# Patient Record
Sex: Male | Born: 1965 | Race: White | Hispanic: No | Marital: Single | Smoking: Former smoker
Health system: Southern US, Community
[De-identification: ages and names within clinical notes are randomized; demographics above are authoritative.]

## PROBLEM LIST (undated history)

## (undated) DIAGNOSIS — R51 Headache: Secondary | ICD-10-CM

## (undated) DIAGNOSIS — M5136 Other intervertebral disc degeneration, lumbar region: Secondary | ICD-10-CM

## (undated) DIAGNOSIS — F909 Attention-deficit hyperactivity disorder, unspecified type: Secondary | ICD-10-CM

## (undated) DIAGNOSIS — K7030 Alcoholic cirrhosis of liver without ascites: Secondary | ICD-10-CM

## (undated) DIAGNOSIS — T7840XA Allergy, unspecified, initial encounter: Secondary | ICD-10-CM

## (undated) DIAGNOSIS — K8020 Calculus of gallbladder without cholecystitis without obstruction: Secondary | ICD-10-CM

## (undated) DIAGNOSIS — E1142 Type 2 diabetes mellitus with diabetic polyneuropathy: Secondary | ICD-10-CM

## (undated) DIAGNOSIS — I10 Essential (primary) hypertension: Secondary | ICD-10-CM

## (undated) DIAGNOSIS — IMO0002 Reserved for concepts with insufficient information to code with codable children: Secondary | ICD-10-CM

## (undated) DIAGNOSIS — M51369 Other intervertebral disc degeneration, lumbar region without mention of lumbar back pain or lower extremity pain: Secondary | ICD-10-CM

## (undated) DIAGNOSIS — R519 Headache, unspecified: Secondary | ICD-10-CM

## (undated) DIAGNOSIS — C801 Malignant (primary) neoplasm, unspecified: Secondary | ICD-10-CM

## (undated) DIAGNOSIS — F329 Major depressive disorder, single episode, unspecified: Secondary | ICD-10-CM

## (undated) DIAGNOSIS — G8929 Other chronic pain: Secondary | ICD-10-CM

## (undated) DIAGNOSIS — F1010 Alcohol abuse, uncomplicated: Secondary | ICD-10-CM

## (undated) DIAGNOSIS — E1165 Type 2 diabetes mellitus with hyperglycemia: Secondary | ICD-10-CM

## (undated) DIAGNOSIS — H3500 Unspecified background retinopathy: Secondary | ICD-10-CM

## (undated) DIAGNOSIS — M4722 Other spondylosis with radiculopathy, cervical region: Secondary | ICD-10-CM

## (undated) DIAGNOSIS — M4810 Ankylosing hyperostosis [Forestier], site unspecified: Secondary | ICD-10-CM

## (undated) DIAGNOSIS — H409 Unspecified glaucoma: Secondary | ICD-10-CM

## (undated) DIAGNOSIS — F411 Generalized anxiety disorder: Secondary | ICD-10-CM

## (undated) HISTORY — PX: KNEE ARTHROSCOPY: SUR90

## (undated) HISTORY — DX: Major depressive disorder, single episode, unspecified: F32.9

## (undated) HISTORY — DX: Unspecified background retinopathy: H35.00

## (undated) HISTORY — DX: Allergy, unspecified, initial encounter: T78.40XA

## (undated) HISTORY — DX: Alcohol abuse, uncomplicated: F10.10

## (undated) HISTORY — DX: Essential (primary) hypertension: I10

## (undated) HISTORY — DX: Generalized anxiety disorder: F41.1

## (undated) HISTORY — DX: Unspecified glaucoma: H40.9

## (undated) HISTORY — DX: Other spondylosis with radiculopathy, cervical region: M47.22

## (undated) HISTORY — DX: Other intervertebral disc degeneration, lumbar region: M51.36

## (undated) HISTORY — DX: Calculus of gallbladder without cholecystitis without obstruction: K80.20

## (undated) HISTORY — DX: Type 2 diabetes mellitus with hyperglycemia: E11.65

## (undated) HISTORY — DX: Type 2 diabetes mellitus with diabetic polyneuropathy: E11.42

## (undated) HISTORY — DX: Other intervertebral disc degeneration, lumbar region without mention of lumbar back pain or lower extremity pain: M51.369

## (undated) HISTORY — DX: Attention-deficit hyperactivity disorder, unspecified type: F90.9

## (undated) HISTORY — DX: Alcoholic cirrhosis of liver without ascites: K70.30

---
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
## 2004-08-27 DIAGNOSIS — Z87820 Personal history of traumatic brain injury: Secondary | ICD-10-CM

## 2004-08-27 HISTORY — DX: Personal history of traumatic brain injury: Z87.820

## 2011-06-30 ENCOUNTER — Emergency Department (HOSPITAL_COMMUNITY): Payer: Self-pay

## 2011-06-30 ENCOUNTER — Emergency Department (HOSPITAL_COMMUNITY)
Admission: EM | Admit: 2011-06-30 | Discharge: 2011-06-30 | Disposition: A | Discharge status: Home / Self Care | Payer: Self-pay | Attending: Emergency Medicine | Admitting: Emergency Medicine

## 2011-06-30 DIAGNOSIS — M79609 Pain in unspecified limb: Secondary | ICD-10-CM | POA: Insufficient documentation

## 2011-06-30 DIAGNOSIS — M25673 Stiffness of unspecified ankle, not elsewhere classified: Secondary | ICD-10-CM | POA: Insufficient documentation

## 2011-06-30 DIAGNOSIS — X500XXA Overexertion from strenuous movement or load, initial encounter: Secondary | ICD-10-CM | POA: Insufficient documentation

## 2011-06-30 DIAGNOSIS — M25676 Stiffness of unspecified foot, not elsewhere classified: Secondary | ICD-10-CM | POA: Insufficient documentation

## 2011-06-30 DIAGNOSIS — S93409A Sprain of unspecified ligament of unspecified ankle, initial encounter: Secondary | ICD-10-CM | POA: Insufficient documentation

## 2011-06-30 DIAGNOSIS — M25579 Pain in unspecified ankle and joints of unspecified foot: Secondary | ICD-10-CM | POA: Insufficient documentation

## 2011-07-16 ENCOUNTER — Emergency Department (INDEPENDENT_AMBULATORY_CARE_PROVIDER_SITE_OTHER): Payer: Self-pay

## 2011-07-16 ENCOUNTER — Emergency Department (INDEPENDENT_AMBULATORY_CARE_PROVIDER_SITE_OTHER)
Admission: EM | Admit: 2011-07-16 | Discharge: 2011-07-16 | Disposition: A | Discharge status: Home / Self Care | Payer: Self-pay | Source: Home / Self Care | Attending: Emergency Medicine | Admitting: Emergency Medicine

## 2011-07-16 DIAGNOSIS — G5630 Lesion of radial nerve, unspecified upper limb: Secondary | ICD-10-CM

## 2011-07-16 DIAGNOSIS — IMO0002 Reserved for concepts with insufficient information to code with codable children: Secondary | ICD-10-CM

## 2011-07-16 DIAGNOSIS — M7580 Other shoulder lesions, unspecified shoulder: Secondary | ICD-10-CM

## 2011-07-16 DIAGNOSIS — M67919 Unspecified disorder of synovium and tendon, unspecified shoulder: Secondary | ICD-10-CM

## 2011-07-16 DIAGNOSIS — M719 Bursopathy, unspecified: Secondary | ICD-10-CM

## 2011-07-16 HISTORY — DX: Other intervertebral disc degeneration, lumbar region: M51.36

## 2011-07-16 HISTORY — DX: Other chronic pain: G89.29

## 2011-07-16 HISTORY — DX: Reserved for concepts with insufficient information to code with codable children: IMO0002

## 2011-07-16 MED ORDER — PREDNISONE 5 MG PO KIT: 1.0000 | PACK | Freq: Every day | ORAL | Status: DC

## 2011-07-16 MED ORDER — HYDROCODONE-ACETAMINOPHEN 5-325 MG PO TABS: ORAL_TABLET | ORAL | Status: AC

## 2011-07-16 MED ORDER — CYCLOBENZAPRINE HCL 5 MG PO TABS: 5.0000 mg | ORAL_TABLET | Freq: Three times a day (TID) | ORAL | Status: AC | PRN

## 2011-07-16 NOTE — ED Notes (Signed)
Patient given discharge instruction by Dr Lorenz Coaster, expressed understanding and had all questions answered.  Pt given RX x 1 and e-prescribed x 2

## 2011-07-16 NOTE — ED Notes (Signed)
Patient has multiple complaints.  He has chronic low back pain and a left hand injury;  Patient states he was arrested 2 1/2 weeks ago and was put into "hinged cuffs"  He has pain and numbness to left hand.  Patient also wants to know why he is having so much pain.  He thinks he has cancer

## 2011-07-16 NOTE — ED Provider Notes (Signed)
History     CSN: [id number] Arrival date & time: 07/16/2011  4:15 PM   First MD Initiated Contact with Patient 07/16/11 1600      Chief Complaint  Patient presents with  . Back Pain  . Hand Injury    numbness    (Consider location/radiation/quality/duration/timing/severity/associated sxs/prior treatment) HPI Comments: Blue presents tonight with multiple musculoskeletal complaints that have been going on for varying times. The first is pain in his left wrist. This occurred after a traffic arrest 2 weeks ago. He was placed in handcuffs for about 30 minutes. Ever since then he's had pain over the radial styloid with numbness and tingling radiating up into the index finger and pain. It's tender to touch. There's been no swelling. It hurts him to move it. And he has diminished strength in that hand.  His second complaint is back pain. He states this involves his entire spine from the base of the skull on down to his pelvis. He's injured his back multiple times. The pain radiates down both legs with numbness and tingling and weakness. About 12-14 weeks ago this flared up again. He usually takes Flexeril and prednisone. He doesn't have a physician to followup with.  His third complaint has been left shoulder pain. This is been going on for 4 weeks. He denies any injury and it hurts with abduction. The pain sometimes radiates down his arm. He also notes crepitus with movement of the shoulder.  Patient is a 45 y.o. male presenting with back pain and hand injury.  Back Pain  Associated symptoms include numbness and weakness.  Hand Injury     Past Medical History  Diagnosis Date  . Chronic pain     back  . Disc degeneration   . Disc degeneration, lumbar     History reviewed. No pertinent past surgical history.  History reviewed. No pertinent family history.  History  Substance Use Topics  . Smoking status: Former Games developer  . Smokeless tobacco: Not on file  . Alcohol Use: Yes       Review of Systems  Musculoskeletal: Positive for back pain and arthralgias. Negative for myalgias, joint swelling and gait problem.  Skin: Negative for rash and wound.  Neurological: Positive for weakness and numbness.    Allergies  Review of patient's allergies indicates not on file.  Home Medications   Current Outpatient Rx  Name Route Sig Dispense Refill  . HYDROCODONE-ACETAMINOPHEN 2.5-500 MG PO TABS Oral Take 1 tablet by mouth every 6 (six) hours as needed.      . IBUPROFEN 800 MG PO TABS Oral Take 800 mg by mouth every 8 (eight) hours as needed.      . CYCLOBENZAPRINE HCL 5 MG PO TABS Oral Take 1 tablet (5 mg total) by mouth 3 (three) times daily as needed for muscle spasms. 30 tablet 0  . HYDROCODONE-ACETAMINOPHEN 5-325 MG PO TABS  1 to 2 tabs every 4 to 6 hours as needed for pain. 20 tablet 0  . PREDNISONE 5 MG PO KIT Oral Take 1 kit (5 mg total) by mouth daily after breakfast. Take as directed. 1 kit 0    BP 157/98  Pulse 91  Temp(Src) 97.8 F (36.6 C) (Oral)  Resp 22  SpO2 98%  Physical Exam  Nursing note and vitals reviewed. Constitutional: He is oriented to person, place, and time. He appears well-developed and well-nourished. No distress.  Musculoskeletal: Normal range of motion. He exhibits tenderness. He exhibits no edema.  Exam of his neck reveals tenderness to palpation over both trapezius ridges. The neck has a full range of motion with pain in all directions.  Exam of the left shoulder reveals no tenderness to palpation. The shoulder has a full range of motion with some crepitus. Impingement signs are positive. Muscle strength is somewhat diminished.  Exam of the wrist reveals pain to palpation over the ulnar styloid and the wrist. The wrist is tender to palpation. Has a diminished range of motion with pain. Muscle strength and sensation are diminished. Pulses are full.  Exam of his back reveals tenderness to palpation up and down the entire  spine and both the table area and the paravertebral muscles. The back has a limited range of motion with pain. Straight leg raising is positive bilaterally. Muscle strengths and DTRs are normal and lower extremities. Sensation is normal in the lower extremities.  Neurological: He is alert and oriented to person, place, and time. He has normal strength and normal reflexes. He displays no atrophy. No sensory deficit. He exhibits normal muscle tone.  Skin: Skin is warm and dry. No rash noted. He is not diaphoretic.    ED Course  Procedures (including critical care time)  Labs Reviewed - No data to display Dg Wrist Complete Left  07/16/2011  *RADIOLOGY REPORT*  Clinical Data: Wrist pain  LEFT WRIST - COMPLETE 3+ VIEW  Comparison: None.  Findings: Normal alignment without fracture.  Intact distal radius, ulna and carpal bones.  No radiographic swelling or foreign body. Preserved joint spaces.  IMPRESSION: No acute finding.  Original Report Authenticated By: Judie Petit. Ruel Favors, M.D.   Dg Shoulder Left  07/16/2011  *RADIOLOGY REPORT*  Clinical Data: Left shoulder pain  LEFT SHOULDER - 2+ VIEW  Comparison: None.  Findings: No evidence for fracture.  No findings to suggest shoulder separation or dislocation. No worrisome lytic or sclerotic osseous lesion.  IMPRESSION: No acute bony findings.  Original Report Authenticated By: Powell A. MANSELL, M.D.     1. Radial nerve palsy   2. Rotator cuff tendonitis   3. Degenerative disc disease       MDM  He has 3 separate problems. The first is a radial nerve injury to his left wrist from having handcuffs on. This should clear up with time.  Second he has degenerative disc disease involving his entire spine. This will continue on. He was given some prednisone and Flexeril for this.   Third heappears to have rotator cuff tendinitis involving his left shoulder  . He'll need a followup with an orthopedist for this.       Roque Lias, MD 07/16/11  [phone]

## 2011-07-23 ENCOUNTER — Encounter (HOSPITAL_COMMUNITY): Payer: Self-pay | Admitting: *Deleted

## 2011-08-10 ENCOUNTER — Emergency Department (INDEPENDENT_AMBULATORY_CARE_PROVIDER_SITE_OTHER)
Admission: EM | Admit: 2011-08-10 | Discharge: 2011-08-10 | Disposition: A | Discharge status: Home / Self Care | Payer: Self-pay | Source: Home / Self Care | Attending: Family Medicine | Admitting: Family Medicine

## 2011-08-10 ENCOUNTER — Encounter (HOSPITAL_COMMUNITY): Payer: Self-pay

## 2011-08-10 DIAGNOSIS — M545 Low back pain: Secondary | ICD-10-CM

## 2011-08-10 MED ORDER — MELOXICAM 15 MG PO TABS: 15.0000 mg | ORAL_TABLET | Freq: Every day | ORAL | Status: AC

## 2011-08-10 MED ORDER — HYDROCODONE-ACETAMINOPHEN 5-500 MG PO TABS: 1.0000 | ORAL_TABLET | Freq: Four times a day (QID) | ORAL | Status: AC | PRN

## 2011-08-10 MED ORDER — PREDNISONE 50 MG PO TABS: 50.0000 mg | ORAL_TABLET | Freq: Every day | ORAL | Status: DC

## 2011-08-10 MED ORDER — CYCLOBENZAPRINE HCL 10 MG PO TABS: 10.0000 mg | ORAL_TABLET | Freq: Three times a day (TID) | ORAL | Status: AC | PRN

## 2011-08-10 NOTE — ED Provider Notes (Signed)
Gerald Olson is a 45 year old male who presents to the clinic tonight with an exacerbation of his chronic low back pain. He was seen in the urgent care center in mid November for a similar episode there he was provided prednisone hydrocodone and ibuprofen which helped well. However one week ago he had return of bilateral lumbar pain with radiating down his right leg.  He denies any weakness or bowel or bladder dysfunction but does note the pain is quite debilitating.  He is currently in between doctors in attempting to establish care at either this [facility] or the internal medicine Center with the community care program.  He has been taking ibuprofen which is not helping much.     PMH reviewed.  ROS as above otherwise neg Medications reviewed. No current facility-administered medications for this encounter.   Current Outpatient Prescriptions  Medication Sig Dispense Refill  . cyclobenzaprine (FLEXERIL) 10 MG tablet Take 1 tablet (10 mg total) by mouth 3 (three) times daily as needed for muscle spasms.  60 tablet  1  . HYDROcodone-acetaminophen (VICODIN) 5-500 MG per tablet Take 1 tablet by mouth every 6 (six) hours as needed for pain.  20 tablet  0  . meloxicam (MOBIC) 15 MG tablet Take 1 tablet (15 mg total) by mouth daily.  15 tablet  0  . predniSONE (DELTASONE) 50 MG tablet Take 1 tablet (50 mg total) by mouth daily.  5 tablet  0   Exam:  BP 131/94  Pulse 99  Temp(Src) 99.2 F (37.3 C) (Oral)  Resp 16  SpO2 97% Gen: Well NAD, in pain appearing Lungs: CTABL Nl WOB Heart: RRR no MRG Exts: Non edematous BL  LE, warm and well perfused.  MSK: Nontender over spinal midline. Right sided SI joint tenderness to palpation. Patient is able to walk and get up and down from exam table by himself but with some pain.   Neuro: Alert and oriented x3 strength intact in all extremities and reflexes equal in all extremities.  Sensation preserved throughout.  Assessment and  plan: 45 year old male with acute low back pain in the setting of chronic low back pain. I believe that Gerald Olson has pain tonight.  We look them up in the [facility] drug database and is only controlled substance fell in the last year or was from the urgent care Center one month ago.   Plan to provide prednisone burst, Flexeril, meloxicam and 20 tablets of Vicodin. Discuss red flag signs or symptoms with patient who expresses understanding.  Elevated blood pressure: Likely due to NSAIDs as previously was well-controlled. It is mild currently.  We'll followup with primary care provider when he gets established.  Clementeen Graham 08/10/11 1814

## 2011-08-10 NOTE — ED Notes (Addendum)
Discharge note should read that meloxicam and flexeril e-prescribed - not prednisone and ibuprofen.  Hydrocodone rx was given to pt.

## 2011-08-10 NOTE — ED Notes (Signed)
C/o chronic neck and low back pain.  States currently having a flare of neck and back pain.  States back pain radiates to legs and neck pain to arms.

## 2017-12-03 ENCOUNTER — Emergency Department (HOSPITAL_COMMUNITY)
Admission: EM | Admit: 2017-12-03 | Discharge: 2017-12-03 | Disposition: A | Discharge status: Home / Self Care | Payer: Self-pay | Attending: Emergency Medicine | Admitting: Emergency Medicine

## 2017-12-03 ENCOUNTER — Encounter (HOSPITAL_COMMUNITY): Payer: Self-pay

## 2017-12-03 ENCOUNTER — Emergency Department (HOSPITAL_COMMUNITY): Payer: Self-pay

## 2017-12-03 DIAGNOSIS — W19XXXA Unspecified fall, initial encounter: Secondary | ICD-10-CM | POA: Insufficient documentation

## 2017-12-03 DIAGNOSIS — Y998 Other external cause status: Secondary | ICD-10-CM | POA: Insufficient documentation

## 2017-12-03 DIAGNOSIS — Z87891 Personal history of nicotine dependence: Secondary | ICD-10-CM | POA: Insufficient documentation

## 2017-12-03 DIAGNOSIS — Y92019 Unspecified place in single-family (private) house as the place of occurrence of the external cause: Secondary | ICD-10-CM | POA: Insufficient documentation

## 2017-12-03 DIAGNOSIS — M545 Low back pain: Secondary | ICD-10-CM | POA: Insufficient documentation

## 2017-12-03 DIAGNOSIS — G8929 Other chronic pain: Secondary | ICD-10-CM | POA: Insufficient documentation

## 2017-12-03 DIAGNOSIS — Y939 Activity, unspecified: Secondary | ICD-10-CM | POA: Insufficient documentation

## 2017-12-03 MED ORDER — METHOCARBAMOL 500 MG PO TABS
500.0000 mg | ORAL_TABLET | Freq: Once | ORAL | Status: AC
  Administered 2017-12-03: 500 mg via ORAL
  Filled 2017-12-03: qty 1

## 2017-12-03 MED ORDER — KETOROLAC TROMETHAMINE 30 MG/ML IJ SOLN
30.0000 mg | Freq: Once | INTRAMUSCULAR | Status: AC
  Administered 2017-12-03: 30 mg via INTRAMUSCULAR
  Filled 2017-12-03: qty 1

## 2017-12-03 MED ORDER — METHOCARBAMOL 500 MG PO TABS: 500.0000 mg | ORAL_TABLET | Freq: Two times a day (BID) | ORAL | 0 refills | Status: DC

## 2017-12-03 MED ORDER — TRAMADOL HCL 50 MG PO TABS: 50.0000 mg | ORAL_TABLET | Freq: Four times a day (QID) | ORAL | 0 refills | Status: DC | PRN

## 2017-12-03 NOTE — ED Notes (Signed)
Patient transported to X-ray 

## 2017-12-03 NOTE — Discharge Instructions (Signed)
Please read attached information. If you experience any new or worsening signs or symptoms please return to the emergency room for evaluation. Please follow-up with your primary care provider or specialist as discussed. Please use medication prescribed only as directed and discontinue taking if you have any concerning signs or symptoms.   °

## 2017-12-03 NOTE — ED Provider Notes (Signed)
[facility] EMERGENCY DEPARTMENT Provider Note   CSN: [id number] Arrival date & time: 12/03/17  0900     History   Chief Complaint No chief complaint on file.   HPI Gerald Olson is a 52 y.o. male.  HPI   52 year old male presents today with complaints of lower back pain.  Patient reports chronic lower back pain that has been worsening over the last 5 months and acutely worsening over the last 2.  Patient reports he is being seen as an outpatient for disability and has outpatient follow-up with neurosurgery in the works.  Patient notes that he has weakness and loss of sensation in the bilateral lower legs this is chronic in nature.  He reports he uses a cane at home.  He notes several days ago he stumbled and fell back hitting his lower back causing worsening of his chronic pain.  Patient notes using devils claw and naproxen at home as needed for discomfort.  Patient denies any fever, bowel bladder changes, or any acute neurological changes from his baseline.  His last x-ray was approximately 2 years ago.   Past Medical History:  Diagnosis Date  . Chronic pain    back  . Disc degeneration   . Disc degeneration, lumbar     There are no active problems to display for this patient.   History reviewed. No pertinent surgical history.      Home Medications    Prior to Admission medications   Medication Sig Start Date End Date Taking? Authorizing Provider  methocarbamol (ROBAXIN) 500 MG tablet Take 1 tablet (500 mg total) by mouth 2 (two) times daily. 12/03/17   Salim Forero, Dellis Filbert, PA-C  predniSONE (DELTASONE) 50 MG tablet Take 1 tablet (50 mg total) by mouth daily. 08/10/11   Gregor Hams, MD  traMADol (ULTRAM) 50 MG tablet Take 1 tablet (50 mg total) by mouth every 6 (six) hours as needed. 12/03/17   Okey Regal, PA-C    Family History No family history on file.  Social History Social History   Tobacco Use  . Smoking status: Former Smoker  Substance Use  Topics  . Alcohol use: Yes  . Drug use: No     Allergies   Patient has no known allergies.   Review of Systems Review of Systems  All other systems reviewed and are negative.  Physical Exam Updated Vital Signs BP (!) 164/96 (BP Location: Left Arm)   Pulse 87   Temp 98 F (36.7 C) (Oral)   Resp 18   SpO2 98%   Physical Exam  Constitutional: He is oriented to person, place, and time. He appears well-developed and well-nourished.  HENT:  Head: Normocephalic and atraumatic.  Eyes: Pupils are equal, round, and reactive to light. Conjunctivae are normal. Right eye exhibits no discharge. Left eye exhibits no discharge. No scleral icterus.  Neck: Normal range of motion. No JVD present. No tracheal deviation present.  Pulmonary/Chest: Effort normal. No stridor.  Musculoskeletal:  Tenderness palpation left lateral lumbar soft tissue straight leg negative bilateral, decreased sensation diffusely to bilateral lower extremities, strength 5 out of 5 to lower extremities, no patellar reflexes bilateral(normal per patient)  Neurological: He is alert and oriented to person, place, and time. Coordination normal.  Psychiatric: He has a normal mood and affect. His behavior is normal. Judgment and thought content normal.  Nursing note and vitals reviewed.    ED Treatments / Results  Labs (all labs ordered are listed, but only abnormal results  are displayed) Labs Reviewed - No data to display  EKG None  Radiology Dg Lumbar Spine Complete  Result Date: 12/03/2017 CLINICAL DATA:  Worsening low back pain after a recent fall. Initial encounter. EXAM: LUMBAR SPINE - COMPLETE 4+ VIEW COMPARISON:  None. FINDINGS: There are 5 non rib-bearing lumbar type vertebrae. There is grade 1 anterolisthesis of L5 on S1 which appears degenerative and facet mediated without definite pars defects identified. Vertebral body heights are preserved. No fracture is identified. Lumbar disc space heights are preserved  aside from moderate narrowing at L5-S1. There is lower thoracic spondylosis with relatively bulky anterior vertebral spurring from T9-10 to T11-12. The regional soft tissues are unremarkable. IMPRESSION: 1. No acute osseous abnormality identified. 2. L5-S1 disc and facet degeneration with grade 1 anterolisthesis. Electronically Signed   By: Logan Bores M.D.   On: 12/03/2017 14:13   Dg Sacrum/coccyx  Result Date: 12/03/2017 CLINICAL DATA:  Low back pain EXAM: SACRUM AND COCCYX - 2+ VIEW COMPARISON:  None. FINDINGS: Moderate degenerative changes at L5-S1. No acute fracture or malalignment. Pubic symphysis is intact. IMPRESSION: Negative.  Degenerative changes at L5-S1. Electronically Signed   By: Donavan Foil M.D.   On: 12/03/2017 14:16    Procedures Procedures (including critical care time)  Medications Ordered in ED Medications  ketorolac (TORADOL) 30 MG/ML injection 30 mg (30 mg Intramuscular Given 12/03/17 1257)  methocarbamol (ROBAXIN) tablet 500 mg (500 mg Oral Given 12/03/17 1257)     Initial Impression / Assessment and Plan / ED Course  I have reviewed the triage vital signs and the nursing notes.  Pertinent labs & imaging results that were available during my care of the patient were reviewed by me and considered in my medical decision making (see chart for details).       Final Clinical Impressions(s) / ED Diagnoses   Final diagnoses:  Chronic low back pain, unspecified back pain laterality, with sciatica presence unspecified    Labs:   Imaging: DG lumbar spine complete  Consults:  Therapeutics: Robaxin, Toradol  Discharge Meds: Robaxin, Ultram  Assessment/Plan: 52 year old male presents today with complaints of chronic lower back pain.  Neurologically speaking patient is at his baseline with no acute changes.  Patient does have difficulty ambulating secondary to pain.  Given patient's chronic nature with no significant changes I do find him appropriate for outpatient  follow-up.  Patient is already under the care of outside physician and they are working on appropriate follow-up.  Patient will be given Robaxin, Ultram, and strict return precautions, he verbalizes understanding and agreement to today's plan had no further questions or concerns.    ED Discharge Orders        Ordered    methocarbamol (ROBAXIN) 500 MG tablet  2 times daily     12/03/17 1454    traMADol (ULTRAM) 50 MG tablet  Every 6 hours PRN     12/03/17 1454       Francee Gentile 12/03/17 1535    Daleen Bo, MD 12/03/17 2159

## 2017-12-03 NOTE — ED Triage Notes (Signed)
Patient complains of lower back pain that has worsened since fall on Sunday am. No loc. Complains of increased pain to sacrum and coccyx, pain severe with ambuation

## 2018-04-07 ENCOUNTER — Ambulatory Visit: Payer: Self-pay | Attending: Family Medicine | Admitting: Licensed Clinical Social Worker

## 2018-04-07 DIAGNOSIS — Z599 Problem related to housing and economic circumstances, unspecified: Secondary | ICD-10-CM

## 2018-04-07 DIAGNOSIS — Z598 Other problems related to housing and economic circumstances: Secondary | ICD-10-CM

## 2018-04-07 DIAGNOSIS — F439 Reaction to severe stress, unspecified: Secondary | ICD-10-CM
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
## 2018-04-14 ENCOUNTER — Ambulatory Visit: Payer: Self-pay | Attending: Family Medicine | Admitting: Licensed Clinical Social Worker

## 2018-04-14 ENCOUNTER — Ambulatory Visit: Payer: Self-pay | Attending: Family Medicine

## 2018-04-14 ENCOUNTER — Encounter: Payer: Self-pay | Admitting: Family Medicine

## 2018-04-14 ENCOUNTER — Ambulatory Visit: Payer: Self-pay | Attending: Family Medicine | Admitting: Family Medicine

## 2018-04-14 VITALS — BP 160/98 | HR 86 | Temp 99.0°F | Resp 18 | Ht 74.0 in | Wt 243.0 lb

## 2018-04-14 DIAGNOSIS — F329 Major depressive disorder, single episode, unspecified: Secondary | ICD-10-CM

## 2018-04-14 DIAGNOSIS — R1011 Right upper quadrant pain: Secondary | ICD-10-CM

## 2018-04-14 DIAGNOSIS — R251 Tremor, unspecified: Secondary | ICD-10-CM | POA: Insufficient documentation

## 2018-04-14 DIAGNOSIS — T25322A Burn of third degree of left foot, initial encounter: Secondary | ICD-10-CM

## 2018-04-14 DIAGNOSIS — M5442 Lumbago with sciatica, left side: Secondary | ICD-10-CM

## 2018-04-14 DIAGNOSIS — G629 Polyneuropathy, unspecified: Secondary | ICD-10-CM

## 2018-04-14 DIAGNOSIS — F322 Major depressive disorder, single episode, severe without psychotic features: Secondary | ICD-10-CM

## 2018-04-14 DIAGNOSIS — F419 Anxiety disorder, unspecified: Secondary | ICD-10-CM

## 2018-04-14 DIAGNOSIS — T25222A Burn of second degree of left foot, initial encounter: Secondary | ICD-10-CM

## 2018-04-14 DIAGNOSIS — G8929 Other chronic pain: Secondary | ICD-10-CM

## 2018-04-14 DIAGNOSIS — X12XXXA Contact with other hot fluids, initial encounter: Secondary | ICD-10-CM | POA: Insufficient documentation

## 2018-04-14 DIAGNOSIS — F17290 Nicotine dependence, other tobacco product, uncomplicated: Secondary | ICD-10-CM | POA: Insufficient documentation

## 2018-04-14 DIAGNOSIS — M5441 Lumbago with sciatica, right side: Secondary | ICD-10-CM

## 2018-04-14 DIAGNOSIS — F32A Depression, unspecified: Secondary | ICD-10-CM

## 2018-04-14 MED ORDER — CELECOXIB 100 MG PO CAPS: 100.0000 mg | ORAL_CAPSULE | Freq: Two times a day (BID) | ORAL | 3 refills | Status: DC

## 2018-04-14 MED ORDER — CEPHALEXIN 500 MG PO CAPS: 500.0000 mg | ORAL_CAPSULE | Freq: Three times a day (TID) | ORAL | 0 refills | Status: AC

## 2018-04-14 MED ORDER — TRAMADOL HCL 50 MG PO TABS: 50.0000 mg | ORAL_TABLET | Freq: Four times a day (QID) | ORAL | 0 refills | Status: AC | PRN

## 2018-04-14 MED FILL — CELECOXIB 100 MG CAP: 100 | 30 days supply | Qty: 60 | Fill #0

## 2018-04-14 MED FILL — CEPHALEXIN 500 MG CAPSULE: 500 | 10 days supply | Qty: 30 | Fill #0

## 2018-04-14 MED FILL — traMADol HCL 50 MG TABS: 50 | 6 days supply | Qty: 20 | Fill #0

## 2018-04-14 NOTE — Progress Notes (Signed)
   Subjective:            Objective:   Physical Exam  Assessment & Plan:

## 2018-04-14 NOTE — Patient Instructions (Signed)
Burn Care, Adult A burn is an injury to the skin or the tissues under the skin. There are three types of burns:  First degree. These burns may cause the skin to be red and a bit swollen.  Second degree. These burns are very painful and cause the skin to be very red. The skin may also leak fluid, look shiny, and start to have blisters.  Third degree. These burns cause permanent damage. They turn the skin white or black and make it look charred, dry, and leathery.  Taking care of your burn properly can help to prevent pain and infection. It can also help the burn to heal more quickly. How is this treated? Right after a burn:  Rinse or soak the burn under cool water. Do this for several minutes. Do not put ice on your burn. That can cause more damage.  Lightly cover the burn with a clean (sterile) cloth (dressing). Burn care  Raise (elevate) the injured area above the level of your heart while sitting or lying down.  Follow instructions from your doctor about: ? How to clean and take care of the burn. ? When to change and remove the cloth.  Check your burn every day for signs of infection. Check for: ? More redness, swelling, or pain. ? Warmth. ? Pus or a bad smell. Medicine   Take over-the-counter and prescription medicines only as told by your doctor.  If you were prescribed antibiotic medicine, take or apply it as told by your doctor. Do not stop using the antibiotic even if your condition improves. General instructions  To prevent infection: ? Do not put butter, oil, or other home treatments on the burn. ? Do not scratch or pick at the burn. ? Do not break any blisters. ? Do not peel skin.  Do not rub your burn, even when you are cleaning it.  Protect your burn from the sun. Contact a doctor if:  Your condition does not get better.  Your condition gets worse.  You have a fever.  Your burn looks different or starts to have black or red spots on it.  Your burn  feels warm to the touch.  Your pain is not controlled with medicine. Get help right away if:  You have redness, swelling, or pain at the site of the burn.  You have fluid, blood, or pus coming from your burn.  You have red streaks near the burn.  You have very bad pain. This information is not intended to replace advice given to you by your health care provider. Make sure you discuss any questions you have with your health care provider. Document Released: 05/22/2008 Document Revised: 09/29/2016 Document Reviewed: 01/31/2016 Elsevier Interactive Patient Education  2018 [employer]  
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
## 2018-04-15 LAB — COMPREHENSIVE METABOLIC PANEL WITH GFR
ALT: 42 IU/L (ref 0–44)
AST: 44 IU/L — ABNORMAL HIGH (ref 0–40)
Albumin/Globulin Ratio: 1.3 (ref 1.2–2.2)
Albumin: 4 g/dL (ref 3.5–5.5)
Alkaline Phosphatase: 123 IU/L — ABNORMAL HIGH (ref 39–117)
BUN/Creatinine Ratio: 15 (ref 9–20)
BUN: 15 mg/dL (ref 6–24)
Bilirubin Total: 0.4 mg/dL (ref 0.0–1.2)
CO2: 24 mmol/L (ref 20–29)
Calcium: 9.2 mg/dL (ref 8.7–10.2)
Chloride: 97 mmol/L (ref 96–106)
Creatinine, Ser: 0.97 mg/dL (ref 0.76–1.27)
GFR calc Af Amer: 104 mL/min/1.73
GFR calc non Af Amer: 90 mL/min/1.73
Globulin, Total: 3 g/dL (ref 1.5–4.5)
Glucose: 358 mg/dL — ABNORMAL HIGH (ref 65–99)
Potassium: 4.8 mmol/L (ref 3.5–5.2)
Sodium: 137 mmol/L (ref 134–144)
Total Protein: 7 g/dL (ref 6.0–8.5)

## 2018-04-15 LAB — CBC WITH DIFFERENTIAL/PLATELET
Basophils Absolute: 0 x10E3/uL (ref 0.0–0.2)
Basos: 1 %
EOS (ABSOLUTE): 0.2 x10E3/uL (ref 0.0–0.4)
Eos: 3 %
Hematocrit: 44.3 % (ref 37.5–51.0)
Hemoglobin: 14.1 g/dL (ref 13.0–17.7)
Immature Grans (Abs): 0 x10E3/uL (ref 0.0–0.1)
Immature Granulocytes: 0 %
Lymphocytes Absolute: 1.9 x10E3/uL (ref 0.7–3.1)
Lymphs: 28 %
MCH: 28.1 pg (ref 26.6–33.0)
MCHC: 31.8 g/dL (ref 31.5–35.7)
MCV: 88 fL (ref 79–97)
Monocytes Absolute: 0.8 x10E3/uL (ref 0.1–0.9)
Monocytes: 11 %
Neutrophils Absolute: 3.9 x10E3/uL (ref 1.4–7.0)
Neutrophils: 57 %
Platelets: 260 x10E3/uL (ref 150–450)
RBC: 5.01 x10E6/uL (ref 4.14–5.80)
RDW: 13.3 % (ref 12.3–15.4)
WBC: 6.8 x10E3/uL (ref 3.4–10.8)

## 2018-04-15 NOTE — BH Specialist Note (Signed)
Integrated Behavioral Health Follow Up Visit  MRN: [id number] Name: Gerald Olson  Number of [place] Clinician visits: 2/6 Session Start time: 10:30 AM  Session End time: 11:00 AM Total time: 30 minutes  Type of Service: [place] Interpretor:No. Interpretor Name and Language: N/A  SUBJECTIVE: Gerald Olson is a 52 y.o. male accompanied by self Patient was referred by Dr. Chapman Fitch for depression and anxiety. Patient reports the following symptoms/concerns: feelings of sadness and worry, difficulty sleeping, low energy, feeling bad about self, decreased concentration, inability to relax, irritability, and feelings of being "impartial to death" Duration of problem: Over a year; Severity of problem: severe  OBJECTIVE: Mood: Anxious and Affect: Appropriate Risk of harm to self or others: No plan to harm self or others Pt denies current SI/HI/AVH. Reports, "I'm not suicidal. I'm just impartial to death" Protective factors were discussed and safety plan was provided, in addition, to crisis intervention resources  LIFE CONTEXT: Family and Social: Pt resides alone. He has friends who do not reside locally; however, are accessible to him when needed School/Work: Pt is currently unemployed due to ongoing back pain. He has been denied medicaid; however, is interested in appealing decision Self-Care: Pt denied substance use. He enjoys research and has hopes of furthering his education in the future Life Changes: Pt has ongoing medical conditions and reports ongoing chronic pain. He is grieving the loss of his support animal and is in the process of losing his home due to financial strain.  GOALS ADDRESSED: Patient will: 1.  Reduce symptoms of: anxiety, depression and stress  2.  Increase knowledge and/or ability of: coping skills and self-management skills  3.  Demonstrate ability to: Increase healthy adjustment to current life circumstances and  Increase adequate support systems for patient/family  INTERVENTIONS: Interventions utilized:  Mindfulness or Relaxation Training Standardized Assessments completed: GAD-7 and PHQ 2&9 with C-SSRS  ASSESSMENT: Patient currently experiencing anxiety and depression symptoms triggered by ongoing medical conditions, chronic pain, and financial strain. He is grieving the loss of his support animal and is in the process of losing his home. Pt reports feelings of sadness and worry, difficulty sleeping, low energy, feeling bad about self, decreased concentration, inability to relax, and irritability. Pt denies current SI/HI/AVH. Reports, "I'm not suicidal. I'm just impartial to death" Protective factors were discussed and safety plan was provided, in addition, to crisis intervention resources  Patient may benefit from psychotherapy and medication management. LCSWA introduced mindfulness interventions to assist in management of chronic pain and promote relaxation. He is not currently interested in medication management or psychotherapy at this time.  PLAN: 1. Follow up with behavioral health clinician on : Pt was encouraged tocontact LCSWA if symptoms worsen or fail to improveto schedule behavioral appointments at [facility]. 2. Behavioral recommendations: LCSWA recommends that pt apply healthy coping skills discussed and utilize provided resources. Pt is encouraged to schedule follow up appointment with LCSWA 3. Referral(s): [place] (In Clinic) 4. "From scale of 1-10, how likely are you to follow plan?":   Rebekah Chesterfield, LCSW 04/15/18 5:21 PM

## 2018-04-17 ENCOUNTER — Telehealth: Payer: Self-pay | Admitting: *Deleted

## 2018-04-17 NOTE — Telephone Encounter (Signed)
Pt name and DOB verified. He was informed of lab results and result message.   He states he feels like entire torso area is on fire. Not as worse as T6 back pain that is chronic. He also c/o pain above his kidney area. Denies N/V, chest pain, SOB. He is sweating because there is no AC in his home.  He plans to have [place] tomorrow.  He was instructed to go to the ED for acute abdominal pain or if he feels worse: N/V, chest pain to have further evaluation. NAD, SOB. Patient is able to speak in full sentences.  He states his foot is getting better, continuing to take ATB.   Notes recorded by Antony Blackbird, MD on 04/15/2018 at 7:18 PM EDT Notify patient that his CMP showed that his blood sugar was elevated at 358-find out what his current home blood sugar reading is at this time when you talk with him. Also he has a mild increase in two liver enzymes Alk phos at 123 (normal 39-117) and AST at 40 (normal 0-40). His CBC (complete blood count) was normal. He should get his right upper abdomen US done. Go to ED if any acute abdominal pain or any concerns.

## 2018-04-18 ENCOUNTER — Ambulatory Visit (HOSPITAL_COMMUNITY)
Admission: RE | Admit: 2018-04-18 | Discharge: 2018-04-18 | Disposition: A | Discharge status: Home / Self Care | Payer: Self-pay | Source: Ambulatory Visit | Attending: Family Medicine | Admitting: Family Medicine

## 2018-04-18 DIAGNOSIS — R1011 Right upper quadrant pain: Secondary | ICD-10-CM | POA: Insufficient documentation

## 2018-04-18 DIAGNOSIS — K8020 Calculus of gallbladder without cholecystitis without obstruction: Secondary | ICD-10-CM | POA: Insufficient documentation

## 2018-04-23 ENCOUNTER — Telehealth: Payer: Self-pay

## 2018-04-23 NOTE — Telephone Encounter (Signed)
Patient was called, answered, verified DOB and was informed of most recent results. Patient verbalized understanding but was wondering if pcp thinks he should be seen sooner due to the pain her is having in abdominal area. Patient stated he has been trying to hold off for appointment that is 04/25/18 instead of going to urgent care.

## 2018-04-24 NOTE — Telephone Encounter (Signed)
If patient is having a new problem then he should call in for a sooner appointment

## 2018-04-25 ENCOUNTER — Ambulatory Visit: Payer: Self-pay | Attending: Internal Medicine | Admitting: Internal Medicine

## 2018-04-25 ENCOUNTER — Ambulatory Visit: Payer: Self-pay | Admitting: Family Medicine

## 2018-04-25 ENCOUNTER — Encounter: Payer: Self-pay | Admitting: Internal Medicine

## 2018-04-25 VITALS — BP 133/89 | HR 102 | Temp 98.9°F | Resp 16 | Wt 235.4 lb

## 2018-04-25 DIAGNOSIS — R1011 Right upper quadrant pain: Secondary | ICD-10-CM

## 2018-04-25 DIAGNOSIS — F1010 Alcohol abuse, uncomplicated: Secondary | ICD-10-CM

## 2018-04-25 DIAGNOSIS — Z7984 Long term (current) use of oral hypoglycemic drugs: Secondary | ICD-10-CM | POA: Insufficient documentation

## 2018-04-25 DIAGNOSIS — K8020 Calculus of gallbladder without cholecystitis without obstruction: Secondary | ICD-10-CM | POA: Insufficient documentation

## 2018-04-25 DIAGNOSIS — Z791 Long term (current) use of non-steroidal anti-inflammatories (NSAID): Secondary | ICD-10-CM | POA: Insufficient documentation

## 2018-04-25 DIAGNOSIS — E1165 Type 2 diabetes mellitus with hyperglycemia: Secondary | ICD-10-CM | POA: Insufficient documentation

## 2018-04-25 DIAGNOSIS — Z79899 Other long term (current) drug therapy: Secondary | ICD-10-CM | POA: Insufficient documentation

## 2018-04-25 DIAGNOSIS — Z87891 Personal history of nicotine dependence: Secondary | ICD-10-CM | POA: Insufficient documentation

## 2018-04-25 DIAGNOSIS — M545 Low back pain: Secondary | ICD-10-CM | POA: Insufficient documentation

## 2018-04-25 LAB — POCT GLYCOSYLATED HEMOGLOBIN (HGB A1C): HbA1c, POC (controlled diabetic range): 9.2 % — AB (ref 0.0–7.0)

## 2018-04-25 MED ORDER — TETANUS-DIPHTH-ACELL PERTUSSIS 5-2.5-18.5 LF-MCG/0.5 IM SUSP: 0.5000 mL | Freq: Once | INTRAMUSCULAR | 0 refills | Status: AC

## 2018-04-25 MED ORDER — TRUE METRIX METER W/DEVICE KIT: PACK | 0 refills | Status: DC

## 2018-04-25 MED ORDER — GLUCOSE BLOOD VI STRP: ORAL_STRIP | 12 refills | Status: DC

## 2018-04-25 MED ORDER — GLIMEPIRIDE 2 MG PO TABS: 2.0000 mg | ORAL_TABLET | Freq: Every day | ORAL | 3 refills | Status: DC

## 2018-04-25 MED ORDER — TRUEPLUS LANCETS 28G MISC: 1 refills | Status: DC

## 2018-04-25 NOTE — Telephone Encounter (Signed)
Patient had an appointment today 04/25/18, pcp is out of the office. Patient was called to reschedule appointment, no answer, lvm to reschedule.
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
## 2018-04-25 NOTE — Progress Notes (Signed)
Patient ID: Gerald Olson, male    DOB: 08-Nov-1965  MRN: [id number]  CC: Follow-up (1 week)   Subjective: Gerald Olson is a 52 y.o. male who presents for UC visit. Pt was scheduled to see hs PCP Dr. Chapman Fitch but she is out sick today. His concerns today include:   On last visit with his PCP, patient complained of onset of right upper quadrant abdominal pain and feeling a bulge in his right upper abdomen.  LFTs revealed mild elevation in AST and alk phos.  Right upper quadrant ultrasound revealed gallstones without dilation of ducts or signs of acute cholecystitis and some changes suggestive of steatosis.  -Today he reports that he was not having pain in the right upper quadrant until the day after he had the ultrasound.  Pain is associated with some nausea.  He has not had any vomiting.  Felt sweaty but no fever.  He drinks 3-4 12 ounce beers and 1 to 2 glasses of wine a day.  States that he drinks because of the pain in his lower back.  Dx with DM 10 yrs ago.  Was on Metformin but had a lot of S.E. he found a natural product which he started taking with good results.  He reportedly lose weight and changed his eating habits.  He does not have a device to check his blood sugar.  Blood sugar on chemistry was over 300.  No polyuria or dipsia.  Some numbness in feet which he attributes to DDD in his lower back.    -endorses blurred vision.  Last eye exam was 2 yrs ago.     Current Outpatient Medications on File Prior to Visit  Medication Sig Dispense Refill  . celecoxib (CELEBREX) 100 MG capsule Take 1 capsule (100 mg total) by mouth 2 (two) times daily. 60 capsule 3  . ibuprofen (ADVIL,MOTRIN) 800 MG tablet Take 800 mg by mouth every 6 (six) hours as needed.    . methocarbamol (ROBAXIN) 500 MG tablet Take 1 tablet (500 mg total) by mouth 2 (two) times daily. 20 tablet 0  . naproxen (NAPROSYN) 500 MG tablet Take 500 mg by mouth 2 (two) times daily with a meal.     No current facility-administered  medications on file prior to visit.     No Known Allergies  Social History   Socioeconomic History  . Marital status: Single    Spouse name: Not on file  . Number of children: Not on file  . Years of education: Not on file  . Highest education level: Not on file  Occupational History  . Not on file  Social Needs  . Financial resource strain: Not on file  . Food insecurity:    Worry: Not on file    Inability: Not on file  . Transportation needs:    Medical: Not on file    Non-medical: Not on file  Tobacco Use  . Smoking status: Former Research scientist (life sciences)  . Smokeless tobacco: Never Used  Substance and Sexual Activity  . Alcohol use: Yes    Comment: cut back  . Drug use: No  . Sexual activity: Yes  Lifestyle  . Physical activity:    Days per week: Not on file    Minutes per session: Not on file  . Stress: Not on file  Relationships  . Social connections:    Talks on phone: Not on file    Gets together: Not on file    Attends religious service: Not on  file    Active member of club or organization: Not on file    Attends meetings of clubs or organizations: Not on file    Relationship status: Not on file  . Intimate partner violence:    Fear of current or ex partner: Not on file    Emotionally abused: Not on file    Physically abused: Not on file    Forced sexual activity: Not on file  Other Topics Concern  . Not on file  Social History Narrative  . Not on file    No family history on file.  No past surgical history on file.  ROS: Review of Systems Negative except as stated above. PHYSICAL EXAM: BP 133/89   Pulse (!) 102   Temp 98.9 F (37.2 C) (Oral)   Resp 16   Wt 235 lb 6.4 oz (106.8 kg)   SpO2 96%   BMI 30.22 kg/m   Physical Exam General appearance - alert, well appearing, middle-aged Caucasian male and in no distress Mental status - normal mood, behavior, speech, dress, motor activity, and thought processes Abdomen - soft, nontender, nondistended, no  masses or organomegaly  Results for orders placed or performed in visit on 04/25/18  POCT glycosylated hemoglobin (Hb A1C)  Result Value Ref Range   Hemoglobin A1C     HbA1c POC (<> result, manual entry)     HbA1c, POC (prediabetic range)     HbA1c, POC (controlled diabetic range) 9.2 (A) 0.0 - 7.0 %     Chemistry      Component Value Date/Time   NA 137 04/14/2018 1108   K 4.8 04/14/2018 1108   CL 97 04/14/2018 1108   CO2 24 04/14/2018 1108   BUN 15 04/14/2018 1108   CREATININE 0.97 04/14/2018 1108      Component Value Date/Time   CALCIUM 9.2 04/14/2018 1108   ALKPHOS 123 (H) 04/14/2018 1108   AST 44 (H) 04/14/2018 1108   ALT 42 04/14/2018 1108   BILITOT 0.4 04/14/2018 1108      ASSESSMENT AND PLAN: 1. Right upper quadrant abdominal pain Exam today is benign.  He does have gallstones noted on ultrasound and he may be experiencing biliary colic.  Advised him to apply for the orange card/cone discount in the event that his PCP decides to refer him to a surgeon.  If any worsening of symptoms over the weekend patient will be seen in the emergency room.  I will also check a lipase level given his history of daily EtOH use.  Strongly encourage him back as he is not drinking more than the daily recommended amount for a male - Lipase - Hepatic Function Panel  2. Uncontrolled type 2 diabetes mellitus with hyperglycemia ([place]) Dietary counseling discussed.  Patient agreeable to starting Amaryl.  Prescriptions given for diabetic testing supplies.  Encourage to check blood sugars daily. - POCT glycosylated hemoglobin (Hb A1C) - Blood Glucose Monitoring Suppl (TRUE METRIX METER) w/Device KIT; Use as directed  Dispense: 1 kit; Refill: 0 - glucose blood (TRUE METRIX BLOOD GLUCOSE TEST) test strip; Use as instructed  Dispense: 100 each; Refill: 12 - TRUEPLUS LANCETS 28G MISC; Use as directed  Dispense: 100 each; Refill: 1 - glimepiride (AMARYL) 2 MG tablet; Take 1 tablet (2 mg total) by mouth  daily before breakfast.  Dispense: 30 tablet; Refill: 3  3. Alcohol use disorder, mild, abuse See #1 above    Patient was given the opportunity to ask questions.  Patient verbalized understanding of the  plan and was able to repeat key elements of the plan.   Orders Placed This Encounter  Procedures  . Lipase  . Hepatic Function Panel  . POCT glycosylated hemoglobin (Hb A1C)     Requested Prescriptions   Signed Prescriptions Disp Refills  . Blood Glucose Monitoring Suppl (TRUE METRIX METER) w/Device KIT 1 kit 0    Sig: Use as directed  . glucose blood (TRUE METRIX BLOOD GLUCOSE TEST) test strip 100 each 12    Sig: Use as instructed  . TRUEPLUS LANCETS 28G MISC 100 each 1    Sig: Use as directed  . glimepiride (AMARYL) 2 MG tablet 30 tablet 3    Sig: Take 1 tablet (2 mg total) by mouth daily before breakfast.  . Tdap (BOOSTRIX) 5-2.5-18.5 LF-MCG/0.5 injection 0.5 mL 0    Sig: Inject 0.5 mLs into the muscle once for 1 dose.    Return in about 1 week (around 05/02/2018) for [place].  Karle Plumber, MD, FACP

## 2018-04-25 NOTE — Patient Instructions (Addendum)
Please apply for the orange card so that if Dr. Chapman Fitch needs to refer you to a general surgeon it would be covered.  If any worsening of your pain over the weekend you should be seen in the emergency room.  I recommend cutting back on the amount of alcohol that you drink.  For me and it should be no more than 2 standard drinks a day..  Start Amaryl 2 mg daily to help better control your diabetes.  I have also included some information about healthy eating habits.  Follow a Healthy Eating Plan - You can do it! Limit sugary drinks.  Avoid sodas, sweet tea, sport or energy drinks, or fruit drinks.  Drink water, lo-fat milk, or diet drinks. Limit snack foods.   Cut back on candy, cake, cookies, chips, ice cream.  These are a special treat, only in small amounts. Eat plenty of vegetables.  Especially dark green, red, and orange vegetables. Aim for at least 3 servings a day. More is better! Include fruit in your daily diet.  Whole fruit is much healthier than fruit juice! Limit "white" bread, "white" pasta, "white" rice.   Choose "100% whole grain" products, brown or wild rice. Avoid fatty meats. Try "Meatless Monday" and choose eggs or beans one day a week.  When eating meat, choose lean meats like chicken, [place], and fish.  Grill, broil, or bake meats instead of frying, and eat poultry without the skin. Eat less salt.  Avoid frozen pizzas, frozen dinners and salty foods.  Use seasonings other than salt in cooking.  This can help blood pressure and keep you from swelling Beer, wine and liquor have calories.  If you can safely drink alcohol, limit to 1 drink per day for women, 2 drinks for men   Td Vaccine (Tetanus and Diphtheria): What You Need to Know 1. Why get vaccinated? Tetanus  and diphtheria are very serious diseases. They are rare in the [place] today, but people who do become infected often have severe complications. Td vaccine is used to protect adolescents and adults from both of these  diseases. Both tetanus and diphtheria are infections caused by bacteria. Diphtheria spreads from person to person through coughing or sneezing. Tetanus-causing bacteria enter the body through cuts, scratches, or wounds. TETANUS (lockjaw) causes painful muscle tightening and stiffness, usually all over the body.  It can lead to tightening of muscles in the head and neck so you can't open your mouth, swallow, or sometimes even breathe. Tetanus kills about 1 out of every 10 people who are infected even after receiving the best medical care.  DIPHTHERIA can cause a thick coating to form in the back of the throat.  It can lead to breathing problems, paralysis, heart failure, and death.  Before vaccines, as many as 200,000 cases of diphtheria and hundreds of cases of tetanus were reported in the [place] each year. Since vaccination began, reports of cases for both diseases have dropped by about 99%. 2. Td vaccine Td vaccine can protect adolescents and adults from tetanus and diphtheria. Td is usually given as a booster dose every 10 years but it can also be given earlier after a severe and dirty wound or burn. Another vaccine, called Tdap, which protects against pertussis in addition to tetanus and diphtheria, is sometimes recommended instead of Td vaccine. Your doctor or the person giving you the vaccine can give you more information. Td may safely be given at the same time as other vaccines. 3. Some  people should not get this vaccine  A person who has ever had a life-threatening allergic reaction after a previous dose of any tetanus or diphtheria containing vaccine, OR has a severe allergy to any part of this vaccine, should not get Td vaccine. Tell the person giving the vaccine about any severe allergies.  Talk to your doctor if you: ? had severe pain or swelling after any vaccine containing diphtheria or tetanus, ? ever had a condition called Guillain Barre Syndrome (GBS), ? aren't feeling  well on the day the shot is scheduled. 4. What are the risks from Td vaccine? With any medicine, including vaccines, there is a chance of side effects. These are usually mild and go away on their own. Serious reactions are also possible but are rare. Most people who get Td vaccine do not have any problems with it. Mild problems following Td vaccine: (Did not interfere with activities)  Pain where the shot was given (about 8 people in 10)  Redness or swelling where the shot was given (about 1 person in 4)  Mild fever (rare)  Headache (about 1 person in 4)  Tiredness (about 1 person in 4)  Moderate problems following Td vaccine: (Interfered with activities, but did not require medical attention)  Fever over 102F (rare)  Severe problems following Td vaccine: (Unable to perform usual activities; required medical attention)  Swelling, severe pain, bleeding and/or redness in the arm where the shot was given (rare).  Problems that could happen after any vaccine:  People sometimes faint after a medical procedure, including vaccination. Sitting or lying down for about 15 minutes can help prevent fainting, and injuries caused by a fall. Tell your doctor if you feel dizzy, or have vision changes or ringing in the ears.  Some people get severe pain in the shoulder and have difficulty moving the arm where a shot was given. This happens very rarely.  Any medication can cause a severe allergic reaction. Such reactions from a vaccine are very rare, estimated at fewer than 1 in a million doses, and would happen within a few minutes to a few hours after the vaccination. As with any medicine, there is a very remote chance of a vaccine causing a serious injury or death. The safety of vaccines is always being monitored. For more information, visit: http://www.aguilar.org/ 5. What if there is a serious reaction? What should I look for? Look for anything that concerns you, such as signs of a  severe allergic reaction, very high fever, or unusual behavior. Signs of a severe allergic reaction can include hives, swelling of the face and throat, difficulty breathing, a fast heartbeat, dizziness, and weakness. These would usually start a few minutes to a few hours after the vaccination. What should I do?  If you think it is a severe allergic reaction or other emergency that can't wait, call 9-1-1 or get the person to the nearest hospital. Otherwise, call your doctor.  Afterward, the reaction should be reported to the Vaccine Adverse Event Reporting System (VAERS). Your doctor might file this report, or you can do it yourself through the VAERS web site at www.vaers.SamedayNews.es, or by calling (667) 439-5652. ? VAERS does not give medical advice. 6. The National Vaccine Injury Compensation Program The [employer] Vaccine Injury Compensation Program (VICP) is a federal program that was created to compensate people who may have been injured by certain vaccines. Persons who believe they may have been injured by a vaccine can learn about the program and about filing  a claim by calling (667)865-5734 or visiting the [place] website at GoldCloset.com.ee. There is a time limit to file a claim for compensation. 7. How can I learn more?  Ask your doctor. He or she can give you the vaccine package insert or suggest other sources of information.  Call your local or state health department.  Contact the Centers for Disease Control and Prevention (CDC): ? Call (646) 497-7876 (1-800-CDC-INFO) ? Visit CDC's website at http://hunter.com/ CDC Td Vaccine VIS (12/06/15) This information is not intended to replace advice given to you by your health care provider. Make sure you discuss any questions you have with your health care provider. Document Released: 06/10/2006 Document Revised: 05/03/2016 Document Reviewed: 05/03/2016 Elsevier Interactive Patient Education  2017 [employer].

## 2018-04-26 LAB — HEPATIC FUNCTION PANEL
ALT: 48 IU/L — AB (ref 0–44)
AST: 55 IU/L — ABNORMAL HIGH (ref 0–40)
Albumin: 3.9 g/dL (ref 3.5–5.5)
Alkaline Phosphatase: 117 IU/L (ref 39–117)
BILIRUBIN, DIRECT: 0.17 mg/dL (ref 0.00–0.40)
Bilirubin Total: 0.4 mg/dL (ref 0.0–1.2)
Total Protein: 6.7 g/dL (ref 6.0–8.5)

## 2018-04-26 LAB — LIPASE: LIPASE: 29 U/L (ref 13–78)

## 2018-04-30 ENCOUNTER — Other Ambulatory Visit: Payer: Self-pay

## 2018-04-30 ENCOUNTER — Ambulatory Visit: Payer: Self-pay | Attending: Family Medicine | Admitting: Family Medicine

## 2018-04-30 ENCOUNTER — Encounter: Payer: Self-pay | Admitting: Family Medicine

## 2018-04-30 VITALS — BP 111/68 | HR 111 | Temp 98.7°F | Resp 18 | Ht 73.0 in | Wt 235.0 lb

## 2018-04-30 DIAGNOSIS — R1013 Epigastric pain: Secondary | ICD-10-CM

## 2018-04-30 DIAGNOSIS — R110 Nausea: Secondary | ICD-10-CM

## 2018-04-30 DIAGNOSIS — M545 Low back pain: Secondary | ICD-10-CM

## 2018-04-30 DIAGNOSIS — R7989 Other specified abnormal findings of blood chemistry: Secondary | ICD-10-CM

## 2018-04-30 DIAGNOSIS — F1010 Alcohol abuse, uncomplicated: Secondary | ICD-10-CM

## 2018-04-30 DIAGNOSIS — K8020 Calculus of gallbladder without cholecystitis without obstruction: Secondary | ICD-10-CM

## 2018-04-30 DIAGNOSIS — R945 Abnormal results of liver function studies: Secondary | ICD-10-CM

## 2018-04-30 DIAGNOSIS — R1011 Right upper quadrant pain: Secondary | ICD-10-CM

## 2018-04-30 DIAGNOSIS — Z87891 Personal history of nicotine dependence: Secondary | ICD-10-CM | POA: Insufficient documentation

## 2018-04-30 MED ORDER — RANITIDINE HCL 150 MG PO TABS: 150.0000 mg | ORAL_TABLET | Freq: Two times a day (BID) | ORAL | 3 refills | Status: DC

## 2018-04-30 MED ORDER — ONDANSETRON HCL 4 MG PO TABS: 4.0000 mg | ORAL_TABLET | Freq: Three times a day (TID) | ORAL | 0 refills | Status: DC | PRN

## 2018-04-30 MED ORDER — ONDANSETRON 4 MG PO TBDP: 4.0000 mg | ORAL_TABLET | Freq: Once | ORAL | Status: DC

## 2018-04-30 NOTE — Progress Notes (Signed)
Patient refused a flu shot. Patient stated he did not need a zofran, patient stated that he gets nausea at times but is not nausea now.

## 2018-04-30 NOTE — Progress Notes (Signed)
Subjective:    Patient ID: Gerald Olson, male    DOB: [birth year], 52 y.o.   MRN: [id number]  HPI       52 year old male who presents secondary to complaint of continued right upper quadrant and epigastric pain.  Patient states that he has had right upper quadrant pain for the past 3 weeks and he feels as if it is getting worse.  Patient also feels as if he is having some radiation of pain from his right upper quadrant to his back.  Patient also now with epigastric pain/pain in the upper midportion of his abdomen that is a burning sensation.  Patient also states that he has been nauseous. Upon questioning, patient states that he continues to drink " a couple of beers a day".  Patient denies any diarrhea, no change in stool color.  Patient has had no rectal bleeding.  Patient states that he continues to have lower back pain and back tightness.  Patient states that he feels as if he has fullness in his right upper quadrant but not as severe as when he was seen a few weeks ago.  Patient did have his ultrasound done which showed presence of gallstones.  Patient denies any fever or chills.  Patient states that he has had poor sleep and fatigue secondary to his abdominal pain.  Patient states that his current abdominal pain is a 6 or 7 on a 0-to-10 scale.  Patient denies any urinary frequency or dysuria.  Patient does feel that his urine has been darker/more concentrated with a mild increase in odor. Past Medical History:  Diagnosis Date  . Chronic pain    back  . Disc degeneration   . Disc degeneration, lumbar    Social History   Tobacco Use  . Smoking status: Former Research scientist (life sciences)  . Smokeless tobacco: Never Used  Substance Use Topics  . Alcohol use: Yes    Comment: cut back  . Drug use: No  History reviewed. No pertinent family history. patient reports that his father was adopted and he has no information on paternal family history; maternal GM has psoriasis and elevated blood pressure  No Known  Allergies  Review of Systems  Constitutional: Positive for fatigue. Negative for chills and fever.  HENT: Negative for sore throat and trouble swallowing.   Gastrointestinal: Positive for abdominal pain and nausea. Negative for blood in stool, constipation and diarrhea.  Genitourinary: Positive for dysuria and frequency.  Musculoskeletal: Positive for back pain and gait problem.  Neurological: Negative for dizziness and headaches.  Psychiatric/Behavioral: Positive for sleep disturbance. Negative for suicidal ideas.       Objective:   Physical Exam  BP 111/68 (BP Location: Left Arm, Patient Position: Sitting, Cuff Size: Normal)   Pulse (!) 111   Temp 98.7 F (37.1 C) (Oral)   Resp 18   Ht 6\' 1"  (1.854 m)   Wt 235 lb (106.6 kg)   SpO2 95%   BMI 31.00 kg/m   General-well-nourished, well-developed older male in no acute distress but patient is lying on his back on the exam table with his knees bent.  Patient had to be assisted to sit in an upright position as he stated that back pain was causing him to have difficulty sitting up. Lungs-clear to auscultation bilaterally. Cardiovascular-regular rate and rhythm Abdomen- normal bowel sounds, abdomen is slightly distended, patient with some right upper quadrant tenderness to palpation as well as suggestion of hepatomegaly.  Patient does not have any rebound or  guarding.  Patient with epigastric tenderness to palpation without rebound or guarding. Back- no CVA tenderness but patient with complaint of tenderness to palpation over the lower thoracic spine and lumbosacral spine and patient with thoracolumbar paraspinous spasm. Extremities-no edema      Assessment & Plan:  1. Right upper quadrant pain Patient with complaint of right upper quadrant pain.  Patient has been referred to surgery as his ultrasound did show evidence of gallstones but a new, stat referral was placed.  Patient stated that he was nauseous and was offered Zofran which he  agreed to take in the office but then per medical assistant, patient stated that he do not want the Zofran because he was no longer nauseous.  Prescription was sent into his pharmacy for Zofran to take as needed for nausea.  Patient will also have CMP.  I discussed with the patient that his liver enzymes are elevated on his most recent blood work and that this may be related to his daily alcohol use and encouraged patient to decrease and stop alcohol use. - Comprehensive metabolic panel - ondansetron (ZOFRAN) 4 MG tablet; Take 1 tablet (4 mg total) by mouth every 8 (eight) hours as needed for nausea or vomiting.  Dispense: 20 tablet; Refill: 0 - Ambulatory referral to General Surgery  2. Epigastric pain Patient with complaint of epigastric pain.  Patient will be placed on acid reflux medication, ranitidine 150 mg twice per day, but will also check lipase at today's visit - Lipase  3. Nausea Patient with complaint of nausea, patient was offered Zofran here in the office which he then declined when [employer] brought the medication to him in the exam room.  Patient does have known gallstones on ultrasound but also has elevated LFTs with chronic use of alcohol.  Zofran prescription was sent to patient's pharmacy to take as needed and patient will have CMP and lipase at today's visit.  Patient has been referred to surgery in follow-up of his gallstones - Comprehensive metabolic panel - Lipase - ondansetron (ZOFRAN) 4 MG tablet; Take 1 tablet (4 mg total) by mouth every 8 (eight) hours as needed for nausea or vomiting.  Dispense: 20 tablet; Refill: 0 - Ambulatory referral to General Surgery  4. Gallstones Patient with gallstones on abdominal ultrasound done 04/18/2018 but did not have acute cholecystitis.  Prescription provided for Zofran to take as needed and patient is being referred to surgery for further evaluation and treatment. - ondansetron (ZOFRAN) 4 MG tablet; Take 1 tablet (4 mg total) by mouth every  8 (eight) hours as needed for nausea or vomiting.  Dispense: 20 tablet; Refill: 0 - Ambulatory referral to General Surgery  5. Elevated liver function tests Discussed with patient that on his blood work done on 04/25/2018 he had elevation in liver function including elevated AST and ALT.  Discussed with patient that this could be related to his chronic alcohol use and patient was asked to decrease and stop use of alcohol as well as avoidance of Tylenol.  Patient will have repeat LFTs today as part of a CMP - Comprehensive metabolic panel  6. Bilateral low back pain, unspecified chronicity, with sciatica presence unspecified Patient with complaint of continued lower back pain but also at today's visit has complaint of recent increase in odor and darker color to the urine.  Urinalysis was ordered but CMA reported that patient stated he was unable to give a sample.  7. Alcohol use disorder, mild, abuse Patient does report chronic daily  use of alcohol and patient with recent labs with elevated LFTs and patient has been asked to reduce alcohol intake with goal of complete cessation.  *Patient was offered influenza immunization which she declined at today's visit An After Visit Summary was printed and given to the patient.  Return in about 3 months (around 07/30/2018), or if symptoms worsen or fail to improve, for DM f/u in 3 months.

## 2018-05-01 ENCOUNTER — Other Ambulatory Visit: Payer: Self-pay | Admitting: Family Medicine

## 2018-05-01 DIAGNOSIS — R748 Abnormal levels of other serum enzymes: Secondary | ICD-10-CM

## 2018-05-01 DIAGNOSIS — E875 Hyperkalemia: Secondary | ICD-10-CM

## 2018-05-01 LAB — COMPREHENSIVE METABOLIC PANEL WITH GFR
ALT: 49 IU/L — ABNORMAL HIGH (ref 0–44)
AST: 67 IU/L — ABNORMAL HIGH (ref 0–40)
Albumin/Globulin Ratio: 1.5 (ref 1.2–2.2)
Albumin: 4.1 g/dL (ref 3.5–5.5)
Alkaline Phosphatase: 98 IU/L (ref 39–117)
BUN/Creatinine Ratio: 10 (ref 9–20)
BUN: 8 mg/dL (ref 6–24)
Bilirubin Total: 0.4 mg/dL (ref 0.0–1.2)
CO2: 20 mmol/L (ref 20–29)
Calcium: 9.2 mg/dL (ref 8.7–10.2)
Chloride: 101 mmol/L (ref 96–106)
Creatinine, Ser: 0.84 mg/dL (ref 0.76–1.27)
GFR calc Af Amer: 117 mL/min/1.73
GFR calc non Af Amer: 101 mL/min/1.73
Globulin, Total: 2.8 g/dL (ref 1.5–4.5)
Glucose: 242 mg/dL — ABNORMAL HIGH (ref 65–99)
Potassium: 5.5 mmol/L — ABNORMAL HIGH (ref 3.5–5.2)
Sodium: 139 mmol/L (ref 134–144)
Total Protein: 6.9 g/dL (ref 6.0–8.5)

## 2018-05-01 LAB — LIPASE: Lipase: 31 U/L (ref 13–78)

## 2018-05-01 NOTE — Progress Notes (Unsigned)
Patient with recent CMP/CMET showing increased potassium at 5.5 and liver enzymes remain elevated. CMA will contact patient with the results and he is to return next week for repeat CMP/CMET. Order placed.

## 2018-05-07 ENCOUNTER — Telehealth: Payer: Self-pay

## 2018-05-07 NOTE — Telephone Encounter (Signed)
Patient was called verified dob and was informed of most recent lab results. Patient verbalized understanding and had no further questions.

## 2018-05-07 NOTE — Telephone Encounter (Signed)
-----   Message from Antony Blackbird, MD sent at 05/01/2018  8:22 AM EDT ----- Please let patient know that his glucose was 242 on recent lab and potassium was elevated at 5.5- please avoid potassium rich foods (bananas/oranges/orange juice and he can find a list of others on line) and come in next week for lab visit to recheck. Livers enzymes are elevated- he needs to avoid alcohol and tylenol which can further damage his liver. His lipase (pancreatic enzyme) was normal.

## 2018-05-09 ENCOUNTER — Ambulatory Visit: Payer: Self-pay | Attending: Family Medicine

## 2018-05-21 ENCOUNTER — Telehealth: Payer: Self-pay

## 2018-05-21 NOTE — Telephone Encounter (Signed)
As per Abelino Derrick, [employer] [place], they are working with the patient

## 2018-05-29 ENCOUNTER — Ambulatory Visit (INDEPENDENT_AMBULATORY_CARE_PROVIDER_SITE_OTHER): Payer: Self-pay | Admitting: Surgery

## 2018-05-29 ENCOUNTER — Encounter: Payer: Self-pay | Admitting: Surgery

## 2018-05-29 VITALS — BP 158/103 | HR 94 | Temp 97.7°F | Ht 73.0 in | Wt 236.2 lb

## 2018-05-29 DIAGNOSIS — K8020 Calculus of gallbladder without cholecystitis without obstruction: Secondary | ICD-10-CM

## 2018-05-29 NOTE — Patient Instructions (Signed)
You have requested to have your gallbladder removed. This will be done  at [facility] with Dr. Tama High.  You will most likely be out of work 1-2 weeks for this surgery. You will return after your post-op appointment with a lifting restriction for approximately 4 more weeks.  You will be able to eat anything you would like to following surgery. But, start by eating a bland diet and advance this as tolerated. The Gallbladder diet is below, please go as closely by this diet as possible prior to surgery to avoid any further attacks.  Please see the (blue)pre-care form that you have been given today. If you have any questions, please call our office.  Laparoscopic Cholecystectomy Laparoscopic cholecystectomy is surgery to remove the gallbladder. The gallbladder is located in the upper right part of the abdomen, behind the liver. It is a storage sac for bile, which is produced in the liver. Bile aids in the digestion and absorption of fats. Cholecystectomy is often done for inflammation of the gallbladder (cholecystitis). This condition is usually caused by a buildup of gallstones (cholelithiasis) in the gallbladder. Gallstones can block the flow of bile, and that can result in inflammation and pain. In severe cases, emergency surgery may be required. If emergency surgery is not required, you will have time to prepare for the procedure. Laparoscopic surgery is an alternative to open surgery. Laparoscopic surgery has a shorter recovery time. Your common bile duct may also need to be examined during the procedure. If stones are found in the common bile duct, they may be removed. LET [facility] CARE PROVIDER KNOW ABOUT:  Any allergies you have.  All medicines you are taking, including vitamins, herbs, eye drops, creams, and over-the-counter medicines.  Previous problems you or members of your family have had with the use of anesthetics.  Any blood disorders you have.  Previous surgeries you  have had.    Any medical conditions you have. RISKS AND COMPLICATIONS Generally, this is a safe procedure. However, problems may occur, including:  Infection.  Bleeding.  Allergic reactions to medicines.  Damage to other structures or organs.  A stone remaining in the common bile duct.  A bile leak from the cyst duct that is clipped when your gallbladder is removed.  The need to convert to open surgery, which requires a larger incision in the abdomen. This may be necessary if your surgeon thinks that it is not safe to continue with a laparoscopic procedure. BEFORE THE PROCEDURE  Ask your health care provider about:  Changing or stopping your regular medicines. This is especially important if you are taking diabetes medicines or blood thinners.  Taking medicines such as aspirin and ibuprofen. These medicines can thin your blood. Do not take these medicines before your procedure if your health care provider instructs you not to.  Follow instructions from your health care provider about eating or drinking restrictions.  Let your health care provider know if you develop a cold or an infection before surgery.  Plan to have someone take you home after the procedure.  Ask your health care provider how your surgical site will be marked or identified.  You may be given antibiotic medicine to help prevent infection. PROCEDURE  To reduce your risk of infection:  Your health care team will wash or sanitize their hands.  Your skin will be washed with soap.  An IV tube may be inserted into one of your veins.  You will be given a medicine to make  you fall asleep (general anesthetic).  A breathing tube will be placed in your mouth.  The surgeon will make several small cuts (incisions) in your abdomen.  A thin, lighted tube (laparoscope) that has a tiny camera on the end will be inserted through one of the small incisions. The camera on the laparoscope will send a picture to a TV  screen (monitor) in the operating room. This will give the surgeon a good view inside your abdomen.  A gas will be pumped into your abdomen. This will expand your abdomen to give the surgeon more room to perform the surgery.  Other tools that are needed for the procedure will be inserted through the other incisions. The gallbladder will be removed through one of the incisions.  After your gallbladder has been removed, the incisions will be closed with stitches (sutures), staples, or skin glue.  Your incisions may be covered with a bandage (dressing). The procedure may vary among health care providers and hospitals. AFTER THE PROCEDURE  Your blood pressure, heart rate, breathing rate, and blood oxygen level will be monitored often until the medicines you were given have worn off.  You will be given medicines as needed to control your pain.   This information is not intended to replace advice given to you by your health care provider. Make sure you discuss any questions you have with your health care provider.   Document Released: 08/13/2005 Document Revised: 05/04/2015 Document Reviewed: 03/25/2013 Elsevier Interactive Patient Education 2016 [place] Diet for Gallbladder Conditions A low-fat diet can be helpful if you have pancreatitis or a gallbladder condition. With these conditions, your pancreas and gallbladder have trouble digesting fats. A healthy eating plan with less fat will help rest your pancreas and gallbladder and reduce your symptoms. WHAT DO I NEED TO KNOW ABOUT THIS DIET?  Eat a low-fat diet.  Reduce your fat intake to less than 20-30% of your total daily calories. This is less than 50-60 g of fat per day.  Remember that you need some fat in your diet. Ask your dietician what your daily goal should be.  Choose nonfat and low-fat healthy foods. Look for the words "nonfat," "low fat," or "fat free."  As a guide, look on the label and choose foods with  less than 3 g of fat per serving. Eat only one serving.  Avoid alcohol.  Do not smoke. If you need help quitting, talk with your health care provider.  Eat small frequent meals instead of three large heavy meals. WHAT FOODS CAN I EAT? Grains Include healthy grains and starches such as potatoes, wheat bread, fiber-rich cereal, and brown rice. Choose whole grain options whenever possible. In adults, whole grains should account for 45-65% of your daily calories.  Fruits and Vegetables Eat plenty of fruits and vegetables. Fresh fruits and vegetables add fiber to your diet. Meats and Other Protein Sources Eat lean meat such as chicken and pork. Trim any fat off of meat before cooking it. Eggs, fish, and beans are other sources of protein. In adults, these foods should account for 10-35% of your daily calories. Dairy Choose low-fat milk and dairy options. Dairy includes fat and protein, as well as calcium.  Fats and Oils Limit high-fat foods such as fried foods, sweets, baked goods, sugary drinks.  Other Creamy sauces and condiments, such as mayonnaise, can add extra fat. Think about whether or not you need to use them, or use smaller amounts or low fat options.  WHAT FOODS ARE NOT RECOMMENDED?  High fat foods, such as:  [employer].  Ice cream.  [place] toast.  Sweet rolls.  Pizza.  Cheese bread.  Foods covered with batter, butter, creamy sauces, or cheese.  Fried foods.  Sugary drinks and desserts.  Foods that cause gas or bloating   This information is not intended to replace advice given to you by your health care provider. Make sure you discuss any questions you have with your health care provider.   Document Released: 08/18/2013 Document Reviewed: 08/18/2013 Elsevier Interactive Patient Education [employer].

## 2018-05-29 NOTE — H&P (View-Only) (Signed)
Surgical Clinic History and Physical  Referring provider:  Ladell Pier, MD [place], [place] [address]  HISTORY OF PRESENT ILLNESS (HPI):  52 y.o. male presents for evaluation of RUQ abdominal pain x 2 months, describes pain was initially intermittent, but has become more frequent and constant over the past week. He describes his RUQ abdominal pain is not as bothersome as his severe and chronic debilitating back pain s/p many injuries, at least several major ones of which he associates with his [place] experience. He has not noticed whether his pain is any worse with particular foods or with eating, but he says his mom and several other family members have underwent cholecystectomy with relief from their abdominal pain, and he wants to be able to set aside his RUQ abdominal pain to focus on his back pain. Patient otherwise denies any fever/chills, N/V, CP, or SOB with +flatus and +BM's WNL. Of note, patient also describes Right flank "fullness", not noticed on Left side, no known trauma.  PAST MEDICAL HISTORY (PMH):  Past Medical History:  Diagnosis Date  . Chronic pain    back  . Disc degeneration   . Disc degeneration, lumbar      PAST SURGICAL HISTORY ([place]):  History reviewed. No pertinent surgical history.   MEDICATIONS:  Prior to Admission medications   Medication Sig Start Date End Date Taking? Authorizing Provider  Blood Glucose Monitoring Suppl (TRUE METRIX METER) w/Device KIT Use as directed Patient not taking: Reported on 04/30/2018 04/25/18   Ladell Pier, MD  celecoxib (CELEBREX) 100 MG capsule Take 1 capsule (100 mg total) by mouth 2 (two) times daily. Patient not taking: Reported on 05/29/2018 04/14/18   Fulp, Ander Gaster, MD  glimepiride (AMARYL) 2 MG tablet Take 1 tablet (2 mg total) by mouth daily before breakfast. Patient not taking: Reported on 04/30/2018 04/25/18   Ladell Pier, MD  glucose blood (TRUE METRIX BLOOD GLUCOSE TEST) test strip Use  as instructed Patient not taking: Reported on 04/30/2018 04/25/18   Ladell Pier, MD  ibuprofen (ADVIL,MOTRIN) 800 MG tablet Take 800 mg by mouth every 6 (six) hours as needed.    [provider]  methocarbamol (ROBAXIN) 500 MG tablet Take 1 tablet (500 mg total) by mouth 2 (two) times daily. Patient not taking: Reported on 04/30/2018 12/03/17   Hedges, Dellis Filbert, PA-C  naproxen (NAPROSYN) 500 MG tablet Take 500 mg by mouth 2 (two) times daily with a meal.    [provider]  ondansetron (ZOFRAN) 4 MG tablet Take 1 tablet (4 mg total) by mouth every 8 (eight) hours as needed for nausea or vomiting. Patient not taking: Reported on 05/29/2018 04/30/18   Fulp, Ander Gaster, MD  ranitidine (ZANTAC) 150 MG tablet Take 1 tablet (150 mg total) by mouth 2 (two) times daily. Patient not taking: Reported on 05/29/2018 04/30/18   Antony Blackbird, MD  TRUEPLUS LANCETS 28G MISC Use as directed Patient not taking: Reported on 04/30/2018 04/25/18   Ladell Pier, MD     ALLERGIES:  No Known Allergies   SOCIAL HISTORY:  Social History   Socioeconomic History  . Marital status: Single    Spouse name: Not on file  . Number of children: Not on file  . Years of education: Not on file  . Highest education level: Not on file  Occupational History  . Not on file  Social Needs  . Financial resource strain: Not on file  . Food insecurity:    Worry:  Not on file    Inability: Not on file  . Transportation needs:    Medical: Not on file    Non-medical: Not on file  Tobacco Use  . Smoking status: Former Research scientist (life sciences)  . Smokeless tobacco: Never Used  Substance and Sexual Activity  . Alcohol use: Yes    Comment: cut back  . Drug use: No  . Sexual activity: Yes  Lifestyle  . Physical activity:    Days per week: Not on file    Minutes per session: Not on file  . Stress: Not on file  Relationships  . Social connections:    Talks on phone: Not on file    Gets together: Not on file    Attends religious  service: Not on file    Active member of club or organization: Not on file    Attends meetings of clubs or organizations: Not on file    Relationship status: Not on file  . Intimate partner violence:    Fear of current or ex partner: Not on file    Emotionally abused: Not on file    Physically abused: Not on file    Forced sexual activity: Not on file  Other Topics Concern  . Not on file  Social History Narrative  . Not on file    The patient currently resides (home / rehab facility / nursing home): Home The patient normally is (ambulatory / bedbound): Ambulation with cane limited due to back pain  FAMILY HISTORY:  Family History  Problem Relation Age of Onset  . Aneurysm Father     Otherwise negative/non-contributory.  REVIEW OF SYSTEMS:  Constitutional: denies any other weight loss, fever, chills, or sweats  Eyes: denies any other vision changes, history of eye injury  ENT: denies sore throat, hearing problems  Respiratory: denies shortness of breath, wheezing  Cardiovascular: denies chest pain, palpitations  Gastrointestinal: abdominal pain, N/V, and bowel function as per HPI Musculoskeletal: denies any other joint pains or cramps except chronic back pain as per HPI Skin: Denies any other rashes or skin discolorations except as per HPI Neurological: denies any other headache, dizziness, weakness  Psychiatric: Denies any other depression, anxiety   All other review of systems were otherwise negative   VITAL SIGNS:  BP (!) 158/103   Pulse 94   Temp 97.7 F (36.5 C) (Temporal)   Ht '6\' 1"'$  (1.854 m)   Wt 236 lb 3.2 oz (107.1 kg)   BMI 31.16 kg/m   PHYSICAL EXAM:  Constitutional:  -- Normal body habitus  -- Awake, alert, and oriented x3  Eyes:  -- Pupils equally round and reactive to light  -- No scleral icterus  Ear, nose, throat:  -- No jugular venous distension -- No nasal drainage, bleeding Pulmonary:  -- No crackles  -- Equal breath sounds bilaterally --  Breathing non-labored at rest Cardiovascular:  -- S1, S2 present  -- No pericardial rubs  Gastrointestinal:  -- Abdomen soft and non-distended with RUQ abdominal tenderness to palpation, no guarding/rebound tenderness, Right flank abdominal wall laxity without overt hernia -- No abdominal masses appreciated, pulsatile or otherwise  Musculoskeletal and Integumentary:  -- Wounds or skin discoloration: None appreciated except as described above (GI) -- Extremities: B/L UE and LE FROM, hands and feet warm, no edema  Neurologic:  -- Motor function: Intact and symmetric -- Sensation: Intact and symmetric  Labs:  CBC Latest Ref Rng & Units 04/14/2018  WBC 3.4 - 10.8 x10E3/uL 6.8  Hemoglobin 13.0 -  17.7 g/dL 14.1  Hematocrit 37.5 - 51.0 % 44.3  Platelets 150 - 450 x10E3/uL 260   CMP Latest Ref Rng & Units 04/30/2018 04/25/2018 04/14/2018  Glucose 65 - 99 mg/dL 242(H) - 358(H)  BUN 6 - 24 mg/dL 8 - 15  Creatinine 0.76 - 1.27 mg/dL 0.84 - 0.97  Sodium 134 - 144 mmol/L 139 - 137  Potassium 3.5 - 5.2 mmol/L 5.5(H) - 4.8  Chloride 96 - 106 mmol/L 101 - 97  CO2 20 - 29 mmol/L 20 - 24  Calcium 8.7 - 10.2 mg/dL 9.2 - 9.2  Total Protein 6.0 - 8.5 g/dL 6.9 6.7 7.0  Total Bilirubin 0.0 - 1.2 mg/dL 0.4 0.4 0.4  Alkaline Phos 39 - 117 IU/L 98 117 123(H)  AST 0 - 40 IU/L 67(H) 55(H) 44(H)  ALT 0 - 44 IU/L 49(H) 48(H) 42    Imaging studies:  Limited RUQ Abdominal Ultrasound (05/30/2018) Layering echogenic focus within the gallbladder lumen most consistent with a small stone, largest of which measures 6 mm. Gallbladder wall measure within normal limits at 2.7 mm. No free pericholecystic fluid. No sonographic Murphy sign elicited on  exam. Common bile duct: diameter: 2.7 mm.  Assessment/Plan: (ICD-10's: K80.20 vs K80.10) 52 y.o. male with symptomatic cholelithiasis, possible chronic cholecystitis, complicated by pertinent comorbidities including chronic lower back pain attributable at least in part to  multiple military service-associated injuries and degenerative disc disease.               - avoid/minimize foods with higher fat content (meats, cheeses/dairy, and fried)             - prefer low-fat vegetables, whole grains (wheat bread, ceareals, etc), and fruits until cholecystectomy              - all risks, benefits, and alternatives to cholecystectomy were discussed with the patient, all of his questions were answered to patient's expressed satisfaction, patient expresses he wishes to proceed, and informed consent was obtained.             - will plan for laparoscopic cholecystectomy next week on 10/7 per patient request pending anesthesia and OR availability             - anticipate return to clinic 2 weeks after above planned surgery             - instructed to call if any questions or concerns   All of the above recommendations were discussed with the patient, and all of patient's questions were answered to his expressed satisfaction.  Thank you for the opportunity to participate in this patient's care.  -- Marilynne Drivers [name], MD, [place]: [facility] - Partnering for exceptional care. Office: [phone]

## 2018-05-29 NOTE — Progress Notes (Signed)
Surgical Clinic History and Physical  Referring provider:  Ladell Pier, MD [place], [place] [address]  HISTORY OF PRESENT ILLNESS (HPI):  52 y.o. male presents for evaluation of RUQ abdominal pain x 2 months, describes pain was initially intermittent, but has become more frequent and constant over the past week. He describes his RUQ abdominal pain is not as bothersome as his severe and chronic debilitating back pain s/p many injuries, at least several major ones of which he associates with his [place] experience. He has not noticed whether his pain is any worse with particular foods or with eating, but he says his mom and several other family members have underwent cholecystectomy with relief from their abdominal pain, and he wants to be able to set aside his RUQ abdominal pain to focus on his back pain. Patient otherwise denies any fever/chills, N/V, CP, or SOB with +flatus and +BM's WNL. Of note, patient also describes Right flank "fullness", not noticed on Left side, no known trauma.  PAST MEDICAL HISTORY (PMH):  Past Medical History:  Diagnosis Date  . Chronic pain    back  . Disc degeneration   . Disc degeneration, lumbar      PAST SURGICAL HISTORY ([place]):  History reviewed. No pertinent surgical history.   MEDICATIONS:  Prior to Admission medications   Medication Sig Start Date End Date Taking? Authorizing Provider  Blood Glucose Monitoring Suppl (TRUE METRIX METER) w/Device KIT Use as directed Patient not taking: Reported on 04/30/2018 04/25/18   Ladell Pier, MD  celecoxib (CELEBREX) 100 MG capsule Take 1 capsule (100 mg total) by mouth 2 (two) times daily. Patient not taking: Reported on 05/29/2018 04/14/18   Fulp, Ander Gaster, MD  glimepiride (AMARYL) 2 MG tablet Take 1 tablet (2 mg total) by mouth daily before breakfast. Patient not taking: Reported on 04/30/2018 04/25/18   Ladell Pier, MD  glucose blood (TRUE METRIX BLOOD GLUCOSE TEST) test strip Use  as instructed Patient not taking: Reported on 04/30/2018 04/25/18   Ladell Pier, MD  ibuprofen (ADVIL,MOTRIN) 800 MG tablet Take 800 mg by mouth every 6 (six) hours as needed.    [provider]  methocarbamol (ROBAXIN) 500 MG tablet Take 1 tablet (500 mg total) by mouth 2 (two) times daily. Patient not taking: Reported on 04/30/2018 12/03/17   Hedges, Dellis Filbert, PA-C  naproxen (NAPROSYN) 500 MG tablet Take 500 mg by mouth 2 (two) times daily with a meal.    [provider]  ondansetron (ZOFRAN) 4 MG tablet Take 1 tablet (4 mg total) by mouth every 8 (eight) hours as needed for nausea or vomiting. Patient not taking: Reported on 05/29/2018 04/30/18   Fulp, Ander Gaster, MD  ranitidine (ZANTAC) 150 MG tablet Take 1 tablet (150 mg total) by mouth 2 (two) times daily. Patient not taking: Reported on 05/29/2018 04/30/18   Antony Blackbird, MD  TRUEPLUS LANCETS 28G MISC Use as directed Patient not taking: Reported on 04/30/2018 04/25/18   Ladell Pier, MD     ALLERGIES:  No Known Allergies   SOCIAL HISTORY:  Social History   Socioeconomic History  . Marital status: Single    Spouse name: Not on file  . Number of children: Not on file  . Years of education: Not on file  . Highest education level: Not on file  Occupational History  . Not on file  Social Needs  . Financial resource strain: Not on file  . Food insecurity:    Worry:  Not on file    Inability: Not on file  . Transportation needs:    Medical: Not on file    Non-medical: Not on file  Tobacco Use  . Smoking status: Former Research scientist (life sciences)  . Smokeless tobacco: Never Used  Substance and Sexual Activity  . Alcohol use: Yes    Comment: cut back  . Drug use: No  . Sexual activity: Yes  Lifestyle  . Physical activity:    Days per week: Not on file    Minutes per session: Not on file  . Stress: Not on file  Relationships  . Social connections:    Talks on phone: Not on file    Gets together: Not on file    Attends religious  service: Not on file    Active member of club or organization: Not on file    Attends meetings of clubs or organizations: Not on file    Relationship status: Not on file  . Intimate partner violence:    Fear of current or ex partner: Not on file    Emotionally abused: Not on file    Physically abused: Not on file    Forced sexual activity: Not on file  Other Topics Concern  . Not on file  Social History Narrative  . Not on file    The patient currently resides (home / rehab facility / nursing home): Home The patient normally is (ambulatory / bedbound): Ambulation with cane limited due to back pain  FAMILY HISTORY:  Family History  Problem Relation Age of Onset  . Aneurysm Father     Otherwise negative/non-contributory.  REVIEW OF SYSTEMS:  Constitutional: denies any other weight loss, fever, chills, or sweats  Eyes: denies any other vision changes, history of eye injury  ENT: denies sore throat, hearing problems  Respiratory: denies shortness of breath, wheezing  Cardiovascular: denies chest pain, palpitations  Gastrointestinal: abdominal pain, N/V, and bowel function as per HPI Musculoskeletal: denies any other joint pains or cramps except chronic back pain as per HPI Skin: Denies any other rashes or skin discolorations except as per HPI Neurological: denies any other headache, dizziness, weakness  Psychiatric: Denies any other depression, anxiety   All other review of systems were otherwise negative   VITAL SIGNS:  BP (!) 158/103   Pulse 94   Temp 97.7 F (36.5 C) (Temporal)   Ht '6\' 1"'$  (1.854 m)   Wt 236 lb 3.2 oz (107.1 kg)   BMI 31.16 kg/m   PHYSICAL EXAM:  Constitutional:  -- Normal body habitus  -- Awake, alert, and oriented x3  Eyes:  -- Pupils equally round and reactive to light  -- No scleral icterus  Ear, nose, throat:  -- No jugular venous distension -- No nasal drainage, bleeding Pulmonary:  -- No crackles  -- Equal breath sounds bilaterally --  Breathing non-labored at rest Cardiovascular:  -- S1, S2 present  -- No pericardial rubs  Gastrointestinal:  -- Abdomen soft and non-distended with RUQ abdominal tenderness to palpation, no guarding/rebound tenderness, Right flank abdominal wall laxity without overt hernia -- No abdominal masses appreciated, pulsatile or otherwise  Musculoskeletal and Integumentary:  -- Wounds or skin discoloration: None appreciated except as described above (GI) -- Extremities: B/L UE and LE FROM, hands and feet warm, no edema  Neurologic:  -- Motor function: Intact and symmetric -- Sensation: Intact and symmetric  Labs:  CBC Latest Ref Rng & Units 04/14/2018  WBC 3.4 - 10.8 x10E3/uL 6.8  Hemoglobin 13.0 -  17.7 g/dL 14.1  Hematocrit 37.5 - 51.0 % 44.3  Platelets 150 - 450 x10E3/uL 260   CMP Latest Ref Rng & Units 04/30/2018 04/25/2018 04/14/2018  Glucose 65 - 99 mg/dL 242(H) - 358(H)  BUN 6 - 24 mg/dL 8 - 15  Creatinine 0.76 - 1.27 mg/dL 0.84 - 0.97  Sodium 134 - 144 mmol/L 139 - 137  Potassium 3.5 - 5.2 mmol/L 5.5(H) - 4.8  Chloride 96 - 106 mmol/L 101 - 97  CO2 20 - 29 mmol/L 20 - 24  Calcium 8.7 - 10.2 mg/dL 9.2 - 9.2  Total Protein 6.0 - 8.5 g/dL 6.9 6.7 7.0  Total Bilirubin 0.0 - 1.2 mg/dL 0.4 0.4 0.4  Alkaline Phos 39 - 117 IU/L 98 117 123(H)  AST 0 - 40 IU/L 67(H) 55(H) 44(H)  ALT 0 - 44 IU/L 49(H) 48(H) 42    Imaging studies:  Limited RUQ Abdominal Ultrasound (05/30/2018) Layering echogenic focus within the gallbladder lumen most consistent with a small stone, largest of which measures 6 mm. Gallbladder wall measure within normal limits at 2.7 mm. No free pericholecystic fluid. No sonographic Murphy sign elicited on  exam. Common bile duct: diameter: 2.7 mm.  Assessment/Plan: (ICD-10's: K80.20 vs K80.10) 52 y.o. male with symptomatic cholelithiasis, possible chronic cholecystitis, complicated by pertinent comorbidities including chronic lower back pain attributable at least in part to  multiple military service-associated injuries and degenerative disc disease.               - avoid/minimize foods with higher fat content (meats, cheeses/dairy, and fried)             - prefer low-fat vegetables, whole grains (wheat bread, ceareals, etc), and fruits until cholecystectomy              - all risks, benefits, and alternatives to cholecystectomy were discussed with the patient, all of his questions were answered to patient's expressed satisfaction, patient expresses he wishes to proceed, and informed consent was obtained.             - will plan for laparoscopic cholecystectomy next week on 10/7 per patient request pending anesthesia and OR availability             - anticipate return to clinic 2 weeks after above planned surgery             - instructed to call if any questions or concerns   All of the above recommendations were discussed with the patient, and all of patient's questions were answered to his expressed satisfaction.  Thank you for the opportunity to participate in this patient's care.  -- Marilynne Drivers [name], MD, [place]: [facility] - Partnering for exceptional care. Office: [phone]

## 2018-05-30 ENCOUNTER — Encounter: Payer: Self-pay | Admitting: Surgery

## 2018-06-01 MED ORDER — CEFAZOLIN SODIUM-DEXTROSE 2-4 GM/100ML-% IV SOLN
2.0000 g | INTRAVENOUS | Status: AC
  Administered 2018-06-02: 2 g via INTRAVENOUS

## 2018-06-02 ENCOUNTER — Ambulatory Visit: Payer: Medicaid Other | Admitting: Anesthesiology

## 2018-06-02 ENCOUNTER — Encounter
Admission: RE | Disposition: A | Discharge status: Home / Self Care | Payer: Self-pay | Source: Ambulatory Visit | Attending: Surgery

## 2018-06-02 ENCOUNTER — Other Ambulatory Visit: Payer: Self-pay

## 2018-06-02 ENCOUNTER — Telehealth: Payer: Self-pay | Admitting: Surgery

## 2018-06-02 ENCOUNTER — Ambulatory Visit
Admission: RE | Admit: 2018-06-02 | Discharge: 2018-06-02 | Disposition: A | Discharge status: Home / Self Care | Payer: Medicaid Other | Source: Ambulatory Visit | Attending: Surgery | Admitting: Surgery

## 2018-06-02 DIAGNOSIS — Z87891 Personal history of nicotine dependence: Secondary | ICD-10-CM | POA: Diagnosis not present

## 2018-06-02 DIAGNOSIS — K8010 Calculus of gallbladder with chronic cholecystitis without obstruction: Secondary | ICD-10-CM | POA: Insufficient documentation

## 2018-06-02 DIAGNOSIS — Z791 Long term (current) use of non-steroidal anti-inflammatories (NSAID): Secondary | ICD-10-CM | POA: Insufficient documentation

## 2018-06-02 DIAGNOSIS — E119 Type 2 diabetes mellitus without complications: Secondary | ICD-10-CM | POA: Insufficient documentation

## 2018-06-02 DIAGNOSIS — K7030 Alcoholic cirrhosis of liver without ascites: Secondary | ICD-10-CM | POA: Insufficient documentation

## 2018-06-02 DIAGNOSIS — K8020 Calculus of gallbladder without cholecystitis without obstruction: Secondary | ICD-10-CM

## 2018-06-02 DIAGNOSIS — R1011 Right upper quadrant pain: Secondary | ICD-10-CM | POA: Diagnosis present

## 2018-06-02 HISTORY — DX: Headache: R51

## 2018-06-02 HISTORY — DX: Headache, unspecified: R51.9

## 2018-06-02 HISTORY — PX: CHOLECYSTECTOMY: SHX55

## 2018-06-02 LAB — CBC WITH DIFFERENTIAL/PLATELET
BASOS ABS: 0.1 10*3/uL (ref 0–0.1)
Basophils Relative: 1 %
Eosinophils Absolute: 0.1 10*3/uL (ref 0–0.7)
Eosinophils Relative: 1 %
HCT: 44.9 % (ref 40.0–52.0)
Hemoglobin: 15.3 g/dL (ref 13.0–18.0)
LYMPHS ABS: 1.3 10*3/uL (ref 1.0–3.6)
LYMPHS PCT: 18 %
MCH: 28.8 pg (ref 26.0–34.0)
MCHC: 34 g/dL (ref 32.0–36.0)
MCV: 84.5 fL (ref 80.0–100.0)
Monocytes Absolute: 0.7 10*3/uL (ref 0.2–1.0)
Monocytes Relative: 10 %
NEUTROS ABS: 5.1 10*3/uL (ref 1.4–6.5)
Neutrophils Relative %: 70 %
PLATELETS: 252 10*3/uL (ref 150–440)
RBC: 5.32 MIL/uL (ref 4.40–5.90)
RDW: 14.1 % (ref 11.5–14.5)
WBC: 7.3 10*3/uL (ref 3.8–10.6)

## 2018-06-02 LAB — COMPREHENSIVE METABOLIC PANEL
ALK PHOS: 82 U/L (ref 38–126)
ALT: 43 U/L (ref 0–44)
AST: 54 U/L — AB (ref 15–41)
Albumin: 4 g/dL (ref 3.5–5.0)
Anion gap: 13 (ref 5–15)
BILIRUBIN TOTAL: 1 mg/dL (ref 0.3–1.2)
BUN: 17 mg/dL (ref 6–20)
CHLORIDE: 96 mmol/L — AB (ref 98–111)
CO2: 24 mmol/L (ref 22–32)
CREATININE: 0.89 mg/dL (ref 0.61–1.24)
Calcium: 9.1 mg/dL (ref 8.9–10.3)
Glucose, Bld: 285 mg/dL — ABNORMAL HIGH (ref 70–99)
Potassium: 3.9 mmol/L (ref 3.5–5.1)
Sodium: 133 mmol/L — ABNORMAL LOW (ref 135–145)
Total Protein: 7.4 g/dL (ref 6.5–8.1)

## 2018-06-02 LAB — BASIC METABOLIC PANEL
ANION GAP: 6 (ref 5–15)
BUN: 16 mg/dL (ref 6–20)
CALCIUM: 8.2 mg/dL — AB (ref 8.9–10.3)
CO2: 24 mmol/L (ref 22–32)
Chloride: 103 mmol/L (ref 98–111)
Creatinine, Ser: 0.93 mg/dL (ref 0.61–1.24)
GLUCOSE: 309 mg/dL — AB (ref 70–99)
POTASSIUM: 4.5 mmol/L (ref 3.5–5.1)
SODIUM: 133 mmol/L — AB (ref 135–145)

## 2018-06-02 LAB — HEMOGLOBIN A1C
HEMOGLOBIN A1C: 9.2 % — AB (ref 4.8–5.6)
Mean Plasma Glucose: 217.34 mg/dL

## 2018-06-02 LAB — GLUCOSE, CAPILLARY
GLUCOSE-CAPILLARY: 295 mg/dL — AB (ref 70–99)
GLUCOSE-CAPILLARY: 309 mg/dL — AB (ref 70–99)
Glucose-Capillary: 248 mg/dL — ABNORMAL HIGH (ref 70–99)
Glucose-Capillary: 287 mg/dL — ABNORMAL HIGH (ref 70–99)

## 2018-06-02 SURGERY — LAPAROSCOPIC CHOLECYSTECTOMY
Anesthesia: General | Site: Abdomen

## 2018-06-02 MED ORDER — FENTANYL CITRATE (PF) 100 MCG/2ML IJ SOLN
INTRAMUSCULAR | Status: AC
  Administered 2018-06-02: 50 ug via INTRAVENOUS
  Filled 2018-06-02: qty 2

## 2018-06-02 MED ORDER — PHENYLEPHRINE HCL 10 MG/ML IJ SOLN
INTRAMUSCULAR | Status: DC | PRN
  Administered 2018-06-02: 200 ug via INTRAVENOUS
  Administered 2018-06-02 (×2): 100 ug via INTRAVENOUS
  Administered 2018-06-02: 200 ug via INTRAVENOUS
  Administered 2018-06-02: 100 ug via INTRAVENOUS

## 2018-06-02 MED ORDER — GLIMEPIRIDE 2 MG PO TABS: 2.0000 mg | ORAL_TABLET | Freq: Every day | ORAL | Status: DC

## 2018-06-02 MED ORDER — CHLORHEXIDINE GLUCONATE CLOTH 2 % EX PADS: 6.0000 | MEDICATED_PAD | Freq: Once | CUTANEOUS | Status: DC

## 2018-06-02 MED ORDER — BUPIVACAINE HCL (PF) 0.5 % IJ SOLN
INTRAMUSCULAR | Status: AC
  Filled 2018-06-02: qty 30

## 2018-06-02 MED ORDER — FENTANYL CITRATE (PF) 100 MCG/2ML IJ SOLN
INTRAMUSCULAR | Status: DC | PRN
  Administered 2018-06-02 (×3): 50 ug via INTRAVENOUS

## 2018-06-02 MED ORDER — GABAPENTIN 300 MG PO CAPS
300.0000 mg | ORAL_CAPSULE | ORAL | Status: AC
  Administered 2018-06-02: 300 mg via ORAL

## 2018-06-02 MED ORDER — PROMETHAZINE HCL 25 MG/ML IJ SOLN: 6.2500 mg | INTRAMUSCULAR | Status: DC | PRN

## 2018-06-02 MED ORDER — ONDANSETRON HCL 4 MG/2ML IJ SOLN
INTRAMUSCULAR | Status: DC | PRN
  Administered 2018-06-02: 4 mg via INTRAVENOUS

## 2018-06-02 MED ORDER — MIDAZOLAM HCL 2 MG/2ML IJ SOLN
INTRAMUSCULAR | Status: AC
  Filled 2018-06-02: qty 2

## 2018-06-02 MED ORDER — FENTANYL CITRATE (PF) 100 MCG/2ML IJ SOLN
25.0000 ug | INTRAMUSCULAR | Status: DC | PRN
  Administered 2018-06-02 (×3): 50 ug via INTRAVENOUS

## 2018-06-02 MED ORDER — INSULIN ASPART 100 UNIT/ML ~~LOC~~ SOLN
5.0000 [IU] | Freq: Once | SUBCUTANEOUS | Status: AC
  Administered 2018-06-02: 5 [IU] via SUBCUTANEOUS

## 2018-06-02 MED ORDER — METFORMIN HCL 500 MG PO TABS: 500.0000 mg | ORAL_TABLET | Freq: Two times a day (BID) | ORAL | Status: DC

## 2018-06-02 MED ORDER — GABAPENTIN 300 MG PO CAPS
ORAL_CAPSULE | ORAL | Status: AC
  Administered 2018-06-02: 300 mg via ORAL
  Filled 2018-06-02: qty 1

## 2018-06-02 MED ORDER — OXYCODONE HCL 5 MG PO TABS: 5.0000 mg | ORAL_TABLET | ORAL | 0 refills | Status: DC | PRN

## 2018-06-02 MED ORDER — MEPERIDINE HCL 50 MG/ML IJ SOLN: 6.2500 mg | INTRAMUSCULAR | Status: DC | PRN

## 2018-06-02 MED ORDER — PROPOFOL 10 MG/ML IV BOLUS
INTRAVENOUS | Status: DC | PRN
  Administered 2018-06-02: 200 mg via INTRAVENOUS

## 2018-06-02 MED ORDER — LACTATED RINGERS IV SOLN
INTRAVENOUS | Status: DC | PRN
  Administered 2018-06-02: 14:00:00 via INTRAVENOUS

## 2018-06-02 MED ORDER — OXYCODONE HCL 5 MG PO TABS: 5.0000 mg | ORAL_TABLET | Freq: Once | ORAL | Status: DC

## 2018-06-02 MED ORDER — LIDOCAINE HCL (CARDIAC) PF 100 MG/5ML IV SOSY
PREFILLED_SYRINGE | INTRAVENOUS | Status: DC | PRN
  Administered 2018-06-02: 100 mg via INTRAVENOUS

## 2018-06-02 MED ORDER — SODIUM CHLORIDE 0.9 % IV SOLN
INTRAVENOUS | Status: DC
  Administered 2018-06-02: 12:00:00 via INTRAVENOUS

## 2018-06-02 MED ORDER — HYDROMORPHONE HCL 1 MG/ML IJ SOLN
INTRAMUSCULAR | Status: AC
  Filled 2018-06-02: qty 1

## 2018-06-02 MED ORDER — CEFAZOLIN SODIUM-DEXTROSE 2-4 GM/100ML-% IV SOLN
INTRAVENOUS | Status: AC
  Filled 2018-06-02: qty 100

## 2018-06-02 MED ORDER — FENTANYL CITRATE (PF) 250 MCG/5ML IJ SOLN
INTRAMUSCULAR | Status: AC
  Filled 2018-06-02: qty 5

## 2018-06-02 MED ORDER — INSULIN ASPART 100 UNIT/ML ~~LOC~~ SOLN
SUBCUTANEOUS | Status: AC
  Filled 2018-06-02: qty 1

## 2018-06-02 MED ORDER — FENTANYL CITRATE (PF) 100 MCG/2ML IJ SOLN: INTRAMUSCULAR | Status: DC | PRN

## 2018-06-02 MED ORDER — PROPOFOL 10 MG/ML IV BOLUS
INTRAVENOUS | Status: AC
  Filled 2018-06-02: qty 20

## 2018-06-02 MED ORDER — ACETAMINOPHEN 500 MG PO TABS
1000.0000 mg | ORAL_TABLET | ORAL | Status: AC
  Administered 2018-06-02: 1000 mg via ORAL

## 2018-06-02 MED ORDER — ACETAMINOPHEN 500 MG PO TABS
ORAL_TABLET | ORAL | Status: AC
  Administered 2018-06-02: 1000 mg via ORAL
  Filled 2018-06-02: qty 2

## 2018-06-02 MED ORDER — MIDAZOLAM HCL 2 MG/2ML IJ SOLN
INTRAMUSCULAR | Status: DC | PRN
  Administered 2018-06-02: 2 mg via INTRAVENOUS

## 2018-06-02 MED ORDER — SUGAMMADEX SODIUM 200 MG/2ML IV SOLN
INTRAVENOUS | Status: DC | PRN
  Administered 2018-06-02: 250 mg via INTRAVENOUS

## 2018-06-02 MED ORDER — LIDOCAINE HCL 1 % IJ SOLN
INTRAMUSCULAR | Status: DC | PRN
  Administered 2018-06-02: 30 mL

## 2018-06-02 MED ORDER — LIDOCAINE HCL (PF) 1 % IJ SOLN
INTRAMUSCULAR | Status: AC
  Filled 2018-06-02: qty 30

## 2018-06-02 MED ORDER — KETOROLAC TROMETHAMINE 30 MG/ML IJ SOLN
30.0000 mg | Freq: Once | INTRAMUSCULAR | Status: AC
  Administered 2018-06-02: 30 mg via INTRAVENOUS

## 2018-06-02 MED ORDER — KETOROLAC TROMETHAMINE 30 MG/ML IJ SOLN
INTRAMUSCULAR | Status: AC
  Filled 2018-06-02: qty 1

## 2018-06-02 MED ORDER — ONDANSETRON HCL 4 MG/2ML IJ SOLN
INTRAMUSCULAR | Status: AC
  Filled 2018-06-02: qty 2

## 2018-06-02 MED ORDER — GLIPIZIDE ER 2.5 MG PO TB24: 2.5000 mg | ORAL_TABLET | Freq: Every day | ORAL | Status: DC

## 2018-06-02 MED ORDER — OXYCODONE HCL 5 MG PO TABS
ORAL_TABLET | ORAL | Status: AC
  Administered 2018-06-02: 5 mg
  Filled 2018-06-02: qty 1

## 2018-06-02 MED ORDER — INSULIN ASPART 100 UNIT/ML ~~LOC~~ SOLN
SUBCUTANEOUS | Status: AC
  Administered 2018-06-02: 5 [IU] via SUBCUTANEOUS
  Filled 2018-06-02: qty 1

## 2018-06-02 SURGICAL SUPPLY — 38 items
ADH SKN CLS APL DERMABOND .7 (GAUZE/BANDAGES/DRESSINGS) ×1
APPLIER CLIP ROT 10 11.4 M/L (STAPLE) ×3
APR CLP MED LRG 11.4X10 (STAPLE) ×1
BAG SPEC RTRVL LRG 6X4 10 (ENDOMECHANICALS) ×1
BLADE CLIPPER SURG (BLADE) ×2 IMPLANT
CHLORAPREP W/TINT 26ML (MISCELLANEOUS) ×3 IMPLANT
CLIP APPLIE ROT 10 11.4 M/L (STAPLE) ×1 IMPLANT
DECANTER SPIKE VIAL GLASS SM (MISCELLANEOUS) ×6 IMPLANT
DERMABOND ADVANCED (GAUZE/BANDAGES/DRESSINGS) ×2
DERMABOND ADVANCED .7 DNX12 (GAUZE/BANDAGES/DRESSINGS) ×1 IMPLANT
DRESSING SURGICEL FIBRLLR 1X2 (HEMOSTASIS) IMPLANT
DRSG SURGICEL FIBRILLAR 1X2 (HEMOSTASIS)
ELECT REM PT RETURN 9FT ADLT (ELECTROSURGICAL) ×3
ELECTRODE REM PT RTRN 9FT ADLT (ELECTROSURGICAL) ×1 IMPLANT
GLOVE BIO SURGEON STRL SZ7 (GLOVE) ×5 IMPLANT
GLOVE BIOGEL PI IND STRL 7.5 (GLOVE) ×1 IMPLANT
GLOVE BIOGEL PI INDICATOR 7.5 (GLOVE) ×6
GOWN STRL REUS W/ TWL LRG LVL3 (GOWN DISPOSABLE) ×3 IMPLANT
GOWN STRL REUS W/TWL LRG LVL3 (GOWN DISPOSABLE) ×9
GRASPER SUT TROCAR 14GX15 (MISCELLANEOUS) ×3 IMPLANT
IRRIGATION STRYKERFLOW (MISCELLANEOUS) IMPLANT
IRRIGATOR STRYKERFLOW (MISCELLANEOUS)
IV NS 1000ML (IV SOLUTION) ×3
IV NS 1000ML BAXH (IV SOLUTION) ×1 IMPLANT
KIT TURNOVER KIT A (KITS) ×3 IMPLANT
NEEDLE HYPO 22GX1.5 SAFETY (NEEDLE) ×3 IMPLANT
NEEDLE INSUFFLATION 14GA 120MM (NEEDLE) ×3 IMPLANT
NS IRRIG 1000ML POUR BTL (IV SOLUTION) ×3 IMPLANT
PACK LAP CHOLECYSTECTOMY (MISCELLANEOUS) ×3 IMPLANT
POUCH SPECIMEN RETRIEVAL 10MM (ENDOMECHANICALS) ×3 IMPLANT
SCISSORS METZENBAUM CVD 33 (INSTRUMENTS) IMPLANT
SLEEVE ENDOPATH XCEL 5M (ENDOMECHANICALS) ×6 IMPLANT
SUT MNCRL AB 4-0 PS2 18 (SUTURE) ×3 IMPLANT
SUT VICRYL 0 UR6 27IN ABS (SUTURE) ×3 IMPLANT
SUT VICRYL AB 3-0 FS1 BRD 27IN (SUTURE) ×3 IMPLANT
TROCAR XCEL NON-BLD 11X100MML (ENDOMECHANICALS) ×3 IMPLANT
TROCAR XCEL NON-BLD 5MMX100MML (ENDOMECHANICALS) ×3 IMPLANT
TUBING INSUFFLATION (TUBING) ×3 IMPLANT

## 2018-06-02 NOTE — Progress Notes (Signed)
Pt laughing and telling jokes with Dr Tressia Miners, [facility] states he isnt in any pain, although he states his chest hurts, Pt going home with a friend, They are laughing during discharge instructions, Pt given instructions and prescription from Dr [name], and prescription for diabetes med from Dr Theodis Aguas states someone will contact him in am about glucose meter.

## 2018-06-02 NOTE — Telephone Encounter (Signed)
Patient's call was returned but no answer and message left.   The patient is on the surgery schedule with Dr. [name] for a lap chole today.   Caryl-Lyn had spoken to the patient on Friday regarding surgery and instructions.  Per Mikayla at Same Day Surgery, the patient did report to the hospital for surgery and is there now.

## 2018-06-02 NOTE — Discharge Instructions (Addendum)
In addition to included general post-operative instructions for Laparoscopic Cholecystectomy,  Diet: Resume home heart healthy diet.   Activity: No heavy lifting >20 pounds (children, pets, laundry, garbage) or strenuous activity until follow-up, but light activity and walking are encouraged. Do not drive or drink alcohol if taking narcotic pain medications.  Wound care: 2 days after surgery (Wednesday, 10/9), you may shower/get incision wet with soapy water and pat dry (do not rub incisions), but no baths or submerging incision underwater until follow-up.   Medications: Resume all home medications. For mild to moderate pain: ibuprofen/naproxen. Combining Tylenol with alcohol can substantially increase your risk of causing liver disease. Narcotic pain medications, if prescribed, can be used for severe pain, though may cause nausea, constipation, and drowsiness. Percocet contains Tylenol. If you do not need the narcotic pain medication, you do not need to fill the prescription.  Call office [phone]) at any time if any questions, worsening pain, fevers/chills, bleeding, drainage from incision site, or other concerns.  AMBULATORY SURGERY  DISCHARGE INSTRUCTIONS   1) The drugs that you were given will stay in your system until tomorrow so for the next 24 hours you should not:  A) Drive an automobile B) Make any legal decisions C) Drink any alcoholic beverage   2) You may resume regular meals tomorrow.  Today it is better to start with liquids and gradually work up to solid foods.  You may eat anything you prefer, but it is better to start with liquids, then soup and crackers, and gradually work up to solid foods.   3) Please notify your doctor immediately if you have any unusual bleeding, trouble breathing, redness and pain at the surgery site, drainage, fever, or pain not relieved by medication.    4) Additional Instructions:        Please contact your physician with any  problems or Same Day Surgery at [phone], Monday through Friday 6 am to 4 pm, or [place] at [facility] number at [phone].

## 2018-06-02 NOTE — Interval H&P Note (Signed)
History and Physical Interval Note:  06/02/2018 1:44 PM  Gerald Olson  has presented today for surgery, with the diagnosis of cholelithiasis  The various methods of treatment have been discussed with the patient and family. After consideration of risks, benefits and other options for treatment, the patient has consented to  Procedure(s): LAPAROSCOPIC CHOLECYSTECTOMY (N/A) as a surgical intervention .  The patient's history has been reviewed, patient examined, no change in status, stable for surgery.  I have reviewed the patient's chart and labs.  Questions were answered to the patient's satisfaction.     Vickie Epley

## 2018-06-02 NOTE — OR Nursing (Signed)
Notified Dr. Randa Lynn about the patient's blood sugar of 248. The patient medicates naturally. She said to leave it for now.

## 2018-06-02 NOTE — Anesthesia Post-op Follow-up Note (Signed)
Anesthesia QCDR form completed.        

## 2018-06-02 NOTE — Progress Notes (Signed)
Pt to have consult for Diabetes prior to Discharge,. Per DR Kayleen Memos and DR [name], labs ordered and Hospitalist to see pt.

## 2018-06-02 NOTE — Transfer of Care (Signed)
Immediate Anesthesia Transfer of Care Note  Patient: Gerald Olson  Procedure(s) Performed: LAPAROSCOPIC CHOLECYSTECTOMY (N/A Abdomen)  Patient Location: PACU  Anesthesia Type:General  Level of Consciousness: awake and alert   Airway & Oxygen Therapy: Patient Spontanous Breathing and Patient connected to face mask oxygen  Post-op Assessment: Report given to RN and Post -op Vital signs reviewed and stable  Post vital signs: Reviewed and stable  Last Vitals:  Vitals Value Taken Time  BP 152/95 06/02/2018  3:36 PM  Temp 36.4 C 06/02/2018  3:36 PM  Pulse 100 06/02/2018  3:37 PM  Resp 16 06/02/2018  3:37 PM  SpO2 97 % 06/02/2018  3:37 PM  Vitals shown include unvalidated device data.  Last Pain:  Vitals:   06/02/18 1536  TempSrc: Temporal         Complications: No apparent anesthesia complications

## 2018-06-02 NOTE — Progress Notes (Signed)
Pt arrived in pacu with 10/10 pain, Oxy IR given , pt states he and DR [name] discussed he does not have a true allergy to this medicine,,,Also pts blood sugar is 309 at this time, 5 of novolog given per Dr Leone Haven). Will recheck blood sugar in 30 minutes

## 2018-06-02 NOTE — Anesthesia Procedure Notes (Signed)
Procedure Name: Intubation Date/Time: 06/02/2018 2:11 PM Performed by: Justus Memory, CRNA Pre-anesthesia Checklist: Patient identified, Patient being monitored, Timeout performed, Emergency Drugs available and Suction available Patient Re-evaluated:Patient Re-evaluated prior to induction Oxygen Delivery Method: Circle system utilized Preoxygenation: Pre-oxygenation with 100% oxygen Induction Type: IV induction Ventilation: Mask ventilation without difficulty Laryngoscope Size: Mac and 3 Grade View: Grade II Tube type: Oral Tube size: 7.0 mm Number of attempts: 1 Airway Equipment and Method: Stylet Placement Confirmation: ETT inserted through vocal cords under direct vision,  positive ETCO2 and breath sounds checked- equal and bilateral Secured at: 21 cm Tube secured with: Tape Dental Injury: Teeth and Oropharynx as per pre-operative assessment

## 2018-06-02 NOTE — Telephone Encounter (Signed)
Patient has called and left a message on VM that he was returning a call to discuss his surgery date. No notes in chart at this time of the call. Please call patient at [phone]

## 2018-06-02 NOTE — Op Note (Signed)
SURGICAL OPERATIVE REPORT   DATE OF PROCEDURE: 06/02/2018  ATTENDING Surgeon(s): Vickie Epley, MD  ASSISTANT(S): Floyce Stakes, RNFA   ANESTHESIA: GETA  PRE-OPERATIVE DIAGNOSIS: Symptomatic Cholelithiasis (K80.20)  POST-OPERATIVE DIAGNOSIS: Symptomatic Cholelithiasis (K80.20)  PROCEDURE(S): (cpt's: 16109) 1.) Laparoscopic Cholecystectomy  INTRAOPERATIVE FINDINGS: Minimal pericholecystic inflammation with cystic duct and cystic artery clips well-secured, hemostasis at completion of procedure, visibly obvious hepatic nodularity, no ventral abdominal wall hernia appreciated on laparoscopy  INTRAOPERATIVE FLUIDS: 1000 mL crystalloid   ESTIMATED BLOOD LOSS: Minimal (<30 mL)   URINE OUTPUT: No foley  SPECIMENS: Gallbladder  IMPLANTS: None  DRAINS: None   COMPLICATIONS: None apparent   CONDITION AT COMPLETION: Hemodynamically stable and extubated  DISPOSITION: PACU   INDICATION(S) FOR PROCEDURE:  Patient is a 52 y.o. male who recently presented with post-prandial RUQ > epigastric abdominal pain after eating fatty foods in particular. Ultrasound suggested cholelithiasis without sonographic evidence to suggest cholecystitis. All risks, benefits, and alternatives to above elective procedures were discussed with the patient, who elected to proceed, and informed consent was accordingly obtained at that time.   DETAILS OF PROCEDURE:  Patient was brought to the operating suite and appropriately identified. General anesthesia was administered along with peri-operative prophylactic IV antibiotics, and endotracheal intubation was performed by anesthesiologist, along with NG/OG tube for gastric decompression. In supine position, operative site was prepped and draped in usual sterile fashion, and following a brief time out, initial 5 mm incision was made in a natural skin crease just above the umbilicus. Fascia was then elevated, and a Verress needle was inserted and its proper  position confirmed using aspiration and saline meniscus test.  Upon insufflation of the abdominal cavity with carbon dioxide to a well-tolerated pressure of 12-15 mmHg, 5 mm peri-umbilical port followed by laparoscope were inserted and used to inspect the abdominal cavity and its contents with no injuries from insertion of the first trochar noted. Three additional trocars were inserted, one at the epigastric position (10 mm) and two along the Right costal margin (5 mm). The table was then placed in reverse Trendelenburg position with the Right side up. Filmy adhesions between the gallbladder and omentum/duodenum/transverse colon were lysed using combined blunt dissection and selective electrocautery. The apex/dome of the gallbladder was grasped with an atraumatic grasper passed through the lateral port and retracted apically over the liver. The infundibulum was also grasped and retracted, exposing Calot's triangle. The peritoneum overlying the gallbladder infundibulum was incised and dissected free of surrounding peritoneal attachments, revealing the cystic duct and cystic artery, which were clipped twice on the patient side and once on the gallbladder specimen side close to the gallbladder. The gallbladder was then dissected from its peritoneal attachments to the liver using electrocautery. A small cholecystotomy was made in the thin gallbladder wall, from which thin clear yellow-green bile drained and was subsequently aspirated. The gallbladder was placed into a laparoscopic specimen bag and removed from the abdominal cavity via the epigastric port site. Hemostasis and secure placement of clips were confirmed, and intra-peritoneal cavity was inspected with no additional findings. PMI laparoscopic fascial closure device was then used to re-approximate fascia at the 10 mm epigastric port site.  All ports were then removed under direct visualization, and abdominal cavity was desuflated. All port sites were  irrigated/cleaned, additional local anesthetic was injected at each incision, 3-0 Vicryl was used to re-approximate dermis at 10 mm port site(s), and subcuticular 4-0 Monocryl suture was used to re-approximate skin. Skin was then cleaned, dried, and sterile skin  glue was applied. Patient was then safely able to be awakened, extubated, and transferred to PACU for post-operative monitoring and care.   I was present for all aspects of the above procedure, and no operative complications were apparent.

## 2018-06-02 NOTE — Anesthesia Preprocedure Evaluation (Signed)
Anesthesia Evaluation  Patient identified by MRN, date of birth, ID band Patient awake    Reviewed: Allergy & Precautions, NPO status , Patient's Chart, lab work & pertinent test results  History of Anesthesia Complications Negative for: history of anesthetic complications  Airway Mallampati: II  TM Distance: >3 FB Neck ROM: Full    Dental no notable dental hx.    Pulmonary neg sleep apnea, neg COPD, former smoker,    breath sounds clear to auscultation- rhonchi (-) wheezing      Cardiovascular Exercise Tolerance: Good (-) hypertension(-) CAD, (-) Past MI, (-) Cardiac Stents and (-) CABG  Rhythm:Regular Rate:Normal - Systolic murmurs and - Diastolic murmurs    Neuro/Psych  Headaches, PSYCHIATRIC DISORDERS    GI/Hepatic negative GI ROS, Neg liver ROS,   Endo/Other  diabetes (prediabetic, not on meds, using herbal supplements)  Renal/GU negative Renal ROS     Musculoskeletal  (+) Arthritis ,   Abdominal (+) + obese,   Peds  Hematology negative hematology ROS (+)   Anesthesia Other Findings Past Medical History: No date: Chronic pain     Comment:  back No date: Disc degeneration No date: Disc degeneration, lumbar No date: Headache   Reproductive/Obstetrics                             Anesthesia Physical Anesthesia Plan  ASA: II  Anesthesia Plan: General   Post-op Pain Management:    Induction: Intravenous  PONV Risk Score and Plan: 1 and Ondansetron and Midazolam  Airway Management Planned: Oral ETT  Additional Equipment:   Intra-op Plan:   Post-operative Plan: Extubation in OR  Informed Consent: I have reviewed the patients History and Physical, chart, labs and discussed the procedure including the risks, benefits and alternatives for the proposed anesthesia with the patient or authorized representative who has indicated his/her understanding and acceptance.   Dental  advisory given  Plan Discussed with: CRNA and Anesthesiologist  Anesthesia Plan Comments:         Anesthesia Quick Evaluation

## 2018-06-02 NOTE — Consult Note (Signed)
[facility] [place] NAME: Gerald Olson    MR#:  [id number]  DATE OF BIRTH:  [birth year]  DATE OF ADMISSION:  06/02/2018  PRIMARY CARE PHYSICIAN: Ladell Pier, MD   REQUESTING/REFERRING PHYSICIAN:  Dr. Tama High  CHIEF COMPLAINT:  No chief complaint on file.   HISTORY OF PRESENT ILLNESS:  Gerald Olson  is a 52 y.o. male with a known history of chronic back pain, degenerative disc disease, diabetes mellitus not on any medications, presents to hospital secondary to persistent right upper quadrant abdominal pain and gallstones.  Cholecystectomy done today and postoperatively sugars were noted in the high 200s and so medical consult was requested. Patient is followed by the [facility] in [place].  He was diagnosed with diabetes type 2 about 10 years ago.  He had significant side effects with metformin and so stopped taking it.  His average sugars at home have been between 203 100 range.  Recent visit with his PCP he was prescribed Amaryl 2 mg daily.  Patient states he has not filled his medication and wanted to try natural products and has been using over-the-counter natural products.  Does complain of some blurred vision but last eye exam was almost 2 years ago.  Patient states his glucometer is broken now.  Does not check his sugars at home and not taking any medications for his diabetes.  Continues to drink beers and some wine occasionally.  Has been taking Celebrex and ibuprofen for his back pain. He is in the postop area, status post cholecystectomy today.  Sugars are in the 200 range, received NovoLog in spite of which sugars have been still in the high 200s.  PAST MEDICAL HISTORY:   Past Medical History:  Diagnosis Date  . Chronic pain    back  . Disc degeneration   . Disc degeneration, lumbar   . Headache     PAST SURGICAL HISTOIRY:   Past Surgical History:  Procedure Laterality Date  . KNEE ARTHROSCOPY      30 Years ago    SOCIAL HISTORY:   Social History   Tobacco Use  . Smoking status: Former Research scientist (life sciences)  . Smokeless tobacco: Never Used  Substance Use Topics  . Alcohol use: Yes    Comment: cut back    FAMILY HISTORY:   Family History  Problem Relation Age of Onset  . Aneurysm Father     DRUG ALLERGIES:   Allergies  Allergen Reactions  . Hydrocodone Itching    Itching, face tingling, dizziness.  [name] [Oxycodone Hcl] Itching    REVIEW OF SYSTEMS:   Review of Systems  Constitutional: Positive for malaise/fatigue. Negative for chills, fever and weight loss.  HENT: Negative for ear discharge, ear pain, hearing loss and nosebleeds.   Eyes: Negative for blurred vision, double vision and photophobia.  Respiratory: Negative for cough, hemoptysis, shortness of breath and wheezing.   Cardiovascular: Negative for chest pain, palpitations, orthopnea and leg swelling.  Gastrointestinal: Positive for abdominal pain and nausea. Negative for constipation, diarrhea, heartburn, melena and vomiting.  Genitourinary: Negative for dysuria, frequency and urgency.  Musculoskeletal: Negative for back pain, myalgias and neck pain.  Skin: Negative for rash.  Neurological: Negative for dizziness, tingling, sensory change, speech change, focal weakness and headaches.  Endo/Heme/Allergies: Does not bruise/bleed easily.  Psychiatric/Behavioral: Negative for depression.    MEDICATIONS AT HOME:   Prior to Admission medications   Medication Sig Start Date End Date Taking? Authorizing  Provider  Ashwagandha 500 MG CAPS Take 1 capsule by mouth 3 (three) times daily.   Yes [provider]  BLACK CURRANT SEED OIL PO Take by mouth.   Yes [provider]  Cinnamon 500 MG capsule Take 1,000 mg by mouth 2 (two) times daily.   Yes [provider]  diclofenac sodium (VOLTAREN) 1 % GEL Apply 1 application topically 3 (three) times daily as needed (pain).   Yes [provider]  Flaxseed, Linseed, (FLAXSEED OIL) 1000 MG CAPS Take 3 capsules by mouth 2 (two) times daily.   Yes [provider]  ibuprofen (ADVIL,MOTRIN) 200 MG tablet Take 600 mg by mouth every 8 (eight) hours as needed (pain).   Yes [provider]  Multiple Vitamins-Minerals (MULTIVITAMIN WITH MINERALS) tablet Take 1 tablet by mouth daily.   Yes [provider]  naproxen sodium (ALEVE) 220 MG tablet Take 660 mg by mouth daily as needed (pain).   Yes [provider]  Nutritional Supplements (DHEA PO) Take 100 mg by mouth daily.   Yes [provider]  OVER THE COUNTER MEDICATION Take 1 drop by mouth 2 (two) times daily. CBD Oil   Yes [provider]  celecoxib (CELEBREX) 100 MG capsule Take 1 capsule (100 mg total) by mouth 2 (two) times daily. Patient not taking: Reported on 05/29/2018 04/14/18   Antony Blackbird, MD  glimepiride (AMARYL) 2 MG tablet Take 1 tablet (2 mg total) by mouth daily before breakfast. Patient not taking: Reported on 05/30/2018 04/25/18   Ladell Pier, MD  glucose blood (TRUE METRIX BLOOD GLUCOSE TEST) test strip Use as instructed 04/25/18   Ladell Pier, MD  methocarbamol (ROBAXIN) 500 MG tablet Take 1 tablet (500 mg total) by mouth 2 (two) times daily. Patient not taking: Reported on 05/30/2018 12/03/17   Hedges, Dellis Filbert, PA-C  ondansetron (ZOFRAN) 4 MG tablet Take 1 tablet (4 mg total) by mouth every 8 (eight) hours as needed for nausea or vomiting. Patient not taking: Reported on 05/30/2018 04/30/18   Fulp, Cammie, MD  oxyCODONE (ROXICODONE) 5 MG immediate release tablet Take 1 tablet (5 mg total) by mouth every 4 (four) hours as needed for severe pain. 06/02/18 06/02/19  Vickie Epley, MD  TRUEPLUS LANCETS 28G MISC Use as directed Patient not taking: Reported on 04/30/2018 04/25/18   Ladell Pier, MD      VITAL SIGNS:  Blood pressure (!) 156/90, pulse 95, temperature (!) 97 F (36.1 C), temperature  source Temporal, resp. rate 18, weight 106.1 kg, SpO2 98 %.  PHYSICAL EXAMINATION:   Physical Exam  GENERAL:  52 y.o.-year-old patient lying in the bed with no acute distress.  EYES: Pupils equal, round, reactive to light and accommodation. No scleral icterus. Extraocular muscles intact.  HEENT: Head atraumatic, normocephalic. Oropharynx and nasopharynx clear.  NECK:  Supple, no jugular venous distention. No thyroid enlargement, no tenderness.  LUNGS: Normal breath sounds bilaterally, no wheezing, rales,rhonchi or crepitation. No use of accessory muscles of respiration. Decreased bibasilar breath sounds CARDIOVASCULAR: S1, S2 normal. No murmurs, rubs, or gallops.  ABDOMEN: Soft, tender in midabdomen, nondistended. hypoactive Bowel sounds present. No organomegaly or mass.  EXTREMITIES: No pedal edema, cyanosis, or clubbing.  NEUROLOGIC: Cranial nerves II through XII are intact. Muscle strength 5/5 in all extremities. Sensation intact. Gait not checked.  PSYCHIATRIC: The patient is alert and oriented x 3.  SKIN: No obvious rash, lesion, or ulcer.   LABORATORY PANEL:   CBC Recent Labs  Lab 06/02/18 1148  WBC 7.3  HGB 15.3  HCT 44.9  PLT 252   ------------------------------------------------------------------------------------------------------------------  Chemistries  Recent Labs  Lab 06/02/18 1148  NA 133*  K 3.9  CL 96*  CO2 24  GLUCOSE 285*  BUN 17  CREATININE 0.89  CALCIUM 9.1  AST 54*  ALT 43  ALKPHOS 82  BILITOT 1.0   ------------------------------------------------------------------------------------------------------------------  Cardiac Enzymes No results for input(s): TROPONINI in the last 168 hours. ------------------------------------------------------------------------------------------------------------------  RADIOLOGY:  No results found.  EKG:  No orders found for this or any previous visit.  IMPRESSION AND PLAN:   Gerald Olson  is a 52 y.o.  male with a known history of chronic back pain, degenerative disc disease, diabetes mellitus not on any medications, presents to hospital secondary to persistent right upper quadrant abdominal pain and gallstones.  Cholecystectomy done today and postoperatively sugars were noted in the high 200s and so medical consult was requested.  1.  Uncontrolled diabetes mellitus with hyperglycemia-patient noncompliant with his medications as outpatient. -Received a prescription for glucometer and Amaryl 2 mg daily by his PCP about 5 weeks ago, however patient did not fill it or has started using it. -His last A1c from then was 9.2. -Complains of side effects with metformin.  Taking herbal stuff. -Strongly advised to restart the Amaryl and see how his sugars are, discussed with case manager to see if a free glucometer can be dispensed to him. -Consider adding Januvia or a second agent for his Amaryl if sugars are not well controlled within 3 weeks. -Will need a podiatry and ophthalmology visit as outpatient.  2.  Acute cholecystitis, has gallstones with significant biliary colic. -Status post laparoscopic cholecystectomy. -Management first general surgery.  Patient will be discharged today  3.  Alcohol abuse-strongly counseled.  AST elevated.  Liver with fatty liver changes  4. Hypertension- elevated from pain, will benefit from ACEI or ARB- will advice f/u with PCP follow up.  Can be discharged-an outpatient follow-up needed.    All the records are reviewed and case discussed with Consulting provider. Management plans discussed with the patient, family and they are in agreement.  CODE STATUS: Full Code  TOTAL TIME TAKING CARE OF THIS PATIENT: 50 minutes.    Gerald Olson M.D on 06/02/2018 at 5:55 PM  Between 7am to 6pm - Pager - [phone]  After 6pm go to [email or web address] - password [email or web address] [facility]  [facility]  Office  [phone]  CC: Primary care Physician: Ladell Pier, MD

## 2018-06-03 NOTE — Care Management (Signed)
Late note entry: RNCM received notification from provider late yesterday that patient does not have a glucometer and limited funds status post gallbladder removal. I have emailed patient at [email or web address] with information about Glucometer at [facility] or one from the [facility] department if he cannot afford that.  I have shared a link to [facility] also.  Patient advised to call RNCM for further assistance.

## 2018-06-04 ENCOUNTER — Ambulatory Visit (INDEPENDENT_AMBULATORY_CARE_PROVIDER_SITE_OTHER): Payer: No Typology Code available for payment source

## 2018-06-04 ENCOUNTER — Encounter: Payer: Self-pay | Admitting: Podiatry

## 2018-06-04 ENCOUNTER — Ambulatory Visit: Payer: No Typology Code available for payment source | Admitting: Podiatry

## 2018-06-04 VITALS — BP 117/85 | HR 99

## 2018-06-04 DIAGNOSIS — M5416 Radiculopathy, lumbar region: Secondary | ICD-10-CM

## 2018-06-04 DIAGNOSIS — M79672 Pain in left foot: Secondary | ICD-10-CM

## 2018-06-04 LAB — SURGICAL PATHOLOGY

## 2018-06-04 NOTE — Anesthesia Postprocedure Evaluation (Signed)
Anesthesia Post Note  Patient: Gerald Olson  Procedure(s) Performed: LAPAROSCOPIC CHOLECYSTECTOMY (N/A Abdomen)  Patient location during evaluation: PACU Anesthesia Type: General Level of consciousness: awake and alert and oriented Pain management: pain level controlled Vital Signs Assessment: post-procedure vital signs reviewed and stable Respiratory status: spontaneous breathing Cardiovascular status: blood pressure returned to baseline Anesthetic complications: no     Last Vitals:  Vitals:   06/02/18 1717 06/02/18 1809  BP: (!) 156/90 (!) 157/70  Pulse:  94  Resp: 18 18  Temp:  (!) 36.1 C  SpO2: 98% 98%    Last Pain:  Vitals:   06/03/18 0826  TempSrc:   PainSc: 9                  Jeff Mccallum

## 2018-06-06 DIAGNOSIS — K8020 Calculus of gallbladder without cholecystitis without obstruction: Secondary | ICD-10-CM
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
## 2018-06-17 ENCOUNTER — Encounter: Payer: Self-pay | Admitting: Surgery

## 2018-06-17 ENCOUNTER — Ambulatory Visit (INDEPENDENT_AMBULATORY_CARE_PROVIDER_SITE_OTHER): Payer: Self-pay | Admitting: Surgery

## 2018-06-17 VITALS — BP 176/109 | HR 106 | Temp 97.9°F | Resp 12 | Ht 75.0 in | Wt 238.0 lb

## 2018-06-17 DIAGNOSIS — Z4889 Encounter for other specified surgical aftercare: Secondary | ICD-10-CM

## 2018-06-17 DIAGNOSIS — K7030 Alcoholic cirrhosis of liver without ascites: Secondary | ICD-10-CM

## 2018-06-17 DIAGNOSIS — K8020 Calculus of gallbladder without cholecystitis without obstruction: Secondary | ICD-10-CM

## 2018-06-17 NOTE — Progress Notes (Addendum)
Surgical Clinic Progress/Follow-up Note   HPI:  52 y.o. Male presents to clinic for post-op follow-up 15 Days s/p laparoscopic cholecystectomy Gerald Olson, 06/02/2018). Patient reports complete resolution of pre-operative RUQ abdominal pain that made him feel like he needed to lay down due to his pain after eating fattier foods, and he has been tolerating regular diet with +flatus and more normal BM's than pre-op (chronic constipation due to narcotic pain medications for chronic lower back pain), denies N/V, fever/chills, CP, or SOB. He continues to complain of chronic back pain with lower extremity paresthesias and continues to drink alcohol, reportedly for pain relief, though says he's cut back due to having been told after cholecystectomy that he has visible liver nodularity.  Review of Systems:  Constitutional: denies fever/chills  Respiratory: denies shortness of breath, wheezing  Cardiovascular: denies chest pain, palpitations  Gastrointestinal: abdominal pain, N/V, and bowel function as per interval history Skin: Denies any other rashes or skin discolorations except post-surgical wounds as per interval history  Vital Signs:  BP (!) 176/109   Pulse (!) 106   Temp 97.9 F (36.6 C) (Skin)   Resp 12   Ht 6\' 3"  (1.905 m)   Wt 238 lb (108 kg)   BMI 29.75 kg/m    Physical Exam:  Constitutional:  -- Normal body habitus  -- Awake, alert, and oriented x3  Pulmonary:  -- No crackles -- Equal breath sounds bilaterally -- Breathing non-labored at rest Cardiovascular:  -- S1, S2 present  -- No pericardial rubs  Gastrointestinal:  -- Soft and non-distended with minimal peri-epigastric peri-incisional tenderness to palpation, no guarding/rebound tenderness -- Post-surgical incisions all well-approximated without any peri-incisional erythema or drainage -- No abdominal masses appreciated, pulsatile or otherwise  Musculoskeletal / Integumentary:  -- Wounds or skin discoloration: None  appreciated except post-surgical incisions as described above (GI) -- Extremities: B/L UE and LE FROM, hands and feet warm, no edema   Imaging: No new pertinent imaging available for review  Assessment:  52 y.o. yo Male with a problem list including...  Patient Active Problem List   Diagnosis Date Noted  . Calculus of gallbladder without cholecystitis without obstruction   . Uncontrolled type 2 diabetes mellitus with hyperglycemia ([place]) 04/25/2018  . Alcohol use disorder, mild, abuse 04/25/2018    presents to clinic for post-op follow-up evaluation, doing well 15 Days s/p laparoscopic cholecystectomy Gerald Olson, 06/02/2018) for symptomatic cholelithiasis.  Plan:              - advance diet as tolerated              - okay to submerge incisions under water (baths, swimming) prn             - gradually resume all activities without restrictions over next 2 weeks             - apply sunblock particularly to incisions with sun exposure to reduce pigmentation of scars             - return to clinic as needed, instructed to call office if any questions or concerns  - importance of avoiding hepatotoxins, including alcohol, again discussed  All of the above recommendations were discussed with the patient, and all of patient's questions were answered to his expressed satisfaction.  -- Marilynne Drivers Gerald Hoes, MD, [place]: [facility] - Partnering for exceptional care. Office: [phone]

## 2018-06-17 NOTE — Patient Instructions (Signed)
Please do not drink any more alcohol to prevent your cirrhosis to progress.  Please give [place] a call in case you have any questions or concerns.

## 2018-06-24 ENCOUNTER — Ambulatory Visit: Payer: Medicaid Other | Attending: Family Medicine | Admitting: Physician Assistant

## 2018-06-24 VITALS — BP 146/97 | HR 105 | Temp 99.9°F | Resp 18 | Ht 74.0 in | Wt 235.0 lb

## 2018-06-24 DIAGNOSIS — G8929 Other chronic pain: Secondary | ICD-10-CM | POA: Diagnosis not present

## 2018-06-24 DIAGNOSIS — Z79899 Other long term (current) drug therapy: Secondary | ICD-10-CM | POA: Insufficient documentation

## 2018-06-24 DIAGNOSIS — E119 Type 2 diabetes mellitus without complications: Secondary | ICD-10-CM | POA: Insufficient documentation

## 2018-06-24 DIAGNOSIS — Z7984 Long term (current) use of oral hypoglycemic drugs: Secondary | ICD-10-CM | POA: Insufficient documentation

## 2018-06-24 DIAGNOSIS — Z791 Long term (current) use of non-steroidal anti-inflammatories (NSAID): Secondary | ICD-10-CM | POA: Diagnosis not present

## 2018-06-24 DIAGNOSIS — M545 Low back pain, unspecified: Secondary | ICD-10-CM

## 2018-06-24 DIAGNOSIS — F329 Major depressive disorder, single episode, unspecified: Secondary | ICD-10-CM | POA: Diagnosis not present

## 2018-06-24 DIAGNOSIS — Z09 Encounter for follow-up examination after completed treatment for conditions other than malignant neoplasm: Secondary | ICD-10-CM

## 2018-06-24 DIAGNOSIS — I10 Essential (primary) hypertension: Secondary | ICD-10-CM | POA: Diagnosis not present

## 2018-06-24 DIAGNOSIS — M4726 Other spondylosis with radiculopathy, lumbar region: Secondary | ICD-10-CM

## 2018-06-24 MED ORDER — CELECOXIB 400 MG PO CAPS: 400.0000 mg | ORAL_CAPSULE | Freq: Every day | ORAL | 3 refills | Status: DC

## 2018-06-24 NOTE — Progress Notes (Signed)
Chief Complaint: Post op follow up  Subjective: This is a 52 year old male with a history of hypertension, diabetes mellitus type 2, chronic back pain, alcohol abuse and more recently right upper quadrant pain.  He recently was referred to general surgery for gallstones and underwent laparoscopic cholecystectomy on 06/02/2018 by Dr. [name].  His post operative course seem to be complicated by elevated blood pressure and elevated blood sugars.  He did not have to be admitted.  The hospitalist did see him during his visit.  He was encouraged to take Amaryl 2 mg daily for which she had not been doing and potentially would need an additional agent at some point.  He has seen surgery for follow-up on 06/17/2018 with good progress.  He has continue to advance his diet.  No constipation.  No diarrhea.  Still a little sore.  His biggest complaint today is back pain.  He states his initial injury occurred 20 years ago and has had chronic pain since then. Has seen a chiropractor in past. Xray of lumbar spine as recent as April show DJD at L5/S1 level. His symptoms have been a little worsened by his postoperative course.  He has had decreased mobility.  Has had decreased sleeping.  He describes a sharp sensation at times a burning sensation at times and occasionally a stabbing sensation.  He has been self-medicating with beer.  Celebrex helps a little bit.  Of note his PHQ 9 score was elevated.  He does not wish to meet with social worker start any antidepressants at this time.   ROS:  GEN: denies fever or chills, denies change in weight Skin: denies lesions or rashes HEENT: denies headache, earache, epistaxis, sore throat, or neck pain LUNGS: denies SHOB, dyspnea, PND, orthopnea CV: denies CP or palpitations ABD: denies abd pain, N or V EXT: denies muscle spasms or swelling; no pain in lower ext, no weakness NEURO: denies numbness or tingling, denies sz, stroke or TIA   Objective:  Vitals:   06/24/18  1421  BP: (!) 146/97  Pulse: (!) 105  Resp: 18  Temp: 99.9 F (37.7 C)  TempSrc: Oral  SpO2: 98%  Weight: 235 lb (106.6 kg)  Height: 6\' 2"  (1.88 m)    Physical Exam:  General: in no acute distress. HEENT: no pallor, no icterus, moist oral mucosa, no JVD, no lymphadenopathy Heart: Normal  s1 &s2  Regular rate and rhythm, without murmurs, rubs, gallops. Lungs: Clear to auscultation bilaterally. Abdomen: Soft, nontender, nondistended, positive bowel sounds. Extremities:difficult to assess due to back pain Neuro: Alert, awake, oriented x3, nonfocal.   Medications: Prior to Admission medications   Medication Sig Start Date End Date Taking? Authorizing Provider  Ashwagandha 500 MG CAPS Take 1 capsule by mouth 3 (three) times daily.   Yes [provider]  BLACK CURRANT SEED OIL PO Take by mouth.   Yes [provider]  celecoxib (CELEBREX) 100 MG capsule Take 1 capsule (100 mg total) by mouth 2 (two) times daily. 04/14/18  Yes Fulp, Cammie, MD  Cinnamon 500 MG capsule Take 1,000 mg by mouth 2 (two) times daily.   Yes [provider]  diclofenac sodium (VOLTAREN) 1 % GEL Apply 1 application topically 3 (three) times daily as needed (pain).   Yes [provider]  glimepiride (AMARYL) 2 MG tablet Take 1 tablet (2 mg total) by mouth daily before breakfast. 04/25/18  Yes Ladell Pier, MD  glucose blood (TRUE METRIX BLOOD GLUCOSE TEST) test strip Use as  instructed 04/25/18  Yes Ladell Pier, MD  ibuprofen (ADVIL,MOTRIN) 200 MG tablet Take 600 mg by mouth every 8 (eight) hours as needed (pain).   Yes [provider]  methocarbamol (ROBAXIN) 500 MG tablet Take 1 tablet (500 mg total) by mouth 2 (two) times daily. 12/03/17  Yes Hedges, Dellis Filbert, PA-C  Multiple Vitamins-Minerals (MULTIVITAMIN WITH MINERALS) tablet Take 1 tablet by mouth daily.   Yes [provider]  naproxen sodium (ALEVE) 220 MG tablet Take 660 mg by mouth daily as needed  (pain).   Yes [provider]  Nutritional Supplements (DHEA PO) Take 100 mg by mouth daily.   Yes [provider]  ondansetron (ZOFRAN) 4 MG tablet Take 1 tablet (4 mg total) by mouth every 8 (eight) hours as needed for nausea or vomiting. 04/30/18  Yes Fulp, Cammie, MD  OVER THE COUNTER MEDICATION Take 1 drop by mouth 2 (two) times daily. CBD Oil   Yes [provider]  TRUEPLUS LANCETS 28G MISC Use as directed 04/25/18  Yes Ladell Pier, MD    Assessment: 1. Acute on chronic lumbar back pain 2. Gallstones s/p lap chol 10/19 3. ETOH excess 4. HTN 5. Depression 6. DM 2  Plan: Celebrex Ortho surgery referral Declines talk with SW or starting meds for depression Believes his BP is being driven by pain and does not wish to take meds for now Low salt diet Inc activity as tolerated Cont Amaryl  Follow up:3 mo  The patient was given clear instructions to go to ER or return to medical center if symptoms don't improve, worsen or new problems develop. The patient verbalized understanding. The patient was told to call to get lab results if they haven't heard anything in the next week.   This note has been created with Surveyor, quantity. Any transcriptional errors are unintentional.   Zettie Pho, PA-C 06/24/2018, 2:28 PM

## 2018-07-03 ENCOUNTER — Ambulatory Visit (INDEPENDENT_AMBULATORY_CARE_PROVIDER_SITE_OTHER): Payer: Self-pay | Admitting: Surgery

## 2018-07-03 ENCOUNTER — Encounter (INDEPENDENT_AMBULATORY_CARE_PROVIDER_SITE_OTHER): Payer: Self-pay | Admitting: Surgery

## 2018-07-03 ENCOUNTER — Ambulatory Visit (INDEPENDENT_AMBULATORY_CARE_PROVIDER_SITE_OTHER): Payer: Self-pay

## 2018-07-03 VITALS — BP 140/85 | HR 98 | Temp 98.8°F | Ht 74.0 in | Wt 235.0 lb

## 2018-07-03 DIAGNOSIS — M5416 Radiculopathy, lumbar region: Secondary | ICD-10-CM

## 2018-07-03 DIAGNOSIS — M4317 Spondylolisthesis, lumbosacral region: Secondary | ICD-10-CM

## 2018-07-03 NOTE — Progress Notes (Signed)
Office Visit Note   Patient: Gerald Olson           Date of Birth: 1965/09/08           MRN: [id number] Visit Date: 07/03/2018              Requested by: Brayton Caves, PA-C [address] [place], [place] [address] PCP: Antony Blackbird, MD   Assessment & Plan: Visit Diagnoses:  1. Lumbar radiculopathy, chronic   2. Spondylolisthesis of lumbosacral region     Plan: At this point with patient's progressively worsening pain, lower extremity radiculopathy which includes feeling of bilateral leg weakness and failed conservative treatment I recommend getting a lumbar MRI to rule out HNP/stenosis.  Follow-up in the office with Dr. [name] after completion to discuss results and further treatment options.  I did review lumbar x-rays with patient.  Follow-Up Instructions: Return in about 3 weeks (around 07/24/2018) for WITH DR YATES TO REVIEW LUMBAR MRI.   Orders:  Orders Placed This Encounter  Procedures  . XR Lumbar Spine Complete  . MR Lumbar Spine w/o contrast   No orders of the defined types were placed in this encounter.     Procedures: No procedures performed   Clinical Data: No additional findings.   Subjective: Chief Complaint  Patient presents with  . Lower Back - Pain  . Spine - Pain    HPI 52 year old white male who is new patient to the office comes in with complaints of worsening low back pain and left lower extremity radiculopathy with feeling of bilateral leg weakness.  Patient has had problems with lumbar degenerative disc disease for several years and has had treatment by multiple providers.  At one point states that it was mentioned that he would need to see a neurosurgeon but there was some issues with his insurance and he was not able to do so.  Describes having pain numbness and tingling radiating down to the left foot and also feeling of bilateral leg weakness.  States that he has had multiple falls due to the weakness.  He has had conservative management  with recent prednisone taper that did not give any improvement.  Is also tried Celebrex, home exercises, chiropractic treatments without any relief.  He states that my "L5-S1 disc is gone".  Patient was seen at the emergency room December 03, 2017 for issues with back pain and was given Robaxin, Toradol IM injection.  He has not had a recent MRI. Review of Systems No current cardiac pulmonary GI GU issues  Objective: Vital Signs: BP 140/85 (BP Location: Left Arm, Patient Position: Sitting, Cuff Size: Normal)   Pulse 98   Temp 98.8 F (37.1 C) (Oral)   Ht 6\' 2"  (1.88 m)   Wt 235 lb (106.6 kg)   BMI 30.17 kg/m   Physical Exam  Constitutional: He is oriented to person, place, and time. No distress.  HENT:  Head: Normocephalic.  Eyes: Pupils are equal, round, and reactive to light. EOM are normal.  Pulmonary/Chest: No respiratory distress.  Musculoskeletal:  Gait is antalgic.  Pain with lumbar flexion extension.  Positive sciatic notch tenderness.  Bilateral lumbar paraspinal tenderness.  Negative logroll.  Pain with bilateral straight leg raise.  Question left-sided anterior tib, gastroc, quad weakness.  Bilateral calves nontender.  Neurological: He is alert and oriented to person, place, and time.    Ortho Exam  Specialty Comments:  No specialty comments available.  Imaging: No results found.   [place]  History: Patient Active Problem List   Diagnosis Date Noted  . Alcoholic cirrhosis of liver ([place]) 06/02/2018  . Uncontrolled type 2 diabetes mellitus with hyperglycemia ([place]) 04/25/2018  . Alcohol use disorder, mild, abuse 04/25/2018   Past Medical History:  Diagnosis Date  . Calculus of gallbladder without cholecystitis without obstruction   . Chronic pain    back  . Disc degeneration   . Disc degeneration, lumbar   . Headache     Family History  Problem Relation Age of Onset  . Aneurysm Father     Past Surgical History:  Procedure Laterality Date  . CHOLECYSTECTOMY N/A  06/02/2018   Procedure: LAPAROSCOPIC CHOLECYSTECTOMY;  Surgeon: Vickie Epley, MD;  Location: [facility] ORS;  Service: General;  Laterality: N/A;  . KNEE ARTHROSCOPY     30 Years ago   Social History   Occupational History  . Not on file  Tobacco Use  . Smoking status: Former Research scientist (life sciences)  . Smokeless tobacco: Never Used  Substance and Sexual Activity  . Alcohol use: Yes    Comment: cut back  . Drug use: No  . Sexual activity: Yes

## 2018-07-15 ENCOUNTER — Telehealth (INDEPENDENT_AMBULATORY_CARE_PROVIDER_SITE_OTHER): Payer: Self-pay | Admitting: Orthopaedic Surgery

## 2018-07-15 NOTE — Telephone Encounter (Signed)
Called patient left message on voicemail to return call to schedule his MRI review with Dr. [name]. MRI scheduled for 07/20/18

## 2018-07-16 ENCOUNTER — Telehealth: Payer: Self-pay

## 2018-07-16 NOTE — Telephone Encounter (Signed)
As per Gerald Olson, [facility], they are still working with the patient.

## 2018-07-20 ENCOUNTER — Ambulatory Visit
Admission: RE | Admit: 2018-07-20 | Discharge: 2018-07-20 | Disposition: A | Discharge status: Home / Self Care | Payer: Self-pay | Source: Ambulatory Visit | Attending: Surgery | Admitting: Surgery

## 2018-07-20 DIAGNOSIS — M4317 Spondylolisthesis, lumbosacral region: Secondary | ICD-10-CM

## 2018-07-22 ENCOUNTER — Ambulatory Visit (INDEPENDENT_AMBULATORY_CARE_PROVIDER_SITE_OTHER): Payer: Self-pay | Admitting: Orthopaedic Surgery

## 2018-07-22 ENCOUNTER — Encounter (INDEPENDENT_AMBULATORY_CARE_PROVIDER_SITE_OTHER): Payer: Self-pay | Admitting: Orthopaedic Surgery

## 2018-07-22 ENCOUNTER — Telehealth (INDEPENDENT_AMBULATORY_CARE_PROVIDER_SITE_OTHER): Payer: Self-pay | Admitting: Orthopaedic Surgery

## 2018-07-22 VITALS — BP 165/106 | HR 100 | Ht 74.0 in | Wt 235.0 lb

## 2018-07-22 DIAGNOSIS — M48061 Spinal stenosis, lumbar region without neurogenic claudication: Secondary | ICD-10-CM

## 2018-07-22 NOTE — Telephone Encounter (Signed)
Called patient left message on voicemail to return call to schedule an appointment for MRI review with Dr [name]     MRI was scheduled for 07/20/18

## 2018-07-22 NOTE — Progress Notes (Signed)
Office Visit Note   Patient: Gerald Olson           Date of Birth: Feb 13, 1966           MRN: [id number] Visit Date: 07/22/2018              Requested by: Antony Blackbird, MD [place], [place] [address] PCP: Antony Blackbird, MD   Assessment & Plan: Visit Diagnoses:  1. Lumbar foraminal stenosis     Plan: We will set patient up for L5-S1 injection with Dr. Ernestina Patches.  Has advanced facet arthropathy with anterolisthesis and some bilateral L5 nerve root flattening.  He has some mild degenerative changes at other levels without compression.  In the distant past he had prednisone packs in the past that gave him some relief.  Hopefully he will respond well to the epidural.  He can follow-up with me in 2 months.  Follow-Up Instructions: Return in about 2 months (around 09/21/2018).   Orders:  No orders of the defined types were placed in this encounter.  No orders of the defined types were placed in this encounter.     Procedures: No procedures performed   Clinical Data: No additional findings.   Subjective: Chief Complaint  Patient presents with  . Lower Back - Pain, Follow-up    MRI Lumbar Review    HPI 52 year old male with low back pain radiates into his right hip and right thigh.  Patient been treated with prednisone pack that did not give him improvement he is used Celebrex exercise program, chiropractic treatment.  MRI scan is been obtained and is available for review.  Patient has been ambulating with a cane.  Review of Systems negative for cardiac GI GU issues.  Positive for type 2 diabetes alcohol disorder with mild abuse history of alcoholic cirrhosis of liver.   Objective: Vital Signs: BP (!) 165/106   Pulse 100   Ht 6\' 2"  (1.88 m)   Wt 235 lb (106.6 kg)   BMI 30.17 kg/m   Physical Exam  Constitutional: He is oriented to person, place, and time. He appears well-developed and well-nourished.  HENT:  Head: Normocephalic and atraumatic.  Eyes:  Pupils are equal, round, and reactive to light. EOM are normal.  Neck: No tracheal deviation present. No thyromegaly present.  Cardiovascular: Normal rate.  Pulmonary/Chest: Effort normal. He has no wheezes.  Abdominal: Soft. Bowel sounds are normal.  Neurological: He is alert and oriented to person, place, and time.  Skin: Skin is warm and dry. Capillary refill takes less than 2 seconds.  Psychiatric: He has a normal mood and affect. His behavior is normal. Judgment and thought content normal.    Ortho Exam patient has sciatic notch tenderness.  Ambulates with a limp slow to get on and off the exam table.  Negative logroll to the hips and complains of pain with lumbar flexion extension.  Specialty Comments:  No specialty comments available.  Imaging: Study Result   CLINICAL DATA:  Lumbosacral spondylolisthesis.  EXAM: MRI LUMBAR SPINE WITHOUT CONTRAST  TECHNIQUE: Multiplanar, multisequence MR imaging of the lumbar spine was performed. No intravenous contrast was administered.  COMPARISON:  Radiography 07/03/2018  FINDINGS: Segmentation:  5 lumbar type vertebral bodies  Alignment:  Grade 1 anterolisthesis at L5-S1  Vertebrae:  No fracture, evidence of discitis, or bone lesion.  Conus medullaris and cauda equina: Conus extends to the T12-L1 level. Conus and cauda equina appear normal.  Paraspinal and other soft tissues: Negative  Disc levels:  T12- L1: Spondylosis and disc narrowing without impingement  L1-L2: Spondylosis and disc narrowing with small left paracentral protrusion.  L2-L3: Unremarkable.  L3-L4: Disc narrowing and bulging.  No impingement  L4-L5: Degenerative facet spurring. The disc is bulging with annular fissure at the left foramen.  L5-S1:Severe facet degeneration with bulky hypertrophy on the right. There is anterolisthesis. Greatest level of degenerative disc narrowing with biforaminal height loss and bulge causing  right foraminal impingement with L5 flattening. There is bilateral subarticular recess stenosis and S1 impingement.  IMPRESSION: 1. L5-S1 advanced facet arthropathy with anterolisthesis and right L5 and bilateral S1 impingement. 2. Noncompressive degenerative changes at the other levels as described above.   Electronically Signed   By: Monte Fantasia M.D.   On: 07/21/2018 09:06       PMFS History: Patient Active Problem List   Diagnosis Date Noted  . Alcoholic cirrhosis of liver ([place]) 06/02/2018  . Uncontrolled type 2 diabetes mellitus with hyperglycemia ([place]) 04/25/2018  . Alcohol use disorder, mild, abuse 04/25/2018   Past Medical History:  Diagnosis Date  . Calculus of gallbladder without cholecystitis without obstruction   . Chronic pain    back  . Disc degeneration   . Disc degeneration, lumbar   . Headache     Family History  Problem Relation Age of Onset  . Aneurysm Father     Past Surgical History:  Procedure Laterality Date  . CHOLECYSTECTOMY N/A 06/02/2018   Procedure: LAPAROSCOPIC CHOLECYSTECTOMY;  Surgeon: Vickie Epley, MD;  Location: [facility] ORS;  Service: General;  Laterality: N/A;  . KNEE ARTHROSCOPY     30 Years ago   Social History   Occupational History  . Not on file  Tobacco Use  . Smoking status: Former Research scientist (life sciences)  . Smokeless tobacco: Never Used  Substance and Sexual Activity  . Alcohol use: Yes    Comment: cut back  . Drug use: No  . Sexual activity: Yes

## 2018-07-31 ENCOUNTER — Ambulatory Visit: Payer: Self-pay | Admitting: Family Medicine

## 2018-07-31 ENCOUNTER — Ambulatory Visit: Payer: Self-pay | Admitting: Internal Medicine

## 2018-08-06 ENCOUNTER — Encounter: Payer: Self-pay | Admitting: Family Medicine

## 2018-08-06 ENCOUNTER — Ambulatory Visit: Payer: Medicaid Other | Attending: Family Medicine | Admitting: Family Medicine

## 2018-08-06 VITALS — BP 142/97 | HR 120 | Temp 98.2°F | Resp 18 | Ht 73.5 in | Wt 234.0 lb

## 2018-08-06 DIAGNOSIS — E1165 Type 2 diabetes mellitus with hyperglycemia: Secondary | ICD-10-CM | POA: Diagnosis not present

## 2018-08-06 DIAGNOSIS — Z79899 Other long term (current) drug therapy: Secondary | ICD-10-CM | POA: Insufficient documentation

## 2018-08-06 DIAGNOSIS — Z885 Allergy status to narcotic agent status: Secondary | ICD-10-CM | POA: Insufficient documentation

## 2018-08-06 DIAGNOSIS — M5442 Lumbago with sciatica, left side: Secondary | ICD-10-CM | POA: Diagnosis not present

## 2018-08-06 DIAGNOSIS — M5136 Other intervertebral disc degeneration, lumbar region: Secondary | ICD-10-CM | POA: Insufficient documentation

## 2018-08-06 DIAGNOSIS — M542 Cervicalgia: Secondary | ICD-10-CM | POA: Diagnosis not present

## 2018-08-06 DIAGNOSIS — G894 Chronic pain syndrome: Secondary | ICD-10-CM

## 2018-08-06 DIAGNOSIS — E1142 Type 2 diabetes mellitus with diabetic polyneuropathy: Secondary | ICD-10-CM | POA: Diagnosis not present

## 2018-08-06 DIAGNOSIS — H538 Other visual disturbances: Secondary | ICD-10-CM | POA: Insufficient documentation

## 2018-08-06 DIAGNOSIS — M5441 Lumbago with sciatica, right side: Secondary | ICD-10-CM

## 2018-08-06 DIAGNOSIS — H547 Unspecified visual loss: Secondary | ICD-10-CM

## 2018-08-06 DIAGNOSIS — Z7984 Long term (current) use of oral hypoglycemic drugs: Secondary | ICD-10-CM | POA: Diagnosis not present

## 2018-08-06 DIAGNOSIS — Z9049 Acquired absence of other specified parts of digestive tract: Secondary | ICD-10-CM | POA: Insufficient documentation

## 2018-08-06 DIAGNOSIS — Z87891 Personal history of nicotine dependence: Secondary | ICD-10-CM | POA: Insufficient documentation

## 2018-08-06 DIAGNOSIS — G8929 Other chronic pain: Secondary | ICD-10-CM | POA: Diagnosis not present

## 2018-08-06 DIAGNOSIS — K7030 Alcoholic cirrhosis of liver without ascites: Secondary | ICD-10-CM | POA: Diagnosis not present

## 2018-08-06 LAB — GLUCOSE, POCT (MANUAL RESULT ENTRY): POC Glucose: 362 mg/dL — AB (ref 70–99)

## 2018-08-06 MED ORDER — CELECOXIB 200 MG PO CAPS: 200.0000 mg | ORAL_CAPSULE | Freq: Two times a day (BID) | ORAL | 6 refills | Status: DC

## 2018-08-06 MED FILL — ?CELECOXIB 200MG CAP: 200 | 30 days supply | Qty: 60 | Fill #0

## 2018-08-06 MED FILL — TRUEplus LANCETS 28G MISC: 30 days supply | Qty: 100 | Fill #0

## 2018-08-06 MED FILL — GLIMEPIRIDE 2 MG TABS: 2 | 30 days supply | Qty: 30 | Fill #0

## 2018-08-06 MED FILL — !TRUE METRIX BLOOD GLUCOSE: 1 days supply | Qty: 1 | Fill #0

## 2018-08-06 MED FILL — TRUE METRIX TEST STRIP: 30 days supply | Qty: 100 | Fill #0
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
## 2018-08-07 LAB — COMPREHENSIVE METABOLIC PANEL WITH GFR
ALT: 37 IU/L (ref 0–44)
AST: 33 IU/L (ref 0–40)
Albumin/Globulin Ratio: 1.7 (ref 1.2–2.2)
Albumin: 4.4 g/dL (ref 3.5–5.5)
Alkaline Phosphatase: 119 IU/L — ABNORMAL HIGH (ref 39–117)
BUN/Creatinine Ratio: 15 (ref 9–20)
BUN: 13 mg/dL (ref 6–24)
Bilirubin Total: 0.4 mg/dL (ref 0.0–1.2)
CO2: 21 mmol/L (ref 20–29)
Calcium: 9.4 mg/dL (ref 8.7–10.2)
Chloride: 97 mmol/L (ref 96–106)
Creatinine, Ser: 0.85 mg/dL (ref 0.76–1.27)
GFR calc Af Amer: 117 mL/min/1.73
GFR calc non Af Amer: 101 mL/min/1.73
Globulin, Total: 2.6 g/dL (ref 1.5–4.5)
Glucose: 372 mg/dL — ABNORMAL HIGH (ref 65–99)
Potassium: 4.9 mmol/L (ref 3.5–5.2)
Sodium: 136 mmol/L (ref 134–144)
Total Protein: 7 g/dL (ref 6.0–8.5)

## 2018-08-07 LAB — LIPID PANEL
Chol/HDL Ratio: 3.9 ratio (ref 0.0–5.0)
Cholesterol, Total: 135 mg/dL (ref 100–199)
HDL: 35 mg/dL — ABNORMAL LOW
LDL Calculated: 74 mg/dL (ref 0–99)
Triglycerides: 129 mg/dL (ref 0–149)
VLDL Cholesterol Cal: 26 mg/dL (ref 5–40)

## 2018-08-07 LAB — MICROALBUMIN / CREATININE URINE RATIO
Creatinine, Urine: 237.8 mg/dL
Microalb/Creat Ratio: 11.6 mg/g{creat} (ref 0.0–30.0)
Microalbumin, Urine: 27.5 ug/mL

## 2018-08-08 ENCOUNTER — Encounter (INDEPENDENT_AMBULATORY_CARE_PROVIDER_SITE_OTHER): Payer: Self-pay | Admitting: Physical Medicine and Rehabilitation

## 2018-08-08 ENCOUNTER — Ambulatory Visit (INDEPENDENT_AMBULATORY_CARE_PROVIDER_SITE_OTHER): Payer: Self-pay

## 2018-08-08 ENCOUNTER — Ambulatory Visit (INDEPENDENT_AMBULATORY_CARE_PROVIDER_SITE_OTHER): Payer: Self-pay | Admitting: Physical Medicine and Rehabilitation

## 2018-08-08 ENCOUNTER — Ambulatory Visit: Payer: Self-pay

## 2018-08-08 VITALS — BP 145/96 | HR 86 | Temp 98.6°F

## 2018-08-08 DIAGNOSIS — M5416 Radiculopathy, lumbar region: Secondary | ICD-10-CM

## 2018-08-08 DIAGNOSIS — M48061 Spinal stenosis, lumbar region without neurogenic claudication: Secondary | ICD-10-CM

## 2018-08-08 MED ORDER — BETAMETHASONE SOD PHOS & ACET 6 (3-3) MG/ML IJ SUSP
6.0000 mg | Freq: Once | INTRAMUSCULAR | Status: AC
  Administered 2018-08-08: 6 mg

## 2018-08-08 MED ORDER — BETAMETHASONE SOD PHOS & ACET 6 (3-3) MG/ML IJ SUSP: 12.0000 mg | Freq: Once | INTRAMUSCULAR | Status: DC

## 2018-08-08 NOTE — Patient Instructions (Signed)

## 2018-08-08 NOTE — Procedures (Signed)
Lumbosacral Transforaminal Epidural Steroid Injection - Sub-Pedicular Approach with Fluoroscopic Guidance  Patient: Gerald Olson      Date of Birth: 1966-01-04 MRN: [id number] PCP: Antony Blackbird, MD      Visit Date: 08/08/2018   Universal Protocol:    Date/Time: 08/08/2018  Consent Given By: the patient  Position: PRONE  Additional Comments: Vital signs were monitored before and after the procedure. Patient was prepped and draped in the usual sterile fashion. The correct patient, procedure, and site was verified.   Injection Procedure Details:  Procedure Site One Meds Administered:  Meds ordered this encounter  Medications  . DISCONTD: betamethasone acetate-betamethasone sodium phosphate (CELESTONE) injection 12 mg  . betamethasone acetate-betamethasone sodium phosphate (CELESTONE) injection 6 mg    Laterality: Right  Location/Site:  L5-S1  Needle size: 22 G  Needle type: Spinal  Needle Placement: Transforaminal  Findings:    -Comments: Excellent flow of contrast along the nerve and into the epidural space.  Procedure Details: After squaring off the end-plates to get a true AP view, the C-arm was positioned so that an oblique view of the foramen as noted above was visualized. The target area is just inferior to the "nose of the scotty dog" or sub pedicular. The soft tissues overlying this structure were infiltrated with 2-3 ml. of 1% Lidocaine without Epinephrine.  The spinal needle was inserted toward the target using a "trajectory" view along the fluoroscope beam.  Under AP and lateral visualization, the needle was advanced so it did not puncture dura and was located close the 6 O'Clock position of the pedical in AP tracterory. Biplanar projections were used to confirm position. Aspiration was confirmed to be negative for CSF and/or blood. A 1-2 ml. volume of Isovue-250 was injected and flow of contrast was noted at each level. Radiographs were obtained for  documentation purposes.   After attaining the desired flow of contrast documented above, a 0.5 to 1.0 ml test dose of 0.25% Marcaine was injected into each respective transforaminal space.  The patient was observed for 90 seconds post injection.  After no sensory deficits were reported, and normal lower extremity motor function was noted,   the above injectate was administered so that equal amounts of the injectate were placed at each foramen (level) into the transforaminal epidural space.   Additional Comments:  The patient tolerated the procedure well Dressing: Band-Aid    Post-procedure details: Patient was observed during the procedure. Post-procedure instructions were reviewed.  Patient left the clinic in stable condition.

## 2018-08-08 NOTE — Progress Notes (Signed)
 .  Numeric Pain Rating Scale and Functional Assessment Average Pain 9   In the last MONTH (on 0-10 scale) has pain interfered with the following?  1. General activity like being  able to carry out your everyday physical activities such as walking, climbing stairs, carrying groceries, or moving a chair?  Rating(9)   +Driver, -BT, -Dye Allergies.  

## 2018-08-08 NOTE — Progress Notes (Signed)
Gerald Olson - 52 y.o. male MRN [id number]  Date of birth: [birth year]  Office Visit Note: Visit Date: 08/08/2018 PCP: Antony Blackbird, MD Referred by: Antony Blackbird, MD  Subjective: Chief Complaint  Patient presents with  . Lower Back - Pain  . Right Leg - Pain   HPI: Gerald Olson is a 52 y.o. male who comes in today At the request of Dr. Rodell Perna for right L5 transforaminal epidural steroid injection.  MRI findings of listhesis and facet arthropathy at L5-S1 with degenerative disc height loss with right foraminal narrowing but also some lateral recess narrowing.  Symptoms are more right L5 down the right posterior lateral leg and anterior lateral calf.  Interestingly when he saw Benjiman Core, PA-C in the office he was having more left-sided complaints per his notes.  I have also reviewed the patient's most recent note from his family practitioner with history of uncontrolled diabetes.  Hemoglobin A1c is over 9.  He reports to me that he tries to maintain this with supplements and natural treatments.  We discussed the fact that epidural steroid injection could cause his blood sugar to increase temporarily.  We likely would use half dose which is still adequate for nerve root treatment.  He reports that he has had back pain for 25 years and that he had some worsening with obtaining his master's degree where he had to do a lot of sitting.  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  He has history of alcohol use and all this is documented in his primary care note.  I think a diagnostic and hopefully therapeutic L5 transforaminal injection will help try to figure out how much relief he gets from this area.  He will return to see Dr. Inda Merlin depending on relief.  ROS Otherwise per HPI.  Assessment & Plan: Visit Diagnoses:  1. Lumbar radiculopathy   2. Foraminal stenosis of lumbar region     Plan: No additional findings.   Meds & Orders:  Meds ordered this encounter  Medications  . DISCONTD:  betamethasone acetate-betamethasone sodium phosphate (CELESTONE) injection 12 mg  . betamethasone acetate-betamethasone sodium phosphate (CELESTONE) injection 6 mg    Orders Placed This Encounter  Procedures  . XR C-ARM NO REPORT  . Epidural Steroid injection    Follow-up: Return for Rodell Perna, MD.   Procedures: No procedures performed  Lumbosacral Transforaminal Epidural Steroid Injection - Sub-Pedicular Approach with Fluoroscopic Guidance  Patient: Gerald Olson      Date of Birth: [birth year] MRN: [id number] PCP: Antony Blackbird, MD      Visit Date: 08/08/2018   Universal Protocol:    Date/Time: 08/08/2018  Consent Given By: the patient  Position: PRONE  Additional Comments: Vital signs were monitored before and after the procedure. Patient was prepped and draped in the usual sterile fashion. The correct patient, procedure, and site was verified.   Injection Procedure Details:  Procedure Site One Meds Administered:  Meds ordered this encounter  Medications  . DISCONTD: betamethasone acetate-betamethasone sodium phosphate (CELESTONE) injection 12 mg  . betamethasone acetate-betamethasone sodium phosphate (CELESTONE) injection 6 mg    Laterality: Right  Location/Site:  L5-S1  Needle size: 22 G  Needle type: Spinal  Needle Placement: Transforaminal  Findings:    -Comments: Excellent flow of contrast along the nerve and into the epidural space.  Procedure Details: After squaring off the end-plates to get a true AP view, the C-arm was positioned so that an oblique view of the  foramen as noted above was visualized. The target area is just inferior to the "nose of the scotty dog" or sub pedicular. The soft tissues overlying this structure were infiltrated with 2-3 ml. of 1% Lidocaine without Epinephrine.  The spinal needle was inserted toward the target using a "trajectory" view along the fluoroscope beam.  Under AP and lateral visualization, the needle was advanced  so it did not puncture dura and was located close the 6 O'Clock position of the pedical in AP tracterory. Biplanar projections were used to confirm position. Aspiration was confirmed to be negative for CSF and/or blood. A 1-2 ml. volume of Isovue-250 was injected and flow of contrast was noted at each level. Radiographs were obtained for documentation purposes.   After attaining the desired flow of contrast documented above, a 0.5 to 1.0 ml test dose of 0.25% Marcaine was injected into each respective transforaminal space.  The patient was observed for 90 seconds post injection.  After no sensory deficits were reported, and normal lower extremity motor function was noted,   the above injectate was administered so that equal amounts of the injectate were placed at each foramen (level) into the transforaminal epidural space.   Additional Comments:  The patient tolerated the procedure well Dressing: Band-Aid    Post-procedure details: Patient was observed during the procedure. Post-procedure instructions were reviewed.  Patient left the clinic in stable condition.    Clinical History: MRI LUMBAR SPINE WITHOUT CONTRAST  TECHNIQUE: Multiplanar, multisequence MR imaging of the lumbar spine was performed. No intravenous contrast was administered.  COMPARISON:  Radiography 07/03/2018  FINDINGS: Segmentation:  5 lumbar type vertebral bodies  Alignment:  Grade 1 anterolisthesis at L5-S1  Vertebrae:  No fracture, evidence of discitis, or bone lesion.  Conus medullaris and cauda equina: Conus extends to the T12-L1 level. Conus and cauda equina appear normal.  Paraspinal and other soft tissues: Negative  Disc levels:  T12- L1: Spondylosis and disc narrowing without impingement  L1-L2: Spondylosis and disc narrowing with small left paracentral protrusion.  L2-L3: Unremarkable.  L3-L4: Disc narrowing and bulging.  No impingement  L4-L5: Degenerative facet spurring. The  disc is bulging with annular fissure at the left foramen.  L5-S1:Severe facet degeneration with bulky hypertrophy on the right. There is anterolisthesis. Greatest level of degenerative disc narrowing with biforaminal height loss and bulge causing right foraminal impingement with L5 flattening. There is bilateral subarticular recess stenosis and S1 impingement.  IMPRESSION: 1. L5-S1 advanced facet arthropathy with anterolisthesis and right L5 and bilateral S1 impingement. 2. Noncompressive degenerative changes at the other levels as described above.   Electronically Signed   By: Monte Fantasia M.D.   On: 07/21/2018 09:06   He reports that he has quit smoking. He has never used smokeless tobacco.  Recent Labs    04/25/18 1415 06/02/18 1738  HGBA1C 9.2* 9.2*    Objective:  VS:  HT:    WT:   BMI:     BP:(!) 145/96  HR:86bpm  TEMP:98.6 F (37 C)(Oral)  RESP:  Physical Exam Musculoskeletal:     Comments: Patient ambulates with can.  Patient has no pain over the greater trochanters and no pain with hip rotation.  There is good distal strength symmetrically.  There is no clonus bilaterally.  Neurological:     General: No focal deficit present.     Mental Status: He is alert and oriented to person, place, and time.     Motor: No abnormal muscle tone.  Coordination: Coordination normal.     Ortho Exam Imaging: Xr C-arm No Report  Result Date: 08/08/2018 Please see Notes tab for imaging impression.   Past Medical/Family/Surgical/Social History: Medications & Allergies reviewed per EMR, new medications updated. Patient Active Problem List   Diagnosis Date Noted  . Alcoholic cirrhosis of liver ([place]) 06/02/2018  . Uncontrolled type 2 diabetes mellitus with hyperglycemia ([place]) 04/25/2018  . Alcohol use disorder, mild, abuse 04/25/2018   Past Medical History:  Diagnosis Date  . Calculus of gallbladder without cholecystitis without obstruction   . Chronic pain     back  . Disc degeneration   . Disc degeneration, lumbar   . Headache    Family History  Problem Relation Age of Onset  . Aneurysm Father    Past Surgical History:  Procedure Laterality Date  . CHOLECYSTECTOMY N/A 06/02/2018   Procedure: LAPAROSCOPIC CHOLECYSTECTOMY;  Surgeon: Vickie Epley, MD;  Location: [facility] ORS;  Service: General;  Laterality: N/A;  . KNEE ARTHROSCOPY     30 Years ago   Social History   Occupational History  . Not on file  Tobacco Use  . Smoking status: Former Research scientist (life sciences)  . Smokeless tobacco: Never Used  Substance and Sexual Activity  . Alcohol use: Yes    Comment: cut back  . Drug use: No  . Sexual activity: Yes

## 2018-08-25 ENCOUNTER — Telehealth: Payer: Self-pay | Admitting: *Deleted

## 2018-08-25 NOTE — Telephone Encounter (Signed)
Medical Assistant left message on patient's home and cell voicemail. Voicemail states to give a call back to Kourtney Montesinos with [facility] at [phone].  

## 2018-08-25 NOTE — Telephone Encounter (Signed)
-----   Message from Antony Blackbird, MD sent at 08/22/2018  5:51 PM EST ----- Please let patient know that his blood sugar was very elevated at his last visit at 372 and that he should consider the use of insulin to help improve his blood sugar control. His lipid panel showed that his LDL/bad cholesterol was 74- near the goal of 70 or less but his HDL/good cholesterol level was low at 35 with goal of 40 or higher. Increase food rich in omega 3 fatty acids or consider an otc omega 3 fatty acid supplement

## 2018-09-03 ENCOUNTER — Telehealth: Payer: Self-pay

## 2018-09-03 NOTE — Telephone Encounter (Signed)
As per Abelino Derrick, [facility], the patient's referral remains active with them

## 2018-09-11 MED FILL — GLIMEPIRIDE 2 MG TABS: 2 | 30 days supply | Qty: 30 | Fill #1

## 2018-09-11 MED FILL — ?CELECOXIB 200MG CAP: 200 | 30 days supply | Qty: 60 | Fill #1

## 2018-09-23 ENCOUNTER — Ambulatory Visit (INDEPENDENT_AMBULATORY_CARE_PROVIDER_SITE_OTHER): Payer: Self-pay | Admitting: Orthopaedic Surgery

## 2018-09-23 ENCOUNTER — Encounter (INDEPENDENT_AMBULATORY_CARE_PROVIDER_SITE_OTHER): Payer: Self-pay | Admitting: Orthopaedic Surgery

## 2018-09-23 VITALS — BP 168/103 | HR 104 | Ht 73.5 in | Wt 228.0 lb

## 2018-09-23 DIAGNOSIS — E119 Type 2 diabetes mellitus without complications: Secondary | ICD-10-CM

## 2018-09-23 DIAGNOSIS — M47816 Spondylosis without myelopathy or radiculopathy, lumbar region: Secondary | ICD-10-CM

## 2018-09-23 NOTE — Progress Notes (Signed)
Office Visit Note   Patient: Gerald Olson           Date of Birth: [birth year]           MRN: [id number] Visit Date: 09/23/2018              Requested by: Antony Blackbird, MD [place], [place] [address] PCP: Antony Blackbird, MD   Assessment & Plan: Visit Diagnoses:  1. Facet arthritis, degenerative, lumbar spine     Plan: Patient diabetes remains poorly controlled.  He may have to get on insulin he is waiting on [employer].  He can call us once is been obtained and we can set up a physical therapy referral.  We discussed exercise activities he should avoid.  We can recheck him in 2 months.  Follow-Up Instructions: Return in about 2 months (around 11/22/2018).   Orders:  No orders of the defined types were placed in this encounter.  No orders of the defined types were placed in this encounter.     Procedures: No procedures performed   Clinical Data: No additional findings.   Subjective: Chief Complaint  Patient presents with  . Lower Back - Follow-up    HPI 53 year old male returns with ongoing problems with back pain.  Has pain at the lumbosacral junction he describes at times when he is his weight on his left foot is making internal rotation he feels sudden electrical pain and actually is fallen from this .  1 time he fell backwards and back broke some shelves.  He states the epidural gave him relief for only about 6 hours.  He has had problems with hyperglycemia elevated A1c last value 9.2 on 06/02/2018.  He is on oral medication not on insulin.  He has stiffness in his back difficulty getting upright at times it shoots into his leg no associated bowel or bladder symptoms.  He has had problems with right eye problems with retinal problems.  Recent gallbladder removal.  Currently is on Celebrex 200 mg twice daily.  Patient is ambulatory with a cane.  He does stretching in the morning to help his back.  He denies claudication symptoms.  Positive history of  alcohol use and cirrhosis.  Review of Systems 14 point update reviewed and unchanged from 07/22/2018 office visit with me other than as mentioned in HPI.   Objective: Vital Signs: BP (!) 168/103   Pulse (!) 104   Ht 6' 1.5" (1.867 m)   Wt 228 lb (103.4 kg)   BMI 29.67 kg/m   Physical Exam Constitutional:      Appearance: He is well-developed.  HENT:     Head: Normocephalic and atraumatic.  Eyes:     Pupils: Pupils are equal, round, and reactive to light.  Neck:     Thyroid: No thyromegaly.     Trachea: No tracheal deviation.  Cardiovascular:     Rate and Rhythm: Normal rate.  Pulmonary:     Effort: Pulmonary effort is normal.     Breath sounds: No wheezing.  Abdominal:     General: Bowel sounds are normal.     Palpations: Abdomen is soft.  Skin:    General: Skin is warm and dry.     Capillary Refill: Capillary refill takes less than 2 seconds.  Neurological:     Mental Status: He is alert and oriented to person, place, and time.  Psychiatric:        Behavior: Behavior normal.  Thought Content: Thought content normal.        Judgment: Judgment normal.     Ortho Exam patient has good upper extremity biceps triceps development.  No atrophy.  No rash over exposed skin.  He is from sitting standing normal heel toe gait with his cane.  Good hip range of motion.  Specialty Comments:  No specialty comments available.  Imaging: No results found.   PMFS History: Patient Active Problem List   Diagnosis Date Noted  . Alcoholic cirrhosis of liver ([place]) 06/02/2018  . Uncontrolled type 2 diabetes mellitus with hyperglycemia ([place]) 04/25/2018  . Alcohol use disorder, mild, abuse 04/25/2018   Past Medical History:  Diagnosis Date  . Calculus of gallbladder without cholecystitis without obstruction   . Chronic pain    back  . Disc degeneration   . Disc degeneration, lumbar   . Headache     Family History  Problem Relation Age of Onset  . Aneurysm Father       Past Surgical History:  Procedure Laterality Date  . CHOLECYSTECTOMY N/A 06/02/2018   Procedure: LAPAROSCOPIC CHOLECYSTECTOMY;  Surgeon: Vickie Epley, MD;  Location: [facility] ORS;  Service: General;  Laterality: N/A;  . KNEE ARTHROSCOPY     30 Years ago   Social History   Occupational History  . Not on file  Tobacco Use  . Smoking status: Former Research scientist (life sciences)  . Smokeless tobacco: Never Used  Substance and Sexual Activity  . Alcohol use: Yes    Comment: cut back  . Drug use: No  . Sexual activity: Yes

## 2018-10-06 ENCOUNTER — Ambulatory Visit: Payer: Self-pay | Attending: Family Medicine | Admitting: Family Medicine

## 2018-10-06 ENCOUNTER — Encounter: Payer: Self-pay | Admitting: Family Medicine

## 2018-10-06 VITALS — BP 139/89 | HR 92 | Temp 98.9°F | Resp 18 | Ht 74.0 in | Wt 242.0 lb

## 2018-10-06 DIAGNOSIS — Z79899 Other long term (current) drug therapy: Secondary | ICD-10-CM

## 2018-10-06 DIAGNOSIS — R748 Abnormal levels of other serum enzymes: Secondary | ICD-10-CM

## 2018-10-06 DIAGNOSIS — G629 Polyneuropathy, unspecified: Secondary | ICD-10-CM

## 2018-10-06 DIAGNOSIS — H547 Unspecified visual loss: Secondary | ICD-10-CM

## 2018-10-06 DIAGNOSIS — F1010 Alcohol abuse, uncomplicated: Secondary | ICD-10-CM

## 2018-10-06 DIAGNOSIS — G8929 Other chronic pain: Secondary | ICD-10-CM

## 2018-10-06 DIAGNOSIS — M5442 Lumbago with sciatica, left side: Secondary | ICD-10-CM

## 2018-10-06 DIAGNOSIS — R3121 Asymptomatic microscopic hematuria: Secondary | ICD-10-CM

## 2018-10-06 DIAGNOSIS — E1165 Type 2 diabetes mellitus with hyperglycemia: Secondary | ICD-10-CM

## 2018-10-06 DIAGNOSIS — M5441 Lumbago with sciatica, right side: Secondary | ICD-10-CM

## 2018-10-06 LAB — POCT GLYCOSYLATED HEMOGLOBIN (HGB A1C): Hemoglobin A1C: 8.2 % — AB (ref 4.0–5.6)

## 2018-10-06 LAB — POCT URINALYSIS DIP (CLINITEK)
Bilirubin, UA: NEGATIVE
Glucose, UA: NEGATIVE mg/dL
Ketones, POC UA: NEGATIVE mg/dL
Leukocytes, UA: NEGATIVE
Nitrite, UA: NEGATIVE
Spec Grav, UA: 1.015
Urobilinogen, UA: 0.2 U/dL
pH, UA: 7

## 2018-10-06 LAB — GLUCOSE, POCT (MANUAL RESULT ENTRY): POC Glucose: 280 mg/dL — AB (ref 70–99)

## 2018-10-06 MED ORDER — METFORMIN HCL 500 MG PO TABS: ORAL_TABLET | ORAL | 3 refills | Status: DC

## 2018-10-06 MED ORDER — GLIMEPIRIDE 2 MG PO TABS: 2.0000 mg | ORAL_TABLET | Freq: Every day | ORAL | 1 refills | Status: DC

## 2018-10-06 NOTE — Progress Notes (Addendum)
Subjective:    Patient ID: Gerald Olson, male    DOB: 1966/08/09, 53 y.o.   MRN: [id number]  HPI       53 yo male seen in follow-up of chronic medical issues including type 2 diabetes.  Patient's hemoglobin A1c on 06/02/2018 was 9.2 and patient's hemoglobin A1c at today's visit is 8.2.  Patient with a random blood sugar today's visit of 280.  Patient with urinalysis today showing small amount of blood.      Patient reports continued issues with back pain with radiation.  Pain is always at least a 6 to an 8.  Patient has had an epidural injection but this only minimally decreased his pain but raised his blood sugar.  Patient is using a supplement to help with his blood sugars and sometimes takes his Amaryl as well.  Patient has had some issues with urinary frequency as well as nighttime frequency of urination.  Patient denies any dysuria.  Patient denies increased thirst.  Patient does report worsening issues with his vision.  Patient states that there is an area in his right eye field of vision that appears wavy and he feels that this area is increasing in size.  Patient is now willing to be referred to be seen by an eye specialist.      He is currently using Celebrex on an as-needed basis to help with back pain.  Patient states that he tries to use a Celebrex sparingly due to concerns about cardiac risk factors with the use of the medication.  Patient also continues to drink beer on a daily basis.  Patient reports that he drinks about 1 beer before dinner to help with his appetite and then has 2-3 beers at bedtime to help with sleep.  Patient reports difficulty sleeping secondary to painful neuropathy in his feet as well as back pain.  Patient also still waiting to hear about disability.  Past Medical History:  Diagnosis Date  . Calculus of gallbladder without cholecystitis without obstruction   . Chronic pain    back  . Disc degeneration   . Disc degeneration, lumbar   . Headache    Past  Surgical History:  Procedure Laterality Date  . CHOLECYSTECTOMY N/A 06/02/2018   Procedure: LAPAROSCOPIC CHOLECYSTECTOMY;  Surgeon: Vickie Epley, MD;  Location: [facility] ORS;  Service: General;  Laterality: N/A;  . KNEE ARTHROSCOPY     30 Years ago   Social History   Tobacco Use  . Smoking status: Former Research scientist (life sciences)  . Smokeless tobacco: Never Used  Substance Use Topics  . Alcohol use: Yes    Comment: cut back  . Drug use: No   Allergies  Allergen Reactions  . Hydrocodone Itching    Itching, face tingling, dizziness.  [name] [Oxycodone Hcl] Itching    Current Outpatient Medications on File Prior to Visit  Medication Sig Dispense Refill  . Ashwagandha 500 MG CAPS Take 1 capsule by mouth 3 (three) times daily.    Marland Kitchen BLACK CURRANT SEED OIL PO Take by mouth.    . celecoxib (CELEBREX) 200 MG capsule Take 1 capsule (200 mg total) by mouth 2 (two) times daily. As needed for pain; take after eating 60 capsule 6  . Cinnamon 500 MG capsule Take 1,000 mg by mouth 2 (two) times daily.    . diclofenac sodium (VOLTAREN) 1 % GEL Apply 1 application topically 3 (three) times daily as needed (pain).    Marland Kitchen glimepiride (AMARYL) 2 MG tablet Take  1 tablet (2 mg total) by mouth daily before breakfast. 30 tablet 3  . glucose blood (TRUE METRIX BLOOD GLUCOSE TEST) test strip Use as instructed 100 each 12  . ibuprofen (ADVIL,MOTRIN) 200 MG tablet Take 600 mg by mouth every 8 (eight) hours as needed (pain).    . Multiple Vitamins-Minerals (MULTIVITAMIN WITH MINERALS) tablet Take 1 tablet by mouth daily.    . naproxen sodium (ALEVE) 220 MG tablet Take 660 mg by mouth daily as needed (pain).    . Nutritional Supplements (DHEA PO) Take 100 mg by mouth daily.    . ondansetron (ZOFRAN) 4 MG tablet Take 1 tablet (4 mg total) by mouth every 8 (eight) hours as needed for nausea or vomiting. 20 tablet 0  . OVER THE COUNTER MEDICATION Take 1 drop by mouth 2 (two) times daily. CBD Oil    . TRUEPLUS LANCETS 28G MISC  Use as directed 100 each 1  . methocarbamol (ROBAXIN) 500 MG tablet Take 1 tablet (500 mg total) by mouth 2 (two) times daily. 20 tablet 0   No current facility-administered medications on file prior to visit.       Review of Systems  Constitutional: Positive for fatigue. Negative for chills and fever.  HENT: Negative for sore throat and trouble swallowing.   Respiratory: Negative for cough and shortness of breath.   Cardiovascular: Negative for chest pain and palpitations.  Gastrointestinal: Negative for abdominal pain, constipation, diarrhea and nausea.  Endocrine: Positive for polyuria. Negative for polydipsia and polyphagia.  Genitourinary: Positive for frequency (3 or more times per night). Negative for dysuria.  Musculoskeletal: Positive for arthralgias, back pain and gait problem.  Neurological: Positive for numbness. Negative for dizziness and headaches.       Objective:   Physical Exam BP 139/89 (BP Location: Right Arm, Patient Position: Sitting, Cuff Size: Normal)   Pulse 92   Temp 98.9 F (37.2 C) (Oral)   Resp 18   Ht 6\' 2"  (1.88 m)   Wt 242 lb (109.8 kg)   SpO2 97%   BMI 31.07 kg/m  Nurse's notes and vital signs reviewed General- well-nourished, well-developed overweight for height male who was initially lying down on his left side on the exam table as he reports that this helps to somewhat relieve his low back pain with radiation down the right leg Neck-supple, no lymphadenopathy Lungs-clear to auscultation bilaterally Cardiovascular-regular rate and rhythm Lungs-clear to auscultation bilaterally Cardiovascular-regular rate and rhythm Abdomen-soft, nontender Back- patient with lumbosacral tenderness to palpation, lumbar paraspinous spasm and right SI joint tenderness with positive seated right leg raise Extremities-no edema Psych- patient with an odd affect and very talkative and difficult to end conversations with the patient/leave the exam room       Assessment & Plan:  1. Uncontrolled type 2 diabetes mellitus with hyperglycemia ([place]) Patient has had slight improvement in control of diabetes his A1c has decreased from 9.2 in October and is now at 8.2 patient however has had steroid injection under at this time.  Which may have temporarily increased his blood sugars leading to slightly higher A1c.  Patient has been asked to start Glucophage initially 1 pill after his evening meal then increase to 2 pills in the next 1 to 2 weeks.  Patient states that he did use Glucophage in the past and stopped as he had some mild abdominal discomfort/GI issues but these have actually resolved by the time he stopped the use of Glucophage.  Patient will also continue use of  Amaryl.  Patient will continue monitoring his blood sugars.  Patient had urinalysis at today's visit as well as CMP.  Patient reports visual field disturbances and patient will be referred to ophthalmology for further evaluation/diabetic eye exam to see if retinopathy may be playing a role in his visual disturbance.  Diabetic foot care also discussed the patient appears to be taking good care of his feet and nails. - HgB A1c - Glucose (CBG) - POCT URINALYSIS DIP (CLINITEK) - Ambulatory referral to Ophthalmology - metFORMIN (GLUCOPHAGE) 500 MG tablet; Two pills after the evening meal  Dispense: 180 tablet; Refill: 3 - glimepiride (AMARYL) 2 MG tablet; Take 1 tablet (2 mg total) by mouth daily before breakfast.  Dispense: 90 tablet; Refill: 1 - Comprehensive metabolic panel  2. Chronic bilateral low back pain with bilateral sciatica During visit, patient reports that he takes Celebrex as needed to help with back pain.  Patient states that he does not take Celebrex daily secondary to increased cardiovascular risk.  Patient states that he has lots of Celebrex at home and does not need a refill.  After the visit however patient apparently told CMA that he needed refill of tramadol which he did not  discussed during his visit.  Prescription will be sent to pharmacy for 30-day supply of tramadol to take as needed for more severe pain not controlled with Celebrex.  3. Visual impairment Patient with complaint of visual impairment, mostly in the right eye and patient will be referred to ophthalmology for further evaluation and treatment as well as diabetic eye exam - Ambulatory referral to Ophthalmology  4. Elevated liver enzymes Patient with elevated liver enzymes which will be rechecked as part of CMP.  Patient does have history of alcohol use disorder and admits that he still drinks at least 2-3 beers daily which is a decrease from past alcohol consumption.  Discussed attempts at further decreasing his use of alcohol with eventual goal of quitting - Comprehensive metabolic panel  5. Peripheral polyneuropathy Patient reports issues with peripheral neuropathic pain/lumbar radiculopathy for which he is currently only taking Celebrex and using diclofenac gel as needed.  Patient reports that he does not like to take a lot of medications  6. Alcohol use disorder, mild, abuse Patient reports continued use of alcohol/beer and reports drinking about 3 beers nightly  7. Encounter for long-term (current) use of medications Patient will have CMP in follow-up of long-term use of medications for treatment of diabetes and chronic pain. - Comprehensive metabolic panel  8. Asymptomatic microscopic hematuria Patient with hematuria on urinalysis as well as complaint of urinary frequency/nocturia.  Patient is being referred to urology for further evaluation and trea will - Ambulatory referral to Urology  An After Visit Summary was printed and given to the patient.  Return in about 4 months (around 02/04/2019) for DM.

## 2018-10-07 LAB — COMPREHENSIVE METABOLIC PANEL WITH GFR
ALT: 40 IU/L (ref 0–44)
AST: 41 IU/L — ABNORMAL HIGH (ref 0–40)
Albumin/Globulin Ratio: 1.5 (ref 1.2–2.2)
Albumin: 4 g/dL (ref 3.8–4.9)
Alkaline Phosphatase: 105 IU/L (ref 39–117)
BUN/Creatinine Ratio: 17 (ref 9–20)
BUN: 14 mg/dL (ref 6–24)
Bilirubin Total: 0.4 mg/dL (ref 0.0–1.2)
CO2: 25 mmol/L (ref 20–29)
Calcium: 8.5 mg/dL — ABNORMAL LOW (ref 8.7–10.2)
Chloride: 101 mmol/L (ref 96–106)
Creatinine, Ser: 0.84 mg/dL (ref 0.76–1.27)
GFR calc Af Amer: 116 mL/min/1.73
GFR calc non Af Amer: 101 mL/min/1.73
Globulin, Total: 2.6 g/dL (ref 1.5–4.5)
Glucose: 272 mg/dL — ABNORMAL HIGH (ref 65–99)
Potassium: 5 mmol/L (ref 3.5–5.2)
Sodium: 136 mmol/L (ref 134–144)
Total Protein: 6.6 g/dL (ref 6.0–8.5)

## 2018-10-07 MED ORDER — TRAMADOL HCL 50 MG PO TABS: 50.0000 mg | ORAL_TABLET | Freq: Two times a day (BID) | ORAL | 0 refills | Status: DC | PRN

## 2018-10-07 MED FILL — GLIMEPIRIDE 2 MG TABS: 2 | 30 days supply | Qty: 30 | Fill #0

## 2018-10-07 MED FILL — traMADol HCL 50 MG TABS: 50 | 30 days supply | Qty: 60 | Fill #0

## 2018-10-07 MED FILL — ?METFORMIN HCL 500MG TABL: 500 | 30 days supply | Qty: 60 | Fill #0

## 2018-10-10 ENCOUNTER — Telehealth: Payer: Self-pay | Admitting: *Deleted

## 2018-10-10 NOTE — Telephone Encounter (Signed)
-----   Message from Antony Blackbird, MD sent at 10/09/2018 12:52 PM EST ----- Please notify patient that his glucose was elevated at 272 on recent blood work and AST was 41 (normal 0-40)

## 2018-10-10 NOTE — Telephone Encounter (Signed)
Medical Assistant left message on patient's home and cell voicemail. Voicemail states to give a call back to Naren Benally with [facility] at [phone].  

## 2018-10-31 ENCOUNTER — Ambulatory Visit (INDEPENDENT_AMBULATORY_CARE_PROVIDER_SITE_OTHER): Payer: Self-pay | Admitting: Urology

## 2018-10-31 ENCOUNTER — Encounter: Payer: Self-pay | Admitting: Urology

## 2018-10-31 VITALS — BP 153/89 | HR 98 | Ht 74.0 in | Wt 239.8 lb

## 2018-10-31 DIAGNOSIS — Z125 Encounter for screening for malignant neoplasm of prostate: Secondary | ICD-10-CM

## 2018-10-31 DIAGNOSIS — R351 Nocturia: Secondary | ICD-10-CM

## 2018-10-31 DIAGNOSIS — R8290 Unspecified abnormal findings in urine: Secondary | ICD-10-CM

## 2018-10-31 LAB — URINALYSIS, COMPLETE
BILIRUBIN UA: NEGATIVE
Glucose, UA: NEGATIVE
Leukocytes, UA: NEGATIVE
Nitrite, UA: NEGATIVE
SPEC GRAV UA: 1.025 (ref 1.005–1.030)
UUROB: 0.2 mg/dL (ref 0.2–1.0)
pH, UA: 6 (ref 5.0–7.5)

## 2018-10-31 LAB — BLADDER SCAN AMB NON-IMAGING: Scan Result: 3

## 2018-10-31 NOTE — Progress Notes (Signed)
10/31/2018 1:48 PM   Gerald Olson 10/24/1965 [id number]  Referring provider: Antony Blackbird, MD [place], [place] [address]  No chief complaint on file.   HPI:  Gerald Olson is a 53 year old he has noc x 3. He had blood on a dip. UA today clear. No gross hematuria. He had gallbladder surgery and changes in his urine. Different color and smell of the urine. He typically voids with a good flow. He takes apple cider vinegar. He snores. No Gu hx or surgery.   PVR 3 cc. He is claiming disability because of DDD.   Modifying factors: There are no other modifying factors  Associated signs and symptoms: There are no other associated signs and symptoms Aggravating and relieving factors: There are no other aggravating or relieving factors Severity: Moderate Duration: Persistent   PMH: Past Medical History:  Diagnosis Date  . Calculus of gallbladder without cholecystitis without obstruction   . Chronic pain    back  . Disc degeneration   . Disc degeneration, lumbar   . Headache     Surgical History: Past Surgical History:  Procedure Laterality Date  . CHOLECYSTECTOMY N/A 06/02/2018   Procedure: LAPAROSCOPIC CHOLECYSTECTOMY;  Surgeon: Vickie Epley, MD;  Location: [facility] ORS;  Service: General;  Laterality: N/A;  . KNEE ARTHROSCOPY     30 Years ago    Home Medications:  Allergies as of 10/31/2018      Reactions   Hydrocodone Itching   Itching, face tingling, dizziness.   Oxycontin [oxycodone Hcl] Itching      Medication List       Accurate as of October 31, 2018  1:48 PM. Always use your most recent med list.        Ashwagandha 500 MG Caps Take 1 capsule by mouth 3 (three) times daily.   BLACK CURRANT SEED OIL PO Take by mouth.   celecoxib 200 MG capsule Commonly known as:  CeleBREX Take 1 capsule (200 mg total) by mouth 2 (two) times daily. As needed for pain; take after eating   Cinnamon 500 MG capsule Take 1,000 mg by mouth 2 (two) times daily.     DHEA PO Take 100 mg by mouth daily.   diclofenac sodium 1 % Gel Commonly known as:  VOLTAREN Apply 1 application topically 3 (three) times daily as needed (pain).   glimepiride 2 MG tablet Commonly known as:  AMARYL Take 1 tablet (2 mg total) by mouth daily before breakfast.   glucose blood test strip Commonly known as:  True Metrix Blood Glucose Test Use as instructed   ibuprofen 200 MG tablet Commonly known as:  ADVIL,MOTRIN Take 600 mg by mouth every 8 (eight) hours as needed (pain).   metFORMIN 500 MG tablet Commonly known as:  GLUCOPHAGE Two pills after the evening meal   methocarbamol 500 MG tablet Commonly known as:  ROBAXIN Take 1 tablet (500 mg total) by mouth 2 (two) times daily.   multivitamin with minerals tablet Take 1 tablet by mouth daily.   naproxen sodium 220 MG tablet Commonly known as:  ALEVE Take 660 mg by mouth daily as needed (pain).   ondansetron 4 MG tablet Commonly known as:  Zofran Take 1 tablet (4 mg total) by mouth every 8 (eight) hours as needed for nausea or vomiting.   OVER THE COUNTER MEDICATION Take 1 drop by mouth 2 (two) times daily. CBD Oil   traMADol 50 MG tablet Commonly known as:  ULTRAM Take 1 tablet (50  mg total) by mouth every 12 (twelve) hours as needed for severe pain.   TRUEplus Lancets 28G Misc Use as directed       Allergies:  Allergies  Allergen Reactions  . Hydrocodone Itching    Itching, face tingling, dizziness.  [name] [Oxycodone Hcl] Itching    Family History: Family History  Problem Relation Age of Onset  . Aneurysm Father     Social History:  reports that he has quit smoking. He has never used smokeless tobacco. He reports current alcohol use. He reports that he does not use drugs.  ROS:                                        Physical Exam: There were no vitals taken for this visit.  Constitutional:  Alert and oriented, No acute distress. HEENT: [place] AT, moist  mucus membranes.  Trachea midline, no masses. Cardiovascular: No clubbing, cyanosis, or edema. Respiratory: Normal respiratory effort, no increased work of breathing. GI: Abdomen is soft, nontender, nondistended, no abdominal masses GU: No CVA tenderness, scrotum small, testicle descended and palpably normal, penis circumcised, DRE: prostate smooth, no hard area or nodule , 30 g  Lymph: No cervical or inguinal lymphadenopathy. Skin: No rashes, bruises or suspicious lesions. Neurologic: Grossly intact, no focal deficits, moving all 4 extremities. Psychiatric: Normal mood and affect.  Laboratory Data: Lab Results  Component Value Date   WBC 7.3 06/02/2018   HGB 15.3 06/02/2018   HCT 44.9 06/02/2018   MCV 84.5 06/02/2018   PLT 252 06/02/2018    Lab Results  Component Value Date   CREATININE 0.84 10/06/2018    No results found for: PSA  No results found for: TESTOSTERONE  Lab Results  Component Value Date   HGBA1C 8.2 (A) 10/06/2018    Urinalysis    Component Value Date/Time   BILIRUBINUR negative 10/06/2018 1455   KETONESUR negative 10/06/2018 1455   UROBILINOGEN 0.2 10/06/2018 1455   NITRITE Negative 10/06/2018 1455   LEUKOCYTESUR Negative 10/06/2018 1455    Lab Results  Component Value Date   LABMICR 27.5 08/06/2018     No results found for this or any previous visit. No results found for this or any previous visit. No results found for this or any previous visit. No results found for this or any previous visit. No results found for this or any previous visit. No results found for this or any previous visit. No results found for this or any previous visit. No results found for this or any previous visit.  Assessment & Plan:    Nocturia, abnormal UA - normal exam. Check [place] and cystoscopy. UA is clear.   No follow-ups on file.  Festus Aloe, MD  [facility] [address] [place], [place] [address] [phone]

## 2018-10-31 NOTE — Patient Instructions (Signed)

## 2018-11-01 LAB — PSA: PROSTATE SPECIFIC AG, SERUM: 0.4 ng/mL (ref 0.0–4.0)

## 2018-11-10 ENCOUNTER — Other Ambulatory Visit: Payer: Self-pay

## 2018-11-10 ENCOUNTER — Ambulatory Visit: Payer: Self-pay | Attending: Family Medicine

## 2018-11-10 MED FILL — GLIMEPIRIDE 2 MG TABS: 2 | 30 days supply | Qty: 30 | Fill #1

## 2018-11-10 MED FILL — CELECOXIB 200 MG CAP: 200 | 30 days supply | Qty: 60 | Fill #2

## 2018-11-10 MED FILL — ?METFORMIN HCL 500MG TABL: 500 | 30 days supply | Qty: 60 | Fill #1

## 2018-11-10 MED FILL — TRUE METRIX TEST STRIP: 30 days supply | Qty: 100 | Fill #1

## 2018-11-13 ENCOUNTER — Other Ambulatory Visit: Payer: Self-pay

## 2018-11-13 ENCOUNTER — Ambulatory Visit: Payer: Self-pay

## 2018-11-17 ENCOUNTER — Telehealth (INDEPENDENT_AMBULATORY_CARE_PROVIDER_SITE_OTHER): Payer: Self-pay

## 2018-11-17 NOTE — Telephone Encounter (Signed)
Called patient. No answer LMOM to return our call. If they return call, please ask them screening questions below. Thank you.   Do you have now or have you had in the past 7 days a fever and/or chills?   Do you have now or have you had in the past 7 days a cough?   Do you have now or have you had in the last 7 days nausea, vomiting or abdominal pain?   Have you been exposed to anyone who has tested positive for COVID-19?   Have you or anyone who lives with you traveled within the last month? 

## 2018-11-18 ENCOUNTER — Ambulatory Visit (INDEPENDENT_AMBULATORY_CARE_PROVIDER_SITE_OTHER): Payer: Self-pay | Admitting: Orthopaedic Surgery

## 2018-11-18 ENCOUNTER — Encounter (INDEPENDENT_AMBULATORY_CARE_PROVIDER_SITE_OTHER): Payer: Self-pay | Admitting: Orthopaedic Surgery

## 2018-11-18 ENCOUNTER — Other Ambulatory Visit: Payer: Self-pay

## 2018-11-18 VITALS — Ht 73.5 in | Wt 236.0 lb

## 2018-11-18 DIAGNOSIS — K7030 Alcoholic cirrhosis of liver without ascites: Secondary | ICD-10-CM

## 2018-11-18 DIAGNOSIS — Z87891 Personal history of nicotine dependence: Secondary | ICD-10-CM

## 2018-11-18 DIAGNOSIS — F1021 Alcohol dependence, in remission: Secondary | ICD-10-CM

## 2018-11-18 DIAGNOSIS — M48061 Spinal stenosis, lumbar region without neurogenic claudication: Secondary | ICD-10-CM

## 2018-11-18 DIAGNOSIS — M545 Low back pain: Secondary | ICD-10-CM

## 2018-11-18 DIAGNOSIS — E1165 Type 2 diabetes mellitus with hyperglycemia: Secondary | ICD-10-CM

## 2018-11-18 DIAGNOSIS — M4316 Spondylolisthesis, lumbar region: Secondary | ICD-10-CM

## 2018-11-18 NOTE — Progress Notes (Signed)
Office Visit Note   Patient: Gerald Olson           Date of Birth: 22-Feb-1966           MRN: [id number] Visit Date: 11/18/2018              Requested by: Gerald Blackbird, MD [place], [place] [address] PCP: Gerald Blackbird, MD   Assessment & Plan: Visit Diagnoses:  1. Spinal stenosis of lumbar region, unspecified whether neurogenic claudication present     Plan: Patient has been approved for Medicaid.  He needs to get his A1c down to 7.4 before he could be considered a surgical candidate for his advanced facet arthropathy and anterolisthesis at L5-S1.  Patient can return in 2 months and will work hard on his diabetic control.  Follow-Up Instructions: Return in about 2 months (around 01/18/2019).   Orders:  No orders of the defined types were placed in this encounter.  No orders of the defined types were placed in this encounter.     Procedures: No procedures performed   Clinical Data: No additional findings.   Subjective: Chief Complaint  Patient presents with  . 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  He states he is also had problems with his neck and radiating pain to his left hand which he states his chiropractor told him was a C7 nerve root problem.  He is used Celebrex and tramadol.  Epidural injection only gave him relief for 2 days a shot of sugars up for a few days.  He is trying to get his diabetes in better shape not currently on insulin and knows his target A1c was 7.4 to consider surgery at the L5-S1 level.  Patient is spondylolisthesis with bilateral foraminal compression at L5-S1.  No fever chills no bowel bladder symptoms.  Review of Systems type 2 diabetes with hyperglycemia A1c 8.2 improved from 9.  History of alcohol abuse alcoholic cirrhosis.  Chronic low back pain otherwise negative is a pertains HPI.    Objective: Vital Signs: Ht 6' 1.5" (1.867 m)   Wt 236 lb (107 kg)   BMI 30.71 kg/m   Physical Exam Constitutional:      Appearance: He is well-developed.  HENT:     Head: Normocephalic and atraumatic.  Eyes:     Pupils: Pupils are equal, round, and reactive to light.  Neck:     Thyroid: No thyromegaly.     Trachea: No tracheal deviation.  Cardiovascular:     Rate and Rhythm: Normal rate.  Pulmonary:     Effort: Pulmonary effort is normal.     Breath sounds: No wheezing.  Abdominal:     General: Bowel sounds are normal.     Palpations: Abdomen is soft.  Skin:    General: Skin is warm and dry.     Capillary Refill: Capillary refill takes less than 2 seconds.  Neurological:     Mental Status: He is alert and oriented to person, place, and time.  Psychiatric:        Behavior: Behavior normal.        Thought Content: Thought content normal.        Judgment: Judgment normal.     Ortho Exam patient has good cervical rotation complains of some noted crepitus in his neck with range of motion.  No atrophy upper extremities.  Good range of motion of his elbows and shoulders.  Knee and ankle jerk are intact negative logroll to the hips.  Distal pulses are palpable no plantar foot lesions.  He is amatory with a cane but can ambulate in the exam room without it.  Specialty Comments:  No specialty comments available.  Imaging: No results found.   PMFS History: Patient Active Problem List   Diagnosis Date Noted  . Alcoholic cirrhosis of liver ([place]) 06/02/2018  . Uncontrolled type 2 diabetes mellitus with hyperglycemia ([place]) 04/25/2018  . Alcohol use disorder, mild, abuse 04/25/2018   Past Medical History:  Diagnosis Date  . Calculus of gallbladder without cholecystitis without obstruction   . Chronic pain    back  . Disc degeneration   . Disc degeneration, lumbar   . Headache     Family History  Problem Relation Age of Onset  . Aneurysm Father     Past Surgical  History:  Procedure Laterality Date  . CHOLECYSTECTOMY N/A 06/02/2018   Procedure: LAPAROSCOPIC CHOLECYSTECTOMY;  Surgeon: Gerald Epley, MD;  Location: [facility] ORS;  Service: General;  Laterality: N/A;  . KNEE ARTHROSCOPY     30 Years ago   Social History   Occupational History  . Not on file  Tobacco Use  . Smoking status: Former Research scientist (life sciences)  . Smokeless tobacco: Never Used  Substance and Sexual Activity  . Alcohol use: Yes    Comment: cut back  . Drug use: No  . Sexual activity: Yes

## 2018-11-24 ENCOUNTER — Ambulatory Visit: Payer: Self-pay

## 2018-11-26 ENCOUNTER — Other Ambulatory Visit: Payer: Self-pay | Admitting: Urology

## 2018-11-28 ENCOUNTER — Ambulatory Visit: Payer: Self-pay | Attending: Family Medicine | Admitting: Family Medicine

## 2018-11-28 ENCOUNTER — Ambulatory Visit: Payer: Self-pay

## 2018-11-28 ENCOUNTER — Encounter: Payer: Self-pay | Admitting: Family Medicine

## 2018-11-28 DIAGNOSIS — M48062 Spinal stenosis, lumbar region with neurogenic claudication: Secondary | ICD-10-CM

## 2018-11-28 DIAGNOSIS — I10 Essential (primary) hypertension: Secondary | ICD-10-CM

## 2018-11-28 DIAGNOSIS — R413 Other amnesia: Secondary | ICD-10-CM

## 2018-11-28 DIAGNOSIS — G8929 Other chronic pain: Secondary | ICD-10-CM

## 2018-11-28 DIAGNOSIS — E1165 Type 2 diabetes mellitus with hyperglycemia: Secondary | ICD-10-CM

## 2018-11-28 DIAGNOSIS — M5441 Lumbago with sciatica, right side: Secondary | ICD-10-CM

## 2018-11-28 DIAGNOSIS — M5442 Lumbago with sciatica, left side: Secondary | ICD-10-CM

## 2018-11-28 DIAGNOSIS — M5412 Radiculopathy, cervical region: Secondary | ICD-10-CM

## 2018-11-28 MED ORDER — DICLOFENAC POTASSIUM 50 MG PO TABS: 50.0000 mg | ORAL_TABLET | Freq: Two times a day (BID) | ORAL | 0 refills | Status: DC

## 2018-11-28 MED ORDER — GLIMEPIRIDE 2 MG PO TABS: 2.0000 mg | ORAL_TABLET | Freq: Every day | ORAL | 1 refills | Status: DC

## 2018-11-28 NOTE — Progress Notes (Signed)
Virtual Visit via Telephone Note  I connected with Gerald Olson on 11/28/18 at  3:50 PM EDT by telephone and verified that I am speaking with the correct person using two identifiers.  Due to current restrictions and limitations on in-office visits due to the COVID-19 pandemic, today's visit was conducted by phone from my office and patient reports that he is in his car in the office parking lot   I discussed the limitations, risks, security and privacy concerns of performing an evaluation and management service by telephone and the availability of in person appointments. I also discussed with the patient that there may be a patient responsible charge related to this service. The patient expressed understanding and agreed to proceed.   History of Present Illness:      53 yo male with multiple medical issues who was recently seen by Orthopedics in follow-up of his chronic low back pain with radiation as well as more recent onset of the sensation of weakness in his left leg. Patient reports falling x 1 within the past 2 weeks after his leg gave away when he first stood up while getting out of bed. Patient was told by Orthopedics that he needs surgery on his back however he needs to have better control of his diabetes, a lower Hgb A1c before surgery can be done. He continues to have issues with low back pain with radiation which prevents him from prolonged sitting or prolonged standing.      Patient reports that his blood sugars seem to be up and down. He reports that he can eat the same things and have the same level of activity and yet one day his blood sugars are good and the next day they are elevated. He denies any current increased thirst or urinary frequency. He continues to feel as if he is unable to focus and has issues with his memory and recall and he would like to see a specialist and have cognitive testing. He reports that he was in graduate school for psychology but his health issues have  prevented him from completing his studies and he tried to self administering an exam regarding his cognitive status but was unable to focus enough to complete the test. Patient also continues to feel as if he is having weakness and a tremor in his let hand but he no longer thinks that this is related to Parkinson's but was told by the Ortho doctor that he may have a problem with the discs in his neck.       He reports that he no longer drinks alcohol but has about 1 beer before dinner otherwise he does not have much of an appetite and he has about 2 beers before bedtime to help him relax so that he can sleep otherwise his pain interferes with his sleep.  He reports that his BP earlier today was 185/100 but has been lower at times. He thinks that his BP is elevated due to pain as his back pain is about a 8-9 on a 0-10 scale. Patient was taking Celebrex for pain but he thinks that his body has gotten use to this medication so he started taking diclofenac that a friend gave him in addition to the Celebrex and he would like a RX for the diclofenac as it seems to have lessened his pain but he is not sure of the dose but he takes he 2 or 3 times per day.   Past Medical History:  Diagnosis Date  .  Calculus of gallbladder without cholecystitis without obstruction   . Chronic pain    back  . Disc degeneration   . Disc degeneration, lumbar   . Headache    Past Surgical History:  Procedure Laterality Date  . CHOLECYSTECTOMY N/A 06/02/2018   Procedure: LAPAROSCOPIC CHOLECYSTECTOMY;  Surgeon: Vickie Epley, MD;  Location: [facility] ORS;  Service: General;  Laterality: N/A;  . KNEE ARTHROSCOPY     30 Years ago   Family History  Problem Relation Age of Onset  . Aneurysm Father    Social History   Tobacco Use  . Smoking status: Former Research scientist (life sciences)  . Smokeless tobacco: Never Used  Substance Use Topics  . Alcohol use: Yes    Comment: cut back  . Drug use: No   Allergies  Allergen Reactions  . Hydrocodone  Itching    Itching, face tingling, dizziness.  [name] [Oxycodone Hcl] Itching   Review of Systems  Constitutional: Positive for malaise/fatigue. Negative for chills and fever.  HENT: Negative for congestion and sore throat.   Eyes: Negative for blurred vision and double vision.  Respiratory: Negative for cough and shortness of breath.   Cardiovascular: Negative for chest pain and palpitations.  Gastrointestinal: Positive for abdominal pain (occasional right upper and epigastric-better since his gallbladder was removed). Negative for diarrhea, nausea and vomiting.  Genitourinary: Negative for dysuria and frequency.  Musculoskeletal: Positive for back pain, falls, joint pain, myalgias and neck pain.  Skin: Negative for itching and rash.  Neurological: Positive for tremors and weakness. Negative for dizziness and headaches.  Psychiatric/Behavioral: Positive for memory loss. Negative for suicidal ideas. The patient is nervous/anxious and has insomnia.      Observations/Objective: No vital signs or physical exam was done at today's visit as visit was done by telephone. Patient did relay that he checked his BP earlier today and it was elevated at 185/100 which he believed was related to pain. Since patient reported that he was in the parking lot he was asked to come into the building so that triage RN could check his BP (triage nurse was made aware by CMA and triage nurse even checked the parking lot for patient but did not see him and patient did not come to the door of the clinic where triage nurse was initially stationed  Assessment and Plan: 1. Uncontrolled type 2 diabetes mellitus with hyperglycemia ([place]) Patient is being referred to endocrinology to help gain better control of his diabetes so that he can proceed with planned surgery for lumbar spinal stenosis per Dr. [name]. Patient continues to drink several beers daily which he is not likely taking into account at extra carbohydrates and  his long term use of alcohol his likely also impaired his ability to properly process glucose and insulin. Amaryl refill provided - glimepiride (AMARYL) 2 MG tablet; Take 1 tablet (2 mg total) by mouth daily before breakfast.  Dispense: 90 tablet; Refill: 1 - Ambulatory referral to Endocrinology  2. Chronic bilateral low back pain with bilateral sciatica Patient requests RX for diclofenac as he reports that his current Celebrex is not effective  And he was cautioned not to take diclofenac along with other NSAID's or COX II's - diclofenac (CATAFLAM) 50 MG tablet; Take 1 tablet (50 mg total) by mouth 2 (two) times daily. As needed for pain. Take after eating  Dispense: 180 tablet; Refill: 0  3. Uncontrolled hypertension Patient was asked to come into the office  Today to have his BP checked which as  he reported that his BP was elevated earlier today when he checked his BP at home. Triage RN was unable to locate patient and hopefully patient will call or return for a nurse visit if his home blood pressure remains elevated. He believes that BP was elevated secondary to pain  4. Cervical radiculopathy Patient with left hand weakness which may be related to cervical radiculopathy and this can be further evaluated by Ortho after the surgery for his lumbar stenosis  5. Memory impairment Referral will be placed to Neurology in follow-up of patient's memory impairment which is likley multifocal - Ambulatory referral to Neurology  6. Spinal stenosis of lumbar region with neurogenic claudication Patient has been seen by Dr. [name] and will be scheduled for surgery once his blood sugars are better controlled   Follow Up Instructions:Return in about 6 weeks (around 01/09/2019) for DM/HTN; NV BP check sooner.    I discussed the assessment and treatment plan with the patient. The patient was provided an opportunity to ask questions and all were answered. The patient agreed with the plan and demonstrated an  understanding of the instructions.   The patient was advised to call back or seek an in-person evaluation if the symptoms worsen or if the condition fails to improve as anticipated.  I provided 23  minutes of non-face-to-face time during this encounter.   Antony Blackbird, MD

## 2018-11-28 NOTE — Progress Notes (Signed)
Patient verified DOB Patient has taken medication today. Patient has not eaten. Patient complains of his numbers jumping from day to day and not sure why.

## 2018-11-29 MED FILL — DICLOFENAC SOD EC 50 MG TAB: 50 | 30 days supply | Qty: 60 | Fill #0

## 2018-12-02 ENCOUNTER — Encounter: Payer: Self-pay | Admitting: Family Medicine

## 2019-01-09 ENCOUNTER — Telehealth: Payer: Self-pay | Admitting: Family Medicine

## 2019-01-09 ENCOUNTER — Ambulatory Visit: Payer: Self-pay

## 2019-01-09 ENCOUNTER — Other Ambulatory Visit: Payer: Self-pay | Admitting: Family Medicine

## 2019-01-09 DIAGNOSIS — E1165 Type 2 diabetes mellitus with hyperglycemia: Secondary | ICD-10-CM

## 2019-01-09 DIAGNOSIS — F419 Anxiety disorder, unspecified: Secondary | ICD-10-CM

## 2019-01-09 DIAGNOSIS — H547 Unspecified visual loss: Secondary | ICD-10-CM

## 2019-01-09 NOTE — Telephone Encounter (Signed)
Patient called stating that the metformin is not working for them and that they would like to be prescribed the glimepiride again because it was helping more than the metformin.Patient states that their sugar was running high. Please follow up

## 2019-01-09 NOTE — Progress Notes (Signed)
Patient ID: Gerald Olson, male   DOB: 11-03-1965, 53 y.o.   MRN: [id number]   Patient left phone message requesting referrals for Ophthalmology and for a psychologist but reasons for referrals not left. Patient does have diabetes for which he needs a diabetic eye exam and has some anxiety known from past office encounters. Referrals will be placed.

## 2019-01-09 NOTE — Telephone Encounter (Signed)
Notify patient that referrals have been placed for ophthalmology and psychology. If has not heard anything about appointments within the next 2 weeks then he should call this office

## 2019-01-09 NOTE — Telephone Encounter (Signed)
Patients call taken.  Patient identified by name and date of birth.  Patient states that he has been taking his metformin and his blood sugars have been in the  200-300's.  States that his diet is not perfect but he is eating healthy given [place] present [place] of [place]. Would like to go back to glimepride  Has specialty appointments next week   Patient was advised that the provider would be informed and any information would be told to him as it came available.  Patient acknowledged understanding of advice.

## 2019-01-09 NOTE — Telephone Encounter (Signed)
Please notify patient that since he has an endocrinology appointment on Monday, he can discuss medications with endocrinologist at that appointment which is only a few days away

## 2019-01-09 NOTE — Telephone Encounter (Signed)
Patient called for a physchologst and ophthalmologist referral. Please follow up .

## 2019-01-12 ENCOUNTER — Other Ambulatory Visit: Payer: Self-pay

## 2019-01-12 ENCOUNTER — Encounter: Payer: Self-pay | Admitting: "Endocrinology

## 2019-01-12 ENCOUNTER — Ambulatory Visit (INDEPENDENT_AMBULATORY_CARE_PROVIDER_SITE_OTHER): Payer: Self-pay | Admitting: "Endocrinology

## 2019-01-12 VITALS — BP 153/99 | HR 87 | Ht 73.5 in | Wt 249.0 lb

## 2019-01-12 DIAGNOSIS — E1165 Type 2 diabetes mellitus with hyperglycemia: Secondary | ICD-10-CM

## 2019-01-12 MED ORDER — METFORMIN HCL 500 MG PO TABS: 500.0000 mg | ORAL_TABLET | Freq: Two times a day (BID) | ORAL | 3 refills | Status: DC

## 2019-01-12 NOTE — Telephone Encounter (Signed)
Spoke with patient and informed him with what provider stated and patient verbalized understanding.

## 2019-01-12 NOTE — Patient Instructions (Signed)

## 2019-01-12 NOTE — Progress Notes (Signed)
Endocrinology Consult Note       01/12/2019, 2:40 PM   Subjective:    Patient ID: Gerald Olson, male    DOB: July 03, 1966.  Gerald Olson is being seen in consultation for management of currently uncontrolled symptomatic diabetes requested by  Antony Blackbird, MD.   Past Medical History:  Diagnosis Date  . Calculus of gallbladder without cholecystitis without obstruction   . Chronic pain    back  . Disc degeneration   . Disc degeneration, lumbar   . Headache     Past Surgical History:  Procedure Laterality Date  . CHOLECYSTECTOMY N/A 06/02/2018   Procedure: LAPAROSCOPIC CHOLECYSTECTOMY;  Surgeon: Vickie Epley, MD;  Location: [facility] ORS;  Service: General;  Laterality: N/A;  . KNEE ARTHROSCOPY     30 Years ago    Social History   Socioeconomic History  . Marital status: Single    Spouse name: Not on file  . Number of children: Not on file  . Years of education: Not on file  . Highest education level: Not on file  Occupational History  . Not on file  Social Needs  . Financial resource strain: Not on file  . Food insecurity:    Worry: Not on file    Inability: Not on file  . Transportation needs:    Medical: Not on file    Non-medical: Not on file  Tobacco Use  . Smoking status: Former Research scientist (life sciences)  . Smokeless tobacco: Never Used  Substance and Sexual Activity  . Alcohol use: Yes    Comment: cut back  . Drug use: No  . Sexual activity: Yes  Lifestyle  . Physical activity:    Days per week: Not on file    Minutes per session: Not on file  . Stress: Not on file  Relationships  . Social connections:    Talks on phone: Not on file    Gets together: Not on file    Attends religious service: Not on file    Active member of club or organization: Not on file    Attends meetings of clubs or organizations: Not on file    Relationship status: Not on file  Other Topics Concern  . Not on file   Social History Narrative  . Not on file    Family History  Problem Relation Age of Onset  . Aneurysm Father     Outpatient Encounter Medications as of 01/12/2019  Medication Sig  . Ashwagandha 500 MG CAPS Take 1 capsule by mouth 3 (three) times daily.  Marland Kitchen BLACK CURRANT SEED OIL PO Take by mouth.  . Cinnamon 500 MG capsule Take 1,000 mg by mouth 2 (two) times daily.  . diclofenac (CATAFLAM) 50 MG tablet Take 1 tablet (50 mg total) by mouth 2 (two) times daily. As needed for pain. Take after eating  . diclofenac sodium (VOLTAREN) 1 % GEL Apply 1 application topically 3 (three) times daily as needed (pain).  Marland Kitchen glucose blood (TRUE METRIX BLOOD GLUCOSE TEST) test strip Use as instructed  . metFORMIN (GLUCOPHAGE) 500 MG tablet Take 1 tablet (500 mg total) by mouth 2 (two)  times daily with a meal.  . methocarbamol (ROBAXIN) 500 MG tablet Take 1 tablet (500 mg total) by mouth 2 (two) times daily.  . Multiple Vitamins-Minerals (MULTIVITAMIN WITH MINERALS) tablet Take 1 tablet by mouth daily.  . naproxen sodium (ALEVE) 220 MG tablet Take 660 mg by mouth daily as needed (pain).  . Nutritional Supplements (DHEA PO) Take 100 mg by mouth daily.  . ondansetron (ZOFRAN) 4 MG tablet Take 1 tablet (4 mg total) by mouth every 8 (eight) hours as needed for nausea or vomiting.  Marland Kitchen OVER THE COUNTER MEDICATION Take 1 drop by mouth 2 (two) times daily. CBD Oil  . traMADol (ULTRAM) 50 MG tablet Take 1 tablet (50 mg total) by mouth every 12 (twelve) hours as needed for severe pain.  . TRUEPLUS LANCETS 28G MISC Use as directed  . [DISCONTINUED] glimepiride (AMARYL) 2 MG tablet Take 1 tablet (2 mg total) by mouth daily before breakfast. (Patient not taking: Reported on 01/12/2019)  . [DISCONTINUED] metFORMIN (GLUCOPHAGE) 500 MG tablet Two pills after the evening meal   No facility-administered encounter medications on file as of 01/12/2019.     ALLERGIES: Allergies  Allergen Reactions  . Hydrocodone Itching     Itching, face tingling, dizziness.  Gerald Olson [Oxycodone Hcl] Itching    VACCINATION STATUS: Immunization History  Administered Date(s) Administered  . Tdap 04/25/2018    Diabetes  He presents for his initial diabetic visit. He has type 2 diabetes mellitus. Onset time: He was diagnosed at approximate age of 23 years. His disease course has been improving. There are no hypoglycemic associated symptoms. Pertinent negatives for hypoglycemia include no confusion, headaches, pallor or seizures. Associated symptoms include polydipsia and polyuria. Pertinent negatives for diabetes include no chest pain, no fatigue, no polyphagia and no weakness. There are no hypoglycemic complications. There are no diabetic complications. Risk factors for coronary artery disease include diabetes mellitus, hypertension, male sex, obesity, tobacco exposure and sedentary lifestyle. Current diabetic treatment includes oral agent (monotherapy). His weight is increasing steadily. He is following a generally unhealthy diet. When asked about meal planning, he reported none. He has not had a previous visit with a dietitian. He rarely participates in exercise. His overall blood glucose range is >200 mg/dl. (He brought in a meter showing random blood glucose profile, average greater than 250 mg per DL.  His recent A1c was 8.2% on October 06, 2018.) An ACE inhibitor/angiotensin II receptor blocker is not being taken.  Hypertension  This is a chronic problem. The current episode started more than 1 year ago. Pertinent negatives include no chest pain, headaches, neck pain, palpitations or shortness of breath. Risk factors for coronary artery disease include diabetes mellitus, male gender, obesity, sedentary lifestyle and smoking/tobacco exposure. Past treatments include nothing.     Review of Systems  Constitutional: Negative for chills, fatigue, fever and unexpected weight change.  HENT: Negative for dental problem, mouth sores  and trouble swallowing.   Eyes: Negative for visual disturbance.  Respiratory: Negative for cough, choking, chest tightness, shortness of breath and wheezing.   Cardiovascular: Negative for chest pain, palpitations and leg swelling.  Gastrointestinal: Negative for abdominal distention, abdominal pain, constipation, diarrhea, nausea and vomiting.  Endocrine: Positive for polydipsia and polyuria. Negative for polyphagia.  Genitourinary: Negative for dysuria, flank pain, hematuria and urgency.  Musculoskeletal: Positive for back pain and gait problem. Negative for myalgias and neck pain.  Skin: Negative for pallor, rash and wound.  Neurological: Negative for seizures, syncope, weakness, numbness and  headaches.  Psychiatric/Behavioral: Negative for confusion and dysphoric mood.    Objective:    BP (!) 153/99   Pulse 87   Ht 6' 1.5" (1.867 m)   Wt 249 lb (112.9 kg)   BMI 32.41 kg/m   Wt Readings from Last 3 Encounters:  01/12/19 249 lb (112.9 kg)  11/18/18 236 lb (107 kg)  10/31/18 239 lb 12.8 oz (108.8 kg)     Physical Exam Constitutional:      General: He is not in acute distress.    Appearance: He is well-developed.  HENT:     Head: Normocephalic and atraumatic.  Neck:     Musculoskeletal: Normal range of motion and neck supple.     Thyroid: No thyromegaly.     Trachea: No tracheal deviation.  Cardiovascular:     Rate and Rhythm: Normal rate.     Pulses:          Dorsalis pedis pulses are 1+ on the right side and 1+ on the left side.       Posterior tibial pulses are 1+ on the right side and 1+ on the left side.     Heart sounds: Normal heart sounds, S1 normal and S2 normal. No murmur. No gallop.   Pulmonary:     Effort: Pulmonary effort is normal. No respiratory distress.     Breath sounds: No wheezing.  Abdominal:     General: There is no distension.     Tenderness: There is no abdominal tenderness. There is no guarding.  Musculoskeletal:     Right shoulder: He  exhibits no swelling and no deformity.     Comments: He walks with a cane given his history of back pain.  Skin:    General: Skin is warm and dry.     Findings: No rash.     Nails: There is no clubbing.   Neurological:     Mental Status: He is alert and oriented to person, place, and time.     Cranial Nerves: No cranial nerve deficit.     Sensory: No sensory deficit.     Gait: Gait normal.     Deep Tendon Reflexes: Reflexes are normal and symmetric.  Psychiatric:        Mood and Affect: Mood normal.        Speech: Speech normal.        Behavior: Behavior is cooperative.        Thought Content: Thought content normal.        Judgment: Judgment normal.       CMP ( most recent) CMP     Component Value Date/Time   NA 136 10/06/2018 1645   K 5.0 10/06/2018 1645   CL 101 10/06/2018 1645   CO2 25 10/06/2018 1645   GLUCOSE 272 (H) 10/06/2018 1645   GLUCOSE 309 (H) 06/02/2018 1738   BUN 14 10/06/2018 1645   CREATININE 0.84 10/06/2018 1645   CALCIUM 8.5 (L) 10/06/2018 1645   PROT 6.6 10/06/2018 1645   ALBUMIN 4.0 10/06/2018 1645   AST 41 (H) 10/06/2018 1645   ALT 40 10/06/2018 1645   ALKPHOS 105 10/06/2018 1645   BILITOT 0.4 10/06/2018 1645   GFRNONAA 101 10/06/2018 1645   GFRAA 116 10/06/2018 1645    Diabetic Labs (most recent): Lab Results  Component Value Date   HGBA1C 8.2 (A) 10/06/2018   HGBA1C 9.2 (H) 06/02/2018   HGBA1C 9.2 (A) 04/25/2018    Lipid Panel     Component Value  Date/Time   CHOL 135 08/06/2018 1210   TRIG 129 08/06/2018 1210   HDL 35 (L) 08/06/2018 1210   CHOLHDL 3.9 08/06/2018 1210   LDLCALC 74 08/06/2018 1210      Assessment & Plan:   1. Uncontrolled type 2 diabetes mellitus with hyperglycemia ([place])  - Gerald Olson has currently uncontrolled symptomatic type 2 DM since 53 years of age, comes  with  A1c of 8.2% improving from 9.2 %.  He does not have recent A1c. - I had a long discussion with him about the progressive nature of diabetes  and the pathology behind its complications. -his diabetes is complicated by obesity/sedentary life and he remains at a high risk for more acute and chronic complications which include CAD, CVA, CKD, retinopathy, and neuropathy. These are all discussed in detail with him.  - I have counseled him on diet management and weight loss, by adopting a carbohydrate restricted/protein rich diet. - he admits that there is a room for improvement in his food and drink choices. - Suggestion is made for him to avoid simple carbohydrates  from his diet including Cakes, Sweet Desserts, Ice Cream, Soda (diet and regular), Sweet Tea, Candies, Chips, Cookies, Store Bought Juices, Alcohol in Excess of  1-2 drinks a day, Artificial Sweeteners,  Coffee Creamer, and "Sugar-free" Products. This will help patient to have more stable blood glucose profile and potentially avoid unintended weight gain.  - I encouraged him to switch to  unprocessed or minimally processed complex starch and increased protein intake (animal or plant source), fruits, and vegetables.  - he is advised to stick to a routine mealtimes to eat 3 meals  a day and avoid unnecessary snacks ( to snack only to correct hypoglycemia).   - he will be scheduled with Jearld Fenton, RDN, CDE for individualized diabetes education.  - I have approached him with the following individualized plan to manage diabetes and patient agrees:   -Given his A1c of 8.2% in February 2020 he may not need insulin treatment for now.  However, he is asked to start monitoring blood glucose 4 times a day-before meals and at bedtime and return in 10 days with his meter and logs.  Will also get new set of labs including CMP, A1c, thyroid function test.  - he is encouraged to call clinic for blood glucose levels less than 70 or above 300 mg /dl. -In the meantime, he is advised to continue metformin 500 mg ER twice a day therapeutically suitable for patient . -He has not taken his  glimepiride for several weeks, will be kept off of this medication for now.  - Patient specific target  A1c;  LDL, HDL, Triglycerides, and  Waist Circumference were discussed in detail.  2) Blood Pressure /Hypertension:  his blood pressure is uncontrolled to target.  He is not on antihypertensive medications.  He will be considered for low-dose lisinopril or ARB on his next visit if blood pressure remains above 140/80 mmHg.   3) Lipids/Hyperlipidemia:   Review of his recent lipid panel showed  controlled  LDL at 74 .  He is not on statin medications.  He will be considered for repeat fasting lipid panel on subsequent visits.  4)  Weight/Diet:  Body mass index is 32.41 kg/m.  -   clearly complicating his diabetes care.  I discussed with him the fact that loss of 5 - 10% of his  current body weight will have the most impact on his diabetes management.  CDE Consult will be initiated . Exercise, and detailed carbohydrates information provided  -  detailed on discharge instructions.  5) Chronic Care/Health Maintenance:  -he  Is not on ACEI/ARB and Statin medications and  is encouraged to initiate and continue to follow up with Ophthalmology, Dentist,  Podiatrist at least yearly or according to recommendations, and advised to  stay away from smoking. I have recommended yearly flu vaccine and pneumonia vaccine at least every 5 years; moderate intensity exercise for up to 150 minutes weekly; and  sleep for at least 7 hours a day.  - he is  advised to maintain close follow up with Antony Blackbird, MD for primary care needs, as well as his other providers for optimal and coordinated care.  - Time spent with the patient: 45 minutes, of which >50% was spent in obtaining information about his symptoms, reviewing his previous labs/studies, evaluations, and treatments, counseling him about his currently uncontrolled type 2 diabetes, elevated blood pressure, and developing plans for long term treatment based on the  latest standards of care/guidelines.  Please refer to " Patient Self Inventory" in the Media  tab for reviewed elements of pertinent patient history.  Gerald Olson participated in the discussions, expressed understanding, and voiced agreement with the above plans.  All questions were answered to his satisfaction. he is encouraged to contact clinic should he have any questions or concerns prior to his return visit.  Follow up plan: - Return in about 10 days (around 01/22/2019) for Labs Today- Non-Fasting Ok, Follow up with Pre-visit Labs, Meter, and Logs.  Glade Lloyd, MD [facility] [address] [place], [place] [address] Phone: [phone]  Fax: [phone]    01/12/2019, 2:40 PM  This note was partially dictated with voice recognition software. Similar sounding words can be transcribed inadequately or may not  be corrected upon review.

## 2019-01-13 LAB — COMPLETE METABOLIC PANEL WITH GFR
AG Ratio: 1.5 (calc) (ref 1.0–2.5)
ALT: 36 U/L (ref 9–46)
AST: 35 U/L (ref 10–35)
Albumin: 4.1 g/dL (ref 3.6–5.1)
Alkaline phosphatase (APISO): 83 U/L (ref 35–144)
BUN: 14 mg/dL (ref 7–25)
CO2: 25 mmol/L (ref 20–32)
Calcium: 9.2 mg/dL (ref 8.6–10.3)
Chloride: 100 mmol/L (ref 98–110)
Creat: 0.8 mg/dL (ref 0.70–1.33)
GFR, Est African American: 119 mL/min/{1.73_m2} (ref >=60)
GFR, Est Non African American: 103 mL/min/{1.73_m2} (ref >=60)
Globulin: 2.8 g/dL (calc) (ref 1.9–3.7)
Glucose, Bld: 308 mg/dL — ABNORMAL HIGH (ref 65–139)
Potassium: 4.4 mmol/L (ref 3.5–5.3)
Sodium: 135 mmol/L (ref 135–146)
Total Bilirubin: 0.4 mg/dL (ref 0.2–1.2)
Total Protein: 6.9 g/dL (ref 6.1–8.1)

## 2019-01-13 LAB — HEMOGLOBIN A1C
Hgb A1c MFr Bld: 8.3 % of total Hgb — ABNORMAL HIGH (ref <5.7)
Mean Plasma Glucose: 192 (calc)
eAG (mmol/L): 10.6 (calc)

## 2019-01-13 LAB — VITAMIN D 25 HYDROXY (VIT D DEFICIENCY, FRACTURES): Vit D, 25-Hydroxy: 33 ng/mL (ref 30–100)

## 2019-01-14 ENCOUNTER — Telehealth: Payer: Self-pay

## 2019-01-14 ENCOUNTER — Other Ambulatory Visit: Payer: Self-pay | Admitting: Urology

## 2019-01-14 NOTE — Telephone Encounter (Signed)
As per Maria Ramirez Perez, [employer] [facility], the patient's referral is now closed.  

## 2019-01-15 MED FILL — TRUE METRIX TEST STRIP: 30 days supply | Qty: 100 | Fill #2

## 2019-01-15 MED FILL — ?METFORMIN HCL 500MG TABLET: 500 | 30 days supply | Qty: 60 | Fill #2

## 2019-01-20 ENCOUNTER — Encounter: Payer: Self-pay | Admitting: Orthopaedic Surgery

## 2019-01-20 ENCOUNTER — Ambulatory Visit (INDEPENDENT_AMBULATORY_CARE_PROVIDER_SITE_OTHER): Payer: Self-pay | Admitting: Orthopaedic Surgery

## 2019-01-20 ENCOUNTER — Other Ambulatory Visit: Payer: Self-pay

## 2019-01-20 VITALS — Ht 74.0 in | Wt 239.0 lb

## 2019-01-20 DIAGNOSIS — M48061 Spinal stenosis, lumbar region without neurogenic claudication: Secondary | ICD-10-CM

## 2019-01-20 DIAGNOSIS — E1165 Type 2 diabetes mellitus with hyperglycemia: Secondary | ICD-10-CM

## 2019-01-20 NOTE — Progress Notes (Signed)
Office Visit Note   Patient: Gerald Olson           Date of Birth: 05/08/1966           MRN: [id number] Visit Date: 01/20/2019              Requested by: Antony Blackbird, MD [place], [place] [id number] PCP: Antony Blackbird, MD   Assessment & Plan: Visit Diagnoses:  1. Lumbar foraminal stenosis   2. Uncontrolled type 2 diabetes mellitus with hyperglycemia ([place])     Plan: Patient needs to improve his diabetic control he understands he has to get his A1c below 7.5 before elective lumbar surgery is an option.  Recheck 6 months.  We discussed avoiding alcohol, seeing his PCP possibly starting insulin or other oral medication what ever it took to get his A1c to acceptable level.  Diabetic diet discussed in detail.  Follow-Up Instructions: Return in about 6 months (around 07/23/2019).   Orders:  No orders of the defined types were placed in this encounter.  No orders of the defined types were placed in this encounter.     Procedures: No procedures performed   Clinical Data: No additional findings.   Subjective: Chief Complaint  Patient presents with  . Lower Back - Pain, Follow-up    HPI 53 year old male returns with ongoing problems with back pain leg pain with L5-S1 Facet arthropathy anterolisthesis and bilateral impingement L5 and S1.  He has diabetes still drinking A1c is 8.3.  He has been on diclofenac, Celebrex, aspirin.  He is also had some associated neck pain.  States he is worried about taking anti-inflammatories with his liver.  Recent lab tests renal and liver tests were entirely normal.  Ultrasound last year showed he had a fatty liver and last year he had his gallbladder removed for gallstones which did well.  Patient still has back pain sharp pain in his leg ambulates with a cane.  He is applying for disability and states he was approved for Medicaid.  Previous A1c was 8.2 in 7 months ago it was 9.2.  [place]  Review of  Systems 14 point system update unchanged from 11/18/2018 office visit other than as mentioned in HPI.   Objective: Vital Signs: Ht 6\' 2"  (1.88 m)   Wt 239 lb (108.4 kg)   BMI 30.69 kg/m   Physical Exam Constitutional:      Appearance: He is well-developed.  HENT:     Head: Normocephalic and atraumatic.  Eyes:     Pupils: Pupils are equal, round, and reactive to light.  Neck:     Thyroid: No thyromegaly.     Trachea: No tracheal deviation.  Cardiovascular:     Rate and Rhythm: Normal rate.  Pulmonary:     Effort: Pulmonary effort is normal.     Breath sounds: No wheezing.  Abdominal:     General: Bowel sounds are normal.     Palpations: Abdomen is soft.  Skin:    General: Skin is warm and dry.     Capillary Refill: Capillary refill takes less than 2 seconds.  Neurological:     Mental Status: He is alert and oriented to person, place, and time.  Psychiatric:        Behavior: Behavior normal.        Thought Content: Thought content normal.        Judgment: Judgment normal.     Ortho Exam patient complains  of pain with cervical range of motion more on the left than right.  No atrophy upper extremities.  Biceps triceps testing shows counter pulling of opposing muscle forces.  He ambulates with a cane but can ambulate without it.  Knee and ankle jerk are intact negative logroll to his hips.  Specialty Comments:  No specialty comments available.  Imaging: No results found.   PMFS History: Patient Active Problem List   Diagnosis Date Noted  . Alcoholic cirrhosis of liver ([place]) 06/02/2018  . Uncontrolled type 2 diabetes mellitus with hyperglycemia ([place]) 04/25/2018  . Alcohol use disorder, mild, abuse 04/25/2018   Past Medical History:  Diagnosis Date  . Calculus of gallbladder without cholecystitis without obstruction   . Chronic pain    back  . Disc degeneration   . Disc degeneration, lumbar   . Headache     Family History  Problem Relation Age of Onset  .  Aneurysm Father     Past Surgical History:  Procedure Laterality Date  . CHOLECYSTECTOMY N/A 06/02/2018   Procedure: LAPAROSCOPIC CHOLECYSTECTOMY;  Surgeon: Vickie Epley, MD;  Location: [facility] ORS;  Service: General;  Laterality: N/A;  . KNEE ARTHROSCOPY     30 Years ago   Social History   Occupational History  . Not on file  Tobacco Use  . Smoking status: Former Research scientist (life sciences)  . Smokeless tobacco: Never Used  Substance and Sexual Activity  . Alcohol use: Yes    Comment: cut back  . Drug use: No  . Sexual activity: Yes

## 2019-01-23 ENCOUNTER — Ambulatory Visit (HOSPITAL_COMMUNITY)
Admission: RE | Admit: 2019-01-23 | Discharge: 2019-01-23 | Disposition: A | Discharge status: Home / Self Care | Payer: Medicaid Other | Source: Ambulatory Visit | Attending: Urology | Admitting: Urology

## 2019-01-23 ENCOUNTER — Other Ambulatory Visit: Payer: Self-pay

## 2019-01-23 ENCOUNTER — Ambulatory Visit: Payer: Self-pay | Admitting: "Endocrinology

## 2019-01-23 DIAGNOSIS — R351 Nocturia: Secondary | ICD-10-CM | POA: Diagnosis present

## 2019-01-23 DIAGNOSIS — R8290 Unspecified abnormal findings in urine: Secondary | ICD-10-CM | POA: Insufficient documentation

## 2019-01-24 ENCOUNTER — Telehealth: Payer: Self-pay | Admitting: Cardiology

## 2019-01-24 ENCOUNTER — Other Ambulatory Visit: Payer: Self-pay | Admitting: Cardiology

## 2019-01-24 DIAGNOSIS — Z20822 Contact with and (suspected) exposure to covid-19: Secondary | ICD-10-CM

## 2019-01-24 NOTE — Telephone Encounter (Signed)
Spoke to patient regarding possible exposure, covid test ordered.

## 2019-01-24 NOTE — Addendum Note (Signed)
Addended by: Sunday Shams on: 01/24/2019 09:17 AM   Modules accepted: Orders

## 2019-01-25 ENCOUNTER — Other Ambulatory Visit: Payer: Self-pay

## 2019-01-25 DIAGNOSIS — Z20822 Contact with and (suspected) exposure to covid-19: Secondary | ICD-10-CM

## 2019-01-26 LAB — NOVEL CORONAVIRUS, NAA: SARS-CoV-2, NAA: NOT DETECTED

## 2019-01-27 ENCOUNTER — Ambulatory Visit: Payer: Self-pay | Admitting: Psychology

## 2019-01-28 ENCOUNTER — Ambulatory Visit (INDEPENDENT_AMBULATORY_CARE_PROVIDER_SITE_OTHER): Payer: Self-pay | Admitting: "Endocrinology

## 2019-01-28 ENCOUNTER — Other Ambulatory Visit: Payer: Self-pay

## 2019-01-28 ENCOUNTER — Encounter: Payer: Self-pay | Admitting: "Endocrinology

## 2019-01-28 DIAGNOSIS — I10 Essential (primary) hypertension: Secondary | ICD-10-CM

## 2019-01-28 DIAGNOSIS — E1159 Type 2 diabetes mellitus with other circulatory complications: Secondary | ICD-10-CM | POA: Insufficient documentation

## 2019-01-28 DIAGNOSIS — E6609 Other obesity due to excess calories: Secondary | ICD-10-CM

## 2019-01-28 DIAGNOSIS — E1165 Type 2 diabetes mellitus with hyperglycemia: Secondary | ICD-10-CM

## 2019-01-28 DIAGNOSIS — Z6832 Body mass index (BMI) 32.0-32.9, adult: Secondary | ICD-10-CM

## 2019-01-28 MED ORDER — METFORMIN HCL ER 500 MG PO TB24: 500.0000 mg | ORAL_TABLET | Freq: Every day | ORAL | 3 refills | Status: DC

## 2019-01-28 MED ORDER — GLIPIZIDE ER 5 MG PO TB24: 5.0000 mg | ORAL_TABLET | Freq: Every day | ORAL | 3 refills | Status: DC

## 2019-01-28 NOTE — Progress Notes (Signed)
01/28/2019, 5:56 PM                                                    Endocrinology Telehealth Visit Follow up Note -During COVID -19 Pandemic  This visit type was conducted due to national recommendations for restrictions regarding the COVID-19 Pandemic  in an effort to limit this patient's exposure and mitigate transmission of the corona virus.  Due to his co-morbid illnesses, Gerald Olson is at  moderate to high risk for complications without adequate follow up.  This format is felt to be most appropriate for him at this time.  I connected with this patient on 01/28/2019   by telephone and verified that I am speaking with the correct person using two identifiers. Gerald Olson, 02-24-1966. he has verbally consented to this visit. All issues noted in this document were discussed and addressed. The format was not optimal for physical exam.     Subjective:    Patient ID: Gerald Olson, male    DOB: Apr 02, 1966.  Gerald Olson is being engaged in telehealth for follow-up for management of currently uncontrolled symptomatic diabetes requested by  Antony Blackbird, MD.   Past Medical History:  Diagnosis Date  . Calculus of gallbladder without cholecystitis without obstruction   . Chronic pain    back  . Disc degeneration   . Disc degeneration, lumbar   . Headache     Past Surgical History:  Procedure Laterality Date  . CHOLECYSTECTOMY N/A 06/02/2018   Procedure: LAPAROSCOPIC CHOLECYSTECTOMY;  Surgeon: Vickie Epley, MD;  Location: [facility] ORS;  Service: General;  Laterality: N/A;  . KNEE ARTHROSCOPY     30 Years ago    Social History   Socioeconomic History  . Marital status: Single    Spouse name: Not on file  . Number of children: Not on file  . Years of education: Not on file  . Highest education level: Not on file  Occupational History  . Not on file  Social Needs  . Financial resource strain:  Not on file  . Food insecurity:    Worry: Not on file    Inability: Not on file  . Transportation needs:    Medical: Not on file    Non-medical: Not on file  Tobacco Use  . Smoking status: Former Research scientist (life sciences)  . Smokeless tobacco: Never Used  Substance and Sexual Activity  . Alcohol use: Yes    Comment: cut back  . Drug use: No  . Sexual activity: Yes  Lifestyle  . Physical activity:    Days per week: Not on file    Minutes per session: Not on file  . Stress: Not on file  Relationships  . Social connections:    Talks on phone: Not on file    Gets together: Not on file    Attends religious service: Not on file    Active member of club or organization:  Not on file    Attends meetings of clubs or organizations: Not on file    Relationship status: Not on file  Other Topics Concern  . Not on file  Social History Narrative  . Not on file    Family History  Problem Relation Age of Onset  . Aneurysm Father     Outpatient Encounter Medications as of 01/28/2019  Medication Sig  . Ashwagandha 500 MG CAPS Take 1 capsule by mouth 3 (three) times daily.  Marland Kitchen BLACK CURRANT SEED OIL PO Take by mouth.  . Cinnamon 500 MG capsule Take 1,000 mg by mouth 2 (two) times daily.  . diclofenac (CATAFLAM) 50 MG tablet Take 1 tablet (50 mg total) by mouth 2 (two) times daily. As needed for pain. Take after eating  . diclofenac sodium (VOLTAREN) 1 % GEL Apply 1 application topically 3 (three) times daily as needed (pain).  Marland Kitchen glipiZIDE (GLUCOTROL XL) 5 MG 24 hr tablet Take 1 tablet (5 mg total) by mouth daily with breakfast.  . glucose blood (TRUE METRIX BLOOD GLUCOSE TEST) test strip Use as instructed  . metFORMIN (GLUCOPHAGE XR) 500 MG 24 hr tablet Take 1 tablet (500 mg total) by mouth daily with breakfast.  . methocarbamol (ROBAXIN) 500 MG tablet Take 1 tablet (500 mg total) by mouth 2 (two) times daily.  . Multiple Vitamins-Minerals (MULTIVITAMIN WITH MINERALS) tablet Take 1 tablet by mouth daily.  .  naproxen sodium (ALEVE) 220 MG tablet Take 660 mg by mouth daily as needed (pain).  . Nutritional Supplements (DHEA PO) Take 100 mg by mouth daily.  . ondansetron (ZOFRAN) 4 MG tablet Take 1 tablet (4 mg total) by mouth every 8 (eight) hours as needed for nausea or vomiting.  Marland Kitchen OVER THE COUNTER MEDICATION Take 1 drop by mouth 2 (two) times daily. CBD Oil  . traMADol (ULTRAM) 50 MG tablet Take 1 tablet (50 mg total) by mouth every 12 (twelve) hours as needed for severe pain. (Patient not taking: Reported on 01/20/2019)  . TRUEPLUS LANCETS 28G MISC Use as directed  . [DISCONTINUED] metFORMIN (GLUCOPHAGE) 500 MG tablet Take 1 tablet (500 mg total) by mouth 2 (two) times daily with a meal.   No facility-administered encounter medications on file as of 01/28/2019.     ALLERGIES: Allergies  Allergen Reactions  . Hydrocodone Itching    Itching, face tingling, dizziness.  [name] [Oxycodone Hcl] Itching    VACCINATION STATUS: Immunization History  Administered Date(s) Administered  . Tdap 04/25/2018    Diabetes  He presents for his follow-up diabetic visit. He has type 2 diabetes mellitus. Onset time: He was diagnosed at approximate age of 24 years. His disease course has been worsening. There are no hypoglycemic associated symptoms. Pertinent negatives for hypoglycemia include no confusion, headaches, pallor or seizures. Associated symptoms include polydipsia and polyuria. Pertinent negatives for diabetes include no chest pain, no fatigue, no polyphagia and no weakness. There are no hypoglycemic complications. There are no diabetic complications. Risk factors for coronary artery disease include diabetes mellitus, hypertension, male sex, obesity, tobacco exposure and sedentary lifestyle. Current diabetic treatment includes oral agent (monotherapy). His weight is increasing steadily. He is following a generally unhealthy diet. When asked about meal planning, he reported none. He has not had a  previous visit with a dietitian. He rarely participates in exercise. His breakfast blood glucose range is generally 180-200 mg/dl. His lunch blood glucose range is generally 180-200 mg/dl. His dinner blood glucose range is generally 180-200 mg/dl.  His overall blood glucose range is 180-200 mg/dl. (His recent labs show A1c of 8.3%.) An ACE inhibitor/angiotensin II receptor blocker is not being taken.  Hypertension  This is a chronic problem. The current episode started more than 1 year ago. Pertinent negatives include no chest pain, headaches, neck pain, palpitations or shortness of breath. Risk factors for coronary artery disease include diabetes mellitus, male gender, obesity, sedentary lifestyle and smoking/tobacco exposure. Past treatments include nothing.     Review of Systems  Constitutional: Negative for chills, fatigue, fever and unexpected weight change.  HENT: Negative for dental problem, mouth sores and trouble swallowing.   Eyes: Negative for visual disturbance.  Respiratory: Negative for cough, choking, chest tightness, shortness of breath and wheezing.   Cardiovascular: Negative for chest pain, palpitations and leg swelling.  Gastrointestinal: Negative for abdominal distention, abdominal pain, constipation, diarrhea, nausea and vomiting.  Endocrine: Positive for polydipsia and polyuria. Negative for polyphagia.  Genitourinary: Negative for dysuria, flank pain, hematuria and urgency.  Musculoskeletal: Positive for back pain and gait problem. Negative for myalgias and neck pain.  Skin: Negative for pallor, rash and wound.  Neurological: Negative for seizures, syncope, weakness, numbness and headaches.  Psychiatric/Behavioral: Negative for confusion and dysphoric mood.    Objective:    There were no vitals taken for this visit.  Wt Readings from Last 3 Encounters:  01/20/19 239 lb (108.4 kg)  01/12/19 249 lb (112.9 kg)  11/18/18 236 lb (107 kg)     CMP ( most recent) CMP      Component Value Date/Time   NA 135 01/12/2019 1452   NA 136 10/06/2018 1645   K 4.4 01/12/2019 1452   CL 100 01/12/2019 1452   CO2 25 01/12/2019 1452   GLUCOSE 308 (H) 01/12/2019 1452   BUN 14 01/12/2019 1452   BUN 14 10/06/2018 1645   CREATININE 0.80 01/12/2019 1452   CALCIUM 9.2 01/12/2019 1452   PROT 6.9 01/12/2019 1452   PROT 6.6 10/06/2018 1645   ALBUMIN 4.0 10/06/2018 1645   AST 35 01/12/2019 1452   ALT 36 01/12/2019 1452   ALKPHOS 105 10/06/2018 1645   BILITOT 0.4 01/12/2019 1452   BILITOT 0.4 10/06/2018 1645   GFRNONAA 103 01/12/2019 1452   GFRAA 119 01/12/2019 1452    Diabetic Labs (most recent): Lab Results  Component Value Date   HGBA1C 8.3 (H) 01/12/2019   HGBA1C 8.2 (A) 10/06/2018   HGBA1C 9.2 (H) 06/02/2018    Lipid Panel     Component Value Date/Time   CHOL 135 08/06/2018 1210   TRIG 129 08/06/2018 1210   HDL 35 (L) 08/06/2018 1210   CHOLHDL 3.9 08/06/2018 1210   LDLCALC 74 08/06/2018 1210      Assessment & Plan:   1. Uncontrolled type 2 diabetes mellitus with hyperglycemia (HCC)  - Gerald Olson has currently uncontrolled symptomatic type 2 DM since 53 years of age. -He reports significantly above target glycemic profile both fasting and postprandial, A1c previsit was 8.3%.  - I had a long discussion with him about the progressive nature of diabetes and the pathology behind its complications. -his diabetes is complicated by obesity/sedentary life and he remains at a high risk for more acute and chronic complications which include CAD, CVA, CKD, retinopathy, and neuropathy. These are all discussed in detail with him.  - I have counseled him on diet management and weight loss, by adopting a carbohydrate restricted/protein rich diet.  - Patient admits there is a room for improvement  in his diet and drink choices. -  Suggestion is made for him to avoid simple carbohydrates  from his diet including Cakes, Sweet Desserts / Pastries, Ice Cream, Soda  (diet and regular), Sweet Tea, Candies, Chips, Cookies, Store Bought Juices, Alcohol in Excess of  1-2 drinks a day, Artificial Sweeteners, and "Sugar-free" Products. This will help patient to have stable blood glucose profile and potentially avoid unintended weight gain.   - I encouraged him to switch to  unprocessed or minimally processed complex starch and increased protein intake (animal or plant source), fruits, and vegetables.  - he is advised to stick to a routine mealtimes to eat 3 meals  a day and avoid unnecessary snacks ( to snack only to correct hypoglycemia).   - he will be scheduled with Jearld Fenton, RDN, CDE for individualized diabetes education.  - I have approached him with the following individualized plan to manage diabetes and patient agrees:   -His previsit labs show A1c of 8.3%, will not require insulin treatment to control glycemic profile to target.    -He is advised to continue metformin 1000 mg ER once a day after breakfast, and discussed and added Glucotrol 5 mg XL p.o. daily at breakfast as well.    -Advised to continue monitoring blood glucose 2 times a day daily before breakfast and at bedtime.  - he is encouraged to call clinic for blood glucose levels less than 70 or above 300 mg /dl.  - Patient specific target  A1c;  LDL, HDL, Triglycerides, and  Waist Circumference were discussed in detail.  2) Blood Pressure /Hypertension: He is not on antihypertensive medications.  He will be considered for low-dose lisinopril or ARB on his next visit if blood pressure remains above 140/80 mmHg.   3) Lipids/Hyperlipidemia:   Review of his recent lipid panel showed  controlled  LDL at 74 .  He is not on statin medications.  He will be considered for repeat fasting lipid panel on subsequent visits.  4)  Weight/Diet:   -   clearly complicating his diabetes care.  I discussed with him the fact that loss of 5 - 10% of his  current body weight will have the most impact on  his diabetes management.  CDE Consult will be initiated . Exercise, and detailed carbohydrates information provided  -  detailed on discharge instructions.  5) Chronic Care/Health Maintenance:  -he  Is not on ACEI/ARB and Statin medications and  is encouraged to initiate and continue to follow up with Ophthalmology, Dentist,  Podiatrist at least yearly or according to recommendations, and advised to  stay away from smoking. I have recommended yearly flu vaccine and pneumonia vaccine at least every 5 years; moderate intensity exercise for up to 150 minutes weekly; and  sleep for at least 7 hours a day.  - he is  advised to maintain close follow up with Antony Blackbird, MD for primary care needs, as well as his other providers for optimal and coordinated care.  - Patient Care Time Today:  25 min, of which >50% was spent in reviewing his  current and  previous labs/studies, previous treatments, and medications doses and developing a plan for long-term care based on the latest recommendations for standards of care.  Gerald Olson participated in the discussions, expressed understanding, and voiced agreement with the above plans.  All questions were answered to his satisfaction. he is encouraged to contact clinic should he have any questions or concerns prior to his return  visit.   Follow up plan: - Return in about 4 months (around 05/30/2019) for Follow up with Pre-visit Labs.  Glade Lloyd, MD [facility] [address] [place], [place] [address] Phone: [phone]  Fax: [phone]    01/28/2019, 5:56 PM  This note was partially dictated with voice recognition software. Similar sounding words can be transcribed inadequately or may not  be corrected upon review.

## 2019-01-30 ENCOUNTER — Other Ambulatory Visit: Payer: Self-pay

## 2019-01-30 ENCOUNTER — Encounter: Payer: Self-pay | Admitting: Urology

## 2019-01-30 ENCOUNTER — Other Ambulatory Visit: Payer: Self-pay | Admitting: Urology

## 2019-01-30 ENCOUNTER — Ambulatory Visit (INDEPENDENT_AMBULATORY_CARE_PROVIDER_SITE_OTHER): Payer: Self-pay | Admitting: Urology

## 2019-01-30 VITALS — BP 134/81 | HR 81 | Ht 74.0 in | Wt 242.6 lb

## 2019-01-30 DIAGNOSIS — R351 Nocturia: Secondary | ICD-10-CM

## 2019-01-30 DIAGNOSIS — R8290 Unspecified abnormal findings in urine: Secondary | ICD-10-CM

## 2019-01-30 LAB — URINALYSIS, COMPLETE
Bilirubin, UA: NEGATIVE
Glucose, UA: NEGATIVE
Ketones, UA: NEGATIVE
Leukocytes,UA: NEGATIVE
Nitrite, UA: NEGATIVE
Protein,UA: NEGATIVE
Specific Gravity, UA: 1.01 (ref 1.005–1.030)
Urobilinogen, Ur: 0.2 mg/dL (ref 0.2–1.0)
pH, UA: 6 (ref 5.0–7.5)

## 2019-01-30 LAB — MICROSCOPIC EXAMINATION
Bacteria, UA: NONE SEEN
Epithelial Cells (non renal): NONE SEEN /hpf (ref 0–10)
WBC, UA: NONE SEEN /hpf (ref 0–5)

## 2019-01-30 MED ORDER — CEPHALEXIN 250 MG PO CAPS: 500.0000 mg | ORAL_CAPSULE | Freq: Once | ORAL | Status: DC

## 2019-01-30 NOTE — Patient Instructions (Signed)
Urinalysis Test Why am I having this test? A urinalysis (UA) test may be ordered:  As part of routine wellness screening.  Before surgery.  During pregnancy. You may also need to have this test if you have:  Kidney disease.  Symptoms of a urinary tract infection (UTI).  Diabetes.  Conditions that cause an imbalance in your hormones. What is being tested? A urinalysis is a series of tests done on a sample of your urine. Your kidneys filter your blood to make urine. They get rid of waste products and save the important parts of your blood, such as proteins and minerals (electrolytes). UA is divided into three parts:  A visual exam of your urine sample to check for color or cloudiness.  A dipstick test to check for: ? Proteins. ? Concentration (specific gravity). ? Acidity (pH). ? Glucose. ? Ketones. These are by-products of your body burning fat for energy instead of sugar. ? A waste product from red blood cells (bilirubin). ? A product of white blood cells (leukocyte esterase). ? A product of bacteria (nitrite). ? Blood.  A microscopic exam to check for: ? Red blood cells. ? White blood cells. ? Tube-shaped proteins (hyaline casts). ? Crystal structures. ? Bacteria. ? Epithelial cells. These are cells that line your urinary tract. ? Yeast. You may need more testing if your UA shows too much:  Protein.  Sugar (glucose).  Blood cells.  Bacteria. What kind of sample is taken? A urine sample is required for this test. It is usually collected by passing urine into a clean cup. You may be asked to collect a urine sample first thing in the morning. When collecting a urine sample at home, make sure you:  Use supplies and instructions that you received from the lab.  Collect urine only in the germ-free (sterile) cup that you received from the lab.  Do not let any toilet paper or stool (feces) get into the cup.  Refrigerate the sample until you can return it to the  lab.  Return the sample to the lab as instructed. How do I prepare for this test? Some medicines can affect the results of your UA. Let your health care provider know about all medicines you are taking, including vitamins, supplements, herbs, and over-the-counter medicines. How are the results reported? Some of your test results will be reported as values. Your health care provider will compare your results to normal ranges that were established after testing a large group of people (reference ranges). Reference ranges may vary among different labs and hospitals. For this test, common reference ranges are:  pH: 4.6-8.0 (average, 6.0).  Protein: ? 0-8 mg/dL. ? 50-80 mg/24 hr (at rest). ? Less than 250 mg/24 hr (during exercise).  Specific gravity: ? Adult: 1.005-1.030 (usually, 1.010-1.025). ? Elderly: values decrease with age. ? Newborn: 1.001-1.020.  Nitrites: none.  Ketones: none.  Bilirubin: none.  Urobilinogen: 8.65-7 Ehrlich unit/mL.  Crystals: none.  Casts: none.  Glucose: ? Fresh specimen: none. ? 24-hour specimen: 50-300 mg/24 hr or 0.3-1.7 mmol/day (SI units).  White blood cells (WBCs): 0-4 per low-power field.  WBC casts: none.  Red blood cells (RBCs): Less than or equal to 2.  RBC casts: none.  Epithelial cells: Few or 0-4 per low-power field.  Bacteria: none.  Yeast: none. Other results may be reported based on the appearance and odor of the sample. For this test, normal results are:  Appearance: clear.  Color: amber yellow.  Odor: aromatic. Still other results may  be reported as positive or negative. For this test, normal results are:  Negative for leukocyte esterase. What do the results mean? Many conditions can cause abnormal UA results:  Cloudy urine may be a sign of a UTI.  Acetone odor may indicate a buildup of blood acids in people who have diabetes (diabetic ketoacidosis).  Fecal odor can indicate an abnormal connection (fistula)  between the intestine and the bladder.  Ammonia odor can occur after a person holds urine in the bladder for too long.  Pungent odor may indicate a UTI.  Blood in the urine may be a sign of kidney disease, UTI, or other conditions.  White blood cells may be a sign of a UTI.  Crystals may be a sign of a kidney stone or other kidney disease.  High pH may mean you have a kidney stone, UTI, or kidney disease.  Protein may be a sign of kidney disease, high blood pressure in pregnancy (toxemia), or other conditions.  Glucose may be a sign of diabetes.  Urobilinogen may be a sign of liver disease.  Leukocyte esterase may indicate a UTI.  Nitrites may indicate a UTI. Talk with your health care provider about what your results mean. Questions to ask your health care provider Ask your health care provider, or the department that is doing the test:  When will my results be ready?  How will I get my results?  What are my treatment options?  What other tests do I need?  What are my next steps? Summary  A urinalysis (UA) is a series of tests done on a sample of your urine. The test may be ordered as part of a routine exam, during pregnancy, before surgery, or if you have certain symptoms.  The urinalysis is divided into three parts: a visual exam, a dipstick test, and a microscopic exam.  Your health care provider will compare your results to normal ranges that were established after testing a large group of people (reference ranges).  Talk with your health care provider about what your results mean. This information is not intended to replace advice given to you by your health care provider. Make sure you discuss any questions you have with your health care provider. Document Released: 09/07/2004 Document Revised: 09/19/2017 Document Reviewed: 09/19/2017 Elsevier Interactive Patient Education  2019 [employer].

## 2019-01-30 NOTE — Progress Notes (Signed)
01/30/2019 11:13 AM   Gerald Olson [birth year] [id number]  Referring provider: Antony Blackbird, MD [place], [name] [address]  No chief complaint on file.   HPI: Gerald Olson is a 53 year old he has noc x 3. He had blood on a dip. Repeat UA clear. No gross hematuria. He had gallbladder surgery and changes in his urine. Different color and smell of the urine. He typically voids with a good flow. He takes apple cider vinegar. He snores. No Gu hx or surgery. His PSA was 0.29 Oct 2018.   PVR 3 cc. He is claiming disability because of DDD.   He underwent renal ultrasound Jan 23, 2019. Kidneys appeared normal.  Prostate measured 35 g. Again, he is voiding well but notices a strange urine odor and foaminess on occasion. He asked about protein in the urine. He also returns for cystoscopy.    PMH: Past Medical History:  Diagnosis Date  . Calculus of gallbladder without cholecystitis without obstruction   . Chronic pain    back  . Disc degeneration   . Disc degeneration, lumbar   . Headache     Surgical History: Past Surgical History:  Procedure Laterality Date  . CHOLECYSTECTOMY N/A 06/02/2018   Procedure: LAPAROSCOPIC CHOLECYSTECTOMY;  Surgeon: Vickie Epley, MD;  Location: [facility] ORS;  Service: General;  Laterality: N/A;  . KNEE ARTHROSCOPY     30 Years ago    Home Medications:  Allergies as of 01/30/2019      Reactions   Hydrocodone Itching   Itching, face tingling, dizziness.   Oxycontin [oxycodone Hcl] Itching      Medication List       Accurate as of January 30, 2019 11:13 AM. If you have any questions, ask your nurse or doctor.        Ashwagandha 500 MG Caps Take 1 capsule by mouth 3 (three) times daily.   BLACK CURRANT SEED OIL PO Take by mouth.   Cinnamon 500 MG capsule Take 1,000 mg by mouth 2 (two) times daily.   DHEA PO Take 100 mg by mouth daily.   diclofenac 50 MG tablet Commonly known as:  CATAFLAM Take 1 tablet (50 mg total) by mouth 2  (two) times daily. As needed for pain. Take after eating   diclofenac sodium 1 % Gel Commonly known as:  VOLTAREN Apply 1 application topically 3 (three) times daily as needed (pain).   glipiZIDE 5 MG 24 hr tablet Commonly known as:  GLUCOTROL XL Take 1 tablet (5 mg total) by mouth daily with breakfast.   glucose blood test strip Commonly known as:  True Metrix Blood Glucose Test Use as instructed   metFORMIN 500 MG 24 hr tablet Commonly known as:  Glucophage XR Take 1 tablet (500 mg total) by mouth daily with breakfast.   methocarbamol 500 MG tablet Commonly known as:  ROBAXIN Take 1 tablet (500 mg total) by mouth 2 (two) times daily.   multivitamin with minerals tablet Take 1 tablet by mouth daily.   naproxen sodium 220 MG tablet Commonly known as:  ALEVE Take 660 mg by mouth daily as needed (pain).   ondansetron 4 MG tablet Commonly known as:  Zofran Take 1 tablet (4 mg total) by mouth every 8 (eight) hours as needed for nausea or vomiting.   OVER THE COUNTER MEDICATION Take 1 drop by mouth 2 (two) times daily. CBD Oil   traMADol 50 MG tablet Commonly known as:  ULTRAM Take 1 tablet (50  mg total) by mouth every 12 (twelve) hours as needed for severe pain.   TRUEplus Lancets 28G Misc Use as directed       Allergies:  Allergies  Allergen Reactions  . Hydrocodone Itching    Itching, face tingling, dizziness.  [name] [Oxycodone Hcl] Itching    Family History: Family History  Problem Relation Age of Onset  . Aneurysm Father     Social History:  reports that he has quit smoking. He has never used smokeless tobacco. He reports current alcohol use. He reports that he does not use drugs.  ROS:                                          Blood pressure 134/81, pulse 81, height 6\' 2"  (1.88 m), weight 110 kg. NED. A&Ox3.   No respiratory distress   Abd soft, NT, ND Normal phallus with bilateral descended testicles  Cystoscopy  Procedure Note  Patient identification was confirmed, informed consent was obtained, and patient was prepped using Betadine solution.  Lidocaine jelly was administered per urethral meatus.     Pre-Procedure: - Inspection reveals a normal caliber ureteral meatus.  Procedure: The flexible cystoscope was introduced without difficulty - No urethral strictures/lesions are present. -prostate with mild hyperplasia, lateral lobe hypertrophy -bladder neck normal  - Bilateral ureteral orifices identified - Bladder mucosa  reveals no ulcers, tumors, or lesions - No bladder stones - No trabeculation  Retroflexion shows normal bladder neck    Post-Procedure: - Patient tolerated the procedure well  Assessment/ Plan:  Nocturia, abnormal UA -    No follow-ups on file.  Festus Aloe, MD  Laboratory Data: Lab Results  Component Value Date   WBC 7.3 06/02/2018   HGB 15.3 06/02/2018   HCT 44.9 06/02/2018   MCV 84.5 06/02/2018   PLT 252 06/02/2018    Lab Results  Component Value Date   CREATININE 0.80 01/12/2019    No results found for: PSA  No results found for: TESTOSTERONE  Lab Results  Component Value Date   HGBA1C 8.3 (H) 01/12/2019    Urinalysis    Component Value Date/Time   APPEARANCEUR Clear 10/31/2018 1350   GLUCOSEU Negative 10/31/2018 1350   BILIRUBINUR Negative 10/31/2018 1350   KETONESUR negative 10/06/2018 1455   PROTEINUR Trace (A) 10/31/2018 1350   UROBILINOGEN 0.2 10/06/2018 1455   NITRITE Negative 10/31/2018 1350   LEUKOCYTESUR Negative 10/31/2018 1350    Lab Results  Component Value Date   LABMICR 27.5 08/06/2018   No results found for this or any previous visit. No results found for this or any previous visit. No results found for this or any previous visit. No results found for this or any previous visit. Results for orders placed during the hospital encounter of 01/23/19  US RENAL   Narrative CLINICAL DATA:  53 year old male with  abnormal urinalysis and nocturia  EXAM: RENAL / URINARY TRACT ULTRASOUND COMPLETE  COMPARISON:  None.  FINDINGS: Right Kidney:  Renal measurements: 11.8 x 7.1 x 8.0 cm = volume: 349 mL. Mildly echogenic renal parenchyma with increased conspicuity of the corticomedullary interface. No evidence of hydronephrosis, nephrolithiasis or solid mass.  Left Kidney:  Renal measurements: 12.3 x 7.2 x 7.6 cm = volume: 352 mL. Mildly echogenic renal parenchyma with increased conspicuity of the corticomedullary interface. No evidence of hydronephrosis, nephrolithiasis or solid mass.  Bladder:  Appears  normal for degree of bladder distention. Mildly enlarged prostate gland measuring 4.7 x 3.3 x 4.3 cm.  IMPRESSION: 1. Echogenic renal parenchyma suggests underlying medical renal disease. 2. No evidence of hydronephrosis, nephrolithiasis or solid renal mass. 3. Mildly enlarged prostate gland estimated at 35 g. 4. No discrete bladder abnormality.   Electronically Signed   By: Jacqulynn Cadet M.D.   On: 01/29/2019 09:58    No results found for this or any previous visit. No results found for this or any previous visit. No results found for this or any previous visit.  Assessment & Plan:    Nocturia, abnormal UA - discussed benign cysto and US findings. He was reassured. Will continue to monitor symptoms and UA. We discussed protein in urine is related to medical kidney disease and his PCP might refer him to nephrology if he continues to have proteinuria. We discussed medications, diet and supplements can affect the smell or bubbles in the urine.   No follow-ups on file.  Festus Aloe, MD  [facility] [address] [place], [place] [address] [phone]

## 2019-02-04 ENCOUNTER — Ambulatory Visit: Payer: Self-pay | Admitting: Family Medicine

## 2019-02-06 MED FILL — glipiZIDE XL 5 MG TB24: 5 | 30 days supply | Qty: 30 | Fill #0

## 2019-02-06 MED FILL — METFORMIN HCL ER 500 MG TAB: 500 | 30 days supply | Qty: 30 | Fill #0

## 2019-02-24 ENCOUNTER — Encounter: Payer: Self-pay | Admitting: Nutrition

## 2019-02-24 ENCOUNTER — Other Ambulatory Visit: Payer: Self-pay

## 2019-02-24 ENCOUNTER — Encounter: Payer: Self-pay | Attending: "Endocrinology | Admitting: Nutrition

## 2019-02-24 NOTE — Patient Instructions (Addendum)
   Goals Follow My Plate  Eat meals on times is discussed Eat 2-3 carb choice per meal Drink gallon of water per day Get A1C down to 7% or less.  Cut out alcohol Do not skip meals.

## 2019-02-24 NOTE — Progress Notes (Signed)
  Medical Nutrition Therapy:  Appt start time: 1500 end time:  1600.   Assessment:  Primary concerns today: Diabetes Type 2. Lives by himself. No disc between L5-S1.  Does his own shopping and cooking. Getting food stamps.  Eats 1-2 meals per day. Sleeps in during the day and stays up at night. Doesn' t sleep well.  History of alcohol use.  Metformin 500 ER once a day. Testing blood sugars daily. FBS 140-320 mg/dl. .Not able to exercise. Does some bed yoga to be able to get out of bed. Current diet is inconsistent to meet his needs and control blood sugars.  Preferred Learning Style:   No preference indicated   Learning Readiness  Ready  Change in progress   MEDICATIONS:   DIETARY INTAKE:    24-hr recall:  B ( AM):skips breakfast Snk ( AM):   L ( PM): hasn't eaten yet   Snk ( PM): D ( PM): Chicken with [place] rice and peas, water Snk ( PM):  Beverages: water, 2 beers today  Usual physical activity:ADL   Estimated energy needs: 1800 calories 200 g carbohydrates 135 g protein 50 g fat  Progress Towards Goal(s):  In progress.   Nutritional Diagnosis:  NB-1.1 Food and nutrition-related knowledge deficit As related to Diabetes Type 2.  As evidenced by A1C 8.3%.    Intervention: Nutrition and Diabetes education provided on My Plate, CHO counting, meal planning, portion sizes, timing of meals, avoiding snacks between meals unless having a low blood sugar, target ranges for A1C and blood sugars, signs/symptoms and treatment of hyper/hypoglycemia, monitoring blood sugars, taking medications as prescribed, benefits of exercising 30 minutes per day and prevention of complications of DM.   Goals Follow My Plate  Eat meals on times is discussed Eat 2-3 carb choice per meal Drink gallon of water per day Get A1C down to 7% or less.  Cut out alcohol Do not skip meals  Teaching Method Utilized:  Visual Auditory Hands on  Handouts given during visit  include:   Barriers to learning/adherence to lifestyle change:  Demonstrated degree of understanding via:  Teach Back   Monitoring/Evaluation:  Dietary intake, exercise,  and body weight in 1 month(s).

## 2019-03-09 MED FILL — METFORMIN HCL ER 500 MG TAB: 500 | 30 days supply | Qty: 30 | Fill #1

## 2019-03-09 MED FILL — glipiZIDE XL 5 MG TB24: 5 | 30 days supply | Qty: 30 | Fill #1

## 2019-03-12 LAB — HM DIABETES EYE EXAM

## 2019-03-26 ENCOUNTER — Other Ambulatory Visit: Payer: Self-pay

## 2019-03-26 ENCOUNTER — Encounter: Payer: Medicaid Other | Attending: Family Medicine | Admitting: Nutrition

## 2019-03-26 DIAGNOSIS — K7030 Alcoholic cirrhosis of liver without ascites: Secondary | ICD-10-CM | POA: Insufficient documentation

## 2019-03-26 DIAGNOSIS — I10 Essential (primary) hypertension: Secondary | ICD-10-CM | POA: Insufficient documentation

## 2019-03-26 DIAGNOSIS — F1010 Alcohol abuse, uncomplicated: Secondary | ICD-10-CM | POA: Insufficient documentation

## 2019-03-26 DIAGNOSIS — E1165 Type 2 diabetes mellitus with hyperglycemia: Secondary | ICD-10-CM | POA: Insufficient documentation

## 2019-03-26 NOTE — Progress Notes (Signed)
  Medical Nutrition Therapy:  Appt start time: 1200  End time: 1230   Assessment:  Primary concerns today: Diabetes Type 2. Lives by himself. No disc between L5-S1.  A1C 8.2% Changes made since last visit. Has been working on eating breakfast and more fresh fruits and vegetables. FBS: 100-300's. His blood sugars are up and down. Eating pattern is eratic. Has sleeping problems. History of alcohol use- cirrhosis.Marland Kitchen Has run out of strips.Now has medicaid. Glipizide 5 mg per day, Metformin 500 mg XR in am with breakfast Current diet is inconsistent with CHO causing fluctuations in his blood sugars.  Limited engagement. Not able to exercise due to his back issues.   Preferred Learning Style:   No preference indicated   Learning Readiness  Ready  Change in progress   MEDICATIONS:   DIETARY INTAKE:    24-hr recall:  B ( AM): LF triscuits 6-10,  pb  Snk ( AM):   L ( PM):  1 quart vegetables soup,    Snk ( PM): D ( PM): can vegetables Snk ( PM):  Beverages: water, 2 beers today  Usual physical activity:ADL   Estimated energy needs: 1800 calories 200 g carbohydrates 135 g protein 50 g fat  Progress Towards Goal(s):  In progress.   Nutritional Diagnosis:  NB-1.1 Food and nutrition-related knowledge deficit As related to Diabetes Type 2.  As evidenced by A1C 8.3%.    Intervention: Nutrition and Diabetes education provided on My Plate, CHO counting, meal planning, portion sizes, timing of meals, avoiding snacks between meals unless having a low blood sugar, target ranges for A1C and blood sugars, signs/symptoms and treatment of hyper/hypoglycemia, monitoring blood sugars, taking medications as prescribed, benefits of exercising 30 minutes per day and prevention of complications of DM.   Goals Follow My Plate  Eat meals on times is discussed Eat 2-3 carb choice per meal Drink gallon of water per day Get A1C down to 7% or less.  Cut out alcohol Do not skip meals  Teaching  Method Utilized:  Visual Auditory Hands on  Handouts given during visit include:   Barriers to learning/adherence to lifestyle change:  Demonstrated degree of understanding via:  Teach Back   Monitoring/Evaluation:  Dietary intake, exercise,  and body weight in 3 months.Marland Kitchen

## 2019-04-09 ENCOUNTER — Encounter: Payer: Self-pay | Admitting: Nutrition

## 2019-04-09 NOTE — Patient Instructions (Signed)
   Goals Follow My Plate  Eat meals on times is discussed Eat 2-3 carb choice per meal Drink gallon of water per day Get A1C down to 7% or less.  Cut out alcohol Do not skip meals

## 2019-06-01 ENCOUNTER — Ambulatory Visit: Payer: Medicaid Other | Admitting: Nutrition

## 2019-06-01 ENCOUNTER — Ambulatory Visit: Payer: Self-pay | Admitting: "Endocrinology

## 2019-06-03 ENCOUNTER — Other Ambulatory Visit: Payer: Self-pay | Admitting: Family Medicine

## 2019-06-03 DIAGNOSIS — M5442 Lumbago with sciatica, left side: Secondary | ICD-10-CM

## 2019-06-03 DIAGNOSIS — G8929 Other chronic pain: Secondary | ICD-10-CM

## 2019-06-03 MED FILL — glipiZIDE XL 5 MG TB24: 5 | 30 days supply | Qty: 30 | Fill #2

## 2019-06-03 MED FILL — METFORMIN HCL ER 500 MG TB2: 500 | 30 days supply | Qty: 30 | Fill #2

## 2019-06-03 MED FILL — CELECOXIB 200 MG CAP: 200 | 30 days supply | Qty: 60 | Fill #3

## 2019-06-15 ENCOUNTER — Other Ambulatory Visit: Payer: Self-pay | Admitting: Family Medicine

## 2019-06-15 ENCOUNTER — Other Ambulatory Visit: Payer: Self-pay | Admitting: Pharmacist

## 2019-06-15 DIAGNOSIS — M5442 Lumbago with sciatica, left side: Secondary | ICD-10-CM

## 2019-06-15 DIAGNOSIS — E1165 Type 2 diabetes mellitus with hyperglycemia: Secondary | ICD-10-CM

## 2019-06-15 DIAGNOSIS — G8929 Other chronic pain: Secondary | ICD-10-CM

## 2019-06-15 MED ORDER — ACCU-CHEK GUIDE VI STRP: ORAL_STRIP | 12 refills | Status: DC

## 2019-06-15 MED ORDER — ACCU-CHEK FASTCLIX LANCETS MISC: 11 refills | Status: DC

## 2019-06-15 MED ORDER — ACCU-CHEK GUIDE ME W/DEVICE KIT: 1.0000 | PACK | Freq: Three times a day (TID) | 0 refills | Status: DC

## 2019-06-15 MED FILL — ACCU-CHEK FASTCLIX LANCETS: 34 days supply | Qty: 102 | Fill #0

## 2019-06-15 MED FILL — ACCU-CHEK GUIDE W/DEVICE KI: W/DEVICE | 1 days supply | Qty: 1 | Fill #0

## 2019-06-15 MED FILL — ACCU-CHEK GUIDE TEST STRIP: 33 days supply | Qty: 100 | Fill #0

## 2019-06-26 ENCOUNTER — Ambulatory Visit: Payer: Medicaid Other | Admitting: Family Medicine

## 2019-07-01 ENCOUNTER — Ambulatory Visit: Payer: Medicaid Other | Attending: Family Medicine | Admitting: Physician Assistant

## 2019-07-01 ENCOUNTER — Other Ambulatory Visit: Payer: Self-pay

## 2019-07-01 VITALS — BP 167/108 | HR 110 | Temp 99.3°F | Ht 74.0 in | Wt 248.6 lb

## 2019-07-01 DIAGNOSIS — E1165 Type 2 diabetes mellitus with hyperglycemia: Secondary | ICD-10-CM | POA: Diagnosis not present

## 2019-07-01 DIAGNOSIS — I10 Essential (primary) hypertension: Secondary | ICD-10-CM | POA: Diagnosis not present

## 2019-07-01 DIAGNOSIS — M5441 Lumbago with sciatica, right side: Secondary | ICD-10-CM

## 2019-07-01 DIAGNOSIS — G894 Chronic pain syndrome: Secondary | ICD-10-CM | POA: Diagnosis not present

## 2019-07-01 DIAGNOSIS — M5442 Lumbago with sciatica, left side: Secondary | ICD-10-CM | POA: Diagnosis not present

## 2019-07-01 DIAGNOSIS — Z13220 Encounter for screening for lipoid disorders: Secondary | ICD-10-CM

## 2019-07-01 DIAGNOSIS — M5412 Radiculopathy, cervical region: Secondary | ICD-10-CM

## 2019-07-01 DIAGNOSIS — G8929 Other chronic pain: Secondary | ICD-10-CM | POA: Diagnosis not present

## 2019-07-01 LAB — GLUCOSE, POCT (MANUAL RESULT ENTRY): POC Glucose: 223 mg/dl — AB (ref 70–99)

## 2019-07-01 LAB — POCT GLYCOSYLATED HEMOGLOBIN (HGB A1C): Hemoglobin A1C: 9.1 % — AB (ref 4.0–5.6)

## 2019-07-01 MED ORDER — TRAMADOL HCL 50 MG PO TABS: 50.0000 mg | ORAL_TABLET | Freq: Two times a day (BID) | ORAL | 0 refills | Status: DC | PRN

## 2019-07-01 MED ORDER — METHOCARBAMOL 500 MG PO TABS: 1000.0000 mg | ORAL_TABLET | Freq: Three times a day (TID) | ORAL | 0 refills | Status: DC | PRN

## 2019-07-01 MED ORDER — GLIPIZIDE ER 5 MG PO TB24: 10.0000 mg | ORAL_TABLET | Freq: Every day | ORAL | 3 refills | Status: DC

## 2019-07-01 MED ORDER — METFORMIN HCL ER 500 MG PO TB24: 1000.0000 mg | ORAL_TABLET | Freq: Every day | ORAL | 3 refills | Status: DC

## 2019-07-01 MED ORDER — KETOROLAC TROMETHAMINE 60 MG/2ML IM SOLN
60.0000 mg | Freq: Once | INTRAMUSCULAR | Status: AC
  Administered 2019-07-01: 60 mg via INTRAMUSCULAR

## 2019-07-01 MED ORDER — LISINOPRIL 10 MG PO TABS: 10.0000 mg | ORAL_TABLET | Freq: Every day | ORAL | 3 refills | Status: DC

## 2019-07-01 MED FILL — LISINOPRIL 10 MG TABS: 10 | 30 days supply | Qty: 30 | Fill #0

## 2019-07-01 MED FILL — glipiZIDE 10 MG TABS: 10 | 60 days supply | Qty: 60 | Fill #0

## 2019-07-01 MED FILL — traMADol HCL 50 MG TABS: 50 | 10 days supply | Qty: 20 | Fill #0

## 2019-07-01 MED FILL — METHOCARBAMOL 500 MG TABS: 500 | 15 days supply | Qty: 90 | Fill #0

## 2019-07-01 MED FILL — METFORMIN HCL ER 500 MG TB2: 500 | 30 days supply | Qty: 60 | Fill #0

## 2019-07-01 NOTE — Progress Notes (Signed)
Patient ID: Gerald Olson, male   DOB: 02-Mar-1966, 53 y.o.   MRN: [id number]   Gerald Olson, is a 53 y.o. male  XYI:[id number]  [id number]:[id number]  DOB - 05-28-1966  Subjective:  Chief Complaint and HPI: Gerald Olson is a 53 y.o. male here today for back pain and med RF.  He was out of his diabetes meds all of September and restarted in October.  Chronic pain issues.  Out of tramadol.  Hasn't seen ortho/neurology due to Covid.  Says BP is only high when in pain but upon reviewing BP in Epic, it appears his BP is [facility] high.     ROS:   Constitutional:  No f/c, No night sweats, No unexplained weight loss. EENT:  No vision changes, No blurry vision, No hearing changes. No mouth, throat, or ear problems.  Respiratory: No cough, No SOB Cardiac: No CP, no palpitations GI:  No abd pain, No N/V/D. GU: No Urinary s/sx Musculoskeletal: +back pain Neuro: No headache, no dizziness, no motor weakness.  Skin: No rash Endocrine:  No polydipsia. No polyuria.  Psych: Denies SI/HI  No problems updated.  ALLERGIES: Allergies  Allergen Reactions  . Hydrocodone Itching    Itching, face tingling, dizziness.  [name] [Oxycodone Hcl] Itching    PAST MEDICAL HISTORY: Past Medical History:  Diagnosis Date  . Calculus of gallbladder without cholecystitis without obstruction   . Chronic pain    back  . Disc degeneration   . Disc degeneration, lumbar   . Headache     MEDICATIONS AT HOME: Prior to Admission medications   Medication Sig Start Date End Date Taking? Authorizing Provider  Accu-Chek FastClix Lancets MISC Use as instructed to check blood sugar up to 3 times daily. 06/15/19   Fulp, Cammie, MD  Ashwagandha 500 MG CAPS Take 1 capsule by mouth 3 (three) times daily.    [provider]  BLACK CURRANT SEED OIL PO Take by mouth.    [provider]  Blood Glucose Monitoring Suppl (ACCU-CHEK GUIDE ME) w/Device KIT 1 kit by Does not apply route 3 (three) times daily. 06/15/19    Fulp, Cammie, MD  Cinnamon 500 MG capsule Take 1,000 mg by mouth 2 (two) times daily.    [provider]  diclofenac (CATAFLAM) 50 MG tablet Take 1 tablet (50 mg total) by mouth 2 (two) times daily. As needed for pain. Take after eating 11/28/18   Fulp, Cammie, MD  diclofenac sodium (VOLTAREN) 1 % GEL Apply 1 application topically 3 (three) times daily as needed (pain).    [provider]  glipiZIDE (GLUCOTROL XL) 5 MG 24 hr tablet Take 2 tablets (10 mg total) by mouth daily with breakfast. 07/01/19   Argentina Donovan, PA-C  glucose blood (ACCU-CHEK GUIDE) test strip Use as instructed to check blood sugar up to 3 times daily. 06/15/19   Fulp, Cammie, MD  lisinopril (ZESTRIL) 10 MG tablet Take 1 tablet (10 mg total) by mouth daily. 07/01/19   Argentina Donovan, PA-C  metFORMIN (GLUCOPHAGE XR) 500 MG 24 hr tablet Take 2 tablets (1,000 mg total) by mouth daily with breakfast. 07/01/19   Argentina Donovan, PA-C  methocarbamol (ROBAXIN) 500 MG tablet Take 2 tablets (1,000 mg total) by mouth every 8 (eight) hours as needed for muscle spasms. 07/01/19   Argentina Donovan, PA-C  Multiple Vitamins-Minerals (MULTIVITAMIN WITH MINERALS) tablet Take 1 tablet by mouth daily.    [provider]  naproxen sodium (ALEVE) 220 MG tablet  Take 660 mg by mouth daily as needed (pain).    [provider]  Nutritional Supplements (DHEA PO) Take 100 mg by mouth daily.    [provider]  ondansetron (ZOFRAN) 4 MG tablet Take 1 tablet (4 mg total) by mouth every 8 (eight) hours as needed for nausea or vomiting. 04/30/18   Fulp, Cammie, MD  OVER THE COUNTER MEDICATION Take 1 drop by mouth 2 (two) times daily. CBD Oil    [provider]  traMADol (ULTRAM) 50 MG tablet Take 1 tablet (50 mg total) by mouth every 12 (twelve) hours as needed for severe pain. 10/07/18   Fulp, Cammie, MD     Objective:  EXAM:   Vitals:   07/01/19 1624  BP: (!) 167/108  Pulse: (!) 110  Temp: 99.3  F (37.4 C)  TempSrc: Oral  SpO2: 99%  Weight: 248 lb 9.6 oz (112.8 kg)  Height: '6\' 2"'$  (1.88 m)    General appearance : A&OX3. NAD. Non-toxic-appearing;  Walks with a cane HEENT: Atraumatic and Normocephalic.  PERRLA. EOM intact.   Neck: supple, no JVD. No cervical lymphadenopathy. No thyromegaly Chest/Lungs:  Breathing-non-labored, Good air entry bilaterally, breath sounds normal without rales, rhonchi, or wheezing  CVS: S1 S2 regular, no murmurs, gallops, rubs  Extremities: Bilateral Lower Ext shows no edema, both legs are warm to touch with = pulse throughout Neurology:  CN II-XII grossly intact, Non focal.   Psych:  TP scattered and doesn't answer questions directly. J/I fair. Normal speech. Appropriate eye contact and affect.  Skin:  No Rash  Data Review Lab Results  Component Value Date   HGBA1C 9.1 (A) 07/01/2019   HGBA1C 8.3 (H) 01/12/2019   HGBA1C 8.2 (A) 10/06/2018     Assessment & Plan   1. Uncontrolled type 2 diabetes mellitus with hyperglycemia (HCC) Increase dose of metformin and glipizide - Glucose (CBG) - HgB A1c - metFORMIN (GLUCOPHAGE XR) 500 MG 24 hr tablet; Take 2 tablets (1,000 mg total) by mouth daily with breakfast.  Dispense: 60 tablet; Refill: 3 - glipiZIDE (GLUCOTROL XL) 5 MG 24 hr tablet; Take 2 tablets (10 mg total) by mouth daily with breakfast.  Dispense: 60 tablet; Refill: 3 - Comprehensive metabolic panel  2. Uncontrolled hypertension He says he doesn't have htn, but it appears in reviewing epic that he does.  Today is not just a one-time occurrence.  BUN/CR/GFR ok on last lasbs - lisinopril (ZESTRIL) 10 MG tablet; Take 1 tablet (10 mg total) by mouth daily.  Dispense: 90 tablet; Refill: 3 - Comprehensive metabolic panel  3. Cervical radiculopathy F/up with specialist - ketorolac (TORADOL) injection 60 mg - methocarbamol (ROBAXIN) 500 MG tablet; Take 2 tablets (1,000 mg total) by mouth every 8 (eight) hours as needed for muscle spasms.   Dispense: 90 tablet; Refill: 0  4. Chronic pain disorder F/up with specialist - ketorolac (TORADOL) injection 60 mg - methocarbamol (ROBAXIN) 500 MG tablet; Take 2 tablets (1,000 mg total) by mouth every 8 (eight) hours as needed for muscle spasms.  Dispense: 90 tablet; Refill: 0  5. Screening, lipid - Lipid panel   Patient have been counseled extensively about nutrition and exercise  Return in about 1 month (around 07/31/2019) for see PCP for ongoing concerns; check BP and kidney concerns/memory.  The patient was given clear instructions to go to ER or return to medical center if symptoms don't improve, worsen or new problems develop. The patient verbalized understanding. The patient was told to call to  get lab results if they haven't heard anything in the next week.     Freeman Caldron, PA-C [facility] and [facility] [place], [place]   07/01/2019, 4:44 PM

## 2019-07-01 NOTE — Patient Instructions (Signed)
Check blood pressure out of office 3 times weekly and record and bring to next visit.  Check blood sugars twice daily and record and bring to next visit

## 2019-07-02 ENCOUNTER — Telehealth: Payer: Self-pay | Admitting: *Deleted

## 2019-07-02 LAB — LIPID PANEL
Chol/HDL Ratio: 4.2 ratio (ref 0.0–5.0)
Cholesterol, Total: 138 mg/dL (ref 100–199)
HDL: 33 mg/dL — ABNORMAL LOW (ref >39)
LDL Chol Calc (NIH): 76 mg/dL (ref 0–99)
Triglycerides: 167 mg/dL — ABNORMAL HIGH (ref 0–149)
VLDL Cholesterol Cal: 29 mg/dL (ref 5–40)

## 2019-07-02 LAB — COMPREHENSIVE METABOLIC PANEL
ALT: 57 IU/L — ABNORMAL HIGH (ref 0–44)
AST: 59 IU/L — ABNORMAL HIGH (ref 0–40)
Albumin/Globulin Ratio: 1.6 (ref 1.2–2.2)
Albumin: 4.4 g/dL (ref 3.8–4.9)
Alkaline Phosphatase: 138 IU/L — ABNORMAL HIGH (ref 39–117)
BUN/Creatinine Ratio: 14 (ref 9–20)
BUN: 11 mg/dL (ref 6–24)
Bilirubin Total: 0.3 mg/dL (ref 0.0–1.2)
CO2: 21 mmol/L (ref 20–29)
Calcium: 9.8 mg/dL (ref 8.7–10.2)
Chloride: 100 mmol/L (ref 96–106)
Creatinine, Ser: 0.78 mg/dL (ref 0.76–1.27)
GFR calc Af Amer: 120 mL/min/{1.73_m2} (ref >59)
GFR calc non Af Amer: 104 mL/min/{1.73_m2} (ref >59)
Globulin, Total: 2.8 g/dL (ref 1.5–4.5)
Glucose: 237 mg/dL — ABNORMAL HIGH (ref 65–99)
Potassium: 4.6 mmol/L (ref 3.5–5.2)
Sodium: 137 mmol/L (ref 134–144)
Total Protein: 7.2 g/dL (ref 6.0–8.5)

## 2019-07-02 NOTE — Telephone Encounter (Signed)
Pt name and DOB verified. Patient aware of results and result note per Angela McClung, PA-C  He states he drinks in moderation for pain. Back pain amongst other pains in his body.  He states he drinks a glass of wine and has a beer everyday so that he can eat.   He wanted to know if the other medication he takes increases his liver enzymes  ------   Notes recorded by McClung, Angela M, PA-C on 07/02/2019 at 12:09 PM EST  Please call patient. His blood sugar, LFT, and alkaline phosphatase are all elevated. The alk phos may be exacerbating his pain. Avoid alcohol or products containing tylenol. Drink more water and see PCP in 6 weeks to recheck this. Thanks, Angela McClung, PA-C 

## 2019-07-02 NOTE — Telephone Encounter (Signed)
-----  Message from Argentina Donovan, [place] sent at 07/02/2019 12:09 PM EST ----- Please call patient.  His blood sugar, LFT, and alkaline phosphatase are all elevated.  The alk phos may be exacerbating his pain.  Avoid alcohol or products containing tylenol.  Drink more water and see PCP in 6 weeks to recheck this.  Thanks, Freeman Caldron, PA-C

## 2019-07-03 NOTE — Telephone Encounter (Signed)
Patient aware.

## 2019-07-03 NOTE — Telephone Encounter (Signed)
No, his medications are not as much as a concern as alcohol or tylenol intake.  He can follow up with his PCP for other pain options or referral to pain management.  Thanks, Freeman Caldron, PA-C

## 2019-07-28 ENCOUNTER — Other Ambulatory Visit: Payer: Self-pay

## 2019-07-28 ENCOUNTER — Encounter: Payer: Self-pay | Admitting: Orthopaedic Surgery

## 2019-07-28 ENCOUNTER — Ambulatory Visit (INDEPENDENT_AMBULATORY_CARE_PROVIDER_SITE_OTHER): Payer: Medicaid Other | Admitting: Orthopaedic Surgery

## 2019-07-28 DIAGNOSIS — M545 Low back pain, unspecified: Secondary | ICD-10-CM | POA: Insufficient documentation

## 2019-07-28 NOTE — Progress Notes (Signed)
Office Visit Note   Patient: Gerald Olson           Date of Birth: [birth year]           MRN: [id number] Visit Date: 07/28/2019              Requested by: Antony Blackbird, MD [place],  [place] [address] PCP: Antony Blackbird, MD   Assessment & Plan: Visit Diagnoses:  1. Low back pain, unspecified back pain laterality, unspecified chronicity, unspecified whether sciatica present     Plan: I discussed patient he needs to do better job with his diabetes.  He may need to be on insulin.  He states he is scared of injecting himself with a needle yet he can inject others and has so in the past without problems for intramuscular and subcutaneous injections.  We discussed options that are available now with longer acting insulin etc.  With his A1c at 9.1 is not a candidate for any surgical treatment with her current hospital guidelines.  He understands that those guidelines can only be amended in cases such as acute spine fractures cancer etc.  We discussed the benefits of diabetic improved control helping both those eyesight problems as well as peripheral nerve problems.  Once he gets his A1c down below 7 he can return if he so desires.  Patient states he is eating less only 1 meal a day getting gaining weight.  Follow-Up Instructions: No follow-ups on file.   Orders:  No orders of the defined types were placed in this encounter.  No orders of the defined types were placed in this encounter.     Procedures: No procedures performed   Clinical Data: No additional findings.   Subjective: Chief Complaint  Patient presents with  . Lower Back - Pain, Follow-up  . Neck - Pain    HPI 53 year old male returns.  We discussed with him in the past is slight lumbar spondylolisthesis.  He has had 1 single injection epidural without sustained relief.  He has been instructed to get his diabetes under control so that we had other options for treatment.  Has history of alcoholic  cirrhosis and states he still drinks some 10 of the pain.  He is not working he is applied for disability and he states he only eats 1 meal a day.  He states his sugars have been 109  To 225  CBG  however his last A1c was 9.1.  He states when he takes the Metformin actually makes his sugar go higher rather than lower.  Patient states he has had pain in his neck and has this tremor in his hand that he has to tilt his neck and rotate slightly and suddenly makes the tremor disappear in his left hand.  He states he has some neck pain but is not as severe as his low back pain.  Patient's been on aspirin, tramadol, Celebrex, Robaxin and also Naprosyn.  He was originally referred by [facility]  Review of Systems 14 point systems reviewed updated unchanged.   Objective: Vital Signs: Ht 6\' 2"  (1.88 m)   Wt 244 lb (110.7 kg)   BMI 31.33 kg/m   Physical Exam Constitutional:      Appearance: He is well-developed.  HENT:     Head: Normocephalic and atraumatic.  Eyes:     Pupils: Pupils are equal, round, and reactive to light.  Neck:     Thyroid: No thyromegaly.  Trachea: No tracheal deviation.  Cardiovascular:     Rate and Rhythm: Normal rate.  Pulmonary:     Effort: Pulmonary effort is normal.     Breath sounds: No wheezing.  Abdominal:     General: Bowel sounds are normal.     Palpations: Abdomen is soft.  Skin:    General: Skin is warm and dry.     Capillary Refill: Capillary refill takes less than 2 seconds.  Neurological:     Mental Status: He is alert and oriented to person, place, and time.  Psychiatric:        Behavior: Behavior normal.        Thought Content: Thought content normal.        Judgment: Judgment normal.     Ortho Exam patient moans when he changes position groans.  He has some shaking of his hand with a tremor with contracture of flexor extensors of the same time and then demonstrates rotating his neck and the tremor suddenly stops.   Upper extremity reflexes are intact biceps triceps brachial radialis.  Negative [place]. Specialty Comments:  No specialty comments available.  Imaging: No results found.   PMFS History: Patient Active Problem List   Diagnosis Date Noted  . Low back pain 07/28/2019  . Essential hypertension, benign 01/28/2019  . Alcoholic cirrhosis of liver ([place]) 06/02/2018  . Uncontrolled type 2 diabetes mellitus with hyperglycemia ([place]) 04/25/2018  . Alcohol use disorder, mild, abuse 04/25/2018   Past Medical History:  Diagnosis Date  . Calculus of gallbladder without cholecystitis without obstruction   . Chronic pain    back  . Disc degeneration   . Disc degeneration, lumbar   . Headache     Family History  Problem Relation Age of Onset  . Aneurysm Father     Past Surgical History:  Procedure Laterality Date  . CHOLECYSTECTOMY N/A 06/02/2018   Procedure: LAPAROSCOPIC CHOLECYSTECTOMY;  Surgeon: Vickie Epley, MD;  Location: [facility] ORS;  Service: General;  Laterality: N/A;  . KNEE ARTHROSCOPY     30 Years ago   Social History   Occupational History  . Not on file  Tobacco Use  . Smoking status: Former Research scientist (life sciences)  . Smokeless tobacco: Never Used  Substance and Sexual Activity  . Alcohol use: Yes    Comment: cut back  . Drug use: No  . Sexual activity: Yes

## 2019-07-29 ENCOUNTER — Encounter: Payer: Self-pay | Admitting: Urology

## 2019-07-29 ENCOUNTER — Ambulatory Visit (INDEPENDENT_AMBULATORY_CARE_PROVIDER_SITE_OTHER): Payer: Medicaid Other | Admitting: Urology

## 2019-07-29 VITALS — BP 167/86 | HR 108 | Ht 74.0 in | Wt 244.0 lb

## 2019-07-29 DIAGNOSIS — R3129 Other microscopic hematuria: Secondary | ICD-10-CM | POA: Diagnosis not present

## 2019-07-29 DIAGNOSIS — R8290 Unspecified abnormal findings in urine: Secondary | ICD-10-CM

## 2019-07-29 NOTE — Progress Notes (Signed)
07/29/2019 4:09 PM   Gerald Olson [birth year] [id number]  Referring provider: Antony Blackbird, MD [place],  [place] [address]  No chief complaint on file.   HPI:  Gerald Olson is a 53 year oldhe has noc x 3. He had blood on a dip. Repeat UA clear. No gross hematuria. He had gallbladder surgery and changes in his urine. Different color and smell of the urine. He typically voids with a good flow. He takes apple cider vinegar. He snores. No Gu hx or surgery.His PSA was 0.29 Oct 2018. PVR was 3 ml. He is claiming disability because of DDD.He underwent renal ultrasound Jan 23, 2019. Kidneys appeared normal.  Prostate measured 35 g. Again, he is voiding well but notices a strange urine odor and foaminess on occasion. He asked about protein in the urine. Cystoscopy was benign.   Gerald Olson returns and is doing well. He has clear urine but is smells like "cat pee and melted plastic". No gross hematuria or blood clots. Sometimes foamy. He has DM. Cr was 0.78 Jul 01, 2019. Void with a good flow but occasional slow. He has back surgery coming up as soon as A1c improves. UA today with no protein but 3-10 rbc.    PMH: Past Medical History:  Diagnosis Date  . Calculus of gallbladder without cholecystitis without obstruction   . Chronic pain    back  . Disc degeneration   . Disc degeneration, lumbar   . Headache     Surgical History: Past Surgical History:  Procedure Laterality Date  . CHOLECYSTECTOMY N/A 06/02/2018   Procedure: LAPAROSCOPIC CHOLECYSTECTOMY;  Surgeon: Vickie Epley, MD;  Location: [facility] ORS;  Service: General;  Laterality: N/A;  . KNEE ARTHROSCOPY     30 Years ago    Home Medications:  Allergies as of 07/29/2019      Reactions   Hydrocodone Itching   Itching, face tingling, dizziness.   Oxycontin [oxycodone Hcl] Itching      Medication List       Accurate as of July 29, 2019  4:09 PM. If you have any questions, ask your nurse or doctor.        Accu-Chek  FastClix Lancets Misc Use as instructed to check blood sugar up to 3 times daily.   Accu-Chek Guide Me w/Device Kit 1 kit by Does not apply route 3 (three) times daily.   Accu-Chek Guide test strip Generic drug: glucose blood Use as instructed to check blood sugar up to 3 times daily.   Ashwagandha 500 MG Caps Take 1 capsule by mouth 3 (three) times daily.   BLACK CURRANT SEED OIL PO Take by mouth.   Cinnamon 500 MG capsule Take 1,000 mg by mouth 2 (two) times daily.   DHEA PO Take 100 mg by mouth daily.   diclofenac 50 MG tablet Commonly known as: CATAFLAM Take 1 tablet (50 mg total) by mouth 2 (two) times daily. As needed for pain. Take after eating   diclofenac sodium 1 % Gel Commonly known as: VOLTAREN Apply 1 application topically 3 (three) times daily as needed (pain).   glipiZIDE 5 MG 24 hr tablet Commonly known as: GLUCOTROL XL Take 2 tablets (10 mg total) by mouth daily with breakfast.   lisinopril 10 MG tablet Commonly known as: ZESTRIL Take 1 tablet (10 mg total) by mouth daily.   metFORMIN 500 MG 24 hr tablet Commonly known as: Glucophage XR Take 2 tablets (1,000 mg total) by mouth daily with breakfast.  methocarbamol 500 MG tablet Commonly known as: ROBAXIN Take 2 tablets (1,000 mg total) by mouth every 8 (eight) hours as needed for muscle spasms.   multivitamin with minerals tablet Take 1 tablet by mouth daily.   naproxen sodium 220 MG tablet Commonly known as: ALEVE Take 660 mg by mouth daily as needed (pain).   ondansetron 4 MG tablet Commonly known as: Zofran Take 1 tablet (4 mg total) by mouth every 8 (eight) hours as needed for nausea or vomiting.   OVER THE COUNTER MEDICATION Take 1 drop by mouth 2 (two) times daily. CBD Oil   traMADol 50 MG tablet Commonly known as: ULTRAM Take 1 tablet (50 mg total) by mouth every 12 (twelve) hours as needed for severe pain.       Allergies:  Allergies  Allergen Reactions  . Hydrocodone  Itching    Itching, face tingling, dizziness.  [name] [Oxycodone Hcl] Itching    Family History: Family History  Problem Relation Age of Onset  . Aneurysm Father     Social History:  reports that he has quit smoking. He has never used smokeless tobacco. He reports current alcohol use. He reports that he does not use drugs.  ROS:                                        Physical Exam: There were no vitals taken for this visit.  Constitutional:  Alert and oriented, No acute distress. HEENT: [place] AT, moist mucus membranes.  Trachea midline, no masses. Cardiovascular: No clubbing, cyanosis, or edema. Respiratory: Normal respiratory effort, no increased work of breathing. GI: Abdomen is soft, nontender, nondistended, no abdominal masses GU: No CVA tenderness Skin: No rashes, bruises or suspicious lesions. Neurologic: Grossly intact, no focal deficits, moving all 4 extremities. Walks with a cane.  Psychiatric: Normal mood and affect.  Laboratory Data: Lab Results  Component Value Date   WBC 7.3 06/02/2018   HGB 15.3 06/02/2018   HCT 44.9 06/02/2018   MCV 84.5 06/02/2018   PLT 252 06/02/2018    Lab Results  Component Value Date   CREATININE 0.78 07/01/2019    No results found for: PSA  No results found for: TESTOSTERONE  Lab Results  Component Value Date   HGBA1C 9.1 (A) 07/01/2019    Urinalysis    Component Value Date/Time   APPEARANCEUR Clear 01/30/2019 1141   GLUCOSEU Negative 01/30/2019 1141   BILIRUBINUR Negative 01/30/2019 1141   KETONESUR negative 10/06/2018 1455   PROTEINUR Negative 01/30/2019 1141   UROBILINOGEN 0.2 10/06/2018 1455   NITRITE Negative 01/30/2019 1141   LEUKOCYTESUR Negative 01/30/2019 1141    Lab Results  Component Value Date   LABMICR See below: 01/30/2019   WBCUA None seen 01/30/2019   LABEPIT None seen 01/30/2019   BACTERIA None seen 01/30/2019    Pertinent Imaging: Na/a No results found for this  or any previous visit. No results found for this or any previous visit. No results found for this or any previous visit. No results found for this or any previous visit. Results for orders placed during the hospital encounter of 01/23/19  US RENAL   Narrative CLINICAL DATA:  53 year old male with abnormal urinalysis and nocturia  EXAM: RENAL / URINARY TRACT ULTRASOUND COMPLETE  COMPARISON:  None.  FINDINGS: Right Kidney:  Renal measurements: 11.8 x 7.1 x 8.0 cm = volume: 349 mL. Mildly echogenic  renal parenchyma with increased conspicuity of the corticomedullary interface. No evidence of hydronephrosis, nephrolithiasis or solid mass.  Left Kidney:  Renal measurements: 12.3 x 7.2 x 7.6 cm = volume: 352 mL. Mildly echogenic renal parenchyma with increased conspicuity of the corticomedullary interface. No evidence of hydronephrosis, nephrolithiasis or solid mass.  Bladder:  Appears normal for degree of bladder distention. Mildly enlarged prostate gland measuring 4.7 x 3.3 x 4.3 cm.  IMPRESSION: 1. Echogenic renal parenchyma suggests underlying medical renal disease. 2. No evidence of hydronephrosis, nephrolithiasis or solid renal mass. 3. Mildly enlarged prostate gland estimated at 35 g. 4. No discrete bladder abnormality.   Electronically Signed   By: Jacqulynn Cadet M.D.   On: 01/29/2019 09:58    No results found for this or any previous visit. No results found for this or any previous visit. No results found for this or any previous visit.  Assessment & Plan:    MH - recent US and cysto normal earlier this year. No gross hematuria. Will continue to monitor symptoms, exam and UA. See in 6 mo to consider further eval.   No follow-ups on file.  Festus Aloe, MD  [facility] [address], [place], [place] [address] [phone]

## 2019-07-30 LAB — MICROSCOPIC EXAMINATION
Bacteria, UA: NONE SEEN
Epithelial Cells (non renal): NONE SEEN /hpf (ref 0–10)

## 2019-07-30 LAB — URINALYSIS, COMPLETE
Bilirubin, UA: NEGATIVE
Ketones, UA: NEGATIVE
Leukocytes,UA: NEGATIVE
Nitrite, UA: NEGATIVE
Protein,UA: NEGATIVE
Specific Gravity, UA: 1.025 (ref 1.005–1.030)
Urobilinogen, Ur: 0.2 mg/dL (ref 0.2–1.0)
pH, UA: 6 (ref 5.0–7.5)

## 2019-07-31 ENCOUNTER — Ambulatory Visit: Payer: Medicaid Other | Attending: Family Medicine | Admitting: Family Medicine

## 2019-07-31 ENCOUNTER — Other Ambulatory Visit: Payer: Self-pay

## 2019-07-31 ENCOUNTER — Encounter: Payer: Self-pay | Admitting: Family Medicine

## 2019-07-31 VITALS — BP 158/88 | HR 110 | Temp 98.2°F

## 2019-07-31 DIAGNOSIS — F1010 Alcohol abuse, uncomplicated: Secondary | ICD-10-CM

## 2019-07-31 DIAGNOSIS — R748 Abnormal levels of other serum enzymes: Secondary | ICD-10-CM

## 2019-07-31 DIAGNOSIS — I10 Essential (primary) hypertension: Secondary | ICD-10-CM

## 2019-07-31 DIAGNOSIS — R413 Other amnesia: Secondary | ICD-10-CM | POA: Diagnosis not present

## 2019-07-31 DIAGNOSIS — E1165 Type 2 diabetes mellitus with hyperglycemia: Secondary | ICD-10-CM

## 2019-07-31 DIAGNOSIS — M5442 Lumbago with sciatica, left side: Secondary | ICD-10-CM | POA: Diagnosis not present

## 2019-07-31 DIAGNOSIS — M5441 Lumbago with sciatica, right side: Secondary | ICD-10-CM

## 2019-07-31 DIAGNOSIS — G8929 Other chronic pain: Secondary | ICD-10-CM

## 2019-07-31 MED ORDER — LISINOPRIL 20 MG PO TABS: 20.0000 mg | ORAL_TABLET | Freq: Every day | ORAL | 1 refills | Status: DC

## 2019-07-31 NOTE — Progress Notes (Signed)
Virtual Visit via Telephone Note  I connected with Gerald Olson on 07/31/19 at  4:10 PM EST by telephone and verified that I am speaking with the correct person using two identifiers.   I discussed the limitations, risks, security and privacy concerns of performing an evaluation and management service by telephone and the availability of in person appointments. I also discussed with the patient that there may be a patient responsible charge related to this service. The patient expressed understanding and agreed to proceed.  Patient Location: Home Provider Location: [facility] Others participating in call:    History of Present Illness:       53 yo male who is seen in follow-up of type 2 diabetes, hypertension and chronic low back pain. He reports that earlier this week he saw Dr. [name] in Orthopedics and also saw Neurology. He reports that Dr. [name] told him that some of his pain might be due to his diabetes. He is also interested in stopping the use of metformin. He complains of continued issues with his memory in which he will be talking and will then forget what he was going to say and then if he is reminded of what he last said then he can continue. Patient reports that he does get some improvement in spasm with use of Robaxin but it does not help as well as the flexeril that he gets from his mother. Flexeril knocks him out and he is able to sleep for about 8-10 hours; otherwise he sleeps for about 2 hours then is up for a few hours until he can fall back asleep. He has some issues with abdominal pain-abdomen feels sore about an hour taking the metformin. He also feels like the metformin may not be working as well as it did in the past.  He reports that he has been told that he will need to get better control of his diabetes with A1c closer to 7 or less in order to have any surgical intervention regarding his back pain.         He also reports that his home blood pressures continue to remain  elevated usually above 140/80-90.  He believes that some of his blood pressure elevation is related to his chronic pain issues.  He states that his back pain ranges anywhere from a 6 to greater than 10 on a 0-to-10 scale.  Back pain is made worse with movements or activity.  He states that his mother wants him to do activities such as mowing/taking care of household chores and he reports that this causes him to have increased back pain and he can only do activities for short while before having to stop and rest or take medication to help with the pain.            He continues to follow-up with gastroenterology regarding his elevated liver enzymes.  He reports that he still drinks 1-2 beers before dinner to help with his appetite and sometimes drinks 1 or 2 beers before bedtime to help with sleep.  He states that he does not consider this amount of alcohol to be significant and he does not believe that this is affecting his liver enzymes.  He did drink more in the past but states that he has decreased his alcohol use due to his issues with elevated liver enzymes.  He has some occasional mid upper abdominal pain.  Occasional nausea.  He denies any blood in the stool, no black stools.  He denies  any recent chest pain or palpitations.  He has occasional mild shortness of breath with exertion.  No current cough.  No fever or chills, no issues with peripheral edema at this time.                  Past Medical History:  Diagnosis Date  . Calculus of gallbladder without cholecystitis without obstruction   . Chronic pain    back  . Disc degeneration   . Disc degeneration, lumbar   . Headache     Past Surgical History:  Procedure Laterality Date  . CHOLECYSTECTOMY N/A 06/02/2018   Procedure: LAPAROSCOPIC CHOLECYSTECTOMY;  Surgeon: Vickie Epley, MD;  Location: [facility] ORS;  Service: General;  Laterality: N/A;  . KNEE ARTHROSCOPY     30 Years ago    Family History  Problem Relation Age of Onset  .  Aneurysm Father     Social History   Tobacco Use  . Smoking status: Former Research scientist (life sciences)  . Smokeless tobacco: Never Used  Substance Use Topics  . Alcohol use: Yes    Comment: cut back  . Drug use: No     Allergies  Allergen Reactions  . Hydrocodone Itching    Itching, face tingling, dizziness.  [name] [Oxycodone Hcl] Itching       Observations/Objective: No vital signs or physical exam conducted as visit was done via telephone  Assessment and Plan: 1. Uncontrolled type 2 diabetes mellitus with hyperglycemia ([place]) He reports that his blood sugars continue to remain poorly controlled.  He has been asked to come into the office to have follow-up with the clinical pharmacist for adjustment of medications or addition of medicines to help with improved control of his blood sugars.  He reports that he has met with orthopedics but cannot have any type of surgical intervention until his blood sugars are better controlled with hemoglobin A1c closer to 7.  He has also been asked to have comprehensive metabolic panel as well as lipase done in follow-up of diabetes and use of medications for treatment of diabetes.  He is also being referred to endocrinology for further assistance with improving control of his blood sugars. - Amb Referral to Clinical Pharmacist - Comprehensive metabolic panel; Future - Lipase; Future - Ambulatory referral to Endocrinology  2. Chronic bilateral low back pain with bilateral sciatica Patient reports continued chronic issues with low back pain which interferes with everyday activities as he has difficulty with standing, sitting or lying down for prolonged periods.  He has interruption of sleep due to his chronic back pain.  He is to continue follow-up with orthopedics regarding possible surgery as well as being referred to endocrinology for help with controlling blood sugars so that he will be a surgical candidate.  3. Memory impairment Patient is requesting  neuropsychologic testing as he feels that he is having issues with memory and cognitive decline  4. Elevated liver enzymes 5. Alcohol use disorder, mild, abuse Patient has still not fully stop drinking alcohol as he reports that he drinks a few beers before bedtime to relax and help him sleep.  He is to continue follow-up with gastroenterology regarding elevated liver enzymes and patient will have lipase at upcoming lab visit. - Lipase; Future  6. Uncontrolled hypertension Home blood pressures have been high and his lisinopril will be increased from 10 mg to 20 mg and he has been asked to bring his home monitor with him when he comes to the visit. - lisinopril (ZESTRIL) 20  MG tablet; Take 1 tablet (20 mg total) by mouth daily.  Dispense: 90 tablet; Refill: 1   Follow Up Instructions:Return for DM/HTN; 2-3 weeks with Lurena Joiner and labs.    I discussed the assessment and treatment plan with the patient. The patient was provided an opportunity to ask questions and all were answered. The patient agreed with the plan and demonstrated an understanding of the instructions.   The patient was advised to call back or seek an in-person evaluation if the symptoms worsen or if the condition fails to improve as anticipated.  I provided  45 minutes of non-face-to-face time during this encounter.   Antony Blackbird, MD

## 2019-07-31 NOTE — Progress Notes (Signed)
Patient verified DOB Patient has eaten today. Patient has taken medication today. Patient complains of chronic back pain increasing over the past month. Patient feels it in the middle of his back burning pain. Patient would like an alternative for metformin.

## 2019-08-06 ENCOUNTER — Ambulatory Visit: Payer: Medicaid Other | Attending: Family Medicine | Admitting: Pharmacist

## 2019-08-06 ENCOUNTER — Other Ambulatory Visit: Payer: Self-pay

## 2019-08-06 DIAGNOSIS — Z7984 Long term (current) use of oral hypoglycemic drugs: Secondary | ICD-10-CM | POA: Insufficient documentation

## 2019-08-06 DIAGNOSIS — Z79899 Other long term (current) drug therapy: Secondary | ICD-10-CM | POA: Diagnosis not present

## 2019-08-06 DIAGNOSIS — M549 Dorsalgia, unspecified: Secondary | ICD-10-CM | POA: Diagnosis not present

## 2019-08-06 DIAGNOSIS — E1165 Type 2 diabetes mellitus with hyperglycemia: Secondary | ICD-10-CM | POA: Diagnosis not present

## 2019-08-06 DIAGNOSIS — G8929 Other chronic pain: Secondary | ICD-10-CM | POA: Diagnosis not present

## 2019-08-06 DIAGNOSIS — Z7901 Long term (current) use of anticoagulants: Secondary | ICD-10-CM | POA: Insufficient documentation

## 2019-08-06 DIAGNOSIS — Z87891 Personal history of nicotine dependence: Secondary | ICD-10-CM | POA: Diagnosis not present

## 2019-08-06 LAB — GLUCOSE, POCT (MANUAL RESULT ENTRY): POC Glucose: 270 mg/dL — AB (ref 70–99)

## 2019-08-06 MED ORDER — TRULICITY 0.75 MG/0.5ML ~~LOC~~ SOAJ: 0.7500 mg | SUBCUTANEOUS | 1 refills | Status: DC

## 2019-08-06 MED ORDER — METFORMIN HCL ER 500 MG PO TB24: 1000.0000 mg | ORAL_TABLET | Freq: Two times a day (BID) | ORAL | 2 refills | Status: DC

## 2019-08-06 MED FILL — METFORMIN HCL ER 500 MG TB2: 500 | 30 days supply | Qty: 120 | Fill #0

## 2019-08-06 MED FILL — LISINOPRIL 10 MG TABS: 10 | 30 days supply | Qty: 30 | Fill #1

## 2019-08-06 NOTE — Patient Instructions (Signed)
Thank you for coming to see me today. Please do the following:  1. Stop glipizide.  2. Increase metformin to 2 tablets in the morning and 2 tablets in the evening. 3. Start Trulicity A999333 mg weekly.   4. Continue checking blood sugars at home. 5. Continue making the lifestyle changes we've discussed together during our visit. Diet and exercise play a significant role in improving your blood sugars.  6. Follow-up with me in 1 month.   Hypoglycemia or low blood sugar:   Low blood sugar can happen quickly and may become an emergency if not treated right away.   While this shouldn't happen often, it can be brought upon if you skip a meal or do not eat enough. Also, if your insulin or other diabetes medications are dosed too high, this can cause your blood sugar to go to low.   Warning signs of low blood sugar include: 1. Feeling shaky or dizzy 2. Feeling weak or tired  3. Excessive hunger 4. Feeling anxious or upset  5. Sweating even when you aren't exercising  What to do if I experience low blood sugar? 1. Check your blood sugar with your meter. If lower than 70, proceed to step 2.  2. Treat with 3-4 glucose tablets or 3 packets of regular sugar. If these aren't around, you can try hard candy. Yet another option would be to drink 4 ounces of fruit juice or 6 ounces of REGULAR soda.  3. Re-check your sugar in 15 minutes. If it is still below 70, do what you did in step 2 again. If has come back up, go ahead and eat a snack or small meal at this time.

## 2019-08-06 NOTE — Progress Notes (Signed)
    S:    PCP: Dr. Chapman Fitch  No chief complaint on file.  Patient arrives in good spirits.  Presents for diabetes evaluation, education, and management Patient was referred and last seen by Primary Care Provider on 07/31/19.    Family/Social History:  - FHx: aneurysm (father) - Tobacco: former smoke - Alcohol: occasional use   Insurance coverage/medication affordability: [place] Medicaid  Patient reports adherence with medications.  Current diabetes medications include: glipizide 10 mg XL daily, metformin XR 1000 mg daily with breakfast Current hypertension medications include: lisinopril 20 mg daily  Patient denies hypoglycemic events.  Patient reported dietary habits:  - Limited to what he can get using EBT  Patient-reported exercise habits:  - Limited d/t chronic back pain   Patient denies nocturia (nighttime urination).  Patient reports neuropathy (nerve pain). Patient denies visual changes. Patient reports self foot exams.     O:  POCT: 270  Lab Results  Component Value Date   HGBA1C 9.1 (A) 07/01/2019   There were no vitals filed for this visit.  Lipid Panel     Component Value Date/Time   CHOL 138 07/01/2019 1655   TRIG 167 (H) 07/01/2019 1655   HDL 33 (L) 07/01/2019 1655   CHOLHDL 4.2 07/01/2019 1655   LDLCALC 76 07/01/2019 1655   Home CBGs: 7 day avg: 275 14 day average: 232 30 day average: 210  A/P: Diabetes longstanding currently uncontrolled. Patient is able to verbalize appropriate hypoglycemia management plan. Patient is adherent with medication. Control is suboptimal due to dietary indiscretion and sedentary lifestyle.  -Increase metformin to 1000 mg XR BID. -Stop glipizide.  -Start Trulicity A999333 mg weekly.   -Extensively discussed pathophysiology of diabetes, recommended lifestyle interventions, dietary effects on blood sugar control -Counseled on s/sx of and management of hypoglycemia -Next A1C anticipated 09/2019.   Written patient  instructions provided.  Total time in face to face counseling 15 minutes.   Follow up Pharmacist Clinic Visit in 1 month.   Benard Halsted, PharmD, [place] [phone]

## 2019-08-07 ENCOUNTER — Telehealth: Payer: Self-pay

## 2019-08-07 NOTE — Telephone Encounter (Signed)
Pt's Medicaid ins prefers Victoza.  If appropriate, can you change therapy from Trulicity to Victoza.

## 2019-08-10 MED ORDER — VICTOZA 18 MG/3ML ~~LOC~~ SOPN: PEN_INJECTOR | SUBCUTANEOUS | 2 refills | Status: DC

## 2019-08-10 NOTE — Telephone Encounter (Signed)
Rx sent 

## 2019-08-10 NOTE — Telephone Encounter (Signed)
If you are available, can you send in order for change from Trulicity to Victoza as per insurance preference

## 2019-08-11 MED FILL — VICTOZA 2-PAK 18 MG/3 ML PE: 18 | 32 days supply | Qty: 6 | Fill #0

## 2019-08-17 ENCOUNTER — Other Ambulatory Visit: Payer: Self-pay | Admitting: Pharmacist

## 2019-08-17 DIAGNOSIS — E1165 Type 2 diabetes mellitus with hyperglycemia: Secondary | ICD-10-CM

## 2019-08-17 MED ORDER — TRUEPLUS 5-BEVEL PEN NEEDLES 32G X 4 MM MISC: 11 refills | Status: DC

## 2019-08-17 MED FILL — TRUEPLUS PEN NDL 32GX5/32": 32G X 4 MM | 90 days supply | Qty: 100 | Fill #0

## 2019-08-17 MED FILL — TRUEPLUS PEN NDL 32GX5/32: 32G X 4 MM | 90 days supply | Qty: 100 | Fill #0

## 2019-08-17 MED FILL — ACCU-CHEK GUIDE TEST STRIP: 33 days supply | Qty: 100 | Fill #1

## 2019-09-07 ENCOUNTER — Other Ambulatory Visit: Payer: Self-pay

## 2019-09-07 ENCOUNTER — Ambulatory Visit: Payer: Medicaid Other | Attending: Family Medicine | Admitting: Pharmacist

## 2019-09-07 ENCOUNTER — Encounter: Payer: Self-pay | Admitting: Pharmacist

## 2019-09-07 DIAGNOSIS — E1165 Type 2 diabetes mellitus with hyperglycemia: Secondary | ICD-10-CM | POA: Diagnosis not present

## 2019-09-07 DIAGNOSIS — I10 Essential (primary) hypertension: Secondary | ICD-10-CM | POA: Diagnosis not present

## 2019-09-07 DIAGNOSIS — Z87891 Personal history of nicotine dependence: Secondary | ICD-10-CM | POA: Diagnosis not present

## 2019-09-07 DIAGNOSIS — Z8249 Family history of ischemic heart disease and other diseases of the circulatory system: Secondary | ICD-10-CM | POA: Insufficient documentation

## 2019-09-07 DIAGNOSIS — Z79899 Other long term (current) drug therapy: Secondary | ICD-10-CM | POA: Diagnosis not present

## 2019-09-07 DIAGNOSIS — E11649 Type 2 diabetes mellitus with hypoglycemia without coma: Secondary | ICD-10-CM | POA: Diagnosis not present

## 2019-09-07 DIAGNOSIS — Z7984 Long term (current) use of oral hypoglycemic drugs: Secondary | ICD-10-CM | POA: Insufficient documentation

## 2019-09-07 NOTE — Progress Notes (Signed)
    S:    PCP: Dr. Chapman Fitch  No chief complaint on file.  Patient arrives in good spirits.  Presents for diabetes evaluation, education, and management Patient was referred and last seen by Primary Care Provider on 07/31/19.  I saw him on [id number] and started Trulicity. However, switched to Victoza as preferred by insurance.   Family/Social History:  - FHx: aneurysm (father) - Tobacco: former smoke - Alcohol: occasional use   Insurance coverage/medication affordability: [place] [employer]  Patient denies adherence with medications.  Current diabetes medications include: glipizide 10 mg XL daily, metformin XR 1000 mg BID, Victoza 1.8 mg daily (reports taking 1.8 mg weekly).  Current hypertension medications include: lisinopril 20 mg daily  Patient denies hypoglycemic events.  Patient reported dietary habits:  - Limited to what he can get using EBT  Patient-reported exercise habits:  - Limited d/t chronic back pain   Patient denies nocturia (nighttime urination).  Patient reports neuropathy (nerve pain). Patient denies visual changes. Patient reports self foot exams.     O:   Lab Results  Component Value Date   HGBA1C 9.1 (A) 07/01/2019   There were no vitals filed for this visit.  Lipid Panel     Component Value Date/Time   CHOL 138 07/01/2019 1655   TRIG 167 (H) 07/01/2019 1655   HDL 33 (L) 07/01/2019 1655   CHOLHDL 4.2 07/01/2019 1655   LDLCALC 76 07/01/2019 1655   Home CBGs: Fasting: 147 - low 200s  A/P: Diabetes longstanding currently uncontrolled. Patient is able to verbalize appropriate hypoglycemia management plan. Patient is not adherent with medication. Control is suboptimal due to dietary indiscretion and sedentary lifestyle.   He is tolerating metformin BID. Unfortunately, he is using his Victoza weekly. Counseling given. Pt denies NV or abdominal pain.   -Continue metformin 1000 mg XR BID. -Start Victoza 1.2 mg daily x1 week, then increase to 1.8 mg daily.   -Extensively discussed pathophysiology of diabetes, recommended lifestyle interventions, dietary effects on blood sugar control -Counseled on s/sx of and management of hypoglycemia -Next A1C anticipated 09/2019.   Written patient instructions provided.  Total time in face to face counseling 15 minutes.   Follow up PCP Visit 09/10/2019.  Benard Halsted, PharmD, [place] [phone]

## 2019-09-10 ENCOUNTER — Encounter: Payer: Self-pay | Admitting: Family Medicine

## 2019-09-10 ENCOUNTER — Other Ambulatory Visit: Payer: Self-pay

## 2019-09-10 ENCOUNTER — Ambulatory Visit: Payer: Medicaid Other | Attending: Family Medicine | Admitting: Family Medicine

## 2019-09-10 VITALS — BP 148/97 | HR 100 | Temp 98.8°F | Ht 74.0 in | Wt 244.0 lb

## 2019-09-10 DIAGNOSIS — Z87891 Personal history of nicotine dependence: Secondary | ICD-10-CM | POA: Diagnosis not present

## 2019-09-10 DIAGNOSIS — E1142 Type 2 diabetes mellitus with diabetic polyneuropathy: Secondary | ICD-10-CM | POA: Insufficient documentation

## 2019-09-10 DIAGNOSIS — Z79899 Other long term (current) drug therapy: Secondary | ICD-10-CM

## 2019-09-10 DIAGNOSIS — Z791 Long term (current) use of non-steroidal anti-inflammatories (NSAID): Secondary | ICD-10-CM | POA: Diagnosis not present

## 2019-09-10 DIAGNOSIS — I10 Essential (primary) hypertension: Secondary | ICD-10-CM | POA: Diagnosis not present

## 2019-09-10 DIAGNOSIS — G8929 Other chronic pain: Secondary | ICD-10-CM

## 2019-09-10 DIAGNOSIS — G894 Chronic pain syndrome: Secondary | ICD-10-CM

## 2019-09-10 DIAGNOSIS — M5441 Lumbago with sciatica, right side: Secondary | ICD-10-CM

## 2019-09-10 DIAGNOSIS — E1165 Type 2 diabetes mellitus with hyperglycemia: Secondary | ICD-10-CM | POA: Diagnosis not present

## 2019-09-10 DIAGNOSIS — M5442 Lumbago with sciatica, left side: Secondary | ICD-10-CM | POA: Diagnosis not present

## 2019-09-10 DIAGNOSIS — G629 Polyneuropathy, unspecified: Secondary | ICD-10-CM

## 2019-09-10 DIAGNOSIS — Z794 Long term (current) use of insulin: Secondary | ICD-10-CM | POA: Insufficient documentation

## 2019-09-10 LAB — GLUCOSE, POCT (MANUAL RESULT ENTRY): POC Glucose: 165 mg/dL — AB (ref 70–99)

## 2019-09-10 MED ORDER — GABAPENTIN 100 MG PO CAPS: ORAL_CAPSULE | ORAL | 4 refills | Status: DC

## 2019-09-10 MED ORDER — TRAMADOL HCL 50 MG PO TABS: 50.0000 mg | ORAL_TABLET | Freq: Two times a day (BID) | ORAL | 1 refills | Status: AC | PRN

## 2019-09-10 MED FILL — GABAPENTIN 100 MG CAP: 100 | 35 days supply | Qty: 120 | Fill #0

## 2019-09-10 MED FILL — traMADol HCL 50 MG TABS: 50 | 7 days supply | Qty: 14 | Fill #0

## 2019-09-10 NOTE — Progress Notes (Signed)
Pt. Is here for a follow up.  Pt. Is requesting referral to Orthopedic.

## 2019-09-10 NOTE — Patient Instructions (Signed)
Neuropathic Pain Neuropathic pain is pain caused by damage to the nerves that are responsible for certain sensations in your body (sensory nerves). The pain can be caused by:  Damage to the sensory nerves that send signals to your spinal cord and brain (peripheral nervous system).  Damage to the sensory nerves in your brain or spinal cord (central nervous system). Neuropathic pain can make you more sensitive to pain. Even a minor sensation can feel very painful. This is usually a long-term condition that can be difficult to treat. The type of pain differs from person to person. It may:  Start suddenly (acute), or it may develop slowly and last for a long time (chronic).  Come and go as damaged nerves heal, or it may stay at the same level for years.  Cause emotional distress, loss of sleep, and a lower quality of life. What are the causes? The most common cause of this condition is diabetes. Many other diseases and conditions can also cause neuropathic pain. Causes of neuropathic pain can be classified as:  Toxic. This is caused by medicines and chemicals. The most common cause of toxic neuropathic pain is damage from cancer treatments (chemotherapy).  Metabolic. This can be caused by: ? Diabetes. This is the most common disease that damages the nerves. ? Lack of vitamin B from long-term alcohol abuse.  Traumatic. Any injury that cuts, crushes, or stretches a nerve can cause damage and pain. A common example is feeling pain after losing an arm or leg (phantom limb pain).  Compression-related. If a sensory nerve gets trapped or compressed for a long period of time, the blood supply to the nerve can be cut off.  Vascular. Many blood vessel diseases can cause neuropathic pain by decreasing blood supply and oxygen to nerves.  Autoimmune. This type of pain results from diseases in which the body's defense system (immune system) mistakenly attacks sensory nerves. Examples of autoimmune diseases  that can cause neuropathic pain include lupus and multiple sclerosis.  Infectious. Many types of viral infections can damage sensory nerves and cause pain. Shingles infection is a common cause of this type of pain.  Inherited. Neuropathic pain can be a symptom of many diseases that are passed down through families (genetic). What increases the risk? You are more likely to develop this condition if:  You have diabetes.  You smoke.  You drink too much alcohol.  You are taking certain medicines, including medicines that kill cancer cells (chemotherapy) or that treat immune system disorders. What are the signs or symptoms? The main symptom is pain. Neuropathic pain is often described as:  Burning.  Shock-like.  Stinging.  Hot or cold.  Itching. How is this diagnosed? No single test can diagnose neuropathic pain. It is diagnosed based on:  Physical exam and your symptoms. Your health care provider will ask you about your pain. You may be asked to use a pain scale to describe how bad your pain is.  Tests. These may be done to see if you have a high sensitivity to pain and to help find the cause and location of any sensory nerve damage. They include: ? Nerve conduction studies to test how well nerve signals travel through your sensory nerves (electrodiagnostic testing). ? Stimulating your sensory nerves through electrodes on your skin and measuring the response in your spinal cord and brain (somatosensory evoked potential).  Imaging studies, such as: ? X-rays. ? CT scan. ? MRI. How is this treated? Treatment for neuropathic pain may change   over time. You may need to try different treatment options or a combination of treatments. Some options include:  Treating the underlying cause of the neuropathy, such as diabetes, kidney disease, or vitamin deficiencies.  Stopping medicines that can cause neuropathy, such as chemotherapy.  Medicine to relieve pain. Medicines may  include: ? Prescription or over-the-counter pain medicine. ? Anti-seizure medicine. ? Antidepressant medicines. ? Pain-relieving patches that are applied to painful areas of skin. ? A medicine to numb the area (local anesthetic), which can be injected as a nerve block.  Transcutaneous nerve stimulation. This uses electrical currents to block painful nerve signals. The treatment is painless.  Alternative treatments, such as: ? Acupuncture. ? Meditation. ? Massage. ? Physical therapy. ? Pain management programs. ? Counseling. Follow these instructions at home: Medicines   Take over-the-counter and prescription medicines only as told by your health care provider.  Do not drive or use heavy machinery while taking prescription pain medicine.  If you are taking prescription pain medicine, take actions to prevent or treat constipation. Your health care provider may recommend that you: ? Drink enough fluid to keep your urine pale yellow. ? Eat foods that are high in fiber, such as fresh fruits and vegetables, whole grains, and beans. ? Limit foods that are high in fat and processed sugars, such as fried or sweet foods. ? Take an over-the-counter or prescription medicine for constipation. Lifestyle   Have a good support system at home.  Consider joining a chronic pain support group.  Do not use any products that contain nicotine or tobacco, such as cigarettes and e-cigarettes. If you need help quitting, ask your health care provider.  Do not drink alcohol. General instructions  Learn as much as you can about your condition.  Work closely with all your health care providers to find the treatment plan that works best for you.  Ask your health care provider what activities are safe for you.  Keep all follow-up visits as told by your health care provider. This is important. Contact a health care provider if:  Your pain treatments are not working.  You are having side effects  from your medicines.  You are struggling with tiredness (fatigue), mood changes, depression, or anxiety. Summary  Neuropathic pain is pain caused by damage to the nerves that are responsible for certain sensations in your body (sensory nerves).  Neuropathic pain may come and go as damaged nerves heal, or it may stay at the same level for years.  Neuropathic pain is usually a long-term condition that can be difficult to treat. Consider joining a chronic pain support group. This information is not intended to replace advice given to you by your health care provider. Make sure you discuss any questions you have with your health care provider. Document Revised: 12/04/2018 Document Reviewed: 08/30/2017 Elsevier Patient Education  [place].  Peripheral Neuropathy Peripheral neuropathy is a type of nerve damage. It affects nerves that carry signals between the spinal cord and the arms, legs, and the rest of the body (peripheral nerves). It does not affect nerves in the spinal cord or brain. In peripheral neuropathy, one nerve or a group of nerves may be damaged. Peripheral neuropathy is a broad category that includes many specific nerve disorders, like diabetic neuropathy, hereditary neuropathy, and carpal tunnel syndrome. What are the causes? This condition may be caused by:  Diabetes. This is the most common cause of peripheral neuropathy.  Nerve injury.  Pressure or stress on a nerve that  lasts a long time.  Lack (deficiency) of B vitamins. This can result from alcoholism, poor diet, or a restricted diet.  Infections.  Autoimmune diseases, such as rheumatoid arthritis and systemic lupus erythematosus.  Nerve diseases that are passed from parent to child (inherited).  Some medicines, such as cancer medicines (chemotherapy).  Poisonous (toxic) substances, such as lead and mercury.  Too little blood flowing to the legs.  Kidney disease.  Thyroid disease. In some cases,  the cause of this condition is not known. What are the signs or symptoms? Symptoms of this condition depend on which of your nerves is damaged. Common symptoms include:  Loss of feeling (numbness) in the feet, hands, or both.  Tingling in the feet, hands, or both.  Burning pain.  Very sensitive skin.  Weakness.  Not being able to move a part of the body (paralysis).  Muscle twitching.  Clumsiness or poor coordination.  Loss of balance.  Not being able to control your bladder.  Feeling dizzy.  Sexual problems. How is this diagnosed? Diagnosing and finding the cause of peripheral neuropathy can be difficult. Your health care provider will take your medical history and do a physical exam. A neurological exam will also be done. This involves checking things that are affected by your brain, spinal cord, and nerves (nervous system). For example, your health care provider will check your reflexes, how you move, and what you can feel. You may have other tests, such as:  Blood tests.  Electromyogram (EMG) and nerve conduction tests. These tests check nerve function and how well the nerves are controlling the muscles.  Imaging tests, such as CT scans or MRI to rule out other causes of your symptoms.  Removing a small piece of nerve to be examined in a lab (nerve biopsy). This is rare.  Removing and examining a small amount of the fluid that surrounds the brain and spinal cord (lumbar puncture). This is rare. How is this treated? Treatment for this condition may involve:  Treating the underlying cause of the neuropathy, such as diabetes, kidney disease, or vitamin deficiencies.  Stopping medicines that can cause neuropathy, such as chemotherapy.  Medicine to relieve pain. Medicines may include: ? Prescription or over-the-counter pain medicine. ? Antiseizure medicine. ? Antidepressants. ? Pain-relieving patches that are applied to painful areas of skin.  Surgery to relieve  pressure on a nerve or to destroy a nerve that is causing pain.  Physical therapy to help improve movement and balance.  Devices to help you move around (assistive devices). Follow these instructions at home: Medicines  Take over-the-counter and prescription medicines only as told by your health care provider. Do not take any other medicines without first asking your health care provider.  Do not drive or use heavy machinery while taking prescription pain medicine. Lifestyle   Do not use any products that contain nicotine or tobacco, such as cigarettes and e-cigarettes. Smoking keeps blood from reaching damaged nerves. If you need help quitting, ask your health care provider.  Avoid or limit alcohol. Too much alcohol can cause a vitamin B deficiency, and vitamin B is needed for healthy nerves.  Eat a healthy diet. This includes: ? Eating foods that are high in fiber, such as fresh fruits and vegetables, whole grains, and beans. ? Limiting foods that are high in fat and processed sugars, such as fried or sweet foods. General instructions   If you have diabetes, work closely with your health care provider to keep your blood sugar  under control.  If you have numbness in your feet: ? Check every day for signs of injury or infection. Watch for redness, warmth, and swelling. ? Wear padded socks and comfortable shoes. These help protect your feet.  Develop a good support system. Living with peripheral neuropathy can be stressful. Consider talking with a mental health specialist or joining a support group.  Use assistive devices and attend physical therapy as told by your health care provider. This may include using a walker or a cane.  Keep all follow-up visits as told by your health care provider. This is important. Contact a health care provider if:  You have new signs or symptoms of peripheral neuropathy.  You are struggling emotionally from dealing with peripheral  neuropathy.  Your pain is not well-controlled. Get help right away if:  You have an injury or infection that is not healing normally.  You develop new weakness in an arm or leg.  You fall frequently. Summary  Peripheral neuropathy is when the nerves in the arms, or legs are damaged, resulting in numbness, weakness, or pain.  There are many causes of peripheral neuropathy, including diabetes, pinched nerves, vitamin deficiencies, autoimmune disease, and hereditary conditions.  Diagnosing and finding the cause of peripheral neuropathy can be difficult. Your health care provider will take your medical history, do a physical exam, and do tests, including blood tests and nerve function tests.  Treatment involves treating the underlying cause of the neuropathy and taking medicines to help control pain. Physical therapy and assistive devices may also help. This information is not intended to replace advice given to you by your health care provider. Make sure you discuss any questions you have with your health care provider. Document Revised: 07/26/2017 Document Reviewed: 10/22/2016 Elsevier Patient Education  2020 [employer].

## 2019-09-10 NOTE — Progress Notes (Signed)
HPI:  54 year old male seen in follow-up of chronic medical issues including type 2 diabetes, hypertension and chronic low back pain with peripheral neuropathy.  He reports that his blood sugars have improved with dietary changes as well as use of Victoza however he had to discontinue his Victoza due to mid upper abdominal pain and nausea.  He reports fasting blood sugars that are now in the 120s to 130s or less and blood sugars have been as low as in the 80s.  He believes that he forgot to take his blood pressure medicine prior to today's visit.  He denies current headaches or dizziness related to his blood pressure.  He denies any current increased thirst or urinary frequency related to his blood sugars but continues to have painful numbness in his feet.  He also request refills of pain medication for continued treatment of his chronic low back pain.  Patient states that he is unable to stand for prolonged periods and activity such as gathering firewood caused him to have increased low back pain and limited his ability to complete chores.  He would like to be referred to orthopedics for further evaluation and treatment of his back pain.   Past Medical History:  Diagnosis Date  . Calculus of gallbladder without cholecystitis without obstruction   . Chronic pain    back  . Disc degeneration   . Disc degeneration, lumbar   . Headache     Past Surgical History:  Procedure Laterality Date  . CHOLECYSTECTOMY N/A 06/02/2018   Procedure: LAPAROSCOPIC CHOLECYSTECTOMY;  Surgeon: Vickie Epley, MD;  Location: [facility] ORS;  Service: General;  Laterality: N/A;  . KNEE ARTHROSCOPY     30 Years ago    Family History  Problem Relation Age of Onset  . Aneurysm Father     Social History   Socioeconomic History  . Marital status: Single    Spouse name: Not on file  . Number of children: Not on file  . Years of education: Not on file  . Highest education level: Not on file  Occupational  History  . Not on file  Tobacco Use  . Smoking status: Former Research scientist (life sciences)  . Smokeless tobacco: Never Used  Substance and Sexual Activity  . Alcohol use: Yes    Comment: cut back  . Drug use: No  . Sexual activity: Yes  Other Topics Concern  . Not on file  Social History Narrative  . Not on file   Social Determinants of Health   Financial Resource Strain:   . Difficulty of Paying Living Expenses: Not on file  Food Insecurity:   . Worried About Charity fundraiser in the Last Year: Not on file  . Ran Out of Food in the Last Year: Not on file  Transportation Needs:   . Lack of Transportation (Medical): Not on file  . Lack of Transportation (Non-Medical): Not on file  Physical Activity:   . Days of Exercise per Week: Not on file  . Minutes of Exercise per Session: Not on file  Stress:   . Feeling of Stress : Not on file  Social Connections:   . Frequency of Communication with Friends and Family: Not on file  . Frequency of Social Gatherings with Friends and Family: Not on file  . Attends Religious Services: Not on file  . Active Member of Clubs or Organizations: Not on file  . Attends Archivist Meetings: Not on file  . Marital Status: Not  on file  Intimate Partner Violence:   . Fear of Current or Ex-Partner: Not on file  . Emotionally Abused: Not on file  . Physically Abused: Not on file  . Sexually Abused: Not on file    Outpatient Medications Prior to Visit  Medication Sig Dispense Refill  . Accu-Chek FastClix Lancets MISC Use as instructed to check blood sugar up to 3 times daily. 102 each 11  . Ashwagandha 500 MG CAPS Take 1 capsule by mouth 3 (three) times daily.    Marland Kitchen BLACK CURRANT SEED OIL PO Take by mouth.    . Blood Glucose Monitoring Suppl (ACCU-CHEK GUIDE ME) w/Device KIT 1 kit by Does not apply route 3 (three) times daily. 1 kit 0  . Cinnamon 500 MG capsule Take 1,000 mg by mouth 2 (two) times daily.    . diclofenac (CATAFLAM) 50 MG tablet Take 1  tablet (50 mg total) by mouth 2 (two) times daily. As needed for pain. Take after eating 180 tablet 0  . diclofenac sodium (VOLTAREN) 1 % GEL Apply 1 application topically 3 (three) times daily as needed (pain).    Marland Kitchen glucose blood (ACCU-CHEK GUIDE) test strip Use as instructed to check blood sugar up to 3 times daily. 100 each 12  . Insulin Pen Needle (TRUEPLUS 5-BEVEL PEN NEEDLES) 32G X 4 MM MISC Use to inject Victoza once daily. 100 each 11  . liraglutide (VICTOZA) 18 MG/3ML SOPN Inject 0.'6mg'$  SQ daily x7 days, then 1.'2mg'$  daily x7 days, then 1.8 mg indefinitely. 6 mL 2  . lisinopril (ZESTRIL) 20 MG tablet Take 1 tablet (20 mg total) by mouth daily. 90 tablet 1  . metFORMIN (GLUCOPHAGE-XR) 500 MG 24 hr tablet Take 2 tablets (1,000 mg total) by mouth 2 (two) times daily with a meal. 120 tablet 2  . methocarbamol (ROBAXIN) 500 MG tablet Take 2 tablets (1,000 mg total) by mouth every 8 (eight) hours as needed for muscle spasms. 90 tablet 0  . Multiple Vitamins-Minerals (MULTIVITAMIN WITH MINERALS) tablet Take 1 tablet by mouth daily.    . naproxen sodium (ALEVE) 220 MG tablet Take 660 mg by mouth daily as needed (pain).    . Nutritional Supplements (DHEA PO) Take 100 mg by mouth daily.    . ondansetron (ZOFRAN) 4 MG tablet Take 1 tablet (4 mg total) by mouth every 8 (eight) hours as needed for nausea or vomiting. 20 tablet 0  . OVER THE COUNTER MEDICATION Take 1 drop by mouth 2 (two) times daily. CBD Oil    . traMADol (ULTRAM) 50 MG tablet Take 1 tablet (50 mg total) by mouth every 12 (twelve) hours as needed for severe pain. 20 tablet 0   Facility-Administered Medications Prior to Visit  Medication Dose Route Frequency Provider Last Rate Last Admin  . cephALEXin (KEFLEX) capsule 500 mg  500 mg Oral Once Festus Aloe, MD        Allergies  Allergen Reactions  . Hydrocodone Itching    Itching, face tingling, dizziness.  [name] [Oxycodone Hcl] Itching    ROS Review of Systems   Constitutional: Positive for fatigue. Negative for chills and fever.  HENT: Negative for sore throat and trouble swallowing.   Eyes: Negative for photophobia and visual disturbance.  Respiratory: Negative for cough and shortness of breath.   Cardiovascular: Negative for chest pain and palpitations.  Gastrointestinal: Positive for nausea. Negative for abdominal distention, abdominal pain, blood in stool, constipation and diarrhea.  Endocrine: Negative for polydipsia, polyphagia and polyuria.  Genitourinary: Negative for dysuria and frequency.  Musculoskeletal: Positive for arthralgias and back pain.  Neurological: Positive for weakness. Negative for dizziness and headaches.  Hematological: Negative for adenopathy. Does not bruise/bleed easily.      Objective:    Physical Exam  Constitutional: He is oriented to person, place, and time. He appears well-developed and well-nourished.  Well-nourished well-developed overweight for height male in no acute distress while sitting on exam chair but he has difficulty arising from a seated position without holding onto a cane or side of exam table  Neck: No JVD present. No thyromegaly present.  Cardiovascular: Normal rate and regular rhythm.  Pulmonary/Chest: Effort normal and breath sounds normal.  Abdominal: Soft. There is no abdominal tenderness. There is no rebound and no guarding.  Musculoskeletal:        General: Tenderness present.     Cervical back: Normal range of motion and neck supple.     Comments: Lumbosacral tenderness to palpation  Neurological: He is alert and oriented to person, place, and time.  Skin: Skin is warm and dry.  No active skin breakdown on the feet  Psychiatric: He has a normal mood and affect. His behavior is normal.  Nursing note and vitals reviewed.   BP (!) 168/95 (BP Location: Left Arm, Patient Position: Sitting, Cuff Size: Normal)   Pulse (!) 114   Temp 98.8 F (37.1 C) (Oral)   Ht '6\' 2"'$  (1.88 m)   Wt  244 lb (110.7 kg)   SpO2 99%   BMI 31.33 kg/m  Wt Readings from Last 3 Encounters:  09/10/19 244 lb (110.7 kg)  07/29/19 244 lb (110.7 kg)  07/28/19 244 lb (110.7 kg)     Health Maintenance Due  Topic Date Due  . PNEUMOCOCCAL POLYSACCHARIDE VACCINE AGE 13-64 HIGH RISK  08/22/1968  . FOOT EXAM  08/22/1976  . OPHTHALMOLOGY EXAM  08/22/1976  . HIV Screening  08/22/1981  . COLONOSCOPY  08/22/2016  . INFLUENZA VACCINE  03/28/2019      No results found for: TSH Lab Results  Component Value Date   WBC 7.3 06/02/2018   HGB 15.3 06/02/2018   HCT 44.9 06/02/2018   MCV 84.5 06/02/2018   PLT 252 06/02/2018   Lab Results  Component Value Date   NA 137 07/01/2019   K 4.6 07/01/2019   CO2 21 07/01/2019   GLUCOSE 237 (H) 07/01/2019   BUN 11 07/01/2019   CREATININE 0.78 07/01/2019   BILITOT 0.3 07/01/2019   ALKPHOS 138 (H) 07/01/2019   AST 59 (H) 07/01/2019   ALT 57 (H) 07/01/2019   PROT 7.2 07/01/2019   ALBUMIN 4.4 07/01/2019   CALCIUM 9.8 07/01/2019   ANIONGAP 6 06/02/2018   Lab Results  Component Value Date   CHOL 138 07/01/2019   Lab Results  Component Value Date   HDL 33 (L) 07/01/2019   Lab Results  Component Value Date   LDLCALC 76 07/01/2019   Lab Results  Component Value Date   TRIG 167 (H) 07/01/2019   Lab Results  Component Value Date   CHOLHDL 4.2 07/01/2019   Lab Results  Component Value Date   HGBA1C 9.1 (A) 07/01/2019      Assessment & Plan:  1. Uncontrolled type 2 diabetes mellitus with hyperglycemia ([place]) He is to keep his scheduled follow-up with endocrinology.  Glucose today is 165 and patient reports improvement in control of his blood sugars.  He will schedule lab visit for hemoglobin A1c, lipase and comprehensive metabolic panel  as lab was closed by the end of today's visit.  Lipase level is being done due to patient's use of Victoza and patient with complaint of occasional nausea/mid upper abdominal discomfort.  He agrees to be  referred to ophthalmology for yearly diabetic eye exam.  Diabetic foot care also discussed especially in light of patient's neuropathy. - Glucose (CBG) - Comprehensive metabolic panel - Hemoglobin A1c; Future - Lipase; Future  - Ambulatory referral to Ophthalmology  2. Essential hypertension Medication compliance encouraged.  He is currently on lisinopril 20 mg daily but this may need to be adjusted if blood pressure remains elevated.  Discussed blood pressure goal of 130/80 or less.  3. Chronic bilateral low back pain with bilateral sciatica Orthopedics referral placed in follow-up of patient's chronic low back pain with sciatica as patient reports that this is impairing his ability to perform everyday activities. - AMB referral to orthopedics - traMADol (ULTRAM) 50 MG tablet; Take 1 tablet (50 mg total) by mouth every 12 (twelve) hours as needed for severe pain.  Dispense: 60 tablet; Refill: 1  4. Neuropathy Patient with continued issues with neuropathy which are likely a combination of both lumbar radiculopathy and diabetic neuropathy.  He has been referred to neurology for further evaluation and treatment. - Ambulatory referral to Neurology  5. Encounter for long-term current use of medication Patient will have lipase and comprehensive metabolic panel in follow-up of his use of medications for treatment of diabetes, peripheral neuropathy/neuropathic pain and hypertension. - Lipase; Future -Comprehensive metabolic panel  6. Chronic pain disorder Refill provided for tramadol and patient is additionally to follow-up with pain management as well as orthopedics. - traMADol (ULTRAM) 50 MG tablet; Take 1 tablet (50 mg total) by mouth every 12 (twelve) hours as needed for severe pain.  Dispense: 60 tablet; Refill: 1   An After Visit Summary was printed and given to the patient.   Follow-up: An After Visit Summary was printed and given to the patient.   45 or more face-to-face minutes  spent with the patient at today's visit  Antony Blackbird, MD

## 2019-09-11 LAB — COMPREHENSIVE METABOLIC PANEL WITH GFR
ALT: 53 IU/L — ABNORMAL HIGH (ref 0–44)
AST: 54 IU/L — ABNORMAL HIGH (ref 0–40)
Albumin/Globulin Ratio: 1.5 (ref 1.2–2.2)
Albumin: 4.3 g/dL (ref 3.8–4.9)
Alkaline Phosphatase: 89 IU/L (ref 39–117)
BUN/Creatinine Ratio: 17 (ref 9–20)
BUN: 14 mg/dL (ref 6–24)
Bilirubin Total: 0.5 mg/dL (ref 0.0–1.2)
CO2: 21 mmol/L (ref 20–29)
Calcium: 9.4 mg/dL (ref 8.7–10.2)
Chloride: 103 mmol/L (ref 96–106)
Creatinine, Ser: 0.83 mg/dL (ref 0.76–1.27)
GFR calc Af Amer: 116 mL/min/1.73
GFR calc non Af Amer: 100 mL/min/1.73
Globulin, Total: 2.9 g/dL (ref 1.5–4.5)
Glucose: 144 mg/dL — ABNORMAL HIGH (ref 65–99)
Potassium: 4.4 mmol/L (ref 3.5–5.2)
Sodium: 139 mmol/L (ref 134–144)
Total Protein: 7.2 g/dL (ref 6.0–8.5)

## 2019-09-14 ENCOUNTER — Telehealth: Payer: Self-pay | Admitting: *Deleted

## 2019-09-14 NOTE — Telephone Encounter (Signed)
-----   Message from Antony Blackbird, MD sent at 09/11/2019 11:31 PM EST ----- Nonfasting glucose of 144 otherwise normal electrolytes.  Liver enzymes remain slightly elevated.  Patient should avoid the use of alcohol, acetaminophen or other agents which can irritate the liver.  Continue GI follow-up.

## 2019-09-14 NOTE — Telephone Encounter (Signed)
Patient verified DOB Patient is aware of liver remaining elevated and needing to avoid tylenol/ibuprofen.  Patient is aware of keeping appointment for lab and with ortho.

## 2019-09-21 ENCOUNTER — Other Ambulatory Visit: Payer: Self-pay | Admitting: Family Medicine

## 2019-09-21 DIAGNOSIS — M5442 Lumbago with sciatica, left side: Secondary | ICD-10-CM

## 2019-09-21 DIAGNOSIS — G8929 Other chronic pain: Secondary | ICD-10-CM

## 2019-09-21 DIAGNOSIS — E1142 Type 2 diabetes mellitus with diabetic polyneuropathy: Secondary | ICD-10-CM

## 2019-09-21 DIAGNOSIS — G894 Chronic pain syndrome: Secondary | ICD-10-CM

## 2019-09-21 MED FILL — VICTOZA 18 MG/3 ML INJECT P: 18 | 30 days supply | Qty: 9 | Fill #1

## 2019-09-21 MED FILL — metFORMIN HCL ER 500 MG TB2: 500 | 30 days supply | Qty: 120 | Fill #1

## 2019-09-21 MED FILL — LISINOPRIL 10 MG TABS: 10 | 30 days supply | Qty: 30 | Fill #2

## 2019-09-22 ENCOUNTER — Ambulatory Visit (INDEPENDENT_AMBULATORY_CARE_PROVIDER_SITE_OTHER): Payer: Medicaid Other

## 2019-09-22 ENCOUNTER — Ambulatory Visit (INDEPENDENT_AMBULATORY_CARE_PROVIDER_SITE_OTHER): Payer: Medicaid Other | Admitting: Orthopaedic Surgery

## 2019-09-22 ENCOUNTER — Other Ambulatory Visit: Payer: Self-pay

## 2019-09-22 ENCOUNTER — Encounter: Payer: Self-pay | Admitting: Orthopaedic Surgery

## 2019-09-22 VITALS — BP 179/103 | HR 101 | Ht 74.0 in | Wt 242.0 lb

## 2019-09-22 DIAGNOSIS — M542 Cervicalgia: Secondary | ICD-10-CM

## 2019-09-22 DIAGNOSIS — M545 Low back pain, unspecified: Secondary | ICD-10-CM

## 2019-09-22 DIAGNOSIS — M4722 Other spondylosis with radiculopathy, cervical region: Secondary | ICD-10-CM | POA: Diagnosis not present

## 2019-09-22 DIAGNOSIS — M47812 Spondylosis without myelopathy or radiculopathy, cervical region: Secondary | ICD-10-CM | POA: Insufficient documentation

## 2019-09-22 NOTE — Progress Notes (Signed)
Office Visit Note   Patient: Gerald Olson           Date of Birth: 1966-08-07           MRN: [id number] Visit Date: 09/22/2019              Requested by: Antony Blackbird, MD [place],  [place] [id number] PCP: Antony Blackbird, MD   Assessment & Plan: Visit Diagnoses:  1. Neck pain   2. Other spondylosis with radiculopathy, cervical region   3. Low back pain, unspecified back pain laterality, unspecified chronicity, unspecified whether sciatica present     Plan: Patient has neuropsychiatric visit.  We discussed treatment options with him including possible surgical intervention.  He understands that he needs to have his diabetes under control with an A1c less than 7.5.  He can return in 1 month for recheck.  We discussed cervical spine x-rays and significant spondylosis.  He will work on using his home cervical traction continue his Celebrex and Robaxin and do some the neck exercises taught by therapy in the past.  If he is having persistent problems with follow-up in a month we will consider diagnostic imaging.  Follow-Up Instructions: Return in about 1 month (around 10/23/2019).   Orders:  Orders Placed This Encounter  Procedures  . XR Cervical Spine 2 or 3 views   No orders of the defined types were placed in this encounter.     Procedures: No procedures performed   Clinical Data: No additional findings.   Subjective: Chief Complaint  Patient presents with  . Neck - Pain, Follow-up  . Lower Back - Pain, Follow-up    HPI 54 year old male returns for persistent problems with low back pain and some neck pain he states he has problems with his hand shaking with a tremor on the left particularly with outstretched reaching but he can bend his neck to the right and stretch it and after while the shaking stops he demonstrates this.  He is work on his diabetic control but last A1c was 9.1.  He states he has problems with numbness in his left leg.  He has been on  Celebrex, Robaxin, Naprosyn and takes tramadol at night.  He is also been on gabapentin.  He heats his wood with when stoves and has to cut wood.  He is used some home cervical traction with strength to help his neck.  Now he is on Victoza he is doing the keto diet.  Has a neuropsychiatric appointment coming up shortly for evaluation.  Review of Systems positive alcohol problems in the past, diabetes, hypertension.  Back pain, chronic.  Otherwise negative as pertains HPI.   Objective: Vital Signs: BP (!) 179/103   Pulse (!) 101   Ht 6\' 2"  (1.88 m)   Wt 242 lb (109.8 kg)   BMI 31.07 kg/m   Physical Exam Constitutional:      Appearance: He is well-developed.  HENT:     Head: Normocephalic and atraumatic.  Eyes:     Pupils: Pupils are equal, round, and reactive to light.  Neck:     Thyroid: No thyromegaly.     Trachea: No tracheal deviation.  Cardiovascular:     Rate and Rhythm: Normal rate.  Pulmonary:     Effort: Pulmonary effort is normal.     Breath sounds: No wheezing.  Abdominal:     General: Bowel sounds are normal.     Palpations: Abdomen is soft.  Skin:  General: Skin is warm and dry.     Capillary Refill: Capillary refill takes less than 2 seconds.  Neurological:     Mental Status: He is alert and oriented to person, place, and time.  Psychiatric:        Behavior: Behavior normal.        Thought Content: Thought content normal.        Judgment: Judgment normal.     Ortho Exam negative logroll of the hips.  Reflexes are intact normal heel to gait.  Patient has brachial plexus tenderness right and left upper extremity reflexes are 2+ and symmetrical.  Specialty Comments:  No specialty comments available.  Imaging: No results found.   PMFS History: Patient Active Problem List   Diagnosis Date Noted  . Other spondylosis with radiculopathy, cervical region 09/22/2019  . Low back pain 07/28/2019  . Essential hypertension, benign 01/28/2019  . Alcoholic  cirrhosis of liver ([place]) 06/02/2018  . Uncontrolled type 2 diabetes mellitus with hyperglycemia ([place]) 04/25/2018  . Alcohol use disorder, mild, abuse 04/25/2018   Past Medical History:  Diagnosis Date  . Calculus of gallbladder without cholecystitis without obstruction   . Chronic pain    back  . Disc degeneration   . Disc degeneration, lumbar   . Headache     Family History  Problem Relation Age of Onset  . Aneurysm Father     Past Surgical History:  Procedure Laterality Date  . CHOLECYSTECTOMY N/A 06/02/2018   Procedure: LAPAROSCOPIC CHOLECYSTECTOMY;  Surgeon: Vickie Epley, MD;  Location: [facility] ORS;  Service: General;  Laterality: N/A;  . KNEE ARTHROSCOPY     30 Years ago   Social History   Occupational History  . Not on file  Tobacco Use  . Smoking status: Former Research scientist (life sciences)  . Smokeless tobacco: Never Used  Substance and Sexual Activity  . Alcohol use: Yes    Comment: cut back  . Drug use: No  . Sexual activity: Yes

## 2019-10-01 ENCOUNTER — Ambulatory Visit: Payer: Medicaid Other | Attending: Family Medicine

## 2019-10-01 ENCOUNTER — Other Ambulatory Visit: Payer: Self-pay

## 2019-10-01 DIAGNOSIS — E1165 Type 2 diabetes mellitus with hyperglycemia: Secondary | ICD-10-CM | POA: Diagnosis not present

## 2019-10-01 DIAGNOSIS — Z79899 Other long term (current) drug therapy: Secondary | ICD-10-CM | POA: Diagnosis not present

## 2019-10-02 LAB — LIPASE: Lipase: 113 U/L — ABNORMAL HIGH (ref 13–78)

## 2019-10-02 LAB — COMPREHENSIVE METABOLIC PANEL WITH GFR
ALT: 52 IU/L — ABNORMAL HIGH (ref 0–44)
AST: 39 IU/L (ref 0–40)
Albumin/Globulin Ratio: 1.8 (ref 1.2–2.2)
Albumin: 4.4 g/dL (ref 3.8–4.9)
Alkaline Phosphatase: 88 IU/L (ref 39–117)
BUN/Creatinine Ratio: 15 (ref 9–20)
BUN: 14 mg/dL (ref 6–24)
Bilirubin Total: 0.5 mg/dL (ref 0.0–1.2)
CO2: 22 mmol/L (ref 20–29)
Calcium: 10.4 mg/dL — ABNORMAL HIGH (ref 8.7–10.2)
Chloride: 99 mmol/L (ref 96–106)
Creatinine, Ser: 0.95 mg/dL (ref 0.76–1.27)
GFR calc Af Amer: 105 mL/min/1.73
GFR calc non Af Amer: 91 mL/min/1.73
Globulin, Total: 2.4 g/dL (ref 1.5–4.5)
Glucose: 241 mg/dL — ABNORMAL HIGH (ref 65–99)
Potassium: 5.2 mmol/L (ref 3.5–5.2)
Sodium: 139 mmol/L (ref 134–144)
Total Protein: 6.8 g/dL (ref 6.0–8.5)

## 2019-10-02 LAB — HEMOGLOBIN A1C
Est. average glucose Bld gHb Est-mCnc: 171 mg/dL
Hgb A1c MFr Bld: 7.6 % — ABNORMAL HIGH (ref 4.8–5.6)

## 2019-10-08 ENCOUNTER — Encounter: Payer: Self-pay | Admitting: Neurology

## 2019-10-08 ENCOUNTER — Other Ambulatory Visit: Payer: Self-pay

## 2019-10-08 ENCOUNTER — Ambulatory Visit: Payer: Medicaid Other | Admitting: Neurology

## 2019-10-08 VITALS — BP 176/105 | HR 106 | Temp 97.7°F | Ht 74.0 in | Wt 255.0 lb

## 2019-10-08 DIAGNOSIS — E1342 Other specified diabetes mellitus with diabetic polyneuropathy: Secondary | ICD-10-CM | POA: Diagnosis not present

## 2019-10-08 DIAGNOSIS — M5432 Sciatica, left side: Secondary | ICD-10-CM | POA: Diagnosis not present

## 2019-10-08 DIAGNOSIS — M5440 Lumbago with sciatica, unspecified side: Secondary | ICD-10-CM | POA: Diagnosis not present

## 2019-10-08 DIAGNOSIS — M792 Neuralgia and neuritis, unspecified: Secondary | ICD-10-CM | POA: Diagnosis not present

## 2019-10-08 DIAGNOSIS — G8929 Other chronic pain: Secondary | ICD-10-CM

## 2019-10-08 MED ORDER — GABAPENTIN 300 MG PO CAPS: 300.0000 mg | ORAL_CAPSULE | Freq: Three times a day (TID) | ORAL | 2 refills | Status: DC

## 2019-10-08 MED FILL — GABAPENTIN 300 MG CAPSULE: 300 | 32 days supply | Qty: 90 | Fill #0

## 2019-10-08 NOTE — Progress Notes (Signed)
[facility] [address] [place]. [place] [address] [phone]       OFFICE CONSULT NOTE  [name] Date of Birth:  [birth year] Medical Record Number:  [id number]   Referring MD: Cammie Fulp Reason for Referral: Neuropathy HPI: [name] is a 54 year old Caucasian male with past medical history of diabetes, hypertension, obesity, degenerative arthritis and chronic back and neck pain who has been having lower extremity paresthesias for the last 6 months which seem to be progressive.  He complains of constant burning, tingling and pins and needle sensation.  At times this becomes bearable.  This is present throughout the day.  He recently started taking gabapentin 100 mg twice daily which is not being effective.  He is also noticed that his balance is poor and has had frequent falls.  In fact he had a fall yesterday and today seems to be in acute pain and his gait is limited.  He has a longstanding history of degenerative cervical and lumbar spine disease.  He has been seen by orthopedic surgeon wants to operate on his neck but his diabetes is out of control and is working with endocrinologist to bring it under control so he can have the surgery.  Review of electronic medical records show that he had a cervical spine x-ray on 09/22/2019 which showed degenerative changes from C3-C7.  X-rays of lumbar spine on 07/21/2018 show significant facet degenerative changes at L5-S1 with significant foraminal narrowing on the right at the L4-5 and on the left at L5-S1.  He takes tramadol and muscle relaxant Robaxin which helps partially only.  He has not had any EMG nerve conduction studies done or lab work for other reversible causes of neuropathy done.  He states his feet are now constantly numb with lack of feeling which has been contributing to his falling.  His sugars have been persistently high and last A1c on 10/01/2019 was yet high at 7.6 but improved from 9.1 from 3 months ago. ROS:    14 system review of systems is positive for back pain, sciatica, leg pain, tingling, numbness, frequent falls, gait imbalance, neck pain, hand tremor and all other systems negative  PMH:  Past Medical History:  Diagnosis Date  . Calculus of gallbladder without cholecystitis without obstruction   . Chronic pain    back  . Disc degeneration   . Disc degeneration, lumbar   . Headache   . Hypertension     Social History:  Social History   Socioeconomic History  . Marital status: Single    Spouse name: Not on file  . Number of children: Not on file  . Years of education: Not on file  . Highest education level: Not on file  Occupational History  . Not on file  Tobacco Use  . Smoking status: Former Research scientist (life sciences)  . Smokeless tobacco: Never Used  Substance and Sexual Activity  . Alcohol use: Yes    Comment: cut back  . Drug use: No  . Sexual activity: Yes  Other Topics Concern  . Not on file  Social History Narrative  . Not on file   Social Determinants of Health   Financial Resource Strain:   . Difficulty of Paying Living Expenses: Not on file  Food Insecurity:   . Worried About Charity fundraiser in the Last Year: Not on file  . Ran Out of Food in the Last Year: Not on file  Transportation Needs:   . Lack of Transportation (Medical):  Not on file  . Lack of Transportation (Non-Medical): Not on file  Physical Activity:   . Days of Exercise per Week: Not on file  . Minutes of Exercise per Session: Not on file  Stress:   . Feeling of Stress : Not on file  Social Connections:   . Frequency of Communication with Friends and Family: Not on file  . Frequency of Social Gatherings with Friends and Family: Not on file  . Attends Religious Services: Not on file  . Active Member of Clubs or Organizations: Not on file  . Attends Archivist Meetings: Not on file  . Marital Status: Not on file  Intimate Partner Violence:   . Fear of Current or Ex-Partner: Not on file  .  Emotionally Abused: Not on file  . Physically Abused: Not on file  . Sexually Abused: Not on file    Medications:   Current Outpatient Medications on File Prior to Visit  Medication Sig Dispense Refill  . Accu-Chek FastClix Lancets MISC Use as instructed to check blood sugar up to 3 times daily. 102 each 11  . Ashwagandha 500 MG CAPS Take 1 capsule by mouth 3 (three) times daily.    Marland Kitchen BLACK CURRANT SEED OIL PO Take by mouth.    . Blood Glucose Monitoring Suppl (ACCU-CHEK GUIDE ME) w/Device KIT 1 kit by Does not apply route 3 (three) times daily. 1 kit 0  . Cinnamon 500 MG capsule Take 1,000 mg by mouth 2 (two) times daily.    . diclofenac sodium (VOLTAREN) 1 % GEL Apply 1 application topically 3 (three) times daily as needed (pain).    Marland Kitchen glucose blood (ACCU-CHEK GUIDE) test strip Use as instructed to check blood sugar up to 3 times daily. 100 each 12  . Insulin Pen Needle (TRUEPLUS 5-BEVEL PEN NEEDLES) 32G X 4 MM MISC Use to inject Victoza once daily. 100 each 11  . lisinopril (ZESTRIL) 20 MG tablet Take 1 tablet (20 mg total) by mouth daily. 90 tablet 1  . metFORMIN (GLUCOPHAGE-XR) 500 MG 24 hr tablet Take 2 tablets (1,000 mg total) by mouth 2 (two) times daily with a meal. 120 tablet 2  . methocarbamol (ROBAXIN) 500 MG tablet Take 2 tablets (1,000 mg total) by mouth every 8 (eight) hours as needed for muscle spasms. 90 tablet 0  . Multiple Vitamins-Minerals (MULTIVITAMIN WITH MINERALS) tablet Take 1 tablet by mouth daily.    . naproxen sodium (ALEVE) 220 MG tablet Take 660 mg by mouth daily as needed (pain).    . Nutritional Supplements (DHEA PO) Take 100 mg by mouth daily.    . ondansetron (ZOFRAN) 4 MG tablet Take 1 tablet (4 mg total) by mouth every 8 (eight) hours as needed for nausea or vomiting. 20 tablet 0  . traMADol (ULTRAM) 50 MG tablet Take 1 tablet (50 mg total) by mouth every 12 (twelve) hours as needed for severe pain. 60 tablet 1   No current facility-administered  medications on file prior to visit.    Allergies:   Allergies  Allergen Reactions  . Hydrocodone Itching    Itching, face tingling, dizziness.  [name] [Oxycodone Hcl] Itching    Physical Exam General: Obese middle-aged Caucasian male seated, in no evident distress Head: head normocephalic and atraumatic.   Neck: supple with no carotid or supraclavicular bruits Cardiovascular: regular rate and rhythm, no murmurs Musculoskeletal: Low back pain with tenderness.  Straight leg raising limited bilaterally. Skin:  no rash/petichiae Vascular:  Normal  pulses all extremities  Neurologic Exam Mental Status: Awake and fully alert. Oriented to place and time. Recent and remote memory intact. Attention span, concentration and fund of knowledge appropriate. Mood and affect appropriate.  Cranial Nerves: Fundoscopic exam reveals sharp disc margins. Pupils equal, briskly reactive to light. Extraocular movements full without nystagmus. Visual fields full to confrontation. Hearing intact. Facial sensation intact. Face, tongue, palate moves normally and symmetrically.  Motor: Normal bulk and tone. Normal strength in all tested extremity muscles. Sensory.:  Diminished   touch , pinprick sensation in both feet in the stocking areas down.  Impaired vibration in both feet from ankle down.  Position sense mildly impaired.  Romberg sign negative.  Fine action tremor of both outstretched upper extremities left greater than right.  No pill-rolling or cogwheel rigidity. Coordination: Rapid alternating movements normal in all extremities. Finger-to-nose and heel-to-shin performed accurately bilaterally. Gait and Station: Arises from chair with  difficulty. Stance is stooped and favors his back.  Broad-based antalgic gait uses a cane. Reflexes: 1+ and symmetric in upper extremities and absent in both lower extremities. Toes downgoing.       ASSESSMENT: 54 year old Caucasian male with lower extremity  paresthesias and pain due to worsening diabetic peripheral neuropathic pain.  He also has chronic back pain and left leg sciatica as well as increasing falls and gait difficulties due to degenerative lumbar and cervical spine disease.     PLAN: I had a long discussion with the patient regarding his lower extremity pain and paresthesias which likely represent his worsening diabetic peripheral neuropathy due to uncontrolled diabetes.  I recommend strict control of diabetes with hemoglobin A1c goal below 6.5%.  Increase gabapentin to 300 mg 3 times daily if tolerated for neuropathic pain.  If this is not effective may consider switching to Topamax or Lyrica in the future.  Check neuropathy panel labs and EMG nerve conduction study.  He will follow-up with his orthopedic doctor for his chronic back pain, left leg sciatica and neck surgery.  He was advised to use his cane at all times and we discussed fall prevention precautions as well.  Greater than 50% time during this 50-minute consultation visit was spent on counseling and coordination of care about his neuropathic pain, diabetic neuropathy and answering questions. He will return for follow-up with me in 2 months or call earlier if necessary. Antony Contras, MD Note: This document was prepared with digital dictation and possible smart phrase technology. Any transcriptional errors that result from this process are unintentional.

## 2019-10-08 NOTE — Patient Instructions (Signed)
I had a long discussion with the patient regarding his lower extremity pain and paresthesias which likely represent his worsening diabetic peripheral neuropathy due to uncontrolled diabetes.  I recommend strict control of diabetes with hemoglobin A1c goal below 6.5%.  Increase gabapentin to 300 mg 3 times daily if tolerated for neuropathic pain.  If this is not effective may consider switching to Topamax or Lyrica in the future.  Check neuropathy panel labs and EMG nerve conduction study.  He will follow-up with his orthopedic doctor for his chronic back pain, left leg sciatica and neck surgery.  He was advised to use his cane at all times and we discussed fall prevention precautions as well.  He will return for follow-up with me in 3 months or call earlier if necessary.  Neuropathic Pain Neuropathic pain is pain caused by damage to the nerves that are responsible for certain sensations in your body (sensory nerves). The pain can be caused by:  Damage to the sensory nerves that send signals to your spinal cord and brain (peripheral nervous system).  Damage to the sensory nerves in your brain or spinal cord (central nervous system). Neuropathic pain can make you more sensitive to pain. Even a minor sensation can feel very painful. This is usually a long-term condition that can be difficult to treat. The type of pain differs from person to person. It may:  Start suddenly (acute), or it may develop slowly and last for a long time (chronic).  Come and go as damaged nerves heal, or it may stay at the same level for years.  Cause emotional distress, loss of sleep, and a lower quality of life. What are the causes? The most common cause of this condition is diabetes. Many other diseases and conditions can also cause neuropathic pain. Causes of neuropathic pain can be classified as:  Toxic. This is caused by medicines and chemicals. The most common cause of toxic neuropathic pain is damage from cancer  treatments (chemotherapy).  Metabolic. This can be caused by: ? Diabetes. This is the most common disease that damages the nerves. ? Lack of vitamin B from long-term alcohol abuse.  Traumatic. Any injury that cuts, crushes, or stretches a nerve can cause damage and pain. A common example is feeling pain after losing an arm or leg (phantom limb pain).  Compression-related. If a sensory nerve gets trapped or compressed for a long period of time, the blood supply to the nerve can be cut off.  Vascular. Many blood vessel diseases can cause neuropathic pain by decreasing blood supply and oxygen to nerves.  Autoimmune. This type of pain results from diseases in which the body's defense system (immune system) mistakenly attacks sensory nerves. Examples of autoimmune diseases that can cause neuropathic pain include lupus and multiple sclerosis.  Infectious. Many types of viral infections can damage sensory nerves and cause pain. Shingles infection is a common cause of this type of pain.  Inherited. Neuropathic pain can be a symptom of many diseases that are passed down through families (genetic). What increases the risk? You are more likely to develop this condition if:  You have diabetes.  You smoke.  You drink too much alcohol.  You are taking certain medicines, including medicines that kill cancer cells (chemotherapy) or that treat immune system disorders. What are the signs or symptoms? The main symptom is pain. Neuropathic pain is often described as:  Burning.  Shock-like.  Stinging.  Hot or cold.  Itching. How is this diagnosed? No single  test can diagnose neuropathic pain. It is diagnosed based on:  Physical exam and your symptoms. Your health care provider will ask you about your pain. You may be asked to use a pain scale to describe how bad your pain is.  Tests. These may be done to see if you have a high sensitivity to pain and to help find the cause and location of any  sensory nerve damage. They include: ? Nerve conduction studies to test how well nerve signals travel through your sensory nerves (electrodiagnostic testing). ? Stimulating your sensory nerves through electrodes on your skin and measuring the response in your spinal cord and brain (somatosensory evoked potential).  Imaging studies, such as: ? X-rays. ? CT scan. ? MRI. How is this treated? Treatment for neuropathic pain may change over time. You may need to try different treatment options or a combination of treatments. Some options include:  Treating the underlying cause of the neuropathy, such as diabetes, kidney disease, or vitamin deficiencies.  Stopping medicines that can cause neuropathy, such as chemotherapy.  Medicine to relieve pain. Medicines may include: ? Prescription or over-the-counter pain medicine. ? Anti-seizure medicine. ? Antidepressant medicines. ? Pain-relieving patches that are applied to painful areas of skin. ? A medicine to numb the area (local anesthetic), which can be injected as a nerve block.  Transcutaneous nerve stimulation. This uses electrical currents to block painful nerve signals. The treatment is painless.  Alternative treatments, such as: ? Acupuncture. ? Meditation. ? Massage. ? Physical therapy. ? Pain management programs. ? Counseling. Follow these instructions at home: Medicines   Take over-the-counter and prescription medicines only as told by your health care provider.  Do not drive or use heavy machinery while taking prescription pain medicine.  If you are taking prescription pain medicine, take actions to prevent or treat constipation. Your health care provider may recommend that you: ? Drink enough fluid to keep your urine pale yellow. ? Eat foods that are high in fiber, such as fresh fruits and vegetables, whole grains, and beans. ? Limit foods that are high in fat and processed sugars, such as fried or sweet foods. ? Take an  over-the-counter or prescription medicine for constipation. Lifestyle   Have a good support system at home.  Consider joining a chronic pain support group.  Do not use any products that contain nicotine or tobacco, such as cigarettes and e-cigarettes. If you need help quitting, ask your health care provider.  Do not drink alcohol. General instructions  Learn as much as you can about your condition.  Work closely with all your health care providers to find the treatment plan that works best for you.  Ask your health care provider what activities are safe for you.  Keep all follow-up visits as told by your health care provider. This is important. Contact a health care provider if:  Your pain treatments are not working.  You are having side effects from your medicines.  You are struggling with tiredness (fatigue), mood changes, depression, or anxiety. Summary  Neuropathic pain is pain caused by damage to the nerves that are responsible for certain sensations in your body (sensory nerves).  Neuropathic pain may come and go as damaged nerves heal, or it may stay at the same level for years.  Neuropathic pain is usually a long-term condition that can be difficult to treat. Consider joining a chronic pain support group. This information is not intended to replace advice given to you by your health care provider. Make  sure you discuss any questions you have with your health care provider. Document Revised: 12/04/2018 Document Reviewed: 08/30/2017 Elsevier Patient Education  [place].  Diabetic Neuropathy Diabetic neuropathy refers to nerve damage that is caused by diabetes (diabetes mellitus). Over time, people with diabetes can develop nerve damage throughout the body. There are several types of diabetic neuropathy:  Peripheral neuropathy. This is the most common type of diabetic neuropathy. It causes damage to nerves that carry signals between the spinal cord and other  parts of the body (peripheral nerves). This usually affects nerves in the feet and legs first, and may eventually affect the hands and arms. The damage affects the ability to sense touch or temperature.  Autonomic neuropathy. This type causes damage to nerves that control involuntary functions (autonomic nerves). These nerves carry signals that control: ? Heartbeat. ? Body temperature. ? Blood pressure. ? Urination. ? Digestion. ? Sweating. ? Sexual function. ? Response to changing blood sugar (glucose) levels.  Focal neuropathy. This type of nerve damage affects one area of the body, such as an arm, a leg, or the face. The injury may involve one nerve or a small group of nerves. Focal neuropathy can be painful and unpredictable, and occurs most often in older adults with diabetes. This often develops suddenly, but usually improves over time and does not cause long-term problems.  Proximal neuropathy. This type of nerve damage affects the nerves of the thighs, hips, buttocks, or legs. It causes severe pain, weakness, and muscle death (atrophy), usually in the thigh muscles. It is more common among older men and people who have type 2 diabetes. The length of recovery time may vary. What are the causes? Peripheral, autonomic, and focal neuropathies are caused by diabetes that is not well controlled with treatment. The cause of proximal neuropathy is not known, but it may be caused by inflammation related to uncontrolled blood glucose levels. What are the signs or symptoms? Peripheral neuropathy Peripheral neuropathy develops slowly over time. When the nerves of the feet and legs no longer work, you may experience:  Burning, stabbing, or aching pain in the legs or feet.  Pain or cramping in the legs or feet.  Loss of feeling (numbness) and inability to feel pressure or pain in the feet. This can lead to: ? Thick calluses or sores on areas of constant pressure. ? Ulcers. ? Reduced ability to  feel temperature changes.  Foot deformities.  Muscle weakness.  Loss of balance or coordination. Autonomic neuropathy The symptoms of autonomic neuropathy vary depending on which nerves are affected. Symptoms may include:  Problems with digestion, such as: ? Nausea or vomiting. ? Poor appetite. ? Bloating. ? Diarrhea or constipation. ? Trouble swallowing. ? Losing weight without trying to.  Problems with the heart, blood and lungs, such as: ? Dizziness, especially when standing up. ? Fainting. ? Shortness of breath. ? Irregular heartbeat.  Bladder problems, such as: ? Trouble starting or stopping urination. ? Leaking urine. ? Trouble emptying the bladder. ? Urinary tract infections (UTIs).  Problems with other body functions, such as: ? Sweat. You may sweat too much or too little. ? Temperature. You might get hot easily. Or, you might feel cold more than usual. ? Sexual function. Men may not be able to get or maintain an erection. Women may have vaginal dryness and difficulty with arousal. Focal neuropathy Symptoms affect only one area of the body. Common symptoms include:  Numbness.  Tingling.  Burning pain.  Prickling feeling.  Very sensitive skin.  Weakness.  Inability to move (paralysis).  Muscle twitching.  Muscles getting smaller (wasting).  Poor coordination.  Double or blurred vision. Proximal neuropathy  Sudden, severe pain in the hip, thigh, or buttocks. Pain may spread from the back into the legs (sciatica).  Pain and numbness in the arms and legs.  Tingling.  Loss of bladder control or bowel control.  Weakness and wasting of thigh muscles.  Difficulty getting up from a seated position.  Abdominal swelling.  Unexplained weight loss. How is this diagnosed? Diagnosis usually involves reviewing your medical history and any symptoms you have. Diagnosis varies depending on the type of neuropathy your health care provider  suspects. Peripheral neuropathy Your health care provider will check areas that are affected by your nervous system (neurologic exam), such as your reflexes, how you move, and what you can feel. You may have other tests, such as:  Blood tests.  Removal and examination of fluid that surrounds the spinal cord (lumbar puncture).  CT scan.  MRI.  A test to check the nerves that control muscles (electromyogram, EMG).  Tests of how quickly messages pass through your nerves (nerve conduction velocity tests).  Removal of a small piece of nerve to be examined under a microscope (biopsy). Autonomic neuropathy You may have tests, such as:  Tests to measure your blood pressure and heart rate. This may include monitoring you while you are safely secured to an exam table that moves you from a lying position to an upright position (table tilt test).  Breathing tests to check your lungs.  Tests to check how food moves through the digestive system (gastric emptying tests).  Blood, sweat, or urine tests.  Ultrasound of your bladder.  Spinal fluid tests. Focal neuropathy This condition may be diagnosed with:  A neurologic exam.  CT scan.  MRI.  EMG.  Nerve conduction velocity tests. Proximal neuropathy There is no test to diagnose this type of neuropathy. You may have tests to rule out other possible causes of this type of neuropathy. Tests may include:  X-rays of your spine and lumbar region.  Lumbar puncture.  MRI. How is this treated? The goal of treatment is to keep nerve damage from getting worse. The most important part of treatment is keeping your blood glucose level and your A1C level within your target range by following your diabetes management plan. Over time, maintaining lower blood glucose levels helps lessen symptoms. In some cases, you may need prescription pain medicine. Follow these instructions at home:  Lifestyle   Do not use any products that contain  nicotine or tobacco, such as cigarettes and e-cigarettes. If you need help quitting, ask your health care provider.  Be physically active every day. Include strength training and balance exercises.  Follow a healthy meal plan.  Work with your health care provider to manage your blood pressure. General instructions  Follow your diabetes management plan as directed. ? Check your blood glucose levels as directed by your health care provider. ? Keep your blood glucose in your target range as directed by your health care provider. ? Have your A1C level checked at least two times a year, or as often as told by your health care provider.  Take over the counter and prescription medicines only as told by your health care provider. This includes insulin and diabetes medicine.  Do not drive or use heavy machinery while taking prescription pain medicines.  Check your skin and feet every day for cuts, bruises,  redness, blisters, or sores.  Keep all follow up visits as told by your health care provider. This is important. Contact a health care provider if:  You have burning, stabbing, or aching pain in your legs or feet.  You are unable to feel pressure or pain in your feet.  You develop problems with digestion, such as: ? Nausea. ? Vomiting. ? Bloating. ? Constipation. ? Diarrhea. ? Abdominal pain.  You have difficulty with urination, such as inability: ? To control when you urinate (incontinence). ? To completely empty the bladder (retention).  You have palpitations.  You feel dizzy, weak, or faint when you stand up. Get help right away if:  You cannot urinate.  You have sudden weakness or loss of coordination.  You have trouble speaking.  You have pain or pressure in your chest.  You have an irregular heart beat.  You have sudden inability to move a part of your body. Summary  Diabetic neuropathy refers to nerve damage that is caused by diabetes. It can affect nerves  throughout the entire body, causing numbness and pain in the arms, legs, digestive tract, heart, and other body systems.  Keep your blood glucose level and your blood pressure in your target range, as directed by your health care provider. This can help prevent neuropathy from getting worse.  Check your skin and feet every day for cuts, bruises, redness, blisters, or sores.  Do not use any products that contain nicotine or tobacco, such as cigarettes and e-cigarettes. If you need help quitting, ask your health care provider. This information is not intended to replace advice given to you by your health care provider. Make sure you discuss any questions you have with your health care provider. Document Revised: 09/25/2017 Document Reviewed: 09/17/2016 Elsevier Patient Education  [place] Prevention in the Home, Adult Falls can cause injuries. They can happen to people of all ages. There are many things you can do to make your home safe and to help prevent falls. Ask for help when making these changes, if needed. What actions can I take to prevent falls? General Instructions  Use good lighting in all rooms. Replace any light bulbs that burn out.  Turn on the lights when you go into a dark area. Use night-lights.  Keep items that you use often in easy-to-reach places. Lower the shelves around your home if necessary.  Set up your furniture so you have a clear path. Avoid moving your furniture around.  Do not have throw rugs and other things on the floor that can make you trip.  Avoid walking on wet floors.  If any of your floors are uneven, fix them.  Add color or contrast paint or tape to clearly mark and help you see: ? Any grab bars or handrails. ? First and last steps of stairways. ? Where the edge of each step is.  If you use a stepladder: ? Make sure that it is fully opened. Do not climb a closed stepladder. ? Make sure that both sides of the stepladder are  locked into place. ? Ask someone to hold the stepladder for you while you use it.  If there are any pets around you, be aware of where they are. What can I do in the bathroom?      Keep the floor dry. Clean up any water that spills onto the floor as soon as it happens.  Remove soap buildup in the tub or shower regularly.  Use non-skid  mats or decals on the floor of the tub or shower.  Attach bath mats securely with double-sided, non-slip rug tape.  If you need to sit down in the shower, use a plastic, non-slip stool.  Install grab bars by the toilet and in the tub and shower. Do not use towel bars as grab bars. What can I do in the bedroom?  Make sure that you have a light by your bed that is easy to reach.  Do not use any sheets or blankets that are too big for your bed. They should not hang down onto the floor.  Have a firm chair that has side arms. You can use this for support while you get dressed. What can I do in the kitchen?  Clean up any spills right away.  If you need to reach something above you, use a strong step stool that has a grab bar.  Keep electrical cords out of the way.  Do not use floor polish or wax that makes floors slippery. If you must use wax, use non-skid floor wax. What can I do with my stairs?  Do not leave any items on the stairs.  Make sure that you have a light switch at the top of the stairs and the bottom of the stairs. If you do not have them, ask someone to add them for you.  Make sure that there are handrails on both sides of the stairs, and use them. Fix handrails that are broken or loose. Make sure that handrails are as long as the stairways.  Install non-slip stair treads on all stairs in your home.  Avoid having throw rugs at the top or bottom of the stairs. If you do have throw rugs, attach them to the floor with carpet tape.  Choose a carpet that does not hide the edge of the steps on the stairway.  Check any carpeting to  make sure that it is firmly attached to the stairs. Fix any carpet that is loose or worn. What can I do on the outside of my home?  Use bright outdoor lighting.  Regularly fix the edges of walkways and driveways and fix any cracks.  Remove anything that might make you trip as you walk through a door, such as a raised step or threshold.  Trim any bushes or trees on the path to your home.  Regularly check to see if handrails are loose or broken. Make sure that both sides of any steps have handrails.  Install guardrails along the edges of any raised decks and porches.  Clear walking paths of anything that might make someone trip, such as tools or rocks.  Have any leaves, snow, or ice cleared regularly.  Use sand or salt on walking paths during winter.  Clean up any spills in your garage right away. This includes grease or oil spills. What other actions can I take?  Wear shoes that: ? Have a low heel. Do not wear high heels. ? Have rubber bottoms. ? Are comfortable and fit you well. ? Are closed at the toe. Do not wear open-toe sandals.  Use tools that help you move around (mobility aids) if they are needed. These include: ? Canes. ? Walkers. ? Scooters. ? Crutches.  Review your medicines with your doctor. Some medicines can make you feel dizzy. This can increase your chance of falling. Ask your doctor what other things you can do to help prevent falls. Where to find more information  Centers for Disease Control  and Prevention, [facility]: https://garcia.biz/  [employer] on Aging: BrainJudge.co.uk Contact a doctor if:  You are afraid of falling at home.  You feel weak, drowsy, or dizzy at home.  You fall at home. Summary  There are many simple things that you can do to make your home safe and to help prevent falls.  Ways to make your home safe include removing tripping hazards and installing grab bars in the bathroom.  Ask for help when making these  changes in your home. This information is not intended to replace advice given to you by your health care provider. Make sure you discuss any questions you have with your health care provider. Document Revised: 12/04/2018 Document Reviewed: 03/28/2017 Elsevier Patient Education  2020 [employer].

## 2019-10-13 LAB — NEUROPATHY PANEL
A/G Ratio: 1.3 (ref 0.7–1.7)
Albumin ELP: 3.9 g/dL (ref 2.9–4.4)
Alpha 1: 0.2 g/dL (ref 0.0–0.4)
Alpha 2: 0.7 g/dL (ref 0.4–1.0)
Angio Convert Enzyme: 11 U/L — ABNORMAL LOW (ref 14–82)
Anti Nuclear Antibody (ANA): NEGATIVE
Beta: 1 g/dL (ref 0.7–1.3)
Gamma Globulin: 0.9 g/dL (ref 0.4–1.8)
Globulin, Total: 2.9 g/dL (ref 2.2–3.9)
Rheumatoid fact SerPl-aCnc: <10 IU/mL (ref 0.0–13.9)
Sed Rate: 14 mm/hr (ref 0–30)
TSH: 2.47 u[IU]/mL (ref 0.450–4.500)
Total Protein: 6.8 g/dL (ref 6.0–8.5)
Vit D, 25-Hydroxy: 114 ng/mL — ABNORMAL HIGH (ref 30.0–100.0)
Vitamin B-12: 888 pg/mL (ref 232–1245)

## 2019-10-13 MED FILL — metFORMIN HCL ER 500 MG TB2: 500 | 30 days supply | Qty: 120 | Fill #2

## 2019-10-21 MED FILL — LISINOPRIL 10 MG TABS: 10 | 30 days supply | Qty: 30 | Fill #3

## 2019-10-22 ENCOUNTER — Encounter: Payer: Medicaid Other | Admitting: Diagnostic Neuroimaging

## 2019-10-22 ENCOUNTER — Other Ambulatory Visit: Payer: Self-pay

## 2019-10-22 ENCOUNTER — Ambulatory Visit (INDEPENDENT_AMBULATORY_CARE_PROVIDER_SITE_OTHER): Payer: Medicaid Other | Admitting: Diagnostic Neuroimaging

## 2019-10-22 DIAGNOSIS — M5432 Sciatica, left side: Secondary | ICD-10-CM

## 2019-10-22 DIAGNOSIS — E1342 Other specified diabetes mellitus with diabetic polyneuropathy: Secondary | ICD-10-CM

## 2019-10-22 DIAGNOSIS — M792 Neuralgia and neuritis, unspecified: Secondary | ICD-10-CM

## 2019-10-22 DIAGNOSIS — R200 Anesthesia of skin: Secondary | ICD-10-CM

## 2019-10-22 DIAGNOSIS — Z0289 Encounter for other administrative examinations: Secondary | ICD-10-CM

## 2019-10-22 NOTE — Progress Notes (Signed)
Kindly inform the patient that all lab work for reversible causes of neuropathy was unremarkable

## 2019-10-23 ENCOUNTER — Encounter: Payer: Self-pay | Admitting: Orthopaedic Surgery

## 2019-10-23 ENCOUNTER — Other Ambulatory Visit: Payer: Self-pay

## 2019-10-23 ENCOUNTER — Ambulatory Visit (INDEPENDENT_AMBULATORY_CARE_PROVIDER_SITE_OTHER): Payer: Medicaid Other | Admitting: Orthopaedic Surgery

## 2019-10-23 VITALS — BP 158/91 | HR 100 | Ht 74.0 in | Wt 255.0 lb

## 2019-10-23 DIAGNOSIS — M4722 Other spondylosis with radiculopathy, cervical region: Secondary | ICD-10-CM

## 2019-10-23 DIAGNOSIS — M542 Cervicalgia: Secondary | ICD-10-CM | POA: Diagnosis not present

## 2019-10-23 NOTE — Progress Notes (Signed)
Office Visit Note   Patient: Gerald Olson           Date of Birth: August 16, 1966           MRN: [id number] Visit Date: 10/23/2019              Requested by: Antony Blackbird, MD [place],  [place] [address] PCP: Antony Blackbird, MD   Assessment & Plan: Visit Diagnoses:  1. Neck pain   2. Other spondylosis with radiculopathy, cervical region     Plan: Patient has cervical spondylosis and polyneuropathy likely related diabetes.  We will proceed with cervical MRI scan to make sure he does not have significant compression for his left greater than right arm symptoms.  Office follow-up after scan for review.  Follow-Up Instructions: F/U after cervical MRI scan.   Orders:  Orders Placed This Encounter  Procedures  . MR Cervical Spine w/o contrast   No orders of the defined types were placed in this encounter.     Procedures: No procedures performed   Clinical Data: No additional findings.   Subjective: Chief Complaint  Patient presents with  . Neck - Follow-up    HPI 54 year old male returns with ongoing problems with neck pain much worse than back pain.  States his diabetes has been better with improvement in his A1c to 7.6.  He states recently one of his medications had to be changed and he is concerned that may not be as good as when it was checked on 10/01/2019 at 7.6.  Additional problems and diabetes hypertension obesity.  He has been seen by neurology since I saw him with appointment date 10/08/2019.  He has had problems with gait imbalance history of falls and severe neck pain.  Diagnosis was likely worsening diabetic peripheral neuropathy due to uncontrolled diabetes.  Goal for his A1c is less than 6.5.  Is recommended patient use a cane which he has not has been using.  Review of Systems positive for numbness and tingling frequent falling gait disturbance neck pain worsened back pain left arm greater than the right arm pain and  numbness.   Objective: Vital Signs: BP (!) 158/91   Pulse 100   Ht 6\' 2"  (1.88 m)   Wt 255 lb (115.7 kg)   BMI 32.74 kg/m   Physical Exam Constitutional:      Appearance: He is well-developed.  HENT:     Head: Normocephalic and atraumatic.  Eyes:     Pupils: Pupils are equal, round, and reactive to light.  Neck:     Thyroid: No thyromegaly.     Trachea: No tracheal deviation.  Cardiovascular:     Rate and Rhythm: Normal rate.  Pulmonary:     Effort: Pulmonary effort is normal.     Breath sounds: No wheezing.  Abdominal:     General: Bowel sounds are normal.     Palpations: Abdomen is soft.  Skin:    General: Skin is warm and dry.     Capillary Refill: Capillary refill takes less than 2 seconds.  Neurological:     Mental Status: He is alert and oriented to person, place, and time.  Psychiatric:        Behavior: Behavior normal.        Thought Content: Thought content normal.        Judgment: Judgment normal.     Ortho Exam patient had decreased sensation bilateral ankles and feet.  No active plantar foot lesions.  He has brachial plexus tenderness worse on the left than right.  Biceps triceps brachial radialis reflexes are 2 plus  .  Patient somewhat slow getting from sitting to standing position.  Ambulates in a stooped position.  Broad-based gait.  Patient has absent knee and ankle jerk.  Negative Babinski.  Specialty Comments:  No specialty comments available.  Imaging: No results found.   PMFS History: Patient Active Problem List   Diagnosis Date Noted  . Other spondylosis with radiculopathy, cervical region 09/22/2019  . Low back pain 07/28/2019  . Essential hypertension, benign 01/28/2019  . Alcoholic cirrhosis of liver ([place]) 06/02/2018  . Uncontrolled type 2 diabetes mellitus with hyperglycemia ([place]) 04/25/2018  . Alcohol use disorder, mild, abuse 04/25/2018   Past Medical History:  Diagnosis Date  . Calculus of gallbladder without cholecystitis  without obstruction   . Chronic pain    back  . Disc degeneration   . Disc degeneration, lumbar   . Headache   . Hypertension     Family History  Problem Relation Age of Onset  . Aneurysm Father     Past Surgical History:  Procedure Laterality Date  . CHOLECYSTECTOMY N/A 06/02/2018   Procedure: LAPAROSCOPIC CHOLECYSTECTOMY;  Surgeon: Vickie Epley, MD;  Location: [facility] ORS;  Service: General;  Laterality: N/A;  . KNEE ARTHROSCOPY     30 Years ago   Social History   Occupational History  . Not on file  Tobacco Use  . Smoking status: Former Research scientist (life sciences)  . Smokeless tobacco: Never Used  Substance and Sexual Activity  . Alcohol use: Yes    Comment: cut back  . Drug use: No  . Sexual activity: Yes

## 2019-10-28 NOTE — Procedures (Signed)
[facility]  NCS (NERVE CONDUCTION STUDY) WITH EMG (ELECTROMYOGRAPHY) REPORT   STUDY DATE: 10/27/19 PATIENT NAME: Gerald Olson DOB: 1966-04-06 MRN: [id number]  ORDERING CLINICIAN: Antony Contras, MD   TECHNOLOGIST: Sherre Scarlet ELECTROMYOGRAPHER: Earlean Polka. Penumalli, MD  CLINICAL INFORMATION: 54 year old male with severe low back pain and bilateral foot numbness.  FINDINGS: NERVE CONDUCTION STUDY:  Left ulnar motor response has normal distal latency, normal amplitude, slow conduction velocity with stimulation above the elbow.  Bilateral peroneal and left tibial motor responses have decreased amplitudes and slow conduction velocities.  Left peroneal motor responses prolonged distally, decreased amplitude and slow conduction velocity.  Right tibial motor response has normal amplitude and slow conduction velocity.  Left ulnar and right sural sensory responses are normal.  Left sural and bilateral superficial peroneal signs responses have prolonged peak latencies and decreased amplitudes.  Bilateral tibial and left ulnar F wave latencies are prolonged.  Left tibial F-wave latency is significantly prolonged.   NEEDLE ELECTROMYOGRAPHY:  Needle examination of left vastus medialis, tibialis anterior and gastrocnemius is normal.  Left lumbar paraspinal muscles have postoperative discharges and 1+ fibrillation and positive sharp waves at rest.   IMPRESSION:   Abnormal study demonstrating: - Widespread axonal sensorimotor polyneuropathy, likely related to underlying diabetes. - Probable superimposed left lumbar radiculopathy (L5-S1). 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    INTERPRETING PHYSICIAN:  Penni Bombard, MD Certified in Neurology, Neurophysiology and Neuroimaging  [facility] [address], [place], [place] [address] [phone]   [facility]    Nerve / Sites Muscle Latency Ref. Amplitude Ref. Rel Amp  Segments Distance Velocity Ref. Area    ms ms mV mV %  cm m/s m/s mVms  L Ulnar - ADM     Wrist ADM 2.9 ?3.3 9.6 ?6.0 100 Wrist - ADM 7   20.8     B.Elbow ADM 7.9  3.5  37 B.Elbow - Wrist 25 50 ?49 9.1     A.Elbow ADM 10.5  3.3  93.1 A.Elbow - B.Elbow 10 39 ?49 9.0         A.Elbow - Wrist      R Peroneal - EDB     Ankle EDB 6.5 ?6.5 0.3 ?2.0 100 Ankle - EDB 9   1.5     Fib head EDB 17.6  0.4  125 Fib head - Ankle 33 30 ?44 1.6     Pop fossa EDB 20.9  0.4  101 Pop fossa - Fib head 10 30 ?44 1.5         Pop fossa - Ankle      L Peroneal - EDB     Ankle EDB 7.0 ?6.5 1.4 ?2.0 100 Ankle - EDB 9   6.0     Fib head EDB 16.7  1.5  108 Fib head - Ankle 33 34 ?44 5.3     Pop fossa EDB 19.8  1.6  104 Pop fossa - Fib head 10 32 ?44 5.2         Pop fossa - Ankle      R Tibial - AH     Ankle AH 5.4 ?5.8 6.6 ?4.0 100 Ankle - AH 9   13.0     Pop fossa AH 17.1  3.8  57.5 Pop fossa - Ankle 43 37 ?41 12.3  L Tibial - AH     Ankle AH 5.2 ?5.8 0.8 ?4.0 100 Ankle - AH 9  5.9     Pop fossa AH 19.1  0.7  78.6 Pop fossa - Ankle 43 31 ?41 4.0                SNC    Nerve / Sites Rec. Site Peak Lat Ref.  Amp Ref. Segments Distance    ms ms V V  cm  R Sural - Ankle (Calf)     Calf Ankle 4.0 ?4.4 6 ?6 Calf - Ankle 14  L Sural - Ankle (Calf)     Calf Ankle 4.9 ?4.4 2 ?6 Calf - Ankle 14  R Superficial peroneal - Ankle     Lat leg Ankle 5.3 ?4.4 1 ?6 Lat leg - Ankle 14  L Superficial peroneal - Ankle     Lat leg Ankle 5.3 ?4.4 2 ?6 Lat leg - Ankle 14  L Ulnar - Orthodromic, (Dig V, Mid palm)     Dig V Wrist 2.9 ?3.1 6 ?5 Dig V - Wrist 74               F  Wave    Nerve F Lat Ref.   ms ms  R Tibial - AH 68.2 ?56.0  L Tibial - AH 91.0 ?56.0  L Ulnar - ADM 34.7 ?32.0           EMG Summary Table    Spontaneous MUAP Recruitment  Muscle IA Fib PSW Fasc Other Amp Dur. Poly Pattern  L. Vastus medialis Normal None None None _______ Normal Normal Normal Normal  L. Tibialis anterior Normal None None None  _______ Normal Normal Normal Normal  L. Gastrocnemius (Medial head) Normal None None None _______ Normal Normal Normal Normal  L. Lumbar paraspinals Normal 1+ 1+ None CRDs Normal Normal Normal Normal

## 2019-10-28 NOTE — Progress Notes (Signed)
Kindly inform the patient that nerve conduction EMG study confirms diabetic peripheral neuropathy.

## 2019-10-29 ENCOUNTER — Telehealth: Payer: Self-pay

## 2019-10-29 NOTE — Telephone Encounter (Signed)
I reached out to the pt and advised of results. He verbalized understanding.  

## 2019-11-21 ENCOUNTER — Other Ambulatory Visit: Payer: Self-pay

## 2019-11-21 ENCOUNTER — Ambulatory Visit
Admission: RE | Admit: 2019-11-21 | Discharge: 2019-11-21 | Disposition: A | Discharge status: Home / Self Care | Payer: Medicaid Other | Source: Ambulatory Visit | Attending: Orthopaedic Surgery | Admitting: Orthopaedic Surgery

## 2019-11-21 DIAGNOSIS — M542 Cervicalgia: Secondary | ICD-10-CM

## 2019-11-21 DIAGNOSIS — M4802 Spinal stenosis, cervical region: Secondary | ICD-10-CM | POA: Diagnosis not present

## 2019-12-09 ENCOUNTER — Ambulatory Visit: Payer: Medicaid Other | Admitting: Family Medicine

## 2019-12-16 ENCOUNTER — Other Ambulatory Visit: Payer: Self-pay

## 2019-12-16 ENCOUNTER — Ambulatory Visit: Payer: Medicaid Other | Attending: Family Medicine | Admitting: Physician Assistant

## 2019-12-16 DIAGNOSIS — I10 Essential (primary) hypertension: Secondary | ICD-10-CM

## 2019-12-16 DIAGNOSIS — G894 Chronic pain syndrome: Secondary | ICD-10-CM | POA: Diagnosis not present

## 2019-12-16 DIAGNOSIS — E1165 Type 2 diabetes mellitus with hyperglycemia: Secondary | ICD-10-CM | POA: Diagnosis not present

## 2019-12-16 DIAGNOSIS — R413 Other amnesia: Secondary | ICD-10-CM | POA: Diagnosis not present

## 2019-12-16 DIAGNOSIS — M5412 Radiculopathy, cervical region: Secondary | ICD-10-CM

## 2019-12-16 MED ORDER — INSULIN GLARGINE 100 UNITS/ML SOLOSTAR PEN: 15.0000 [IU] | PEN_INJECTOR | Freq: Every day | SUBCUTANEOUS | 11 refills | Status: DC

## 2019-12-16 MED ORDER — METHOCARBAMOL 500 MG PO TABS: 1000.0000 mg | ORAL_TABLET | Freq: Four times a day (QID) | ORAL | 0 refills | Status: DC | PRN

## 2019-12-16 MED ORDER — CELECOXIB 200 MG PO CAPS: 200.0000 mg | ORAL_CAPSULE | Freq: Two times a day (BID) | ORAL | 1 refills | Status: DC

## 2019-12-16 MED ORDER — METFORMIN HCL ER 500 MG PO TB24: 1000.0000 mg | ORAL_TABLET | Freq: Two times a day (BID) | ORAL | 2 refills | Status: DC

## 2019-12-16 MED ORDER — LISINOPRIL 20 MG PO TABS: 20.0000 mg | ORAL_TABLET | Freq: Every day | ORAL | 1 refills | Status: DC

## 2019-12-16 MED ORDER — ACCU-CHEK GUIDE VI STRP: ORAL_STRIP | 12 refills | Status: DC

## 2019-12-16 MED ORDER — GABAPENTIN 300 MG PO CAPS: 300.0000 mg | ORAL_CAPSULE | Freq: Three times a day (TID) | ORAL | 2 refills | Status: DC

## 2019-12-16 MED FILL — metFORMIN HCL ER 500 MG TB2: 500 | 30 days supply | Qty: 120 | Fill #0

## 2019-12-16 MED FILL — CELECOXIB 200 MG CAPSULE: 200 | 30 days supply | Qty: 60 | Fill #0

## 2019-12-16 MED FILL — LANTUS SOLOSTAR 100 UNITS/M: 100 | 20 days supply | Qty: 3 | Fill #0

## 2019-12-16 MED FILL — ACCU-CHEK GUIDE TEST STRIP: 30 days supply | Qty: 100 | Fill #0

## 2019-12-16 MED FILL — LISINOPRIL 20 MG TABLET: 20 | 30 days supply | Qty: 30 | Fill #0

## 2019-12-16 MED FILL — GABAPENTIN 300 MG CAPSULE: 300 | 30 days supply | Qty: 90 | Fill #0

## 2019-12-16 MED FILL — METHOCARBAMOL 500 MG TABS: 500 | 11 days supply | Qty: 90 | Fill #0

## 2019-12-16 NOTE — Progress Notes (Signed)
DM f/u  265-300 cbg from home   Med refills

## 2019-12-16 NOTE — Progress Notes (Signed)
Patient ID: Gerald Olson, male   DOB: 02-18-1966, 54 y.o.   MRN: [id number] Virtual Visit via Telephone Note  I connected with Gerald Olson on 12/16/19 at  2:50 PM EDT by telephone and verified that I am speaking with the correct person using two identifiers.   I discussed the limitations, risks, security and privacy concerns of performing an evaluation and management service by telephone and the availability of in person appointments. I also discussed with the patient that there may be a patient responsible charge related to this service. The patient expressed understanding and agreed to proceed.  PATIENT visit by telephone virtually in the context of Covid-19 pandemic. Patient location:  Home  My Location:  [place] office Persons on the call:  Me and the patient  History of Present Illness: Blood sugars running about 250-300.  Out of metformin.  Not able to take victoza any more.  Talks at length about surgery, delaying surgery, chronic pain, etc.  Requests more muscle relaxer and celebrex for pain.  Still drinking red wine but says he is doing keto?  Also requesting neurology appt due to memory issues-forgot his mom's bday.  Needs rf on other meds too   Observations/Objective:  NAD.  Difficult to follow his thought process.  He is A&Ox3   Assessment and Plan: 1. Uncontrolled type 2 diabetes mellitus with hyperglycemia (HCC) Resume metformin and start lantus.  Check blood sugars bid and record and bring to next visit - insulin glargine (LANTUS) 100 unit/mL SOPN; Inject 0.15 mLs (15 Units total) into the skin daily.  Dispense: 15 mL; Refill: 11 - glucose blood (ACCU-CHEK GUIDE) test strip; Use as instructed to check blood sugar up to 3 times daily.  Dispense: 100 each; Refill: 12 - metFORMIN (GLUCOPHAGE-XR) 500 MG 24 hr tablet; Take 2 tablets (1,000 mg total) by mouth 2 (two) times daily with a meal.  Dispense: 120 tablet; Refill: 2  2. Uncontrolled hypertension Resume.  - lisinopril  (ZESTRIL) 20 MG tablet; Take 1 tablet (20 mg total) by mouth daily.  Dispense: 90 tablet; Refill: 1 Avoid alcohol  3. Chronic pain disorder - methocarbamol (ROBAXIN) 500 MG tablet; Take 2 tablets (1,000 mg total) by mouth every 6 (six) hours as needed for muscle spasms.  Dispense: 90 tablet; Refill: 0 - celecoxib (CELEBREX) 200 MG capsule; Take 1 capsule (200 mg total) by mouth 2 (two) times daily.  Dispense: 60 capsule; Refill: 1  4. Cervical radiculopathy Needs to follow with MD planning to do his surgery - methocarbamol (ROBAXIN) 500 MG tablet; Take 2 tablets (1,000 mg total) by mouth every 6 (six) hours as needed for muscle spasms.  Dispense: 90 tablet; Refill: 0 - celecoxib (CELEBREX) 200 MG capsule; Take 1 capsule (200 mg total) by mouth 2 (two) times daily.  Dispense: 60 capsule; Refill: 1  5. Memory loss - Ambulatory referral to Neurology  Follow Up Instructions: See PCP in 1 month   I discussed the assessment and treatment plan with the patient. The patient was provided an opportunity to ask questions and all were answered. The patient agreed with the plan and demonstrated an understanding of the instructions.   The patient was advised to call back or seek an in-person evaluation if the symptoms worsen or if the condition fails to improve as anticipated.  I provided 15 minutes of non-face-to-face time during this encounter.   Freeman Caldron, PA-C

## 2019-12-18 ENCOUNTER — Encounter: Payer: Self-pay | Admitting: Neurology

## 2019-12-22 DIAGNOSIS — H3562 Retinal hemorrhage, left eye: Secondary | ICD-10-CM | POA: Diagnosis not present

## 2019-12-22 DIAGNOSIS — H2513 Age-related nuclear cataract, bilateral: Secondary | ICD-10-CM | POA: Diagnosis not present

## 2019-12-22 DIAGNOSIS — E113293 Type 2 diabetes mellitus with mild nonproliferative diabetic retinopathy without macular edema, bilateral: Secondary | ICD-10-CM | POA: Diagnosis not present

## 2019-12-22 DIAGNOSIS — H538 Other visual disturbances: Secondary | ICD-10-CM | POA: Diagnosis not present

## 2019-12-22 DIAGNOSIS — H35711 Central serous chorioretinopathy, right eye: Secondary | ICD-10-CM | POA: Diagnosis not present

## 2019-12-22 LAB — HM DIABETES EYE EXAM

## 2019-12-30 ENCOUNTER — Encounter (INDEPENDENT_AMBULATORY_CARE_PROVIDER_SITE_OTHER): Payer: Medicaid Other | Admitting: Ophthalmology

## 2019-12-30 DIAGNOSIS — H353131 Nonexudative age-related macular degeneration, bilateral, early dry stage: Secondary | ICD-10-CM | POA: Diagnosis not present

## 2019-12-30 DIAGNOSIS — E11319 Type 2 diabetes mellitus with unspecified diabetic retinopathy without macular edema: Secondary | ICD-10-CM | POA: Diagnosis not present

## 2019-12-30 DIAGNOSIS — I10 Essential (primary) hypertension: Secondary | ICD-10-CM | POA: Diagnosis not present

## 2019-12-30 DIAGNOSIS — H35033 Hypertensive retinopathy, bilateral: Secondary | ICD-10-CM | POA: Diagnosis not present

## 2019-12-30 DIAGNOSIS — E113293 Type 2 diabetes mellitus with mild nonproliferative diabetic retinopathy without macular edema, bilateral: Secondary | ICD-10-CM | POA: Diagnosis not present

## 2019-12-30 DIAGNOSIS — H43813 Vitreous degeneration, bilateral: Secondary | ICD-10-CM | POA: Diagnosis not present

## 2020-01-11 ENCOUNTER — Ambulatory Visit: Payer: Medicaid Other | Admitting: Neurology

## 2020-01-20 ENCOUNTER — Other Ambulatory Visit: Payer: Self-pay

## 2020-01-20 ENCOUNTER — Ambulatory Visit (INDEPENDENT_AMBULATORY_CARE_PROVIDER_SITE_OTHER): Payer: Medicaid Other | Admitting: Urology

## 2020-01-20 ENCOUNTER — Encounter: Payer: Self-pay | Admitting: Urology

## 2020-01-20 VITALS — BP 162/84 | HR 79 | Ht 74.0 in | Wt 242.0 lb

## 2020-01-20 DIAGNOSIS — N401 Enlarged prostate with lower urinary tract symptoms: Secondary | ICD-10-CM | POA: Diagnosis not present

## 2020-01-20 DIAGNOSIS — R3129 Other microscopic hematuria: Secondary | ICD-10-CM

## 2020-01-20 DIAGNOSIS — N138 Other obstructive and reflux uropathy: Secondary | ICD-10-CM

## 2020-01-20 DIAGNOSIS — R3912 Poor urinary stream: Secondary | ICD-10-CM

## 2020-01-20 LAB — MICROSCOPIC EXAMINATION: Bacteria, UA: NONE SEEN

## 2020-01-20 LAB — URINALYSIS, COMPLETE
Bilirubin, UA: NEGATIVE
Leukocytes,UA: NEGATIVE
Nitrite, UA: NEGATIVE
Specific Gravity, UA: >1.03 — ABNORMAL HIGH (ref 1.005–1.030)
Urobilinogen, Ur: 0.2 mg/dL (ref 0.2–1.0)
pH, UA: 6 (ref 5.0–7.5)

## 2020-01-20 NOTE — Progress Notes (Signed)
01/20/2020 3:11 PM   Gerald Olson [birth year] [id number]  Referring provider: Antony Blackbird, MD [place],  [place] [address]  Chief Complaint  Patient presents with  . Other    HPI:  [name] is a 54 year old he has noc x 3. He had blood on a dip and initial UA at [facility] without hematuria. No gross hematuria. He had gallbladder surgery and changes in his urine. Different color and smell of the urine. He takes apple cider vinegar. He snores. No Gu hx or surgery. His PSA was 0.29 Oct 2018.No FH PCa. PVR was 3 ml. He is claiming disability because of DDD.He underwent renal ultrasound May 2020. Kidneys appeared normal. Prostate measured 35 g. Cystoscopy benign June 2020. He notices a strange smell to his urine. Cystoscopy was benign. PVR 3 ml. NG risk includes DDD - severe (disability).    [name] returns. UA with 3-10 rbc. Trace protein. Urine now "skunk and burnt rubber". He's had some weak stream and starting and stopping. He has DDD and numbness and tingling in his legs. Last Cr 0.95.   PMH: Past Medical History:  Diagnosis Date  . Calculus of gallbladder without cholecystitis without obstruction   . Chronic pain    back  . Disc degeneration   . Disc degeneration, lumbar   . Headache   . Hypertension     Surgical History: Past Surgical History:  Procedure Laterality Date  . CHOLECYSTECTOMY N/A 06/02/2018   Procedure: LAPAROSCOPIC CHOLECYSTECTOMY;  Surgeon: Vickie Epley, MD;  Location: [facility] ORS;  Service: General;  Laterality: N/A;  . KNEE ARTHROSCOPY     30 Years ago    Home Medications:  Allergies as of 01/20/2020      Reactions   Hydrocodone Itching   Itching, face tingling, dizziness.   Oxycontin [oxycodone Hcl] Itching      Medication List       Accurate as of Jan 20, 2020  3:11 PM. If you have any questions, ask your nurse or doctor.        Accu-Chek FastClix Lancets Misc Use as instructed to check blood sugar up to 3 times daily.     Accu-Chek Guide Me w/Device Kit 1 kit by Does not apply route 3 (three) times daily.   Accu-Chek Guide test strip Generic drug: glucose blood Use as instructed to check blood sugar up to 3 times daily.   Ashwagandha 500 MG Caps Take 1 capsule by mouth 3 (three) times daily.   BLACK CURRANT SEED OIL PO Take by mouth.   celecoxib 200 MG capsule Commonly known as: CeleBREX Take 1 capsule (200 mg total) by mouth 2 (two) times daily.   Cinnamon 500 MG capsule Take 1,000 mg by mouth 2 (two) times daily.   DHEA PO Take 100 mg by mouth daily.   diclofenac sodium 1 % Gel Commonly known as: VOLTAREN Apply 1 application topically 3 (three) times daily as needed (pain).   gabapentin 300 MG capsule Commonly known as: Neurontin Take 1 capsule (300 mg total) by mouth 3 (three) times daily.   insulin glargine 100 unit/mL Sopn Commonly known as: LANTUS Inject 0.15 mLs (15 Units total) into the skin daily.   lisinopril 20 MG tablet Commonly known as: ZESTRIL Take 1 tablet (20 mg total) by mouth daily.   metFORMIN 500 MG 24 hr tablet Commonly known as: GLUCOPHAGE-XR Take 2 tablets (1,000 mg total) by mouth 2 (two) times daily with a meal.   methocarbamol 500 MG  tablet Commonly known as: ROBAXIN Take 2 tablets (1,000 mg total) by mouth every 6 (six) hours as needed for muscle spasms.   multivitamin with minerals tablet Take 1 tablet by mouth daily.   naproxen sodium 220 MG tablet Commonly known as: ALEVE Take 660 mg by mouth daily as needed (pain).   ondansetron 4 MG tablet Commonly known as: Zofran Take 1 tablet (4 mg total) by mouth every 8 (eight) hours as needed for nausea or vomiting.       Allergies:  Allergies  Allergen Reactions  . Hydrocodone Itching    Itching, face tingling, dizziness.  [name] [Oxycodone Hcl] Itching    Family History: Family History  Problem Relation Age of Onset  . Aneurysm Father     Social History:  reports that he has quit  smoking. He has never used smokeless tobacco. He reports current alcohol use. He reports that he does not use drugs.   Physical Exam: BP (!) 162/84   Pulse 79   Ht _0  (1.88 m)   Wt 242 lb (109.8 kg)   BMI 31.07 kg/m   Constitutional:  Alert and oriented, No acute distress. HEENT:  AT, moist mucus membranes.  Trachea midline, no masses. Cardiovascular: No clubbing, cyanosis, or edema. Respiratory: Normal respiratory effort, no increased work of breathing. GI: Abdomen is soft, nontender, nondistended, no abdominal masses GU: No CVA tenderness Skin: No rashes, bruises or suspicious lesions. Neurologic: Grossly intact, no focal deficits, moving all 4 extremities. Psychiatric: Normal mood and affect.  Laboratory Data: Lab Results  Component Value Date   WBC 7.3 06/02/2018   HGB 15.3 06/02/2018   HCT 44.9 06/02/2018   MCV 84.5 06/02/2018   PLT 252 06/02/2018    Lab Results  Component Value Date   CREATININE 0.95 10/01/2019    No results found for: PSA  No results found for: TESTOSTERONE  Lab Results  Component Value Date   HGBA1C 7.6 (H) 10/01/2019    Urinalysis    Component Value Date/Time   APPEARANCEUR Clear 07/29/2019 1616   GLUCOSEU 2+ (A) 07/29/2019 1616   BILIRUBINUR Negative 07/29/2019 1616   KETONESUR negative 10/06/2018 1455   PROTEINUR Negative 07/29/2019 1616   UROBILINOGEN 0.2 10/06/2018 1455   NITRITE Negative 07/29/2019 1616   LEUKOCYTESUR Negative 07/29/2019 1616    Lab Results  Component Value Date   LABMICR See below: 07/29/2019   WBCUA 0-5 07/29/2019   LABEPIT None seen 07/29/2019   BACTERIA None seen 07/29/2019    Pertinent Imaging: [place]  No results found for this or any previous visit. No results found for this or any previous visit. No results found for this or any previous visit. No results found for this or any previous visit. Results for orders placed during the hospital encounter of 01/23/19  US RENAL   Narrative CLINICAL  DATA:  54 year old male with abnormal urinalysis and nocturia  EXAM: RENAL / URINARY TRACT ULTRASOUND COMPLETE  COMPARISON:  None.  FINDINGS: Right Kidney:  Renal measurements: 11.8 x 7.1 x 8.0 cm = volume: 349 mL. Mildly echogenic renal parenchyma with increased conspicuity of the corticomedullary interface. No evidence of hydronephrosis, nephrolithiasis or solid mass.  Left Kidney:  Renal measurements: 12.3 x 7.2 x 7.6 cm = volume: 352 mL. Mildly echogenic renal parenchyma with increased conspicuity of the corticomedullary interface. No evidence of hydronephrosis, nephrolithiasis or solid mass.  Bladder:  Appears normal for degree of bladder distention. Mildly enlarged prostate gland measuring 4.7 x 3.3 x  4.3 cm.  IMPRESSION: 1. Echogenic renal parenchyma suggests underlying medical renal disease. 2. No evidence of hydronephrosis, nephrolithiasis or solid renal mass. 3. Mildly enlarged prostate gland estimated at 35 g. 4. No discrete bladder abnormality.   Electronically Signed   By: Jacqulynn Cadet M.D.   On: 01/29/2019 09:58    No results found for this or any previous visit. No results found for this or any previous visit. No results found for this or any previous visit.  Assessment & Plan:    1. Microhematuria He hasn't had formal cross sectional imaging and is having more weak and intermittent stream. To be certain, I think it's worth a CT with IV and cystoscopy. He elects to proceed.  - Urinalysis, Complete  2. BPH with LUTS  - eval as above. PSA was sent.    No follow-ups on file.  Festus Aloe, MD  [facility] [address], [place], [place] [address] [phone]

## 2020-01-20 NOTE — Patient Instructions (Signed)
Hematuria, Adult Hematuria is blood in the urine. Blood may be visible in the urine, or it may be identified with a test. This condition can be caused by infections of the bladder, urethra, kidney, or prostate. Other possible causes include:  Kidney stones.  Cancer of the urinary tract.  Too much calcium in the urine.  Conditions that are passed from parent to child (inherited conditions).  Exercise that requires a lot of energy. Infections can usually be treated with medicine, and a kidney stone usually will pass through your urine. If neither of these is the cause of your hematuria, more tests may be needed to identify the cause of your symptoms. It is very important to tell your health care provider about any blood in your urine, even if it is painless or the blood stops without treatment. Blood in the urine, when it happens and then stops and then happens again, can be a symptom of a very serious condition, including cancer. There is no pain in the initial stages of many urinary cancers. Follow these instructions at home: Medicines  Take over-the-counter and prescription medicines only as told by your health care provider.  If you were prescribed an antibiotic medicine, take it as told by your health care provider. Do not stop taking the antibiotic even if you start to feel better. Eating and drinking  Drink enough fluid to keep your urine clear or pale yellow. It is recommended that you drink 3-4 quarts (2.8-3.8 L) a day. If you have been diagnosed with an infection, it is recommended that you drink cranberry juice in addition to large amounts of water.  Avoid caffeine, tea, and carbonated beverages. These tend to irritate the bladder.  Avoid alcohol because it may irritate the prostate (men). General instructions  If you have been diagnosed with a kidney stone, follow your health care provider's instructions about straining your urine to catch the stone.  Empty your bladder  often. Avoid holding urine for long periods of time.  If you are male: ? After a bowel movement, wipe from front to back and use each piece of toilet paper only once. ? Empty your bladder before and after sex.  Pay attention to any changes in your symptoms. Tell your health care provider about any changes or any new symptoms.  It is your responsibility to get your test results. Ask your health care provider, or the department performing the test, when your results will be ready.  Keep all follow-up visits as told by your health care provider. This is important. Contact a health care provider if:  You develop back pain.  You have a fever.  You have nausea or vomiting.  Your symptoms do not improve after 3 days.  Your symptoms get worse. Get help right away if:  You develop severe vomiting and are unable take medicine without vomiting.  You develop severe pain in your back or abdomen even though you are taking medicine.  You pass a large amount of blood in your urine.  You pass blood clots in your urine.  You feel very weak or like you might faint.  You faint. Summary  Hematuria is blood in the urine. It has many possible causes.  It is very important that you tell your health care provider about any blood in your urine, even if it is painless or the blood stops without treatment.  Take over-the-counter and prescription medicines only as told by your health care provider.  Drink enough fluid to keep   your urine clear or pale yellow. This information is not intended to replace advice given to you by your health care provider. Make sure you discuss any questions you have with your health care provider. Document Revised: 01/07/2019 Document Reviewed: 09/15/2016 Elsevier Patient Education  2020 [employer]vier Inc.  

## 2020-01-21 ENCOUNTER — Telehealth: Payer: Self-pay | Admitting: Family Medicine

## 2020-01-21 ENCOUNTER — Telehealth: Payer: Self-pay

## 2020-01-21 LAB — BASIC METABOLIC PANEL
BUN/Creatinine Ratio: 14 (ref 9–20)
BUN: 13 mg/dL (ref 6–24)
CO2: 20 mmol/L (ref 20–29)
Calcium: 9.3 mg/dL (ref 8.7–10.2)
Chloride: 98 mmol/L (ref 96–106)
Creatinine, Ser: 0.9 mg/dL (ref 0.76–1.27)
GFR calc Af Amer: 112 mL/min/{1.73_m2} (ref >59)
GFR calc non Af Amer: 97 mL/min/{1.73_m2} (ref >59)
Glucose: 222 mg/dL — ABNORMAL HIGH (ref 65–99)
Potassium: 4.7 mmol/L (ref 3.5–5.2)
Sodium: 133 mmol/L — ABNORMAL LOW (ref 134–144)

## 2020-01-21 LAB — PSA: Prostate Specific Ag, Serum: 0.2 ng/mL (ref 0.0–4.0)

## 2020-01-21 NOTE — Telephone Encounter (Signed)
Pt returning your call

## 2020-01-21 NOTE — Telephone Encounter (Signed)
LVM to call back to reschedule the appt since PCP is out the office

## 2020-01-22 ENCOUNTER — Ambulatory Visit: Payer: Medicaid Other | Admitting: Family Medicine

## 2020-01-26 ENCOUNTER — Telehealth: Payer: Self-pay

## 2020-01-26 NOTE — Telephone Encounter (Signed)
1) Medication(s) Requested (by name): lisinopril out of for a week & insulin only one week supply left at home & metformin took last dose yesterday  2) Pharmacy of Choice:  [facility]  3) Special Requests:  appt 02/11/20 with Durene Fruits NP.   Approved medications will be sent to the pharmacy, we will reach out if there is an issue.  Requests made after 3pm may not be addressed until the following business day!  If a patient is unsure of the name of the medication(s) please note and ask patient to call back when they are able to provide all info, do not send to responsible party until all information is available!

## 2020-01-27 MED FILL — LANTUS SOLOSTAR 100 UNITS/M: 100 | 20 days supply | Qty: 3 | Fill #2

## 2020-01-27 MED FILL — metFORMIN HCL ER 500 MG TB2: 500 | 30 days supply | Qty: 120 | Fill #1

## 2020-01-27 MED FILL — LISINOPRIL 20 MG TABLET: 20 | 30 days supply | Qty: 30 | Fill #1

## 2020-01-27 NOTE — Telephone Encounter (Signed)
Lisinopril was prescribed 12/16/2019, #90 with 1 RF.  Metformin was prescribed 12/16/2019, # 120 with 2 RF.  Lantus was prescribed 12/16/2019, 15 ML with 11 RF.  Patient still has refills on all requested medications. Patient needs to call [facility] to get refills ready for pickup.

## 2020-01-28 ENCOUNTER — Encounter: Payer: Self-pay | Admitting: Neurology

## 2020-01-28 ENCOUNTER — Ambulatory Visit: Payer: Medicaid Other | Admitting: Neurology

## 2020-01-28 VITALS — BP 140/69 | HR 90 | Ht 74.0 in | Wt 247.6 lb

## 2020-01-28 DIAGNOSIS — G5622 Lesion of ulnar nerve, left upper limb: Secondary | ICD-10-CM

## 2020-01-28 DIAGNOSIS — E1142 Type 2 diabetes mellitus with diabetic polyneuropathy: Secondary | ICD-10-CM | POA: Diagnosis not present

## 2020-01-28 MED ORDER — TOPIRAMATE 25 MG PO TABS
25.0000 mg | ORAL_TABLET | Freq: Every day | ORAL | 2 refills | Status: DC
Start: 2020-01-28 — End: 2020-04-27

## 2020-01-28 MED FILL — TOPIRAMATE 25 MG TABS: 25 | 30 days supply | Qty: 30 | Fill #0

## 2020-01-28 NOTE — Patient Instructions (Addendum)
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  I recommend he discontinue gabapentin as it seems not to be helping him plus weight gain and myoclonic jerks may be possible side effects.  I encouraged him to continue vitamin D replacement regularly and not stop it.  He was advised to sleep with the soft pillow beneath his left elbow at night to avoid further nerve injury to his left ulnar nerve.  He will maintain tight control of diabetes with hemoglobin A1c goal below 7.0.  He will return for follow-up in the future in 3 months with my nurse practitioner Janett Billow or call earlier if necessary.

## 2020-01-28 NOTE — Progress Notes (Signed)
[facility] [address] [place]. [place] [address] [phone]       OFFICE FOLLOW UP VISIT NOTE  [name] Date of Birth:  [birth year] Medical Record Number:  [id number]   Referring MD: Cammie Fulp Reason for Referral: Neuropathy URK:YHCWCBJ visit 10/08/2019 [name] is a 54 year old Caucasian male with past medical history of diabetes, hypertension, obesity, degenerative arthritis and chronic back and neck pain who has been having lower extremity paresthesias for the last 6 months which seem to be progressive.  He complains of constant burning, tingling and pins and needle sensation.  At times this becomes bearable.  This is present throughout the day.  He recently started taking gabapentin 100 mg twice daily which is not being effective.  He is also noticed that his balance is poor and has had frequent falls.  In fact he had a fall yesterday and today seems to be in acute pain and his gait is limited.  He has a longstanding history of degenerative cervical and lumbar spine disease.  He has been seen by orthopedic surgeon wants to operate on his neck but his diabetes is out of control and is working with endocrinologist to bring it under control so he can have the surgery.  Review of electronic medical records show that he had a cervical spine x-ray on 09/22/2019 which showed degenerative changes from C3-C7.  X-rays of lumbar spine on 07/21/2018 show significant facet degenerative changes at L5-S1 with significant foraminal narrowing on the right at the L4-5 and on the left at L5-S1.  He takes tramadol and muscle relaxant Robaxin which helps partially only.  He has not had any EMG nerve conduction studies done or lab work for other reversible causes of neuropathy done.  He states his feet are now constantly numb with lack of feeling which has been contributing to his falling.  His sugars have been persistently high and last A1c on 10/01/2019 was yet high at 7.6 but improved  from 9.1 from 3 months ago. Update 01/28/2020 : He returns for follow-up after last visit 6 months ago.  He states that he did increase the gabapentin but was taking it only twice daily.  It helps with the pins-and-needles sensation but is still in constant pain as well as feels numb.  In the last few weeks he feels symptoms may gotten worse.  Is also gained about 20 pounds this year.  He also complains of involuntary myoclonic jerk-like movements in his left hand.  He had EMG nerve conduction study done on 10/22/2019 by Dr. 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  Lab works for reversible causes of neuropathy on 10/08/2019 pretty much normal except for low vitamin D levels of 114.  Patient states he has known low vitamin D level since last 20 years and was on vitamin D replacement however few weeks prior to seeing me and last visit here ran out of vitamin D and had not restarted taking it due to financial reasons.  He has chronic back pain with left leg radiculopathy and he plans on having back surgery but needs to lose some weight prior to that.  He has been taking while like to switch seems to help his chronic pain.  He has not tried Topamax and is willing to try that.  ROS:   14 system review of systems is positive for back pain, sciatica,  leg pain, tingling, numbness, frequent falls, gait imbalance, neck pain, hand tremor and all other systems negative  PMH:  Past Medical History:  Diagnosis Date  . Calculus of gallbladder without cholecystitis without obstruction   . Chronic pain    back  . Disc degeneration   . Disc degeneration, lumbar   . Headache   . Hypertension     Social History:  Social History   Socioeconomic History  . Marital status: Single    Spouse name: Not on file  . Number of children: Not on file  . Years of education: Not on file  .  Highest education level: Not on file  Occupational History  . Not on file  Tobacco Use  . Smoking status: Former Research scientist (life sciences)  . Smokeless tobacco: Never Used  Substance and Sexual Activity  . Alcohol use: Yes    Comment: cut back  . Drug use: No  . Sexual activity: Yes  Other Topics Concern  . Not on file  Social History Narrative  . Not on file   Social Determinants of Health   Financial Resource Strain:   . Difficulty of Paying Living Expenses:   Food Insecurity:   . Worried About Charity fundraiser in the Last Year:   . Arboriculturist in the Last Year:   Transportation Needs:   . Film/video editor (Medical):   Marland Kitchen Lack of Transportation (Non-Medical):   Physical Activity:   . Days of Exercise per Week:   . Minutes of Exercise per Session:   Stress:   . Feeling of Stress :   Social Connections:   . Frequency of Communication with Friends and Family:   . Frequency of Social Gatherings with Friends and Family:   . Attends Religious Services:   . Active Member of Clubs or Organizations:   . Attends Archivist Meetings:   Marland Kitchen Marital Status:   Intimate Partner Violence:   . Fear of Current or Ex-Partner:   . Emotionally Abused:   Marland Kitchen Physically Abused:   . Sexually Abused:     Medications:   Current Outpatient Medications on File Prior to Visit  Medication Sig Dispense Refill  . Accu-Chek FastClix Lancets MISC Use as instructed to check blood sugar up to 3 times daily. 102 each 11  . Blood Glucose Monitoring Suppl (ACCU-CHEK GUIDE ME) w/Device KIT 1 kit by Does not apply route 3 (three) times daily. 1 kit 0  . celecoxib (CELEBREX) 200 MG capsule Take 1 capsule (200 mg total) by mouth 2 (two) times daily. 60 capsule 1  . Cinnamon 500 MG capsule Take 1,000 mg by mouth 2 (two) times daily.    Marland Kitchen glucose blood (ACCU-CHEK GUIDE) test strip Use as instructed to check blood sugar up to 3 times daily. 100 each 12  . insulin glargine (LANTUS) 100 unit/mL SOPN Inject  0.15 mLs (15 Units total) into the skin daily. 15 mL 11  . lisinopril (ZESTRIL) 20 MG tablet Take 1 tablet (20 mg total) by mouth daily. 90 tablet 1  . metFORMIN (GLUCOPHAGE-XR) 500 MG 24 hr tablet Take 2 tablets (1,000 mg total) by mouth 2 (two) times daily with a meal. 120 tablet 2  . methocarbamol (ROBAXIN) 500 MG tablet Take 2 tablets (1,000 mg total) by mouth every 6 (six) hours as needed for muscle spasms. 90 tablet 0  . Multiple Vitamins-Minerals (MULTIVITAMIN WITH MINERALS) tablet Take 1 tablet by mouth daily.    . naproxen sodium (ALEVE)  220 MG tablet Take 660 mg by mouth daily as needed (pain).    . Nutritional Supplements (DHEA PO) Take 100 mg by mouth daily.    Marland Kitchen OVER THE COUNTER MEDICATION Black seed oil 2000per day    . Ashwagandha 500 MG CAPS Take 1 capsule by mouth 3 (three) times daily.    . diclofenac sodium (VOLTAREN) 1 % GEL Apply 1 application topically 3 (three) times daily as needed (pain).    . ondansetron (ZOFRAN) 4 MG tablet Take 1 tablet (4 mg total) by mouth every 8 (eight) hours as needed for nausea or vomiting. 20 tablet 0   No current facility-administered medications on file prior to visit.    Allergies:   Allergies  Allergen Reactions  . Hydrocodone Itching    Itching, face tingling, dizziness.  [name] [Oxycodone Hcl] Itching    Physical Exam General: Obese middle-aged Caucasian male seated, in no evident distress Head: head normocephalic and atraumatic.   Neck: supple with no carotid or supraclavicular bruits Cardiovascular: regular rate and rhythm, no murmurs Musculoskeletal: Low back pain with tenderness.  Straight leg raising limited bilaterally. Skin:  no rash/petichiae Vascular:  Normal pulses all extremities  Neurologic Exam Mental Status: Awake and fully alert. Oriented to place and time. Recent and remote memory intact. Attention span, concentration and fund of knowledge appropriate. Mood and affect appropriate.  Cranial Nerves:  Fundoscopic exam reveals sharp disc margins. Pupils equal, briskly reactive to light. Extraocular movements full without nystagmus. Visual fields full to confrontation. Hearing intact. Facial sensation intact. Face, tongue, palate moves normally and symmetrically.  Motor: Normal bulk and tone. Normal strength in all tested extremity muscles.  Left upper extremity mild past-pointing and involuntary jerky movements. Sensory.:  Diminished   touch , pinprick sensation in both feet in the stocking areas down.  Impaired vibration in both feet from ankle down.  Position sense mildly impaired.  Romberg sign negative.  Fine action tremor of both outstretched upper extremities left greater than right.  No pill-rolling or cogwheel rigidity. Coordination: Rapid alternating movements normal in all extremities. Finger-to-nose and heel-to-shin performed accurately bilaterally. Gait and Station: Arises from chair with  difficulty. Stance is stooped and favors his back.  And left leg.  Broad-based antalgic gait uses a cane. Reflexes: 1+ and symmetric in upper extremities and absent in both lower extremities. Toes downgoing.       ASSESSMENT: 54 year old Caucasian male with lower extremity paresthesias and pain due to worsening diabetic peripheral neuropathic pain.  He also has chronic back pain and left leg sciatica as well as increasing falls and gait difficulties due to degenerative lumbar and cervical spine disease.     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  I recommend he discontinue gabapentin as it seems not to be helping him plus weight gain and myoclonic jerks may be possible side effects.  I encouraged him to continue vitamin D replacement regularly and not stop it.  He was  advised to sleep with the soft pillow beneath his left elbow at night to avoid further nerve injury to his left ulnar nerve.  He will maintain tight control of diabetes with hemoglobin A1c goal below 7.0.  He will return for follow-up in the future in 3 months with my nurse practitioner Janett Billow or  call earlier if necessary.  Greater than 50% time during this 30-minute  visit was spent on counseling and coordination of care about his neuropathic pain, diabetic neuropathy and answering questions. Antony Contras, MD Note: This document was prepared with digital dictation and possible smart phrase technology. Any transcriptional errors that result from this process are unintentional.

## 2020-01-28 NOTE — Telephone Encounter (Signed)
-----   Message from Evelina Bucy, [place] sent at 01/21/2020 10:52 AM EDT -----  ----- Message ----- From: Chrystie Nose, CMA Sent: 01/21/2020   9:39 AM EDT To: Evelina Bucy, CMA   ----- Message ----- From: Festus Aloe, MD Sent: 01/21/2020   9:33 AM EDT To: Chrystie Nose, CMA  Janett Billow - let Jeanluc know his PSA is normal. BMP showed sodium a little low and sugar a little hightbut nothing worrisome. Kidney function was normal.  ----- Message ----- From: Chrystie Nose, CMA Sent: 01/21/2020   7:31 AM EDT To: Festus Aloe, MD   ----- Message ----- From: Lavone Neri Lab Results In Sent: 01/20/2020   4:38 PM EDT To: Rowe Robert Clinical

## 2020-02-05 ENCOUNTER — Ambulatory Visit: Payer: Medicaid Other | Admitting: Urology

## 2020-02-09 NOTE — Progress Notes (Signed)
Patient ID: Gerald Olson, male    DOB: 1966/02/15  MRN: [id number]  CC: Diabetes Follow-Up  Subjective: Gerald Olson is a 54 y.o. male with history of essential hypertension benign, alcoholic cirrhosis of liver, uncontrolled type 2 diabetes with hyperglycemia, alcohol use disorder mild abuse, and low back pain who presents for diabetes follow-up.  1. DIABETES TYPE 2 FOLLOW-UP:  Last A1C:   Are you fasting today: [x] Yes [] No   Have you taken your anti-diabetic medications today: [x] Yes [] No  Med Adherence:  [x] Yes    [] No Medication side effects:  [x] Yes    [] No   Home Monitoring?  [x] Yes    [] No Home glucose results range: 190s-220s Diet Adherence: [] Yes    [x] No  Exercise:  [x] Not really, sometimes does upper body Hypoglycemic episodes?: [] Yes    [x] No Numbness of the feet? [x] Yes    [] No Retinopathy hx? [] Yes    [x] No Last eye exam: 1 month ago Comments: Last visit 12/16/2019 with physician assistant Thereasa Solo. During that encounter Metformin resumed and Lantus started.   2. HYPERTENSION FOLLOW-UP: Currently taking: see medication list Have you taken your blood pressure medication today: [] Yes [] No  Med Adherence: [x] Yes    [] No Medication side effects: [] Yes    [x] No Adherence with salt restriction: [x] Yes    [] No Exercise: Yes [] No [x] Home Monitoring?: [] Yes    [] No  Monitoring Frequency: [] Yes    [] No Home BP results range: [x] Yes    [] No 130/90 Smoking [x] Yes, vaping, patches, gum SOB? [] Yes    [x] No Chest Pain?: [] Yes    [x] No Leg swelling?: [] Yes    [] No Headaches?: [x] Yes, aspirin helps sometimes  Dizziness? [x] Yes    [] No when feeling fatigued, if stand up too fast Comments: Last visit 12/16/2019 with physician assistant Thereasa Solo. During that encounter Lisinopril resumed.   Patient Active Problem List   Diagnosis Date Noted  . Other spondylosis with radiculopathy, cervical region 09/22/2019  . Low back pain 07/28/2019   . Essential hypertension, benign 01/28/2019  . Alcoholic cirrhosis of liver ([place]) 06/02/2018  . Uncontrolled type 2 diabetes mellitus with hyperglycemia ([place]) 04/25/2018  . Alcohol use disorder, mild, abuse 04/25/2018     Current Outpatient Medications on File Prior to Visit  Medication Sig Dispense Refill  . Accu-Chek FastClix Lancets MISC Use as instructed to check blood sugar up to 3 times daily. 102 each 11  . Ashwagandha 500 MG CAPS Take 1 capsule by mouth 3 (three) times daily.    . Blood Glucose Monitoring Suppl (ACCU-CHEK GUIDE ME) w/Device KIT 1 kit by Does not apply route 3 (three) times daily. 1 kit 0  . celecoxib (CELEBREX) 200 MG capsule Take 1 capsule (200 mg total) by mouth 2 (two) times daily. 60 capsule 1  . Cinnamon 500 MG capsule Take 1,000 mg by mouth 2 (two) times daily.    . diclofenac sodium (VOLTAREN) 1 % GEL Apply 1 application topically 3 (three) times daily as needed (pain).    Marland Kitchen glucose blood (ACCU-CHEK GUIDE) test strip Use as instructed to check blood sugar up to 3 times daily. 100 each 12  . insulin glargine (LANTUS) 100 unit/mL SOPN Inject 0.15 mLs (15 Units total) into the skin daily. 15 mL  11  . lisinopril (ZESTRIL) 20 MG tablet Take 1 tablet (20 mg total) by mouth daily. 90 tablet 1  . metFORMIN (GLUCOPHAGE-XR) 500 MG 24 hr tablet Take 2 tablets (1,000 mg total) by mouth 2 (two) times daily with a meal. 120 tablet 2  . methocarbamol (ROBAXIN) 500 MG tablet Take 2 tablets (1,000 mg total) by mouth every 6 (six) hours as needed for muscle spasms. 90 tablet 0  . Multiple Vitamins-Minerals (MULTIVITAMIN WITH MINERALS) tablet Take 1 tablet by mouth daily.    . naproxen sodium (ALEVE) 220 MG tablet Take 660 mg by mouth daily as needed (pain).    . Nutritional Supplements (DHEA PO) Take 100 mg by mouth daily.    . ondansetron (ZOFRAN) 4 MG tablet Take 1 tablet (4 mg total) by mouth every 8 (eight) hours as needed for nausea or vomiting. 20 tablet 0  . OVER THE  COUNTER MEDICATION Black seed oil 2000per day    . topiramate (TOPAMAX) 25 MG tablet Take 1 tablet (25 mg total) by mouth at bedtime. 30 tablet 2   No current facility-administered medications on file prior to visit.    Allergies  Allergen Reactions  . Hydrocodone Itching    Itching, face tingling, dizziness.  [name] [Oxycodone Hcl] Itching    Social History   Socioeconomic History  . Marital status: Single    Spouse name: Not on file  . Number of children: Not on file  . Years of education: Not on file  . Highest education level: Not on file  Occupational History  . Not on file  Tobacco Use  . Smoking status: Former Research scientist (life sciences)  . Smokeless tobacco: Never Used  Vaping Use  . Vaping Use: Never used  Substance and Sexual Activity  . Alcohol use: Yes    Comment: cut back  . Drug use: No  . Sexual activity: Yes  Other Topics Concern  . Not on file  Social History Narrative  . Not on file   Social Determinants of Health   Financial Resource Strain:   . Difficulty of Paying Living Expenses:   Food Insecurity:   . Worried About Charity fundraiser in the Last Year:   . Arboriculturist in the Last Year:   Transportation Needs:   . Film/video editor (Medical):   Marland Kitchen Lack of Transportation (Non-Medical):   Physical Activity:   . Days of Exercise per Week:   . Minutes of Exercise per Session:   Stress:   . Feeling of Stress :   Social Connections:   . Frequency of Communication with Friends and Family:   . Frequency of Social Gatherings with Friends and Family:   . Attends Religious Services:   . Active Member of Clubs or Organizations:   . Attends Archivist Meetings:   Marland Kitchen Marital Status:   Intimate Partner Violence:   . Fear of Current or Ex-Partner:   . Emotionally Abused:   Marland Kitchen Physically Abused:   . Sexually Abused:     Family History  Problem Relation Age of Onset  . Aneurysm Father     Past Surgical History:  Procedure Laterality Date   . CHOLECYSTECTOMY N/A 06/02/2018   Procedure: LAPAROSCOPIC CHOLECYSTECTOMY;  Surgeon: Vickie Epley, MD;  Location: [facility] ORS;  Service: General;  Laterality: N/A;  . KNEE ARTHROSCOPY     30 Years ago    ROS: Review of Systems Negative except as stated above  PHYSICAL EXAM: Vitals with  BMI 02/11/2020 01/28/2020 01/20/2020  Height - 6' 2" 6' 2"  Weight 241 lbs 8 oz 247 lbs 10 oz 242 lbs  BMI 30.99 93.71 69.67  Systolic 893 810 175  Diastolic 79 69 84  Pulse 98 90 79  SpO2- 98%, room air  Temperature- 99.29F, oral  Physical Exam General appearance - alert, well appearing, and in no distress and oriented to person, place, and time Mental status - alert, oriented to person, place, and time Neck - supple, no significant adenopathy Lymphatics - no palpable lymphadenopathy, no hepatosplenomegaly Chest - clear to auscultation, no wheezes, rales or rhonchi, symmetric air entry, no tachypnea, retractions or cyanosis Heart - normal rate, regular rhythm, normal S1, S2, no murmurs, rubs, clicks or gallops Neurological - alert, oriented, normal speech, no focal findings or movement disorder noted, neck supple without rigidity, cranial nerves II through XII intact, funduscopic exam normal, discs flat and sharp, DTR's normal and symmetric, motor and sensory grossly normal bilaterally, normal muscle tone, no tremors, strength 5/5, Romberg sign negative, normal gait and station Musculoskeletal - no joint tenderness, deformity or swelling, no muscular tenderness noted, full range of motion without pain Extremities - peripheral pulses normal, no pedal edema, no clubbing or cyanosis Skin - normal coloration and turgor, no rashes, no suspicious skin lesions noted  ASSESSMENT AND PLAN: 1. Uncontrolled type 2 diabetes mellitus with hyperglycemia ([place]): -Diabetes not controlled. A1C above goal at 7.8. CBG not at goal at 214. Patient reports he has eaten prior to this visit.  -Continue Metformin as  prescribed.  -Increase Insulin Glargine from 15 units/daily to 18 units/daily. -To achieve an A1C goal of less than or equal to 7.0 percent, a fasting blood sugar of 80 to 130 mg/dL and a postprandial glucose (90 to 120 minutes after a meal) less than 180 mg/dL. In the event of sugars less than 60 mg/dl or greater than 400 mg/dl please notify the clinic ASAP. It is recommended that you undergo annual eye exams and annual foot exams. -Discussed the importance of healthy eating habits, low-carbohydrate diet, low-sugar diet, regular aerobic exercise (at least 150 minutes a week as tolerated) and medication compliance to achieve or maintain control of diabetes. -Follow-up with clinical pharmacist in 2 weeks for diabetes checkup. Write your home blood sugar results down each day and bring those results to your appointment along with your home glucose monitor. -Follow-up with primary physician in 3 months or sooner if needed. - POCT glycosylated hemoglobin (Hb A1C) - Glucose (CBG) - metFORMIN (GLUCOPHAGE-XR) 500 MG 24 hr tablet; Take 2 tablets (1,000 mg total) by mouth 2 (two) times daily with a meal.  Dispense: 120 tablet; Refill: 2 - insulin glargine (LANTUS) 100 unit/mL SOPN; Inject 0.18 mLs (18 Units total) into the skin daily.  Dispense: 15 mL; Refill: 11  2. Essential hypertension: -Blood pressure controlled. -Continue Lisinopril as prescribed.  -Counseled on blood pressure goal of less than 130/80, low-sodium, DASH diet, medication compliance, 150 minutes of moderate intensity exercise per week as tolerated. Discussed medication compliance, adverse effects. -CBC to evaluate blood count.  -BMP last obtained 01/20/2020. -Follow-up with primary physician in 3 months or sooner if needed. - CBC With Differential - lisinopril (ZESTRIL) 20 MG tablet; Take 1 tablet (20 mg total) by mouth daily.  Dispense: 90 tablet; Refill: 0  Patient was given the opportunity to ask questions.  Patient verbalized  understanding of the plan and was able to repeat key elements of the plan.    Amy  Zachery Dauer, NP

## 2020-02-10 ENCOUNTER — Telehealth: Payer: Self-pay | Admitting: Orthopaedic Surgery

## 2020-02-10 NOTE — Telephone Encounter (Signed)
Called patient left message on voicemail to return call concerning MRI he had in March.

## 2020-02-11 ENCOUNTER — Other Ambulatory Visit: Payer: Self-pay | Admitting: Family

## 2020-02-11 ENCOUNTER — Ambulatory Visit: Payer: Medicaid Other | Attending: Family | Admitting: Family

## 2020-02-11 ENCOUNTER — Encounter: Payer: Self-pay | Admitting: Family

## 2020-02-11 ENCOUNTER — Other Ambulatory Visit: Payer: Self-pay

## 2020-02-11 VITALS — BP 113/79 | HR 98 | Temp 99.1°F | Resp 16 | Wt 241.5 lb

## 2020-02-11 DIAGNOSIS — Z87891 Personal history of nicotine dependence: Secondary | ICD-10-CM | POA: Insufficient documentation

## 2020-02-11 DIAGNOSIS — Z791 Long term (current) use of non-steroidal anti-inflammatories (NSAID): Secondary | ICD-10-CM | POA: Diagnosis not present

## 2020-02-11 DIAGNOSIS — Z885 Allergy status to narcotic agent status: Secondary | ICD-10-CM | POA: Diagnosis not present

## 2020-02-11 DIAGNOSIS — M545 Low back pain: Secondary | ICD-10-CM | POA: Diagnosis not present

## 2020-02-11 DIAGNOSIS — E1165 Type 2 diabetes mellitus with hyperglycemia: Secondary | ICD-10-CM | POA: Diagnosis present

## 2020-02-11 DIAGNOSIS — F1010 Alcohol abuse, uncomplicated: Secondary | ICD-10-CM | POA: Insufficient documentation

## 2020-02-11 DIAGNOSIS — M4722 Other spondylosis with radiculopathy, cervical region: Secondary | ICD-10-CM | POA: Diagnosis not present

## 2020-02-11 DIAGNOSIS — K7030 Alcoholic cirrhosis of liver without ascites: Secondary | ICD-10-CM | POA: Insufficient documentation

## 2020-02-11 DIAGNOSIS — I10 Essential (primary) hypertension: Secondary | ICD-10-CM | POA: Diagnosis not present

## 2020-02-11 DIAGNOSIS — Z79899 Other long term (current) drug therapy: Secondary | ICD-10-CM | POA: Insufficient documentation

## 2020-02-11 DIAGNOSIS — Z794 Long term (current) use of insulin: Secondary | ICD-10-CM | POA: Diagnosis not present

## 2020-02-11 LAB — POCT GLYCOSYLATED HEMOGLOBIN (HGB A1C): HbA1c, POC (controlled diabetic range): 7.8 % — AB (ref 0.0–7.0)

## 2020-02-11 LAB — GLUCOSE, POCT (MANUAL RESULT ENTRY): POC Glucose: 214 mg/dl — AB (ref 70–99)

## 2020-02-11 MED ORDER — METFORMIN HCL ER 500 MG PO TB24: 1000.0000 mg | ORAL_TABLET | Freq: Two times a day (BID) | ORAL | 2 refills | Status: DC

## 2020-02-11 MED ORDER — INSULIN GLARGINE 100 UNITS/ML SOLOSTAR PEN: 18.0000 [IU] | PEN_INJECTOR | Freq: Every day | SUBCUTANEOUS | 11 refills | Status: DC

## 2020-02-11 MED ORDER — LISINOPRIL 20 MG PO TABS: 20.0000 mg | ORAL_TABLET | Freq: Every day | ORAL | 0 refills | Status: DC

## 2020-02-11 NOTE — Patient Instructions (Addendum)
Continue Lisinopril for blood pressure. Continue Metformin and increase Lantus to 18 units for diabetes. Lab today. Follow-up with clinical pharmacist in 2 weeks for diabetes check. Follow-up with primary physician in 1 month for management of chronic issues. Diabetes Basics  Diabetes (diabetes mellitus) is a long-term (chronic) disease. It occurs when the body does not properly use sugar (glucose) that is released from food after you eat. Diabetes may be caused by one or both of these problems:  Your pancreas does not make enough of a hormone called insulin.  Your body does not react in a normal way to insulin that it makes. Insulin lets sugars (glucose) go into cells in your body. This gives you energy. If you have diabetes, sugars cannot get into cells. This causes high blood sugar (hyperglycemia). Follow these instructions at home: How is diabetes treated? You may need to take insulin or other diabetes medicines daily to keep your blood sugar in balance. Take your diabetes medicines every day as told by your doctor. List your diabetes medicines here: Diabetes medicines  Name of medicine: ______________________________ ? Amount (dose): _______________ Time (a.m./p.m.): _______________ Notes: ___________________________________  Name of medicine: ______________________________ ? Amount (dose): _______________ Time (a.m./p.m.): _______________ Notes: ___________________________________  Name of medicine: ______________________________ ? Amount (dose): _______________ Time (a.m./p.m.): _______________ Notes: ___________________________________ If you use insulin, you will learn how to give yourself insulin by injection. You may need to adjust the amount based on the food that you eat. List the types of insulin you use here: Insulin  Insulin type: ______________________________ ? Amount (dose): _______________ Time (a.m./p.m.): _______________ Notes:  ___________________________________  Insulin type: ______________________________ ? Amount (dose): _______________ Time (a.m./p.m.): _______________ Notes: ___________________________________  Insulin type: ______________________________ ? Amount (dose): _______________ Time (a.m./p.m.): _______________ Notes: ___________________________________  Insulin type: ______________________________ ? Amount (dose): _______________ Time (a.m./p.m.): _______________ Notes: ___________________________________  Insulin type: ______________________________ ? Amount (dose): _______________ Time (a.m./p.m.): _______________ Notes: ___________________________________ How do I manage my blood sugar?  Check your blood sugar levels using a blood glucose monitor as directed by your doctor. Your doctor will set treatment goals for you. Generally, you should have these blood sugar levels:  Before meals (preprandial): 80-130 mg/dL (4.4-7.2 mmol/L).  After meals (postprandial): below 180 mg/dL (10 mmol/L).  A1c level: less than 7%. Write down the times that you will check your blood sugar levels: Blood sugar checks  Time: _______________ Notes: ___________________________________  Time: _______________ Notes: ___________________________________  Time: _______________ Notes: ___________________________________  Time: _______________ Notes: ___________________________________  Time: _______________ Notes: ___________________________________  Time: _______________ Notes: ___________________________________  What do I need to know about low blood sugar? Low blood sugar is called hypoglycemia. This is when blood sugar is at or below 70 mg/dL (3.9 mmol/L). Symptoms may include:  Feeling: ? Hungry. ? Worried or nervous (anxious). ? Sweaty and clammy. ? Confused. ? Dizzy. ? Sleepy. ? Sick to your stomach (nauseous).  Having: ? A fast heartbeat. ? A headache. ? A change in your  vision. ? Tingling or no feeling (numbness) around the mouth, lips, or tongue. ? Jerky movements that you cannot control (seizure).  Having trouble with: ? Moving (coordination). ? Sleeping. ? Passing out (fainting). ? Getting upset easily (irritability). Treating low blood sugar To treat low blood sugar, eat or drink something sugary right away. If you can think clearly and swallow safely, follow the 15:15 rule:  Take 15 grams of a fast-acting carb (carbohydrate). Talk with your doctor about how much you should take.  Some fast-acting carbs are: ? Sugar tablets (  glucose pills). Take 3-4 glucose pills. ? 6-8 pieces of hard candy. ? 4-6 oz (120-150 mL) of fruit juice. ? 4-6 oz (120-150 mL) of regular (not diet) soda. ? 1 Tbsp (15 mL) honey or sugar.  Check your blood sugar 15 minutes after you take the carb.  If your blood sugar is still at or below 70 mg/dL (3.9 mmol/L), take 15 grams of a carb again.  If your blood sugar does not go above 70 mg/dL (3.9 mmol/L) after 3 tries, get help right away.  After your blood sugar goes back to normal, eat a meal or a snack within 1 hour. Treating very low blood sugar If your blood sugar is at or below 54 mg/dL (3 mmol/L), you have very low blood sugar (severe hypoglycemia). This is an emergency. Do not wait to see if the symptoms will go away. Get medical help right away. Call your local emergency services (911 in the [place] Do not drive yourself to the hospital. Questions to ask your health care provider  Do I need to meet with a diabetes educator?  What equipment will I need to care for myself at home?  What diabetes medicines do I need? When should I take them?  How often do I need to check my blood sugar?  What number can I call if I have questions?  When is my next doctor's visit?  Where can I find a support group for people with diabetes? Where to find more information  American Diabetes Association:  www.diabetes.org  American Association of Diabetes Educators: www.diabeteseducator.org/patient-resources Contact a doctor if:  Your blood sugar is at or above 240 mg/dL (13.3 mmol/L) for 2 days in a row.  You have been sick or have had a fever for 2 days or more, and you are not getting better.  You have any of these problems for more than 6 hours: ? You cannot eat or drink. ? You feel sick to your stomach (nauseous). ? You throw up (vomit). ? You have watery poop (diarrhea). Get help right away if:  Your blood sugar is lower than 54 mg/dL (3 mmol/L).  You get confused.  You have trouble: ? Thinking clearly. ? Breathing. Summary  Diabetes (diabetes mellitus) is a long-term (chronic) disease. It occurs when the body does not properly use sugar (glucose) that is released from food after digestion.  Take insulin and diabetes medicines as told.  Check your blood sugar every day, as often as told.  Keep all follow-up visits as told by your doctor. This is important. This information is not intended to replace advice given to you by your health care provider. Make sure you discuss any questions you have with your health care provider. Document Revised: 05/06/2019 Document Reviewed: 11/15/2017 Elsevier Patient Education  [place].

## 2020-02-12 LAB — CBC WITH DIFFERENTIAL
Basophils Absolute: 0.1 10*3/uL (ref 0.0–0.2)
Basos: 1 %
EOS (ABSOLUTE): 0.3 10*3/uL (ref 0.0–0.4)
Eos: 3 %
Hematocrit: 45.8 % (ref 37.5–51.0)
Hemoglobin: 15.2 g/dL (ref 13.0–17.7)
Immature Grans (Abs): 0 10*3/uL (ref 0.0–0.1)
Immature Granulocytes: 0 %
Lymphocytes Absolute: 1.8 10*3/uL (ref 0.7–3.1)
Lymphs: 20 %
MCH: 28.5 pg (ref 26.6–33.0)
MCHC: 33.2 g/dL (ref 31.5–35.7)
MCV: 86 fL (ref 79–97)
Monocytes Absolute: 1.1 10*3/uL — ABNORMAL HIGH (ref 0.1–0.9)
Monocytes: 11 %
Neutrophils Absolute: 6.2 10*3/uL (ref 1.4–7.0)
Neutrophils: 65 %
RBC: 5.34 x10E6/uL (ref 4.14–5.80)
RDW: 14.2 % (ref 11.6–15.4)
WBC: 9.5 10*3/uL (ref 3.4–10.8)

## 2020-02-12 MED FILL — LISINOPRIL 20 MG TABLET: 20 | 30 days supply | Qty: 30 | Fill #2

## 2020-02-12 MED FILL — LANTUS SOLOSTAR 100 UNITS/M: 100 | 20 days supply | Qty: 3 | Fill #3

## 2020-02-12 MED FILL — ACCU-CHEK GUIDE TEST STRIP: 30 days supply | Qty: 100 | Fill #1

## 2020-02-13 NOTE — Progress Notes (Signed)
Please call patient with update.   No anemia.   Blood sugars and A1C discussed in clinic.

## 2020-02-15 ENCOUNTER — Telehealth: Payer: Self-pay

## 2020-02-15 NOTE — Telephone Encounter (Signed)
Contacted pt to go over lab results pt didn't answer left a detailed vm informing pt of results and if he has any questions or concerns to give a call

## 2020-02-15 NOTE — Telephone Encounter (Signed)
-----   Message from Camillia Herter, NP sent at 02/13/2020  8:45 AM EDT ----- Please call patient with update.   No anemia.   Blood sugars and A1C discussed in clinic.

## 2020-02-26 ENCOUNTER — Ambulatory Visit: Payer: Medicaid Other | Admitting: Pharmacist

## 2020-03-01 ENCOUNTER — Ambulatory Visit
Admission: RE | Admit: 2020-03-01 | Discharge: 2020-03-01 | Disposition: A | Discharge status: Home / Self Care | Payer: Medicaid Other | Source: Ambulatory Visit | Attending: Urology | Admitting: Urology

## 2020-03-01 ENCOUNTER — Other Ambulatory Visit: Payer: Self-pay

## 2020-03-01 DIAGNOSIS — K529 Noninfective gastroenteritis and colitis, unspecified: Secondary | ICD-10-CM | POA: Diagnosis not present

## 2020-03-01 DIAGNOSIS — R3129 Other microscopic hematuria: Secondary | ICD-10-CM | POA: Insufficient documentation

## 2020-03-01 LAB — POCT I-STAT CREATININE: Creatinine, Ser: 0.8 mg/dL (ref 0.61–1.24)

## 2020-03-01 MED ORDER — IOHEXOL 300 MG/ML  SOLN
125.0000 mL | Freq: Once | INTRAMUSCULAR | Status: AC | PRN
  Administered 2020-03-01: 125 mL via INTRAVENOUS

## 2020-03-04 ENCOUNTER — Encounter: Payer: Self-pay | Admitting: Urology

## 2020-03-04 ENCOUNTER — Other Ambulatory Visit: Payer: Self-pay

## 2020-03-04 ENCOUNTER — Ambulatory Visit: Payer: Medicaid Other | Attending: Family Medicine | Admitting: Pharmacist

## 2020-03-04 ENCOUNTER — Ambulatory Visit (INDEPENDENT_AMBULATORY_CARE_PROVIDER_SITE_OTHER): Payer: Medicaid Other | Admitting: Urology

## 2020-03-04 VITALS — BP 133/88 | HR 101 | Ht 73.0 in | Wt 239.8 lb

## 2020-03-04 DIAGNOSIS — R3129 Other microscopic hematuria: Secondary | ICD-10-CM

## 2020-03-04 DIAGNOSIS — E1165 Type 2 diabetes mellitus with hyperglycemia: Secondary | ICD-10-CM | POA: Diagnosis not present

## 2020-03-04 LAB — URINALYSIS, COMPLETE
Bilirubin, UA: NEGATIVE
Glucose, UA: NEGATIVE
Ketones, UA: NEGATIVE
Leukocytes,UA: NEGATIVE
Nitrite, UA: NEGATIVE
Protein,UA: NEGATIVE
Specific Gravity, UA: 1.02 (ref 1.005–1.030)
Urobilinogen, Ur: 0.2 mg/dL (ref 0.2–1.0)
pH, UA: 6 (ref 5.0–7.5)

## 2020-03-04 LAB — MICROSCOPIC EXAMINATION: Bacteria, UA: NONE SEEN

## 2020-03-04 MED ORDER — INSULIN GLARGINE 100 UNITS/ML SOLOSTAR PEN: PEN_INJECTOR | SUBCUTANEOUS | 2 refills | Status: DC

## 2020-03-04 MED ORDER — LANTUS SOLOSTAR 100 UNIT/ML ~~LOC~~ SOPN: PEN_INJECTOR | SUBCUTANEOUS | 2 refills | Status: DC

## 2020-03-04 MED ORDER — CEPHALEXIN 250 MG PO CAPS
500.0000 mg | ORAL_CAPSULE | Freq: Once | ORAL | Status: AC
  Administered 2020-03-04: 500 mg via ORAL

## 2020-03-04 MED FILL — TOPIRAMATE 25 MG TABS: 25 | 30 days supply | Qty: 30 | Fill #1

## 2020-03-04 MED FILL — LANTUS SOLOSTAR 100 UNITS/M: 100 | 25 days supply | Qty: 6 | Fill #0

## 2020-03-04 NOTE — Progress Notes (Signed)
    S:    PCP: Dr. Chapman Fitch  No chief complaint on file.  Patient arrives in good spirits.  Presents for diabetes evaluation, education, and management. Patient was referred and last seen by Amy on 02/11/2020.   Family/Social History:  - FHx: aneurysm (father) - Tobacco: former smoke - Alcohol: occasional use   Insurance coverage/medication affordability: [place] [employer]  Patient reports adherence with medications.  Current diabetes medications include: metformin XR 1000 mg BID, Lantus 18 units daily.   Patient denies hypoglycemic events.  Patient reported dietary habits:  - Limited to what he can get using EBT  Patient-reported exercise habits:  - Limited d/t chronic back pain   Patient denies nocturia (nighttime urination).  Patient reports neuropathy (nerve pain). Patient denies visual changes. Patient reports self foot exams.     O:  POCT glucose: 173  Lab Results  Component Value Date   HGBA1C 7.8 (A) 02/11/2020   There were no vitals filed for this visit.  Lipid Panel     Component Value Date/Time   CHOL 138 07/01/2019 1655   TRIG 167 (H) 07/01/2019 1655   HDL 33 (L) 07/01/2019 1655   CHOLHDL 4.2 07/01/2019 1655   LDLCALC 76 07/01/2019 1655   Home CBGs: Fasting: 147 - low 200s  A/P: Diabetes longstanding currently uncontrolled. Patient is able to verbalize appropriate hypoglycemia management plan. Patient is adherent with medication. Control is suboptimal due to dietary indiscretion and sedentary lifestyle.    -Continue metformin 1000 mg XR BID. -Inc Lantus to 20 units daily. May increase to 24 units daily over the next week if home fasting CBGs remain >130.  -Extensively discussed pathophysiology of diabetes, recommended lifestyle interventions, dietary effects on blood sugar control -Counseled on s/sx of and management of hypoglycemia -Next A1C anticipated 09/2019.   Written patient instructions provided.  Total time in face to face counseling 15 minutes.    Follow up w/ PCP.   Benard Halsted, PharmD, [place] [phone]

## 2020-03-04 NOTE — Patient Instructions (Signed)

## 2020-03-04 NOTE — Progress Notes (Signed)
   03/04/20  CC:  Chief Complaint  Patient presents with  . Cysto    HPI: F/u - MH and luts - noc x 3. He had blood on a dip and initial UA at [facility] without hematuria. No gross hematuria. He had gallbladder surgery and changes in his urine. Different color and smell of the urine. He takes apple cider vinegar. He snores. No Gu hx or surgery.His PSA was 0.29 Oct 2018.No FH PCa. PVR was 3 ml.He is claiming disability because of DDD.He underwent renal ultrasound May 2020. Kidneys appeared normal. Prostate measured 35 g. Cystoscopy benign June 2020. He notices a strange smell to his urine. Cystoscopy was benign. PVR 3 ml. NG risk includes DDD - severe (disability).   He continued to have strange urine odor and UA with 3-10 rbc. He's had some weak stream and starting and stopping. He has DDD and numbness and tingling in his legs. Last Cr 0.95. 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  We also decided to repeat cystoscopy.  He is here today for cystoscopy.  I reviewed all the CT images. He did have a normal CBC last month.  Blood pressure 133/88, pulse (!) 101, height 6\' 1"  (1.854 m), weight 239 lb 12.8 oz (108.8 kg). NED. A&Ox3.   No respiratory distress   Abd soft, NT, ND Normal phallus with bilateral descended testicles  Cystoscopy Procedure Note  Patient identification was confirmed, informed consent was obtained, and patient was prepped using Betadine solution.  Lidocaine jelly was administered per urethral meatus.     Pre-Procedure: - Inspection reveals a normal caliber ureteral meatus.  Procedure: The flexible cystoscope was introduced without difficulty - No urethral strictures/lesions are present. - prostate short, mild hypertrophy  - normal bladder neck - Bilateral ureteral orifices identified -  Bladder mucosa  reveals no ulcers, tumors, or lesions - No bladder stones - No trabeculation  Retroflexion shows normal bladder neck    Post-Procedure: - Patient tolerated the procedure well  Assessment/ Plan:  Microscopic hematuria-benign urologic evaluation.  I told him about the CT findings and to follow-up with the PCP for further testing to rule out lymphoma and management of mesenteric panniculitis.   No follow-ups on file.  Festus Aloe, MD

## 2020-03-09 MED FILL — metFORMIN HCL ER 500 MG TB2: 500 | 30 days supply | Qty: 120 | Fill #2

## 2020-03-28 ENCOUNTER — Ambulatory Visit: Payer: Medicaid Other | Admitting: Neurology

## 2020-03-31 ENCOUNTER — Ambulatory Visit: Payer: Medicaid Other | Admitting: Psychology

## 2020-03-31 ENCOUNTER — Ambulatory Visit (INDEPENDENT_AMBULATORY_CARE_PROVIDER_SITE_OTHER): Payer: Medicaid Other | Admitting: Psychology

## 2020-03-31 ENCOUNTER — Encounter: Payer: Self-pay | Admitting: Psychology

## 2020-03-31 ENCOUNTER — Other Ambulatory Visit: Payer: Self-pay

## 2020-03-31 DIAGNOSIS — F332 Major depressive disorder, recurrent severe without psychotic features: Secondary | ICD-10-CM

## 2020-03-31 DIAGNOSIS — F411 Generalized anxiety disorder: Secondary | ICD-10-CM | POA: Diagnosis not present

## 2020-03-31 DIAGNOSIS — R4189 Other symptoms and signs involving cognitive functions and awareness: Secondary | ICD-10-CM

## 2020-03-31 DIAGNOSIS — F1010 Alcohol abuse, uncomplicated: Secondary | ICD-10-CM | POA: Diagnosis not present

## 2020-03-31 DIAGNOSIS — R41840 Attention and concentration deficit: Secondary | ICD-10-CM

## 2020-03-31 DIAGNOSIS — E1142 Type 2 diabetes mellitus with diabetic polyneuropathy: Secondary | ICD-10-CM | POA: Insufficient documentation

## 2020-03-31 DIAGNOSIS — F909 Attention-deficit hyperactivity disorder, unspecified type: Secondary | ICD-10-CM | POA: Insufficient documentation

## 2020-03-31 DIAGNOSIS — F329 Major depressive disorder, single episode, unspecified: Secondary | ICD-10-CM | POA: Insufficient documentation

## 2020-03-31 NOTE — Progress Notes (Signed)
NEUROPSYCHOLOGICAL EVALUATION [place] Neu[facility]  Date of Evaluation: March 31, 2020  Reason for Referral:   Gerald Olson is a 53 y.o. right-handed (unconfirmed history of being ambidextrous) Caucasian male referred by Freeman Caldron, PA-C, to characterize his current cognitive functioning and assist with diagnostic clarity and treatment planning in the context of subjective cognitive decline.   Assessment and Plan:   Clinical Impression(s): Gerald Olson pattern of performance is suggestive of a prominent weakness across processing speed. Additional variability was noted across more complex attention, as well as both encoding (i.e., learning) and retrieval aspects of verbal and visual memory. It is possible that scores in the average range represent a decline from a previously higher level of functioning given that prior IQ testing suggested intellectual abilities in the superior range. However, it is important to note that these scores were obtained during an assignment for a graduate level course and did not represent a formal clinical evaluation. Current premorbid intellectual estimates scored in the upper limits of the average range. Based upon current scores, performances were appropriate across domains of basic attention, executive functioning, receptive and expressive language, and visuospatial abilities. Gerald Olson denied difficulties completing instrumental activities of daily living (ADLs) independently.  Relative to his prior IQ testing, current results do suggest a significant decline across aspects of processing speed, as well as a more modest decline across complex attention/working memory. Regarding mood-related questionnaires, he scored in the severe range for both anxiety and depression, endorsing passive suicidal ideation without intent or plan within the past two weeks across the latter. He also reported moderate levels of sleep  dysfunction.  Regarding etiology, there are several factors which should be considered. The most likely culprit for cognitive weakness/decline is pronounced psychiatric distress. Severe symptoms of depression and anxiety have been shown to directly influence cognitive functioning, especially across processing speed, complex attention, and encoding/retrieval aspects of memory. Gerald Olson pattern across testing is very consistent with these expectations. Additionally, ongoing sleep dysfunction, chronic pain, and frequent headaches would also exacerbate these weaknesses and impact cognitive functioning across the same domains. Given his history of ADHD, it is also possible that ongoing mood, sleep, and pain symptoms are exacerbating an already dysregulated attentional network, creating worsening outward symptoms. Additionally, ongoing vascular factors, namely his history of uncontrolled type II diabetes could also be influencing cognitive functioning. While there was no neuroimaging available to review, vascular changes would affect similar areas of cognitive functioning mentioned above and this could be contributing to ongoing deficits. Despite variability across memory measures, this domain was largely intact. Given strong retention percentages and recognition scores, there is no concern for an early-onset neurodegenerative illness (e.g., Alzheimer's disease) at the present time. Continued medical monitoring will be important moving forward.  Recommendations: A referral for neuroimaging in the form of a brain MRI should be considered to assess for any anatomical correlates for exhibited cognitive weaknesses.   A combination of medication and psychotherapy has been shown to be most effective at treating symptoms of anxiety and depression. As such, Gerald Olson is encouraged to speak with his prescribing physician regarding medication adjustments to optimally manage these symptoms. Likewise, Gerald Olson is  encouraged to consider engaging in short-term psychotherapy to address symptoms of psychiatric distress. He would benefit from an active and collaborative therapeutic environment, rather than one purely supportive in nature. Recommended treatment modalities include Cognitive Behavioral Therapy (CBT) or Acceptance and Commitment Therapy (ACT).  Active treatment surrounding pain  management will be important moving forward. This can be accomplished with medications, psychotherapy, physical therapy, or a combination. He is encouraged to discuss these options with his PCP.    Gerald Olson reported consuming on average 85-88 alcoholic beverages per week. The CDC defines heavy drinking as 15 or more drinks per week for males. As Gerald Olson would likely fall within this category of chronic heavy drinking, he is encouraged to decrease his alcohol intake. This would require him to find another appetite stimulant as he reported consuming alcohol prior to eating. Chronic heavy drinking behaviors have been linked to cognitive decline. Alcohol use is also likely contributing to ongoing sleep dysfunction. While this substance may aid in him relaxing and falling asleep, it will disrupt his sleep throughout the night and prevent him from spending an appropriate amount of time in deeper, more restorative levels. Additionally, while marijuana usage may help in initially falling asleep, there is evidence to suggest that it also interferes with one's ability to spend an appropriate amount of time in deeper, more restorative levels of sleep.   In addition to high alcohol intake, Gerald Olson caffeine intake also seems extremely high. He would be encouraged to diminish this amount as this is also likely contributing to his ongoing sleep dysfunction.   Gerald Olson is encouraged to attend to lifestyle factors for brain health (e.g., regular physical exercise, good nutrition habits, regular participation in cognitively-stimulating  activities, and general stress management techniques), which are likely to have benefits for both emotional adjustment and cognition. In fact, in addition to promoting good general health, regular exercise incorporating aerobic activities (e.g., brisk walking, jogging, cycling, etc.) has been demonstrated to be a very effective treatment for depression and stress, with similar efficacy rates to both antidepressant medication and psychotherapy. Optimal control of vascular risk factors (including safe cardiovascular exercise and adherence to dietary recommendations) is encouraged.   If interested, there are some activities which have therapeutic value and can be useful in keeping him cognitively stimulated. For suggestions, Gerald Olson is encouraged to go to the following website: https://www.[facility].org/get-to-know-barrow/centers-programs/neurorehabilitation-center/neuro-rehab-apps-and-games/ which has options, categorized by level of difficulty. It should be noted that these activities should not be viewed as a substitute for therapy.  When learning new information, he would benefit from information being broken up into small, manageable pieces. He may also find it helpful to articulate the material in his own words and in a context to promote encoding at the onset of a new task. This material may need to be repeated multiple times to promote encoding.  Memory can be improved using internal strategies such as rehearsal, repetition, chunking, mnemonics, association, and imagery. External strategies such as written notes in a consistently used memory journal, visual and nonverbal auditory cues such as a calendar on the refrigerator or appointments with alarm, such as on a cell phone, can also help maximize recall.    To address problems with processing speed, he may wish to consider:   -Ensuring that he is alerted when essential material or instructions are being presented   -Adjusting the speed at which  new information is presented   -Allowing additional processing time or a chance to rehearse novel information   -Allowing for more time in comprehending, processing, and responding in conversation  To address problems with fluctuating attention, he may wish to consider:   -Avoiding external distractions when needing to concentrate   -Limiting exposure to fast paced environments with multiple sensory demands   -Writing down complicated information  and using checklists   -Attempting and completing one task at a time (i.e., no multi-tasking)   -Verbalizing aloud each step of a task to maintain focus   -Reducing the amount of information considered at one time  Review of Records:   Gerald Olson was seen by [facility] Antony Contras, M.D.) on 01/28/2020 for an evaluation of lower extremity paresthesias, seemly progressive, which had been present for the past six months. He reported constant burning, tingling, and pins and needle sensations which is present throughout the day. He recently started taking gabapentin 100 mg twice daily which was said to not be effective. He also reported ongoing poor balance and frequent falls. The latter were largely attributed to acute pain and limited gait. He has a longstanding history of degenerative cervical and lumbar spine disease. He has been seen by an orthopedic surgeon willing to operate on his neck. However, his diabetes was said to be uncontrolled and he is working with an endocrinologist to bring it under control so he can have the surgery. A cervical spine x-ray on 09/22/2019 revealed degenerative changes from C3-C7. X-rays of lumbar spine on 07/21/2018 revealed significant facet degenerative changes at L5-S1 with significant foraminal narrowing on the right at the L4-5 and on the left at L5-S1. He takes tramadol and muscle relaxant Robaxin which helps somewhat. He also stated that his feet are constantly numb with lack of feeling which has been  contributing to his falling. His sugars have been persistently high and his last A1c on 10/01/2019 was 7.6 (but improved from 9.1 from three months prior). He had EMG nerve conduction study done on 10/22/2019 by Dr. 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  He also met with [place] Freeman Caldron, [place]) on 12/16/2019. During this visit, complaints of memory loss were documented. However, no further information was provided regarding the nature or context of these complaints. Ultimately, Gerald Olson was referred for a comprehensive neuropsychological evaluation to characterize his cognitive abilities and to assist with diagnostic clarity and treatment planning.   No neuroimaging was available for review. Gerald Olson presented a prior WAIS-IV intellectual assessment from 11/25/2015 Burnadette Pop, Ph.D.) which was completed for a graduate school class assignment rather than as a part of a clinical evaluation. WAIS-IV Index scores were as follows: FSIQ: 139; GAI: 139; VCI: 145; PRI: 123; WMI: 133; PSI: 122.  Past Medical History:  Diagnosis Date  . ADHD (attention deficit hyperactivity disorder)    Diagnosed during teenage years  . Alcohol use disorder    On average 14-21 drinks per week  . Alcoholic cirrhosis of liver    Per patient - condition is not related to moderate alcohol use but rather prolonged NSAID use and recent abuse.  . Calculus of gallbladder without cholecystitis without obstruction   . Chronic pain    Neck/lower back  . Degenerative disc disease, lumbar   . Diabetic polyneuropathy   . Essential hypertension, benign   . Generalized anxiety disorder   . Glaucoma    Per patient report  . Headache   . 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 Major depressive disorder   . Other spondylosis  with radiculopathy, cervical region   . Retinopathy    Right eye; per patient report  . Type 2 diabetes mellitus with hyperglycemia  Past Surgical History:  Procedure Laterality Date  . CHOLECYSTECTOMY N/A 06/02/2018   Procedure: LAPAROSCOPIC CHOLECYSTECTOMY;  Surgeon: Vickie Epley, MD;  Location: [facility] ORS;  Service: General;  Laterality: N/A;  . KNEE ARTHROSCOPY     30 Years ago    Current Outpatient Medications:  .  Accu-Chek FastClix Lancets MISC, Use as instructed to check blood sugar up to 3 times daily., Disp: 102 each, Rfl: 11 .  Ashwagandha 500 MG CAPS, Take 1 capsule by mouth 3 (three) times daily. (Patient not taking: Reported on 03/04/2020), Disp: , Rfl:  .  Blood Glucose Monitoring Suppl (ACCU-CHEK GUIDE ME) w/Device KIT, 1 kit by Does not apply route 3 (three) times daily., Disp: 1 kit, Rfl: 0 .  celecoxib (CELEBREX) 200 MG capsule, Take 1 capsule (200 mg total) by mouth 2 (two) times daily., Disp: 60 capsule, Rfl: 1 .  Cinnamon 500 MG capsule, Take 1,000 mg by mouth 2 (two) times daily., Disp: , Rfl:  .  diclofenac sodium (VOLTAREN) 1 % GEL, Apply 1 application topically 3 (three) times daily as needed (pain). (Patient not taking: Reported on 03/04/2020), Disp: , Rfl:  .  glucose blood (ACCU-CHEK GUIDE) test strip, Use as instructed to check blood sugar up to 3 times daily., Disp: 100 each, Rfl: 12 .  insulin glargine (LANTUS SOLOSTAR) 100 UNIT/ML Solostar Pen, Inject 20 units [place] daily. Increase to 24 units after 1 week if fasting glucose >130., Disp: 15 mL, Rfl: 2 .  lisinopril (ZESTRIL) 10 MG tablet, Take 10 mg by mouth daily., Disp: , Rfl:  .  lisinopril (ZESTRIL) 20 MG tablet, Take 1 tablet (20 mg total) by mouth daily., Disp: 90 tablet, Rfl: 0 .  metFORMIN (GLUCOPHAGE-XR) 500 MG 24 hr tablet, Take 2 tablets (1,000 mg total) by mouth 2 (two) times daily with a meal., Disp: 120 tablet, Rfl: 2 .  methocarbamol (ROBAXIN) 500 MG tablet, Take 2 tablets (1,000 mg total) by  mouth every 6 (six) hours as needed for muscle spasms., Disp: 90 tablet, Rfl: 0 .  Multiple Vitamins-Minerals (MULTIVITAMIN WITH MINERALS) tablet, Take 1 tablet by mouth daily., Disp: , Rfl:  .  naproxen sodium (ALEVE) 220 MG tablet, Take 660 mg by mouth daily as needed (pain)., Disp: , Rfl:  .  Nutritional Supplements (DHEA PO), Take 100 mg by mouth daily., Disp: , Rfl:  .  ondansetron (ZOFRAN) 4 MG tablet, Take 1 tablet (4 mg total) by mouth every 8 (eight) hours as needed for nausea or vomiting., Disp: 20 tablet, Rfl: 0 .  OVER THE COUNTER MEDICATION, Black seed oil 2000per day, Disp: , Rfl:  .  topiramate (TOPAMAX) 25 MG tablet, Take 1 tablet (25 mg total) by mouth at bedtime., Disp: 30 tablet, Rfl: 2 .  TRUEPLUS PEN NEEDLES 32G X 4 MM MISC, SMARTSIG:Injection Daily, Disp: , Rfl:   Clinical Interview:   Cognitive Symptoms: Decreased short-term memory: Endorsed. He reportedly generalized symptoms of short-term memory loss. More specific examples included trouble recalling the details of previous conversations, trouble recalling the names of his professors from approximately three years prior while he was in school, and frequently losing his train of thought. He also reported some instances where he has cooked food and then left it out due to forgetting about it. Deficits were said to be present for the past two years but have worsened over the past 12 months.  Decreased long-term memory: Denied. Decreased attention/concentration: Endorsed. He reported trouble with both sustained attention and increased ease  of distractibility. These conditions were said to be longstanding in nature given that he reported being diagnosed with ADHD during his teenage years. However, these symptoms have seemed worse over the past few years.  Reduced processing speed: Endorsed. Specifically, he reported his mind moving quite rapidly, but then experiencing transient instances where processing speed will seem very slowed  and he will lose his train of thought. When provided a cue, he is generally able to resume his normal speed of processing.  Difficulties with executive functions: Endorsed. Primarily, he described difficulties with a reduced ability to multi-task. He also reported increased difficulties with organization, noting that he has trouble recalling where he places certain items in his home.  Difficulties with emotion regulation: Denied. Difficulties with receptive language: Denied. Difficulties with word finding: Endorsed. Decreased visuoperceptual ability: Denied.  Difficulties completing ADLs: Denied.  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 He denied a loss in consciousness but reported temporarily feeling dazed/disoriented, experiencing tunnel vision, and having symptoms of tinnitus. Imaging at that time was said to be unremarkable. No other events were reported.  History of stroke: Denied. History of seizure activity: Denied. History of known exposure to toxins: Denied. Symptoms of chronic pain: Endorsed. He reported the presence of degenerative disc disease and experiences significant pain symptoms in his neck and lower back. These were said to be much worse during the past two months to where he feels like he has whiplash at all points of the day.  Experience of frequent headaches/migraines: Endorsed. He reported experiencing a near constant headache for the past two months of unknown cause. He reported that headache symptoms typically are focused behind the eyes and can move into the temporal regions. He also reported a secondary headache progression where symptoms will arise from the neck and move from the back to the skull to the front.  Frequent instances of dizziness/vertigo: Denied. Symptoms were said to occur occasionally, largely when standing up.    Sensory changes: He reported having retinopathy and a stigmatism in his eyes. He has also been told that he has glaucoma. Other sensory changes/difficulties (e.g., hearing, taste, or smell) were denied.  Balance/coordination difficulties: Endorsed. Symptoms were attributed to his history of diabetic neuropathy and the numbness/tingling/burning sensations he experiences regularly in his lower extremities. He reported often needing to stabilize himself against a wall or solid object when standing and will occasionally feel like he is swaying. Sleep deprivation was noted that make these symptoms worse.  Other motor difficulties: Endorsed. He reported subtle tremulous behaviors in his upper extremities of unknown cause.   Sleep History: Estimated hours obtained each night: 8 hours.  Difficulties falling asleep: Endorsed. Difficulties staying asleep: Endorsed. This represented his most pressing sleep-related issue as he described his sleep as very broken. He reported commonly being awoken every few hours due to ongoing neck pain and will occasionally have pronounced difficulties falling back asleep.  Feels rested and refreshed upon awakening: Denied.  History of snoring: Unknown as he lives alone but likely.  History of waking up gasping for air: Denied. Witnessed breath cessation while asleep: Denied.  History of vivid dreaming: Denied. Excessive movement while asleep: Denied. Instances of acting out his dreams: Denied.  Psychiatric/Behavioral Health History: Depression: Endorsed. Symptoms of depression were said to be somewhat longstanding, dating back at least three years prior. Symptoms were said to have been exacerbated during the  end of his time in his Master's program (2018). Around the same time, he reported his dog being killed which worsened symptoms. Symptoms were said to have further worsened over the past 1.5 years due to significant chronic pain symptoms. He denied current or remote  suicidal ideation, intent, or plan. However, he did acknowledge "death apathy," which he defined as not having active thoughts surrounding death, but would not fight it if it came about naturally.  Anxiety: Endorsed. He described current anxiety symptoms as "through the roof." These were said to be largely generalized in nature. Martial arts meditative practices were said to be helpful.  Mania: Denied. Trauma History: Denied. Visual/auditory hallucinations: Denied. However, he did report seeing "fireflies" or small flashes of light out of his peripheral vision at times.  Delusional thoughts: Denied.  Tobacco: He reported quitting in the past. However, he does currently vape.  Alcohol: He reported consuming, on average, 2-3 beers per night. He noted that this number can range from 0 to 6 depending on the day. Alcohol was said to help increase his appetite as well as diminish symptoms of anxiety. He denied a history of problematic alcohol abuse or dependence.  Recreational drugs: He reported consuming edible THC products approximately once per week as means to assist with falling asleep.  Caffeine: He reported consuming a 28oz mug of double strength green tea throughout the day.   Family History: Problem Relation Age of Onset  . Aneurysm Father   . Multiple sclerosis Sister    This information was confirmed by Gerald Olson.  Academic/Vocational History: Highest level of educational attainment: 18 years. He reported completing his Bachelor's and Master's degree (Psychology) in a four year period, recently graduating in 2018. He described himself as an average (C) student in high school settings but strongly applied himself during his college studies, becoming a very high Nurse, learning disability. No relative weaknesses were identified. He theorized that previously poor performance in academic settings may have been due to a combination of ADHD symptoms and boredom.  History of developmental delay:  Denied. History of grade repetition: Denied. Enrollment in special education courses: Denied. History of LD/ADHD: Endorsed. As described above, he was diagnosed with ADHD during his teenage years.   Employment: He served in the [employer] during the 52s and 90s. 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 Following his [employer], he worked in a variety of capacities before returning to school. He reported current aspirations and considerations to return to school to complete a doctoral degree emphasizing research.   Evaluation Results:   Behavioral Observations: Gerald Olson was unaccompanied, arrived to his appointment on time, and was appropriately dressed and groomed. He appeared alert and oriented. He ambulated with the assistance of a cane and exhibited difficulty getting out of chairs due to ongoing pain symptoms and balance concerns. Gross motor functioning appeared intact upon informal observation and no abnormal movements (e.g., tremors) were noted. Tremulous behaviors were only seen when holding out his head to demonstrate these symptoms. They were not seen during conversation but were observed to a very mild extent across drawing-based motorized tasks during testing. His affect was generally relaxed and positive, but did range appropriately given the subject being discussed during the clinical interview or the task at hand during testing procedures. Spontaneous speech was fluent. He did experience word finding difficulties throughout the clinical interview and lost his train of thought on several occasions. Thought processes were  coherent and normal in content; however, he was tangential at times. Insight into his cognitive difficulties appeared adequate. During testing, sustained attention was appropriate. Task engagement was adequate and he persisted when challenged. Overall, Mr. Hartig was cooperative with the clinical interview  and subsequent testing procedures.   Adequacy of Effort: The validity of neuropsychological testing is limited by the extent to which the individual being tested may be assumed to have exerted adequate effort during testing. Mr. Menges expressed his intention to perform to the best of his abilities and exhibited adequate task engagement and persistence. Scores across stand-alone and embedded performance validity measures were within expectation. As such, the results of the current evaluation are believed to be a valid representation of Gerald Olson current cognitive functioning.  Test Results: Mr. Quattrone was fully oriented at the time of the current evaluation.  Intellectual abilities based upon educational and vocational attainment were estimated to be in the above average range. Prior IQ testing completed in 2017 suggested that intellectual abilities were in the superior range (FSIQ: 139). However, it should be noted that this was completed as a part of a class assignment and not a clinical evaluation. During the current evaluation, premorbid abilities were estimated to be within the average range based upon a single-word reading test.   Processing speed was exceptionally low to below average. Basic attention was average. More complex attention (e.g., working memory) was below average to average. Executive functioning was largely average. Mild variability was noted across a task of response inhibition.  Assessed receptive language abilities were average. Likewise, Mr. Koerber did not exhibit any difficulties comprehending task instructions and answered all questions asked of him appropriately. Assessed expressive language (e.g., verbal fluency and confrontation naming) was average.     Assessed visuospatial/visuoconstructional abilities were average to well above average.    Learning (i.e., encoding) of novel verbal and visual information was somewhat variable, ranging from the below average to above  average normative ranges. Spontaneous delayed recall (i.e., retrieval) of previously learned information was also variable, ranging from the well below average to above average ranges. Retention rates were 87% across a story learning task, 138% across a list learning task, and 63% across a shape learning task. Performance across recognition tasks was average to above average, suggesting evidence for information consolidation.   Results of emotional screening instruments suggested that recent symptoms of generalized anxiety were in the severe range, while symptoms of depression were also within the severe range. A screening instrument assessing recent sleep quality suggested the presence of moderate sleep dysfunction.  Tables of Scores:   Note: This summary of test scores accompanies the interpretive report and should not be considered in isolation without reference to the appropriate sections in the text. Descriptors are based on appropriate normative data and may be adjusted based on clinical judgment. The terms "impaired" and "within normal limits (WNL)" are used when a more specific level of functioning cannot be determined.       Effort Testing:   DESCRIPTOR       ACS Word Choice: --- --- Within Expectation  WAIS-IV Reliable Digit Span: --- --- Within Expectation  D-KEFS Color Word Effort Index: --- --- Within Expectation       Orientation:      Raw Score Percentile   NAB Orientation, Form 1 29/29 --- ---       Cognitive Screening:           Raw Score Percentile   SLUMS: 27/30 --- ---  Intellectual Functioning:           Standard Score Percentile   Test of Premorbid Functioning: 115 72 Average       Memory:          NAB Memory Module, Form 2: Standard Score/ T Score Percentile   Total Memory Index 91 27 Average  List Learning       Total Trials 1-3 29/36 (58) 79 Above Average    List B 3/12 (37) 9 Below Average    Short Delay Free Recall 8/12 (45) 31 Average    Long  Delay Free Recall 11/12 (59) 82 Above Average    Retention Percentage 138 (65) 93 Well Above Average    Recognition Discriminability 11 (56) 73 Average  Shape Learning       Total Trials 1-3 19/27 (54) 66 Average    Delayed Recall 5/9 (41) 18 Below Average    Retention Percentage 63 (38) 12 Below Average    Recognition Discriminability 9 (59) 82 Above Average  Story Learning       Immediate Recall 41/80 (42) 21 Below Average    Delayed Recall 20/40 (41) 18 Below Average    Retention Percentage 87 (48) 42 Average  Daily Living Memory       Immediate Recall 41/51 (41) 18 Below Average    Delayed Recall 12/17 (35) 7 Well Below Average    Retention Percentage 80 (42) 21 Below Average    Recognition Hits 10/10 (58) 79 Above Average       Attention/Executive Function:          Trail Making Test (TMT): Raw Score (T Score) Percentile     Part A 45 secs.,  0 errors (29) 2 Exceptionally Low    Part B 71 secs.,  0 errors (43) 25 Average         Standard Score/ Scaled Score Percentile   WAIS-IV Processing Speed Index: 79 8 Well Below Average    WAIS-IV Symbol Search 7 16 Below Average    WAIS-IV Coding 5 5 Well Below Average        Standard Score/ Scaled Score Percentile   WAIS-IV Working Memory Index: 97 42 Average    WAIS-IV Digit Span 8 25 Average      Forward 10 50 Average      Backward 8 25 Average      Sequencing 7 16 Below Average    WAIS-IV Arithmetic 11 63 Average       D-KEFS Color-Word Interference Test: Raw Score (Scaled Score) Percentile     Color Naming 43 secs. (4) 2 Well Below Average    Word Reading 30 secs. (6) 9 Below Average    Inhibition 85 secs. (5) 5 Well Below Average      Total Errors 2 errors (9) 37 Average    Inhibition/Switching 78 secs. (9) 37 Average      Total Errors 2 errors (10) 50 Average       D-KEFS Verbal Fluency Test: Raw Score (Scaled Score) Percentile     Letter Total Correct 36 (10) 50 Average    Category Total Correct 36 (9) 37  Average    Category Switching Total Correct 13 (10) 50 Average    Category Switching Accuracy 11 (9) 37 Average      Total Set Loss Errors 1 (11) 63 Average      Total Repetition Errors 2 (11) 63 Average       [place] Card Sorting Test: Raw Score  Percentile     Categories (trials) 4 (64) >16 Within Normal Limits    Total Errors 10 69 Average    Perseverative Errors 5 46 Average    Non-Perseverative Errors 5 54 Average    Failure to Maintain Set 0 --- ---       Language:          NAB Language Module, Form 1: T Score Percentile     Auditory Comprehension 54 66 Average    Naming 31/31 (52) 58 Average       Visuospatial/Visuoconstruction:      Raw Score Percentile   Clock Drawing: 10/10 --- Within Normal Limits       NAB Spatial Module, Form 1: T Score Percentile     Figure Drawing Copy 68 96 Well Above Average    Figure Drawing Immediate Recall 53 62 Average        Scaled Score Percentile   WAIS-IV Block Design: 12 75 Above Average  WAIS-IV Matrix Reasoning: 11 63 Average       Mood and Personality:      Raw Score Percentile   Beck Depression Inventory - II: 44 --- Severe  PROMIS Anxiety Questionnaire: 28 --- Severe       Additional Questionnaires:      Raw Score Percentile   PROMIS Sleep Disturbance Questionnaire: 37 --- Moderate   Informed Consent and Coding/Compliance:   Mr. Geiler was provided with a verbal description of the nature and purpose of the present neuropsychological evaluation. Also reviewed were the foreseeable risks and/or discomforts and benefits of the procedure, limits of confidentiality, and mandatory reporting requirements of this provider. The patient was given the opportunity to ask questions and receive answers about the evaluation. Oral consent to participate was provided by the patient.   This evaluation was conducted by Christia Reading, Ph.D., licensed clinical neuropsychologist. Mr. Reish completed a comprehensive clinical interview with Dr.  Melvyn Novas, billed as one unit [phone], and 160 minutes of cognitive testing and scoring, billed as one unit [phone] and four additional units 96139. Psychometrist Milana Kidney, B.S., assisted Dr. Melvyn Novas with test administration and scoring procedures. As a separate and discrete service, Dr. Melvyn Novas spent a total of 160 minutes in interpretation and report writing billed as one unit [phone] and two units 96133.

## 2020-04-01 ENCOUNTER — Encounter: Payer: Self-pay | Admitting: Psychology

## 2020-04-01 NOTE — Patient Instructions (Signed)
Clinical Impression(s): [name] pattern of performance is suggestive of a prominent weakness across processing speed. Additional variability was noted across more complex attention, as well as both encoding (i.e., learning) and retrieval aspects of verbal and visual memory. It is possible that scores in the average range represent a decline from a previously higher level of functioning given that prior IQ testing suggested intellectual abilities in the superior range. However, it is important to note that these scores were obtained during an assignment for a graduate level course and did not represent a formal clinical evaluation. Current premorbid intellectual estimates scored in the upper limits of the average range. Based upon current scores, performances were appropriate across domains of basic attention, executive functioning, receptive and expressive language, and visuospatial abilities. [name] denied difficulties completing instrumental activities of daily living (ADLs) independently.  Relative to his prior IQ testing, current results do suggest a significant decline across aspects of processing speed, as well as a more modest decline across complex attention/working memory. Regarding mood-related questionnaires, he scored in the severe range for both anxiety and depression, endorsing passive suicidal ideation without intent or plan within the past two weeks across the latter. He also reported moderate levels of sleep dysfunction.  Regarding etiology, there are several factors which should be considered. The most likely culprit for cognitive weakness/decline is pronounced psychiatric distress. Severe symptoms of depression and anxiety have been shown to directly influence cognitive functioning, especially across processing speed, complex attention, and encoding/retrieval aspects of memory. [name] pattern across testing is very consistent with these expectations. Additionally, ongoing sleep  dysfunction, chronic pain, and frequent headaches would also exacerbate these weaknesses and impact cognitive functioning across the same domains. Given his history of ADHD, it is also possible that ongoing mood, sleep, and pain symptoms are exacerbating an already dysregulated attentional network, creating worsening outward symptoms. Additionally, ongoing vascular factors, namely his history of uncontrolled type II diabetes could also be influencing cognitive functioning. While there was no neuroimaging available to review, vascular changes would affect similar areas of cognitive functioning mentioned above and this could be contributing to ongoing deficits. Despite variability across memory measures, this domain was largely intact. Given strong retention percentages and recognition scores, there is no concern for an early-onset neurodegenerative illness (e.g., Alzheimer's disease) at the present time. Continued medical monitoring will be important moving forward.

## 2020-04-01 NOTE — Progress Notes (Signed)
   Psychometrician Note   Cognitive testing was administered to Gerald Olson by Milana Kidney, B.S. (psychometrist) under the supervision of Dr. Christia Reading, Ph.D., licensed psychologist on 03/31/20. [name] did not appear overtly distressed by the testing session per behavioral observation or responses across self-report questionnaires. Dr. Christia Reading, Ph.D. checked in with [name] as needed to manage any distress related to testing procedures (if applicable). Rest breaks were offered.    The battery of tests administered was selected by Dr. Christia Reading, Ph.D. with consideration to [name] current level of functioning, the nature of his symptoms, emotional and behavioral responses during interview, level of literacy, observed level of motivation/effort, and the nature of the referral question. This battery was communicated to the psychometrist. Communication between Dr. Christia Reading, Ph.D. and the psychometrist was ongoing throughout the evaluation and Dr. Christia Reading, Ph.D. was immediately accessible at all times. Dr. Christia Reading, Ph.D. provided supervision to the psychometrist on the date of this service to the extent necessary to assure the quality of all services provided.    Gerald Olson will return within approximately 1-2 weeks for an interactive feedback session with Dr. Melvyn Novas at which time his test performances, clinical impressions, and treatment recommendations will be reviewed in detail. [name] understands he can contact our office should he require our assistance before this time.  A total of 160 minutes of billable time were spent face-to-face with [name] by the psychometrist. This includes both test administration and scoring time. Billing for these services is reflected in the clinical report generated by Dr. Christia Reading, Ph.D..  This note reflects time spent with the psychometrician and does not include test scores or any clinical  interpretations made by Dr. Melvyn Novas. The full report will follow in a separate note.

## 2020-04-04 ENCOUNTER — Ambulatory Visit: Payer: Medicaid Other | Admitting: Family Medicine

## 2020-04-08 MED FILL — TOPIRAMATE 25 MG TABS: 25 | 30 days supply | Qty: 30 | Fill #2

## 2020-04-08 MED FILL — LANTUS SOLOSTAR 100 UNITS/M: 100 | 25 days supply | Qty: 6 | Fill #1

## 2020-04-08 MED FILL — metFORMIN HCL ER 500 MG TB2: 500 | 30 days supply | Qty: 120 | Fill #0

## 2020-04-08 MED FILL — LISINOPRIL 20 MG TABLET: 20 | 90 days supply | Qty: 90 | Fill #0

## 2020-04-11 ENCOUNTER — Other Ambulatory Visit: Payer: Self-pay

## 2020-04-11 ENCOUNTER — Ambulatory Visit (INDEPENDENT_AMBULATORY_CARE_PROVIDER_SITE_OTHER): Payer: Medicaid Other | Admitting: Psychology

## 2020-04-11 DIAGNOSIS — F900 Attention-deficit hyperactivity disorder, predominantly inattentive type: Secondary | ICD-10-CM

## 2020-04-11 DIAGNOSIS — G8929 Other chronic pain: Secondary | ICD-10-CM

## 2020-04-11 DIAGNOSIS — F332 Major depressive disorder, recurrent severe without psychotic features: Secondary | ICD-10-CM

## 2020-04-11 DIAGNOSIS — F411 Generalized anxiety disorder: Secondary | ICD-10-CM

## 2020-04-11 NOTE — Patient Instructions (Signed)
Recommendations: A referral for neuroimaging in the form of a brain MRI should be considered to assess for any anatomical correlates for exhibited cognitive weaknesses.   A combination of medication and psychotherapy has been shown to be most effective at treating symptoms of anxiety and depression. As such, Mr. Rode is encouraged to speak with his prescribing physician regarding medication adjustments to optimally manage these symptoms. Likewise, Mr. Welliver is encouraged to consider engaging in short-term psychotherapy to address symptoms of psychiatric distress. He would benefit from an active and collaborative therapeutic environment, rather than one purely supportive in nature. Recommended treatment modalities include Cognitive Behavioral Therapy (CBT) or Acceptance and Commitment Therapy (ACT).  Active treatment surrounding pain management will be important moving forward. This can be accomplished with medications, psychotherapy, physical therapy, or a combination. He is encouraged to discuss these options with his PCP.    Mr. Strupp reported consuming on average 21-19 alcoholic beverages per week. The CDC defines heavy drinking as 15 or more drinks per week for males. As Mr. Corales would likely fall within this category of chronic heavy drinking, he is encouraged to decrease his alcohol intake. This would require him to find another appetite stimulant as he reported consuming alcohol prior to eating. Chronic heavy drinking behaviors have been linked to cognitive decline. Alcohol use is also likely contributing to ongoing sleep dysfunction. While this substance may aid in him relaxing and falling asleep, it will disrupt his sleep throughout the night and prevent him from spending an appropriate amount of time in deeper, more restorative levels. Additionally, while marijuana usage may help in initially falling asleep, there is evidence to suggest that it also interferes with one's ability to spend an  appropriate amount of time in deeper, more restorative levels of sleep.   In addition to high alcohol intake, Mr. Bloyd caffeine intake also seems extremely high. He would be encouraged to diminish this amount as this is also likely contributing to his ongoing sleep dysfunction.   Mr. Febles is encouraged to attend to lifestyle factors for brain health (e.g., regular physical exercise, good nutrition habits, regular participation in cognitively-stimulating activities, and general stress management techniques), which are likely to have benefits for both emotional adjustment and cognition. In fact, in addition to promoting good general health, regular exercise incorporating aerobic activities (e.g., brisk walking, jogging, cycling, etc.) has been demonstrated to be a very effective treatment for depression and stress, with similar efficacy rates to both antidepressant medication and psychotherapy. Optimal control of vascular risk factors (including safe cardiovascular exercise and adherence to dietary recommendations) is encouraged.   If interested, there are some activities which have therapeutic value and can be useful in keeping him cognitively stimulated. For suggestions, Mr. Peretz is encouraged to go to the following website: https://www.[facility].org/get-to-know-barrow/centers-programs/neurorehabilitation-center/neuro-rehab-apps-and-games/ which has options, categorized by level of difficulty. It should be noted that these activities should not be viewed as a substitute for therapy.  When learning new information, he would benefit from information being broken up into small, manageable pieces. He may also find it helpful to articulate the material in his own words and in a context to promote encoding at the onset of a new task. This material may need to be repeated multiple times to promote encoding.  Memory can be improved using internal strategies such as rehearsal, repetition, chunking,  mnemonics, association, and imagery. External strategies such as written notes in a consistently used memory journal, visual and nonverbal auditory cues such as a calendar on the refrigerator  or appointments with alarm, such as on a cell phone, can also help maximize recall.    To address problems with processing speed, he may wish to consider:   -Ensuring that he is alerted when essential material or instructions are being presented   -Adjusting the speed at which new information is presented   -Allowing additional processing time or a chance to rehearse novel information   -Allowing for more time in comprehending, processing, and responding in conversation  To address problems with fluctuating attention, he may wish to consider:   -Avoiding external distractions when needing to concentrate   -Limiting exposure to fast paced environments with multiple sensory demands   -Writing down complicated information and using checklists   -Attempting and completing one task at a time (i.e., no multi-tasking)   -Verbalizing aloud each step of a task to maintain focus   -Reducing the amount of information considered at one time

## 2020-04-11 NOTE — Addendum Note (Signed)
Addended by: Hazle Coca C on: 04/11/2020 04:10 PM   Modules accepted: Orders

## 2020-04-11 NOTE — Progress Notes (Signed)
   Neuropsychology Feedback Session Tillie Rung. [facility]  Reason for Referral:   Gerald Olson a 54 y.o. right-handed (unconfirmed history of being ambidextrous) Caucasian male referred by Freeman Caldron, PA-C,to characterize hiscurrent cognitive functioning and assist with diagnostic clarity and treatment planning in the context of subjective cognitive decline.  Feedback:   Gerald Olson completed a comprehensive neuropsychological evaluation on 03/31/2020. Please refer to that encounter for the full report and recommendations. Relative to his prior IQ testing, current results did suggest a significant decline across aspects of processing speed, as well as a more modest decline across complex attention/working memory. Regarding mood-related questionnaires, he scored in the severe range for both anxiety and depression, endorsing passive suicidal ideation without intent or plan within the past two weeks across the latter. Regarding etiology for cognitive dysfunction, there are several factors which should be considered. The most likely culprit for cognitive weakness/decline is pronounced psychiatric distress. Severe symptoms of depression and anxiety have been shown to directly influence cognitive functioning, especially across processing speed, complex attention, and encoding/retrieval aspects of memory. Gerald Olson pattern across testing is very consistent with these expectations. Additionally, ongoing sleep dysfunction, chronic pain, and frequent headaches would also exacerbate these weaknesses and impact cognitive functioning across the same domains. Given his history of ADHD, it is also possible that ongoing mood, sleep, and pain symptoms are exacerbating an already dysregulated attentional network, creating worsening outward symptoms.  Gerald Olson was unaccompanied during the current telephone call. He was within his residence while I was within my office.  Content of the current session focused on the results of his neuropsychological evaluation. Gerald Olson was given the opportunity to ask questions and his questions were answered. He was encouraged to reach out should additional questions arise. His report is available to him on MyChart.      32 minutes were spent conducting the current feedback session with Gerald Olson, billed as one unit [phone].

## 2020-04-27 ENCOUNTER — Encounter: Payer: Self-pay | Admitting: Adult Health

## 2020-04-27 ENCOUNTER — Other Ambulatory Visit: Payer: Self-pay

## 2020-04-27 ENCOUNTER — Ambulatory Visit: Payer: Medicaid Other | Admitting: Adult Health

## 2020-04-27 VITALS — BP 154/96 | HR 95 | Ht 73.0 in | Wt 243.0 lb

## 2020-04-27 DIAGNOSIS — M5416 Radiculopathy, lumbar region: Secondary | ICD-10-CM | POA: Diagnosis not present

## 2020-04-27 DIAGNOSIS — M792 Neuralgia and neuritis, unspecified: Secondary | ICD-10-CM | POA: Diagnosis not present

## 2020-04-27 DIAGNOSIS — E1142 Type 2 diabetes mellitus with diabetic polyneuropathy: Secondary | ICD-10-CM

## 2020-04-27 DIAGNOSIS — R4189 Other symptoms and signs involving cognitive functions and awareness: Secondary | ICD-10-CM

## 2020-04-27 MED ORDER — TOPIRAMATE 50 MG PO TABS: 50.0000 mg | ORAL_TABLET | Freq: Two times a day (BID) | ORAL | 5 refills | Status: DC

## 2020-04-27 MED ORDER — DULOXETINE HCL 30 MG PO CSDR: 30.0000 mg | DELAYED_RELEASE_CAPSULE | Freq: Every day | ORAL | 6 refills | Status: DC

## 2020-04-27 MED FILL — DULoxetine HCL 30 MG CPEP: 30 | 30 days supply | Qty: 30 | Fill #0

## 2020-04-27 NOTE — Patient Instructions (Addendum)
Your Plan:  Recommend increasing topamax to 50mg  twice daily  Ensure you follow up with Dr. [name] in regards to continued neck pain  Continue to follow with endocrinology for diabetic management  Recommend trialing duloxetine 30mg  daily  Recommend MRI brain w/wo contrast due to memory loss concerns       Follow up in 3 months or call earlier if needed       Thank you for coming to see [place] at [facility]. I hope we have been able to provide you high quality care today.  You may receive a patient satisfaction survey over the next few weeks. We would appreciate your feedback and comments so that we may continue to improve ourselves and the health of our patients.

## 2020-04-27 NOTE — Progress Notes (Signed)
[facility] [address] [place]. [place] [address] [phone]       OFFICE FOLLOW UP VISIT NOTE  Mr. KELLEN DUTCH Date of Birth:  07-27-1966 Medical Record Number:  [id number]   Referring MD: Cammie Fulp Reason for Referral: Neuropathy HPI:  Today, 04/27/2020, Mr. Desire returns today for neuropathy follow-up  Since prior visit, he has noticed some improvement in regards to right leg > left leg neuropathy but does report continued numbness sensation.  He believes Topamax 25 mg nightly has been beneficial and he has tolerated well.  He did trial taking 2 tablets for a couple of nights with further improvement and denies side effects.    He is also been having increased cervical pain radiating down left arm.  He has not contacted Dr. [name] in regards to worsening symptoms over the past 2 months.  He also reports right shoulder burning type pain that can wake him up while sleeping.  He reports he has not slept in the past 3 nights due to neck and shoulder pain.  He questions possibility of fibromyalgia due to multiple pain locations.  Awaiting lumbar and cervical procedure once diabetes becomes well controlled but he continues to have difficulty.  Continues to follow with endocrinology.   He also complains of cognitive concerns including short-term and long-term memory, delayed recall and occasional word finding difficulty.  He denies any prior stroke history or TBI.  He did have neurocognitive evaluation by Dr. Melvyn Novas on 03/31/2020 did show just chronic weakness across processing speed and variability noted approximately complex attention as well as encoding and retrieval aspects of verbal and visual memory.  Etiology potentially pronounced psychiatric distress, ongoing sleep dysfunction, chronic pain, history of ADHD, vascular factors such as uncontrolled diabetes and potentially vascular changes would affect similar areas of cognitive functioning which could contribute to  ongoing deficits.  No concerns for early onset neurodegenerative illness such as Alzheimer's disease at the time of testing. Recommendations of obtaining MRI for further evaluation as well as medication and psychotherapy for treatment of anxiety and depression as well as pain management and different lifestyle options (heavy alcohol intake, heavy caffeine intake, and THC use).     History provided for reference purposes only Update 01/28/2020 Dr. Leonie Man : He returns for follow-up after last visit 6 months ago.  He states that he did increase the gabapentin but was taking it only twice daily.  It helps with the pins-and-needles sensation but is still in constant pain as well as feels numb.  In the last few weeks he feels symptoms may gotten worse.  Is also gained about 20 pounds this year.  He also complains of involuntary myoclonic jerk-like movements in his left hand.  He had EMG nerve conduction study done on 10/22/2019 by Dr. 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  Lab works for reversible causes of neuropathy on 10/08/2019 pretty much normal except for low vitamin D levels of 114.  Patient states he has known low vitamin D level since last 20 years and was on vitamin D replacement however few weeks prior to seeing me and last visit here ran out of vitamin D and had not restarted taking it due to financial reasons.  He has chronic back pain with left leg radiculopathy and he plans on having back surgery but needs to lose some weight prior to that.  He has  been taking while like to switch seems to help his chronic pain.  He has not tried Topamax and is willing to try that.  Initial visit 10/08/2019 Dr. Leonie Man: [name] is a 54 year old Caucasian male with past medical history of diabetes, hypertension, obesity, degenerative arthritis and chronic back and neck pain who has been  having lower extremity paresthesias for the last 6 months which seem to be progressive.  He complains of constant burning, tingling and pins and needle sensation.  At times this becomes bearable.  This is present throughout the day.  He recently started taking gabapentin 100 mg twice daily which is not being effective.  He is also noticed that his balance is poor and has had frequent falls.  In fact he had a fall yesterday and today seems to be in acute pain and his gait is limited.  He has a longstanding history of degenerative cervical and lumbar spine disease.  He has been seen by orthopedic surgeon wants to operate on his neck but his diabetes is out of control and is working with endocrinologist to bring it under control so he can have the surgery.  Review of electronic medical records show that he had a cervical spine x-ray on 09/22/2019 which showed degenerative changes from C3-C7.  X-rays of lumbar spine on 07/21/2018 show significant facet degenerative changes at L5-S1 with significant foraminal narrowing on the right at the L4-5 and on the left at L5-S1.  He takes tramadol and muscle relaxant Robaxin which helps partially only.  He has not had any EMG nerve conduction studies done or lab work for other reversible causes of neuropathy done.  He states his feet are now constantly numb with lack of feeling which has been contributing to his falling.  His sugars have been persistently high and last A1c on 10/01/2019 was yet high at 7.6 but improved from 9.1 from 3 months ago.      ROS:   14 system review of systems is positive for see HPI and all other systems negative  PMH:  Past Medical History:  Diagnosis Date  . ADHD (attention deficit hyperactivity disorder)    Diagnosed during teenage years  . Alcohol use disorder    On average 14-21 drinks per week  . Alcoholic cirrhosis of liver    Per patient - condition is not related to moderate alcohol use but rather prolonged NSAID use and recent  abuse.  . Calculus of gallbladder without cholecystitis without obstruction   . Chronic pain    Neck/lower back  . Degenerative disc disease, lumbar   . Diabetic polyneuropathy   . Essential hypertension, benign   . Generalized anxiety disorder   . Glaucoma    Per patient report  . Headache   . 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 Major depressive disorder   . Other spondylosis with radiculopathy, cervical region   . Retinopathy    Right eye; per patient report  . Type 2 diabetes mellitus with hyperglycemia     Social History:  Social History   Socioeconomic History  . Marital status: Single    Spouse name: Not on file  . Number of children: Not on file  . Years of education: 13  . Highest education level: Master's degree (e.g., MA, MS, MEng, MEd, MSW, MBA)  Occupational History  . Not on file  Tobacco Use  . Smoking status: Current Every Day Smoker    Types:  E-cigarettes  . Smokeless tobacco: Never Used  . Tobacco comment: Currently vapes  Vaping Use  . Vaping Use: Never used  Substance and Sexual Activity  . Alcohol use: Yes    Alcohol/week: 14.0 - 21.0 standard drinks    Types: 14 - 21 Cans of beer per week    Comment: 2-3 beers per night on average; sometimes up to 6  . Drug use: Yes    Types: Marijuana    Comment: 1/week to help sleep; edibles, does not smoke  . Sexual activity: Yes  Other Topics Concern  . Not on file  Social History Narrative  . Not on file   Social Determinants of Health   Financial Resource Strain:   . Difficulty of Paying Living Expenses: Not on file  Food Insecurity:   . Worried About Charity fundraiser in the Last Year: Not on file  . Ran Out of Food in the Last Year: Not on file  Transportation Needs:   . Lack of Transportation (Medical): Not on file  . Lack of Transportation (Non-Medical): Not on file  Physical Activity:   . Days of Exercise per Week: Not on file  .  Minutes of Exercise per Session: Not on file  Stress:   . Feeling of Stress : Not on file  Social Connections:   . Frequency of Communication with Friends and Family: Not on file  . Frequency of Social Gatherings with Friends and Family: Not on file  . Attends Religious Services: Not on file  . Active Member of Clubs or Organizations: Not on file  . Attends Archivist Meetings: Not on file  . Marital Status: Not on file  Intimate Partner Violence:   . Fear of Current or Ex-Partner: Not on file  . Emotionally Abused: Not on file  . Physically Abused: Not on file  . Sexually Abused: Not on file    Medications:   Current Outpatient Medications on File Prior to Visit  Medication Sig Dispense Refill  . Accu-Chek FastClix Lancets MISC Use as instructed to check blood sugar up to 3 times daily. 102 each 11  . Ashwagandha 500 MG CAPS Take 1 capsule by mouth 3 (three) times daily. (Patient not taking: Reported on 03/04/2020)    . Blood Glucose Monitoring Suppl (ACCU-CHEK GUIDE ME) w/Device KIT 1 kit by Does not apply route 3 (three) times daily. 1 kit 0  . celecoxib (CELEBREX) 200 MG capsule Take 1 capsule (200 mg total) by mouth 2 (two) times daily. 60 capsule 1  . Cinnamon 500 MG capsule Take 1,000 mg by mouth 2 (two) times daily.    . diclofenac sodium (VOLTAREN) 1 % GEL Apply 1 application topically 3 (three) times daily as needed (pain). (Patient not taking: Reported on 03/04/2020)    . glucose blood (ACCU-CHEK GUIDE) test strip Use as instructed to check blood sugar up to 3 times daily. 100 each 12  . insulin glargine (LANTUS SOLOSTAR) 100 UNIT/ML Solostar Pen Inject 20 units [place] daily. Increase to 24 units after 1 week if fasting glucose >130. 15 mL 2  . lisinopril (ZESTRIL) 10 MG tablet Take 10 mg by mouth daily.    Marland Kitchen lisinopril (ZESTRIL) 20 MG tablet Take 1 tablet (20 mg total) by mouth daily. 90 tablet 0  . metFORMIN (GLUCOPHAGE-XR) 500 MG 24 hr tablet Take 2 tablets (1,000 mg  total) by mouth 2 (two) times daily with a meal. 120 tablet 2  . methocarbamol (ROBAXIN) 500 MG  tablet Take 2 tablets (1,000 mg total) by mouth every 6 (six) hours as needed for muscle spasms. 90 tablet 0  . Multiple Vitamins-Minerals (MULTIVITAMIN WITH MINERALS) tablet Take 1 tablet by mouth daily.    . naproxen sodium (ALEVE) 220 MG tablet Take 660 mg by mouth daily as needed (pain).    . Nutritional Supplements (DHEA PO) Take 100 mg by mouth daily.    . ondansetron (ZOFRAN) 4 MG tablet Take 1 tablet (4 mg total) by mouth every 8 (eight) hours as needed for nausea or vomiting. 20 tablet 0  . OVER THE COUNTER MEDICATION Black seed oil 2000per day    . topiramate (TOPAMAX) 25 MG tablet Take 1 tablet (25 mg total) by mouth at bedtime. 30 tablet 2  . TRUEPLUS PEN NEEDLES 32G X 4 MM MISC SMARTSIG:Injection Daily     No current facility-administered medications on file prior to visit.    Allergies:   Allergies  Allergen Reactions  . Hydrocodone Itching    Itching, face tingling, dizziness.  [name] [Oxycodone Hcl] Itching     Today's Vitals   04/27/20 1447 04/27/20 1448  BP: (!) 151/96 (!) 154/96  Pulse: 96 95  Weight: 243 lb (110.2 kg)   Height: 6' 1" (1.854 m)    Body mass index is 32.06 kg/m.   Physical Exam General: Obese middle-aged Caucasian male seated, in no evident distress Head: head normocephalic and atraumatic.   Neck: supple with no carotid or supraclavicular bruits Cardiovascular: regular rate and rhythm, no murmurs Musculoskeletal: Low back pain with tenderness.  Straight leg raising limited bilaterally. Skin:  no rash/petichiae Vascular:  Normal pulses all extremities  Neurologic Exam Mental Status: Awake and fully alert. Oriented to place and time. Recent and remote memory subjectively impaired. Attention span, concentration and fund of knowledge appropriate during visit. Mood and affect appropriate.  Occasional difficulty with word retrieval with increased  rate of verbal output Cranial Nerves: Pupils equal, briskly reactive to light. Extraocular movements full without nystagmus. Visual fields full to confrontation. Hearing intact. Facial sensation intact. Face, tongue, palate moves normally and symmetrically.  Motor: Normal bulk and tone. Normal strength in all tested extremity muscles.  Left upper extremity mild past-pointing and involuntary jerky movements. Sensory.:  Diminished   touch , pinprick sensation in both feet in the stocking areas down.  Impaired vibration in both feet from ankle down.  Position sense mildly impaired.  Romberg sign negative.  Fine action tremor of both outstretched upper extremities left greater than right.  No pill-rolling or cogwheel rigidity. Coordination: Rapid alternating movements normal in all extremities. Finger-to-nose and heel-to-shin performed accurately bilaterally. Gait and Station: Arises from chair with  difficulty. Stance is stooped and favors his back.  And left leg.  Broad-based antalgic gait uses a cane. Reflexes: 1+ and symmetric in upper extremities and absent in both lower extremities. Toes downgoing.       ASSESSMENT/PLAN: 54 year old Caucasian male with lower extremity paresthesias and pain due to worsening diabetic peripheral neuropathic pain.  He also has chronic back pain and left leg sciatica as well as increasing falls and gait difficulties due to degenerative lumbar and cervical spine disease.  Also complains of subjective cognitive deficits recently undergoing neurocognitive evaluation    Peripheral neuropathy, diabetic Lumbar radiculopathy Cervical pain -Encouraged follow-up with Dr. [name] orthopedic surgery -Discussed importance of routine follow-up with endocrinology and management of uncontrolled type 2 diabetes in hopes of pursuing further surgical needs -Increase Topamax to 50 mg twice  daily -Initiate duloxetine 30 mg daily -May benefit from pain management referral in the future  if indicated  Cognitive deficits -Concern for cognitive decline per recent neurocognitive evaluation -Main contributor likely psychiatric distress but also potentially vascular in nature -referred to psychotherapy by Dr. Melvyn Novas -Initiate duloxetine 30 mg daily (hopeful to help with depression as well as pain) -Obtain MRI brain w/wo contrast to assess for underlying abnormalities contributing    Follow-up in 3 months or call earlier if needed   I spent 40 minutes of face-to-face and non-face-to-face time with patient.  This included previsit chart review, lab review, study review, order entry, electronic health record documentation, patient education/discussion regarding ongoing pain and further treatment recommendations, cognitive deficits with review of neurocognitive evaluation and further discussion and answered all other questions to patient satisfaction   Frann Rider, [facility]  [facility] [address] [place], [place] [address]  Phone [phone] Fax [phone] Note: This document was prepared with digital dictation and possible smart phrase technology. Any transcriptional errors that result from this process are unintentional.

## 2020-04-28 ENCOUNTER — Telehealth: Payer: Self-pay | Admitting: Adult Health

## 2020-04-28 NOTE — Progress Notes (Signed)
I agree with the above plan 

## 2020-04-28 NOTE — Telephone Encounter (Signed)
[facility] healthy blue pending uploaded notes

## 2020-04-29 MED FILL — TRUEplus 5-BEVEL PEN NEEDLE: 32G X 4 MM | 34 days supply | Qty: 100 | Fill #2

## 2020-05-03 NOTE — Telephone Encounter (Signed)
[facility] healthy blue Josem Kaufmann: [id number] (exp. 04/28/20 to 06/27/20) order sent to GI. They will reach out to the patient to schedule.

## 2020-05-04 ENCOUNTER — Ambulatory Visit: Payer: Medicaid Other | Admitting: Endocrinology

## 2020-05-04 ENCOUNTER — Encounter: Payer: Self-pay | Admitting: Endocrinology

## 2020-05-04 ENCOUNTER — Ambulatory Visit (INDEPENDENT_AMBULATORY_CARE_PROVIDER_SITE_OTHER): Payer: Medicaid Other | Admitting: Orthopaedic Surgery

## 2020-05-04 ENCOUNTER — Encounter: Payer: Self-pay | Admitting: Orthopaedic Surgery

## 2020-05-04 VITALS — BP 122/70 | HR 118 | Ht 73.0 in | Wt 238.8 lb

## 2020-05-04 VITALS — BP 158/92 | HR 121 | Ht 73.0 in | Wt 243.0 lb

## 2020-05-04 DIAGNOSIS — M4802 Spinal stenosis, cervical region: Secondary | ICD-10-CM

## 2020-05-04 DIAGNOSIS — E1165 Type 2 diabetes mellitus with hyperglycemia: Secondary | ICD-10-CM | POA: Diagnosis not present

## 2020-05-04 DIAGNOSIS — M4722 Other spondylosis with radiculopathy, cervical region: Secondary | ICD-10-CM | POA: Diagnosis not present

## 2020-05-04 LAB — POCT GLYCOSYLATED HEMOGLOBIN (HGB A1C): Hemoglobin A1C: 7.3 % — AB (ref 4.0–5.6)

## 2020-05-04 MED ORDER — LANTUS SOLOSTAR 100 UNIT/ML ~~LOC~~ SOPN
18.0000 [IU] | PEN_INJECTOR | SUBCUTANEOUS | 2 refills | Status: DC
Start: 2020-05-04 — End: 2020-05-09

## 2020-05-04 MED ORDER — DAPAGLIFLOZIN PROPANEDIOL 5 MG PO TABS
5.0000 mg | ORAL_TABLET | Freq: Every day | ORAL | 3 refills | Status: DC
Start: 2020-05-04 — End: 2020-09-05

## 2020-05-04 NOTE — Patient Instructions (Signed)
good diet and exercise significantly improve the control of your diabetes.  please let me know if you wish to be referred to a dietician.  high blood sugar is very risky to your health.  you should see an eye doctor and dentist every year.  It is very important to get all recommended vaccinations.  Controlling your blood pressure and cholesterol drastically reduces the damage diabetes does to your body.  Those who smoke should quit.  Please discuss these with your doctor.  check your blood sugar twice a day.  vary the time of day when you check, between before the 3 meals, and at bedtime.  also check if you have symptoms of your blood sugar being too high or too low.  please keep a record of the readings and bring it to your next appointment here (or you can bring the meter itself).  You can write it on any piece of paper.  please call us sooner if your blood sugar goes below 70, or if you have a lot of readings over 200. I have sent a prescription to your pharmacy, to start Farxiga, and: Reduce the Lantus to 18 units each morning, and: Please continue the same metformin. Please come back for a follow-up appointment in 2 months.

## 2020-05-04 NOTE — Progress Notes (Signed)
Subjective:    Patient ID: Gerald Olson, male    DOB: [birth year], 54 y.o.   MRN: [id number]  HPI pt is referred by Dr Chapman Fitch, for diabetes.  Pt states DM was dx'ed in 2006 (he lost 70 lbs, so normoglycemia returned until 7829); it is complicated by PN, PAD, and DR; he has been on insulin since late 2020; pt says his diet is good, but exercise is limited by spinal probs; he has never had pancreatic surgery, severe hypoglycemia or DKA.  He did not tolerate Victoza (pancreatitis).  Main symptom is pain at the neck and lower back.  He says cbg varies from 138-270.  He needs to be cleared for C-spine surgery.   Past Medical History:  Diagnosis Date  . ADHD (attention deficit hyperactivity disorder)    Diagnosed during teenage years  . Alcohol use disorder    On average 14-21 drinks per week  . Alcoholic cirrhosis of liver    Per patient - condition is not related to moderate alcohol use but rather prolonged NSAID use and recent abuse.  . Calculus of gallbladder without cholecystitis without obstruction   . Chronic pain    Neck/lower back  . Degenerative disc disease, lumbar   . Diabetic polyneuropathy   . Essential hypertension, benign   . Generalized anxiety disorder   . Glaucoma    Per patient report  . Headache   . 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 Major depressive disorder   . Other spondylosis with radiculopathy, cervical region   . Retinopathy    Right eye; per patient report  . Type 2 diabetes mellitus with hyperglycemia     Past Surgical History:  Procedure Laterality Date  . CHOLECYSTECTOMY N/A 06/02/2018   Procedure: LAPAROSCOPIC CHOLECYSTECTOMY;  Surgeon: Vickie Epley, MD;  Location: [facility] ORS;  Service: General;  Laterality: N/A;  . KNEE ARTHROSCOPY     30 Years ago    Social History   Socioeconomic History  . Marital status: Single    Spouse name: Not on file  . Number of children: Not on file  . Years  of education: 77  . Highest education level: Master's degree (e.g., MA, MS, MEng, MEd, MSW, MBA)  Occupational History  . Not on file  Tobacco Use  . Smoking status: Current Every Day Smoker    Types: E-cigarettes  . Smokeless tobacco: Never Used  . Tobacco comment: Currently vapes  Vaping Use  . Vaping Use: Never used  Substance and Sexual Activity  . Alcohol use: Yes    Alcohol/week: 14.0 - 21.0 standard drinks    Types: 14 - 21 Cans of beer per week    Comment: 2-3 beers per night on average; sometimes up to 6  . Drug use: Yes    Types: Marijuana    Comment: 1/week to help sleep; edibles, does not smoke  . Sexual activity: Yes  Other Topics Concern  . Not on file  Social History Narrative  . Not on file   Social Determinants of Health   Financial Resource Strain:   . Difficulty of Paying Living Expenses: Not on file  Food Insecurity:   . Worried About Charity fundraiser in the Last Year: Not on file  . Ran Out of Food in the Last Year: Not on file  Transportation Needs:   . Lack of Transportation (Medical): Not on file  . Lack of Transportation (Non-Medical):  Not on file  Physical Activity:   . Days of Exercise per Week: Not on file  . Minutes of Exercise per Session: Not on file  Stress:   . Feeling of Stress : Not on file  Social Connections:   . Frequency of Communication with Friends and Family: Not on file  . Frequency of Social Gatherings with Friends and Family: Not on file  . Attends Religious Services: Not on file  . Active Member of Clubs or Organizations: Not on file  . Attends Archivist Meetings: Not on file  . Marital Status: Not on file  Intimate Partner Violence:   . Fear of Current or Ex-Partner: Not on file  . Emotionally Abused: Not on file  . Physically Abused: Not on file  . Sexually Abused: Not on file    Current Outpatient Medications on File Prior to Visit  Medication Sig Dispense Refill  . Accu-Chek FastClix Lancets MISC  Use as instructed to check blood sugar up to 3 times daily. (Patient taking differently: 1 each by Other route 2 (two) times daily. ) 102 each 11  . Blood Glucose Monitoring Suppl (ACCU-CHEK GUIDE ME) w/Device KIT 1 kit by Does not apply route 3 (three) times daily. (Patient taking differently: 1 kit by Does not apply route 2 (two) times daily. ) 1 kit 0  . DULoxetine HCl 30 MG CSDR Take 30 mg by mouth daily. 30 capsule 6  . glucose blood (ACCU-CHEK GUIDE) test strip Use as instructed to check blood sugar up to 3 times daily. (Patient taking differently: 1 each by Other route 2 (two) times daily. ) 100 each 12  . lisinopril (ZESTRIL) 20 MG tablet Take 1 tablet (20 mg total) by mouth daily. 90 tablet 0  . metFORMIN (GLUCOPHAGE-XR) 500 MG 24 hr tablet Take 2 tablets (1,000 mg total) by mouth 2 (two) times daily with a meal. 120 tablet 2  . methocarbamol (ROBAXIN) 500 MG tablet Take 2 tablets (1,000 mg total) by mouth every 6 (six) hours as needed for muscle spasms. 90 tablet 0  . Multiple Vitamins-Minerals (MULTIVITAMIN WITH MINERALS) tablet Take 1 tablet by mouth daily.    . naproxen sodium (ALEVE) 220 MG tablet Take 660 mg by mouth daily as needed (pain).    . Nutritional Supplements (DHEA PO) Take 100 mg by mouth daily.    Marland Kitchen OVER THE COUNTER MEDICATION Black seed oil 2000per day    . topiramate (TOPAMAX) 50 MG tablet Take 1 tablet (50 mg total) by mouth 2 (two) times daily. 60 tablet 5  . TRUEPLUS PEN NEEDLES 32G X 4 MM MISC 1 each by Other route daily.      No current facility-administered medications on file prior to visit.    Allergies  Allergen Reactions  . Hydrocodone Itching    Itching, face tingling, dizziness.  [name] [Oxycodone Hcl] Itching    Family History  Problem Relation Age of Onset  . Aneurysm Father   . Multiple sclerosis Sister   . Diabetes Neg Hx     BP 122/70   Pulse (!) 118   Ht $R'6\' 1"'Qd$  (1.854 m)   Wt 238 lb 12.8 oz (108.3 kg)   SpO2 95%   BMI 31.51 kg/m     Review of Systems denies recent weight loss, chest pain, sob, n/v, and polyuria.  He has chronic blurry vision and depression.      Objective:   Physical Exam VITAL SIGNS:  See vs page GENERAL: no  distress Pulses: dorsalis pedis intact bilat.   MSK: no deformity of the feet CV: 1+ bilat leg edema Skin:  no ulcer on the feet.  normal color and temp on the feet.   Neuro: sensation is intact to touch on the feet, but decreased from normal.    Lab Results  Component Value Date   HGBA1C 7.3 (A) 05/04/2020   Lab Results  Component Value Date   CREATININE 0.80 03/01/2020   BUN 13 01/20/2020   NA 133 (L) 01/20/2020   K 4.7 01/20/2020   CL 98 01/20/2020   CO2 20 01/20/2020   I have reviewed outside records, and summarized: Pt was noted to have elevated A1c, and referred here. HTN, anxiety, and neuropathic pain were also addressed      Assessment & Plan:  Insulin-requiring type 2 DM. He declines multiple daily injections Overweight: he should minimize insulin Pancreatitis, by hx.  He should avoid GLP rx.    Patient Instructions  good diet and exercise significantly improve the control of your diabetes.  please let me know if you wish to be referred to a dietician.  high blood sugar is very risky to your health.  you should see an eye doctor and dentist every year.  It is very important to get all recommended vaccinations.  Controlling your blood pressure and cholesterol drastically reduces the damage diabetes does to your body.  Those who smoke should quit.  Please discuss these with your doctor.  check your blood sugar twice a day.  vary the time of day when you check, between before the 3 meals, and at bedtime.  also check if you have symptoms of your blood sugar being too high or too low.  please keep a record of the readings and bring it to your next appointment here (or you can bring the meter itself).  You can write it on any piece of paper.  please call us sooner if your  blood sugar goes below 70, or if you have a lot of readings over 200. I have sent a prescription to your pharmacy, to start Farxiga, and: Reduce the Lantus to 18 units each morning, and: Please continue the same metformin. Please come back for a follow-up appointment in 2 months.

## 2020-05-04 NOTE — Progress Notes (Signed)
Office Visit Note   Patient: Gerald Olson           Date of Birth: [birth year]           MRN: [id number] Visit Date: 05/04/2020              Requested by: Antony Blackbird, MD [place],  [place] [address] PCP: Antony Blackbird, MD   Assessment & Plan: Visit Diagnoses:  1. Other spondylosis with radiculopathy, cervical region   2. Foraminal stenosis of cervical region     Plan: Patient having increasing symptoms in his neck has significant multilevel spondylosis most significant with left-sided hemicord compression was at C5-6 and C6-7 with probable small area of myelomalacia and moderate central stenosis.  I would recommend proceeding with a new MRI scan for comparison for preoperative planning and compared to his March MRI scan.  Follow-Up Instructions: Return after cervical MRI scan for preoperative discussion.  Orders:  No orders of the defined types were placed in this encounter.  No orders of the defined types were placed in this encounter.     Procedures: No procedures performed   Clinical Data: No additional findings.   Subjective: Chief Complaint  Patient presents with   Neck - Pain, Follow-up    HPI [age].-year-old male returns and states he has continued to have significant problems with neck pain with pain that radiates into both shoulders.  He is having trouble sleeping due to pain.  He has had numbness radiating down his left arm into his hand on the radial side of his hand.  He is not taking any make pain medication.  Past history of problems with alcohol abuse, diabetes, hypertension.  Last A1c was 7.3.  Patient states at times he feels like his legs are not strong and has fallen.  He denies loss of consciousness.  Past history of alcoholic cirrhosis and February 2021 c-Met showed normal liver tests other than ALT being elevated at 52.  Patient states he also has problems with low back pain but neck is certainly significantly  worse.  Patient states 1 he turns his neck at times when he is walking suddenly feels like electrical sensation shooting down to his lumbar region and into his leg and his leg gives way.  He describes this as electrical sharp and lasting just a second.  He is not sure if it is brought on with rotation of his neck sometimes it occurs when he is just walking on level ground.  Review of Systems positive for type 2 diabetes, history depression, anxiety, past history of alcohol abuse, ADHD, cervical stenosis.  All other systems are negative as pertains HPI.   Objective: Vital Signs: BP (!) 158/92    Pulse (!) 121    Ht _0  (1.854 m)    Wt 243 lb (110.2 kg)    BMI 32.06 kg/m   Physical Exam Constitutional:      Appearance: He is well-developed.  HENT:     Head: Normocephalic and atraumatic.  Eyes:     Pupils: Pupils are equal, round, and reactive to light.  Neck:     Thyroid: No thyromegaly.     Trachea: No tracheal deviation.  Cardiovascular:     Rate and Rhythm: Normal rate.  Pulmonary:     Effort: Pulmonary effort is normal.     Breath sounds: No wheezing.  Abdominal:     General: Bowel sounds are normal.     Palpations: Abdomen is  soft.  Skin:    General: Skin is warm and dry.     Capillary Refill: Capillary refill takes less than 2 seconds.  Neurological:     Mental Status: He is alert and oriented to person, place, and time.  Psychiatric:        Behavior: Behavior normal.        Thought Content: Thought content normal.        Judgment: Judgment normal.     Ortho Exam patient has bilateral brachial plexus tenderness.positive Lhermitte.  No lower extremity clonus normal heel toe gait he is able to walk on his toes and heels.  He has mild sciatic notch tenderness.  Anterior tib EHL gastrocsoleus is strong.  No biceps triceps weakness.  Negative shoulder impingement.  Only trace knee jerk ankle jerk bilaterally and symmetrical.  Specialty Comments:  No specialty comments  available.  Imaging: CLINICAL DATA:  Initial evaluation for chronic neck pain with left greater than right arm pain and numbness.  EXAM: MRI CERVICAL SPINE WITHOUT CONTRAST  TECHNIQUE: Multiplanar, multisequence MR imaging of the cervical spine was performed. No intravenous contrast was administered.  COMPARISON:  Prior radiograph from 09/22/2019.  FINDINGS: Alignment: Straightening of the normal cervical lordosis. Trace anterolisthesis of C7 on T1, with trace retrolisthesis of C4 on C5. Findings chronic and facet mediated.  Vertebrae: Vertebral body height maintained without evidence for acute or chronic fracture. Bone marrow signal intensity within normal limits. Subcentimeter benign hemangioma noted within the T2 vertebral body. No worrisome osseous lesions. Mild reactive edema seen about the left C7-T1 facet due to facet arthritis (series 9, image 11). No other abnormal marrow edema.  Cord: Subtle focus of T2 signal intensity seen within the left hemi cord at the level of C5-6, suspicious for a small focus of chronic myelomalacia (series 10, image 19). Signal intensity within the cervical spinal cord is otherwise within normal limits.  Posterior Fossa, vertebral arteries, paraspinal tissues: Visualized brain and posterior fossa within normal limits. Craniocervical junction normal. Paraspinous and prevertebral soft tissues within normal limits. Normal intravascular flow voids seen within the vertebral arteries bilaterally.  Disc levels:  C2-C3: Broad-based posterior disc protrusion flattens and partially effaces the ventral thecal sac, greater on the right. No significant spinal stenosis or cord deformity. Superimposed bilateral uncovertebral hypertrophy, worse on the right. Resultant moderate right with mild left C3 foraminal narrowing.  C3-C4: Chronic intervertebral disc space narrowing with diffuse degenerative disc osteophyte. Broad posterior component  flattens and effaces the ventral thecal sac and resultant mild-to-moderate spinal stenosis. Superimposed facet hypertrophy. Resultant severe bilateral C4 foraminal stenosis, slightly worse on the right.  C4-C5: Trace retrolisthesis. Chronic intervertebral disc space narrowing with diffuse degenerative disc osteophyte, slightly eccentric to the left. Broad posterior component flattens the ventral thecal sac. Superimposed mild facet hypertrophy. Resultant moderate canal with severe bilateral C5 foraminal stenosis.  C5-C6: Chronic intervertebral disc space narrowing with diffuse degenerative disc osteophyte. Broad left paracentral posterior component flattens and partially effaces the ventral thecal sac, greater on the left. Secondary flattening of the left hemi cord with probable small focus of myelomalacia as above. Moderate spinal stenosis. Severe left worse than right C6 foraminal narrowing.  C6-C7: Chronic intervertebral disc space narrowing with diffuse degenerative disc osteophyte, eccentric to the left. Flattening of the ventral thecal sac. Superimposed mild facet and ligament flavum hypertrophy. Resultant mild to moderate spinal stenosis with mild cord flattening. Severe left worse than right C7 foraminal stenosis.  C7-T1: Negative interspace. Moderate to advanced bilateral  facet hypertrophy, slightly worse on the right. Mild spinal stenosis, largely due to ligament flavum thickening. Mild bilateral C8 foraminal stenosis.  T1-2: Minimal disc bulge. Moderate bilateral facet hypertrophy. No spinal stenosis. Mild to moderate right worse than left foraminal narrowing.  T2-3: Small right foraminal disc protrusion (series 8, image 4). Superimposed facet hypertrophy. No spinal stenosis. Moderate right foraminal narrowing.  IMPRESSION: 1. Multilevel cervical spondylosis with resultant mild to moderate diffuse spinal stenosis at C3-4 through C6-7. 2. Multifactorial  degenerative changes with resultant severe bilateral C4 through C7 foraminal stenosis as above. 3. Subtle focus of T2 signal intensity within the left hemi cord at the level of C5-6, suspicious for a small focus of chronic myelomalacia.   Electronically Signed   By: Jeannine Boga M.D.   On: 11/22/2019 07:52   PMFS History: Patient Active Problem List   Diagnosis Date Noted   Foraminal stenosis of cervical region 05/09/2020   Diabetic polyneuropathy    ADHD (attention deficit hyperactivity disorder)    Generalized anxiety disorder    Major depressive disorder    Other spondylosis with radiculopathy, cervical region 09/22/2019   Low back pain 07/28/2019   Essential hypertension, benign 76/16/0737   Alcoholic cirrhosis of liver 06/02/2018   Type 2 diabetes mellitus with hyperglycemia 04/25/2018   Alcohol use disorder, mild, abuse 04/25/2018   Past Medical History:  Diagnosis Date   ADHD (attention deficit hyperactivity disorder)    Diagnosed during teenage years   Alcohol use disorder    On average 14-21 drinks per week   Alcoholic cirrhosis of liver    Per patient - condition is not related to moderate alcohol use but rather prolonged NSAID use and recent abuse.   Calculus of gallbladder without cholecystitis without obstruction    Chronic pain    Neck/lower back   Degenerative disc disease, lumbar    Diabetic polyneuropathy    Essential hypertension, benign    Generalized anxiety disorder    Glaucoma    Per patient report   Headache    History of concussion 2006   IED exposure while serving as [employer] in [place]   Major depressive disorder    Other spondylosis with radiculopathy, cervical region    Retinopathy    Right eye; per patient report   Type 2 diabetes mellitus with hyperglycemia     Family History  Problem Relation Age of Onset   Aneurysm Father    Multiple sclerosis Sister    Diabetes Neg Hx     Past  Surgical History:  Procedure Laterality Date   CHOLECYSTECTOMY N/A 06/02/2018   Procedure: LAPAROSCOPIC CHOLECYSTECTOMY;  Surgeon: Vickie Epley, MD;  Location: [facility] ORS;  Service: General;  Laterality: N/A;   KNEE ARTHROSCOPY     30 Years ago   Social History   Occupational History   Not on file  Tobacco Use   Smoking status: Current Every Day Smoker    Types: E-cigarettes   Smokeless tobacco: Never Used   Tobacco comment: Currently vapes  Vaping Use   Vaping Use: Never used  Substance and Sexual Activity   Alcohol use: Yes    Alcohol/week: 14.0 - 21.0 standard drinks    Types: 14 - 21 Cans of beer per week    Comment: 2-3 beers per night on average; sometimes up to 6   Drug use: Yes    Types: Marijuana    Comment: 1/week to help sleep; edibles, does not smoke   Sexual activity: Yes

## 2020-05-05 MED FILL — LANTUS SOLOSTAR 100 UNITS/M: 100 | 83 days supply | Qty: 15 | Fill #0

## 2020-05-06 ENCOUNTER — Ambulatory Visit: Payer: Medicaid Other | Admitting: Family

## 2020-05-09 ENCOUNTER — Other Ambulatory Visit: Payer: Self-pay | Admitting: Radiology

## 2020-05-09 ENCOUNTER — Encounter: Payer: Self-pay | Admitting: Critical Care Medicine

## 2020-05-09 ENCOUNTER — Other Ambulatory Visit: Payer: Self-pay

## 2020-05-09 ENCOUNTER — Ambulatory Visit: Payer: Medicaid Other | Attending: Family | Admitting: Critical Care Medicine

## 2020-05-09 DIAGNOSIS — M4802 Spinal stenosis, cervical region: Secondary | ICD-10-CM

## 2020-05-09 DIAGNOSIS — E1165 Type 2 diabetes mellitus with hyperglycemia: Secondary | ICD-10-CM | POA: Diagnosis not present

## 2020-05-09 DIAGNOSIS — M545 Low back pain, unspecified: Secondary | ICD-10-CM

## 2020-05-09 DIAGNOSIS — M4722 Other spondylosis with radiculopathy, cervical region: Secondary | ICD-10-CM

## 2020-05-09 DIAGNOSIS — I10 Essential (primary) hypertension: Secondary | ICD-10-CM

## 2020-05-09 MED ORDER — TRAMADOL HCL 50 MG PO TABS: 50.0000 mg | ORAL_TABLET | Freq: Three times a day (TID) | ORAL | 0 refills | Status: AC | PRN

## 2020-05-09 MED ORDER — LISINOPRIL 20 MG PO TABS: 20.0000 mg | ORAL_TABLET | Freq: Every day | ORAL | 0 refills | Status: DC

## 2020-05-09 MED ORDER — LANTUS SOLOSTAR 100 UNIT/ML ~~LOC~~ SOPN
18.0000 [IU] | PEN_INJECTOR | SUBCUTANEOUS | 2 refills | Status: DC
Start: 2020-05-09 — End: 2020-05-23

## 2020-05-09 MED FILL — traMADol HCL 50 MG TABS: 50 | 5 days supply | Qty: 15 | Fill #0

## 2020-05-09 MED FILL — TRUEPLUS PEN NDL 32GX5/32: 32G X 4 MM | 34 days supply | Qty: 100 | Fill #2

## 2020-05-09 NOTE — Assessment & Plan Note (Signed)
Hypertension stable at this time will maintain lisinopril 20 mg daily

## 2020-05-09 NOTE — Progress Notes (Signed)
Would like to address neck, back,  and shoulder pain. Unable to sleep secondary  To pain  Admits to taking family member pain medications.   Need refill on Rx.

## 2020-05-09 NOTE — Assessment & Plan Note (Signed)
Chronic pain on the basis of cervical spine spondylosis will prescribe tramadol 50 mg every 6 hours as needed for pain I did check the drug database last prescription was in January of this year the patient will be brought into the clinic in 2 weeks for a drug screen and to sign a pain management contract he has been cleared for planned cervical surgery healthy could proceed with this soon

## 2020-05-09 NOTE — Assessment & Plan Note (Signed)
>>  ASSESSMENT AND PLAN FOR ESSENTIAL HYPERTENSION, BENIGN WRITTEN ON 05/09/2020  5:04 PM BY WRIGHT, PATRICK E, MD  Hypertension stable at this time will maintain lisinopril 20 mg daily

## 2020-05-09 NOTE — Progress Notes (Signed)
Subjective:    Patient ID: Gerald Olson, male    DOB: 10/19/1965, 54 y.o.   MRN: [id number] Virtual Visit via Telephone Note  I connected with Gerald Olson on 05/09/20 at 11:00 AM EDT by telephone and verified that I am speaking with the correct person using two identifiers.   Consent:  I discussed the limitations, risks, security and privacy concerns of performing an evaluation and management service by telephone and the availability of in person appointments. I also discussed with the patient that there may be a patient responsible charge related to this service. The patient expressed understanding and agreed to proceed.  Location of patient: Patient is at home  Location of provider: I am in the office  Persons participating in the televisit with the patient.   No one else on the call    History of Present Illness: This is a 54 year old male primary care patient of Dr. Chapman Fitch seen by way of a phone visit because he was not able to achieve a video visit with his phone.  Patient has history of longstanding cervical and lumbar disc disease with an MRI in March of this year showing multilevel C-spine disease.  He has had increased neck pain over the past several months and is now off any pain medicine although he does state he occasionally borrows one of his friends vitamins.  The patient states he also is is seeking to have cervical spine surgery needs to have improved glycemic control before this.  He is here for medical clearance for same.  His A1c at his endocrinology office last week was 7.3 and he is now on the Lantus 18 units daily and Metformin twice daily and now beginning [place] which she is picking up prescriptions today for.  Patient's had a prior history of pancreatitis therefore he could not tolerate Ozempic.  He has had about a 20 pound weight gain on the insulin.  He is trying to follow to improve diet at this time.  He is having difficulty with sleep at this time as  well.  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  He does have a history of essential hypertension.  History of alcoholic cirrhosis no longer drinking.  Also patient has history of ADHD and generalized anxiety disorder along with depression as well.  The patient has received tramadol in the past from Dr. Chapman Fitch but does not have an active pain management contract on file.  When he saw orthopedics last week they indicated they wanted primary care to manage his pain     Past Medical History:  Diagnosis Date  . ADHD (attention deficit hyperactivity disorder)    Diagnosed during teenage years  . Alcohol use disorder    On average 14-21 drinks per week  . Alcoholic cirrhosis of liver    Per patient - condition is not related to moderate alcohol use but rather prolonged NSAID use and recent abuse.  . Calculus of gallbladder without cholecystitis without obstruction   . Chronic pain    Neck/lower back  . Degenerative disc disease, lumbar   . Diabetic polyneuropathy   . Essential hypertension, benign   . Generalized anxiety disorder   . Glaucoma    Per patient report  . Headache   . 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 Major depressive disorder   . Other spondylosis with radiculopathy, cervical region   .  Retinopathy    Right eye; per patient report  . Type 2 diabetes mellitus with hyperglycemia      Family History  Problem Relation Age of Onset  . Aneurysm Father   . Multiple sclerosis Sister   . Diabetes Neg Hx      Social History   Socioeconomic History  . Marital status: Single    Spouse name: Not on file  . Number of children: Not on file  . Years of education: 70  . Highest education level: Master's degree (e.g., MA, MS, MEng, MEd, MSW, MBA)  Occupational History  . Not on file  Tobacco Use  . Smoking status: Current Every Day Smoker    Types: E-cigarettes  . Smokeless  tobacco: Never Used  . Tobacco comment: Currently vapes  Vaping Use  . Vaping Use: Never used  Substance and Sexual Activity  . Alcohol use: Yes    Alcohol/week: 14.0 - 21.0 standard drinks    Types: 14 - 21 Cans of beer per week    Comment: 2-3 beers per night on average; sometimes up to 6  . Drug use: Yes    Types: Marijuana    Comment: 1/week to help sleep; edibles, does not smoke  . Sexual activity: Yes  Other Topics Concern  . Not on file  Social History Narrative  . Not on file   Social Determinants of Health   Financial Resource Strain:   . Difficulty of Paying Living Expenses: Not on file  Food Insecurity:   . Worried About Charity fundraiser in the Last Year: Not on file  . Ran Out of Food in the Last Year: Not on file  Transportation Needs:   . Lack of Transportation (Medical): Not on file  . Lack of Transportation (Non-Medical): Not on file  Physical Activity:   . Days of Exercise per Week: Not on file  . Minutes of Exercise per Session: Not on file  Stress:   . Feeling of Stress : Not on file  Social Connections:   . Frequency of Communication with Friends and Family: Not on file  . Frequency of Social Gatherings with Friends and Family: Not on file  . Attends Religious Services: Not on file  . Active Member of Clubs or Organizations: Not on file  . Attends Archivist Meetings: Not on file  . Marital Status: Not on file  Intimate Partner Violence:   . Fear of Current or Ex-Partner: Not on file  . Emotionally Abused: Not on file  . Physically Abused: Not on file  . Sexually Abused: Not on file     Allergies  Allergen Reactions  . Hydrocodone Itching    Itching, face tingling, dizziness.  [name] [Oxycodone Hcl] Itching     Outpatient Medications Prior to Visit  Medication Sig Dispense Refill  . Accu-Chek FastClix Lancets MISC Use as instructed to check blood sugar up to 3 times daily. (Patient taking differently: 1 each by Other route  2 (two) times daily. ) 102 each 11  . ALPHA-LIPOIC ACID PO Take by mouth.    . Blood Glucose Monitoring Suppl (ACCU-CHEK GUIDE ME) w/Device KIT 1 kit by Does not apply route 3 (three) times daily. (Patient taking differently: 1 kit by Does not apply route 2 (two) times daily. ) 1 kit 0  . calcium-vitamin D (OSCAL WITH D) 500-200 MG-UNIT tablet Take 1 tablet by mouth.    . DULoxetine HCl 30 MG CSDR Take 30 mg by mouth  daily. 30 capsule 6  . Flaxseed, Linseed, (FLAX SEED OIL) 1000 MG CAPS Take by mouth.    Marland Kitchen glucose blood (ACCU-CHEK GUIDE) test strip Use as instructed to check blood sugar up to 3 times daily. (Patient taking differently: 1 each by Other route 2 (two) times daily. ) 100 each 12  . metFORMIN (GLUCOPHAGE-XR) 500 MG 24 hr tablet Take 2 tablets (1,000 mg total) by mouth 2 (two) times daily with a meal. 120 tablet 2  . topiramate (TOPAMAX) 50 MG tablet Take 1 tablet (50 mg total) by mouth 2 (two) times daily. 60 tablet 5  . TRUEPLUS PEN NEEDLES 32G X 4 MM MISC 1 each by Other route daily.     Marland Kitchen zinc gluconate 50 MG tablet Take 50 mg by mouth daily.    . insulin glargine (LANTUS SOLOSTAR) 100 UNIT/ML Solostar Pen Inject 18 Units into the skin every morning. And pen needles 1/day 15 mL 2  . lisinopril (ZESTRIL) 20 MG tablet Take 1 tablet (20 mg total) by mouth daily. 90 tablet 0  . dapagliflozin propanediol (FARXIGA) 5 MG TABS tablet Take 1 tablet (5 mg total) by mouth daily before breakfast. (Patient not taking: Reported on 05/09/2020) 90 tablet 3  . methocarbamol (ROBAXIN) 500 MG tablet Take 2 tablets (1,000 mg total) by mouth every 6 (six) hours as needed for muscle spasms. (Patient not taking: Reported on 05/09/2020) 90 tablet 0  . Multiple Vitamins-Minerals (MULTIVITAMIN WITH MINERALS) tablet Take 1 tablet by mouth daily. (Patient not taking: Reported on 05/09/2020)    . Nutritional Supplements (DHEA PO) Take 100 mg by mouth daily.    Marland Kitchen OVER THE COUNTER MEDICATION Black seed oil 2000per day     . naproxen sodium (ALEVE) 220 MG tablet Take 660 mg by mouth daily as needed (pain). (Patient not taking: Reported on 05/09/2020)     No facility-administered medications prior to visit.     Review of Systems  HENT: Negative.   Respiratory: Negative.   Cardiovascular: Negative.   Gastrointestinal: Negative.   Genitourinary: Negative.   Musculoskeletal: Positive for back pain, gait problem, neck pain and neck stiffness.  Skin: Negative.   Hematological: Negative.   Psychiatric/Behavioral: Positive for decreased concentration, dysphoric mood and sleep disturbance.       Objective:   Physical Exam   Phone visit, no exam      Assessment & Plan:  I personally reviewed all images and lab data in the [facility] system as well as any outside material available during this office visit and agree with the  radiology impressions.   Essential hypertension, benign Hypertension stable at this time will maintain lisinopril 20 mg daily  Type 2 diabetes mellitus with hyperglycemia Type 2 diabetes with hyperglycemia and diabetic polyneuropathy will maintain Lantus at current dose level and continue metformin as prescribed and now the patient will pick up his [place] prescription at this visit  Other spondylosis with radiculopathy, cervical region Chronic pain on the basis of cervical spine spondylosis will prescribe tramadol 50 mg every 6 hours as needed for pain I did check the drug database last prescription was in January of this year the patient will be brought into the clinic in 2 weeks for a drug screen and to sign a pain management contract he has been cleared for planned cervical surgery healthy could proceed with this soon  Low back pain Chronic low back pain also as a result of prior injuries  This will need to be evaluated at a  later date the cervical spine is the primary issue now   Kristofor was seen today for pain.  Diagnoses and all orders for this visit:  Essential  hypertension -     lisinopril (ZESTRIL) 20 MG tablet; Take 1 tablet (20 mg total) by mouth daily.  Essential hypertension, benign  Type 2 diabetes mellitus with hyperglycemia  Other spondylosis with radiculopathy, cervical region  Low back pain, unspecified back pain laterality, unspecified chronicity, unspecified whether sciatica present  Other orders -     insulin glargine (LANTUS SOLOSTAR) 100 UNIT/ML Solostar Pen; Inject 18 Units into the skin every morning. And pen needles 1/day -     traMADol (ULTRAM) 50 MG tablet; Take 1 tablet (50 mg total) by mouth every 8 (eight) hours as needed for up to 5 days.   Follow Up Instructions: Patient instructed to pick up his insulin tramadol refills along with lisinopril and return to the office in 2 weeks for a physical exam   I discussed the assessment and treatment plan with the patient. The patient was provided an opportunity to ask questions and all were answered. The patient agreed with the plan and demonstrated an understanding of the instructions.   The patient was advised to call back or seek an in-person evaluation if the symptoms worsen or if the condition fails to improve as anticipated.  I provided 30 minutes of non-face-to-face time during this encounter  including  median intraservice time , review of notes, labs, imaging, medications  and explaining diagnosis and management to the patient .    Asencion Noble, MD

## 2020-05-09 NOTE — Assessment & Plan Note (Signed)
Type 2 diabetes with hyperglycemia and diabetic polyneuropathy will maintain Lantus at current dose level and continue metformin as prescribed and now the patient will pick up his Farxiga prescription at this visit

## 2020-05-09 NOTE — Assessment & Plan Note (Signed)
Chronic low back pain also as a result of prior injuries  This will need to be evaluated at a later date the cervical spine is the primary issue now

## 2020-05-11 ENCOUNTER — Telehealth: Payer: Self-pay

## 2020-05-11 NOTE — Telephone Encounter (Signed)
PRIOR AUTHORIZATION  Medication: Farxiga 5 mg  PA request form has been given to Dr. Loanne [name] for him to address whether or not he would like to proceed with PA or if he would rather change Rx to a covered alternative. Will await his response

## 2020-05-11 NOTE — Telephone Encounter (Signed)
Following message sent to pt via My Chart:  [place],  We received notification from [facility] that they do not cover [place]. Unfortunately, [employer] failed to inform our office what alternative is covered under your plan. This information has been provided to Dr. Loanne [name] for him to review and address. He advised that you will need to call your insurance to determine if [place] or [place] is covered. We will await your response.  Thank you

## 2020-05-17 ENCOUNTER — Encounter: Payer: Self-pay | Admitting: Endocrinology

## 2020-05-17 ENCOUNTER — Other Ambulatory Visit: Payer: Self-pay

## 2020-05-17 ENCOUNTER — Ambulatory Visit (INDEPENDENT_AMBULATORY_CARE_PROVIDER_SITE_OTHER): Payer: Medicaid Other | Admitting: Endocrinology

## 2020-05-17 VITALS — BP 158/98 | HR 112 | Ht 73.0 in | Wt 237.2 lb

## 2020-05-17 DIAGNOSIS — E1165 Type 2 diabetes mellitus with hyperglycemia: Secondary | ICD-10-CM

## 2020-05-17 LAB — POCT GLUCOSE (DEVICE FOR HOME USE): POC Glucose: 225 mg/dl — AB (ref 70–99)

## 2020-05-17 NOTE — Progress Notes (Signed)
PATIENT ASSISTANCE PROGRAM  PROVIDER SECTION OF APPLICATION Company: [employer]  Medication(s) ordered: [place] 10mg  Document: Rx  PATIENT SECTION OF APPLICATION PATIENT PORTION of the application completed and signed by pt? Yes Did pt provide a copy of his financials? No Is patient aware that missing or incomplete information will be his responsibility to provide to the above mentioned company? Yes   All above information has been faxed successfully to the Company listed above. Documents and fax confirmation have been retained for our future reference.

## 2020-05-17 NOTE — Patient Instructions (Addendum)
check your blood sugar twice a day.  vary the time of day when you check, between before the 3 meals, and at bedtime.  also check if you have symptoms of your blood sugar being too high or too low.  please keep a record of the readings and bring it to your next appointment here (or you can bring the meter itself).  You can write it on any piece of paper.  please call us sooner if your blood sugar goes below 70, or if you have a lot of readings over 200. I have sent a prescription to your pharmacy, to start Farxiga, and:  Reduce the Lantus to 18 units each morning, and: Please continue the same metformin.  Please come back for a follow-up appointment as scheduled.

## 2020-05-17 NOTE — Progress Notes (Signed)
Subjective:    Patient ID: Gerald Olson, male    DOB: July 30, 1966, 54 y.o.   MRN: [id number]  HPI Pt returns for f/u of diabetes mellitus: DM type: Insulin-requiring type 2 Dx'ed: 2006 (he lost 70 lbs, so normoglycemia returned until 4163) Complications: PN, PAD, and DR Therapy: insulin since 2020 DKA: never Severe hypoglycemia: never Pancreatitis: once (2021), on Victoza Pancreatic imaging: normal on 2021 CT SDOH: he gets insulin from pt assist Other: He needs to be cleared for C-spine surgery; He declines multiple daily injections Interval history: no cbg record, but states cbg's vary from 87-310.  There is no trend throughout the day.  He has not yet made adjustments advised at last OV, because medicaid did not pay.  Pt brings pt assist forms.  pt states diaphoresis today.   Past Medical History:  Diagnosis Date  . ADHD (attention deficit hyperactivity disorder)    Diagnosed during teenage years  . Alcohol use disorder    On average 14-21 drinks per week  . Alcoholic cirrhosis of liver    Per patient - condition is not related to moderate alcohol use but rather prolonged NSAID use and recent abuse.  . Calculus of gallbladder without cholecystitis without obstruction   . Chronic pain    Neck/lower back  . Degenerative disc disease, lumbar   . Diabetic polyneuropathy   . Essential hypertension, benign   . Generalized anxiety disorder   . Glaucoma    Per patient report  . Headache   . 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 Major depressive disorder   . Other spondylosis with radiculopathy, cervical region   . Retinopathy    Right eye; per patient report  . Type 2 diabetes mellitus with hyperglycemia     Past Surgical History:  Procedure Laterality Date  . CHOLECYSTECTOMY N/A 06/02/2018   Procedure: LAPAROSCOPIC CHOLECYSTECTOMY;  Surgeon: Vickie Epley, MD;  Location: [facility] ORS;  Service: General;  Laterality: N/A;   . KNEE ARTHROSCOPY     30 Years ago    Social History   Socioeconomic History  . Marital status: Single    Spouse name: Not on file  . Number of children: Not on file  . Years of education: 51  . Highest education level: Master's degree (e.g., MA, MS, MEng, MEd, MSW, MBA)  Occupational History  . Not on file  Tobacco Use  . Smoking status: Current Every Day Smoker    Types: E-cigarettes  . Smokeless tobacco: Never Used  . Tobacco comment: Currently vapes  Vaping Use  . Vaping Use: Never used  Substance and Sexual Activity  . Alcohol use: Yes    Alcohol/week: 14.0 - 21.0 standard drinks    Types: 14 - 21 Cans of beer per week    Comment: 2-3 beers per night on average; sometimes up to 6  . Drug use: Yes    Types: Marijuana    Comment: 1/week to help sleep; edibles, does not smoke  . Sexual activity: Yes  Other Topics Concern  . Not on file  Social History Narrative  . Not on file   Social Determinants of Health   Financial Resource Strain:   . Difficulty of Paying Living Expenses: Not on file  Food Insecurity:   . Worried About Charity fundraiser in the Last Year: Not on file  . Ran Out of Food in the Last Year: Not on file  Transportation  Needs:   . Lack of Transportation (Medical): Not on file  . Lack of Transportation (Non-Medical): Not on file  Physical Activity:   . Days of Exercise per Week: Not on file  . Minutes of Exercise per Session: Not on file  Stress:   . Feeling of Stress : Not on file  Social Connections:   . Frequency of Communication with Friends and Family: Not on file  . Frequency of Social Gatherings with Friends and Family: Not on file  . Attends Religious Services: Not on file  . Active Member of Clubs or Organizations: Not on file  . Attends Archivist Meetings: Not on file  . Marital Status: Not on file  Intimate Partner Violence:   . Fear of Current or Ex-Partner: Not on file  . Emotionally Abused: Not on file  .  Physically Abused: Not on file  . Sexually Abused: Not on file    Current Outpatient Medications on File Prior to Visit  Medication Sig Dispense Refill  . Accu-Chek FastClix Lancets MISC Use as instructed to check blood sugar up to 3 times daily. (Patient taking differently: 1 each by Other route 2 (two) times daily. ) 102 each 11  . ALPHA-LIPOIC ACID PO Take by mouth.    . Blood Glucose Monitoring Suppl (ACCU-CHEK GUIDE ME) w/Device KIT 1 kit by Does not apply route 3 (three) times daily. (Patient taking differently: 1 kit by Does not apply route 2 (two) times daily. ) 1 kit 0  . calcium-vitamin D (OSCAL WITH D) 500-200 MG-UNIT tablet Take 1 tablet by mouth.    . dapagliflozin propanediol (FARXIGA) 5 MG TABS tablet Take 1 tablet (5 mg total) by mouth daily before breakfast. 90 tablet 3  . DULoxetine HCl 30 MG CSDR Take 30 mg by mouth daily. 30 capsule 6  . glucose blood (ACCU-CHEK GUIDE) test strip Use as instructed to check blood sugar up to 3 times daily. (Patient taking differently: 1 each by Other route 2 (two) times daily. ) 100 each 12  . insulin glargine (LANTUS SOLOSTAR) 100 UNIT/ML Solostar Pen Inject 18 Units into the skin every morning. And pen needles 1/day 15 mL 2  . lisinopril (ZESTRIL) 20 MG tablet Take 1 tablet (20 mg total) by mouth daily. 90 tablet 0  . metFORMIN (GLUCOPHAGE-XR) 500 MG 24 hr tablet Take 2 tablets (1,000 mg total) by mouth 2 (two) times daily with a meal. 120 tablet 2  . methocarbamol (ROBAXIN) 500 MG tablet Take 2 tablets (1,000 mg total) by mouth every 6 (six) hours as needed for muscle spasms. 90 tablet 0  . Multiple Vitamins-Minerals (MULTIVITAMIN WITH MINERALS) tablet Take 1 tablet by mouth daily.     . Nutritional Supplements (DHEA PO) Take 100 mg by mouth daily.    Marland Kitchen OVER THE COUNTER MEDICATION Black seed oil 2000per day    . topiramate (TOPAMAX) 50 MG tablet Take 1 tablet (50 mg total) by mouth 2 (two) times daily. 60 tablet 5  . TRUEPLUS PEN NEEDLES  32G X 4 MM MISC 1 each by Other route daily.     Marland Kitchen zinc gluconate 50 MG tablet Take 50 mg by mouth daily.     No current facility-administered medications on file prior to visit.    Allergies  Allergen Reactions  . Hydrocodone Itching    Itching, face tingling, dizziness.  [name] [Oxycodone Hcl] Itching    Family History  Problem Relation Age of Onset  . Aneurysm Father   .  Multiple sclerosis Sister   . Diabetes Neg Hx     BP (!) 158/98   Pulse (!) 112   Ht $R'6\' 1"'eI$  (1.854 m)   Wt 237 lb 3.2 oz (107.6 kg)   SpO2 98%   BMI 31.29 kg/m   Review of Systems He denies hypoglycemia.      Objective:   Physical Exam VITAL SIGNS:  See vs page GENERAL: no distress  Lab Results  Component Value Date   HGBA1C 7.3 (A) 05/04/2020       Assessment & Plan:  Insulin-requiring type 2 DM, with PAD: this is the best control this pt should aim for, given this regimen, which does match insulin to his changing needs throughout the day  Patient Instructions  check your blood sugar twice a day.  vary the time of day when you check, between before the 3 meals, and at bedtime.  also check if you have symptoms of your blood sugar being too high or too low.  please keep a record of the readings and bring it to your next appointment here (or you can bring the meter itself).  You can write it on any piece of paper.  please call us sooner if your blood sugar goes below 70, or if you have a lot of readings over 200. I have sent a prescription to your pharmacy, to start Farxiga, and:  Reduce the Lantus to 18 units each morning, and: Please continue the same metformin.  Please come back for a follow-up appointment as scheduled.

## 2020-05-17 NOTE — Progress Notes (Signed)
Pt presented for appt with Dr. Loanne [name] today. While in the exam room with patient obtaining his vitals, pt felt very hot and skin clammy to the touch. When asked how patient was feeling, he states the reason he was feeling diaphoretic was d/t pain. Dr. Loanne [name] was in the exam room and asked that patient's CBG be checked. Results = 225

## 2020-05-18 ENCOUNTER — Ambulatory Visit: Payer: Medicaid Other | Admitting: Adult Health

## 2020-05-23 ENCOUNTER — Other Ambulatory Visit: Payer: Self-pay

## 2020-05-23 ENCOUNTER — Encounter: Payer: Self-pay | Admitting: Critical Care Medicine

## 2020-05-23 ENCOUNTER — Ambulatory Visit: Payer: Medicaid Other | Attending: Critical Care Medicine | Admitting: Critical Care Medicine

## 2020-05-23 VITALS — BP 133/73 | HR 102 | Temp 98.5°F | Resp 16 | Wt 232.8 lb

## 2020-05-23 DIAGNOSIS — M4802 Spinal stenosis, cervical region: Secondary | ICD-10-CM | POA: Diagnosis not present

## 2020-05-23 DIAGNOSIS — M5412 Radiculopathy, cervical region: Secondary | ICD-10-CM

## 2020-05-23 DIAGNOSIS — Z114 Encounter for screening for human immunodeficiency virus [HIV]: Secondary | ICD-10-CM | POA: Diagnosis not present

## 2020-05-23 DIAGNOSIS — Z1159 Encounter for screening for other viral diseases: Secondary | ICD-10-CM | POA: Diagnosis not present

## 2020-05-23 DIAGNOSIS — E1165 Type 2 diabetes mellitus with hyperglycemia: Secondary | ICD-10-CM

## 2020-05-23 DIAGNOSIS — R296 Repeated falls: Secondary | ICD-10-CM | POA: Diagnosis not present

## 2020-05-23 DIAGNOSIS — I10 Essential (primary) hypertension: Secondary | ICD-10-CM

## 2020-05-23 DIAGNOSIS — G894 Chronic pain syndrome: Secondary | ICD-10-CM

## 2020-05-23 DIAGNOSIS — Z1211 Encounter for screening for malignant neoplasm of colon: Secondary | ICD-10-CM

## 2020-05-23 LAB — GLUCOSE, POCT (MANUAL RESULT ENTRY): POC Glucose: 248 mg/dl — AB (ref 70–99)

## 2020-05-23 MED ORDER — DULOXETINE HCL 60 MG PO CPEP: 60.0000 mg | ORAL_CAPSULE | Freq: Every day | ORAL | 3 refills | Status: DC

## 2020-05-23 MED ORDER — TOPIRAMATE 50 MG PO TABS
50.0000 mg | ORAL_TABLET | Freq: Two times a day (BID) | ORAL | 5 refills | Status: DC
Start: 2020-05-23 — End: 2020-08-02

## 2020-05-23 MED ORDER — METHOCARBAMOL 500 MG PO TABS: 1000.0000 mg | ORAL_TABLET | Freq: Four times a day (QID) | ORAL | 0 refills | Status: DC | PRN

## 2020-05-23 MED ORDER — LANTUS SOLOSTAR 100 UNIT/ML ~~LOC~~ SOPN
22.0000 [IU] | PEN_INJECTOR | SUBCUTANEOUS | 2 refills | Status: DC
Start: 2020-05-23 — End: 2020-08-02

## 2020-05-23 MED ORDER — TRAMADOL HCL 50 MG PO TABS: 50.0000 mg | ORAL_TABLET | Freq: Three times a day (TID) | ORAL | 0 refills | Status: AC | PRN

## 2020-05-23 MED FILL — METHOCARBAMOL 500 MG TABS: 500 | 11 days supply | Qty: 90 | Fill #0

## 2020-05-23 MED FILL — TOPIRAMATE 50 MG TABLET: 50 | 30 days supply | Qty: 60 | Fill #0

## 2020-05-23 MED FILL — DULoxetine HCL 60 MG CPEP: 60 | 30 days supply | Qty: 30 | Fill #0

## 2020-05-23 MED FILL — traMADol HCL 50 MG TABS: 50 | 5 days supply | Qty: 15 | Fill #0

## 2020-05-23 NOTE — Patient Instructions (Addendum)
Please consider getting a Covid vaccine COVID-19 Vaccine Information can be found at: ShippingScam.co.uk For questions related to vaccine distribution or appointments, please email [email or web address] or call [phone].    A stool collection kit was given to screen for colon cancer  Stop by the lab and deliver a urine sample for drug screen and will also obtain hepatitis C and HIV assay  A pneumococcal vaccine was recommended  You declined a flu vaccine  Follow-up with Dr. Loanne [name] on the [place] on the Robaxin, tramadol, Topamax and Cymbalta were given  Cymbalta dose was increased to 60 mg daily  Return in 2 months to see Dr. Chapman Fitch

## 2020-05-23 NOTE — Assessment & Plan Note (Signed)
After surgery in the neck the patient would benefit from physical therapy

## 2020-05-23 NOTE — Assessment & Plan Note (Signed)
Per Dr. Loanne [name] of endocrinology awaiting for [facility] initiation

## 2020-05-23 NOTE — Assessment & Plan Note (Signed)
Good control

## 2020-05-23 NOTE — Assessment & Plan Note (Signed)
Per Dr. [name]

## 2020-05-23 NOTE — Progress Notes (Signed)
Subjective:    Patient ID: Gerald Olson Olson, male    DOB: 11/26/1965, 54 y.o.   MRN: [id number]  History of Present Illness: 05/09/20 This is a 54 year old male primary care patient of Dr. Chapman Olson seen by way of a phone visit because he was not able to achieve a video visit with his phone.  Patient has history of longstanding cervical and lumbar disc disease with an MRI in March of this year showing multilevel C-spine disease.  He has had increased neck pain over the past several months and is now off any pain medicine although he does state he occasionally borrows one of his friends vitamins.  The patient states he also is is seeking to have cervical spine surgery needs to have improved glycemic control before this.  He is here for medical clearance for same.  His A1c at his endocrinology office last week was 7.3 and he is now on the Lantus 18 units daily and Metformin twice daily and now beginning [place] which she is picking up prescriptions today for.  Patient's had a prior history of pancreatitis therefore he could not tolerate Ozempic.  He has had about a 20 pound weight gain on the insulin.  He is trying to follow to improve diet at this time.  He is having difficulty with sleep at this time as well.  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  He does have a history of essential hypertension.  History of alcoholic cirrhosis no longer drinking.  Also patient has history of ADHD and generalized anxiety disorder along with depression as well.  The patient has received tramadol in the past from Dr. Chapman Olson but does not have an active pain management contract on file.  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  Patient has an upcoming MRI and follow-up  surgery plan with Dr. Lorin Olson on his cervical spine.  Patient also has severe diabetes walks in today with a hemoglobin A1c greater than 7.  He follows with Dr. Loanne Olson of endocrine.  He is been prescribed for Jicha but is yet to start this is waiting patient assistance.  On arrival today blood sugar was 248. Blood pressures under good control at this time on lisinopril.  The patient does have a walker at home but does not use this on a regular basis.     Past Medical History:  Diagnosis Date  . ADHD (attention deficit hyperactivity disorder)    Diagnosed during teenage years  . Alcohol use disorder    On average 14-21 drinks per week  . Alcoholic cirrhosis of liver    Per patient - condition is not related to moderate alcohol use but rather prolonged NSAID use and recent abuse.  . Calculus of gallbladder without cholecystitis without obstruction   . Chronic pain    Neck/lower back  . Degenerative disc disease, lumbar   . Diabetic polyneuropathy   . Essential hypertension, benign   . Generalized anxiety disorder   . Glaucoma    Per patient report  . Headache   . 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 Major depressive disorder   . Other spondylosis with radiculopathy, cervical region   .  Retinopathy    Right eye; per patient report  . Type 2 diabetes mellitus with hyperglycemia      Family History  Problem Relation Age of Onset  . Aneurysm Father   . Multiple sclerosis Sister   . Diabetes Neg Hx      Social History   Socioeconomic History  . Marital status: Single    Spouse name: Not on file  . Number of children: Not on file  . Years of education: 48  . Highest education level: Master's degree (e.g., MA, MS, MEng, MEd, MSW, MBA)  Occupational History  . Not on file  Tobacco Use  . Smoking status: Current Every Day Smoker    Types: E-cigarettes  . Smokeless tobacco: Never Used  . Tobacco comment: Currently vapes   Vaping Use  . Vaping Use: Never used  Substance and Sexual Activity  . Alcohol use: Yes    Alcohol/week: 14.0 - 21.0 standard drinks    Types: 14 - 21 Cans of beer per week    Comment: 2-3 beers per night on average; sometimes up to 6  . Drug use: Yes    Types: Marijuana    Comment: 1/week to help sleep; edibles, does not smoke  . Sexual activity: Yes  Other Topics Concern  . Not on file  Social History Narrative  . Not on file   Social Determinants of Health   Financial Resource Strain:   . Difficulty of Paying Living Expenses: Not on file  Food Insecurity:   . Worried About Charity fundraiser in the Last Year: Not on file  . Ran Out of Food in the Last Year: Not on file  Transportation Needs:   . Lack of Transportation (Medical): Not on file  . Lack of Transportation (Non-Medical): Not on file  Physical Activity:   . Days of Exercise per Week: Not on file  . Minutes of Exercise per Session: Not on file  Stress:   . Feeling of Stress : Not on file  Social Connections:   . Frequency of Communication with Friends and Family: Not on file  . Frequency of Social Gatherings with Friends and Family: Not on file  . Attends Religious Services: Not on file  . Active Member of Clubs or Organizations: Not on file  . Attends Archivist Meetings: Not on file  . Marital Status: Not on file  Intimate Partner Violence:   . Fear of Current or Ex-Partner: Not on file  . Emotionally Abused: Not on file  . Physically Abused: Not on file  . Sexually Abused: Not on file     Allergies  Allergen Reactions  . Hydrocodone Itching    Itching, face tingling, dizziness.  [name] [Oxycodone Hcl] Itching     Outpatient Medications Prior to Visit  Medication Sig Dispense Refill  . Accu-Chek FastClix Lancets MISC Use as instructed to check blood sugar up to 3 times daily. (Patient taking differently: 1 each by Other route 2 (two) times daily. ) 102 each 11  . ALPHA-LIPOIC ACID  PO Take by mouth.    . Blood Glucose Monitoring Suppl (ACCU-CHEK GUIDE ME) w/Device KIT 1 kit by Does not apply route 3 (three) times daily. (Patient taking differently: 1 kit by Does not apply route 2 (two) times daily. ) 1 kit 0  . calcium-vitamin D (OSCAL WITH D) 500-200 MG-UNIT tablet Take 1 tablet by mouth.    . dapagliflozin propanediol (FARXIGA) 5 MG TABS tablet Take 1 tablet (  5 mg total) by mouth daily before breakfast. 90 tablet 3  . glucose blood (ACCU-CHEK GUIDE) test strip Use as instructed to check blood sugar up to 3 times daily. (Patient taking differently: 1 each by Other route 2 (two) times daily. ) 100 each 12  . lisinopril (ZESTRIL) 20 MG tablet Take 1 tablet (20 mg total) by mouth daily. 90 tablet 0  . metFORMIN (GLUCOPHAGE-XR) 500 MG 24 hr tablet Take 2 tablets (1,000 mg total) by mouth 2 (two) times daily with a meal. 120 tablet 2  . Multiple Vitamins-Minerals (MULTIVITAMIN WITH MINERALS) tablet Take 1 tablet by mouth daily.     . Nutritional Supplements (DHEA PO) Take 100 mg by mouth daily.    Marland Kitchen OVER THE COUNTER MEDICATION Black seed oil 2000per day    . TRUEPLUS PEN NEEDLES 32G X 4 MM MISC 1 each by Other route daily.     Marland Kitchen zinc gluconate 50 MG tablet Take 50 mg by mouth daily.    . DULoxetine HCl 30 MG CSDR Take 30 mg by mouth daily. 30 capsule 6  . insulin glargine (LANTUS SOLOSTAR) 100 UNIT/ML Solostar Pen Inject 18 Units into the skin every morning. And pen needles 1/day 15 mL 2  . methocarbamol (ROBAXIN) 500 MG tablet Take 2 tablets (1,000 mg total) by mouth every 6 (six) hours as needed for muscle spasms. 90 tablet 0  . topiramate (TOPAMAX) 50 MG tablet Take 1 tablet (50 mg total) by mouth 2 (two) times daily. 60 tablet 5   No facility-administered medications prior to visit.     Review of Systems  HENT: Negative.   Respiratory: Negative.   Cardiovascular: Negative.   Gastrointestinal: Negative.   Genitourinary: Negative.   Musculoskeletal: Positive for back  pain, gait problem, neck pain and neck stiffness.  Skin: Negative.   Hematological: Negative.   Psychiatric/Behavioral: Positive for decreased concentration, dysphoric mood and sleep disturbance.       Objective:   Physical Exam  Vitals:   05/23/20 0958  BP: 133/73  Pulse: (!) 102  Resp: 16  Temp: 98.5 F (36.9 C)  SpO2: 97%  Weight: 232 lb 12.8 oz (105.6 kg)    Gen: Pleasant, well-nourished, in no distress,  normal affect  ENT: No lesions,  mouth clear,  oropharynx clear, no postnasal drip  Neck: No JVD, no TMG, no carotid bruits  Lungs: No use of accessory muscles, no dullness to percussion, clear without rales or rhonchi  Cardiovascular: RRR, heart sounds normal, no murmur or gallops, no peripheral edema  Abdomen: soft and NT, no HSM,  BS normal  Musculoskeletal: No deformities, no cyanosis or clubbing  Neuro: alert, non focal  Skin: Warm, no lesions or rashes    Assessment & Plan:  I personally reviewed all images and lab data in the [facility] system as well as any outside material available during this office visit and agree with the  radiology impressions.   Frequent falls After surgery in the neck the patient would benefit from physical therapy  Foraminal stenosis of cervical region Per Dr. Ophelia Charter  Type 2 diabetes mellitus with hyperglycemia Per Dr. Everardo All of endocrinology awaiting for Jicha initiation  Essential hypertension, benign Good control   Korban was seen today for diabetes.  Diagnoses and all orders for this visit:  Uncontrolled type 2 diabetes mellitus with hyperglycemia (HCC) -     POCT glucose (manual entry)  Chronic pain disorder -     methocarbamol (ROBAXIN) 500 MG tablet; Take 2  tablets (1,000 mg total) by mouth every 6 (six) hours as needed for muscle spasms. -     [id number] 11+Oxyco+Alc+Crt-Bund  Cervical radiculopathy -     methocarbamol (ROBAXIN) 500 MG tablet; Take 2 tablets (1,000 mg total) by mouth every 6 (six) hours as needed for  muscle spasms. -     [id number] 11+Oxyco+Alc+Crt-Bund  Need for hepatitis C screening test -     HCV Ab w/Rflx to Verification  Colon cancer screening -     Fecal occult blood, imunochemical  Encounter for screening for HIV -     HIV Antibody (routine testing w rflx)  Frequent falls  Foraminal stenosis of cervical region  Essential hypertension, benign  Other orders -     insulin glargine (LANTUS SOLOSTAR) 100 UNIT/ML Solostar Pen; Inject 22 Units into the skin every morning. And pen needles 1/day -     DULoxetine (CYMBALTA) 60 MG capsule; Take 1 capsule (60 mg total) by mouth daily. -     topiramate (TOPAMAX) 50 MG tablet; Take 1 tablet (50 mg total) by mouth 2 (two) times daily. -     traMADol (ULTRAM) 50 MG tablet; Take 1 tablet (50 mg total) by mouth every 8 (eight) hours as needed for up to 5 days.  The patient declined flu vaccine at this time.  Note the patient also is yet to receive the Covid vaccine I spent 5 minutes explaining the importance of this he will give this consideration.  I did refill the Cymbalta and tramadol for his neck and back pain we will also check hepatitis C assay HIV and fecal occult to screen for colon cancer he will follow up with Dr. Chapman Olson

## 2020-05-23 NOTE — Assessment & Plan Note (Signed)
>>  ASSESSMENT AND PLAN FOR ESSENTIAL HYPERTENSION, BENIGN WRITTEN ON 05/23/2020  1:19 PM BY WRIGHT, Charlcie Cradle, MD  Good control

## 2020-05-24 LAB — HIV ANTIBODY (ROUTINE TESTING W REFLEX): HIV Screen 4th Generation wRfx: NONREACTIVE

## 2020-05-24 LAB — HCV INTERPRETATION

## 2020-05-24 LAB — HCV AB W/RFLX TO VERIFICATION: HCV Ab: <0.1 s/co ratio (ref 0.0–0.9)

## 2020-05-26 LAB — DRUG SCREEN 764883 11+OXYCO+ALC+CRT-BUND
Amphetamines, Urine: NEGATIVE ng/mL
Barbiturate: NEGATIVE ng/mL
Cannabinoid Quant, Ur: NEGATIVE ng/mL
Cocaine (Metabolite): NEGATIVE ng/mL
Creatinine: 219.8 mg/dL (ref 20.0–300.0)
Ethanol: NEGATIVE %
Meperidine: NEGATIVE ng/mL
Methadone Screen, Urine: NEGATIVE ng/mL
OPIATE SCREEN URINE: NEGATIVE ng/mL
Oxycodone/Oxymorphone, Urine: NEGATIVE ng/mL
Phencyclidine: NEGATIVE ng/mL
Propoxyphene: NEGATIVE ng/mL
pH, Urine: 5.7 (ref 4.5–8.9)

## 2020-05-26 LAB — BENZODIAZEPINES CONFIRM, URINE
Alprazolam: NEGATIVE
Benzodiazepines: POSITIVE ng/mL — AB
Clonazepam: NEGATIVE
Flurazepam: NEGATIVE
Lorazepam: NEGATIVE
Midazolam: NEGATIVE
Nordiazepam: NEGATIVE
Oxazepam Conf: 147 ng/mL
Oxazepam: POSITIVE — AB
Temazepam: NEGATIVE
Triazolam: NEGATIVE

## 2020-05-26 LAB — TRAMADOL GC/MS, URINE
Tramadol gc/ms Conf: 272 ng/mL
Tramadol: POSITIVE — AB

## 2020-05-27 ENCOUNTER — Telehealth: Payer: Self-pay

## 2020-05-27 NOTE — Telephone Encounter (Signed)
Contacted pt to go over lab results pt is aware pt states he is only taking Tramadol.

## 2020-05-29 ENCOUNTER — Ambulatory Visit
Admission: RE | Admit: 2020-05-29 | Discharge: 2020-05-29 | Disposition: A | Discharge status: Home / Self Care | Payer: Medicaid Other | Source: Ambulatory Visit | Attending: Adult Health | Admitting: Adult Health

## 2020-05-29 ENCOUNTER — Ambulatory Visit
Admission: RE | Admit: 2020-05-29 | Discharge: 2020-05-29 | Disposition: A | Discharge status: Home / Self Care | Payer: Medicaid Other | Source: Ambulatory Visit | Attending: Orthopaedic Surgery | Admitting: Orthopaedic Surgery

## 2020-05-29 ENCOUNTER — Other Ambulatory Visit: Payer: Self-pay

## 2020-05-29 DIAGNOSIS — M4802 Spinal stenosis, cervical region: Secondary | ICD-10-CM

## 2020-05-29 DIAGNOSIS — M4722 Other spondylosis with radiculopathy, cervical region: Secondary | ICD-10-CM | POA: Diagnosis not present

## 2020-05-29 DIAGNOSIS — R4189 Other symptoms and signs involving cognitive functions and awareness: Secondary | ICD-10-CM | POA: Diagnosis not present

## 2020-05-29 MED ORDER — GADOBENATE DIMEGLUMINE 529 MG/ML IV SOLN
20.0000 mL | Freq: Once | INTRAVENOUS | Status: AC | PRN
  Administered 2020-05-29: 20 mL via INTRAVENOUS

## 2020-05-31 ENCOUNTER — Ambulatory Visit (INDEPENDENT_AMBULATORY_CARE_PROVIDER_SITE_OTHER): Payer: Medicaid Other | Admitting: Orthopaedic Surgery

## 2020-05-31 ENCOUNTER — Encounter: Payer: Self-pay | Admitting: Orthopaedic Surgery

## 2020-05-31 ENCOUNTER — Telehealth: Payer: Self-pay | Admitting: *Deleted

## 2020-05-31 VITALS — Ht 73.0 in | Wt 233.0 lb

## 2020-05-31 DIAGNOSIS — M4802 Spinal stenosis, cervical region: Secondary | ICD-10-CM

## 2020-05-31 DIAGNOSIS — M4722 Other spondylosis with radiculopathy, cervical region: Secondary | ICD-10-CM

## 2020-05-31 NOTE — Progress Notes (Signed)
Office Visit Note   Patient: Gerald Olson           Date of Birth: 10/20/1965           MRN: [id number] Visit Date: 05/31/2020              Requested by: Antony Blackbird, MD [place],  [place] [address] PCP: Antony Blackbird, MD   Assessment & Plan: Visit Diagnoses:  1. Foraminal stenosis of cervical region   2. Spinal stenosis of cervical region   3. Other spondylosis with radiculopathy, cervical region     Plan: MRI scans were reviewed with patient.  Regional MRI 11/22/2019 showed some changes at C5-6 suggestive of myelomalacia.  He does have significant stenosis at C5-6 but new scan does not show definite myelomalacia.  Patient has foraminal stenosis and central stenosis and plan would be two-level cervical fusion C4-5, C5-6 with allograft and plate.  Overnight stay.  We discussed using a soft collar postoperatively.  Questions were elicited and answered patient states he like to proceed and has had persistent and increasing symptoms since he was first seen November 2019.  Follow-Up Instructions: Return in about 1 month (around 07/01/2020).   Orders:  No orders of the defined types were placed in this encounter.  No orders of the defined types were placed in this encounter.     Procedures: No procedures performed   Clinical Data: No additional findings.   Subjective: Chief Complaint  Patient presents with  . Neck - Follow-up    MRI Review Cervical spine    HPI 54 year old male returns with ongoing problems with chronic neck pain that radiates into both shoulders.  He has problems sleeping problems with numbness that radiates into his hands and pain when he turns his neck.  Pain radiates down the radial side of his hand thumb index and long finger.  Patient's MRI showed stenosis at C4-5 narrowing down to 7.6 mm with 5 foraminal narrowing.  C5-6 showed more severe deformity of the cord with central stenosis at 6.8 mm and by foraminal stenosis.  Patient states  when he rotates his neck at times he feels a sharp electrical sensation that shoots into her shoulders and hands and toward his belt at the midline.  Does have a history of diabetes depression anxiety history of alcohol abuse not not drinking currently.  A1c 05/04/2020 was 7.3.  Patient been treated with anti-inflammatories, tramadol, Celebrex, muscle relaxants Robaxin, Naprosyn, aspirin without relief.  He has been through exercise program and traction without improvement.  Patient states is not able to handle the persistent neck pain no radicular pain into his arms.    Review of Systems positive type 2 diabetes history of depression controlled with medication anxiety.  Previous alcohol abuse.  Cervical stenosis.   Objective: Vital Signs: Ht 6\' 1"  (1.854 m)   Wt 233 lb (105.7 kg)   BMI 30.74 kg/m   Physical Exam Constitutional:      Appearance: He is well-developed.  HENT:     Head: Normocephalic and atraumatic.  Eyes:     Pupils: Pupils are equal, round, and reactive to light.  Neck:     Thyroid: No thyromegaly.     Trachea: No tracheal deviation.  Cardiovascular:     Rate and Rhythm: Normal rate.  Pulmonary:     Effort: Pulmonary effort is normal.     Breath sounds: No wheezing.  Abdominal:     General: Bowel sounds are normal.  Palpations: Abdomen is soft.  Skin:    General: Skin is warm and dry.     Capillary Refill: Capillary refill takes less than 2 seconds.  Neurological:     Mental Status: He is alert and oriented to person, place, and time.  Psychiatric:        Behavior: Behavior normal.        Thought Content: Thought content normal.        Judgment: Judgment normal.     Ortho Exam patient has bilateral brachial plexus tenderness.  Biceps triceps is strong.  No shoulder impingement.  Positive Lhermitte.  No long track signs no lower extremity clonus.  Biceps triceps brachial radialis reflexes are trace and symmetrical.  Median nerve at the forearm and wrist is  normal right and left.  Ulnar nerve at the elbow is normal.  Specialty Comments:  No specialty comments available.  Imaging: Narrative & Impression  CLINICAL DATA:  Spinal stenosis. Myelomalacia. Neck pain radiating down the left side.  EXAM: MRI CERVICAL SPINE WITHOUT CONTRAST  TECHNIQUE: Multiplanar, multisequence MR imaging of the cervical spine was performed. No intravenous contrast was administered.  COMPARISON:  11/21/2019  FINDINGS: Alignment: Straightening of the normal cervical lordosis. One or 2 mm of degenerative anterolisthesis at C7-T1.  Vertebrae: No fracture or primary bone lesion.  Cord: No primary cord lesion.  See below regarding stenosis.  Posterior Fossa, vertebral arteries, paraspinal tissues: Negative  Disc levels:  Foramen magnum is widely patent. No significant finding at the C1-2 level.  C2-3: Endplate osteophytes and bulging of the disc. No compressive central canal stenosis. Mild foraminal narrowing on the right.  C3-4: Endplate osteophytes and bulging of the disc. AP diameter of the canal in the midline 9.3 mm. Bilateral foraminal encroachment right worse than left. Either C4 nerve could be affected.  C4-5: Endplate osteophytes and bulging of the disc. AP diameter of the canal in the midline 7.6 mm. Slight triangulation of the cord without abnormal cord signal. Bilateral bony foraminal stenosis could affect either C5 nerve.  C5-6: Spondylosis with endplate osteophytes and protruding disc material more prominent towards the left. AP diameter of the canal in the midline 6.8 mm. Effacement of the subarachnoid space and some cord deformity particularly on the left. No conclusive abnormal T2 signal within the cord. Bilateral foraminal stenosis could compress either or both C6 nerves.  C6-7: Endplate osteophytes and bulging of the disc. AP diameter of the canal in the midline 8.2 mm. No cord deformity. Bilateral foraminal  stenosis left worse than right. Either C7 nerve could be affected, particularly the left.  C7-T1: Bilateral facet arthropathy with 1 or 2 mm of anterolisthesis. No disc pathology. No compressive canal stenosis. Bilateral foraminal stenosis could affect either C8 nerve. Edematous change of the facet joints could certainly relate to regional pain.  IMPRESSION: C2-3: Mild foraminal narrowing on the right.  C3-4: Spondylosis with bilateral foraminal narrowing right worse than left. Either C4 nerve could be affected.  C4-5: Spondylosis with bilateral foraminal narrowing. Either C5 nerve could be affected. Canal narrowing with AP diameter of 7.6 mm. Slight deformity of the cord without abnormal cord signal.  C5-6: Spondylosis with bilateral foraminal narrowing. Findings more pronounced on the left. Either C6 nerve could be affected. Canal narrowing worse on the left with deformity of the cord. I do not see conclusive T2 signal within the cord. AP diameter of the canal in the midline only 6.8 mm. The patient could be at risk of developing myelopathy  at this level.  C6-7: Spondylosis with bilateral foraminal narrowing. Either C7 nerve could be affected. Somewhat worse on the left.  C7-T1: Facet arthropathy with 1 or 2 mm of anterolisthesis and edematous change. Bilateral foraminal stenosis could affect either C8 nerve. The facet arthropathy is likely painful.   Electronically Signed   By: Nelson Chimes M.D.   On: 05/31/2020 14:46      PMFS History: Patient Active Problem List   Diagnosis Date Noted  . Spinal stenosis of cervical region 06/03/2020  . Frequent falls 05/23/2020  . Foraminal stenosis of cervical region 05/09/2020  . Diabetic polyneuropathy   . ADHD (attention deficit hyperactivity disorder)   . Generalized anxiety disorder   . Major depressive disorder   . Other spondylosis with radiculopathy, cervical region 09/22/2019  . Low back pain 07/28/2019  .  Essential hypertension, benign 01/28/2019  . Alcoholic cirrhosis of liver 06/02/2018  . Type 2 diabetes mellitus with hyperglycemia 04/25/2018  . Alcohol use disorder, mild, abuse 04/25/2018   Past Medical History:  Diagnosis Date  . ADHD (attention deficit hyperactivity disorder)    Diagnosed during teenage years  . Alcohol use disorder    On average 14-21 drinks per week  . Alcoholic cirrhosis of liver    Per patient - condition is not related to moderate alcohol use but rather prolonged NSAID use and recent abuse.  . Calculus of gallbladder without cholecystitis without obstruction   . Chronic pain    Neck/lower back  . Degenerative disc disease, lumbar   . Diabetic polyneuropathy   . Essential hypertension, benign   . Generalized anxiety disorder   . Glaucoma    Per patient report  . Headache   . 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 Major depressive disorder   . Other spondylosis with radiculopathy, cervical region   . Retinopathy    Right eye; per patient report  . Type 2 diabetes mellitus with hyperglycemia     Family History  Problem Relation Age of Onset  . Aneurysm Father   . Multiple sclerosis Sister   . Diabetes Neg Hx     Past Surgical History:  Procedure Laterality Date  . CHOLECYSTECTOMY N/A 06/02/2018   Procedure: LAPAROSCOPIC CHOLECYSTECTOMY;  Surgeon: Vickie Epley, MD;  Location: [facility] ORS;  Service: General;  Laterality: N/A;  . KNEE ARTHROSCOPY     30 Years ago   Social History   Occupational History  . Not on file  Tobacco Use  . Smoking status: Current Every Day Smoker    Types: E-cigarettes  . Smokeless tobacco: Never Used  . Tobacco comment: Currently vapes  Vaping Use  . Vaping Use: Never used  Substance and Sexual Activity  . Alcohol use: Yes    Alcohol/week: 14.0 - 21.0 standard drinks    Types: 14 - 21 Cans of beer per week    Comment: 2-3 beers per night on average; sometimes  up to 6  . Drug use: Yes    Types: Marijuana    Comment: 1/week to help sleep; edibles, does not smoke  . Sexual activity: Yes

## 2020-05-31 NOTE — Telephone Encounter (Signed)
LMVM for pt to return call after 1300 for results of imaging study.

## 2020-05-31 NOTE — Telephone Encounter (Signed)
-----   Message from Frann Rider, NP sent at 05/31/2020 10:10 AM EDT ----- Please advise patient that his recent MRI did not show any recent or prior strokes that could be contributing to his cognitive changes.  There was noted decreased brain volume mildly less for expected age which is a normal process as you age and can be prevented/slowed by decreasing alcohol level, managing stressors, ensuring regular exercise, healthy diet and healthy sleep habits.

## 2020-06-01 ENCOUNTER — Telehealth: Payer: Self-pay | Admitting: *Deleted

## 2020-06-01 NOTE — Telephone Encounter (Signed)
-----   Message from Frann Rider, NP sent at 05/31/2020 10:10 AM EDT ----- Please advise patient that his recent MRI did not show any recent or prior strokes that could be contributing to his cognitive changes.  There was noted decreased brain volume mildly less for expected age which is a normal process as you age and can be prevented/slowed by decreasing alcohol level, managing stressors, ensuring regular exercise, healthy diet and healthy sleep habits.

## 2020-06-01 NOTE — Telephone Encounter (Signed)
I called pt and relayed MRI results to pt.  He verbalized understanding.

## 2020-06-03 DIAGNOSIS — M4802 Spinal stenosis, cervical region: Secondary | ICD-10-CM | POA: Insufficient documentation

## 2020-06-09 ENCOUNTER — Ambulatory Visit (INDEPENDENT_AMBULATORY_CARE_PROVIDER_SITE_OTHER): Payer: Medicaid Other | Admitting: Psychiatry

## 2020-06-09 ENCOUNTER — Other Ambulatory Visit (HOSPITAL_COMMUNITY): Payer: Self-pay | Admitting: Psychiatry

## 2020-06-09 ENCOUNTER — Other Ambulatory Visit: Payer: Self-pay

## 2020-06-09 ENCOUNTER — Encounter (HOSPITAL_COMMUNITY): Payer: Self-pay | Admitting: Psychiatry

## 2020-06-09 VITALS — BP 126/97 | HR 77 | Temp 99.2°F | Ht 74.0 in | Wt 235.0 lb

## 2020-06-09 DIAGNOSIS — F064 Anxiety disorder due to known physiological condition: Secondary | ICD-10-CM

## 2020-06-09 DIAGNOSIS — F0634 Mood disorder due to known physiological condition with mixed features: Secondary | ICD-10-CM | POA: Diagnosis not present

## 2020-06-09 DIAGNOSIS — F1020 Alcohol dependence, uncomplicated: Secondary | ICD-10-CM

## 2020-06-09 DIAGNOSIS — F3341 Major depressive disorder, recurrent, in partial remission: Secondary | ICD-10-CM | POA: Insufficient documentation

## 2020-06-09 MED ORDER — DULOXETINE HCL 60 MG PO CPEP: 60.0000 mg | ORAL_CAPSULE | Freq: Two times a day (BID) | ORAL | 1 refills | Status: DC
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
## 2020-06-13 DIAGNOSIS — Z1211 Encounter for screening for malignant neoplasm of colon: Secondary | ICD-10-CM | POA: Diagnosis not present

## 2020-06-13 MED FILL — metFORMIN HCL ER 500 MG TB2: 500 | 30 days supply | Qty: 120 | Fill #1

## 2020-06-14 ENCOUNTER — Other Ambulatory Visit: Payer: Self-pay

## 2020-06-14 ENCOUNTER — Ambulatory Visit: Payer: Medicaid Other | Admitting: Adult Health

## 2020-06-14 ENCOUNTER — Encounter: Payer: Self-pay | Admitting: Adult Health

## 2020-06-14 VITALS — BP 126/99 | HR 87 | Resp 16 | Ht 74.0 in | Wt 236.2 lb

## 2020-06-14 DIAGNOSIS — F1010 Alcohol abuse, uncomplicated: Secondary | ICD-10-CM | POA: Diagnosis not present

## 2020-06-14 DIAGNOSIS — F1020 Alcohol dependence, uncomplicated: Secondary | ICD-10-CM

## 2020-06-14 DIAGNOSIS — E1165 Type 2 diabetes mellitus with hyperglycemia: Secondary | ICD-10-CM | POA: Diagnosis not present

## 2020-06-14 DIAGNOSIS — Z1389 Encounter for screening for other disorder: Secondary | ICD-10-CM | POA: Insufficient documentation

## 2020-06-14 DIAGNOSIS — K7030 Alcoholic cirrhosis of liver without ascites: Secondary | ICD-10-CM | POA: Diagnosis not present

## 2020-06-14 DIAGNOSIS — F1211 Cannabis abuse, in remission: Secondary | ICD-10-CM | POA: Insufficient documentation

## 2020-06-14 DIAGNOSIS — M4802 Spinal stenosis, cervical region: Secondary | ICD-10-CM

## 2020-06-14 DIAGNOSIS — R161 Splenomegaly, not elsewhere classified: Secondary | ICD-10-CM

## 2020-06-14 DIAGNOSIS — R030 Elevated blood-pressure reading, without diagnosis of hypertension: Secondary | ICD-10-CM | POA: Insufficient documentation

## 2020-06-14 DIAGNOSIS — R590 Localized enlarged lymph nodes: Secondary | ICD-10-CM | POA: Insufficient documentation

## 2020-06-14 LAB — POCT URINALYSIS DIPSTICK
Glucose, UA: NEGATIVE
Leukocytes, UA: NEGATIVE
Nitrite, UA: NEGATIVE
Protein, UA: NEGATIVE
Spec Grav, UA: <=1.005 — AB (ref 1.010–1.025)
Urobilinogen, UA: 0.2 E.U./dL
pH, UA: 6 (ref 5.0–8.0)

## 2020-06-14 NOTE — Progress Notes (Signed)
If enough urine specimen send for urine micro and have him follow back up with Dr. Junious Silk  his urologist who follows him. He should schedule a follow up.

## 2020-06-14 NOTE — Progress Notes (Signed)
  New patient visit   Patient: Gerald Olson   DOB: 06/05/1966   54 y.o. Male  MRN: [id number] Visit Date: 06/14/2020  Today's healthcare provider: Michelle Smith Flinchum, FNP   Chief Complaint  Patient presents with  . New Patient (Initial Visit)   Subjective     HPI  Gerald Olson is a 54 y.o. male who presents today as a new patient to establish care.    he has cervical spondylosis and has had this evaluated and still seeing Dr. Mark Yates orthopedics.  Sees Dr. Sean Ellison, internal medicine for diabetes management. He has chronic pain.   Denies any suicidal/ homicidal intents or thoughts.   Uses Cannabis he reports TCA tincture once every  other week.  He does not smoke cannabis. He reports he is using medicinal.  2 beer a night. he reports can not eat without 2 beers a night once. Stops drinnking and takes apple cider vinegar.   Takes tramadol occasionally.   He is  Taking OxyContin  "getting from another source and pay for it" I only take occasionally. Of which he has on allergy list but reports he can take just makes him itch or feel like bugs landing on him.   Sees Dr. Kaur, [facility] for behavioral health, he had first visit with her last week. Dr. Kaur increased Cymbalta 60 mg BID. He reports he is tolerating well.   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 His back set him back he reports. He said he came up with cognitive treatment plan that seems  to be helping and staying busy and helping people.    Sees Dr. Matthew Eskridge for urlology microhematuria nd has had work up. Strange odor to urine. He advised him to be tested for lymphom a and management of mesenteric panniculitis. Patient reports he did not do this at that time.  CT abdomen/ pelvis w wo contrast  IMPRESSION: 1. A cause for hematuria is not identified. 2. Central mesenteric stranding with associated central mesenteric adenopathy. Possibilities include lymphoma or  sclerosing mesenteritis. 3. Hepatic sclerosis with mild splenomegaly. 4. Other imaging findings of potential clinical significance: Small paraesophageal uphill varices. 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 Moderate degenerative chondral thinning in the hips. Lower lumbar degenerative disc disease and degenerative disc disease causing foraminal impingement bilaterally at L5-S1 and to a lesser extent on the left at L4-5. Calcification along the vas deferens bilaterally, likely from diabetes. 5. Aortic atherosclerosis.  Aortic Atherosclerosis (ICD10-I70.0). Electronically Signed   By: Walter  Liebkemann M.D.   On: 03/02/2020 10:24   Patient  denies any fever,chills, rash, chest pain, shortness of breath, nausea, vomiting, or diarrhea.  Denies dizziness, lightheadedness, pre syncopal or syncopal episodes.    Past Medical History:  Diagnosis Date  . ADHD (attention deficit hyperactivity disorder)    Diagnosed during teenage years  . Alcohol use disorder    On average 14-21 drinks per week  . Alcoholic cirrhosis of liver    Per patient - condition is not related to moderate alcohol use but rather prolonged NSAID use and recent abuse.  . Calculus of gallbladder without cholecystitis without obstruction   . Chronic pain    Neck/lower back  . Degenerative disc disease, lumbar   . Diabetic polyneuropathy   . Essential hypertension, benign   . Generalized anxiety disorder   . Glaucoma    Per patient report  .   Headache   . 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 Major depressive disorder   . Other spondylosis with radiculopathy, cervical region   . Retinopathy    Right eye; per patient report  . Type 2 diabetes mellitus with hyperglycemia    Past Surgical History:  Procedure Laterality Date  . CHOLECYSTECTOMY N/A 06/02/2018   Procedure:  LAPAROSCOPIC CHOLECYSTECTOMY;  Surgeon: Vickie Epley, MD;  Location: [facility] ORS;  Service: General;  Laterality: N/A;  . KNEE ARTHROSCOPY     30 Years ago   Family Status  Relation Name Status  . Father  Deceased  . Mother  Alive  . Sister  (Not Specified)  . Neg Hx  (Not Specified)   Family History  Problem Relation Age of Onset  . Aneurysm Father   . Multiple sclerosis Sister   . Diabetes Neg Hx    Social History   Socioeconomic History  . Marital status: Single    Spouse name: Not on file  . Number of children: Not on file  . Years of education: 81  . Highest education level: Master's degree (e.g., MA, MS, MEng, MEd, MSW, MBA)  Occupational History  . Not on file  Tobacco Use  . Smoking status: Current Every Day Smoker    Types: E-cigarettes  . Smokeless tobacco: Never Used  . Tobacco comment: Currently vapes  Vaping Use  . Vaping Use: Never used  Substance and Sexual Activity  . Alcohol use: Yes    Alcohol/week: 14.0 - 21.0 standard drinks    Types: 14 - 21 Cans of beer per week    Comment: 2-3 beers per night on average; sometimes up to 6  . Drug use: Yes    Types: Marijuana    Comment: 1/week to help sleep; edibles, does not smoke  . Sexual activity: Yes  Other Topics Concern  . Not on file  Social History Narrative  . Not on file   Social Determinants of Health   Financial Resource Strain:   . Difficulty of Paying Living Expenses: Not on file  Food Insecurity:   . Worried About Charity fundraiser in the Last Year: Not on file  . Ran Out of Food in the Last Year: Not on file  Transportation Needs:   . Lack of Transportation (Medical): Not on file  . Lack of Transportation (Non-Medical): Not on file  Physical Activity:   . Days of Exercise per Week: Not on file  . Minutes of Exercise per Session: Not on file  Stress:   . Feeling of Stress : Not on file  Social Connections:   . Frequency of Communication with Friends and Family: Not on file  .  Frequency of Social Gatherings with Friends and Family: Not on file  . Attends Religious Services: Not on file  . Active Member of Clubs or Organizations: Not on file  . Attends Archivist Meetings: Not on file  . Marital Status: Not on file   Outpatient Medications Prior to Visit  Medication Sig  . Accu-Chek FastClix Lancets MISC Use as instructed to check blood sugar up to 3 times daily. (Patient taking differently: 1 each by Other route 2 (two) times daily. )  . ALPHA-LIPOIC ACID PO Take by mouth.  . Blood Glucose Monitoring Suppl (ACCU-CHEK GUIDE ME) w/Device KIT 1 kit by Does not apply route 3 (three) times daily. (Patient taking differently: 1 kit by Does not  apply route 2 (two) times daily. )  . calcium-vitamin D (OSCAL WITH D) 500-200 MG-UNIT tablet Take 1 tablet by mouth.  . dapagliflozin propanediol (FARXIGA) 5 MG TABS tablet Take 1 tablet (5 mg total) by mouth daily before breakfast.  . DULoxetine (CYMBALTA) 60 MG capsule Take 1 capsule (60 mg total) by mouth 2 (two) times daily.  Marland Kitchen glucose blood (ACCU-CHEK GUIDE) test strip Use as instructed to check blood sugar up to 3 times daily. (Patient taking differently: 1 each by Other route 2 (two) times daily. )  . insulin glargine (LANTUS SOLOSTAR) 100 UNIT/ML Solostar Pen Inject 22 Units into the skin every morning. And pen needles 1/day  . lisinopril (ZESTRIL) 20 MG tablet Take 1 tablet (20 mg total) by mouth daily.  . metFORMIN (GLUCOPHAGE-XR) 500 MG 24 hr tablet Take 2 tablets (1,000 mg total) by mouth 2 (two) times daily with a meal.  . methocarbamol (ROBAXIN) 500 MG tablet Take 2 tablets (1,000 mg total) by mouth every 6 (six) hours as needed for muscle spasms.  . Multiple Vitamins-Minerals (MULTIVITAMIN WITH MINERALS) tablet Take 1 tablet by mouth daily.   . Nutritional Supplements (DHEA PO) Take 100 mg by mouth daily.  Marland Kitchen OVER THE COUNTER MEDICATION Black seed oil 2000per day  . topiramate (TOPAMAX) 50 MG tablet Take 1  tablet (50 mg total) by mouth 2 (two) times daily.  . TRUEPLUS PEN NEEDLES 32G X 4 MM MISC 1 each by Other route daily.   Marland Kitchen zinc gluconate 50 MG tablet Take 50 mg by mouth daily.   No facility-administered medications prior to visit.   Allergies  Allergen Reactions  . Hydrocodone Itching    Itching, face tingling, dizziness.  [name] [Oxycodone Hcl] Itching    Immunization History  Administered Date(s) Administered  . Tdap 04/25/2018    Health Maintenance  Topic Date Due  . PNEUMOCOCCAL POLYSACCHARIDE VACCINE AGE 49-64 HIGH RISK  Never done  . OPHTHALMOLOGY EXAM  Never done  . COVID-19 Vaccine (1) Never done  . COLONOSCOPY  Never done  . INFLUENZA VACCINE  11/24/2020 (Originally 03/27/2020)  . HEMOGLOBIN A1C  11/01/2020  . FOOT EXAM  05/17/2021  . TETANUS/TDAP  04/25/2028  . Hepatitis C Screening  Completed  . HIV Screening  Completed    Patient Care Team: Jencarlo Bonadonna, Kelby Aline, FNP as PCP - General (Family Medicine)  Review of Systems  All other systems reviewed and are negative.   Last CBC Lab Results  Component Value Date   WBC 9.5 02/11/2020   HGB 15.2 02/11/2020   HCT 45.8 02/11/2020   MCV 86 02/11/2020   MCH 28.5 02/11/2020   RDW 14.2 02/11/2020   PLT 252 16/38/4665   Last metabolic panel Lab Results  Component Value Date   GLUCOSE 222 (H) 01/20/2020   NA 133 (L) 01/20/2020   K 4.7 01/20/2020   CL 98 01/20/2020   CO2 20 01/20/2020   BUN 13 01/20/2020   CREATININE 0.80 03/01/2020   GFRNONAA 97 01/20/2020   GFRAA 112 01/20/2020   CALCIUM 9.3 01/20/2020   PROT 6.8 10/08/2019   ALBUMIN 4.4 10/01/2019   LABGLOB 2.9 10/08/2019   AGRATIO 1.3 10/08/2019   BILITOT 0.5 10/01/2019   ALKPHOS 88 10/01/2019   AST 39 10/01/2019   ALT 52 (H) 10/01/2019   ANIONGAP 6 06/02/2018      Objective    BP (!) 126/99   Pulse 87 Comment: manual pulse  Resp 16   Ht 6' 2" (  1.88 m)   Wt 236 lb 3.2 oz (107.1 kg)   SpO2 99%   BMI 30.33 kg/m  Physical  Exam Constitutional:      General: He is not in acute distress.    Appearance: He is not ill-appearing, toxic-appearing or diaphoretic.     Comments: Patient is alert and oriented and responsive to questions Engages in eye contact with provider. Speaks in full sentences without any pauses without any shortness of breath or distress.    HENT:     Head: Normocephalic and atraumatic.     Right Ear: Tympanic membrane, ear canal and external ear normal. There is no impacted cerumen.     Left Ear: Tympanic membrane, ear canal and external ear normal. There is no impacted cerumen.     Nose: Nose normal. No congestion or rhinorrhea.     Mouth/Throat:     Mouth: Mucous membranes are moist.     Pharynx: No oropharyngeal exudate or posterior oropharyngeal erythema.  Eyes:     General: No scleral icterus.       Right eye: No discharge.        Left eye: No discharge.     Pupils: Pupils are equal, round, and reactive to light.  Neck:     Vascular: No carotid bruit.  Cardiovascular:     Rate and Rhythm: Normal rate and regular rhythm.     Pulses: Normal pulses.     Heart sounds: Normal heart sounds. No murmur heard.  No friction rub. No gallop.   Pulmonary:     Effort: Pulmonary effort is normal. No respiratory distress.     Breath sounds: Normal breath sounds. No stridor. No wheezing, rhonchi or rales.  Chest:     Chest wall: No tenderness.  Abdominal:     Palpations: Abdomen is soft.  Musculoskeletal:     Cervical back: Normal range of motion and neck supple. No rigidity or tenderness.  Lymphadenopathy:     Cervical: No cervical adenopathy.  Skin:    General: Skin is warm.     Capillary Refill: Capillary refill takes less than 2 seconds.     Findings: No rash.  Neurological:     Mental Status: He is alert and oriented to person, place, and time.     Motor: No weakness.     Gait: Gait normal.  Psychiatric:        Attention and Perception: Attention and perception normal.        Mood  and Affect: Mood normal.        Speech: Speech normal.        Behavior: Behavior is hyperactive. Behavior is not aggressive. Behavior is cooperative.        Thought Content: Thought content normal.        Cognition and Memory: Cognition and memory normal.        Judgment: Judgment normal.     Depression Screen PHQ 2/9 Scores 05/23/2020 07/01/2019 02/24/2019 11/28/2018  PHQ - 2 Score 6 0 3 0  PHQ- 9 Score 23 0 15 3   Results for orders placed or performed in visit on 06/14/20  POCT urinalysis dipstick  Result Value Ref Range   Color, UA yellow    Clarity, UA clear    Glucose, UA Negative Negative   Bilirubin, UA small    Ketones, UA trace    Spec Grav, UA <=1.005 (A) 1.010 - 1.025   Blood, UA non hemolyzed trace    pH, UA 6.0  5.0 - 8.0   Protein, UA Negative Negative   Urobilinogen, UA 0.2 0.2 or 1.0 E.U./dL   Nitrite, UA negative    Leukocytes, UA Negative Negative   Appearance     Odor      Assessment & Plan      Alcohol use disorder, mild, abuse - Plan: Ambulatory referral to Gastroenterology  Screening for blood or protein in urine - Plan: POCT urinalysis dipstick  Type 2 diabetes mellitus with hyperglycemia - Plan: CBC with Differential/Platelet, Comprehensive metabolic panel, Lipid panel, TSH, HgB A1c, PSA  Alcoholic cirrhosis of liver without ascites (HCC), Chronic - Plan: Ambulatory referral to Gastroenterology, Ambulatory referral to Hematology / Oncology  Splenomegaly- mild  - Plan: Ambulatory referral to Gastroenterology, Ambulatory referral to Hematology / Oncology  Foraminal stenosis of cervical region  Mesenteric lymphadenopathy - Plan: Ambulatory referral to Hematology / Oncology  Elevated blood pressure reading  History of cannabis abuse  Alcohol use disorder, moderate, dependence (HCC)  Orders Placed This Encounter  Procedures  . CBC with Differential/Platelet  . Comprehensive metabolic panel  . Lipid panel  . TSH  . HgB A1c  . PSA  .  Ambulatory referral to Gastroenterology    Referral Priority:   Routine    Referral Type:   Consultation    Referral Reason:   Specialty Services Required    Number of Visits Requested:   1  . Ambulatory referral to Hematology / Oncology    Referral Priority:   Routine    Referral Type:   Consultation    Referral Reason:   Specialty Services Required    Number of Visits Requested:   1  . POCT urinalysis dipstick   No orders of the defined types were placed in this encounter.    reports current drug use. Drug: Marijuana.   Orders Placed This Encounter  Procedures  . CBC with Differential/Platelet  . Comprehensive metabolic panel  . Lipid panel  . TSH  . HgB A1c  . PSA  . Ambulatory referral to Gastroenterology  . Ambulatory referral to Hematology / Oncology  . POCT urinalysis dipstick    recheck blood pressure, follow up on GI consult and heme oncology consult.  Labs discuss   Alcohol and cannabis cessation advised. Substance abuse help recommended. Keep scheduled specialty follow up's and call if not heard from referrals within 2 weeks.  Needs CPE.  Return in about 3 months (around 09/14/2020), or if symptoms worsen or fail to improve, for at any time for any worsening symptoms, Go to Emergency room/ urgent care if worse.    Addressed acute and or chronic medical problems today requiring 48 minutes reviewing patients medical record,labs, counseling patient regarding patient's conditions, any medications, answering questions regarding health, and coordination of care as needed. After visit summary patient given copy and reviewed.    Red Flags discussed. The patient was given clear instructions to go to ER or return to medical center if any red flags develop, symptoms do not improve, worsen or new problems develop. They verbalized understanding.  Michelle Smith Flinchum, FNP  [facility] [phone] (phone) [phone] (fax)  [facility] 

## 2020-06-14 NOTE — Addendum Note (Signed)
Addended by: Minette Headland on: 06/14/2020 04:35 PM   Modules accepted: Orders

## 2020-06-14 NOTE — Patient Instructions (Addendum)
Alcohol Use Education Information about Your Drinking Your score on the Alcohol Use Disorders Identification Test was: AUDIT C:    TOTAL AUDIT SCORE:   .  This score places you in the category of:  Score 0 = Abstainers Score 8-19 = Unhealthy/High Risk Drinkers  Score 1-7 = Low Risk Drinkers Score 20+ = Probable Alcohol Dependence   High Scores (20+) on the Alcohol Use Identification Test Consider becoming involved in a structured program.  You should stop drinking if: . You have tried to cut down before but have not been successful, or  . You suffer from morning shakes during a heavy drinking period, or . You have high blood pressure, or . You are pregnant, or . You have liver disease, or . You are taking medicines that react with alcohol, or . Your alcohol use is affecting your social relationships, or . You have legal consequences like DUIs, or . You call in sick to work, or . You cannot take care of our children, or . Someone close to you says you drink too much    How Much Alcohol is a Drink: Beer: 12 oz. = 1 drink 16 oz. = 1.3 drinks 22 oz. = 2 drinks 40 oz. = 3.3 drinks  Wine: 5 oz. = 1 drink 740 mL (25 oz.) bottle = 5 drinks Malt Liquor: 12 oz. = 1.5 drinks 16 oz. = 2 drinks 22 oz. = 2.5 drinks 40 oz. = 4.5 drinks  80-Proof Spirits - Hard Liquor: 1 shot = 1 drink 1 mixed drink = number of shots Can equal 1-3 drinks   What is Low-risk Drinking? . Have no more than 2 drinks of alcohol per day . Drink no more than 5 days per week . Do not drink alcohol drink alcohol when: - You drive or operate machinery - You are pregnant or breast feeding - You are taking medications that interact with alcohol - You have medical conditions made worse with alcohol - You can stop or control your drinking      Identify Your Triggers for Drinking . Parties . Particular People . Feeling lonely . Feeling tense . Family problems . Feeling sad . Feeling happy  . Feeling bored . After work . Problems sleeping . Criticism . Feelings of failure . After being paid . When others are drinking . In bars . When out for dinner . After arguments . Weekends . Feeling restless . Being in pain   Effects of High-Risk Drinking To the Brain: . Aggressive, irrational behavior . Arguments, violence . Depression, nervousness . Alcohol dependence, memory loss To the Nervous System: . Trembling hands, tingling fingers . Numbness, painful nerves . Impaired sensation leading to falls . Numb tingling toes To Your Lifestyle: . Social, legal, medical problems . Domestic trouble/relationship loss . Job loss & financial problems . Shortened life span . Accidents and death from drunk driving   To the Face: . Premature aging, drinker's nose . Cancer of the throat & mouth To the Body: . Frequent cold . Reduced resistance to infection . Increased risk of pneumonia . Weakness of heart muscle . Heart failure, anemia . Impaired blood clotting . Breast cancer . Vitamin deficiency, bleeding . Severe Inflammation of the stomach . Vomiting, diarrhea, malnutrition . Ulcer, inflammation of the pancreas . Impaired sexual performance . Birth defects, including deformities, retardation, and low birthweight   Ways to Cope Without Drinking . Go home if you tend to drink after work . Find another  activity . Switch to nonalcoholic beverages . Change friends . Join a club . Volunteer . Visit relatives . Plan/take a trip . Go for a walk . Take up a hobby . Listen to music . Talk to a friend . Reading . What would you do if you had no worries about failing?         Good Reasons for Drinking Less . I will live longer - probably 8-10 years. . I will sleep better. . I will be happier. . I will save a lot of money . My relationships will improve. . I will stay younger for longer. . I will achieve more in my life . There will be a greater chance that  I will survive to a healthy old age with no premature damage to my brain.  . I will be better at my job. . I will be less likely to feel depressed and commit suicide (6 times less likely). . I will be less likely to die of heart disease or cancer. . Other people will respect me . I will be less likely to get into trouble with the police. . The possibility that I will die of liver disease will be dramatically reduced (12 times less likely). . It will be less likely that I will die in a car accident (3 times less likely).   Strategies for Cutting Down Keep Track.  Find a way to keep track of how much you drink.  If you make a note of each drink before you drink it, this will help slow you down. Count and Measure.  Know the standard drink sizes.  Ask the bartender or server about the amount of alcohol in a mixed drink. Set Goals.  Decide how many days a week you will drink and how many drinks each day. Pace and Space.  When you do drink, pace yourself.  Have no more than one drink with alcohol per hour.  Alternate "drink spacers" non-alcoholic drinks such as water, soda, or juice with drinks containing alcohol. Include Food.  Don't drink on an empty stomach.  Have some food so the alcohol will be absorbed more slowly into your system.  Avoid Triggers.  Avoid people, places, or activities that have led to drinking in the past.  Certain times of day or feelings may also be triggers.  Make a plan so you will know what you can do instead of drinking. Plan to Handle Urges.  When an urge hits, consider these options:  Remind yourself of your reasons for changing.  Or talk it through with someone you trust. Or get involved with a healthy, distracting activity.  Or, "urge surf" - instead of fighting the feeling, accept it and ride it out, knowing it will soon crest like a wave and pass. Know Your "No".  Have a polite, convincing "no thanks" for those times when you may be offered a drink and don't want one.   The faster you can say no to these offers, the less likely you are to give in.  If you hesitate, it allows you time to think of excuses to go along.        What You Need to Know About Marijuana Use Marijuana is a mixture of the dried leaves and flowers of the hemp plant Cannabis sativa. The plant's active ingredients (cannabinoids) change the chemistry of the brain. If you smoke or eat marijuana, you will experience changes in the way you think, feel, and behave. Many people  use marijuana because it helps them relax and puts them in a pleasurable mood (marijuana high). Some people use marijuana for medical effects, such as:  Reduced nausea.  Increased appetite.  Reduced muscle spasm.  Pain relief. Researchers are studying other possible medical uses for marijuana. How can marijuana use affect me? Many people find a marijuana high to be pleasurable and relaxing. Other people find a marijuana high to be uncomfortable or anxiety-causing. This drug can cause short-term and long-term physical and mental effects. Taking high doses of marijuana or trying to quit marijuana can also affect you. Short-term effects of marijuana use include:  Temporary relief of symptoms from a medical condition.  Changes in mood and perception (feeling high).  Increased hunger.  Increased heart rate.  Slowed movement and reaction time.  Poor memory, judgment, and problem solving ability.  Altered sense of time.  Changes to vision.  Bloodshot eyes.  Coughing. Long-term effects of marijuana use include:  Higher risk of lung and breathing problems.  Higher risk of heart attack.  Higher risk of testicular cancer.  Mental and physical dependence (addiction).  Slowed brain development in young people. Babies whose mothers used marijuana during pregnancy have an increased risk of problems with brain development and behavior.  Temporary periods of false perceptions or beliefs (hallucinations or  paranoia).  Worsening of mental illness.  Onset of new mental illness such as anxiety, depression, or suicidal thoughts.  Withdrawal symptoms when stopping marijuana, such as sleeplessness, anxiety, cravings, and anger.  Difficulty maintaining healthy relationships.  Poor memory, and difficulty concentrating and learning. This can result in decreased intelligence and poor performance at school or work, and an increased risk of dropping out of school.  Higher risk of using other substances like alcohol and nicotine. High doses of marijuana can cause:  Panic.  Anxiety.  Mental confusion.  Hallucinations. Quitting marijuana after using it for a long time can cause withdrawal symptoms, such as:  Headache.  Shakiness.  Sweating.  Stomach pain.  Nausea.  Restlessness.  Irritability.  Trouble sleeping.  Decreased appetite. What are the benefits of not using marijuana? Not using marijuana can keep you from becoming dependent on it. You can avoid the negative effects of the drug that can reduce your quality of life. You can avoid accidents caused by the slowed reaction time that is common with marijuana use. If I already use marijuana, what steps can I take to stop using it? If you are not physically or mentally dependent on marijuana, you should be able to stop using it on your own. If you cannot stop on your own, ask your health care provider for help. Treatment for marijuana addiction is similar to treatment for other addictions. It may include:  Cognitive-behavioral therapy (psychotherapy). This may include individual or group therapy.  Joining a support group.  Treating medical, behavioral, or mental health conditions that exist along with marijuana dependency.  Where can I get more information? Learn more about:  Marijuana from the [place] on Drug Abuse: MissingBag.si  Medical marijuana from the  [place]: LocatorExpress.is  Treatment options from the Substance Abuse and [place]: https://findtreatment.[employer].com.au  Recovery from marijuana dependency from Recovery.org: http://www.recovery.org/topics/marijuana-recovery When should I seek medical care? Talk with your health care provider if:  You want to stop using marijuana but you cannot.  You have withdrawal symptoms when you try to stop using marijuana.  You are using marijuana every day.  You are using marijuana along  with other drugs like cocaine or alcohol.  You have anxiety or depression.  You have hallucinations or paranoia.  Marijuana use is interfering with your relationships or your ability to function normally at school or at work. Summary  You may become physically or mentally dependent on marijuana.  Long-term use may interfere with your ability to function normally at home, school, or work.  Marijuana addiction is treatable. This information is not intended to replace advice given to you by your health care provider. Make sure you discuss any questions you have with your health care provider. Document Revised: 07/26/2017 Document Reviewed: 05/02/2016 Elsevier Patient Education  2020 [place] Maintenance, Male Adopting a healthy lifestyle and getting preventive care are important in promoting health and wellness. Ask your health care provider about:  The right schedule for you to have regular tests and exams.  Things you can do on your own to prevent diseases and keep yourself healthy. What should I know about diet, weight, and exercise? Eat a healthy diet   Eat a diet that includes plenty of vegetables, fruits, low-fat dairy products, and lean protein.  Do not eat a lot of foods that are high in solid fats, added sugars, or sodium. Maintain a healthy weight Body mass index (BMI) is a measurement that can be used to  identify possible weight problems. It estimates body fat based on height and weight. Your health care provider can help determine your BMI and help you achieve or maintain a healthy weight. Get regular exercise Get regular exercise. This is one of the most important things you can do for your health. Most adults should:  Exercise for at least 150 minutes each week. The exercise should increase your heart rate and make you sweat (moderate-intensity exercise).  Do strengthening exercises at least twice a week. This is in addition to the moderate-intensity exercise.  Spend less time sitting. Even light physical activity can be beneficial. Watch cholesterol and blood lipids Have your blood tested for lipids and cholesterol at 54 years of age, then have this test every 5 years. You may need to have your cholesterol levels checked more often if:  Your lipid or cholesterol levels are high.  You are older than 54 years of age.  You are at high risk for heart disease. What should I know about cancer screening? Many types of cancers can be detected early and may often be prevented. Depending on your health history and family history, you may need to have cancer screening at various ages. This may include screening for:  Colorectal cancer.  Prostate cancer.  Skin cancer.  Lung cancer. What should I know about heart disease, diabetes, and high blood pressure? Blood pressure and heart disease  High blood pressure causes heart disease and increases the risk of stroke. This is more likely to develop in people who have high blood pressure readings, are of African descent, or are overweight.  Talk with your health care provider about your target blood pressure readings.  Have your blood pressure checked: ? Every 3-5 years if you are 1-53 years of age. ? Every year if you are 34 years old or older.  If you are between the ages of 13 and 27 and are a current or former smoker, ask your health  care provider if you should have a one-time screening for abdominal aortic aneurysm (AAA). Diabetes Have regular diabetes screenings. This checks your fasting blood sugar level. Have the screening done:  Once every three years  after age 57 if you are at a normal weight and have a low risk for diabetes.  More often and at a younger age if you are overweight or have a high risk for diabetes. What should I know about preventing infection? Hepatitis B If you have a higher risk for hepatitis B, you should be screened for this virus. Talk with your health care provider to find out if you are at risk for hepatitis B infection. Hepatitis C Blood testing is recommended for:  Everyone born from [birth year] through 1965.  Anyone with known risk factors for hepatitis C. Sexually transmitted infections (STIs)  You should be screened each year for STIs, including gonorrhea and chlamydia, if: ? You are sexually active and are younger than 54 years of age. ? You are older than 54 years of age and your health care provider tells you that you are at risk for this type of infection. ? Your sexual activity has changed since you were last screened, and you are at increased risk for chlamydia or gonorrhea. Ask your health care provider if you are at risk.  Ask your health care provider about whether you are at high risk for HIV. Your health care provider may recommend a prescription medicine to help prevent HIV infection. If you choose to take medicine to prevent HIV, you should first get tested for HIV. You should then be tested every 3 months for as long as you are taking the medicine. Follow these instructions at home: Lifestyle  Do not use any products that contain nicotine or tobacco, such as cigarettes, e-cigarettes, and chewing tobacco. If you need help quitting, ask your health care provider.  Do not use street drugs.  Do not share needles.  Ask your health care provider for help if you need support or  information about quitting drugs. Alcohol use  Do not drink alcohol if your health care provider tells you not to drink.  If you drink alcohol: ? Limit how much you have to 0-2 drinks a day. ? Be aware of how much alcohol is in your drink. In the [place] one drink equals one 12 oz bottle of beer (355 mL), one 5 oz glass of wine (148 mL), or one 1 oz glass of hard liquor (44 mL). General instructions  Schedule regular health, dental, and eye exams.  Stay current with your vaccines.  Tell your health care provider if: ? You often feel depressed. ? You have ever been abused or do not feel safe at home. Summary  Adopting a healthy lifestyle and getting preventive care are important in promoting health and wellness.  Follow your health care provider's instructions about healthy diet, exercising, and getting tested or screened for diseases.  Follow your health care provider's instructions on monitoring your cholesterol and blood pressure. This information is not intended to replace advice given to you by your health care provider. Make sure you discuss any questions you have with your health care provider. Document Revised: 08/06/2018 Document Reviewed: 08/06/2018 Elsevier Patient Education  [place]. Substance Use Disorder Substance use disorder occurs when a person's repeated use of drugs or alcohol interferes with his or her ability to be productive. This disorder can cause problems with mental and physical health. It can affect your ability to have healthy relationships, and it can keep you from being able to meet your responsibilities at work, home, or school. It can also lead to addiction, which is a condition in which the person cannot stop  using the substance consistently for a period of time. Addiction changes the way the brain works. Because of these changes, addiction is a chronic condition. Substance use disorder can be mild, moderate, or severe. The most commonly abused  substances include:  Alcohol.  Tobacco.  Marijuana.  Stimulants, such as cocaine and methamphetamine.  Hallucinogens, such as LSD and PCP.  Opioids, such as some prescription pain medicines and heroin. What are the causes? This condition may develop due to many complex social, psychological, or physical reasons, such as:  Stress.  Abuse.  Peer pressure.  Anxiety or depression. What increases the risk? This condition is more likely to develop in people who:  Use substances to cope with stress.  Have been abused.  Have a mental health disorder, such as depression.  Have a family history of substance use disorder. What are the signs or symptoms? Symptoms of this condition include:  Using the substance for longer periods of time or at a higher dosage than what is normal or intended.  Having a lasting desire to use the substance.  Being unable to slow down or stop the use of the substance.  Spending an abnormal amount of time getting the substance, using the substance, or recovering from using the substance.  Using the substance in a way that interferes with work, school, social activities, and personal relationships.  Using the substance even after having negative consequences, such as: ? Health problems. ? Legal or financial troubles. ? Job loss. ? Relationship problems.  Needing more and more of the substance to get the same effect (developing tolerance).  Experiencing unpleasant symptoms if you do not use the substance (withdrawal).  Using the substance to avoid withdrawal symptoms. How is this diagnosed? This condition may be diagnosed based on:  A physical exam.  Your history of substance use.  Your symptoms. This includes: ? How substance use affects your life. ? Changes in personality, behaviors, and mood. ? Having at least two symptoms of substance use disorder within a 5-month period. ? Health issues related to substance use, such as liver  damage, shortness of breath, fatigue, cough, or heart problems.  Blood or urine tests to screen for alcohol and drugs. How is this treated? This condition may be treated by:  Stopping substance use safely. This may require taking medicines and being closely monitored for several days.  Taking part in group and individual counseling from mental health providers who help people with substance use disorder.  Staying at a live-in (residential) treatment center for several days or weeks.  Attending daily counseling sessions at a treatment center.  Taking medicine as told by your health care provider: ? To ease symptoms and prevent complications during withdrawal. ? To treat other mental health issues, such as depression or anxiety. ? To block cravings by causing the same effects as the substance. ? To block the effects of the substance or replace good sensations with unpleasant ones.  Participating in a support group to share your experience with others who are going through the same thing. These groups are an important part of long-term recovery for many people. Recovery can be a long process. Many people who undergo treatment start using the substance again after stopping (relapse). If you relapse, that does not mean that treatment will not work. Follow these instructions at home:   Take over-the-counter and prescription medicines only as told by your health care provider.  Do not use any drugs or alcohol.  Avoid temptations or triggers that  you associate with your use of the substance.  Learn and practice techniques for managing stress.  Have a plan for vulnerable moments. Get phone numbers of people who are willing to help and who are committed to your recovery.  Attend support groups on a regular basis. These groups include 12-step programs like Alcoholics Anonymous and Narcotics Anonymous.  Keep all follow-up visits as told by your health care providers. This is important. This  includes continuing to work with therapists and support groups. Contact a health care provider if:  You cannot take your medicines as told.  Your symptoms get worse.  You have trouble resisting the urge to use drugs or alcohol. Get help right away if you:  Relapse.  Think that you may have taken too much of a drug. The hotline of the [facility] is 435-122-9275.  Have signs of an overdose. Symptoms include: ? Chest pain. ? Confusion. ? Sleepiness or difficulty staying awake. ? Slowed breathing. ? Nausea or vomiting. ? A seizure.  Have serious thoughts about hurting yourself or someone else. Drug overdose is an emergency. Do not wait to see if the symptoms will go away. Get medical help right away. Call your local emergency services (911 in the [place] Do not drive yourself to the hospital. If you ever feel like you may hurt yourself or others, or have thoughts about taking your own life, get help right away. You can go to your nearest emergency department or call:  Your local emergency services (911 in the [place]  A suicide crisis helpline, such as the [place] at 4072533223. This is open 24 hours a day. Summary  Substance use disorder occurs when a person's repeated use of drugs or alcohol interferes with his or her ability to be productive.  Taking part in group and individual counseling from mental health providers is a common treatment for people with substance use disorder.  Recovery can be a long process. Many people who undergo treatment start using the substance again after stopping (relapse). A relapse does not mean that treatment will not work.  Attend support groups such as Alcoholics Anonymous and Narcotics Anonymous. These groups are an important part of long-term recovery for many people. This information is not intended to replace advice given to you by your health care provider. Make sure you discuss any  questions you have with your health care provider. Document Revised: 12/04/2018 Document Reviewed: 09/24/2017 Elsevier Patient Education  2020 [employer].

## 2020-06-15 ENCOUNTER — Encounter: Payer: Self-pay | Admitting: Adult Health

## 2020-06-15 ENCOUNTER — Other Ambulatory Visit: Payer: Self-pay | Admitting: Critical Care Medicine

## 2020-06-15 DIAGNOSIS — R195 Other fecal abnormalities: Secondary | ICD-10-CM | POA: Insufficient documentation

## 2020-06-15 DIAGNOSIS — Z1211 Encounter for screening for malignant neoplasm of colon: Secondary | ICD-10-CM

## 2020-06-15 LAB — COMPREHENSIVE METABOLIC PANEL
ALT: 38 IU/L (ref 0–44)
AST: 47 IU/L — ABNORMAL HIGH (ref 0–40)
Albumin/Globulin Ratio: 1.7 (ref 1.2–2.2)
Albumin: 4.3 g/dL (ref 3.8–4.9)
Alkaline Phosphatase: 105 IU/L (ref 44–121)
BUN/Creatinine Ratio: 13 (ref 9–20)
BUN: 13 mg/dL (ref 6–24)
Bilirubin Total: 0.7 mg/dL (ref 0.0–1.2)
CO2: 21 mmol/L (ref 20–29)
Calcium: 9.2 mg/dL (ref 8.7–10.2)
Chloride: 98 mmol/L (ref 96–106)
Creatinine, Ser: 0.99 mg/dL (ref 0.76–1.27)
GFR calc Af Amer: 100 mL/min/{1.73_m2} (ref >59)
GFR calc non Af Amer: 87 mL/min/{1.73_m2} (ref >59)
Globulin, Total: 2.5 g/dL (ref 1.5–4.5)
Glucose: 114 mg/dL — ABNORMAL HIGH (ref 65–99)
Potassium: 4.1 mmol/L (ref 3.5–5.2)
Sodium: 134 mmol/L (ref 134–144)
Total Protein: 6.8 g/dL (ref 6.0–8.5)

## 2020-06-15 LAB — CBC WITH DIFFERENTIAL/PLATELET
Basophils Absolute: 0.1 10*3/uL (ref 0.0–0.2)
Basos: 1 %
EOS (ABSOLUTE): 0.1 10*3/uL (ref 0.0–0.4)
Eos: 1 %
Hematocrit: 44.7 % (ref 37.5–51.0)
Hemoglobin: 14.5 g/dL (ref 13.0–17.7)
Immature Grans (Abs): 0 10*3/uL (ref 0.0–0.1)
Immature Granulocytes: 0 %
Lymphocytes Absolute: 2.2 10*3/uL (ref 0.7–3.1)
Lymphs: 20 %
MCH: 28.3 pg (ref 26.6–33.0)
MCHC: 32.4 g/dL (ref 31.5–35.7)
MCV: 87 fL (ref 79–97)
Monocytes Absolute: 0.8 10*3/uL (ref 0.1–0.9)
Monocytes: 8 %
Neutrophils Absolute: 7.8 10*3/uL — ABNORMAL HIGH (ref 1.4–7.0)
Neutrophils: 70 %
Platelets: 323 10*3/uL (ref 150–450)
RBC: 5.13 x10E6/uL (ref 4.14–5.80)
RDW: 13.5 % (ref 11.6–15.4)
WBC: 11 10*3/uL — ABNORMAL HIGH (ref 3.4–10.8)

## 2020-06-15 LAB — LIPID PANEL
Chol/HDL Ratio: 3.6 ratio (ref 0.0–5.0)
Cholesterol, Total: 122 mg/dL (ref 100–199)
HDL: 34 mg/dL — ABNORMAL LOW (ref >39)
LDL Chol Calc (NIH): 72 mg/dL (ref 0–99)
Triglycerides: 77 mg/dL (ref 0–149)
VLDL Cholesterol Cal: 16 mg/dL (ref 5–40)

## 2020-06-15 LAB — HEMOGLOBIN A1C
Est. average glucose Bld gHb Est-mCnc: 166 mg/dL
Hgb A1c MFr Bld: 7.4 % — ABNORMAL HIGH (ref 4.8–5.6)

## 2020-06-15 LAB — URINALYSIS, MICROSCOPIC ONLY
Bacteria, UA: NONE SEEN
Casts: NONE SEEN /lpf
Epithelial Cells (non renal): NONE SEEN /hpf (ref 0–10)
RBC, Urine: NONE SEEN /hpf (ref 0–2)
WBC, UA: NONE SEEN /hpf (ref 0–5)

## 2020-06-15 LAB — TSH: TSH: 2.96 u[IU]/mL (ref 0.450–4.500)

## 2020-06-15 LAB — FECAL OCCULT BLOOD, IMMUNOCHEMICAL: Fecal Occult Bld: POSITIVE — AB

## 2020-06-15 LAB — PSA: Prostate Specific Ag, Serum: 0.4 ng/mL (ref 0.0–4.0)

## 2020-06-15 MED FILL — DULoxetine HCL 60 MG CPEP: 60 | 30 days supply | Qty: 60 | Fill #0

## 2020-06-15 NOTE — Progress Notes (Signed)
Mild elevation of WBC and neutrophils.  He has referral to hematology placed already for mesentery lymph node swelling and he should hear within 2 week.   PSA within normal limits. TSH for thyroid within normal limits.  Hemoglobin A1C is 7.4 improved from 11 months ago at 9.1, glucose was 114, keep follow up with Dr. Loanne [name] his endocrinologist is advised.   Cholesterol is within normal limits except HDL good cholesterol is slightly decreased can improve with healthy diet and lifestyle.   Kidney function within normal limits.  AST is mildly elevated, avoid alcohol and tylenol is advised. He has history of cirrhosis and will be following with gastroenterology and should here from referral in 2 weeks.   Follow up as recommended and as needed.

## 2020-06-24 ENCOUNTER — Encounter: Payer: Self-pay | Admitting: Oncology

## 2020-06-25 NOTE — Progress Notes (Signed)
[place]  Telephone:(336) [phone] Fax:(336) [phone]  ID: Gerald Olson OB: [birth year]  MR#: [id number]  GUR#:[id number]  Patient Care Team: Doreen Beam, FNP as PCP - General (Family Medicine)  CHIEF COMPLAINT: Splenomegaly, mesenteric lymphadenopathy.  INTERVAL HISTORY: Patient is a 54 year old male who underwent a CT scan in July 2021 for hematuria and was noted to have mild splenomegaly as well as mesenteric lymphadenopathy. He is referred for further evaluation. Currently, he complains of increased weakness and fatigue. He also complains of worsening "cognitive issues". He otherwise feels well. He has no neurologic complaints. He denies any recent fevers or illnesses. He has a good appetite and denies weight loss. He has no chest pain, shortness of breath, cough, or hemoptysis. He denies any nausea, vomiting, constipation, or diarrhea. He has no melena or hematochezia. He has no urinary complaints. Patient offers no further specific complaints today.  REVIEW OF SYSTEMS:   Review of Systems  Constitutional: Negative.  Negative for fever and weight loss.  Respiratory: Negative.  Negative for cough, hemoptysis and shortness of breath.   Cardiovascular: Negative.  Negative for chest pain and leg swelling.  Gastrointestinal: Negative.  Negative for abdominal pain and blood in stool.  Genitourinary: Negative.  Negative for dysuria, flank pain and hematuria.  Musculoskeletal: Negative.  Negative for back pain.  Skin: Negative.  Negative for rash.  Neurological: Negative.  Negative for dizziness, focal weakness, weakness and headaches.  Psychiatric/Behavioral: Negative.  The patient is not nervous/anxious.     As per HPI. Otherwise, a complete review of systems is negative.  PAST MEDICAL HISTORY: Past Medical History:  Diagnosis Date   ADHD (attention deficit hyperactivity disorder)    Diagnosed during teenage years   Alcohol use disorder    On average  14-21 drinks per week   Alcoholic cirrhosis of liver    Per patient - condition is not related to moderate alcohol use but rather prolonged NSAID use and recent abuse.   Allergy    Calculus of gallbladder without cholecystitis without obstruction    Chronic pain    Neck/lower back   Degenerative disc disease, lumbar    Diabetic polyneuropathy    Essential hypertension, benign    Generalized anxiety disorder    Glaucoma    Per patient report   Headache    History of concussion 2006   IED exposure while serving as [place] in [place]   Major depressive disorder    Other spondylosis with radiculopathy, cervical region    Retinopathy    Right eye; per patient report   Type 2 diabetes mellitus with hyperglycemia     PAST SURGICAL HISTORY: Past Surgical History:  Procedure Laterality Date   CHOLECYSTECTOMY N/A 06/02/2018   Procedure: LAPAROSCOPIC CHOLECYSTECTOMY;  Surgeon: Vickie Epley, MD;  Location: [facility] ORS;  Service: General;  Laterality: N/A;   KNEE ARTHROSCOPY     46 Years ago    FAMILY HISTORY: Family History  Problem Relation Age of Onset   Aneurysm Father    Multiple sclerosis Sister    Emphysema Maternal Grandfather    Diabetes Neg Hx     ADVANCED DIRECTIVES (Y/N):  N  HEALTH MAINTENANCE: Social History   Tobacco Use   Smoking status: Former Smoker    Quit date: 06/25/2003    Years since quitting: 17.0   Smokeless tobacco: Never Used   Tobacco comment: Currently vapes  Vaping Use   Vaping Use: Every day  Substance Use Topics   Alcohol  use: Yes    Alcohol/week: 14.0 - 21.0 standard drinks    Types: 14 - 21 Cans of beer per week    Comment: 2-3 beers per night on average; sometimes up to 6   Drug use: Yes    Types: Marijuana    Comment: 1/week to help sleep; edibles, does not smoke     Colonoscopy:  PAP:  Bone density:  Lipid panel:  Allergies  Allergen Reactions   Hydrocodone Itching    Itching, face  tingling, dizziness.   Oxycontin [Oxycodone Hcl] Itching    Current Outpatient Medications  Medication Sig Dispense Refill   Accu-Chek FastClix Lancets MISC Use as instructed to check blood sugar up to 3 times daily. (Patient taking differently: 1 each by Other route 2 (two) times daily. ) 102 each 11   ALPHA-LIPOIC ACID PO Take by mouth.     Blood Glucose Monitoring Suppl (ACCU-CHEK GUIDE ME) w/Device KIT 1 kit by Does not apply route 3 (three) times daily. (Patient taking differently: 1 kit by Does not apply route 2 (two) times daily. ) 1 kit 0   calcium-vitamin D (OSCAL WITH D) 500-200 MG-UNIT tablet Take 1 tablet by mouth.     dapagliflozin propanediol (FARXIGA) 5 MG TABS tablet Take 1 tablet (5 mg total) by mouth daily before breakfast. 90 tablet 3   DULoxetine (CYMBALTA) 60 MG capsule Take 1 capsule (60 mg total) by mouth 2 (two) times daily. 60 capsule 1   glucose blood (ACCU-CHEK GUIDE) test strip Use as instructed to check blood sugar up to 3 times daily. (Patient taking differently: 1 each by Other route 2 (two) times daily. ) 100 each 12   insulin glargine (LANTUS SOLOSTAR) 100 UNIT/ML Solostar Pen Inject 22 Units into the skin every morning. And pen needles 1/day 15 mL 2   lisinopril (ZESTRIL) 20 MG tablet Take 1 tablet (20 mg total) by mouth daily. 90 tablet 0   metFORMIN (GLUCOPHAGE-XR) 500 MG 24 hr tablet Take 2 tablets (1,000 mg total) by mouth 2 (two) times daily with a meal. 120 tablet 2   methocarbamol (ROBAXIN) 500 MG tablet Take 2 tablets (1,000 mg total) by mouth every 6 (six) hours as needed for muscle spasms. 90 tablet 0   Multiple Vitamins-Minerals (MULTIVITAMIN WITH MINERALS) tablet Take 1 tablet by mouth daily.      Nutritional Supplements (DHEA PO) Take 100 mg by mouth daily.     OVER THE COUNTER MEDICATION Black seed oil 2000per day     topiramate (TOPAMAX) 50 MG tablet Take 1 tablet (50 mg total) by mouth 2 (two) times daily. 60 tablet 5   TRUEPLUS  PEN NEEDLES 32G X 4 MM MISC 1 each by Other route daily.      zinc gluconate 50 MG tablet Take 50 mg by mouth daily.     No current facility-administered medications for this visit.    OBJECTIVE: Vitals:   06/27/20 1507  BP: (!) 171/100  Pulse: (!) 106  Resp: 20  Temp: 98.4 F (36.9 C)  SpO2: 98%     Body mass index is 30.34 kg/m.    ECOG FS:0 - Asymptomatic  General: Well-developed, well-nourished, no acute distress. Eyes: Pink conjunctiva, anicteric sclera. HEENT: Normocephalic, moist mucous membranes. Lungs: No audible wheezing or coughing. Heart: Regular rate and rhythm. Abdomen: Soft, nontender, no obvious distention. Musculoskeletal: No edema, cyanosis, or clubbing. Neuro: Alert, answering all questions appropriately. Cranial nerves grossly intact. Skin: No rashes or petechiae noted. Psych: Normal  affect. Lymphatics: No cervical, calvicular, axillary or inguinal LAD.   LAB RESULTS:  Lab Results  Component Value Date   NA 134 06/14/2020   K 4.1 06/14/2020   CL 98 06/14/2020   CO2 21 06/14/2020   GLUCOSE 114 (H) 06/14/2020   BUN 13 06/14/2020   CREATININE 0.99 06/14/2020   CALCIUM 9.2 06/14/2020   PROT 6.8 06/14/2020   ALBUMIN 4.3 06/14/2020   AST 47 (H) 06/14/2020   ALT 38 06/14/2020   ALKPHOS 105 06/14/2020   BILITOT 0.7 06/14/2020   GFRNONAA 87 06/14/2020   GFRAA 100 06/14/2020    Lab Results  Component Value Date   WBC 6.8 06/27/2020   NEUTROABS 4.7 06/27/2020   HGB 13.6 06/27/2020   HCT 40.1 06/27/2020   MCV 84.2 06/27/2020   PLT 240 06/27/2020     STUDIES: MR BRAIN W WO CONTRAST  Result Date: 05/29/2020  [facility] [address] [place], [place] [address] [phone] NEUROIMAGING REPORT STUDY DATE: 05/29/2020 PATIENT NAME: Gerald Olson DOB: [birth year] MRN: [id number] EXAM: MRI Brain with and without contrast ORDERING CLINICIAN: Frann Rider, NP CLINICAL HISTORY: 54 year old man with cognitive changes COMPARISON  FILMS: None TECHNIQUE:MRI of the brain with and without contrast was obtained utilizing 5 mm axial slices with T1, T2, T2 flair, SWI and diffusion weighted views.  T1 sagittal, T2 coronal and postcontrast views in the axial and coronal plane were obtained. CONTRAST: 20 ml [employer] IMAGING SITE: [facility], [place]. FINDINGS: On sagittal images, the spinal cord is imaged caudally to C2 and is normal in caliber.   The contents of the posterior fossa are of normal size and position.   The pituitary gland and optic chiasm appear normal.     There is mild generalized cortical atrophy, a little more than expected for age.  There are no abnormal extra-axial collections of fluid.  In the hemispheres, there are scattered T2/FLAIR hyperintense foci in the subcortical and deep white matter.  None of these appear to be acute.  The cerebellum and brainstem appears normal.   The deep gray matter appears normal.     Diffusion weighted images are normal.  Susceptibility weighted images are normal.   The orbits appear normal.   The VIIth/VIIIth nerve complex appears normal.  The mastoid air cells appear normal.  There is a mucous retention cyst in the left maxillary sinus.  The other paranasal sinuses appear normal.  Flow voids are identified within the major intracerebral arteries.  After the infusion of contrast material, a small developmental venous anomaly is noted in the right cerebellar hemisphere.     This MRI of the brain with and without contrast shows the following: 1.   There are no acute findings. 2.   Scattered T2/FLAIR punctate hyperintense foci in the subcortical and deep white matter.  This is a nonspecific finding and most likely represents mild chronic microvascular ischemic change.  Sequela of migraine and cardioembolic events could also have this pattern. 3.   Brain volume is mildly less than expected for age. 4.   After contrast was administered, a small developmental venous anomaly is  noted in the right cerebellar hemisphere.  These are unlikely to be clinically significant. INTERPRETING PHYSICIAN: Richard A. Felecia Shelling, MD, PhD, FAAN Certified in  Neuroimaging by [employer]   MR Cervical Spine w/o contrast  Result Date: 05/31/2020 CLINICAL DATA:  Spinal stenosis. Myelomalacia. Neck pain radiating down the left side. EXAM: MRI CERVICAL  SPINE WITHOUT CONTRAST TECHNIQUE: Multiplanar, multisequence MR imaging of the cervical spine was performed. No intravenous contrast was administered. COMPARISON:  11/21/2019 FINDINGS: Alignment: Straightening of the normal cervical lordosis. One or 2 mm of degenerative anterolisthesis at C7-T1. Vertebrae: No fracture or primary bone lesion. Cord: No primary cord lesion.  See below regarding stenosis. Posterior Fossa, vertebral arteries, paraspinal tissues: Negative Disc levels: Foramen magnum is widely patent. No significant finding at the C1-2 level. C2-3: Endplate osteophytes and bulging of the disc. No compressive central canal stenosis. Mild foraminal narrowing on the right. C3-4: Endplate osteophytes and bulging of the disc. AP diameter of the canal in the midline 9.3 mm. Bilateral foraminal encroachment right worse than left. Either C4 nerve could be affected. C4-5: Endplate osteophytes and bulging of the disc. AP diameter of the canal in the midline 7.6 mm. Slight triangulation of the cord without abnormal cord signal. Bilateral bony foraminal stenosis could affect either C5 nerve. C5-6: Spondylosis with endplate osteophytes and protruding disc material more prominent towards the left. AP diameter of the canal in the midline 6.8 mm. Effacement of the subarachnoid space and some cord deformity particularly on the left. No conclusive abnormal T2 signal within the cord. Bilateral foraminal stenosis could compress either or both C6 nerves. C6-7: Endplate osteophytes and bulging of the disc. AP diameter of the canal in the midline 8.2 mm. No  cord deformity. Bilateral foraminal stenosis left worse than right. Either C7 nerve could be affected, particularly the left. C7-T1: Bilateral facet arthropathy with 1 or 2 mm of anterolisthesis. No disc pathology. No compressive canal stenosis. Bilateral foraminal stenosis could affect either C8 nerve. Edematous change of the facet joints could certainly relate to regional pain. IMPRESSION: C2-3: Mild foraminal narrowing on the right. C3-4: Spondylosis with bilateral foraminal narrowing right worse than left. Either C4 nerve could be affected. C4-5: Spondylosis with bilateral foraminal narrowing. Either C5 nerve could be affected. Canal narrowing with AP diameter of 7.6 mm. Slight deformity of the cord without abnormal cord signal. C5-6: Spondylosis with bilateral foraminal narrowing. Findings more pronounced on the left. Either C6 nerve could be affected. Canal narrowing worse on the left with deformity of the cord. I do not see conclusive T2 signal within the cord. AP diameter of the canal in the midline only 6.8 mm. The patient could be at risk of developing myelopathy at this level. C6-7: Spondylosis with bilateral foraminal narrowing. Either C7 nerve could be affected. Somewhat worse on the left. C7-T1: Facet arthropathy with 1 or 2 mm of anterolisthesis and edematous change. Bilateral foraminal stenosis could affect either C8 nerve. The facet arthropathy is likely painful. Electronically Signed   By: Nelson Chimes M.D.   On: 05/31/2020 14:46    ASSESSMENT: Splenomegaly, mesenteric lymphadenopathy.  PLAN:    1. Splenomegaly, mesenteric lymphadenopathy: CT scan results from 03/01/2020 reviewed independently with mild splenomegaly as well as mesenteric lymphadenopathy of unclear etiology. Repeat CT scan in the next 1 to 2 weeks to assess for interval change. Patient does not have an elevated white count and peripheral blood flow cytometry and BCR-ABL mutation are pending at time of dictation. Patient will  have video assisted telemedicine visit 1 to 2 days after his CT scan to discuss the results. 2. Cognitive complaints: MRI of the brain did not reveal any anatomic or structural abnormalities. 3. Fatigue: Patient's hemoglobin is within normal limits. Have ordered thyroid panel with TSH for completeness. 4. Iron deficiency without anemia: Recommended dietary changes.  I spent  a total of 60 minutes reviewing chart data, face-to-face evaluation with the patient, counseling and coordination of care as detailed above.   Patient expressed understanding and was in agreement with this plan. He also understands that He can call clinic at any time with any questions, concerns, or complaints.   Cancer Staging No matching staging information was found for the patient.  Lloyd Huger, MD   06/28/2020 6:57 AM

## 2020-06-27 ENCOUNTER — Inpatient Hospital Stay: Payer: Medicaid Other

## 2020-06-27 ENCOUNTER — Other Ambulatory Visit: Payer: Self-pay

## 2020-06-27 ENCOUNTER — Encounter: Payer: Self-pay | Admitting: Oncology

## 2020-06-27 ENCOUNTER — Inpatient Hospital Stay: Payer: Medicaid Other | Attending: Oncology | Admitting: Oncology

## 2020-06-27 VITALS — BP 171/100 | HR 106 | Temp 98.4°F | Resp 20 | Wt 236.3 lb

## 2020-06-27 DIAGNOSIS — I10 Essential (primary) hypertension: Secondary | ICD-10-CM | POA: Insufficient documentation

## 2020-06-27 DIAGNOSIS — E1142 Type 2 diabetes mellitus with diabetic polyneuropathy: Secondary | ICD-10-CM | POA: Diagnosis not present

## 2020-06-27 DIAGNOSIS — R590 Localized enlarged lymph nodes: Secondary | ICD-10-CM

## 2020-06-27 DIAGNOSIS — Z87891 Personal history of nicotine dependence: Secondary | ICD-10-CM | POA: Insufficient documentation

## 2020-06-27 DIAGNOSIS — K7030 Alcoholic cirrhosis of liver without ascites: Secondary | ICD-10-CM | POA: Diagnosis not present

## 2020-06-27 DIAGNOSIS — Z79899 Other long term (current) drug therapy: Secondary | ICD-10-CM | POA: Diagnosis not present

## 2020-06-27 DIAGNOSIS — C9210 Chronic myeloid leukemia, BCR/ABL-positive, not having achieved remission: Secondary | ICD-10-CM | POA: Insufficient documentation

## 2020-06-27 DIAGNOSIS — R5383 Other fatigue: Secondary | ICD-10-CM | POA: Insufficient documentation

## 2020-06-27 DIAGNOSIS — R531 Weakness: Secondary | ICD-10-CM | POA: Insufficient documentation

## 2020-06-27 DIAGNOSIS — R161 Splenomegaly, not elsewhere classified: Secondary | ICD-10-CM | POA: Diagnosis not present

## 2020-06-27 DIAGNOSIS — H409 Unspecified glaucoma: Secondary | ICD-10-CM | POA: Diagnosis not present

## 2020-06-27 DIAGNOSIS — F329 Major depressive disorder, single episode, unspecified: Secondary | ICD-10-CM | POA: Insufficient documentation

## 2020-06-27 LAB — CBC WITH DIFFERENTIAL/PLATELET
Abs Immature Granulocytes: 0.02 10*3/uL (ref 0.00–0.07)
Basophils Absolute: 0 10*3/uL (ref 0.0–0.1)
Basophils Relative: 1 %
Eosinophils Absolute: 0.1 10*3/uL (ref 0.0–0.5)
Eosinophils Relative: 1 %
HCT: 40.1 % (ref 39.0–52.0)
Hemoglobin: 13.6 g/dL (ref 13.0–17.0)
Immature Granulocytes: 0 %
Lymphocytes Relative: 20 %
Lymphs Abs: 1.4 10*3/uL (ref 0.7–4.0)
MCH: 28.6 pg (ref 26.0–34.0)
MCHC: 33.9 g/dL (ref 30.0–36.0)
MCV: 84.2 fL (ref 80.0–100.0)
Monocytes Absolute: 0.6 10*3/uL (ref 0.1–1.0)
Monocytes Relative: 9 %
Neutro Abs: 4.7 10*3/uL (ref 1.7–7.7)
Neutrophils Relative %: 69 %
Platelets: 240 10*3/uL (ref 150–400)
RBC: 4.76 MIL/uL (ref 4.22–5.81)
RDW: 13.2 % (ref 11.5–15.5)
WBC: 6.8 10*3/uL (ref 4.0–10.5)
nRBC: 0 % (ref 0.0–0.2)

## 2020-06-27 LAB — IRON AND TIBC
Iron: 42 ug/dL — ABNORMAL LOW (ref 45–182)
Saturation Ratios: 12 % — ABNORMAL LOW (ref 17.9–39.5)
TIBC: 357 ug/dL (ref 250–450)
UIBC: 315 ug/dL

## 2020-06-27 LAB — FERRITIN: Ferritin: 11 ng/mL — ABNORMAL LOW (ref 24–336)

## 2020-06-27 LAB — VITAMIN B12: Vitamin B-12: 540 pg/mL (ref 180–914)

## 2020-06-27 NOTE — Progress Notes (Signed)
New patient here today referred for splenomegaly, Mesenteric lymphadenopathy. He states he been having a lot of fatigue. Has lots of pain in lower back and neck. Rates pain at 9.

## 2020-06-28 LAB — THYROID PANEL WITH TSH
Free Thyroxine Index: 1.2 (ref 1.2–4.9)
T3 Uptake Ratio: 23 % — ABNORMAL LOW (ref 24–39)
T4, Total: 5.2 ug/dL (ref 4.5–12.0)
TSH: 3.97 u[IU]/mL (ref 0.450–4.500)

## 2020-06-29 LAB — COMP PANEL: LEUKEMIA/LYMPHOMA

## 2020-07-01 ENCOUNTER — Ambulatory Visit (INDEPENDENT_AMBULATORY_CARE_PROVIDER_SITE_OTHER): Payer: Medicaid Other | Admitting: Orthopaedic Surgery

## 2020-07-01 ENCOUNTER — Encounter: Payer: Self-pay | Admitting: Orthopaedic Surgery

## 2020-07-01 ENCOUNTER — Other Ambulatory Visit: Payer: Self-pay

## 2020-07-01 VITALS — Ht 74.0 in | Wt 236.0 lb

## 2020-07-01 DIAGNOSIS — M48061 Spinal stenosis, lumbar region without neurogenic claudication: Secondary | ICD-10-CM | POA: Diagnosis not present

## 2020-07-01 DIAGNOSIS — M4802 Spinal stenosis, cervical region: Secondary | ICD-10-CM | POA: Diagnosis not present

## 2020-07-03 NOTE — Progress Notes (Signed)
[place]  Telephone:(336) [phone] Fax:(336) [phone]  ID: [name] OB: 05/19/1966  MR#: [id number]  RAQ#:[id number]  Patient Care Team: Doreen Beam, FNP as PCP - General (Family Medicine)  I connected with [name] on 07/10/20 at  2:30 PM EST by video enabled telemedicine visit and verified that I am speaking with the correct person using two identifiers.   I discussed the limitations, risks, security and privacy concerns of performing an evaluation and management service by telemedicine and the availability of in-person appointments. I also discussed with the patient that there may be a patient responsible charge related to this service. The patient expressed understanding and agreed to proceed.   Other persons participating in the visit and their role in the encounter: Patient, MD.  Patient's location: Home. Provider's location: Clinic.  CHIEF COMPLAINT: CML.  INTERVAL HISTORY: Patient agreed to video assisted telemedicine visit for further evaluation, discussion of his laboratory results, and treatment planning.  He continues have chronic weakness and fatigue, but otherwise feels well.  He has no neurologic complaints. He denies any recent fevers or illnesses. He has a good appetite and denies weight loss. He has no chest pain, shortness of breath, cough, or hemoptysis. He denies any nausea, vomiting, constipation, or diarrhea. He has no melena or hematochezia. He has no urinary complaints.  Patient offers no further specific complaints today.  REVIEW OF SYSTEMS:   Review of Systems  Constitutional: Negative.  Negative for fever and weight loss.  Respiratory: Negative.  Negative for cough, hemoptysis and shortness of breath.   Cardiovascular: Negative.  Negative for chest pain and leg swelling.  Gastrointestinal: Negative.  Negative for abdominal pain and blood in stool.  Genitourinary: Negative.  Negative for dysuria, flank pain and  hematuria.  Musculoskeletal: Negative.  Negative for back pain.  Skin: Negative.  Negative for rash.  Neurological: Negative.  Negative for dizziness, focal weakness, weakness and headaches.  Psychiatric/Behavioral: Negative.  The patient is not nervous/anxious.     As per HPI. Otherwise, a complete review of systems is negative.  PAST MEDICAL HISTORY: Past Medical History:  Diagnosis Date  . ADHD (attention deficit hyperactivity disorder)    Diagnosed during teenage years  . Alcohol use disorder    On average 14-21 drinks per week  . Alcoholic cirrhosis of liver    Per patient - condition is not related to moderate alcohol use but rather prolonged NSAID use and recent abuse.  . Allergy   . Calculus of gallbladder without cholecystitis without obstruction   . Chronic pain    Neck/lower back  . Degenerative disc disease, lumbar   . Diabetic polyneuropathy   . Essential hypertension, benign   . Generalized anxiety disorder   . Glaucoma    Per patient report  . Headache   . 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 Major depressive disorder   . Other spondylosis with radiculopathy, cervical region   . Retinopathy    Right eye; per patient report  . Type 2 diabetes mellitus with hyperglycemia     PAST SURGICAL HISTORY: Past Surgical History:  Procedure Laterality Date  . CHOLECYSTECTOMY N/A 06/02/2018   Procedure: LAPAROSCOPIC CHOLECYSTECTOMY;  Surgeon: Vickie Epley, MD;  Location: [facility] ORS;  Service: General;  Laterality: N/A;  . KNEE ARTHROSCOPY     30 Years ago    FAMILY HISTORY: Family History  Problem Relation Age of Onset  .  Aneurysm Father   . Multiple sclerosis Sister   . Emphysema Maternal Grandfather   . Diabetes Neg Hx     ADVANCED DIRECTIVES (Y/N):  N  HEALTH MAINTENANCE: Social History   Tobacco Use  . Smoking status: Former Smoker    Quit date: 06/25/2003    Years since quitting: 17.0  .  Smokeless tobacco: Never Used  . Tobacco comment: Currently vapes  Vaping Use  . Vaping Use: Every day  Substance Use Topics  . Alcohol use: Yes    Alcohol/week: 14.0 - 21.0 standard drinks    Types: 14 - 21 Cans of beer per week    Comment: 2-3 beers per night on average; sometimes up to 6  . Drug use: Yes    Types: Marijuana    Comment: 1/week to help sleep; edibles, does not smoke     Colonoscopy:  PAP:  Bone density:  Lipid panel:  Allergies  Allergen Reactions  . Hydrocodone Itching    Itching, face tingling, dizziness.  [name] [Oxycodone Hcl] Itching    Current Outpatient Medications  Medication Sig Dispense Refill  . Accu-Chek FastClix Lancets MISC Use as instructed to check blood sugar up to 3 times daily. (Patient taking differently: 1 each by Other route 2 (two) times daily. ) 102 each 11  . ALPHA-LIPOIC ACID PO Take by mouth.    . Blood Glucose Monitoring Suppl (ACCU-CHEK GUIDE ME) w/Device KIT 1 kit by Does not apply route 3 (three) times daily. (Patient taking differently: 1 kit by Does not apply route 2 (two) times daily. ) 1 kit 0  . calcium-vitamin D (OSCAL WITH D) 500-200 MG-UNIT tablet Take 1 tablet by mouth.    . dapagliflozin propanediol (FARXIGA) 5 MG TABS tablet Take 1 tablet (5 mg total) by mouth daily before breakfast. 90 tablet 3  . DULoxetine (CYMBALTA) 60 MG capsule Take 1 capsule (60 mg total) by mouth 2 (two) times daily. 60 capsule 1  . glucose blood (ACCU-CHEK GUIDE) test strip Use as instructed to check blood sugar up to 3 times daily. (Patient taking differently: 1 each by Other route 2 (two) times daily. ) 100 each 12  . insulin glargine (LANTUS SOLOSTAR) 100 UNIT/ML Solostar Pen Inject 22 Units into the skin every morning. And pen needles 1/day 15 mL 2  . lisinopril (ZESTRIL) 20 MG tablet Take 1 tablet (20 mg total) by mouth daily. 90 tablet 0  . metFORMIN (GLUCOPHAGE-XR) 500 MG 24 hr tablet Take 2 tablets (1,000 mg total) by mouth 2 (two)  times daily with a meal. 120 tablet 2  . methocarbamol (ROBAXIN) 500 MG tablet Take 2 tablets (1,000 mg total) by mouth every 6 (six) hours as needed for muscle spasms. 90 tablet 0  . Multiple Vitamins-Minerals (MULTIVITAMIN WITH MINERALS) tablet Take 1 tablet by mouth daily.     . Nutritional Supplements (DHEA PO) Take 100 mg by mouth daily.    Marland Kitchen OVER THE COUNTER MEDICATION Black seed oil 2000per day    . topiramate (TOPAMAX) 50 MG tablet Take 1 tablet (50 mg total) by mouth 2 (two) times daily. 60 tablet 5  . TRUEPLUS PEN NEEDLES 32G X 4 MM MISC 1 each by Other route daily.     Marland Kitchen zinc gluconate 50 MG tablet Take 50 mg by mouth daily.     No current facility-administered medications for this visit.    OBJECTIVE: There were no vitals filed for this visit.   There is no height or  weight on file to calculate BMI.    ECOG FS:0 - Asymptomatic  General: Well-developed, well-nourished, no acute distress. HEENT: Normocephalic. Neuro: Alert, answering all questions appropriately. Cranial nerves grossly intact. Psych: Normal affect.   LAB RESULTS:  Lab Results  Component Value Date   NA 134 06/14/2020   K 4.1 06/14/2020   CL 98 06/14/2020   CO2 21 06/14/2020   GLUCOSE 114 (H) 06/14/2020   BUN 13 06/14/2020   CREATININE 0.99 06/14/2020   CALCIUM 9.2 06/14/2020   PROT 6.8 06/14/2020   ALBUMIN 4.3 06/14/2020   AST 47 (H) 06/14/2020   ALT 38 06/14/2020   ALKPHOS 105 06/14/2020   BILITOT 0.7 06/14/2020   GFRNONAA 87 06/14/2020   GFRAA 100 06/14/2020    Lab Results  Component Value Date   WBC 6.8 06/27/2020   NEUTROABS 4.7 06/27/2020   HGB 13.6 06/27/2020   HCT 40.1 06/27/2020   MCV 84.2 06/27/2020   PLT 240 06/27/2020     STUDIES: No results found.  ASSESSMENT: CML PLAN:    1.  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  Despite this, he will require treatment with Gleevec 400 mg daily.   Prior to initiating treatment, patient will require bone marrow biopsy.  CT scan to reevaluate splenomegaly and mesenteric lymphadenopathy is pending at time of dictation.  Patient will return to clinic approximately 1 week after his bone marrow biopsy for further evaluation and initiation of treatment.  2. Cognitive complaints: MRI of the brain did not reveal any anatomic or structural abnormalities. 3. Fatigue: Multifactorial, unclear if underlying CML is related given the minimal disease burden. 4. Iron deficiency without anemia: Recommended dietary changes. 5.  Splenomegaly/mesenteric lymphadenopathy: Repeat CT scans as above.   I provided 30 minutes of face-to-face video visit time during this encounter which included chart review, counseling, and coordination of care as documented above.   Patient expressed understanding and was in agreement with this plan. He also understands that He can call clinic at any time with any questions, concerns, or complaints.   Cancer Staging No matching staging information was found for the patient.  Lloyd Huger, MD   07/10/2020 11:29 AM

## 2020-07-03 NOTE — Progress Notes (Signed)
Office Visit Note   Patient: Gerald Olson           Date of Birth: Dec 02, 1965           MRN: [id number] Visit Date: 07/01/2020              Requested by: Antony Blackbird, MD [place],  [place] [address] PCP: Doreen Beam, FNP   Assessment & Plan: Visit Diagnoses:  1. Spinal stenosis of cervical region   2. Foraminal stenosis of cervical region   3. Lumbar foraminal stenosis     Plan: We will set patient up for some physical therapy for treatment of his back pain with previous scan showing some disc degeneration.  He also has chronic neck pain and they can treat him for his neck problem as well.  Follow-Up Instructions: No follow-ups on file.   Orders:  Orders Placed This Encounter  Procedures  . Ambulatory referral to Physical Therapy   No orders of the defined types were placed in this encounter.     Procedures: No procedures performed   Clinical Data: No additional findings.   Subjective: Chief Complaint  Patient presents with  . Neck - Follow-up, Pain  . Lower Back - Follow-up, Pain    HPI 54 year old male returns.  We previously discussed possible two-level cervical fusion C4-5 C5-6 for spondylosis with stenosis.  He states his back is not giving him more trouble he is ambulating with a cane.  He states he has some pain down his left leg at times and states he almost fell again.  He states he has had slightly better sleeping since the burning in his neck does not go into his shoulders as much as it previously did.  Patient not working.  Has been treated with tramadol Celebrex muscle relaxants Robaxin aspirin, Naprosyn, tramadol.  Review of Systems 14 systems update positive for anxiety depression type 2 diabetes previous alcohol abuse.     Objective: Vital Signs: Ht 6\' 2"  (1.88 m)   Wt 236 lb (107 kg)   BMI 30.30 kg/m   Physical Exam Constitutional:      Appearance: He is well-developed.  HENT:     Head: Normocephalic and  atraumatic.  Eyes:     Pupils: Pupils are equal, round, and reactive to light.  Neck:     Thyroid: No thyromegaly.     Trachea: No tracheal deviation.  Cardiovascular:     Rate and Rhythm: Normal rate.  Pulmonary:     Effort: Pulmonary effort is normal.     Breath sounds: No wheezing.  Abdominal:     General: Bowel sounds are normal.     Palpations: Abdomen is soft.  Skin:    General: Skin is warm and dry.     Capillary Refill: Capillary refill takes less than 2 seconds.  Neurological:     Mental Status: He is alert and oriented to person, place, and time.  Psychiatric:        Behavior: Behavior normal.        Thought Content: Thought content normal.        Judgment: Judgment normal.     Ortho Exam patient is amatory with a cane to get on and off the exam table.  Negative Lhermitte, lower extremity reflexes are intact.  Negative logroll to the hips.  Specialty Comments:  No specialty comments available.  Imaging: No results found.   PMFS History: Patient Active Problem List   Diagnosis Date  Noted  . Occult blood in stools 06/15/2020  . Mesenteric lymphadenopathy 06/14/2020  . Splenomegaly- mild  06/14/2020  . Screening for blood or protein in urine 06/14/2020  . Elevated blood pressure reading 06/14/2020  . History of cannabis abuse 06/14/2020  . Recurrent major depressive disorder, in partial remission ([place]) 06/09/2020  . Anxiety 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 06/09/2020  . Alcohol use disorder, moderate, dependence ([place]) 06/09/2020  . Spinal stenosis of cervical region 06/03/2020  . Frequent falls 05/23/2020  . Foraminal stenosis of cervical region 05/09/2020  . Diabetic polyneuropathy   . ADHD (attention deficit hyperactivity disorder)   . Generalized anxiety disorder   . Major depressive disorder   . Other spondylosis with radiculopathy, cervical region 09/22/2019  . Low back pain 07/28/2019  . Essential hypertension, benign 01/28/2019  . Alcoholic  cirrhosis of liver 06/02/2018  . Type 2 diabetes mellitus with hyperglycemia 04/25/2018  . Alcohol use disorder, mild, abuse 04/25/2018   Past Medical History:  Diagnosis Date  . ADHD (attention deficit hyperactivity disorder)    Diagnosed during teenage years  . Alcohol use disorder    On average 14-21 drinks per week  . Alcoholic cirrhosis of liver    Per patient - condition is not related to moderate alcohol use but rather prolonged NSAID use and recent abuse.  . Allergy   . Calculus of gallbladder without cholecystitis without obstruction   . Chronic pain    Neck/lower back  . Degenerative disc disease, lumbar   . Diabetic polyneuropathy   . Essential hypertension, benign   . Generalized anxiety disorder   . Glaucoma    Per patient report  . Headache   . 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 Major depressive disorder   . Other spondylosis with radiculopathy, cervical region   . Retinopathy    Right eye; per patient report  . Type 2 diabetes mellitus with hyperglycemia     Family History  Problem Relation Age of Onset  . Aneurysm Father   . Multiple sclerosis Sister   . Emphysema Maternal Grandfather   . Diabetes Neg Hx     Past Surgical History:  Procedure Laterality Date  . CHOLECYSTECTOMY N/A 06/02/2018   Procedure: LAPAROSCOPIC CHOLECYSTECTOMY;  Surgeon: Vickie Epley, MD;  Location: [facility] ORS;  Service: General;  Laterality: N/A;  . KNEE ARTHROSCOPY     30 Years ago   Social History   Occupational History  . Not on file  Tobacco Use  . Smoking status: Former Smoker    Quit date: 06/25/2003    Years since quitting: 17.0  . Smokeless tobacco: Never Used  . Tobacco comment: Currently vapes  Vaping Use  . Vaping Use: Every day  Substance and Sexual Activity  . Alcohol use: Yes    Alcohol/week: 14.0 - 21.0 standard drinks    Types: 14 - 21 Cans of beer per week    Comment: 2-3 beers per night on  average; sometimes up to 6  . Drug use: Yes    Types: Marijuana    Comment: 1/week to help sleep; edibles, does not smoke  . Sexual activity: Yes

## 2020-07-04 LAB — BCR-ABL1, CML/ALL, PCR, QUANT: b2a2 transcript: 0.673 %

## 2020-07-05 ENCOUNTER — Other Ambulatory Visit: Payer: Self-pay

## 2020-07-05 ENCOUNTER — Ambulatory Visit (INDEPENDENT_AMBULATORY_CARE_PROVIDER_SITE_OTHER): Payer: Medicaid Other | Admitting: Endocrinology

## 2020-07-05 ENCOUNTER — Encounter: Payer: Self-pay | Admitting: Endocrinology

## 2020-07-05 VITALS — BP 132/64 | HR 107 | Resp 18 | Ht 74.0 in | Wt 234.8 lb

## 2020-07-05 DIAGNOSIS — E1165 Type 2 diabetes mellitus with hyperglycemia: Secondary | ICD-10-CM | POA: Diagnosis not present

## 2020-07-05 NOTE — Patient Instructions (Addendum)
check your blood sugar twice a day.  vary the time of day when you check, between before the 3 meals, and at bedtime.  also check if you have symptoms of your blood sugar being too high or too low.  please keep a record of the readings and bring it to your next appointment here (or you can bring the meter itself).  You can write it on any piece of paper.  please call us sooner if your blood sugar goes below 70, or if you have a lot of readings over 200. Please call the number, to ask about the status of the [place], application, and:  Please continue the same metformin and Lantus.  Please come back for a follow-up appointment in 2 months.

## 2020-07-05 NOTE — Progress Notes (Signed)
Subjective:    Patient ID: Gerald Olson, male    DOB: 26-Aug-1966, 54 y.o.   MRN: [id number]  HPI Pt returns for f/u of diabetes mellitus: DM type: Insulin-requiring type 2 Dx'ed: 2006 (he lost 70 lbs, so normoglycemia returned until 9924) Complications: PN, PAD, and DR Therapy: insulin since 2020 DKA: never Severe hypoglycemia: never Pancreatitis: once (2021), on Victoza Pancreatic imaging: normal on 2021 CT SDOH: he gets [place] from pt assist, but medicaid pays for lantus.  Other: He needs to be cleared for C-spine surgery; He declines multiple daily injections.   Interval history: no cbg record, but states cbg's vary from 82-210.  There is no trend throughout the day.  Pt says he did not hear back from [place] pt assist (he has a copy of the form).   Past Medical History:  Diagnosis Date  . ADHD (attention deficit hyperactivity disorder)    Diagnosed during teenage years  . Alcohol use disorder    On average 14-21 drinks per week  . Alcoholic cirrhosis of liver    Per patient - condition is not related to moderate alcohol use but rather prolonged NSAID use and recent abuse.  . Allergy   . Calculus of gallbladder without cholecystitis without obstruction   . Chronic pain    Neck/lower back  . Degenerative disc disease, lumbar   . Diabetic polyneuropathy   . Essential hypertension, benign   . Generalized anxiety disorder   . Glaucoma    Per patient report  . Headache   . 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 Major depressive disorder   . Other spondylosis with radiculopathy, cervical region   . Retinopathy    Right eye; per patient report  . Type 2 diabetes mellitus with hyperglycemia     Past Surgical History:  Procedure Laterality Date  . CHOLECYSTECTOMY N/A 06/02/2018   Procedure: LAPAROSCOPIC CHOLECYSTECTOMY;  Surgeon: Vickie Epley, MD;  Location: [facility] ORS;  Service: General;  Laterality: N/A;  . KNEE  ARTHROSCOPY     30 Years ago    Social History   Socioeconomic History  . Marital status: Single    Spouse name: Not on file  . Number of children: Not on file  . Years of education: 63  . Highest education level: Master's degree (e.g., MA, MS, MEng, MEd, MSW, MBA)  Occupational History  . Not on file  Tobacco Use  . Smoking status: Former Smoker    Quit date: 06/25/2003    Years since quitting: 17.0  . Smokeless tobacco: Never Used  . Tobacco comment: Currently vapes  Vaping Use  . Vaping Use: Every day  Substance and Sexual Activity  . Alcohol use: Yes    Alcohol/week: 14.0 - 21.0 standard drinks    Types: 14 - 21 Cans of beer per week    Comment: 2-3 beers per night on average; sometimes up to 6  . Drug use: Yes    Types: Marijuana    Comment: 1/week to help sleep; edibles, does not smoke  . Sexual activity: Yes  Other Topics Concern  . Not on file  Social History Narrative  . Not on file   Social Determinants of Health   Financial Resource Strain:   . Difficulty of Paying Living Expenses: Not on file  Food Insecurity:   . Worried About Charity fundraiser in the Last Year: Not on file  . Ran Out of  Food in the Last Year: Not on file  Transportation Needs:   . Lack of Transportation (Medical): Not on file  . Lack of Transportation (Non-Medical): Not on file  Physical Activity:   . Days of Exercise per Week: Not on file  . Minutes of Exercise per Session: Not on file  Stress:   . Feeling of Stress : Not on file  Social Connections:   . Frequency of Communication with Friends and Family: Not on file  . Frequency of Social Gatherings with Friends and Family: Not on file  . Attends Religious Services: Not on file  . Active Member of Clubs or Organizations: Not on file  . Attends Archivist Meetings: Not on file  . Marital Status: Not on file  Intimate Partner Violence:   . Fear of Current or Ex-Partner: Not on file  . Emotionally Abused: Not on  file  . Physically Abused: Not on file  . Sexually Abused: Not on file    Current Outpatient Medications on File Prior to Visit  Medication Sig Dispense Refill  . Accu-Chek FastClix Lancets MISC Use as instructed to check blood sugar up to 3 times daily. (Patient taking differently: 1 each by Other route 2 (two) times daily. ) 102 each 11  . ALPHA-LIPOIC ACID PO Take by mouth.    . Blood Glucose Monitoring Suppl (ACCU-CHEK GUIDE ME) w/Device KIT 1 kit by Does not apply route 3 (three) times daily. (Patient taking differently: 1 kit by Does not apply route 2 (two) times daily. ) 1 kit 0  . calcium-vitamin D (OSCAL WITH D) 500-200 MG-UNIT tablet Take 1 tablet by mouth.    . dapagliflozin propanediol (FARXIGA) 5 MG TABS tablet Take 1 tablet (5 mg total) by mouth daily before breakfast. 90 tablet 3  . DULoxetine (CYMBALTA) 60 MG capsule Take 1 capsule (60 mg total) by mouth 2 (two) times daily. 60 capsule 1  . glucose blood (ACCU-CHEK GUIDE) test strip Use as instructed to check blood sugar up to 3 times daily. (Patient taking differently: 1 each by Other route 2 (two) times daily. ) 100 each 12  . insulin glargine (LANTUS SOLOSTAR) 100 UNIT/ML Solostar Pen Inject 22 Units into the skin every morning. And pen needles 1/day 15 mL 2  . lisinopril (ZESTRIL) 20 MG tablet Take 1 tablet (20 mg total) by mouth daily. 90 tablet 0  . metFORMIN (GLUCOPHAGE-XR) 500 MG 24 hr tablet Take 2 tablets (1,000 mg total) by mouth 2 (two) times daily with a meal. 120 tablet 2  . methocarbamol (ROBAXIN) 500 MG tablet Take 2 tablets (1,000 mg total) by mouth every 6 (six) hours as needed for muscle spasms. 90 tablet 0  . Multiple Vitamins-Minerals (MULTIVITAMIN WITH MINERALS) tablet Take 1 tablet by mouth daily.     . Nutritional Supplements (DHEA PO) Take 100 mg by mouth daily.    Marland Kitchen OVER THE COUNTER MEDICATION Black seed oil 2000per day    . topiramate (TOPAMAX) 50 MG tablet Take 1 tablet (50 mg total) by mouth 2 (two)  times daily. 60 tablet 5  . TRUEPLUS PEN NEEDLES 32G X 4 MM MISC 1 each by Other route daily.     Marland Kitchen zinc gluconate 50 MG tablet Take 50 mg by mouth daily.     No current facility-administered medications on file prior to visit.    Allergies  Allergen Reactions  . Hydrocodone Itching    Itching, face tingling, dizziness.  [name] [Oxycodone Hcl] Itching  Family History  Problem Relation Age of Onset  . Aneurysm Father   . Multiple sclerosis Sister   . Emphysema Maternal Grandfather   . Diabetes Neg Hx     BP 132/64   Pulse (!) 107   Resp 18   Ht $R'6\' 2"'FA$  (1.88 m)   Wt 234 lb 12.8 oz (106.5 kg)   SpO2 98%   BMI 30.15 kg/m    Review of Systems     Objective:   Physical Exam VITAL SIGNS:  See vs page GENERAL: no distress Pulses: dorsalis pedis intact bilat.   MSK: no deformity of the feet CV: no leg edema Skin:  no ulcer on the feet.  normal color and temp on the feet. Neuro: sensation is intact to touch on the feet, but severely decreased from normal.    Lab Results  Component Value Date   HGBA1C 7.4 (H) 06/14/2020    Lab Results  Component Value Date   CREATININE 0.99 06/14/2020   BUN 13 06/14/2020   NA 134 06/14/2020   K 4.1 06/14/2020   CL 98 06/14/2020   CO2 21 06/14/2020      Assessment & Plan:  Insulin-requiring type 2 DM, with PAD: this is the best control this pt should aim for, given this regimen, which does match insulin to his changing needs throughout the day Obesity: Wilder Glade might help.  Patient Instructions  check your blood sugar twice a day.  vary the time of day when you check, between before the 3 meals, and at bedtime.  also check if you have symptoms of your blood sugar being too high or too low.  please keep a record of the readings and bring it to your next appointment here (or you can bring the meter itself).  You can write it on any piece of paper.  please call us sooner if your blood sugar goes below 70, or if you have a lot of  readings over 200. Please call the number, to ask about the status of the [place], application, and:  Please continue the same metformin and Lantus.  Please come back for a follow-up appointment in 2 months.

## 2020-07-07 ENCOUNTER — Ambulatory Visit: Payer: Medicaid Other

## 2020-07-08 ENCOUNTER — Encounter: Payer: Self-pay | Admitting: Oncology

## 2020-07-08 ENCOUNTER — Inpatient Hospital Stay (HOSPITAL_BASED_OUTPATIENT_CLINIC_OR_DEPARTMENT_OTHER): Payer: Medicaid Other | Admitting: Oncology

## 2020-07-08 ENCOUNTER — Telehealth: Payer: Self-pay | Admitting: Pharmacist

## 2020-07-08 ENCOUNTER — Telehealth: Payer: Self-pay | Admitting: Pharmacy Technician

## 2020-07-08 ENCOUNTER — Other Ambulatory Visit: Payer: Self-pay

## 2020-07-08 DIAGNOSIS — R161 Splenomegaly, not elsewhere classified: Secondary | ICD-10-CM | POA: Diagnosis not present

## 2020-07-08 DIAGNOSIS — R590 Localized enlarged lymph nodes: Secondary | ICD-10-CM | POA: Diagnosis not present

## 2020-07-08 DIAGNOSIS — C9210 Chronic myeloid leukemia, BCR/ABL-positive, not having achieved remission: Secondary | ICD-10-CM

## 2020-07-08 NOTE — Telephone Encounter (Signed)
Error

## 2020-07-08 NOTE — Telephone Encounter (Signed)
Oral Oncology Patient Advocate Encounter  After completing a benefits investigation, prior authorization for Gleevec (Imatinib) is not required at this time through [employer] (managed Medicaid plan).  Patient's copay is $3.00  [place] Patient [place] Phone [phone] Fax [phone] 07/08/2020 1:47 PM

## 2020-07-08 NOTE — Progress Notes (Signed)
Patient denies any concerns today.  

## 2020-07-11 ENCOUNTER — Telehealth: Payer: Self-pay | Admitting: *Deleted

## 2020-07-11 ENCOUNTER — Other Ambulatory Visit: Payer: Self-pay | Admitting: Oncology

## 2020-07-11 MED ORDER — IMATINIB MESYLATE 400 MG PO TABS: 400.0000 mg | ORAL_TABLET | Freq: Every day | ORAL | 0 refills | Status: DC

## 2020-07-11 NOTE — Telephone Encounter (Signed)
Oral Oncology Student Pharmacist Encounter  Received new prescription for Gleevec (imatinib) for the treatment of newly diagnosed Chronic Myeloid Leukemia, planned duration until disease progression or toxicity.  Labs from 06/27/20 assessed, and no relevant abnormalities noted. Prescription dose and frequency assessed.   Current medication list in Epic reviewed, no DDIs identified:  Evaluated chart and no patient barriers to medication adherence identified.   Prescription has been e-scribed to the [facility] for benefits analysis and approval.  Oral Oncology Clinic will continue to follow for insurance authorization, copayment issues, initial counseling and start date.  Barton Dubois, PharmD Candidate [phone] [facility] Oral Chemotherapy [facility] [phone]  07/11/2020 3:42 PM

## 2020-07-11 NOTE — Telephone Encounter (Signed)
Patient notified of appt for CT guided bone marrow biopsy on 11/22 at 8:30, patient to arrive at 7:30 for procedure. Patient advised that IR nurse will reach out as well with further instructions. Patient informed that he will need a driver for this appt. Marcie Bal is working on moving patients CT scan up in the day so it can be performed after biopsy.   Can we please schedule patient 1 week after biopsy to see Dr. Grayland Ormond for results?

## 2020-07-11 NOTE — Telephone Encounter (Signed)
Bone marrow biopsy needed before starting Gleevec. °

## 2020-07-13 NOTE — OR Nursing (Signed)
Pt called told to have no liquids after 0430 am, no solids after midnight. Half dose of diabetic medications evening before. We switched his Abd CT to be done at [facility] at 11 am after his bone marrow biopsy. Pt is to remain NPO except for contrast post bone marrow biopsy. Pt states his mother will driving him home.

## 2020-07-13 NOTE — Telephone Encounter (Signed)
Oral Oncology Patient Advocate Encounter  I spoke with Gerald Olson yesterday afternoon 07/12/20 to set up delivery of [place].  Address verified for shipment.  Gleevec will be filled through [facility] and mailed 07/25/20 for delivery 07/26/20.  Patient was instructed to not start the medication when received, but to bring it with him to his appointment on 07/28/20.  [place] will call 7-10 days before next refill is due to complete adherence call and set up delivery of medication.     [place] Patient [place] Phone [phone] Fax [phone] 07/13/2020 9:40 AM

## 2020-07-15 ENCOUNTER — Other Ambulatory Visit: Payer: Self-pay | Admitting: Radiology

## 2020-07-18 ENCOUNTER — Ambulatory Visit
Admission: RE | Admit: 2020-07-18 | Discharge: 2020-07-18 | Disposition: A | Discharge status: Home / Self Care | Payer: Medicaid Other | Source: Ambulatory Visit | Attending: Oncology | Admitting: Oncology

## 2020-07-18 ENCOUNTER — Ambulatory Visit: Admission: RE | Admit: 2020-07-18 | Payer: Medicaid Other | Source: Ambulatory Visit

## 2020-07-18 ENCOUNTER — Other Ambulatory Visit: Payer: Self-pay

## 2020-07-18 DIAGNOSIS — I10 Essential (primary) hypertension: Secondary | ICD-10-CM | POA: Insufficient documentation

## 2020-07-18 DIAGNOSIS — I723 Aneurysm of iliac artery: Secondary | ICD-10-CM | POA: Diagnosis not present

## 2020-07-18 DIAGNOSIS — Z79899 Other long term (current) drug therapy: Secondary | ICD-10-CM | POA: Insufficient documentation

## 2020-07-18 DIAGNOSIS — Z7984 Long term (current) use of oral hypoglycemic drugs: Secondary | ICD-10-CM | POA: Insufficient documentation

## 2020-07-18 DIAGNOSIS — F17290 Nicotine dependence, other tobacco product, uncomplicated: Secondary | ICD-10-CM | POA: Insufficient documentation

## 2020-07-18 DIAGNOSIS — C9210 Chronic myeloid leukemia, BCR/ABL-positive, not having achieved remission: Secondary | ICD-10-CM | POA: Diagnosis not present

## 2020-07-18 DIAGNOSIS — K7689 Other specified diseases of liver: Secondary | ICD-10-CM | POA: Diagnosis not present

## 2020-07-18 DIAGNOSIS — R161 Splenomegaly, not elsewhere classified: Secondary | ICD-10-CM

## 2020-07-18 DIAGNOSIS — E119 Type 2 diabetes mellitus without complications: Secondary | ICD-10-CM | POA: Insufficient documentation

## 2020-07-18 DIAGNOSIS — R590 Localized enlarged lymph nodes: Secondary | ICD-10-CM | POA: Insufficient documentation

## 2020-07-18 DIAGNOSIS — C9110 Chronic lymphocytic leukemia of B-cell type not having achieved remission: Secondary | ICD-10-CM | POA: Diagnosis not present

## 2020-07-18 DIAGNOSIS — I700 Atherosclerosis of aorta: Secondary | ICD-10-CM | POA: Diagnosis not present

## 2020-07-18 DIAGNOSIS — K7460 Unspecified cirrhosis of liver: Secondary | ICD-10-CM | POA: Insufficient documentation

## 2020-07-18 LAB — CBC WITH DIFFERENTIAL/PLATELET
Abs Immature Granulocytes: 0.04 10*3/uL (ref 0.00–0.07)
Basophils Absolute: 0.1 10*3/uL (ref 0.0–0.1)
Basophils Relative: 1 %
Eosinophils Absolute: 0.2 10*3/uL (ref 0.0–0.5)
Eosinophils Relative: 2 %
HCT: 41.3 % (ref 39.0–52.0)
Hemoglobin: 13.8 g/dL (ref 13.0–17.0)
Immature Granulocytes: 0 %
Lymphocytes Relative: 24 %
Lymphs Abs: 2.3 10*3/uL (ref 0.7–4.0)
MCH: 28.5 pg (ref 26.0–34.0)
MCHC: 33.4 g/dL (ref 30.0–36.0)
MCV: 85.2 fL (ref 80.0–100.0)
Monocytes Absolute: 0.9 10*3/uL (ref 0.1–1.0)
Monocytes Relative: 9 %
Neutro Abs: 6.2 10*3/uL (ref 1.7–7.7)
Neutrophils Relative %: 64 %
Platelets: 286 10*3/uL (ref 150–400)
RBC: 4.85 MIL/uL (ref 4.22–5.81)
RDW: 13.7 % (ref 11.5–15.5)
WBC: 9.6 10*3/uL (ref 4.0–10.5)
nRBC: 0 % (ref 0.0–0.2)

## 2020-07-18 LAB — GLUCOSE, CAPILLARY: Glucose-Capillary: 167 mg/dL — ABNORMAL HIGH (ref 70–99)

## 2020-07-18 LAB — POCT I-STAT CREATININE: Creatinine, Ser: 1.1 mg/dL (ref 0.61–1.24)

## 2020-07-18 MED ORDER — HEPARIN SOD (PORK) LOCK FLUSH 100 UNIT/ML IV SOLN
INTRAVENOUS | Status: AC
  Filled 2020-07-18: qty 5

## 2020-07-18 MED ORDER — MIDAZOLAM HCL 5 MG/5ML IJ SOLN
INTRAMUSCULAR | Status: AC
  Filled 2020-07-18: qty 5

## 2020-07-18 MED ORDER — FENTANYL CITRATE (PF) 100 MCG/2ML IJ SOLN
INTRAMUSCULAR | Status: AC | PRN
Start: 2020-07-18 — End: 2020-07-18
  Administered 2020-07-18 (×2): 50 ug via INTRAVENOUS

## 2020-07-18 MED ORDER — IOHEXOL 300 MG/ML  SOLN
100.0000 mL | Freq: Once | INTRAMUSCULAR | Status: AC | PRN
  Administered 2020-07-18: 100 mL via INTRAVENOUS

## 2020-07-18 MED ORDER — MIDAZOLAM HCL 2 MG/2ML IJ SOLN
INTRAMUSCULAR | Status: AC | PRN
  Administered 2020-07-18 (×2): 1 mg via INTRAVENOUS

## 2020-07-18 MED ORDER — SODIUM CHLORIDE 0.9 % IV SOLN
INTRAVENOUS | Status: DC
  Administered 2020-07-18: 20 mL via INTRAVENOUS

## 2020-07-18 MED ORDER — FENTANYL CITRATE (PF) 100 MCG/2ML IJ SOLN
INTRAMUSCULAR | Status: AC
  Filled 2020-07-18: qty 2

## 2020-07-18 NOTE — Progress Notes (Signed)
Patient completed BMB without adverse events.  Scheduled for CT Scan with contrast. Completed first bottle of contrast, #2 to start @ 1045.  Patient drinking water, no c/o's at this time, denies any pain, did received 123mcq fentanyl IV for procedure. Will continue to monitor.

## 2020-07-18 NOTE — H&P (Signed)
Chief Complaint: Splenomegaly and mesenteric lymphadenopathy BCR ABL positive. Request is for bone marrow biopsy  Referring Physician(s): Finnegan,Timothy J  Supervising Physician: Sandi Mariscal  Patient Status: [facility] - Out-pt  History of Present Illness: Gerald Olson is a 54 y.o. male History of DM, HTN, Cirrhosis. Found to have splenomegaly and mesenteric lymphadenopathy BCR ABL positive.Team is requesting a bone marrow biopsy for further evaluation prior to the start of treatment for possible CML.  Patient ids reporting left hip pain related to a recent fall.   Past Medical History:  Diagnosis Date  . ADHD (attention deficit hyperactivity disorder)    Diagnosed during teenage years  . Alcohol use disorder    On average 14-21 drinks per week  . Alcoholic cirrhosis of liver    Per patient - condition is not related to moderate alcohol use but rather prolonged NSAID use and recent abuse.  . Allergy   . Calculus of gallbladder without cholecystitis without obstruction   . Chronic pain    Neck/lower back  . Degenerative disc disease, lumbar   . Diabetic polyneuropathy   . Essential hypertension, benign   . Generalized anxiety disorder   . Glaucoma    Per patient report  . Headache   . 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 Major depressive disorder   . Other spondylosis with radiculopathy, cervical region   . Retinopathy    Right eye; per patient report  . Type 2 diabetes mellitus with hyperglycemia     Past Surgical History:  Procedure Laterality Date  . CHOLECYSTECTOMY N/A 06/02/2018   Procedure: LAPAROSCOPIC CHOLECYSTECTOMY;  Surgeon: Vickie Epley, MD;  Location: [facility] ORS;  Service: General;  Laterality: N/A;  . KNEE ARTHROSCOPY     30 Years ago    Allergies: Hydrocodone and Oxycontin [oxycodone hcl]  Medications: Prior to Admission medications   Medication Sig Start Date End Date Taking? Authorizing  Provider  ALPHA-LIPOIC ACID PO Take by mouth.   Yes [provider]  calcium-vitamin D (OSCAL WITH D) 500-200 MG-UNIT tablet Take 1 tablet by mouth.   Yes [provider]  insulin glargine (LANTUS SOLOSTAR) 100 UNIT/ML Solostar Pen Inject 22 Units into the skin every morning. And pen needles 1/day 05/23/20  Yes Elsie Stain, MD  methocarbamol (ROBAXIN) 500 MG tablet Take 2 tablets (1,000 mg total) by mouth every 6 (six) hours as needed for muscle spasms. 05/23/20  Yes Elsie Stain, MD  Multiple Vitamins-Minerals (MULTIVITAMIN WITH MINERALS) tablet Take 1 tablet by mouth daily.    Yes [provider]  Nutritional Supplements (DHEA PO) Take 100 mg by mouth daily.   Yes [provider]  TRUEPLUS PEN NEEDLES 32G X 4 MM MISC 1 each by Other route daily.  01/07/20  Yes [provider]  zinc gluconate 50 MG tablet Take 50 mg by mouth daily.   Yes [provider]  Accu-Chek FastClix Lancets MISC Use as instructed to check blood sugar up to 3 times daily. Patient taking differently: 1 each by Other route 2 (two) times daily.  06/15/19   Fulp, Cammie, MD  Blood Glucose Monitoring Suppl (ACCU-CHEK GUIDE ME) w/Device KIT 1 kit by Does not apply route 3 (three) times daily. Patient taking differently: 1 kit by Does not apply route 2 (two) times daily.  06/15/19   Fulp, Cammie, MD  dapagliflozin propanediol (FARXIGA) 5 MG TABS tablet Take 1 tablet (5 mg total)  by mouth daily before breakfast. 05/04/20   [name], MD  DULoxetine (CYMBALTA) 60 MG capsule Take 1 capsule (60 mg total) by mouth 2 (two) times daily. 06/09/20 06/09/21  Nevada Crane, MD  glucose blood (ACCU-CHEK GUIDE) test strip Use as instructed to check blood sugar up to 3 times daily. Patient taking differently: 1 each by Other route 2 (two) times daily.  12/16/19   Argentina Donovan, PA-C  imatinib (GLEEVEC) 400 MG tablet Take 1 tablet (400 mg total) by mouth daily. Take with meals and  large glass of water. 07/11/20   Lloyd Huger, MD  lisinopril (ZESTRIL) 20 MG tablet Take 1 tablet (20 mg total) by mouth daily. 05/09/20   Elsie Stain, MD  metFORMIN (GLUCOPHAGE-XR) 500 MG 24 hr tablet Take 2 tablets (1,000 mg total) by mouth 2 (two) times daily with a meal. 02/11/20   Camillia Herter, NP  OVER THE COUNTER MEDICATION Black seed oil 2000per day    [provider]  topiramate (TOPAMAX) 50 MG tablet Take 1 tablet (50 mg total) by mouth 2 (two) times daily. 05/23/20   Elsie Stain, MD     Family History  Problem Relation Age of Onset  . Aneurysm Father   . Multiple sclerosis Sister   . Emphysema Maternal Grandfather   . Diabetes Neg Hx     Social History   Socioeconomic History  . Marital status: Single    Spouse name: Not on file  . Number of children: Not on file  . Years of education: 38  . Highest education level: Master's degree (e.g., MA, MS, MEng, MEd, MSW, MBA)  Occupational History  . Not on file  Tobacco Use  . Smoking status: Former Smoker    Quit date: 06/25/2003    Years since quitting: 17.0  . Smokeless tobacco: Never Used  . Tobacco comment: Currently vapes  Vaping Use  . Vaping Use: Every day  Substance and Sexual Activity  . Alcohol use: Yes    Alcohol/week: 14.0 - 21.0 standard drinks    Types: 14 - 21 Cans of beer per week    Comment: 2-3 beers per night on average; sometimes up to 6  . Drug use: Yes    Types: Marijuana    Comment: 1/week to help sleep; edibles, does not smoke  . Sexual activity: Yes  Other Topics Concern  . Not on file  Social History Narrative  . Not on file   Social Determinants of Health   Financial Resource Strain:   . Difficulty of Paying Living Expenses: Not on file  Food Insecurity:   . Worried About Charity fundraiser in the Last Year: Not on file  . Ran Out of Food in the Last Year: Not on file  Transportation Needs:   . Lack of Transportation (Medical): Not on file  . Lack of  Transportation (Non-Medical): Not on file  Physical Activity:   . Days of Exercise per Week: Not on file  . Minutes of Exercise per Session: Not on file  Stress:   . Feeling of Stress : Not on file  Social Connections:   . Frequency of Communication with Friends and Family: Not on file  . Frequency of Social Gatherings with Friends and Family: Not on file  . Attends Religious Services: Not on file  . Active Member of Clubs or Organizations: Not on file  . Attends Archivist Meetings: Not on file  . Marital Status: Not  on file    Review of Systems: A 12 point ROS discussed and pertinent positives are indicated in the HPI above.  All other systems are negative.  Review of Systems  Constitutional: Negative for fever.  HENT: Negative for congestion.   Respiratory: Negative for cough and shortness of breath.   Cardiovascular: Negative for chest pain.  Gastrointestinal: Negative for abdominal pain.  Musculoskeletal: Positive for arthralgias ( left hip pain related to a fall. ).  Neurological: Negative for headaches.  Psychiatric/Behavioral: Negative for behavioral problems and confusion.    Vital Signs: BP 125/78   Pulse (!) 108   Temp 98.5 F (36.9 C) (Oral)   Resp 20   Ht _0  (1.88 m)   Wt 236 lb (107 kg)   SpO2 96%   BMI 30.30 kg/m   Physical Exam Vitals and nursing note reviewed.  Constitutional:      Appearance: He is well-developed.  HENT:     Head: Normocephalic.  Cardiovascular:     Rate and Rhythm: Regular rhythm. Tachycardia present.     Heart sounds: Normal heart sounds.  Pulmonary:     Effort: Pulmonary effort is normal.     Breath sounds: Normal breath sounds.  Musculoskeletal:        General: Normal range of motion.     Cervical back: Normal range of motion.  Skin:    General: Skin is dry.  Neurological:     Mental Status: He is alert and oriented to person, place, and time.     Imaging: No results found.  Labs:  CBC: Recent Labs     02/11/20 1655 06/14/20 1528 06/27/20 1544 07/18/20 0754  WBC 9.5 11.0* 6.8 9.6  HGB 15.2 14.5 13.6 13.8  HCT 45.8 44.7 40.1 41.3  PLT  --  323 240 286    COAGS: No results for input(s): INR, APTT in the last 8760 hours.  BMP: Recent Labs    09/10/19 1657 09/10/19 1657 10/01/19 1408 01/20/20 1544 03/01/20 1535 06/14/20 1528  NA 139  --  139 133*  --  134  K 4.4  --  5.2 4.7  --  4.1  CL 103  --  99 98  --  98  CO2 21  --  22 20  --  21  GLUCOSE 144*  --  241* 222*  --  114*  BUN 14  --  14 13  --  13  CALCIUM 9.4  --  10.4* 9.3  --  9.2  CREATININE 0.83   < > 0.95 0.90 0.80 0.99  GFRNONAA 100  --  91 97  --  87  GFRAA 116  --  105 112  --  100   < > = values in this interval not displayed.    LIVER FUNCTION TESTS: Recent Labs    09/10/19 1657 10/01/19 1408 10/08/19 1514 06/14/20 1528  BILITOT 0.5 0.5  --  0.7  AST 54* 39  --  47*  ALT 53* 52*  --  38  ALKPHOS 89 88  --  105  PROT 7.2 6.8 6.8 6.8  ALBUMIN 4.3 4.4  --  4.3     Assessment and Plan:  54 y.o. male outpatient. History of DM, HTN, Cirrhosis. Found to have splenomegaly and mesenteric lymphadenopathy BCR ABL positive.Team is requesting a bone marrow biopsy for further evaluation prior to the start of treatment for possible CML. All labs and medications are within acceptable parameters. Allergies include Hydrocodone and Oxycontin. Patient  has been NPO since midnight.  Risks and benefits of bone marrow biopsy was discussed with the patient and/or patient's family including, but not limited to bleeding, infection, damage to adjacent structures or low yield requiring additional tests.  All of the questions were answered and there is agreement to proceed.  Consent signed and in chart.   Thank you for this interesting consult.  I greatly enjoyed meeting Gerald Olson and look forward to participating in their care.  A copy of this report was sent to the requesting provider on this  date.  Electronically Signed: Jacqualine Mau, NP 07/18/2020, 8:27 AM   I spent a total of  30 Minutes   in face to face in clinical consultation, greater than 50% of which was counseling/coordinating care for Bone marrow biopsy

## 2020-07-18 NOTE — Progress Notes (Signed)
Patient clinically stable post BMB per Dr Pascal Lux, tolerated well. Vitals stable pre and post procedure. Denies complaints at this time. bandade dry/intact right sacral area. Awake/alert and oriented post procedure. Received Versed 2 mg along with Fentanyl 100 mcg IV for procedure.report given to [employer] post procedure/specials.

## 2020-07-18 NOTE — Progress Notes (Signed)
Patient, awake/alert x4.  States he hurts all over, neck/back/knee. States he feel Tuesday and made "everything" worst.  Updated patient's mother regarding procedure and CT after. Lungs CTA  Heart sounds regular.

## 2020-07-18 NOTE — Procedures (Signed)
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   Complications: None Immediate EBL: None  Signed: Sandi Mariscal Pager: [phone] 07/18/2020, 9:24 AM

## 2020-07-18 NOTE — Discharge Instructions (Signed)
Website with link to Af[facility] videos and education for patients on [email or web address] Marrow Aspiration and Bone Marrow Biopsy, Adult, Care After This sheet gives you information about how to care for yourself after your procedure. If you have problems or questions, contact your health care provider.  What can I expect after the procedure?  After the procedure, it is common to have:  Mild pain and tenderness.  Swelling.  Bruising.  Follow these instructions at home:  Take over-the-counter or prescription medicines only as told by your health care provider.  You may shower tomorrow ? Remove band aid tomorrow, replace with another bandaid if  site has any drainage from biopsy site. ? Wash your hands with soap and water before you touch your biopsy site  If soap and water are not available, use hand sanitizer. ? Change your dressing frequently for bleeding and/or drainage.  Check your puncture site every day for signs of infection. Check for: ? More redness, swelling, or pain. ? More fluid or blood. ? Warmth. ? Pus or a bad smell.  Return to your normal activities in 24hours.   Do not drive for 24 hours if you were given a medicine to help you relax (sedative).  Keep all follow-up visits as told by your health care provider. This is important. Contact a health care provider if:  You have more redness, swelling, or pain around the puncture site.  You have more fluid or blood coming from the puncture site.  Your puncture site feels warm to the touch.  You have pus or a bad smell coming from the puncture site.  You have a fever.  Your pain is not controlled with medicine. This information is not intended to replace advice given to you by your health care provider. Make sure you discuss any questions you have with your health care provider. Document Released: 03/02/2005 Document Revised: 03/02/2016 Document Reviewed: 01/25/2016 Elsevier Interactive Patient Education   2018 [employer].

## 2020-07-18 NOTE — Progress Notes (Signed)
Completed contrast without event, PIV #20g in RAC.  CT aware of patient, scheduled at 11am, family updated.

## 2020-07-20 NOTE — Progress Notes (Signed)
[place]  [phone]) [phone] Fax:(336) [phone]  ID: [name] OB: Jan 20, 1966  MR#: [id number]  SMO#:[id number]  Patient Care Team: Doreen Beam, FNP as PCP - General (Family Medicine)   CHIEF COMPLAINT: CML.  INTERVAL HISTORY: Patient returns to clinic today for further evaluation and initiation of [place].  He continues have chronic weakness and fatigue, but otherwise feels well.  He has no neurologic complaints. He denies any recent fevers or illnesses. He has a good appetite and denies weight loss. He has no chest pain, shortness of breath, cough, or hemoptysis. He denies any nausea, vomiting, constipation, or diarrhea. He has no melena or hematochezia. He has no urinary complaints.  Patient offers no further specific complaints today.  REVIEW OF SYSTEMS:   Review of Systems  Constitutional: Positive for malaise/fatigue. Negative for fever and weight loss.  Respiratory: Negative.  Negative for cough, hemoptysis and shortness of breath.   Cardiovascular: Negative.  Negative for chest pain and leg swelling.  Gastrointestinal: Negative.  Negative for abdominal pain and blood in stool.  Genitourinary: Negative.  Negative for dysuria, flank pain and hematuria.  Musculoskeletal: Negative.  Negative for back pain.  Skin: Negative.  Negative for rash.  Neurological: Positive for weakness. Negative for dizziness, focal weakness and headaches.  Psychiatric/Behavioral: Negative.  The patient is not nervous/anxious.     As per HPI. Otherwise, a complete review of systems is negative.  PAST MEDICAL HISTORY: Past Medical History:  Diagnosis Date  . ADHD (attention deficit hyperactivity disorder)    Diagnosed during teenage years  . Alcohol use disorder    On average 14-21 drinks per week  . Alcoholic cirrhosis of liver    Per patient - condition is not related to moderate alcohol use but rather prolonged NSAID use and recent abuse.  . Allergy    . Calculus of gallbladder without cholecystitis without obstruction   . Chronic pain    Neck/lower back  . Degenerative disc disease, lumbar   . Diabetic polyneuropathy   . Essential hypertension, benign   . Generalized anxiety disorder   . Glaucoma    Per patient report  . Headache   . 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 Major depressive disorder   . Other spondylosis with radiculopathy, cervical region   . Retinopathy    Right eye; per patient report  . Type 2 diabetes mellitus with hyperglycemia     PAST SURGICAL HISTORY: Past Surgical History:  Procedure Laterality Date  . CHOLECYSTECTOMY N/A 06/02/2018   Procedure: LAPAROSCOPIC CHOLECYSTECTOMY;  Surgeon: Vickie Epley, MD;  Location: [facility] ORS;  Service: General;  Laterality: N/A;  . KNEE ARTHROSCOPY     30 Years ago    FAMILY HISTORY: Family History  Problem Relation Age of Onset  . Aneurysm Father   . Multiple sclerosis Sister   . Emphysema Maternal Grandfather   . Diabetes Neg Hx     ADVANCED DIRECTIVES (Y/N):  N  HEALTH MAINTENANCE: Social History   Tobacco Use  . Smoking status: Former Smoker    Quit date: 06/25/2003    Years since quitting: 17.1  . Smokeless tobacco: Never Used  . Tobacco comment: Currently vapes  Vaping Use  . Vaping Use: Every day  Substance Use Topics  . Alcohol use: Yes    Alcohol/week: 14.0 - 21.0 standard drinks    Types: 14 - 21 Cans of beer per week  Comment: 2-3 beers per night on average; sometimes up to 6  . Drug use: Yes    Types: Marijuana    Comment: 1/week to help sleep; edibles, does not smoke     Colonoscopy:  PAP:  Bone density:  Lipid panel:  Allergies  Allergen Reactions  . Hydrocodone Itching    Itching, face tingling, dizziness.  [name] [Oxycodone Hcl] Itching    Current Outpatient Medications  Medication Sig Dispense Refill  . Accu-Chek FastClix Lancets MISC Use as instructed to  check blood sugar up to 3 times daily. (Patient taking differently: 1 each by Other route 2 (two) times daily. ) 102 each 11  . ALPHA-LIPOIC ACID PO Take by mouth.    . Blood Glucose Monitoring Suppl (ACCU-CHEK GUIDE ME) w/Device KIT 1 kit by Does not apply route 3 (three) times daily. (Patient taking differently: 1 kit by Does not apply route 2 (two) times daily. ) 1 kit 0  . calcium-vitamin D (OSCAL WITH D) 500-200 MG-UNIT tablet Take 1 tablet by mouth.    . dapagliflozin propanediol (FARXIGA) 5 MG TABS tablet Take 1 tablet (5 mg total) by mouth daily before breakfast. 90 tablet 3  . glucose blood (ACCU-CHEK GUIDE) test strip Use as instructed to check blood sugar up to 3 times daily. (Patient taking differently: 1 each by Other route 2 (two) times daily. ) 100 each 12  . imatinib (GLEEVEC) 400 MG tablet Take 1 tablet (400 mg total) by mouth daily. Take with meals and large glass of water. 30 tablet 0  . insulin glargine (LANTUS SOLOSTAR) 100 UNIT/ML Solostar Pen Inject 22 Units into the skin every morning. And pen needles 1/day 15 mL 2  . metFORMIN (GLUCOPHAGE-XR) 500 MG 24 hr tablet Take 2 tablets (1,000 mg total) by mouth 2 (two) times daily with a meal. 120 tablet 2  . Multiple Vitamins-Minerals (MULTIVITAMIN WITH MINERALS) tablet Take 1 tablet by mouth daily.     . Nutritional Supplements (DHEA PO) Take 100 mg by mouth daily.    Marland Kitchen OVER THE COUNTER MEDICATION Black seed oil 2000per day    . topiramate (TOPAMAX) 50 MG tablet Take 1 tablet (50 mg total) by mouth 2 (two) times daily. 60 tablet 5  . TRUEPLUS PEN NEEDLES 32G X 4 MM MISC 1 each by Other route daily.     Marland Kitchen zinc gluconate 50 MG tablet Take 50 mg by mouth daily.    . DULoxetine (CYMBALTA) 60 MG capsule Take 1 capsule (60 mg total) by mouth 2 (two) times daily. (Patient not taking: Reported on 07/28/2020) 60 capsule 1  . lisinopril (ZESTRIL) 20 MG tablet Take 1 tablet (20 mg total) by mouth daily. (Patient not taking: Reported on  07/28/2020) 90 tablet 0  . methocarbamol (ROBAXIN) 500 MG tablet Take 2 tablets (1,000 mg total) by mouth every 6 (six) hours as needed for muscle spasms. (Patient not taking: Reported on 07/28/2020) 90 tablet 0   No current facility-administered medications for this visit.    OBJECTIVE: Vitals:   07/28/20 1014  BP: (!) 157/96  Pulse: (!) 120  Resp: 20  Temp: 98 F (36.7 C)  SpO2: 99%     Body mass index is 30.94 kg/m.    ECOG FS:0 - Asymptomatic  General: Well-developed, well-nourished, no acute distress. Eyes: Pink conjunctiva, anicteric sclera. HEENT: Normocephalic, moist mucous membranes. Lungs: No audible wheezing or coughing. Heart: Regular rate and rhythm. Abdomen: Soft, nontender, no obvious distention. Musculoskeletal: No edema, cyanosis, or  clubbing. Neuro: Alert, answering all questions appropriately. Cranial nerves grossly intact. Skin: No rashes or petechiae noted. Psych: Normal affect.   LAB RESULTS:  Lab Results  Component Value Date   NA 134 06/14/2020   K 4.1 06/14/2020   CL 98 06/14/2020   CO2 21 06/14/2020   GLUCOSE 114 (H) 06/14/2020   BUN 13 06/14/2020   CREATININE 1.10 07/18/2020   CALCIUM 9.2 06/14/2020   PROT 6.8 06/14/2020   ALBUMIN 4.3 06/14/2020   AST 47 (H) 06/14/2020   ALT 38 06/14/2020   ALKPHOS 105 06/14/2020   BILITOT 0.7 06/14/2020   GFRNONAA 87 06/14/2020   GFRAA 100 06/14/2020    Lab Results  Component Value Date   WBC 9.6 07/18/2020   NEUTROABS 6.2 07/18/2020   HGB 13.8 07/18/2020   HCT 41.3 07/18/2020   MCV 85.2 07/18/2020   PLT 286 07/18/2020     STUDIES: CT CHEST ABDOMEN PELVIS W CONTRAST  Result Date: 07/18/2020 CLINICAL DATA:  CML EXAM: CT CHEST, ABDOMEN, AND PELVIS WITH CONTRAST TECHNIQUE: Multidetector CT imaging of the chest, abdomen and pelvis was performed following the standard protocol during bolus administration of intravenous contrast. CONTRAST:  156m OMNIPAQUE IOHEXOL 300 MG/ML SOLN, additional oral  enteric contrast COMPARISON:  CT abdomen pelvis, 03/01/2020 FINDINGS: CT CHEST FINDINGS Cardiovascular: No significant vascular findings. Normal heart size. No pericardial effusion. Mediastinum/Nodes: No enlarged mediastinal, hilar, or axillary lymph nodes. Thyroid gland, trachea, and esophagus demonstrate no significant findings. Lungs/Pleura: Lungs are clear. No pleural effusion or pneumothorax. Musculoskeletal: No chest wall mass or suspicious bone lesions identified. CT ABDOMEN PELVIS FINDINGS Hepatobiliary: Coarse, nodular contour of the liver. No focal liver lesion. Status post cholecystectomy. No biliary ductal dilatation. Pancreas: Unremarkable. No pancreatic ductal dilatation or surrounding inflammatory changes. Spleen: Unchanged splenomegaly, maximum span 17.7 cm. Adrenals/Urinary Tract: Adrenal glands are unremarkable. Kidneys are normal, without renal calculi, solid lesion, or hydronephrosis. Bladder is unremarkable. Stomach/Bowel: Stomach is within normal limits. Appendix appears normal. No evidence of bowel wall thickening, distention, or inflammatory changes. Vascular/Lymphatic: Aortic atherosclerosis. 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 Numerous unchanged enlarged lymph nodes of the small bowel mesentery, an index nodule in the left hemiabdomen measuring 2.0 x 1.0 cm (series 2, image 82). There has been interval resolution of previously seen fat stranding. Reproductive: No mass or other abnormality. Other: No abdominal wall hernia or abnormality. No abdominopelvic ascites. Musculoskeletal: No acute or significant osseous findings. IMPRESSION: 1. Numerous unchanged enlarged lymph nodes of the small bowel mesentery. No evidence of new mass or lymphadenopathy in the chest, abdomen, or pelvis. 2. Unchanged splenomegaly. 3. Coarse, nodular contour of the liver, suggestive of cirrhosis. No focal liver lesion. 4. 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 Aortic Atherosclerosis (ICD10-I70.0). Electronically Signed   By: AEddie CandleM.D.   On: 07/18/2020 15:04   CT BONE MARROW BIOPSY  Result Date: 07/18/2020 INDICATION: Lymphadenopathy. Concern for CML. Please perform CT-guided bone marrow biopsy for tissue diagnostic purposes. EXAM: CT-GUIDED BONE MARROW BIOPSY AND ASPIRATION MEDICATIONS: None ANESTHESIA/SEDATION: Fentanyl 100 mcg IV; Versed 2 mg IV Sedation Time: 10 Minutes; The patient was continuously monitored during the procedure by the interventional radiology nurse under my direct supervision. COMPLICATIONS: None immediate. PROCEDURE: Informed consent was obtained from the patient following an explanation of the procedure, risks, benefits and alternatives. The patient understands, agrees and consents for  the procedure. All questions were addressed. A time out was performed prior to the initiation of the procedure. The patient was positioned prone and non-contrast localization CT was performed of the pelvis to demonstrate the iliac marrow spaces. The operative site was prepped and draped in the usual sterile fashion. Under sterile conditions and local anesthesia, a 22 gauge spinal needle was utilized for procedural planning. Next, an 11 gauge coaxial bone biopsy needle was advanced into the left iliac marrow space. Needle position was confirmed with CT imaging. Initially, a bone marrow aspiration was performed. Next, a bone marrow biopsy was obtained with the 11 gauge outer bone marrow device. The needle was removed and superficial hemostasis was obtained with manual compression. A dressing was applied. The patient tolerated the procedure well without immediate post procedural complication. IMPRESSION: Successful CT guided left iliac bone marrow aspiration and core biopsy. Electronically Signed   By: Sandi Mariscal M.D.   On: 07/18/2020 11:11    ASSESSMENT: CML PLAN:     1.  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  Repeat CT scan results from July 18, 2020 reviewed independently and reported as above with only mild abdominal lymphadenopathy and mild splenomegaly that are chronic and unchanged.  Bone marrow biopsy was negative for disease.  Proceed with 400 mg Gleevec.  Return to clinic in 6 weeks for repeat laboratory work, further evaluation, and to assess his toleration of [place].  Appreciate clinical pharmacy input. 2. Cognitive complaints: MRI of the brain did not reveal any anatomic or structural abnormalities. 3. Fatigue: Multifactorial, unclear if underlying CML is related given the minimal disease burden. 4. Iron deficiency without anemia: Recommended dietary changes. 5. Splenomegaly/mesenteric lymphadenopathy: Chronic and unchanged.  Unclear if this is related to CML.  Monitor.   Patient expressed understanding and was in agreement with this plan. He also understands that He can call clinic at any time with any questions, concerns, or complaints.    Lloyd Huger, MD   07/29/2020 6:32 AM

## 2020-07-25 MED FILL — IMATINIB MESYLATE 400 MG TA: 400 | 30 days supply | Qty: 30 | Fill #0

## 2020-07-26 ENCOUNTER — Ambulatory Visit: Payer: Medicaid Other | Admitting: Oncology

## 2020-07-27 ENCOUNTER — Encounter (HOSPITAL_COMMUNITY): Payer: Self-pay | Admitting: Oncology

## 2020-07-28 ENCOUNTER — Inpatient Hospital Stay: Payer: Medicaid Other | Admitting: Pharmacist

## 2020-07-28 ENCOUNTER — Ambulatory Visit: Payer: Medicaid Other | Attending: Adult Health

## 2020-07-28 ENCOUNTER — Other Ambulatory Visit: Payer: Self-pay

## 2020-07-28 ENCOUNTER — Inpatient Hospital Stay: Payer: Medicaid Other | Attending: Oncology | Admitting: Oncology

## 2020-07-28 ENCOUNTER — Encounter: Payer: Self-pay | Admitting: Oncology

## 2020-07-28 VITALS — BP 157/96 | HR 120 | Temp 98.0°F | Resp 20 | Wt 241.0 lb

## 2020-07-28 DIAGNOSIS — Z87891 Personal history of nicotine dependence: Secondary | ICD-10-CM | POA: Diagnosis not present

## 2020-07-28 DIAGNOSIS — Z79899 Other long term (current) drug therapy: Secondary | ICD-10-CM | POA: Diagnosis not present

## 2020-07-28 DIAGNOSIS — I10 Essential (primary) hypertension: Secondary | ICD-10-CM | POA: Diagnosis not present

## 2020-07-28 DIAGNOSIS — E1142 Type 2 diabetes mellitus with diabetic polyneuropathy: Secondary | ICD-10-CM | POA: Diagnosis not present

## 2020-07-28 DIAGNOSIS — M5442 Lumbago with sciatica, left side: Secondary | ICD-10-CM | POA: Insufficient documentation

## 2020-07-28 DIAGNOSIS — R161 Splenomegaly, not elsewhere classified: Secondary | ICD-10-CM | POA: Diagnosis not present

## 2020-07-28 DIAGNOSIS — R262 Difficulty in walking, not elsewhere classified: Secondary | ICD-10-CM | POA: Diagnosis not present

## 2020-07-28 DIAGNOSIS — R5381 Other malaise: Secondary | ICD-10-CM | POA: Diagnosis not present

## 2020-07-28 DIAGNOSIS — G8929 Other chronic pain: Secondary | ICD-10-CM | POA: Diagnosis not present

## 2020-07-28 DIAGNOSIS — M542 Cervicalgia: Secondary | ICD-10-CM | POA: Insufficient documentation

## 2020-07-28 DIAGNOSIS — R531 Weakness: Secondary | ICD-10-CM | POA: Insufficient documentation

## 2020-07-28 DIAGNOSIS — Z794 Long term (current) use of insulin: Secondary | ICD-10-CM | POA: Diagnosis not present

## 2020-07-28 DIAGNOSIS — M6281 Muscle weakness (generalized): Secondary | ICD-10-CM | POA: Diagnosis not present

## 2020-07-28 DIAGNOSIS — R5383 Other fatigue: Secondary | ICD-10-CM | POA: Insufficient documentation

## 2020-07-28 DIAGNOSIS — K7030 Alcoholic cirrhosis of liver without ascites: Secondary | ICD-10-CM | POA: Insufficient documentation

## 2020-07-28 DIAGNOSIS — C9210 Chronic myeloid leukemia, BCR/ABL-positive, not having achieved remission: Secondary | ICD-10-CM | POA: Insufficient documentation

## 2020-07-28 NOTE — Therapy (Addendum)
[place], [place], [address] Phone: [phone]   Fax:  [phone]  Physical Therapy Evaluation  Patient Details  Name: Gerald Olson MRN: [id number] Date of Birth: 05-31-1966 Referring Provider (PT): Marybelle Killings, MD    Encounter Date: 07/28/2020   PT End of Session - 07/28/20 1532    Visit Number 1    Number of Visits 13    Date for PT Re-Evaluation 09/10/20    Authorization Type Medicaid - [employer]    PT Start Time 1533    PT Stop Time 1619    PT Time Calculation (min) 46 min    Activity Tolerance Patient tolerated treatment well    Behavior During Therapy [facility] for tasks assessed/performed           Past Medical History:  Diagnosis Date  . ADHD (attention deficit hyperactivity disorder)    Diagnosed during teenage years  . Alcohol use disorder    On average 14-21 drinks per week  . Alcoholic cirrhosis of liver    Per patient - condition is not related to moderate alcohol use but rather prolonged NSAID use and recent abuse.  . Allergy   . Calculus of gallbladder without cholecystitis without obstruction   . Chronic pain    Neck/lower back  . Degenerative disc disease, lumbar   . Diabetic polyneuropathy   . Essential hypertension, benign   . Generalized anxiety disorder   . Glaucoma    Per patient report  . Headache   . 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 Major depressive disorder   . Other spondylosis with radiculopathy, cervical region   . Retinopathy    Right eye; per patient report  . Type 2 diabetes mellitus with hyperglycemia     Past Surgical History:  Procedure Laterality Date  . CHOLECYSTECTOMY N/A 06/02/2018   Procedure: LAPAROSCOPIC CHOLECYSTECTOMY;  Surgeon: Vickie Epley, MD;  Location: [facility] ORS;  Service: General;  Laterality: N/A;  . KNEE ARTHROSCOPY     30 Years ago    There were no vitals filed for  this visit.    Subjective Assessment - 07/28/20 1539    Subjective "The neck I've been able to manage a little bit changing my pillow arrangement. Before, I was waking up every hour with pain going to the middle back. The low back started this whole thing. Pt has had flare ups for 5-6 years that used to ease with prednisone but hasn't eased lately. Originally the right leg was hurting with sciatic pain. and it shifted to the left leg about 3 years ago. I've had 27 falls since April 2019 with the first one being back into the tub. When I step with my left leg and turn to the left, it feels like someone is tasing me. The last fall was 3 weeks ago getting out of the truck - I was in kind of in a hurry, got that tasing feeling, and 'splat' then was able to roll over and do self-traction after 10 minutes. Another fall that really set the neck off is when I was holding groceries with both hands and faceplanted into an iron rail."    Limitations Standing;Walking;House hold activities;Sitting    How long can you sit comfortably? "15-30 minutes before getting up, walking, stretching"    How long can you stand comfortably? "There's physical pain then also fatigue standing for a while -  I would say 20 minutes" - takes seated rest breaks while cooking    How long can you walk comfortably? "About 20 minutes at the most"    Patient Stated Goals Reducing falls/not falling, being able to do daily activities    Currently in Pain? Yes    Pain Score 10-Worst pain ever    Pain Location Back    Pain Orientation Lower    Pain Descriptors / Indicators Constant;Burning;Tightness    Pain Type Chronic pain    Pain Radiating Towards Numbness/tingling down LLE to toes; sharp stabbing pain while walking    Pain Frequency Constant    Aggravating Factors  Quick movements; stepping with LLE and turning L    Pain Relieving Factors Taken fentanyl twice that has helped    Effect of Pain on Daily Activities Several falls and  limited mobility              [facility] PT Assessment - 07/28/20 0001      Assessment   Medical Diagnosis Spinal stenosis of cervical region M48.02, Foraminal stenosis of cervical region M48.02, Lumbar foraminal stenosis M48.061    Referring Provider (PT) Marybelle Killings, MD     Onset Date/Surgical Date --   "over 3 years"   Hand Dominance Right    Next MD Visit "In 2 weeks, I think"    Prior Therapy Not for this      Precautions   Precautions None      Restrictions   Weight Bearing Restrictions No      Balance Screen   Has the patient fallen in the past 6 months Yes    How many times? 5   2 bad falls   Has the patient had a decrease in activity level because of a fear of falling?  Yes    Is the patient reluctant to leave their home because of a fear of falling?  Yes      [place] residence    Living Arrangements Alone    Available Help at Discharge Other (Comment)   None; 1 neighbor check in sometimes   Type of [place] to enter    Entrance Stairs-Number of Steps 3    Entrance Stairs-Rails Can reach both    [place] One level    [place] - single point;Walker - 2 wheels   tried RW but it's too short and not comfortable   Additional Comments "I haven't had a fall walking with a cane"      Prior Function   Level of Independence Independent      Cognition   Overall Cognitive Status Within Functional Limits for tasks assessed      Sensation   Additional Comments Limited tests and measures during evaluation due to significant symptom exacerbation throughout evaluation and limited tolerance to movements.      ROM / Strength   AROM / PROM / Strength AROM;Strength      AROM   Overall AROM Comments Very slow antalgic movements    AROM Assessment Site Lumbar    Lumbar Flexion Very slow movement into lumbar flexion - left hand reached ~4 inches above superior pole of patella with pt using SPC in R hand  for support    Lumbar Extension Painful to attempt    Lumbar - Right Side Bend ~3 inches above lateral R knee joint line    Lumbar - Left Side Bend ~3  inches above lateral knee joint line    Lumbar - Right Rotation < 50%    Lumbar - Left Rotation < 50%      Strength   Overall Strength Comments LLE grossly 3+/5 with visible tremors due to weakness during MMT; RLE grossly 4/5    Strength Assessment Site Hip;Knee;Ankle      Ambulation/Gait   Ambulation/Gait Yes    Ambulation/Gait Assistance 6: Modified independent (Device/Increase time)    Ambulation Distance (Feet) 100 Feet    Assistive device Straight cane    Ambulation Surface Level;Indoor    Gait Comments Antalgic and abnormal gait due to LBP and LLE radicular symptoms while ambulating to/from therapy gym                      Objective measurements completed on examination: See above findings.       [facility] PT Treatment/Exercise - 07/28/20 0001      Self-Care   Self-Care Other Self-Care Comments    Other Self-Care Comments  Patient education regarding anatomy of condition, POC, and use of RW for increased stability and safety to reduce fall risk. Patient states his house is crammed and there's not enough room for him to safely use RW (he reports trying in the past) so PT advised using it outside of the home for safety. Pt reports he has not had any falls using SPC as long as he is careful when turning left.                  PT Education - 07/28/20 2011    Education Details Patient education regarding anatomy of condition, POC, and use of RW for increased stability and safety to reduce fall risk. Patient states his house is crammed and there's not enough room for him to safely use RW (he reports trying in the past) so PT advised using it outside of the home for safety. Pt reports he has not had any falls using SPC as long as he is careful when turning left.    Person(s) Educated Patient    Methods  Explanation;Demonstration;Tactile cues;Verbal cues    Comprehension Verbalized understanding;Returned demonstration;Verbal cues required;Tactile cues required            PT Short Term Goals - 07/28/20 2003      PT SHORT TERM GOAL #1   Title Pt will be independent with initial HEP.    Baseline Pt provided initial HEP at evaluation 07/28/2020.    Time 3    Period Weeks    Status New    Target Date 08/18/20      PT SHORT TERM GOAL #2   Title Patient will report no falls for at least 1 consecutive week.    Baseline Pt falls and trips frequently due to symptoms in back and LLE.    Time 3    Period Weeks    Status New    Target Date 08/18/20      PT SHORT TERM GOAL #3   Title Patient's LLE MMT will improve to at least 4/5 grossly.    Baseline 3+/5    Time 3    Period Weeks    Status New    Target Date 08/18/20      PT SHORT TERM GOAL #4   Title Pt will be able to ambulate 50 feet using SPC including at least 1 left turn without falling or LOB.    Baseline Pt has exacerbation of symptoms with  turning/rotating left, which is when he typically falls.    Time 3    Period Weeks    Status New    Target Date 08/18/20             PT Long Term Goals - 07/28/20 2007      PT LONG TERM GOAL #1   Title Pt will be independent with advanced HEP.    Baseline Pt provided initial HEP at evaluation 07/28/2020.    Time 6    Period Weeks    Status New    Target Date 09/08/20      PT LONG TERM GOAL #2   Title Patient will report no falls for at least 3 consecutive weeks.    Baseline Pt falls and trips frequently due to symptoms in back and LLE.    Time 6    Period Weeks    Status New    Target Date 09/08/20      PT LONG TERM GOAL #3   Title Patient's LLE MMT will improve to at least 4+/5 grossly.    Baseline 3+/5    Time 6    Period Weeks    Status New    Target Date 09/08/20      PT LONG TERM GOAL #4   Title Pt will be able to ambulate 150 feet using SPC including at least  2 left turns without falling or LOB.    Baseline Pt has exacerbation of symptoms with turning/rotating left, which is when he typically falls.    Time 6    Period Weeks    Status New    Target Date 09/08/20                  Plan - 07/28/20 1952    Clinical Impression Statement Patient is a 54 year old male who presents to OPPT with complaints of neck pain and low back pain with radicular symptoms in LLE that severely limit his mobility. Limited tests and measures during evaluation today due to significant exacerbation of symptoms with slow transfers and decreased tolerance to various positions. Pt required several minutes to stretch his back (performed R LTR stretch) following prone > supine repositioning due to significant pain. Muscle energy technique attempted after long sit test revealed posterior rotation of L innominate but pt experienced cramping and radicular symptoms in LLE after initial repetition, so no additional reps were performed. Pt had good tolerance to piriformis stretch to ease cramping in LLE. He demonstrates decreased LLE strength compared to RLE with noticeable tremors during MMT due to fatigue. Pt also presents with tremors in LUE that has been present for "a while". Patient should benefit from skilled PT intervention to reduce LBP and LLE radicular symptoms, increase strength, and reduce fall risk to allow for safer household/community ambulation and improved tolerance with functional activities.    Personal Factors and Comorbidities Age;Time since onset of injury/illness/exacerbation;Past/Current Experience;Comorbidity 1;Comorbidity 3+;Comorbidity 2    Comorbidities see extensive PMH above    Examination-Activity Limitations Bathing;Bed Mobility;Bend;Carry;Dressing;Lift;Locomotion Level;Reach Overhead;Sit;Sleep;Squat;Stairs;Stand;Transfers    Examination-Participation Restrictions Cleaning;Community Activity;Driving;Meal Prep;Shop    Stability/Clinical Decision Making  Evolving/Moderate complexity    Clinical Decision Making Moderate    Rehab Potential Fair    PT Frequency 2x / week    PT Duration 6 weeks    PT Treatment/Interventions ADLs/Self Care Home Management;Aquatic Therapy;Cryotherapy;Electrical Stimulation;Iontophoresis 4mg /ml Dexamethasone;Moist Heat;Traction;Neuromuscular re-education;Balance training;Therapeutic exercise;Therapeutic activities;Functional mobility training;Stair training;Gait training;Patient/family education;Energy conservation;Manual techniques;Dry needling;Passive range of motion;Taping    PT  Next Visit Plan Update medication. Reassess long sit test and address innominate rotation if present, consider manual traction/LAD, manual therapy (PAIVM to facilitate FL), consider sciatic nerve glides if tolerated, core strengthening, piriformis stretch, gait training with RW    PT Home Exercise Plan Create next session    Consulted and Agree with Plan of Care Patient           Patient will benefit from skilled therapeutic intervention in order to improve the following deficits and impairments:  Difficulty walking, Abnormal gait, Decreased activity tolerance, Decreased balance, Decreased mobility, Decreased strength, Impaired sensation, Pain, Increased muscle spasms, Decreased endurance, Decreased range of motion, Improper body mechanics, Postural dysfunction  Visit Diagnosis: Chronic left-sided low back pain with left-sided sciatica  Difficulty in walking, not elsewhere classified  Muscle weakness (generalized)  Cervicalgia     Problem List Patient Active Problem List   Diagnosis Date Noted  . Occult blood in stools 06/15/2020  . Mesenteric lymphadenopathy 06/14/2020  . Splenomegaly- mild  06/14/2020  . Screening for blood or protein in urine 06/14/2020  . Elevated blood pressure reading 06/14/2020  . History of cannabis abuse 06/14/2020  . Recurrent major depressive disorder, in partial remission ([place]) 06/09/2020  .  Anxiety 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 06/09/2020  . Alcohol use disorder, moderate, dependence ([place]) 06/09/2020  . Spinal stenosis of cervical region 06/03/2020  . Frequent falls 05/23/2020  . Foraminal stenosis of cervical region 05/09/2020  . Diabetic polyneuropathy   . ADHD (attention deficit hyperactivity disorder)   . Generalized anxiety disorder   . Major depressive disorder   . Other spondylosis with radiculopathy, cervical region 09/22/2019  . Low back pain 07/28/2019  . Essential hypertension, benign 01/28/2019  . Alcoholic cirrhosis of liver 06/02/2018  . Type 2 diabetes mellitus with hyperglycemia 04/25/2018  . Alcohol use disorder, mild, abuse 04/25/2018    Check all possible CPT codes: 44920- Therapeutic Exercise, [phone]- Neuro Re-education, [phone] - Gait Training, [phone] - Manual Therapy, [phone] - Therapeutic Activities, [phone] - Self Care, [phone] - Electrical stimulation (Manual), [id number] - Iontophoresis, [id number] - Ultrasound, [id number] - Physical performance training and [id number] - Aquatic therapy         Haydee Monica, PT, DPT 07/28/20 8:24 PM  [facility] [address] [place], [place], [address] Phone: [phone]   Fax:  [phone]  Name: Gerald Olson MRN: [id number] Date of Birth: 11-02-1965

## 2020-07-28 NOTE — Progress Notes (Signed)
[place]  [phone] [phone]  Patient Care Team: Doreen Beam, FNP as PCP - General (Family Medicine)   Name of the patient: Gerald Olson  [id number]  06-27-1966   Date of visit: 07/28/20  HPI: Patient is a 54 y.o. male with newly diagnosed CML  Reason for Consult: Gleevec (imatinib) oral chemotherapy education.   PAST MEDICAL HISTORY: Past Medical History:  Diagnosis Date  . ADHD (attention deficit hyperactivity disorder)    Diagnosed during teenage years  . Alcohol use disorder    On average 14-21 drinks per week  . Alcoholic cirrhosis of liver    Per patient - condition is not related to moderate alcohol use but rather prolonged NSAID use and recent abuse.  . Allergy   . Calculus of gallbladder without cholecystitis without obstruction   . Chronic pain    Neck/lower back  . Degenerative disc disease, lumbar   . Diabetic polyneuropathy   . Essential hypertension, benign   . Generalized anxiety disorder   . Glaucoma    Per patient report  . Headache   . 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 Major depressive disorder   . Other spondylosis with radiculopathy, cervical region   . Retinopathy    Right eye; per patient report  . Type 2 diabetes mellitus with hyperglycemia     PAST SURGICAL HISTORY:  Past Surgical History:  Procedure Laterality Date  . CHOLECYSTECTOMY N/A 06/02/2018   Procedure: LAPAROSCOPIC CHOLECYSTECTOMY;  Surgeon: Vickie Epley, MD;  Location: [facility] ORS;  Service: General;  Laterality: N/A;  . KNEE ARTHROSCOPY     30 Years ago    HEMATOLOGY/ONCOLOGY HISTORY:  Oncology History   No history exists.    ALLERGIES:  is allergic to hydrocodone and oxycontin [oxycodone hcl].  MEDICATIONS:  Current Outpatient Medications  Medication Sig Dispense Refill  . Accu-Chek FastClix Lancets MISC Use as instructed to  check blood sugar up to 3 times daily. (Patient taking differently: 1 each by Other route 2 (two) times daily. ) 102 each 11  . ALPHA-LIPOIC ACID PO Take by mouth.    . Blood Glucose Monitoring Suppl (ACCU-CHEK GUIDE ME) w/Device KIT 1 kit by Does not apply route 3 (three) times daily. (Patient taking differently: 1 kit by Does not apply route 2 (two) times daily. ) 1 kit 0  . calcium-vitamin D (OSCAL WITH D) 500-200 MG-UNIT tablet Take 1 tablet by mouth.    . dapagliflozin propanediol (FARXIGA) 5 MG TABS tablet Take 1 tablet (5 mg total) by mouth daily before breakfast. 90 tablet 3  . DULoxetine (CYMBALTA) 60 MG capsule Take 1 capsule (60 mg total) by mouth 2 (two) times daily. (Patient not taking: Reported on 07/28/2020) 60 capsule 1  . glucose blood (ACCU-CHEK GUIDE) test strip Use as instructed to check blood sugar up to 3 times daily. (Patient taking differently: 1 each by Other route 2 (two) times daily. ) 100 each 12  . imatinib (GLEEVEC) 400 MG tablet Take 1 tablet (400 mg total) by mouth daily. Take with meals and large glass of water. 30 tablet 0  . insulin glargine (LANTUS SOLOSTAR) 100 UNIT/ML Solostar Pen Inject 22 Units into the skin every morning. And pen needles 1/day 15 mL 2  . lisinopril (ZESTRIL) 20 MG tablet Take 1 tablet (20 mg total) by mouth daily. (Patient not taking: Reported on 07/28/2020) 90 tablet 0  .  metFORMIN (GLUCOPHAGE-XR) 500 MG 24 hr tablet Take 2 tablets (1,000 mg total) by mouth 2 (two) times daily with a meal. 120 tablet 2  . methocarbamol (ROBAXIN) 500 MG tablet Take 2 tablets (1,000 mg total) by mouth every 6 (six) hours as needed for muscle spasms. (Patient not taking: Reported on 07/28/2020) 90 tablet 0  . Multiple Vitamins-Minerals (MULTIVITAMIN WITH MINERALS) tablet Take 1 tablet by mouth daily.     . Nutritional Supplements (DHEA PO) Take 100 mg by mouth daily.    Marland Kitchen OVER THE COUNTER MEDICATION Black seed oil 2000per day    . topiramate (TOPAMAX) 50 MG tablet  Take 1 tablet (50 mg total) by mouth 2 (two) times daily. 60 tablet 5  . TRUEPLUS PEN NEEDLES 32G X 4 MM MISC 1 each by Other route daily.     Marland Kitchen zinc gluconate 50 MG tablet Take 50 mg by mouth daily.     No current facility-administered medications for this visit.    VITAL SIGNS: There were no vitals taken for this visit. There were no vitals filed for this visit.  Estimated body mass index is 30.94 kg/m as calculated from the following:   Height as of 07/18/20: $RemoveBefo'6\' 2"'QTmTEMIHWDx$  (1.88 m).   Weight as of an earlier encounter on 07/28/20: 109.3 kg (241 lb).  LABS: CBC:    Component Value Date/Time   WBC 9.6 07/18/2020 0754   HGB 13.8 07/18/2020 0754   HGB 14.5 06/14/2020 1528   HCT 41.3 07/18/2020 0754   HCT 44.7 06/14/2020 1528   PLT 286 07/18/2020 0754   PLT 323 06/14/2020 1528   MCV 85.2 07/18/2020 0754   MCV 87 06/14/2020 1528   NEUTROABS 6.2 07/18/2020 0754   NEUTROABS 7.8 (H) 06/14/2020 1528   LYMPHSABS 2.3 07/18/2020 0754   LYMPHSABS 2.2 06/14/2020 1528   MONOABS 0.9 07/18/2020 0754   EOSABS 0.2 07/18/2020 0754   EOSABS 0.1 06/14/2020 1528   BASOSABS 0.1 07/18/2020 0754   BASOSABS 0.1 06/14/2020 1528   Comprehensive Metabolic Panel:    Component Value Date/Time   NA 134 06/14/2020 1528   K 4.1 06/14/2020 1528   CL 98 06/14/2020 1528   CO2 21 06/14/2020 1528   BUN 13 06/14/2020 1528   CREATININE 1.10 07/18/2020 0928   CREATININE 0.80 01/12/2019 1452   GLUCOSE 114 (H) 06/14/2020 1528   GLUCOSE 308 (H) 01/12/2019 1452   CALCIUM 9.2 06/14/2020 1528   AST 47 (H) 06/14/2020 1528   ALT 38 06/14/2020 1528   ALKPHOS 105 06/14/2020 1528   BILITOT 0.7 06/14/2020 1528   PROT 6.8 06/14/2020 1528   ALBUMIN 4.3 06/14/2020 1528    RADIOGRAPHIC STUDIES: CT CHEST ABDOMEN PELVIS W CONTRAST  Result Date: 07/18/2020 CLINICAL DATA:  CML EXAM: CT CHEST, ABDOMEN, AND PELVIS WITH CONTRAST TECHNIQUE: Multidetector CT imaging of the chest, abdomen and pelvis was performed following the  standard protocol during bolus administration of intravenous contrast. CONTRAST:  132mL OMNIPAQUE IOHEXOL 300 MG/ML SOLN, additional oral enteric contrast COMPARISON:  CT abdomen pelvis, 03/01/2020 FINDINGS: CT CHEST FINDINGS Cardiovascular: No significant vascular findings. Normal heart size. No pericardial effusion. Mediastinum/Nodes: No enlarged mediastinal, hilar, or axillary lymph nodes. Thyroid gland, trachea, and esophagus demonstrate no significant findings. Lungs/Pleura: Lungs are clear. No pleural effusion or pneumothorax. Musculoskeletal: No chest wall mass or suspicious bone lesions identified. CT ABDOMEN PELVIS FINDINGS Hepatobiliary: Coarse, nodular contour of the liver. No focal liver lesion. Status post cholecystectomy. No biliary ductal dilatation. Pancreas: Unremarkable. No pancreatic  ductal dilatation or surrounding inflammatory changes. Spleen: Unchanged splenomegaly, maximum span 17.7 cm. Adrenals/Urinary Tract: Adrenal glands are unremarkable. Kidneys are normal, without renal calculi, solid lesion, or hydronephrosis. Bladder is unremarkable. Stomach/Bowel: Stomach is within normal limits. Appendix appears normal. No evidence of bowel wall thickening, distention, or inflammatory changes. Vascular/Lymphatic: Aortic atherosclerosis. 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 Numerous unchanged enlarged lymph nodes of the small bowel mesentery, an index nodule in the left hemiabdomen measuring 2.0 x 1.0 cm (series 2, image 82). There has been interval resolution of previously seen fat stranding. Reproductive: No mass or other abnormality. Other: No abdominal wall hernia or abnormality. No abdominopelvic ascites. Musculoskeletal: No acute or significant osseous findings. IMPRESSION: 1. Numerous unchanged enlarged lymph nodes of the small bowel mesentery. No evidence of new mass or lymphadenopathy in the chest, abdomen, or  pelvis. 2. Unchanged splenomegaly. 3. Coarse, nodular contour of the liver, suggestive of cirrhosis. No focal liver lesion. 4. 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 Aortic Atherosclerosis (ICD10-I70.0). Electronically Signed   By: Eddie Candle M.D.   On: 07/18/2020 15:04   CT BONE MARROW BIOPSY  Result Date: 07/18/2020 INDICATION: Lymphadenopathy. Concern for CML. Please perform CT-guided bone marrow biopsy for tissue diagnostic purposes. EXAM: CT-GUIDED BONE MARROW BIOPSY AND ASPIRATION MEDICATIONS: None ANESTHESIA/SEDATION: Fentanyl 100 mcg IV; Versed 2 mg IV Sedation Time: 10 Minutes; The patient was continuously monitored during the procedure by the interventional radiology nurse under my direct supervision. COMPLICATIONS: None immediate. PROCEDURE: Informed consent was obtained from the patient following an explanation of the procedure, risks, benefits and alternatives. The patient understands, agrees and consents for the procedure. All questions were addressed. A time out was performed prior to the initiation of the procedure. The patient was positioned prone and non-contrast localization CT was performed of the pelvis to demonstrate the iliac marrow spaces. The operative site was prepped and draped in the usual sterile fashion. Under sterile conditions and local anesthesia, a 22 gauge spinal needle was utilized for procedural planning. Next, an 11 gauge coaxial bone biopsy needle was advanced into the left iliac marrow space. Needle position was confirmed with CT imaging. Initially, a bone marrow aspiration was performed. Next, a bone marrow biopsy was obtained with the 11 gauge outer bone marrow device. The needle was removed and superficial hemostasis was obtained with manual compression. A dressing was applied. The patient tolerated the procedure well without immediate post procedural complication. IMPRESSION: Successful CT guided left iliac bone  marrow aspiration and core biopsy. Electronically Signed   By: Sandi Mariscal M.D.   On: 07/18/2020 11:11     Assessment and Plan-  Start taking imatinib $RemoveBeforeD'400mg'[id number]$  today.   Patient Education I spoke with patient for overview of new oral chemotherapy medication: Gleevec (imatinib) for the treatment of newly diagnosed Chronic Myeloid Leukemia, planned duration until disease progression or toxicity.   Counseled patient on administration, dosing, side effects, monitoring, drug-food interactions, safe handling, storage, and disposal. Patient will take 1 tablet (400 mg total) by mouth daily. Take with meals and large glass of water.  Side effects include but not limited to: diarrhea, rash, N/V, fatigue, decreased wbc/hgb/plt, edema.    Reviewed with patient importance of keeping a medication schedule and plan for any missed doses.  After discussion with patient no patient barriers to medication adherence identified.   Mr. Fariss voiced understanding  and appreciation. All questions answered. Medication and CML handout provided.  Provided patient with [facility] phone number. Patient knows to call the office with questions or concerns. [facility] will continue to follow.  Medication Access Issues: no issues, medication filled at [facility].  Patient expressed understanding and was in agreement with this plan. He also understands that He can call clinic at any time with any questions, concerns, or complaints.   Thank you for allowing me to participate in the care of this very pleasant patient.   Time Total: 30 mins  Visit consisted of counseling and education on dealing with issues of symptom management in the setting of serious and potentially life-threatening illness.Greater than 50%  of this time was spent counseling and coordinating care related to the above assessment and plan.  Signed by: [name], PharmD, BCPS, Salley Slaughter, CPP  Hematology/Oncology Clinical Pharmacist Practitioner [facility] [phone]  07/28/2020 11:52 AM

## 2020-08-01 ENCOUNTER — Other Ambulatory Visit: Payer: Self-pay

## 2020-08-01 ENCOUNTER — Ambulatory Visit: Payer: Medicaid Other

## 2020-08-01 DIAGNOSIS — M5442 Lumbago with sciatica, left side: Secondary | ICD-10-CM

## 2020-08-01 DIAGNOSIS — R262 Difficulty in walking, not elsewhere classified: Secondary | ICD-10-CM

## 2020-08-01 DIAGNOSIS — G8929 Other chronic pain: Secondary | ICD-10-CM

## 2020-08-01 DIAGNOSIS — M542 Cervicalgia: Secondary | ICD-10-CM | POA: Diagnosis not present

## 2020-08-01 DIAGNOSIS — M6281 Muscle weakness (generalized): Secondary | ICD-10-CM

## 2020-08-01 LAB — SURGICAL PATHOLOGY

## 2020-08-01 NOTE — Therapy (Signed)
[place], [place], [address] Phone: [phone]   Fax:  [phone]  Physical Therapy Treatment  Patient Details  Name: Gerald Olson MRN: [id number] Date of Birth: Oct 17, 1965 Referring Provider (PT): Marybelle Killings, MD    Encounter Date: 08/01/2020   PT End of Session - 08/01/20 1643    Visit Number 2    Number of Visits 13    Date for PT Re-Evaluation 09/10/20    Authorization Type Medicaid - [employer]    Authorization Time Period authorization pending    PT Start Time 7672    PT Stop Time 1630    PT Time Calculation (min) 45 min    Activity Tolerance Patient limited by pain;Patient tolerated treatment well    Behavior During Therapy [facility] for tasks assessed/performed;Impulsive           Past Medical History:  Diagnosis Date  . ADHD (attention deficit hyperactivity disorder)    Diagnosed during teenage years  . Alcohol use disorder    On average 14-21 drinks per week  . Alcoholic cirrhosis of liver    Per patient - condition is not related to moderate alcohol use but rather prolonged NSAID use and recent abuse.  . Allergy   . Calculus of gallbladder without cholecystitis without obstruction   . Chronic pain    Neck/lower back  . Degenerative disc disease, lumbar   . Diabetic polyneuropathy   . Essential hypertension, benign   . Generalized anxiety disorder   . Glaucoma    Per patient report  . Headache   . 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 Major depressive disorder   . Other spondylosis with radiculopathy, cervical region   . Retinopathy    Right eye; per patient report  . Type 2 diabetes mellitus with hyperglycemia     Past Surgical History:  Procedure Laterality Date  . CHOLECYSTECTOMY N/A 06/02/2018   Procedure: LAPAROSCOPIC CHOLECYSTECTOMY;  Surgeon: Vickie Epley, MD;  Location: [facility] ORS;  Service: General;   Laterality: N/A;  . KNEE ARTHROSCOPY     30 Years ago    There were no vitals filed for this visit.   Subjective Assessment - 08/01/20 1543    Subjective Patient reports feeling "kinda rotten" reporting the most pain currently along low back and LLE, though also has neck and thoracic pain. He has been completing HEP and feels that they are helping a little bit. He denies any falls since last session.    Currently in Pain? Yes    Pain Score 9     Pain Location Back    Pain Orientation Lower    Pain Descriptors / Indicators Burning;Tightness;Dull;Tiring    Pain Type Chronic pain    Pain Frequency Constant    Multiple Pain Sites Yes    Pain Score 6    Pain Location Neck    Pain Orientation Posterior    Pain Descriptors / Indicators Tightness    Pain Type Chronic pain    Pain Frequency Constant                             [facility] Adult PT Treatment/Exercise - 08/01/20 0001      Self-Care   Other Self-Care Comments  See patient education       Lumbar Exercises: Seated   Other Seated Lumbar Exercises SB rollout  2 min (fwd and R)    Other Seated Lumbar Exercises seated trunk flexion (increased pain when returning from bend)      Lumbar Exercises: Supine   Other Supine Lumbar Exercises SKC 2 x 10 (no effect), single knee to chest LLE 1 x 10 (slight decrease in pain)       Knee/Hip Exercises: Supine   Other Supine Knee/Hip Exercises seated HS stretch 2 x 15 sec each    Other Supine Knee/Hip Exercises figure 4 stretch 90 sec each, gluteal stretch 30 sec each       Manual Therapy   Manual therapy comments LLE LAD, L hip PROM to tolerance                   PT Education - 08/01/20 1640    Education Details Education on updated HEP. Education on positions to currently avoid and positioning for potential pain relief. Review of importance of use of SPC to reduce fall risk. Postural education.    Person(s) Educated Patient    Methods  Explanation;Demonstration;Tactile cues;Verbal cues;Handout    Comprehension Verbalized understanding;Returned demonstration;Verbal cues required;Need further instruction            PT Short Term Goals - 07/28/20 2003      PT SHORT TERM GOAL #1   Title Pt will be independent with initial HEP.    Baseline Pt provided initial HEP at evaluation 07/28/2020.    Time 3    Period Weeks    Status New    Target Date 08/18/20      PT SHORT TERM GOAL #2   Title Patient will report no falls for at least 1 consecutive week.    Baseline Pt falls and trips frequently due to symptoms in back and LLE.    Time 3    Period Weeks    Status New    Target Date 08/18/20      PT SHORT TERM GOAL #3   Title Patient's LLE MMT will improve to at least 4/5 grossly.    Baseline 3+/5    Time 3    Period Weeks    Status New    Target Date 08/18/20      PT SHORT TERM GOAL #4   Title Pt will be able to ambulate 50 feet using SPC including at least 1 left turn without falling or LOB.    Baseline Pt has exacerbation of symptoms with turning/rotating left, which is when he typically falls.    Time 3    Period Weeks    Status New    Target Date 08/18/20             PT Long Term Goals - 07/28/20 2007      PT LONG TERM GOAL #1   Title Pt will be independent with advanced HEP.    Baseline Pt provided initial HEP at evaluation 07/28/2020.    Time 6    Period Weeks    Status New    Target Date 09/08/20      PT LONG TERM GOAL #2   Title Patient will report no falls for at least 3 consecutive weeks.    Baseline Pt falls and trips frequently due to symptoms in back and LLE.    Time 6    Period Weeks    Status New    Target Date 09/08/20      PT LONG TERM GOAL #3   Title Patient's LLE MMT will improve to at  least 4+/5 grossly.    Baseline 3+/5    Time 6    Period Weeks    Status New    Target Date 09/08/20      PT LONG TERM GOAL #4   Title Pt will be able to ambulate 150 feet using SPC  including at least 2 left turns without falling or LOB.    Baseline Pt has exacerbation of symptoms with turning/rotating left, which is when he typically falls.    Time 6    Period Weeks    Status New    Target Date 09/08/20                 Plan - 08/01/20 1644    Clinical Impression Statement Slight reduction in low back pain with single knee to chest (LLE) and left hip distraction, though pain quickly returns when patient returns to standing. Overall good tolerance to trunk and hip mobility exercises without increased pain reported throughout session. Patient required heavy cues to complete prescribed sets/reps as he is easily distracted during session. Patient demonstrates symmetrical pelvic alignment at today's session.    Personal Factors and Comorbidities Age;Time since onset of injury/illness/exacerbation;Past/Current Experience;Comorbidity 1;Comorbidity 3+;Comorbidity 2    Comorbidities see extensive PMH above    PT Treatment/Interventions ADLs/Self Care Home Management;Aquatic Therapy;Cryotherapy;Iontophoresis 4mg /ml Dexamethasone;Moist Heat;Neuromuscular re-education;Balance training;Therapeutic exercise;Therapeutic activities;Functional mobility training;Stair training;Gait training;Patient/family education;Energy conservation;Manual techniques;Dry needling;Passive range of motion;Taping;Electrical Stimulation    PT Next Visit Plan Assess response to repeated flexion. Continue with LAD, manual therapy. Introduce core and gluteal strengthening as tolerated.    PT Home Exercise Plan Access Code [email or web address] ExercisesSupine Figure 4 Piriformis Stretch - 2 x daily - 7 x weekly - 3 sets - 30 sec holdSeated Flexion Stretch with Swiss Ball - 2 x daily - 7 x weekly - 2 sets - 10 repsSupine Single Knee to Chest Stretch - 2 x daily - 7 x weekly - 2 sets - 10 repsSupine Gluteus Stretch - 2 x daily - 7 x weekly - 3 sets - 30 sec hold    Consulted and Agree with Plan of Care Patient            Patient will benefit from skilled therapeutic intervention in order to improve the following deficits and impairments:  Difficulty walking, Abnormal gait, Decreased activity tolerance, Decreased balance, Decreased mobility, Decreased strength, Impaired sensation, Pain, Increased muscle spasms, Decreased endurance, Decreased range of motion, Improper body mechanics, Postural dysfunction  Visit Diagnosis: Chronic left-sided low back pain with left-sided sciatica  Difficulty in walking, not elsewhere classified  Muscle weakness (generalized)  Cervicalgia     Problem List Patient Active Problem List   Diagnosis Date Noted  . Occult blood in stools 06/15/2020  . Mesenteric lymphadenopathy 06/14/2020  . Splenomegaly- mild  06/14/2020  . Screening for blood or protein in urine 06/14/2020  . Elevated blood pressure reading 06/14/2020  . History of cannabis abuse 06/14/2020  . Recurrent major depressive disorder, in partial remission ([place]) 06/09/2020  . Anxiety 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 06/09/2020  . Alcohol use disorder, moderate, dependence ([place]) 06/09/2020  . Spinal stenosis of cervical region 06/03/2020  . Frequent falls 05/23/2020  . Foraminal stenosis of cervical region 05/09/2020  . Diabetic polyneuropathy   . ADHD (attention deficit hyperactivity disorder)   . Generalized anxiety disorder   . Major depressive disorder   . Other spondylosis with radiculopathy, cervical region 09/22/2019  . Low back pain 07/28/2019  . Essential hypertension, benign 01/28/2019  .  Alcoholic cirrhosis of liver 06/02/2018  . Type 2 diabetes mellitus with hyperglycemia 04/25/2018  . Alcohol use disorder, mild, abuse 04/25/2018    Edwin Cap 08/01/2020, 5:01 PM  [facility] [address] [place], [place], [address] Phone: [phone]   Fax:  [phone]  Name: Gerald Olson MRN: [id number] Date of Birth: [birth year]

## 2020-08-02 ENCOUNTER — Telehealth: Payer: Self-pay | Admitting: Endocrinology

## 2020-08-02 ENCOUNTER — Telehealth: Payer: Self-pay | Admitting: Adult Health

## 2020-08-02 ENCOUNTER — Other Ambulatory Visit: Payer: Self-pay

## 2020-08-02 ENCOUNTER — Other Ambulatory Visit: Payer: Self-pay | Admitting: Adult Health

## 2020-08-02 ENCOUNTER — Telehealth: Payer: Self-pay

## 2020-08-02 DIAGNOSIS — E1165 Type 2 diabetes mellitus with hyperglycemia: Secondary | ICD-10-CM

## 2020-08-02 DIAGNOSIS — I10 Essential (primary) hypertension: Secondary | ICD-10-CM

## 2020-08-02 MED ORDER — LISINOPRIL 20 MG PO TABS: 20.0000 mg | ORAL_TABLET | Freq: Every day | ORAL | 0 refills | Status: DC

## 2020-08-02 MED ORDER — LANTUS SOLOSTAR 100 UNIT/ML ~~LOC~~ SOPN: 22.0000 [IU] | PEN_INJECTOR | SUBCUTANEOUS | 2 refills | Status: DC

## 2020-08-02 MED ORDER — METFORMIN HCL ER 500 MG PO TB24: 1000.0000 mg | ORAL_TABLET | Freq: Two times a day (BID) | ORAL | 2 refills | Status: DC

## 2020-08-02 MED ORDER — TOPIRAMATE 50 MG PO TABS
50.0000 mg | ORAL_TABLET | Freq: Two times a day (BID) | ORAL | 0 refills | Status: DC
Start: 2020-08-02 — End: 2020-08-30

## 2020-08-02 MED FILL — TOPIRAMATE 50 MG TABLET: 50 | 30 days supply | Qty: 60 | Fill #0

## 2020-08-02 MED FILL — metFORMIN HCL ER 500 MG TB2: 500 | 30 days supply | Qty: 120 | Fill #0

## 2020-08-02 MED FILL — LISINOPRIL 20 MG TABLET: 20 | 90 days supply | Qty: 90 | Fill #0

## 2020-08-02 NOTE — Telephone Encounter (Signed)
Notes to clinic:  medication filled by a different provider  Review for refill    Requested Prescriptions  Pending Prescriptions Disp Refills   topiramate (TOPAMAX) 50 MG tablet 60 tablet 5    Sig: Take 1 tablet (50 mg total) by mouth 2 (two) times daily.      Not Delegated - Neurology: Anticonvulsants - topiramate & zonisamide Failed - 08/02/2020  9:34 AM      Failed - This refill cannot be delegated      Passed - Cr in normal range and within 360 days    Creatinine  Date Value Ref Range Status  05/23/2020 219.8 20.0 - 300.0 mg/dL Final   Creat  Date Value Ref Range Status  01/12/2019 0.80 0.70 - 1.33 mg/dL Final    Comment:    For patients >77 years of age, the reference limit for Creatinine is approximately 13% higher for people identified as African-American. .    Creatinine, Ser  Date Value Ref Range Status  07/18/2020 1.10 0.61 - 1.24 mg/dL Final          Passed - CO2 in normal range and within 360 days    CO2  Date Value Ref Range Status  06/14/2020 21 20 - 29 mmol/L Final          Passed - Valid encounter within last 12 months    Recent Outpatient Visits           1 month ago Alcohol use disorder, mild, abuse   [facility], Kelby Aline, FNP   2 months ago Uncontrolled type 2 diabetes mellitus with hyperglycemia [facility])   [place] Elsie Stain, MD   2 months ago Type 2 diabetes mellitus with hyperglycemia   [place] Elsie Stain, MD   5 months ago Uncontrolled type 2 diabetes mellitus with hyperglycemia [facility])   [place], Annie Main L, RPH-CPP   5 months ago Uncontrolled type 2 diabetes mellitus with hyperglycemia [facility])   [place], [place] J, NP       Future Appointments             In 1 month Flinchum, Kelby Aline, [place], PEC               lisinopril (ZESTRIL) 20 MG tablet 90 tablet 0    Sig: Take 1 tablet (20 mg total) by mouth daily.      Cardiovascular:  ACE Inhibitors Failed - 08/02/2020  9:34 AM      Failed - Last BP in normal range    BP Readings from Last 1 Encounters:  07/28/20 (!) 157/96          Passed - Cr in normal range and within 180 days    Creatinine  Date Value Ref Range Status  05/23/2020 219.8 20.0 - 300.0 mg/dL Final   Creat  Date Value Ref Range Status  01/12/2019 0.80 0.70 - 1.33 mg/dL Final    Comment:    For patients >41 years of age, the reference limit for Creatinine is approximately 13% higher for people identified as African-American. .    Creatinine, Ser  Date Value Ref Range Status  07/18/2020 1.10 0.61 - 1.24 mg/dL Final          Passed - K in normal range and within 180 days    Potassium  Date Value Ref  Range Status  06/14/2020 4.1 3.5 - 5.2 mmol/L Final          Passed - Patient is not pregnant      Passed - Valid encounter within last 6 months    Recent Outpatient Visits           1 month ago Alcohol use disorder, mild, abuse   [facility], Kelby Aline, FNP   2 months ago Uncontrolled type 2 diabetes mellitus with hyperglycemia [facility])   [place] Elsie Stain, MD   2 months ago Type 2 diabetes mellitus with hyperglycemia   [place] Elsie Stain, MD   5 months ago Uncontrolled type 2 diabetes mellitus with hyperglycemia [facility])   [place], Stephen L, RPH-CPP   5 months ago Uncontrolled type 2 diabetes mellitus with hyperglycemia [facility])   [place], [place], NP       Future Appointments             In 1 month Flinchum, Kelby Aline, [place], [place]

## 2020-08-02 NOTE — Telephone Encounter (Signed)
Needs office follow up, also another message regarding pain clinic, I advise he address this first with oncology since he is seeing them at this time and has follow up scheduled.

## 2020-08-02 NOTE — Telephone Encounter (Signed)
Not on current medication list.   

## 2020-08-02 NOTE — Telephone Encounter (Signed)
Copied from [place] [id number]. Topic: Quick Communication - Rx Refill/Question >> Aug 02, 2020  9:35 AM Leward Quan A wrote: Medication: traMADol (ULTRAM) 50 MG tablet   Has the patient contacted their pharmacy? Yes.   (Agent: If no, request that the patient contact the pharmacy for the refill.) (Agent: If yes, when and what did the pharmacy advise?)  Preferred Pharmacy (with phone number or street name): [place], [address]  Phone:  [phone] Fax:  [phone]     Agent: Please be advised that RX refills may take up to 3 business days. We ask that you follow-up with your pharmacy.

## 2020-08-02 NOTE — Telephone Encounter (Signed)
Copied from [place] [phone]. Topic: Quick Communication - Rx Refill/Question >> Aug 02, 2020  9:27 AM Leward Quan A wrote: Medication: topiramate (TOPAMAX) 50 MG tablet, lisinopril (ZESTRIL) 20 MG tablet   Has the patient contacted their pharmacy? Yes.   (Agent: If no, request that the patient contact the pharmacy for the refill.) (Agent: If yes, when and what did the pharmacy advise?) [place], [address] [place]  Phone:  [phone] Fax:  [phone]    Preferred Pharmacy (with phone number or street name):   Agent: Please be advised that RX refills may take up to 3 business days. We ask that you follow-up with your pharmacy.

## 2020-08-02 NOTE — Telephone Encounter (Signed)
Needs office follow up, also another message regarding Ultram refill,  I advise he address this first with oncology since he is seeing them at this time and has follow up scheduled.  Duplicate message.

## 2020-08-02 NOTE — Telephone Encounter (Signed)
Patient states he no longer sees Dr Joya Gaskins - called to request refill for:  Metformin and Lantus  Pharmacy:  [place], [place] [address]  [place], [place] [address]  Phone:  [phone] Fax:  [phone]

## 2020-08-02 NOTE — Telephone Encounter (Signed)
RX sent to pharmacy  

## 2020-08-02 NOTE — Telephone Encounter (Signed)
Copied from [place] [phone]. Topic: General - Other >> Aug 02, 2020  9:40 AM Leward Quan A wrote: Reason for CRM: Patient called ibn wanting Sharyn Lull Flinchum to please refer him to pain management for his back pain. Please call Ph# [phone]

## 2020-08-03 NOTE — Telephone Encounter (Signed)
Patient advised and appt was set for tomorrow to follow up with you at 4PM. Patient states that he has been out of meds for weeks. KW

## 2020-08-04 ENCOUNTER — Encounter: Payer: Self-pay | Admitting: Gastroenterology

## 2020-08-04 ENCOUNTER — Ambulatory Visit (INDEPENDENT_AMBULATORY_CARE_PROVIDER_SITE_OTHER): Payer: Medicaid Other | Admitting: Gastroenterology

## 2020-08-04 ENCOUNTER — Ambulatory Visit (INDEPENDENT_AMBULATORY_CARE_PROVIDER_SITE_OTHER): Payer: Medicaid Other | Admitting: Adult Health

## 2020-08-04 ENCOUNTER — Other Ambulatory Visit: Payer: Self-pay

## 2020-08-04 VITALS — BP 134/84 | HR 112 | Temp 98.4°F | Ht 74.0 in | Wt 244.0 lb

## 2020-08-04 DIAGNOSIS — Z5329 Procedure and treatment not carried out because of patient's decision for other reasons: Secondary | ICD-10-CM

## 2020-08-04 DIAGNOSIS — K7030 Alcoholic cirrhosis of liver without ascites: Secondary | ICD-10-CM | POA: Diagnosis not present

## 2020-08-04 DIAGNOSIS — Z91199 Patient's noncompliance with other medical treatment and regimen due to unspecified reason: Secondary | ICD-10-CM | POA: Insufficient documentation

## 2020-08-04 MED FILL — LANTUS SOLOSTAR 100 UNITS/M: 100 | 83 days supply | Qty: 15 | Fill #0

## 2020-08-04 NOTE — Progress Notes (Signed)
Gastroenterology Consultation  Referring Provider:     Sharmon Leyden* Primary Care Physician:  Doreen Beam, FNP Primary Gastroenterologist:  Dr. Allen Norris     Reason for Consultation:     Possible cirrhosis        HPI:   Gerald Olson is a 54 y.o. y/o male referred for consultation & management of Possible cirrhosis by Dr. Claudina Lick, Kelby Aline, FNP.  This patient comes in today with a history of being told he had cirrhosis 20 years ago.  The patient states that he was taking a lot of anti-inflammatory medications then he believes that that is the cause of his cirrhosis.  He does report that he has a history of alcohol use but states that although he has had weeks where he has drank more than other weeks he usually only has a few beers a day and usually on average 2.  He states that he has not seen a gastrologist in the past.  The patient was found to have iron deficiency anemia and was recently diagnosed with CML.  The patient reports that he has never had a colonoscopy or seen a liver specialist for his liver disease.  The patient has had chronically elevated liver enzymes with some of the liver enzymes showing AST and ALT to be of equal value with other time showing the ALT to be higher and most recently the labs showed his AST to be higher.The patient had a ferritin that was low at 11. 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 The patient suffers from multiple musculoskeletal pains and states that his be air helps with that and sodas and temperature medication  Past Medical History:  Diagnosis Date  . ADHD (attention deficit hyperactivity disorder)    Diagnosed during teenage years  . Alcohol use disorder    On average 14-21 drinks per week  . Alcoholic cirrhosis of liver    Per patient - condition is not related to moderate alcohol use but  rather prolonged NSAID use and recent abuse.  . Allergy   . Calculus of gallbladder without cholecystitis without obstruction   . Chronic pain    Neck/lower back  . Degenerative disc disease, lumbar   . Diabetic polyneuropathy   . Essential hypertension, benign   . Generalized anxiety disorder   . Glaucoma    Per patient report  . Headache   . 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 Major depressive disorder   . Other spondylosis with radiculopathy, cervical region   . Retinopathy    Right eye; per patient report  . Type 2 diabetes mellitus with hyperglycemia     Past Surgical History:  Procedure Laterality Date  . CHOLECYSTECTOMY N/A 06/02/2018   Procedure: LAPAROSCOPIC CHOLECYSTECTOMY;  Surgeon: Vickie Epley, MD;  Location: [facility] ORS;  Service: General;  Laterality: N/A;  . KNEE ARTHROSCOPY     30 Years ago    Prior to Admission medications   Medication Sig Start Date End Date Taking? Authorizing Provider  Accu-Chek FastClix Lancets MISC Use as instructed to check blood sugar up to 3 times daily. Patient taking differently: 1 each by Other route 2 (two) times daily. 06/15/19  Yes Fulp, Cammie, MD  ALPHA-LIPOIC ACID PO Take by mouth.   Yes [provider]  Blood Glucose Monitoring Suppl (ACCU-CHEK GUIDE ME) w/Device KIT 1 kit by Does not apply route 3 (three) times daily. Patient taking differently: 1 kit by Does not apply route 2 (two) times daily. 06/15/19  Yes Fulp, Cammie, MD  calcium-vitamin D (OSCAL WITH D) 500-200 MG-UNIT tablet Take 1 tablet by mouth.   Yes [provider]  dapagliflozin propanediol (FARXIGA) 5 MG TABS tablet Take 1 tablet (5 mg total) by mouth daily before breakfast. 05/04/20  Yes [name], MD  DULoxetine (CYMBALTA) 60 MG capsule Take 1 capsule (60 mg total) by mouth 2 (two) times daily. 06/09/20 06/09/21 Yes [place] Crane, MD  glucose blood (ACCU-CHEK GUIDE) test strip Use as  instructed to check blood sugar up to 3 times daily. Patient taking differently: 1 each by Other route 2 (two) times daily. 12/16/19  Yes Argentina Donovan, PA-C  imatinib (GLEEVEC) 400 MG tablet Take 1 tablet (400 mg total) by mouth daily. Take with meals and large glass of water. 07/11/20  Yes Lloyd Huger, MD  insulin glargine (LANTUS SOLOSTAR) 100 UNIT/ML Solostar Pen Inject 22 Units into the skin every morning. And pen needles 1/day 08/02/20  Yes [name], MD  lisinopril (ZESTRIL) 20 MG tablet Take 1 tablet (20 mg total) by mouth daily. 08/02/20  Yes Flinchum, Kelby Aline, FNP  metFORMIN (GLUCOPHAGE-XR) 500 MG 24 hr tablet Take 2 tablets (1,000 mg total) by mouth 2 (two) times daily with a meal. 08/02/20  Yes [name], MD  methocarbamol (ROBAXIN) 500 MG tablet Take 2 tablets (1,000 mg total) by mouth every 6 (six) hours as needed for muscle spasms. 05/23/20  Yes Elsie Stain, MD  Multiple Vitamins-Minerals (MULTIVITAMIN WITH MINERALS) tablet Take 1 tablet by mouth daily.    Yes [provider]  Nutritional Supplements (DHEA PO) Take 100 mg by mouth daily.   Yes [provider]  OVER THE COUNTER MEDICATION Black seed oil 2000per day   Yes [provider]  topiramate (TOPAMAX) 50 MG tablet Take 1 tablet (50 mg total) by mouth 2 (two) times daily. Please call your specialist to schedule follow up due. 08/02/20  Yes Flinchum, Kelby Aline, FNP  TRUEPLUS PEN NEEDLES 32G X 4 MM MISC 1 each by Other route daily.  01/07/20  Yes [provider]  zinc gluconate 50 MG tablet Take 50 mg by mouth daily.   Yes [provider]    Family History  Problem Relation Age of Onset  . Aneurysm Father   . Multiple sclerosis Sister   . Emphysema Maternal Grandfather   . Diabetes Neg Hx      Social History   Tobacco Use  . Smoking status: Former Smoker    Quit date: 06/25/2003    Years since quitting: 17.1  . Smokeless tobacco: Never Used  . Tobacco  comment: Currently vapes  Vaping Use  . Vaping Use: Every day  Substance Use Topics  . Alcohol use: Yes    Alcohol/week: 14.0 - 21.0 standard drinks    Types: 14 - 21 Cans of beer per week    Comment: 2-3 beers per night on average; sometimes up to 6  . Drug use: Yes    Types: Marijuana    Comment: 1/week to help sleep; edibles, does not smoke    Allergies as of 08/04/2020 - Review Complete 08/04/2020  Allergen Reaction Noted  . Hydrocodone Itching 06/02/2018  . Oxycontin [oxycodone hcl] Itching 06/02/2018    Review of Systems:  All systems reviewed and negative except where noted in HPI.   Physical Exam:  BP 134/84   Pulse (!) 112   Temp 98.4 F (36.9 C) (Oral)   Ht $R'6\' 2"'cC$  (1.88 m)   Wt 244 lb (110.7 kg)   BMI 31.33 kg/m  No LMP for male patient. General:   Alert,  Well-developed, well-nourished, pleasant and cooperative in NAD Head:  Normocephalic and atraumatic. Eyes:  Sclera clear, no icterus.   Conjunctiva pink. Ears:  Normal auditory acuity. Neck:  Supple; no masses or thyromegaly. Lungs:  Respirations even and unlabored.  Clear throughout to auscultation.   No wheezes, crackles, or rhonchi. No acute distress. Heart:  Regular rate and rhythm; no murmurs, clicks, rubs, or gallops. Abdomen:  Normal bowel sounds.  No bruits.  Soft, non-tender and non-distended without masses, hepatosplenomegaly or hernias noted.  No guarding or rebound tenderness.  Negative Carnett sign.   Rectal:  Deferred.  Pulses:  Normal pulses noted. Extremities:  No clubbing or edema.  No cyanosis. Neurologic:  Alert and oriented x3;  grossly normal neurologically. Skin:  Intact without significant lesions or rashes.  No jaundice. Lymph Nodes:  No significant cervical adenopathy. Psych:  Alert and cooperative. Normal mood and affect.  Imaging Studies: CT CHEST ABDOMEN PELVIS W CONTRAST  Result Date: 07/18/2020 CLINICAL DATA:  CML EXAM: CT CHEST, ABDOMEN, AND PELVIS WITH CONTRAST  TECHNIQUE: Multidetector CT imaging of the chest, abdomen and pelvis was performed following the standard protocol during bolus administration of intravenous contrast. CONTRAST:  170mL OMNIPAQUE IOHEXOL 300 MG/ML SOLN, additional oral enteric contrast COMPARISON:  CT abdomen pelvis, 03/01/2020 FINDINGS: CT CHEST FINDINGS Cardiovascular: No significant vascular findings. Normal heart size. No pericardial effusion. Mediastinum/Nodes: No enlarged mediastinal, hilar, or axillary lymph nodes. Thyroid gland, trachea, and esophagus demonstrate no significant findings. Lungs/Pleura: Lungs are clear. No pleural effusion or pneumothorax. Musculoskeletal: No chest wall mass or suspicious bone lesions identified. CT ABDOMEN PELVIS FINDINGS Hepatobiliary: Coarse, nodular contour of the liver. No focal liver lesion. Status post cholecystectomy. No biliary ductal dilatation. Pancreas: Unremarkable. No pancreatic ductal dilatation or surrounding inflammatory changes. Spleen: Unchanged splenomegaly, maximum span 17.7 cm. Adrenals/Urinary Tract: Adrenal glands are unremarkable. Kidneys are normal, without renal calculi, solid lesion, or hydronephrosis. Bladder is unremarkable. Stomach/Bowel: Stomach is within normal limits. Appendix appears normal. No evidence of bowel wall thickening, distention, or inflammatory changes. Vascular/Lymphatic: Aortic atherosclerosis. 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 Numerous unchanged enlarged lymph nodes of the small bowel mesentery, an index nodule in the left hemiabdomen measuring 2.0 x 1.0 cm (series 2, image 82). There has been interval resolution of previously seen fat stranding. Reproductive: No mass or other abnormality. Other: No abdominal wall hernia or abnormality. No abdominopelvic ascites. Musculoskeletal: No acute or significant osseous findings. IMPRESSION: 1. Numerous unchanged enlarged lymph nodes of the  small bowel mesentery. No evidence of new mass or lymphadenopathy in the chest, abdomen, or pelvis. 2. Unchanged splenomegaly. 3. Coarse, nodular contour of the liver, suggestive of cirrhosis. No focal liver lesion. 4. 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 Aortic Atherosclerosis (ICD10-I70.0). Electronically Signed   By: Eddie Candle M.D.   On: 07/18/2020 15:04   CT BONE MARROW BIOPSY  Result Date: 07/18/2020 INDICATION: Lymphadenopathy. Concern for CML. Please perform CT-guided bone marrow biopsy for tissue diagnostic purposes. EXAM: CT-GUIDED BONE MARROW BIOPSY AND ASPIRATION MEDICATIONS:  None ANESTHESIA/SEDATION: Fentanyl 100 mcg IV; Versed 2 mg IV Sedation Time: 10 Minutes; The patient was continuously monitored during the procedure by the interventional radiology nurse under my direct supervision. COMPLICATIONS: None immediate. PROCEDURE: Informed consent was obtained from the patient following an explanation of the procedure, risks, benefits and alternatives. The patient understands, agrees and consents for the procedure. All questions were addressed. A time out was performed prior to the initiation of the procedure. The patient was positioned prone and non-contrast localization CT was performed of the pelvis to demonstrate the iliac marrow spaces. The operative site was prepped and draped in the usual sterile fashion. Under sterile conditions and local anesthesia, a 22 gauge spinal needle was utilized for procedural planning. Next, an 11 gauge coaxial bone biopsy needle was advanced into the left iliac marrow space. Needle position was confirmed with CT imaging. Initially, a bone marrow aspiration was performed. Next, a bone marrow biopsy was obtained with the 11 gauge outer bone marrow device. The needle was removed and superficial hemostasis was obtained with manual compression. A dressing was applied. The patient tolerated the procedure well  without immediate post procedural complication. IMPRESSION: Successful CT guided left iliac bone marrow aspiration and core biopsy. Electronically Signed   By: Sandi Mariscal M.D.   On: 07/18/2020 11:11    Assessment and Plan:   Gerald Olson is a 54 y.o. y/o male who comes in today with a question of liver cirrhosis due to imaging showing a nodular liver and signs of portal hypertension. Of note is that the patient does have a normal albumin and normal platelets which would be expected to be abnormal with advanced cirrhosis. The patient will have his blood sends off for other possible causes of abnormal liver enzymes.  The patient will also have his immunity to hepatitis A and hepatitis B checked and he will be vaccinated accordingly.  The patient will also be set up for an EGD and colonoscopy due to his iron deficiency anemia. He was also found to have heme positive stools.  The patient has been explained the plan and agrees with it.    Lucilla Lame, MD. Marval Regal    Note: This dictation was prepared with Dragon dictation along with smaller phrase technology. Any transcriptional errors that result from this process are unintentional.

## 2020-08-04 NOTE — Progress Notes (Signed)
   Complete physical exam  Patient: Gerald Olson   DOB: 06/16/1999   54 y.o. Male  MRN: [id number]  Subjective:    No chief complaint on file.   Gerald Olson is a 54 y.o. male who presents today for a complete physical exam. She reports consuming a {diet types:17450} diet. {types:19826} She generally feels {DESC; WELL/FAIRLY WELL/POORLY:18703}. She reports sleeping {DESC; WELL/FAIRLY WELL/POORLY:18703}. She {does/does not:200015} have additional problems to discuss today.    Most recent fall risk assessment:    02/21/2022   10:42 AM  Fall Risk   Falls in the past year? 0  Number falls in past yr: 0  Injury with Fall? 0  Risk for fall due to : No Fall Risks  Follow up Falls evaluation completed     Most recent depression screenings:    02/21/2022   10:42 AM 01/12/2021   10:46 AM  PHQ 2/9 Scores  PHQ - 2 Score 0 0  PHQ- 9 Score 5     {VISON DENTAL STD PSA (Optional):27386}  {History (Optional):23778}  Patient Care Team: Jessup, Joy, NP as PCP - General (Nurse Practitioner)   Outpatient Medications Prior to Visit  Medication Sig   fluticasone (FLONASE) 50 MCG/ACT nasal spray Place 2 sprays into both nostrils in the morning and at bedtime. After 7 days, reduce to once daily.   norgestimate-ethinyl estradiol (SPRINTEC 28) 0.25-35 MG-MCG tablet Take 1 tablet by mouth daily.   Nystatin POWD Apply liberally to affected area 2 times per day   spironolactone (ALDACTONE) 100 MG tablet Take 1 tablet (100 mg total) by mouth daily.   No facility-administered medications prior to visit.    ROS        Objective:     There were no vitals taken for this visit. {Vitals History (Optional):23777}  Physical Exam   No results found for any visits on 03/29/22. {Show previous labs (optional):23779}    Assessment & Plan:    Routine Health Maintenance and Physical Exam  Immunization History  Administered Date(s) Administered   DTaP 08/30/1999, 10/26/1999,  01/04/2000, 09/19/2000, 04/04/2004   Hepatitis A 01/30/2008, 02/04/2009   Hepatitis B 06/17/1999, 07/25/1999, 01/04/2000   HiB (PRP-OMP) 08/30/1999, 10/26/1999, 01/04/2000, 09/19/2000   IPV 08/30/1999, 10/26/1999, 06/24/2000, 04/04/2004   Influenza,inj,Quad PF,6+ Mos 05/07/2014   Influenza-Unspecified 08/06/2012   MMR 06/24/2001, 04/04/2004   Meningococcal Polysaccharide 02/04/2012   Pneumococcal Conjugate-13 09/19/2000   Pneumococcal-Unspecified 01/04/2000, 03/19/2000   Tdap 02/04/2012   Varicella 06/24/2000, 01/30/2008    Health Maintenance  Topic Date Due   HIV Screening  Never done   Hepatitis C Screening  Never done   INFLUENZA VACCINE  03/27/2022   PAP-Cervical Cytology Screening  03/29/2022 (Originally 06/15/2020)   PAP SMEAR-Modifier  03/29/2022 (Originally 06/15/2020)   TETANUS/TDAP  03/29/2022 (Originally 02/03/2022)   HPV VACCINES  Discontinued   COVID-19 Vaccine  Discontinued    Discussed health benefits of physical activity, and encouraged her to engage in regular exercise appropriate for her age and condition.  Problem List Items Addressed This Visit   None Visit Diagnoses     Annual physical exam    -  Primary   Cervical cancer screening       Need for Tdap vaccination          No follow-ups on file.     Joy Jessup, NP   

## 2020-08-05 ENCOUNTER — Other Ambulatory Visit: Payer: Self-pay | Admitting: Adult Health

## 2020-08-08 ENCOUNTER — Ambulatory Visit: Payer: Medicaid Other

## 2020-08-08 ENCOUNTER — Other Ambulatory Visit: Payer: Self-pay

## 2020-08-08 DIAGNOSIS — R262 Difficulty in walking, not elsewhere classified: Secondary | ICD-10-CM

## 2020-08-08 DIAGNOSIS — M6281 Muscle weakness (generalized): Secondary | ICD-10-CM

## 2020-08-08 DIAGNOSIS — G8929 Other chronic pain: Secondary | ICD-10-CM

## 2020-08-08 DIAGNOSIS — M5442 Lumbago with sciatica, left side: Secondary | ICD-10-CM | POA: Diagnosis not present

## 2020-08-08 DIAGNOSIS — M542 Cervicalgia: Secondary | ICD-10-CM | POA: Diagnosis not present

## 2020-08-08 NOTE — Therapy (Signed)
[facility] [place], [place], [address] Phone: [phone]   Fax:  [phone]  Physical Therapy Treatment  Patient Details  Name: Gerald Olson MRN: [id number] Date of Birth: Dec 29, 1965 Referring Provider (PT): Marybelle Killings, MD    Encounter Date: 08/08/2020   PT End of Session - 08/08/20 1632    Visit Number 3    Number of Visits 13    Date for PT Re-Evaluation 09/10/20    Authorization Type Medicaid - [employer]    Authorization Time Period authorization pending    PT Start Time 3428    PT Stop Time 1630    PT Time Calculation (min) 45 min    Activity Tolerance Patient limited by pain;Patient tolerated treatment well    Behavior During Therapy [facility] for tasks assessed/performed;Impulsive           Past Medical History:  Diagnosis Date   ADHD (attention deficit hyperactivity disorder)    Diagnosed during teenage years   Alcohol use disorder    On average 14-21 drinks per week   Alcoholic cirrhosis of liver    Per patient - condition is not related to moderate alcohol use but rather prolonged NSAID use and recent abuse.   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   Subjective Assessment - 08/08/20 1548    Subjective Patient reports the "twinge" he used to feel in his back/leg is starting to be less,though he feels "terrible" today reporting stabbing pain in his mid back. Patient was supposed to f/u with PCP last week, but missed this appointment and is re-scheduled for tomorrow. Patient reports he might have "over did it" to cause continued pain in his midback.    Limitations Standing;Walking;House hold activities;Sitting    How long can you sit comfortably? "15-30 minutes before getting up, walking, stretching"    How long can you stand comfortably? "There's physical pain then also fatigue standing for a while - I would say 20 minutes" - takes seated rest breaks while cooking    How long can you walk comfortably? "About 20 minutes at the most"    Patient Stated Goals Reducing falls/not falling, being able to do daily activities    Currently in Pain? Yes    Pain Score 9     Pain Location Back    Pain Orientation Mid;Lower    Pain Descriptors / Indicators Burning;Dull    Pain Type Chronic pain    Multiple Pain Sites Yes    Pain Score 6    Pain  Location Neck    Pain Orientation Posterior    Pain Descriptors / Indicators Tightness    Pain Type Chronic pain    Pain Frequency Constant                             [facility] Adult PT Treatment/Exercise - 08/08/20 0001      Self-Care   Other Self-Care Comments  See patient education      Lumbar Exercises: Seated   Other Seated Lumbar Exercises SB rollout 2 min (fwd and Rt)      Lumbar Exercises: Supine   AB Set Limitations 2 x 10    Pelvic Tilt Limitations PPT 2 x 10      Lumbar Exercises: Sidelying   Other Sidelying Lumbar Exercises open book 1 x 5 each                  PT Education - 08/08/20 1603    Education Details Education on proper fit of SPC as current SPC  is too short. Education on updated HEP. Education on imaging findings. Postural education.    Person(s) Educated Patient    Methods Explanation;Demonstration;Tactile cues;Verbal cues    Comprehension Verbalized understanding;Returned demonstration;Verbal cues required;Need further instruction            PT Short Term Goals - 07/28/20 2003      PT SHORT TERM GOAL #1   Title Pt will be independent with initial HEP.    Baseline Pt provided initial HEP at evaluation 07/28/2020.    Time 3    Period Weeks    Status New    Target Date 08/18/20      PT SHORT TERM GOAL #2   Title Patient will report no falls for at least 1 consecutive week.    Baseline Pt falls and trips frequently due to symptoms in back and LLE.    Time 3    Period Weeks    Status New    Target Date 08/18/20      PT SHORT TERM GOAL #3   Title Patient's LLE MMT will improve to at least 4/5 grossly.    Baseline 3+/5    Time 3    Period Weeks    Status New    Target Date 08/18/20      PT SHORT TERM GOAL #4   Title Pt will be able to ambulate 50 feet using SPC including at least 1 left turn without falling or LOB.    Baseline Pt has exacerbation of symptoms with turning/rotating left, which is when he typically falls.    Time 3    Period Weeks    Status New    Target Date 08/18/20             PT Long Term Goals - 07/28/20 2007      PT LONG TERM GOAL #1   Title Pt will be independent with advanced HEP.    Baseline Pt provided initial HEP at evaluation 07/28/2020.    Time 6    Period Weeks    Status New    Target Date 09/08/20      PT LONG TERM GOAL #2   Title Patient will report no falls for at least 3 consecutive weeks.    Baseline Pt falls and trips frequently due to symptoms in back and LLE.    Time 6    Period Weeks    Status New  Target Date 09/08/20      PT LONG TERM GOAL #3   Title Patient's LLE MMT will improve to at least 4+/5 grossly.    Baseline 3+/5    Time 6    Period Weeks     Status New    Target Date 09/08/20      PT LONG TERM GOAL #4   Title Pt will be able to ambulate 150 feet using SPC including at least 2 left turns without falling or LOB.    Baseline Pt has exacerbation of symptoms with turning/rotating left, which is when he typically falls.    Time 6    Period Weeks    Status New    Target Date 09/08/20                 Plan - 08/08/20 1608    Clinical Impression Statement Though patient reports high subjective pain levels at beginning of session, patient in no obvious distress throughout session. Patient educated on proper fit of SPC and recommendation to obtain [facility] of the appropriate height, as his current [facility] is too short and is attributing to his aberrant gait mechanics. Introduction to core stabilization with patient demonstrating good TA activation, though requires consistent postural cues throughout session as he has tendency to demonstrate excessive lumbar lordosis during supine and seated activity.    Personal Factors and Comorbidities Age;Time since onset of injury/illness/exacerbation;Past/Current Experience;Comorbidity 1;Comorbidity 3+;Comorbidity 2    Comorbidities see extensive PMH above    PT Treatment/Interventions ADLs/Self Care Home Management;Aquatic Therapy;Cryotherapy;Iontophoresis 4mg /ml Dexamethasone;Moist Heat;Neuromuscular re-education;Balance training;Therapeutic exercise;Therapeutic activities;Functional mobility training;Stair training;Gait training;Patient/family education;Energy conservation;Manual techniques;Dry needling;Passive range of motion;Taping;Electrical Stimulation    PT Next Visit Plan Assess response to repeated flexion. Continue with LAD, manual therapy. Introduce core and gluteal strengthening as tolerated.    PT Home Exercise Plan Access Code [email or web address] ExercisesSupine Figure 4 Piriformis Stretch - 2 x daily - 7 x weekly - 3 sets - 30 sec holdSeated Flexion Stretch with Swiss Ball - 2 x daily - 7 x weekly - 2  sets - 10 repsSupine Single Knee to Chest Stretch - 2 x daily - 7 x weekly - 2 sets - 10 repsSupine Gluteus Stretch - 2 x daily - 7 x weekly - 3 sets - 30 sec hold    Consulted and Agree with Plan of Care Patient           Patient will benefit from skilled therapeutic intervention in order to improve the following deficits and impairments:  Difficulty walking,Abnormal gait,Decreased activity tolerance,Decreased balance,Decreased mobility,Decreased strength,Impaired sensation,Pain,Increased muscle spasms,Decreased endurance,Decreased range of motion,Improper body mechanics,Postural dysfunction  Visit Diagnosis: Chronic left-sided low back pain with left-sided sciatica  Difficulty in walking, not elsewhere classified  Muscle weakness (generalized)  Cervicalgia     Problem List Patient Active Problem List   Diagnosis Date Noted   No-show for appointment 08/04/2020   Occult blood in stools 06/15/2020   Mesenteric lymphadenopathy 06/14/2020   Splenomegaly- mild  06/14/2020   Screening for blood or protein in urine 06/14/2020   Elevated blood pressure reading 06/14/2020   History of cannabis abuse 06/14/2020   Recurrent major depressive disorder, in partial remission ([place]) 06/09/2020   Anxiety 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 06/09/2020   Alcohol use disorder, moderate, dependence ([place]) 06/09/2020   Spinal stenosis of cervical region 06/03/2020   Frequent falls 05/23/2020   Foraminal stenosis of cervical region 05/09/2020   Diabetic polyneuropathy    ADHD (attention deficit hyperactivity disorder)  Generalized anxiety disorder    Major depressive disorder    Other spondylosis with radiculopathy, cervical region 09/22/2019   Low back pain 07/28/2019   Essential hypertension, benign 96/78/9381   Alcoholic cirrhosis of liver 06/02/2018   Type 2 diabetes mellitus with hyperglycemia 04/25/2018   Alcohol use disorder, mild, abuse 04/25/2018   Gwendolyn Grant, PT, DPT, ATC 08/08/20 4:35 PM  [facility] [address] [place], [place], [address] Phone: [phone]   Fax:  [phone]  Name: WEBSTER PATRONE MRN: [id number] Date of Birth: [birth year]

## 2020-08-09 ENCOUNTER — Other Ambulatory Visit: Payer: Self-pay | Admitting: Adult Health

## 2020-08-09 ENCOUNTER — Encounter: Payer: Self-pay | Admitting: Adult Health

## 2020-08-09 ENCOUNTER — Encounter (HOSPITAL_COMMUNITY): Payer: Self-pay | Admitting: Psychiatry

## 2020-08-09 ENCOUNTER — Ambulatory Visit (INDEPENDENT_AMBULATORY_CARE_PROVIDER_SITE_OTHER): Payer: Medicaid Other | Admitting: Psychiatry

## 2020-08-09 ENCOUNTER — Ambulatory Visit: Payer: Medicaid Other | Admitting: Adult Health

## 2020-08-09 VITALS — BP 149/92 | HR 97 | Temp 99.1°F | Resp 16 | Wt 241.2 lb

## 2020-08-09 VITALS — BP 163/90 | HR 94 | Ht 74.0 in | Wt 239.0 lb

## 2020-08-09 DIAGNOSIS — G8929 Other chronic pain: Secondary | ICD-10-CM | POA: Diagnosis not present

## 2020-08-09 DIAGNOSIS — F1020 Alcohol dependence, uncomplicated: Secondary | ICD-10-CM | POA: Diagnosis not present

## 2020-08-09 DIAGNOSIS — F0634 Mood disorder due to known physiological condition with mixed features: Secondary | ICD-10-CM | POA: Diagnosis not present

## 2020-08-09 DIAGNOSIS — F064 Anxiety disorder due to known physiological condition: Secondary | ICD-10-CM

## 2020-08-09 DIAGNOSIS — W19XXXA Unspecified fall, initial encounter: Secondary | ICD-10-CM

## 2020-08-09 DIAGNOSIS — M546 Pain in thoracic spine: Secondary | ICD-10-CM

## 2020-08-09 MED ORDER — TRAMADOL HCL 50 MG PO TABS
50.0000 mg | ORAL_TABLET | Freq: Two times a day (BID) | ORAL | 0 refills | Status: DC | PRN
Start: 2020-08-09 — End: 2020-08-09

## 2020-08-09 MED FILL — traMADol HCL 50 MG TABS: 50 | 5 days supply | Qty: 10 | Fill #0

## 2020-08-09 NOTE — Progress Notes (Signed)
[facility] Progress Note   Patient Identification: Gerald Olson MRN:  [id number] Date of Evaluation:  08/09/2020    Chief Complaint:  " I diagnosed myself with burnout in March 2018, I meet all the ICD-10 criteria.  I weaned myself off of the Cymbalta."  Visit Diagnosis:    ICD-10-CM   1. 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 Anxiety 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  F06.4   3. Alcohol use disorder, moderate, dependence (HCC)  F10.20     History of Present Illness: This is a 54 year old male with history of depression, anxiety, alcohol use disorder, cervical and lumbar disc disease, diabetes mellitus type 2, hypertension, alcoholic cirrhosis, concussion, retinopathy, chronic pain now seen for follow up.  Patient reported that he weaned himself off the Cymbalta a few weeks ago.  He stated that he can tell a difference went off of that and also the other medications.  He stated that the only 2 medications he continue to take were insulin and Metformin for his diabetes.  He eventually got back on other medications as well but did not restart the Cymbalta. He explained in great detail how he diagnosed himself with burnout in March 2018 and how he meets all the ICD-10 criteria for that. He stated that he has not noticed any difference in his mood whatsoever.  Then he stated that he had tinnitus secondary to burnout and maybe the Cymbalta was helping him with that if any.  However other than that improvement he never noticed Cymbalta helping him with anything else.  Writer asked him if he weaned himself off the alcohol, he replied that he has not had any in the last 1.5 weeks.  And then a few seconds later he stated that he actually has not had any money to buy any alcohol and that is why he has not had any alcohol in the last 1.5 weeks. He stated that he basically was drinking to help his pain however he has been so appropriately that he has no money to  buy anything including food.  He went on a long elaborate discussion of how he is managing his finances.  He informed that he had to bed borrow and steal money from others in order to buy electric heater so that he can have hot water in his house.  He mentioned that his mother gave him half the money for the heater and the remaining money he basically but other friends for work. He stated that now he has the hot water heater however he needs to find a plumber to get connected in his house. He then spoke in great detail about his chronic pain and how debilitating it is for him.  He mentioned how he still struggling with his daily activities.  He informed that he has not had any pain medication since last week.  He supposed to go and see his pain management provider today.  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  He stated that his exam also sheets are used as examples for others in the school.  Then towards the very end he mentioned that he was recently diagnosed with chronic myelocytic leukemia.  He stated that he just laughed when he found out about it because he cannot believe that he is going through that along with all the other stress.  He stated that he is already started oral chemotherapy.  He started seeing an oncologist in [facility].  He mentioned that it was called early so he has a good prognosis.  When asked regarding his plans for Christmas, he replied he probably would be eating with his mother.  He stated that his mother has been a rock for him and has supported him constantly. Writer asked if his mother can help him any further regarding his financial condition he replied he does not want to keep bothering her as she is already done a lot for him.  Writer asked the patient if he had any specific medication in mind that he wanted writer to prescribe him to help with his symptoms.  Patient stated that he thinks he is okay without any medication  for his mood for now and would just like to touch base in a few months to see how he is doing. He stated that he has been using some of his psychological techniques to help himself and he wants to see if they can help him further.  Past Psychiatric History: MDD, alcohol abuse, anxiety  Previous Psychotropic Medications: Yes   Substance Abuse History in the last 12 months:  Yes.    Consequences of Substance Abuse: Medical Consequences:  Health conditions Legal Consequences:  Hx of DWI Withdrawal Symptoms:   Cramps Diaphoresis Diarrhea Headaches Nausea Tremors  Past Medical History:  Past Medical History:  Diagnosis Date  . ADHD (attention deficit hyperactivity disorder)    Diagnosed during teenage years  . Alcohol use disorder    On average 14-21 drinks per week  . Alcoholic cirrhosis of liver    Per patient - condition is not related to moderate alcohol use but rather prolonged NSAID use and recent abuse.  . Allergy   . Calculus of gallbladder without cholecystitis without obstruction   . Chronic pain    Neck/lower back  . Degenerative disc disease, lumbar   . Diabetic polyneuropathy   . Essential hypertension, benign   . Generalized anxiety disorder   . Glaucoma    Per patient report  . Headache   . 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 Major depressive disorder   . Other spondylosis with radiculopathy, cervical region   . Retinopathy    Right eye; per patient report  . Type 2 diabetes mellitus with hyperglycemia     Past Surgical History:  Procedure Laterality Date  . CHOLECYSTECTOMY N/A 06/02/2018   Procedure: LAPAROSCOPIC CHOLECYSTECTOMY;  Surgeon: Vickie Epley, MD;  Location: [facility] ORS;  Service: General;  Laterality: N/A;  . KNEE ARTHROSCOPY     30 Years ago    Family Psychiatric History: Sister- bipolar disorder  Family History:  Family History  Problem Relation Age of Onset  . Aneurysm Father    . Multiple sclerosis Sister   . Emphysema Maternal Grandfather   . Diabetes Neg Hx     Social History:   Social History   Socioeconomic History  . Marital status: Single    Spouse name: Not on file  . Number of children: Not on file  . Years of education: 21  . Highest education level: Master's degree (e.g., MA, MS, MEng, MEd, MSW, MBA)  Occupational History  . Not on file  Tobacco Use  . Smoking status: Former Smoker    Quit date: 06/25/2003    Years since quitting: 17.1  . Smokeless tobacco: Never Used  . Tobacco comment: Currently vapes  Vaping Use  .  Vaping Use: Every day  Substance and Sexual Activity  . Alcohol use: Yes    Alcohol/week: 14.0 - 21.0 standard drinks    Types: 14 - 21 Cans of beer per week    Comment: 2-3 beers per night on average; sometimes up to 6  . Drug use: Yes    Types: Marijuana    Comment: 1/week to help sleep; edibles, does not smoke  . Sexual activity: Yes  Other Topics Concern  . Not on file  Social History Narrative  . Not on file   Social Determinants of Health   Financial Resource Strain: Not on file  Food Insecurity: Not on file  Transportation Needs: Not on file  Physical Activity: Not on file  Stress: Not on file  Social Connections: Not on file    Additional Social History:   Allergies:   Allergies  Allergen Reactions  . Hydrocodone Itching    Itching, face tingling, dizziness.  [name] [Oxycodone Hcl] Itching    Metabolic Disorder Labs: Lab Results  Component Value Date   HGBA1C 7.4 (H) 06/14/2020   MPG 192 01/12/2019   MPG 217.34 06/02/2018   No results found for: PROLACTIN Lab Results  Component Value Date   CHOL 122 06/14/2020   TRIG 77 06/14/2020   HDL 34 (L) 06/14/2020   CHOLHDL 3.6 06/14/2020   LDLCALC 72 06/14/2020   [place] 76 07/01/2019   Lab Results  Component Value Date   TSH 3.970 06/27/2020    Therapeutic Level Labs: No results found for: LITHIUM No results found for: CBMZ No  results found for: VALPROATE  Current Medications: Current Outpatient Medications  Medication Sig Dispense Refill  . Accu-Chek FastClix Lancets MISC Use as instructed to check blood sugar up to 3 times daily. (Patient taking differently: 1 each by Other route 2 (two) times daily.) 102 each 11  . ALPHA-LIPOIC ACID PO Take by mouth.    . Blood Glucose Monitoring Suppl (ACCU-CHEK GUIDE ME) w/Device KIT 1 kit by Does not apply route 3 (three) times daily. (Patient taking differently: 1 kit by Does not apply route 2 (two) times daily.) 1 kit 0  . calcium-vitamin D (OSCAL WITH D) 500-200 MG-UNIT tablet Take 1 tablet by mouth.    . dapagliflozin propanediol (FARXIGA) 5 MG TABS tablet Take 1 tablet (5 mg total) by mouth daily before breakfast. 90 tablet 3  . glucose blood (ACCU-CHEK GUIDE) test strip Use as instructed to check blood sugar up to 3 times daily. (Patient taking differently: 1 each by Other route 2 (two) times daily.) 100 each 12  . imatinib (GLEEVEC) 400 MG tablet Take 1 tablet (400 mg total) by mouth daily. Take with meals and large glass of water. 30 tablet 0  . insulin glargine (LANTUS SOLOSTAR) 100 UNIT/ML Solostar Pen Inject 22 Units into the skin every morning. And pen needles 1/day 15 mL 2  . lisinopril (ZESTRIL) 20 MG tablet Take 1 tablet (20 mg total) by mouth daily. 90 tablet 0  . metFORMIN (GLUCOPHAGE-XR) 500 MG 24 hr tablet Take 2 tablets (1,000 mg total) by mouth 2 (two) times daily with a meal. 120 tablet 2  . methocarbamol (ROBAXIN) 500 MG tablet Take 2 tablets (1,000 mg total) by mouth every 6 (six) hours as needed for muscle spasms. (Patient not taking: Reported on 08/09/2020) 90 tablet 0  . Multiple Vitamins-Minerals (MULTIVITAMIN WITH MINERALS) tablet Take 1 tablet by mouth daily.     . Nutritional Supplements (DHEA PO) Take 100  mg by mouth daily.    Marland Kitchen OVER THE COUNTER MEDICATION Black seed oil 2000per day    . topiramate (TOPAMAX) 50 MG tablet Take 1 tablet (50 mg total)  by mouth 2 (two) times daily. Please call your specialist to schedule follow up due. 60 tablet 0  . TRUEPLUS PEN NEEDLES 32G X 4 MM MISC 1 each by Other route daily.     Marland Kitchen zinc gluconate 50 MG tablet Take 50 mg by mouth daily.    . traMADol (ULTRAM) 50 MG tablet Take 1 tablet (50 mg total) by mouth every 12 (twelve) hours as needed for up to 15 days for severe pain. 30 tablet 0   No current facility-administered medications for this visit.    Musculoskeletal: Strength & Muscle Tone: within normal limits Gait & Station: unsteady Patient leans: N/A  Psychiatric Specialty Exam: Review of Systems  Blood pressure (!) 163/90, pulse 94, height _0  (1.88 m), weight 239 lb (108.4 kg), SpO2 98 %.Body mass index is 30.69 kg/m.  General Appearance: Fairly Groomed  Eye Contact:  Good  Speech:  Clear and Coherent and Normal Rate, Talkative +++  Volume:  Normal  Mood:  Expansive mood  Affect:  Congruent  Thought Process:  Goal Directed and Descriptions of Associations: Circumstantial  Orientation:  Full (Time, Place, and Person)  Thought Content:  Logical  Suicidal Thoughts:  No  Homicidal Thoughts:  No  Memory:  Immediate;   Good Recent;   Good  Judgement:  Fair  Insight:  Fair  Psychomotor Activity:  Normal  Concentration:  Concentration: Fair and Attention Span: Fair  Recall:  Good  Fund of Knowledge:Good  Language: Good  Akathisia:  Negative  Handed:  Right  AIMS (if indicated):  not done  Assets:  Communication Skills Desire for Improvement Financial Resources/Insurance Housing Social Support  ADL's:  Intact  Cognition: WNL  Sleep:  Poor due to chronic pain   Screenings: GAD-7   Flowsheet Row Office Visit from 05/23/2020 in [facility] Office Visit from 07/01/2019 in [place] Office Visit from 11/28/2018 in [facility] Office Visit from 10/06/2018 in [place] Office Visit from 08/06/2018 in [place]  Total GAD-7 Score 13 0 _1 PHQ2-9   [place] Office Visit from 06/14/2020 in [place] Visit from 05/23/2020 in [facility] Office Visit from 07/01/2019 in [place] from 02/24/2019 in [facility] [place] Office Visit from 11/28/2018 in [facility] [place]  PHQ-2 Total Score 6 6 0 3 0  PHQ-9 Total Score 22 23 0 15 3      Assessment and Plan: Patient continues to be talkative and overinclusive in his conversation.  It is very interesting to the writer that he spoke about other things and then mentioned his diagnosis of leukemia towards the end of the session. He continues to believe that he knows how to help himself better than anyone else therefore is not being prescribed any medication today.  1. 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 Anxiety 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   3. Alcohol use disorder, moderate, dependence ([place]) -Patient was encouraged to avoid drinking even when he does have money to purchase it.   F/up in 3 months.  [place] Crane, MD 12/14/20213:27 PM

## 2020-08-09 NOTE — Patient Instructions (Addendum)
[facility] [place] referral was placed.     Acute Back Pain, Adult Acute back pain is sudden and usually short-lived. It is often caused by an injury to the muscles and tissues in the back. The injury may result from:  A muscle or ligament getting overstretched or torn (strained). Ligaments are tissues that connect bones to each other. Lifting something improperly can cause a back strain.  Wear and tear (degeneration) of the spinal disks. Spinal disks are circular tissue that provides cushioning between the bones of the spine (vertebrae).  Twisting motions, such as while playing sports or doing yard work.  A hit to the back.  Arthritis. You may have a physical exam, lab tests, and imaging tests to find the cause of your pain. Acute back pain usually goes away with rest and home care. Follow these instructions at home: Managing pain, stiffness, and swelling  Take over-the-counter and prescription medicines only as told by your health care provider.  Your health care provider may recommend applying ice during the first 24-48 hours after your pain starts. To do this: ? Put ice in a plastic bag. ? Place a towel between your skin and the bag. ? Leave the ice on for 20 minutes, 2-3 times a day.  If directed, apply heat to the affected area as often as told by your health care provider. Use the heat source that your health care provider recommends, such as a moist heat pack or a heating pad. ? Place a towel between your skin and the heat source. ? Leave the heat on for 20-30 minutes. ? Remove the heat if your skin turns bright red. This is especially important if you are unable to feel pain, heat, or cold. You have a greater risk of getting burned. Activity   Do not stay in bed. Staying in bed for more than 1-2 days can delay your recovery.  Sit up and stand up straight. Avoid leaning forward when you sit, or hunching over when you stand. ? If you work at a desk, sit close to  it so you do not need to lean over. Keep your chin tucked in. Keep your neck drawn back, and keep your elbows bent at a right angle. Your arms should look like the letter "L." ? Sit high and close to the steering wheel when you drive. Add lower back (lumbar) support to your car seat, if needed.  Take short walks on even surfaces as soon as you are able. Try to increase the length of time you walk each day.  Do not sit, drive, or stand in one place for more than 30 minutes at a time. Sitting or standing for long periods of time can put stress on your back.  Do not drive or use heavy machinery while taking prescription pain medicine.  Use proper lifting techniques. When you bend and lift, use positions that put less stress on your back: ? [place] your knees. ? Keep the load close to your body. ? Avoid twisting.  Exercise regularly as told by your health care provider. Exercising helps your back heal faster and helps prevent back injuries by keeping muscles strong and flexible.  Work with a physical therapist to make a safe exercise program, as recommended by your health care provider. Do any exercises as told by your physical therapist. Lifestyle  Maintain a healthy weight. Extra weight puts stress on your back and makes it difficult to have good posture.  Avoid activities or situations that make  you feel anxious or stressed. Stress and anxiety increase muscle tension and can make back pain worse. Learn ways to manage anxiety and stress, such as through exercise. General instructions  Sleep on a firm mattress in a comfortable position. Try lying on your side with your knees slightly bent. If you lie on your back, put a pillow under your knees.  Follow your treatment plan as told by your health care provider. This may include: ? Cognitive or behavioral therapy. ? Acupuncture or massage therapy. ? Meditation or yoga. Contact a health care provider if:  You have pain that is not relieved with  rest or medicine.  You have increasing pain going down into your legs or buttocks.  Your pain does not improve after 2 weeks.  You have pain at night.  You lose weight without trying.  You have a fever or chills. Get help right away if:  You develop new bowel or bladder control problems.  You have unusual weakness or numbness in your arms or legs.  You develop nausea or vomiting.  You develop abdominal pain.  You feel faint. Summary  Acute back pain is sudden and usually short-lived.  Use proper lifting techniques. When you bend and lift, use positions that put less stress on your back.  Take over-the-counter and prescription medicines and apply heat or ice as directed by your health care provider. This information is not intended to replace advice given to you by your health care provider. Make sure you discuss any questions you have with your health care provider. Document Revised: 12/02/2018 Document Reviewed: 03/27/2017 Elsevier Patient Education  [place].    Acute Back Pain, Adult Acute back pain is sudden and usually short-lived. It is often caused by an injury to the muscles and tissues in the back. The injury may result from:  A muscle or ligament getting overstretched or torn (strained). Ligaments are tissues that connect bones to each other. Lifting something improperly can cause a back strain.  Wear and tear (degeneration) of the spinal disks. Spinal disks are circular tissue that provides cushioning between the bones of the spine (vertebrae).  Twisting motions, such as while playing sports or doing yard work.  A hit to the back.  Arthritis. You may have a physical exam, lab tests, and imaging tests to find the cause of your pain. Acute back pain usually goes away with rest and home care. Follow these instructions at home: Managing pain, stiffness, and swelling  Take over-the-counter and prescription medicines only as told by your health care  provider.  Your health care provider may recommend applying ice during the first 24-48 hours after your pain starts. To do this: ? Put ice in a plastic bag. ? Place a towel between your skin and the bag. ? Leave the ice on for 20 minutes, 2-3 times a day.  If directed, apply heat to the affected area as often as told by your health care provider. Use the heat source that your health care provider recommends, such as a moist heat pack or a heating pad. ? Place a towel between your skin and the heat source. ? Leave the heat on for 20-30 minutes. ? Remove the heat if your skin turns bright red. This is especially important if you are unable to feel pain, heat, or cold. You have a greater risk of getting burned. Activity   Do not stay in bed. Staying in bed for more than 1-2 days can delay your recovery.  Sit up and stand up straight. Avoid leaning forward when you sit, or hunching over when you stand. ? If you work at a desk, sit close to it so you do not need to lean over. Keep your chin tucked in. Keep your neck drawn back, and keep your elbows bent at a right angle. Your arms should look like the letter "L." ? Sit high and close to the steering wheel when you drive. Add lower back (lumbar) support to your car seat, if needed.  Take short walks on even surfaces as soon as you are able. Try to increase the length of time you walk each day.  Do not sit, drive, or stand in one place for more than 30 minutes at a time. Sitting or standing for long periods of time can put stress on your back.  Do not drive or use heavy machinery while taking prescription pain medicine.  Use proper lifting techniques. When you bend and lift, use positions that put less stress on your back: ? [place] your knees. ? Keep the load close to your body. ? Avoid twisting.  Exercise regularly as told by your health care provider. Exercising helps your back heal faster and helps prevent back injuries by keeping muscles  strong and flexible.  Work with a physical therapist to make a safe exercise program, as recommended by your health care provider. Do any exercises as told by your physical therapist. Lifestyle  Maintain a healthy weight. Extra weight puts stress on your back and makes it difficult to have good posture.  Avoid activities or situations that make you feel anxious or stressed. Stress and anxiety increase muscle tension and can make back pain worse. Learn ways to manage anxiety and stress, such as through exercise. General instructions  Sleep on a firm mattress in a comfortable position. Try lying on your side with your knees slightly bent. If you lie on your back, put a pillow under your knees.  Follow your treatment plan as told by your health care provider. This may include: ? Cognitive or behavioral therapy. ? Acupuncture or massage therapy. ? Meditation or yoga. Contact a health care provider if:  You have pain that is not relieved with rest or medicine.  You have increasing pain going down into your legs or buttocks.  Your pain does not improve after 2 weeks.  You have pain at night.  You lose weight without trying.  You have a fever or chills. Get help right away if:  You develop new bowel or bladder control problems.  You have unusual weakness or numbness in your arms or legs.  You develop nausea or vomiting.  You develop abdominal pain.  You feel faint. Summary  Acute back pain is sudden and usually short-lived.  Use proper lifting techniques. When you bend and lift, use positions that put less stress on your back.  Take over-the-counter and prescription medicines and apply heat or ice as directed by your health care provider. This information is not intended to replace advice given to you by your health care provider. Make sure you discuss any questions you have with your health care provider. Document Revised: 12/02/2018 Document Reviewed: 03/27/2017 Elsevier  Patient Education  [place].

## 2020-08-09 NOTE — Progress Notes (Signed)
Established patient visit   Patient: Gerald Olson   DOB: 28-Nov-1965   54 y.o. Male  MRN: [id number] Visit Date: 08/09/2020  Today's healthcare provider: Marcille Buffy, FNP   Chief Complaint  Patient presents with  . Back Pain   Subjective    HPI HPI    Back Pain    This is a new problem.  There was an injury that may have caused the pain.  Recent episode started 1 to 4 weeks ago (Patient reports 4 weeks ago he had a fall, a week after fall is when back pain began to trigger ).  The problem has been gradually worsening since onset.  Pain is thoracic spine.  Radiating to: Patient reports numbness down both side of his legs.  The symptoms are aggravated by bending, position and standing.  Symptoms are relieved by nothing.  Weakness in leg: Present.  Tingling in lower extremities: Present.       Last edited by Minette Headland, CMA on 08/09/2020  2:58 PM. (History)      Patient presents in office today to go over pain management.  He reports he fell over 4 weeks ago. He did not seek any care at that time.  He has some old oxycodone he took at home he reports and uses cannabis tincture.   Denies any loss of bowel or bladder control.  Denies saddle paresthesias.  Denies radiculopathy/ paresthesias.    He has seen PT for his for his back in the last few weeks, no imaging.   Patient  denies any fever, body aches,chills, rash, chest pain, shortness of breath, nausea, vomiting, or diarrhea. Denies dizziness, lightheadedness, pre syncopal or syncopal episodes.     Medications: Outpatient Medications Prior to Visit  Medication Sig  . Accu-Chek FastClix Lancets MISC Use as instructed to check blood sugar up to 3 times daily. (Patient taking differently: 1 each by Other route 2 (two) times daily.)  . ALPHA-LIPOIC ACID PO Take by mouth.  . Blood Glucose Monitoring Suppl (ACCU-CHEK GUIDE ME) w/Device KIT 1 kit by Does not apply route 3 (three) times daily. (Patient taking  differently: 1 kit by Does not apply route 2 (two) times daily.)  . calcium-vitamin D (OSCAL WITH D) 500-200 MG-UNIT tablet Take 1 tablet by mouth.  . dapagliflozin propanediol (FARXIGA) 5 MG TABS tablet Take 1 tablet (5 mg total) by mouth daily before breakfast.  . glucose blood (ACCU-CHEK GUIDE) test strip Use as instructed to check blood sugar up to 3 times daily. (Patient taking differently: 1 each by Other route 2 (two) times daily.)  . imatinib (GLEEVEC) 400 MG tablet Take 1 tablet (400 mg total) by mouth daily. Take with meals and large glass of water.  . insulin glargine (LANTUS SOLOSTAR) 100 UNIT/ML Solostar Pen Inject 22 Units into the skin every morning. And pen needles 1/day  . lisinopril (ZESTRIL) 20 MG tablet Take 1 tablet (20 mg total) by mouth daily.  . metFORMIN (GLUCOPHAGE-XR) 500 MG 24 hr tablet Take 2 tablets (1,000 mg total) by mouth 2 (two) times daily with a meal.  . Multiple Vitamins-Minerals (MULTIVITAMIN WITH MINERALS) tablet Take 1 tablet by mouth daily.   . Nutritional Supplements (DHEA PO) Take 100 mg by mouth daily.  [place] Kitchen OVER THE COUNTER MEDICATION Black seed oil 2000per day  . topiramate (TOPAMAX) 50 MG tablet Take 1 tablet (50 mg total) by mouth 2 (two) times daily. Please call your specialist to schedule follow  up due.  . TRUEPLUS PEN NEEDLES 32G X 4 MM MISC 1 each by Other route daily.   [place] Kitchen zinc gluconate 50 MG tablet Take 50 mg by mouth daily.  . methocarbamol (ROBAXIN) 500 MG tablet Take 2 tablets (1,000 mg total) by mouth every 6 (six) hours as needed for muscle spasms. (Patient not taking: Reported on 08/09/2020)   No facility-administered medications prior to visit.    Review of Systems  Constitutional: Negative.   HENT: Negative.   Respiratory: Negative.   Cardiovascular: Negative.   Gastrointestinal: Negative.   Musculoskeletal: Positive for back pain and gait problem (walks with cane chronic. reports chronic back pain for over three years. ).   Neurological: Negative for dizziness, tremors, seizures, syncope, facial asymmetry, speech difficulty, light-headedness, numbness and headaches.       Objective    BP (!) 149/92   Pulse 97   Temp 99.1 F (37.3 C) (Oral)   Resp 16   Wt 241 lb 3.2 oz (109.4 kg)   SpO2 99%   BMI 30.97 kg/m     Physical Exam Constitutional:      Appearance: Normal appearance.  HENT:     Mouth/Throat:     Mouth: Mucous membranes are moist.  Eyes:     Pupils: Pupils are equal, round, and reactive to light.  Cardiovascular:     Rate and Rhythm: Normal rate and regular rhythm.     Pulses: Normal pulses.     Heart sounds: Normal heart sounds.  Pulmonary:     Effort: Pulmonary effort is normal.  Abdominal:     General: There is no distension.     Palpations: Abdomen is soft.     Tenderness: There is no abdominal tenderness.  Musculoskeletal:        General: Normal range of motion.     Cervical back: Normal.     Thoracic back: Tenderness present.     Lumbar back: Tenderness present.       Back:     Comments: Always walks with cane.   No rash.   Skin:    General: Skin is warm.     Capillary Refill: Capillary refill takes less than 2 seconds.     Coloration: Skin is not jaundiced or pale.     Findings: No bruising, erythema, lesion or rash.  Neurological:     Mental Status: He is oriented to person, place, and time.     GCS: GCS eye subscore is 4. GCS verbal subscore is 5. GCS motor subscore is 6.     Motor: Motor function is intact.     Coordination: Coordination is intact.     Deep Tendon Reflexes:     Reflex Scores:      Bicep reflexes are 2+ on the right side and 2+ on the left side.      Patellar reflexes are 2+ on the right side and 2+ on the left side. Psychiatric:        Mood and Affect: Mood normal.        Behavior: Behavior normal.        Thought Content: Thought content normal.        Judgment: Judgment normal.       No results found for any visits on 08/09/20.   Assessment & Plan    Fall, initial encounter - Plan: DG Lumbar Spine Complete, DG Thoracic Spine W/Swimmers, DG Cervical Spine Complete, Drug Screen 12+Alcohol+CRT, Ur, Ambulatory referral to Orthopedic Surgery  Acute midline  thoracic back pain - Plan: Drug Screen 12+Alcohol+CRT, Ur, Ambulatory referral to Orthopedic Surgery  Other chronic pain - Plan: Ambulatory referral to Pain Clinic  Call if not heard from refferals within 1- 2 weeks. [facility] walk in if needed up until 7 pm. Keep follow with Dr Grayland Ormond.  Will not prescribe long term Ultram.   Orders Placed This Encounter  Procedures  . DG Lumbar Spine Complete  . DG Thoracic Spine W/Swimmers  . DG Cervical Spine Complete  . Drug Screen 12+Alcohol+CRT, Ur  . Ambulatory referral to Orthopedic Surgery  . Ambulatory referral to Pain Clinic    Return in about 3 months (around 11/07/2020), or if symptoms worsen or fail to improve, for at any time for any worsening symptoms, Go to Emergency room/ urgent care if worse.      The entirety of the information documented in the History of Present Illness, Review of Systems and Physical Exam were personally obtained by me. Portions of this information were initially documented by the CMA and reviewed by me for thoroughness and accuracy.    I spent 40 dedicated to the care of this patient on  the date of this encounter to include pre-visit review of records, face-to-face time with the  patient discussing The primary encounter diagnosis was Fall, initial encounter. Diagnoses of Acute midline thoracic back pain and Other chronic pain were also pertinent to this visit. and post visit ordering of testing and review of imaging. referrals placed.    Marcille Buffy, [place] [phone] (phone) [phone] (fax)  [place]

## 2020-08-10 ENCOUNTER — Ambulatory Visit: Payer: Medicaid Other

## 2020-08-10 ENCOUNTER — Other Ambulatory Visit: Payer: Self-pay

## 2020-08-10 DIAGNOSIS — G8929 Other chronic pain: Secondary | ICD-10-CM | POA: Diagnosis not present

## 2020-08-10 DIAGNOSIS — R262 Difficulty in walking, not elsewhere classified: Secondary | ICD-10-CM | POA: Diagnosis not present

## 2020-08-10 DIAGNOSIS — M542 Cervicalgia: Secondary | ICD-10-CM | POA: Diagnosis not present

## 2020-08-10 DIAGNOSIS — M6281 Muscle weakness (generalized): Secondary | ICD-10-CM

## 2020-08-10 DIAGNOSIS — M5442 Lumbago with sciatica, left side: Secondary | ICD-10-CM | POA: Diagnosis not present

## 2020-08-10 NOTE — Therapy (Signed)
[place], [place], [address] Phone: [phone]   Fax:  [phone]  Physical Therapy Treatment  Patient Details  Name: Gerald Olson MRN: [id number] Date of Birth: 07-Mar-1966 Referring Provider (PT): Marybelle Killings, MD    Encounter Date: 08/10/2020   PT End of Session - 08/10/20 1702    Visit Number 4    Number of Visits 13    Date for PT Re-Evaluation 09/10/20    Authorization Type Medicaid - [employer]    Authorization Time Period authorization pending    PT Start Time 0354    PT Stop Time 6568    PT Time Calculation (min) 44 min    Activity Tolerance Patient limited by pain;Patient tolerated treatment well    Behavior During Therapy [facility] for tasks assessed/performed           Past Medical History:  Diagnosis Date   ADHD (attention deficit hyperactivity disorder)    Diagnosed during teenage years   Alcohol use disorder    On average 14-21 drinks per week   Alcoholic cirrhosis of liver    Per patient - condition is not related to moderate alcohol use but rather prolonged NSAID use and recent abuse.   Allergy    Calculus of gallbladder without cholecystitis without obstruction    Chronic pain    Neck/lower back   Degenerative disc disease, lumbar    Diabetic polyneuropathy    Essential hypertension, benign    Generalized anxiety disorder    Glaucoma    Per patient report   Headache    History of concussion 2006   IED exposure while serving as [employer] in [place]   Major depressive disorder    Other spondylosis with radiculopathy, cervical region    Retinopathy    Right eye; per patient report   Type 2 diabetes mellitus with hyperglycemia     Past Surgical History:  Procedure Laterality Date   CHOLECYSTECTOMY N/A 06/02/2018   Procedure: LAPAROSCOPIC CHOLECYSTECTOMY;  Surgeon: Vickie Epley, MD;  Location: [facility] ORS;  Service: General;  Laterality: N/A;    KNEE ARTHROSCOPY     30 Years ago    There were no vitals filed for this visit.   Subjective Assessment - 08/10/20 1633    Subjective Patient reports he feels "horrible" and that his midback pain has worsened since last week. He has an appointment with orthopedic specialist on Friday for this new onset of pain. He feels that he can't even find a comfortable position. He feels that the sciatic pain in his LLE is "loosening up a bit" since beginning PT. He denies any falls since beginning PT and is working on obtaining a cane that is an appropriate height.    Limitations Standing;Walking;House hold activities;Sitting    Patient Stated Goals Reducing falls/not falling, being able to do daily activities    Currently in Pain? Yes    Pain Score 10-Worst pain ever    Pain Location Back    Pain Orientation Mid;Lower    Pain Descriptors / Indicators Burning;Dull    Pain Type Chronic pain    Pain Onset More than a month ago    Pain Frequency Constant    Multiple Pain Sites Yes    Pain Score 8    Pain Location Neck    Pain Orientation Posterior    Pain Descriptors / Indicators Aching;Headache    Pain Type Chronic pain    Pain Onset More than  a month ago    Pain Frequency Constant              [facility] PT Assessment - 08/10/20 0001      AROM   Lumbar Flexion Mid tibia, slow movement, difficulty returning to neutral    Lumbar Extension painful to attempt    Lumbar - Right Side Bend ~2 inches above lateral joint line    Lumbar - Left Side Bend lateral joint line    Lumbar - Right Rotation <50%    Lumbar - Left Rotation <50% sciatic pain down LLE, increased movement from T-spine compared to L-spine                         [facility] Adult PT Treatment/Exercise - 08/10/20 0001      Self-Care   Other Self-Care Comments  see patient education      Lumbar Exercises: Seated   Other Seated Lumbar Exercises seated trunk flexion 1 x 10      Lumbar Exercises: Supine   AB Set  Limitations 1 x 10    Bent Knee Raise Limitations 2 x 10    Bridge Limitations 2 x 10                  PT Education - 08/10/20 1730    Education Details education on lumbar anatomy, education on lumbar AROM findings, postureal education. Review on importance of proper fit of SPC. Education on updated HEP.    Person(s) Educated Patient    Methods Explanation;Demonstration;Tactile cues;Verbal cues;Handout    Comprehension Verbalized understanding;Returned demonstration;Verbal cues required;Need further instruction            PT Short Term Goals - 07/28/20 2003      PT SHORT TERM GOAL #1   Title Pt will be independent with initial HEP.    Baseline Pt provided initial HEP at evaluation 07/28/2020.    Time 3    Period Weeks    Status New    Target Date 08/18/20      PT SHORT TERM GOAL #2   Title Patient will report no falls for at least 1 consecutive week.    Baseline Pt falls and trips frequently due to symptoms in back and LLE.    Time 3    Period Weeks    Status New    Target Date 08/18/20      PT SHORT TERM GOAL #3   Title Patient's LLE MMT will improve to at least 4/5 grossly.    Baseline 3+/5    Time 3    Period Weeks    Status New    Target Date 08/18/20      PT SHORT TERM GOAL #4   Title Pt will be able to ambulate 50 feet using SPC including at least 1 left turn without falling or LOB.    Baseline Pt has exacerbation of symptoms with turning/rotating left, which is when he typically falls.    Time 3    Period Weeks    Status New    Target Date 08/18/20             PT Long Term Goals - 07/28/20 2007      PT LONG TERM GOAL #1   Title Pt will be independent with advanced HEP.    Baseline Pt provided initial HEP at evaluation 07/28/2020.    Time 6    Period Weeks    Status New  Target Date 09/08/20      PT LONG TERM GOAL #2   Title Patient will report no falls for at least 3 consecutive weeks.    Baseline Pt falls and trips frequently due to  symptoms in back and LLE.    Time 6    Period Weeks    Status New    Target Date 09/08/20      PT LONG TERM GOAL #3   Title Patient's LLE MMT will improve to at least 4+/5 grossly.    Baseline 3+/5    Time 6    Period Weeks    Status New    Target Date 09/08/20      PT LONG TERM GOAL #4   Title Pt will be able to ambulate 150 feet using SPC including at least 2 left turns without falling or LOB.    Baseline Pt has exacerbation of symptoms with turning/rotating left, which is when he typically falls.    Time 6    Period Weeks    Status New    Target Date 09/08/20                 Plan - 08/10/20 1708    Clinical Impression Statement Patient demonstrates modest improvement in trunk flexion and lateral flexion AROM compared to initial evaluation, though has significant pain with lumbar extension and Lt rotation with reports of shooting pain down LLE with Lt rotation causing LOB with patient requiring assistance to regain balance.Patient has improved tolerance to activities in supine reporting a slightdecrease in low back pain with core stabilization exercises. He requires moderate cues to decrease anterior pelvic tilt with supine activity with ability to correct once cued.    Personal Factors and Comorbidities Age;Time since onset of injury/illness/exacerbation;Past/Current Experience;Comorbidity 1;Comorbidity 3+;Comorbidity 2    Comorbidities see extensive PMH above    PT Treatment/Interventions ADLs/Self Care Home Management;Aquatic Therapy;Cryotherapy;Iontophoresis 4mg /ml Dexamethasone;Moist Heat;Neuromuscular re-education;Balance training;Therapeutic exercise;Therapeutic activities;Functional mobility training;Stair training;Gait training;Patient/family education;Energy conservation;Manual techniques;Dry needling;Passive range of motion;Taping;Electrical Stimulation    PT Next Visit Plan Progress dynamic core stabilization as tolerated. Hip strengthening as tolerated.    PT Home  Exercise Plan Access Code [email or web address]    Consulted and Agree with Plan of Care Patient           Patient will benefit from skilled therapeutic intervention in order to improve the following deficits and impairments:  Difficulty walking,Abnormal gait,Decreased activity tolerance,Decreased balance,Decreased mobility,Decreased strength,Impaired sensation,Pain,Increased muscle spasms,Decreased endurance,Decreased range of motion,Improper body mechanics,Postural dysfunction  Visit Diagnosis: Chronic left-sided low back pain with left-sided sciatica  Difficulty in walking, not elsewhere classified  Muscle weakness (generalized)  Cervicalgia     Problem List Patient Active Problem List   Diagnosis Date Noted   Fall 08/09/2020   Acute midline thoracic back pain 08/09/2020   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 08/09/2020   No-show for appointment 08/04/2020   Occult blood in stools 06/15/2020   Mesenteric lymphadenopathy 06/14/2020   Splenomegaly- mild  06/14/2020   Screening for blood or protein in urine 06/14/2020   Elevated blood pressure reading 06/14/2020   History of cannabis abuse 06/14/2020   Recurrent major depressive disorder, in partial remission ([place]) 06/09/2020   Anxiety 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 06/09/2020   Alcohol use disorder, moderate, dependence ([place]) 06/09/2020   Spinal stenosis of cervical region 06/03/2020   Frequent falls 05/23/2020   Foraminal stenosis of cervical region 05/09/2020   Diabetic polyneuropathy    ADHD (attention  deficit hyperactivity disorder)    Generalized anxiety disorder    Major depressive disorder    Other spondylosis with radiculopathy, cervical region 09/22/2019   Low back pain 07/28/2019   Essential hypertension, benign 15/18/3437   Alcoholic cirrhosis of liver 06/02/2018   Type 2 diabetes mellitus with hyperglycemia 04/25/2018   Alcohol use disorder, mild, abuse  04/25/2018   Gwendolyn Grant, PT, DPT, ATC 08/10/20 5:36 PM  [place] [facility] [address] [place], [place], [address] Phone: [phone]   Fax:  [phone]  Name: Gerald Olson MRN: [id number] Date of Birth: 1966/05/16

## 2020-08-12 DIAGNOSIS — M546 Pain in thoracic spine: Secondary | ICD-10-CM | POA: Diagnosis not present

## 2020-08-12 DIAGNOSIS — M545 Low back pain, unspecified: Secondary | ICD-10-CM | POA: Diagnosis not present

## 2020-08-12 DIAGNOSIS — M542 Cervicalgia: Secondary | ICD-10-CM | POA: Diagnosis not present

## 2020-08-15 ENCOUNTER — Ambulatory Visit: Payer: Medicaid Other

## 2020-08-15 ENCOUNTER — Other Ambulatory Visit: Payer: Self-pay

## 2020-08-15 DIAGNOSIS — R262 Difficulty in walking, not elsewhere classified: Secondary | ICD-10-CM

## 2020-08-15 DIAGNOSIS — M5442 Lumbago with sciatica, left side: Secondary | ICD-10-CM

## 2020-08-15 DIAGNOSIS — M542 Cervicalgia: Secondary | ICD-10-CM | POA: Diagnosis not present

## 2020-08-15 DIAGNOSIS — M6281 Muscle weakness (generalized): Secondary | ICD-10-CM | POA: Diagnosis not present

## 2020-08-15 DIAGNOSIS — G8929 Other chronic pain: Secondary | ICD-10-CM | POA: Diagnosis not present

## 2020-08-15 NOTE — Therapy (Signed)
[place], [place], [address] Phone: [phone]   Fax:  [phone]  Physical Therapy Treatment  Patient Details  Name: Gerald Olson MRN: [id number] Date of Birth: 11-Oct-1965 Referring Provider (PT): Marybelle Killings, MD    Encounter Date: 08/15/2020   PT End of Session - 08/15/20 1623    Visit Number 5    Number of Visits 13    Date for PT Re-Evaluation 09/10/20    Authorization Type Medicaid - [employer]    Authorization Time Period authorization pending    PT Start Time 5093    PT Stop Time 1630    PT Time Calculation (min) 45 min    Activity Tolerance Patient tolerated treatment well    Behavior During Therapy [facility] for tasks assessed/performed           Past Medical History:  Diagnosis Date  . ADHD (attention deficit hyperactivity disorder)    Diagnosed during teenage years  . Alcohol use disorder    On average 14-21 drinks per week  . Alcoholic cirrhosis of liver    Per patient - condition is not related to moderate alcohol use but rather prolonged NSAID use and recent abuse.  . Allergy   . Calculus of gallbladder without cholecystitis without obstruction   . Chronic pain    Neck/lower back  . Degenerative disc disease, lumbar   . Diabetic polyneuropathy   . Essential hypertension, benign   . Generalized anxiety disorder   . Glaucoma    Per patient report  . Headache   . 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 Major depressive disorder   . Other spondylosis with radiculopathy, cervical region   . Retinopathy    Right eye; per patient report  . Type 2 diabetes mellitus with hyperglycemia     Past Surgical History:  Procedure Laterality Date  . CHOLECYSTECTOMY N/A 06/02/2018   Procedure: LAPAROSCOPIC CHOLECYSTECTOMY;  Surgeon: Vickie Epley, MD;  Location: [facility] ORS;  Service: General;  Laterality: N/A;  . KNEE ARTHROSCOPY      30 Years ago    There were no vitals filed for this visit.   Subjective Assessment - 08/15/20 1550    Subjective Patient reports the low back "is hurting, but not as much as the midback." He recalls sneezing yesterday that caused a pop in his midback that increased his midback pain. Patient had an appointment with orthopedic specialist last Friday and is awaiting appointment for MRI.    Limitations Standing;Walking;House hold activities;Sitting    How long can you sit comfortably? "15-30 minutes before getting up, walking, stretching"    How long can you stand comfortably? "There's physical pain then also fatigue standing for a while - I would say 20 minutes" - takes seated rest breaks while cooking    How long can you walk comfortably? "About 20 minutes at the most"    Patient Stated Goals Reducing falls/not falling, being able to do daily activities    Currently in Pain? Yes    Pain Score 6     Pain Location Back    Pain Orientation Mid;Lower    Pain Descriptors / Indicators Burning;Dull    Pain Type Chronic pain    Pain Onset More than a month ago    Pain Frequency Constant    Multiple Pain Sites Yes    Pain Score 5    Pain Location Neck  Pain Orientation Posterior    Pain Descriptors / Indicators Tightness    Pain Type Chronic pain    Pain Onset More than a month ago                             [facility] Adult PT Treatment/Exercise - 08/15/20 0001      Lumbar Exercises: Standing   Heel Raises Limitations 1 x 20      Lumbar Exercises: Seated   Other Seated Lumbar Exercises seated march 2 x 20      Lumbar Exercises: Supine   Clam Limitations 2 x 15 green TB    Straight Leg Raises Limitations 2 x 10 bilateral    Other Supine Lumbar Exercises diaphragmatic breathing 1 x 10      Lumbar Exercises: Sidelying   Clam Limitations 2 x 10                  PT Education - 08/15/20 1644    Education Details Education on updated HEP.    Person(s) Educated  Patient    Methods Explanation;Demonstration    Comprehension Verbalized understanding;Returned demonstration            PT Short Term Goals - 07/28/20 2003      PT SHORT TERM GOAL #1   Title Pt will be independent with initial HEP.    Baseline Pt provided initial HEP at evaluation 07/28/2020.    Time 3    Period Weeks    Status New    Target Date 08/18/20      PT SHORT TERM GOAL #2   Title Patient will report no falls for at least 1 consecutive week.    Baseline Pt falls and trips frequently due to symptoms in back and LLE.    Time 3    Period Weeks    Status New    Target Date 08/18/20      PT SHORT TERM GOAL #3   Title Patient's LLE MMT will improve to at least 4/5 grossly.    Baseline 3+/5    Time 3    Period Weeks    Status New    Target Date 08/18/20      PT SHORT TERM GOAL #4   Title Pt will be able to ambulate 50 feet using SPC including at least 1 left turn without falling or LOB.    Baseline Pt has exacerbation of symptoms with turning/rotating left, which is when he typically falls.    Time 3    Period Weeks    Status New    Target Date 08/18/20             PT Long Term Goals - 07/28/20 2007      PT LONG TERM GOAL #1   Title Pt will be independent with advanced HEP.    Baseline Pt provided initial HEP at evaluation 07/28/2020.    Time 6    Period Weeks    Status New    Target Date 09/08/20      PT LONG TERM GOAL #2   Title Patient will report no falls for at least 3 consecutive weeks.    Baseline Pt falls and trips frequently due to symptoms in back and LLE.    Time 6    Period Weeks    Status New    Target Date 09/08/20      PT LONG TERM GOAL #3   Title  Patient's LLE MMT will improve to at least 4+/5 grossly.    Baseline 3+/5    Time 6    Period Weeks    Status New    Target Date 09/08/20      PT LONG TERM GOAL #4   Title Pt will be able to ambulate 150 feet using SPC including at least 2 left turns without falling or LOB.     Baseline Pt has exacerbation of symptoms with turning/rotating left, which is when he typically falls.    Time 6    Period Weeks    Status New    Target Date 09/08/20                 Plan - 08/15/20 1602    Clinical Impression Statement Patient tolerated session well today with progression of isolated hip strengthening with patient notably challenged with targeted hip abductor and flexor strengthening exercises on the LLE demonstrating visible shaking and difficulty maintaining pelvic alignment. Patient's gait mechanics are improving, though limited hip flexion and extension noted throughout gait cycle on the LLE. No change in patient's back pain at conclusion of session.    Personal Factors and Comorbidities Age;Time since onset of injury/illness/exacerbation;Past/Current Experience;Comorbidity 1;Comorbidity 3+;Comorbidity 2    Comorbidities see extensive PMH above    PT Treatment/Interventions ADLs/Self Care Home Management;Aquatic Therapy;Cryotherapy;Iontophoresis 4mg /ml Dexamethasone;Moist Heat;Neuromuscular re-education;Balance training;Therapeutic exercise;Therapeutic activities;Functional mobility training;Stair training;Gait training;Patient/family education;Energy conservation;Manual techniques;Dry needling;Passive range of motion;Taping;Electrical Stimulation    PT Next Visit Plan Progress hip strengthening. Introduce standing activity as tolerated.    PT Home Exercise Plan Access Code [email or web address]    Consulted and Agree with Plan of Care Patient           Patient will benefit from skilled therapeutic intervention in order to improve the following deficits and impairments:  Difficulty walking,Abnormal gait,Decreased activity tolerance,Decreased balance,Decreased mobility,Decreased strength,Impaired sensation,Pain,Increased muscle spasms,Decreased endurance,Decreased range of motion,Improper body mechanics,Postural dysfunction  Visit Diagnosis: Chronic left-sided low back pain  with left-sided sciatica  Difficulty in walking, not elsewhere classified  Muscle weakness (generalized)  Cervicalgia     Problem List Patient Active Problem List   Diagnosis Date Noted  . Fall 08/09/2020  . Acute midline thoracic back pain 08/09/2020  . 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 No-show for appointment 08/04/2020  . Occult blood in stools 06/15/2020  . Mesenteric lymphadenopathy 06/14/2020  . Splenomegaly- mild  06/14/2020  . Screening for blood or protein in urine 06/14/2020  . Elevated blood pressure reading 06/14/2020  . History of cannabis abuse 06/14/2020  . Recurrent major depressive disorder, in partial remission ([place]) 06/09/2020  . Anxiety 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 06/09/2020  . Alcohol use disorder, moderate, dependence ([place]) 06/09/2020  . Spinal stenosis of cervical region 06/03/2020  . Frequent falls 05/23/2020  . Foraminal stenosis of cervical region 05/09/2020  . Diabetic polyneuropathy   . ADHD (attention deficit hyperactivity disorder)   . Generalized anxiety disorder   . Major depressive disorder   . Other spondylosis with radiculopathy, cervical region 09/22/2019  . Low back pain 07/28/2019  . Essential hypertension, benign 01/28/2019  . Alcoholic cirrhosis of liver 06/02/2018  . Type 2 diabetes mellitus with hyperglycemia 04/25/2018  . Alcohol use disorder, mild, abuse 04/25/2018   Gwendolyn Grant, PT, DPT, ATC 08/15/20 4:45 PM  [place] [facility] [address] [place], [place], [address] Phone: [phone]   Fax:  [phone]  Name: Gerald Olson  MRN: [id number] Date of Birth: 1965-12-26

## 2020-08-16 ENCOUNTER — Other Ambulatory Visit: Payer: Self-pay | Admitting: Oncology

## 2020-08-16 DIAGNOSIS — C9210 Chronic myeloid leukemia, BCR/ABL-positive, not having achieved remission: Secondary | ICD-10-CM

## 2020-08-17 ENCOUNTER — Other Ambulatory Visit: Payer: Self-pay

## 2020-08-17 ENCOUNTER — Ambulatory Visit: Payer: Medicaid Other

## 2020-08-17 DIAGNOSIS — R262 Difficulty in walking, not elsewhere classified: Secondary | ICD-10-CM | POA: Diagnosis not present

## 2020-08-17 DIAGNOSIS — G8929 Other chronic pain: Secondary | ICD-10-CM

## 2020-08-17 DIAGNOSIS — M542 Cervicalgia: Secondary | ICD-10-CM | POA: Diagnosis not present

## 2020-08-17 DIAGNOSIS — M6281 Muscle weakness (generalized): Secondary | ICD-10-CM

## 2020-08-17 DIAGNOSIS — M5442 Lumbago with sciatica, left side: Secondary | ICD-10-CM | POA: Diagnosis not present

## 2020-08-17 NOTE — Therapy (Signed)
[facility] [address] [place], [place], [address] Phone: [phone]   Fax:  [phone]  Physical Therapy Treatment  Patient Details  Name: Gerald Olson MRN: [id number] Date of Birth: 1965-10-30 Referring Provider (PT): Eldred Manges, MD    Encounter Date: 08/17/2020   PT End of Session - 08/17/20 1605    Visit Number 6    Number of Visits 13    Date for PT Re-Evaluation 09/10/20    Authorization Type Medicaid - [employer]    Authorization Time Period authorization pending    PT Start Time 1545    PT Stop Time 1630    PT Time Calculation (min) 45 min    Activity Tolerance Patient tolerated treatment well    Behavior During Therapy [facility] for tasks assessed/performed           Past Medical History:  Diagnosis Date  . ADHD (attention deficit hyperactivity disorder)    Diagnosed during teenage years  . Alcohol use disorder    On average 14-21 drinks per week  . Alcoholic cirrhosis of liver    Per patient - condition is not related to moderate alcohol use but rather prolonged NSAID use and recent abuse.  . Allergy   . Calculus of gallbladder without cholecystitis without obstruction   . Chronic pain    Neck/lower back  . Degenerative disc disease, lumbar   . Diabetic polyneuropathy   . Essential hypertension, benign   . Generalized anxiety disorder   . Glaucoma    Per patient report  . Headache   . 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 Major depressive disorder   . Other spondylosis with radiculopathy, cervical region   . Retinopathy    Right eye; per patient report  . Type 2 diabetes mellitus with hyperglycemia     Past Surgical History:  Procedure Laterality Date  . CHOLECYSTECTOMY N/A 06/02/2018   Procedure: LAPAROSCOPIC CHOLECYSTECTOMY;  Surgeon: Ancil Linsey, MD;  Location: [facility] ORS;  Service: General;  Laterality: N/A;  . KNEE ARTHROSCOPY      30 Years ago    There were no vitals filed for this visit.   Subjective Assessment - 08/17/20 1549    Subjective He reports soreness in all of his leg muscles that began about midnight after last session and he was so sore yesterday that he didn't even get out of bed and is a little slow moving today.He attributes this to overdoing with his HEP. Patient reports his midback pain is horrible, but he thinks he is getting used to his low back pain because the midback pain is so severe. His neck is really stiff.    Limitations Standing;Walking;House hold activities;Sitting    How long can you sit comfortably? "15-30 minutes before getting up, walking, stretching"    How long can you stand comfortably? "There's physical pain then also fatigue standing for a while - I would say 20 minutes" - takes seated rest breaks while cooking    How long can you walk comfortably? "About 20 minutes at the most"    Patient Stated Goals Reducing falls/not falling, being able to do daily activities    Currently in Pain? Yes    Pain Score 9     Pain Location Back    Pain Orientation Mid;Lower    Pain Descriptors / Indicators Burning;Dull;Sharp    Pain Type Chronic pain    Pain Onset  More than a month ago    Multiple Pain Sites No    Pain Onset More than a month ago              [facility] PT Assessment - 08/17/20 0001      Strength   Overall Strength Comments Lt hip flexion 4-/5, Lt hip abduction 4-/5 Lt knee extension and flexion 4+/5; RLE strength grossly 4+/5                         [facility] Adult PT Treatment/Exercise - 08/17/20 0001      Self-Care   Other Self-Care Comments  see patient education      Lumbar Exercises: Seated   Other Seated Lumbar Exercises seated march 2 x 10    Other Seated Lumbar Exercises seated trunk flexion, T-spine biased 1x  10      Lumbar Exercises: Supine   AB Set Limitations 2 x 10 with marching    Straight Leg Raises Limitations 2 x 10 bilateral    Other  Supine Lumbar Exercises figure 4 stretch 60 sec each                  PT Education - 08/17/20 1635    Education Details Review of current HEP including prescribed amount of sets/reps. Recommendation to f/u with orthopedic specialist as soon as possible regarding ongoing thoracic pain.    Person(s) Educated Patient    Methods Explanation;Demonstration    Comprehension Verbalized understanding;Returned demonstration            PT Short Term Goals - 07/28/20 2003      PT SHORT TERM GOAL #1   Title Pt will be independent with initial HEP.    Baseline Pt provided initial HEP at evaluation 07/28/2020.    Time 3    Period Weeks    Status New    Target Date 08/18/20      PT SHORT TERM GOAL #2   Title Patient will report no falls for at least 1 consecutive week.    Baseline Pt falls and trips frequently due to symptoms in back and LLE.    Time 3    Period Weeks    Status New    Target Date 08/18/20      PT SHORT TERM GOAL #3   Title Patient's LLE MMT will improve to at least 4/5 grossly.    Baseline 3+/5    Time 3    Period Weeks    Status New    Target Date 08/18/20      PT SHORT TERM GOAL #4   Title Pt will be able to ambulate 50 feet using SPC including at least 1 left turn without falling or LOB.    Baseline Pt has exacerbation of symptoms with turning/rotating left, which is when he typically falls.    Time 3    Period Weeks    Status New    Target Date 08/18/20             PT Long Term Goals - 07/28/20 2007      PT LONG TERM GOAL #1   Title Pt will be independent with advanced HEP.    Baseline Pt provided initial HEP at evaluation 07/28/2020.    Time 6    Period Weeks    Status New    Target Date 09/08/20      PT LONG TERM GOAL #2   Title Patient will report  no falls for at least 3 consecutive weeks.    Baseline Pt falls and trips frequently due to symptoms in back and LLE.    Time 6    Period Weeks    Status New    Target Date 09/08/20       PT LONG TERM GOAL #3   Title Patient's LLE MMT will improve to at least 4+/5 grossly.    Baseline 3+/5    Time 6    Period Weeks    Status New    Target Date 09/08/20      PT LONG TERM GOAL #4   Title Pt will be able to ambulate 150 feet using SPC including at least 2 left turns without falling or LOB.    Baseline Pt has exacerbation of symptoms with turning/rotating left, which is when he typically falls.    Time 6    Period Weeks    Status New    Target Date 09/08/20                 Plan - 08/17/20 1610    Clinical Impression Statement Patient's LE strength is gradually improving since initial evaluation with significant improvement in Lt knee strength noted, though patient has lingering Lt hip weakness requiring continued emphasis at future sessions. Fair tolerance to hip and core strengthening with patient having intermittent quad and calf cramping in BLE throughout session. Patient reported onset of sharp R-sided thoracic pain during session that was moderately relieved with rest. Due to patient's continued complaints of significant thoracic pain that have remained unchanged for the past two weeks patient was encouraged to send imaging and follow-up with orthopedic specialist as soon as possible. Patient verbalizes understanding of need to follow-up with specialist regarding his acute midback pain.    Personal Factors and Comorbidities Age;Time since onset of injury/illness/exacerbation;Past/Current Experience;Comorbidity 1;Comorbidity 3+;Comorbidity 2    Comorbidities see extensive PMH above    PT Treatment/Interventions ADLs/Self Care Home Management;Aquatic Therapy;Cryotherapy;Iontophoresis 4mg /ml Dexamethasone;Moist Heat;Neuromuscular re-education;Balance training;Therapeutic exercise;Therapeutic activities;Functional mobility training;Stair training;Gait training;Patient/family education;Energy conservation;Manual techniques;Dry needling;Passive range of  motion;Taping;Electrical Stimulation    PT Next Visit Plan Progress hip strengthening. Introduce standing activity as tolerated.    PT Home Exercise Plan Access Code    Consulted and Agree with Plan of Care Patient           Patient will benefit from skilled therapeutic intervention in order to improve the following deficits and impairments:  Difficulty walking,Abnormal gait,Decreased activity tolerance,Decreased balance,Decreased mobility,Decreased strength,Impaired sensation,Pain,Increased muscle spasms,Decreased endurance,Decreased range of motion,Improper body mechanics,Postural dysfunction  Visit Diagnosis: Chronic left-sided low back pain with left-sided sciatica  Difficulty in walking, not elsewhere classified  Muscle weakness (generalized)  Cervicalgia     Problem List Patient Active Problem List   Diagnosis Date Noted  . Fall 08/09/2020  . Acute midline thoracic back pain 08/09/2020  . 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 No-show for appointment 08/04/2020  . Occult blood in stools 06/15/2020  . Mesenteric lymphadenopathy 06/14/2020  . Splenomegaly- mild  06/14/2020  . Screening for blood or protein in urine 06/14/2020  . Elevated blood pressure reading 06/14/2020  . History of cannabis abuse 06/14/2020  . Recurrent major depressive disorder, in partial remission (HCC) 06/09/2020  . Anxiety 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 06/09/2020  . Alcohol use disorder, moderate, dependence (HCC) 06/09/2020  . Spinal stenosis of cervical region 06/03/2020  . Frequent falls 05/23/2020  . Foraminal stenosis of cervical  region 05/09/2020  . Diabetic polyneuropathy   . ADHD (attention deficit hyperactivity disorder)   . Generalized anxiety disorder   . Major depressive disorder   . Other spondylosis with radiculopathy, cervical region 09/22/2019  . Low back pain 07/28/2019  . Essential hypertension, benign 01/28/2019   . Alcoholic cirrhosis of liver 06/02/2018  . Type 2 diabetes mellitus with hyperglycemia 04/25/2018  . Alcohol use disorder, mild, abuse 04/25/2018   Gwendolyn Grant, PT, DPT, ATC 08/17/20 4:39 PM [facility] [address] [place], [place], [address] Phone: [phone]   Fax:  [phone]  Name: Gerald Olson MRN: [id number] Date of Birth: May 27, 1966

## 2020-08-22 ENCOUNTER — Ambulatory Visit: Payer: Medicaid Other

## 2020-08-22 ENCOUNTER — Other Ambulatory Visit: Payer: Self-pay

## 2020-08-22 DIAGNOSIS — M6281 Muscle weakness (generalized): Secondary | ICD-10-CM | POA: Diagnosis not present

## 2020-08-22 DIAGNOSIS — R262 Difficulty in walking, not elsewhere classified: Secondary | ICD-10-CM

## 2020-08-22 DIAGNOSIS — M542 Cervicalgia: Secondary | ICD-10-CM

## 2020-08-22 DIAGNOSIS — M5442 Lumbago with sciatica, left side: Secondary | ICD-10-CM

## 2020-08-22 DIAGNOSIS — G8929 Other chronic pain: Secondary | ICD-10-CM | POA: Diagnosis not present

## 2020-08-22 MED FILL — IMATINIB MESYLATE 400 MG TA: 400 | 30 days supply | Qty: 30 | Fill #0

## 2020-08-22 NOTE — Therapy (Addendum)
[place], [place], [address] Phone: [phone]   Fax:  [phone]  Physical Therapy Treatment/Re-eval/Discharge   Patient Details  Name: Gerald Olson MRN: [id number] Date of Birth: 07/06/1966 Referring Provider (PT): Marybelle Killings, MD    Encounter Date: 08/22/2020   PT End of Session - 08/22/20 1636    Visit Number 7    Number of Visits 13    Date for PT Re-Evaluation 09/10/20    Authorization Type Medicaid - [employer]    Authorization Time Period authorization pending    PT Start Time 4818    PT Stop Time 1630    PT Time Calculation (min) 45 min    Activity Tolerance Patient tolerated treatment well    Behavior During Therapy [facility] for tasks assessed/performed           Past Medical History:  Diagnosis Date  . ADHD (attention deficit hyperactivity disorder)    Diagnosed during teenage years  . Alcohol use disorder    On average 14-21 drinks per week  . Alcoholic cirrhosis of liver    Per patient - condition is not related to moderate alcohol use but rather prolonged NSAID use and recent abuse.  . Allergy   . Calculus of gallbladder without cholecystitis without obstruction   . Chronic pain    Neck/lower back  . Degenerative disc disease, lumbar   . Diabetic polyneuropathy   . Essential hypertension, benign   . Generalized anxiety disorder   . Glaucoma    Per patient report  . Headache   . 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 Major depressive disorder   . Other spondylosis with radiculopathy, cervical region   . Retinopathy    Right eye; per patient report  . Type 2 diabetes mellitus with hyperglycemia     Past Surgical History:  Procedure Laterality Date  . CHOLECYSTECTOMY N/A 06/02/2018   Procedure: LAPAROSCOPIC CHOLECYSTECTOMY;  Surgeon: Vickie Epley, MD;  Location: [facility] ORS;  Service: General;  Laterality: N/A;  .  KNEE ARTHROSCOPY     30 Years ago    There were no vitals filed for this visit.   Subjective Assessment - 08/22/20 1549    Subjective Patient reports middle and low back is "bad" and can be sitting and having sharp, "ice pick" pain along midback that can be intermittent that keeps him from taking a deep breath. There is not one particular movement that causes the sharp pain. Patient was seen by [facility] last week for his thoracic pain and received X-rays, but can't recall the imaging findings. He has previous MRI of the lumbar and cervical region and has plans to bring this imaging to his f/u appointment on 09/16/20, but due to this ongoing pain wants to try and schedule an earlier appointment. He reports prolonged sitting and yard work flared up his low back pain this past weekend. He denies any falls since last session    Limitations Standing;Walking;House hold activities;Sitting    How long can you sit comfortably? Right now 10-15 minutes because of pain in the mid back and low back.    How long can you stand comfortably? Probably about 20 minutes    How long can you walk comfortably? Probably about 10 minutes    Patient Stated Goals Reducing falls/not falling, being able to do daily activities    Currently in Pain? Yes  Pain Score 9     Pain Location Back    Pain Orientation Mid;Lower    Pain Descriptors / Indicators Sharp;Burning    Pain Type Chronic pain    Pain Radiating Towards posterior LLE    Pain Onset More than a month ago    Pain Frequency Constant    Aggravating Factors  walking, standing, sitting, breathing    Pain Relieving Factors beer and herbal medicine and pain medication    Effect of Pain on Daily Activities limited mobility    Multiple Pain Sites Yes    Pain Score 3    Pain Location Neck    Pain Orientation Posterior    Pain Descriptors / Indicators Other (Comment)   stiffness   Pain Type Chronic pain    Pain Onset More than a month ago    Pain Frequency  Constant    Aggravating Factors  comes and goes, waking up    Pain Relieving Factors herbal medicine              [facility] PT Assessment - 08/22/20 0001      AROM   Lumbar Flexion Mid tibia, slow movement    Lumbar Extension 25% pain low back    Lumbar - Right Side Bend lateral joint line    Lumbar - Left Side Bend 2 inches above lateral joint line pain L-side low back    Lumbar - Right Rotation <50%    Lumbar - Left Rotation <50% sciatic pain down LLE      Strength   Overall Strength Comments Lt hip flexion 4/5, R hip flexion 5/5, Lt knee flexion/extension 4+/5, R knee flexion/extension 5/5, Rt hip abduction 5/5, Lt hip abduction 4/5, Lt hip extension 3+/5, Rt hip extension 4+/5      Palpation   Palpation comment pain before restriction CPAs and R UPAs T6-T9 and CPA L5.      Ambulation/Gait   Gait Comments wide BOS, decreased stance time LLE, excessive frontal plane movement, limited hip flexion/extension throughout gait cycle on LLE                         [facility] Adult PT Treatment/Exercise - 08/22/20 0001      Self-Care   Other Self-Care Comments  see patient education                  PT Education - 08/22/20 1635    Education Details Education on re-assessment findings. Education on POC with recommendation to place PT on hold pending further evaluation from orthopedic specialist regarding continued thoracic pain. Review of current HEP.    Person(s) Educated Patient    Methods Explanation    Comprehension Verbalized understanding            PT Short Term Goals - 08/22/20 1646      PT SHORT TERM GOAL #1   Title Pt will be independent with initial HEP.    Baseline Pt provided initial HEP at evaluation 07/28/2020.    Time 3    Period Weeks    Status Achieved    Target Date 08/18/20      PT SHORT TERM GOAL #2   Title Patient will report no falls for at least 1 consecutive week.    Baseline Pt falls and trips frequently due to symptoms in back  and LLE.    Time 3    Period Weeks    Status Achieved    Target Date  08/18/20      PT SHORT TERM GOAL #3   Title Patient's LLE MMT will improve to at least 4/5 grossly.    Baseline 3+/5    Time 3    Period Weeks    Status Partially Met    Target Date 08/18/20      PT SHORT TERM GOAL #4   Title Pt will be able to ambulate 50 feet using SPC including at least 1 left turn without falling or LOB.    Baseline Pt has exacerbation of symptoms with turning/rotating left, which is when he typically falls.    Time 3    Period Weeks    Status On-going    Target Date 08/18/20             PT Long Term Goals - 08/22/20 1647      PT LONG TERM GOAL #1   Title Pt will be independent with advanced HEP.    Baseline Pt provided initial HEP at evaluation 07/28/2020.    Time 6    Period Weeks    Status Not Met      PT LONG TERM GOAL #2   Title Patient will report no falls for at least 3 consecutive weeks.    Baseline Pt falls and trips frequently due to symptoms in back and LLE.    Time 6    Period Weeks    Status Not Met      PT LONG TERM GOAL #3   Title Patient's LLE MMT will improve to at least 4+/5 grossly.    Baseline 3+/5    Time 6    Period Weeks    Status Partially Met      PT LONG TERM GOAL #4   Title Pt will be able to ambulate 150 feet using SPC including at least 2 left turns without falling or LOB.    Baseline Pt has exacerbation of symptoms with turning/rotating left, which is when he typically falls.    Time 6    Period Weeks    Status Deferred                 Plan - 08/22/20 1622    Clinical Impression Statement Patient has attended 7 sessions of PT since 07/28/20 for stenosis of the cervical and lumbar region though his chief complaint recently has been sharp thoracic pain that began about two weeks ago without known cause. The pain in his thoracic region appears to be worsening based upon his subjective reports of pain as well as notable distress with  positional changes and specific exercises during PT. While he has demonstrated moderate progress in the current treatment of his low back and neck pain demonstrating improved LE strength and trunk mobility, his thoracic pain is limiting further functional progression at this time and has become the main limiting factor in his daily activity currently. He will benefit from continued skilled PT to address lingering strength and mobility deficits as they pertain to the lumbar and cervical region for twice weekly for 6 weeks, though due to significant thoracic pain it is recommended that PT be placed on hold pending follow-up with orthopedic specialist for his new and worsening thoracic pain.    Personal Factors and Comorbidities Age;Time since onset of injury/illness/exacerbation;Past/Current Experience;Comorbidity 1;Comorbidity 3+;Comorbidity 2    Comorbidities see extensive PMH above    Examination-Activity Limitations Bathing;Bed Mobility;Bend;Carry;Dressing;Lift;Locomotion Level;Reach Overhead;Sit;Sleep;Squat;Stairs;Stand;Transfers    Examination-Participation Restrictions Cleaning;Community Activity;Driving;Meal Prep;Shop    Rehab Potential Fair  PT Frequency 2x / week   on hold pending orthopedic evaluation for T-spine pain   PT Duration 6 weeks   on hold pending orthopedic evaluation for T-spine pain   PT Treatment/Interventions ADLs/Self Care Home Management;Aquatic Therapy;Cryotherapy;Iontophoresis 4mg /ml Dexamethasone;Moist Heat;Neuromuscular re-education;Balance training;Therapeutic exercise;Therapeutic activities;Functional mobility training;Stair training;Gait training;Patient/family education;Energy conservation;Manual techniques;Dry needling;Passive range of motion;Taping;Electrical Stimulation    PT Next Visit Plan Progress hip strengthening. Introduce standing activity as tolerated. gait training    PT Home Exercise Plan Access Code [email or web address]    Recommended Other Services orthopedic  specialist for thoracic pain    Consulted and Agree with Plan of Care Patient           Patient will benefit from skilled therapeutic intervention in order to improve the following deficits and impairments:  Difficulty walking,Abnormal gait,Decreased activity tolerance,Decreased balance,Decreased mobility,Decreased strength,Impaired sensation,Pain,Increased muscle spasms,Decreased endurance,Decreased range of motion,Improper body mechanics,Postural dysfunction  Visit Diagnosis: Chronic left-sided low back pain with left-sided sciatica  Difficulty in walking, not elsewhere classified  Muscle weakness (generalized)  Cervicalgia     Problem List Patient Active Problem List   Diagnosis Date Noted  . Fall 08/09/2020  . Acute midline thoracic back pain 08/09/2020  . 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 No-show for appointment 08/04/2020  . Occult blood in stools 06/15/2020  . Mesenteric lymphadenopathy 06/14/2020  . Splenomegaly- mild  06/14/2020  . Screening for blood or protein in urine 06/14/2020  . Elevated blood pressure reading 06/14/2020  . History of cannabis abuse 06/14/2020  . Recurrent major depressive disorder, in partial remission ([place]) 06/09/2020  . Anxiety 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 06/09/2020  . Alcohol use disorder, moderate, dependence ([place]) 06/09/2020  . Spinal stenosis of cervical region 06/03/2020  . Frequent falls 05/23/2020  . Foraminal stenosis of cervical region 05/09/2020  . Diabetic polyneuropathy   . ADHD (attention deficit hyperactivity disorder)   . Generalized anxiety disorder   . Major depressive disorder   . Other spondylosis with radiculopathy, cervical region 09/22/2019  . Low back pain 07/28/2019  . Essential hypertension, benign 01/28/2019  . Alcoholic cirrhosis of liver 06/02/2018  . Type 2 diabetes mellitus with hyperglycemia 04/25/2018  . Alcohol use disorder, mild, abuse  04/25/2018  PHYSICAL THERAPY DISCHARGE SUMMARY  Visits from Start of Care: 7  Current functional level related to goals / functional outcomes: See above   Remaining deficits: See above   Education / Equipment: See above   Plan: Patient agrees to discharge.  Patient goals were partially met. Patient is being discharged due to not returning since the last visit.  ?????       Gwendolyn Grant, PT, DPT, ATC 08/22/20 4:55 PM  Gwendolyn Grant, PT, DPT, ATC 10/03/20 10:01 AM  [facility] [address] [place], [place], [address] Phone: [phone]   Fax:  [phone]  Name: Gerald Olson MRN: [id number] Date of Birth: 02/01/1966

## 2020-08-23 ENCOUNTER — Ambulatory Visit: Payer: Medicaid Other | Admitting: Adult Health

## 2020-08-24 ENCOUNTER — Ambulatory Visit: Payer: Medicaid Other

## 2020-08-29 ENCOUNTER — Ambulatory Visit: Payer: Medicaid Other

## 2020-08-30 ENCOUNTER — Encounter: Payer: Self-pay | Admitting: *Deleted

## 2020-08-30 ENCOUNTER — Ambulatory Visit: Payer: Medicaid Other | Admitting: Adult Health

## 2020-08-30 ENCOUNTER — Encounter: Payer: Self-pay | Admitting: Adult Health

## 2020-08-30 ENCOUNTER — Other Ambulatory Visit: Payer: Self-pay

## 2020-08-30 ENCOUNTER — Other Ambulatory Visit: Payer: Self-pay | Admitting: Adult Health

## 2020-08-30 VITALS — BP 152/88 | HR 92 | Ht 74.0 in | Wt 247.0 lb

## 2020-08-30 DIAGNOSIS — E1142 Type 2 diabetes mellitus with diabetic polyneuropathy: Secondary | ICD-10-CM | POA: Diagnosis not present

## 2020-08-30 DIAGNOSIS — M4802 Spinal stenosis, cervical region: Secondary | ICD-10-CM | POA: Diagnosis not present

## 2020-08-30 DIAGNOSIS — R4189 Other symptoms and signs involving cognitive functions and awareness: Secondary | ICD-10-CM

## 2020-08-30 DIAGNOSIS — M48061 Spinal stenosis, lumbar region without neurogenic claudication: Secondary | ICD-10-CM

## 2020-08-30 LAB — DRUG SCREEN 12+ALCOHOL+CRT, UR
Amphetamines, Urine: NEGATIVE ng/mL
BENZODIAZ UR QL: NEGATIVE ng/mL
Barbiturate: NEGATIVE ng/mL
Cannabinoids: NEGATIVE ng/mL
Cocaine (Metabolite): NEGATIVE ng/mL
Creatinine, Urine: 36.4 mg/dL (ref 20.0–300.0)
Ethanol, Urine: POSITIVE — AB
Meperidine: NEGATIVE ng/mL
Methadone: NEGATIVE ng/mL
OPIATE SCREEN URINE: NEGATIVE ng/mL
Oxycodone/Oxymorphone, Urine: NEGATIVE ng/mL
Phencyclidine: NEGATIVE ng/mL
Propoxyphene: NEGATIVE ng/mL
Tramadol: POSITIVE — AB

## 2020-08-30 MED ORDER — NONFORMULARY OR COMPOUNDED ITEM: 2.0000 g | Freq: Four times a day (QID) | 5 refills | Status: DC | PRN

## 2020-08-30 MED ORDER — TOPIRAMATE 50 MG PO TABS
ORAL_TABLET | ORAL | 3 refills | Status: DC
Start: 2020-08-30 — End: 2020-08-30

## 2020-08-30 MED FILL — TOPIRAMATE 50 MG TABLET: 50 | 90 days supply | Qty: 270 | Fill #0

## 2020-08-30 NOTE — Progress Notes (Signed)
I agree with the above plan 

## 2020-08-30 NOTE — Progress Notes (Signed)
[facility] [address] [place]. [place] [address] [phone]       OFFICE FOLLOW UP VISIT NOTE  [name] Date of Birth:  Aug 16, 1966 Medical Record Number:  [id number]   Referring MD: Cammie Fulp Reason for Referral: Neuropathy   Chief Complaint  Patient presents with  . Follow-up    He stopped duloxetine due to dizziness and did not think it was helpful. He does not feel the increase in Topamax has been very beneficial by itself. He was in PT for low back pain but it has been placed on hold because of his new thoracic pain. He has a pending appt with orthopaedics on 09/02/20. He is still under the care of Dr. Melvyn Novas.      HPI:  Today, 08/30/2020, [name] returns for follow-up after being seen 4 months prior for neuropathy and cognitive complaints.  Remains on topiramate 50 mg twice daily for diabetic neuropathy initially providing benefit and even improving especially in combination with alpha-lipoic acid but unfortunately suffered a fall approximately 1 month ago not experiencing thoracic pain and worsening left leg radiculopathy. He has follow-up visit with orthopedics on Friday for further evaluation. Cognition stable without worsening.  Initiated duloxetine at prior visit in hopes of benefit of depression as well as paresthesias but he self discontinued as he did not feel as though this was beneficial and reported dizziness.  He routinely is followed by psychiatry for depression and anxiety but currently declines interest in antidepression medications. He continues to follow with orthopedics Dr. Rodell Perna for spinal and lumbar stenosis previously participating in outpatient PT but currently on hold due to recent fall. 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 No further concerns at this time.     History provided for reference purposes only Update 04/27/2020 JM: [name] returns  today for neuropathy follow-up Since prior visit, he has noticed some improvement in regards to right leg > left leg neuropathy but does report continued numbness sensation.  He believes Topamax 25 mg nightly has been beneficial and he has tolerated well.  He did trial taking 2 tablets for a couple of nights with further improvement and denies side effects.   He is also been having increased cervical pain radiating down left arm.  He has not contacted Dr. [name] in regards to worsening symptoms over the past 2 months.  He also reports right shoulder burning type pain that can wake him up while sleeping.  He reports he has not slept in the past 3 nights due to neck and shoulder pain.  He questions possibility of fibromyalgia due to multiple pain locations. Awaiting lumbar and cervical procedure once diabetes becomes well controlled but he continues to have difficulty.  Continues to follow with endocrinology. He also complains of cognitive concerns including short-term and long-term memory, delayed recall and occasional word finding difficulty.  He denies any prior stroke history or TBI.  He did have neurocognitive evaluation by Dr. Melvyn Novas on 03/31/2020 did show just chronic weakness across processing speed and variability noted approximately complex attention as well as encoding and retrieval aspects of verbal and visual memory.  Etiology potentially pronounced psychiatric distress, ongoing sleep dysfunction, chronic pain, history of ADHD, vascular factors such as uncontrolled diabetes and potentially vascular changes would affect similar areas of cognitive functioning which could contribute to ongoing deficits.  No concerns for early onset neurodegenerative illness such as Alzheimer's disease  at the time of testing. Recommendations of obtaining MRI for further evaluation as well as medication and psychotherapy for treatment of anxiety and depression as well as pain management and different lifestyle options (heavy  alcohol intake, heavy caffeine intake, and THC use).  Update 01/28/2020 Dr. Leonie Man : He returns for follow-up after last visit 6 months ago.  He states that he did increase the gabapentin but was taking it only twice daily.  It helps with the pins-and-needles sensation but is still in constant pain as well as feels numb.  In the last few weeks he feels symptoms may gotten worse.  Is also gained about 20 pounds this year.  He also complains of involuntary myoclonic jerk-like movements in his left hand.  He had EMG nerve conduction study done on 10/22/2019 by Dr. 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  Lab works for reversible causes of neuropathy on 10/08/2019 pretty much normal except for low vitamin D levels of 114.  Patient states he has known low vitamin D level since last 20 years and was on vitamin D replacement however few weeks prior to seeing me and last visit here ran out of vitamin D and had not restarted taking it due to financial reasons.  He has chronic back pain with left leg radiculopathy and he plans on having back surgery but needs to lose some weight prior to that.  He has been taking while like to switch seems to help his chronic pain.  He has not tried Topamax and is willing to try that.  Initial visit 10/08/2019 Dr. Leonie Man: [name] is a 55 year old Caucasian male with past medical history of diabetes, hypertension, obesity, degenerative arthritis and chronic back and neck pain who has been having lower extremity paresthesias for the last 6 months which seem to be progressive.  He complains of constant burning, tingling and pins and needle sensation.  At times this becomes bearable.  This is present throughout the day.  He recently started taking gabapentin 100 mg twice daily which is not being effective.  He is also noticed that his balance is poor and has had  frequent falls.  In fact he had a fall yesterday and today seems to be in acute pain and his gait is limited.  He has a longstanding history of degenerative cervical and lumbar spine disease.  He has been seen by orthopedic surgeon wants to operate on his neck but his diabetes is out of control and is working with endocrinologist to bring it under control so he can have the surgery.  Review of electronic medical records show that he had a cervical spine x-ray on 09/22/2019 which showed degenerative changes from C3-C7.  X-rays of lumbar spine on 07/21/2018 show significant facet degenerative changes at L5-S1 with significant foraminal narrowing on the right at the L4-5 and on the left at L5-S1.  He takes tramadol and muscle relaxant Robaxin which helps partially only.  He has not had any EMG nerve conduction studies done or lab work for other reversible causes of neuropathy done.  He states his feet are now constantly numb with lack of feeling which has been contributing to his falling.  His sugars have been persistently high and last A1c on 10/01/2019 was yet high at 7.6 but improved from 9.1 from 3 months ago.      ROS:   14 system review of systems  is positive for see HPI and all other systems negative  PMH:  Past Medical History:  Diagnosis Date  . ADHD (attention deficit hyperactivity disorder)    Diagnosed during teenage years  . Alcohol use disorder    On average 14-21 drinks per week  . Alcoholic cirrhosis of liver    Per patient - condition is not related to moderate alcohol use but rather prolonged NSAID use and recent abuse.  . Allergy   . Calculus of gallbladder without cholecystitis without obstruction   . Chronic pain    Neck/lower back  . Degenerative disc disease, lumbar   . Diabetic polyneuropathy   . Essential hypertension, benign   . Generalized anxiety disorder   . Glaucoma    Per patient report  . Headache   . 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 Major depressive disorder   . Other spondylosis with radiculopathy, cervical region   . Retinopathy    Right eye; per patient report  . Type 2 diabetes mellitus with hyperglycemia     Social History:  Social History   Socioeconomic History  . Marital status: Single    Spouse name: Not on file  . Number of children: Not on file  . Years of education: 53  . Highest education level: Master's degree (e.g., MA, MS, MEng, MEd, MSW, MBA)  Occupational History  . Not on file  Tobacco Use  . Smoking status: Former Smoker    Quit date: 06/25/2003    Years since quitting: 17.1  . Smokeless tobacco: Never Used  . Tobacco comment: Currently vapes  Vaping Use  . Vaping Use: Every day  Substance and Sexual Activity  . Alcohol use: Yes    Alcohol/week: 14.0 - 21.0 standard drinks    Types: 14 - 21 Cans of beer per week    Comment: 2-3 beers per night on average; sometimes up to 6  . Drug use: Yes    Types: Marijuana    Comment: 1/week to help sleep; edibles, does not smoke  . Sexual activity: Yes  Other Topics Concern  . Not on file  Social History Narrative  . Not on file   Social Determinants of Health   Financial Resource Strain: Not on file  Food Insecurity: Not on file  Transportation Needs: Not on file  Physical Activity: Not on file  Stress: Not on file  Social Connections: Not on file  Intimate Partner Violence: Not on file    Medications:   Current Outpatient Medications on File Prior to Visit  Medication Sig Dispense Refill  . Accu-Chek FastClix Lancets MISC Use as instructed to check blood sugar up to 3 times daily. (Patient taking differently: 1 each by Other route 2 (two) times daily.) 102 each 11  . ALPHA-LIPOIC ACID PO Take by mouth.    . Blood Glucose Monitoring Suppl (ACCU-CHEK GUIDE ME) w/Device KIT 1 kit by Does not apply route 3 (three) times daily. (Patient taking differently: 1 kit by Does not apply route 2 (two) times  daily.) 1 kit 0  . calcium-vitamin D (OSCAL WITH D) 500-200 MG-UNIT tablet Take 1 tablet by mouth.    . dapagliflozin propanediol (FARXIGA) 5 MG TABS tablet Take 1 tablet (5 mg total) by mouth daily before breakfast. 90 tablet 3  . glucose blood (ACCU-CHEK GUIDE) test strip Use as instructed to check blood sugar up to 3 times daily. (Patient taking differently: 1 each by  Other route 2 (two) times daily.) 100 each 12  . imatinib (GLEEVEC) 400 MG tablet TAKE 1 TABLET (400 MG TOTAL) BY MOUTH DAILY. TAKE WITH MEALS AND LARGE GLASS OF WATER. 30 tablet 0  . insulin glargine (LANTUS SOLOSTAR) 100 UNIT/ML Solostar Pen Inject 22 Units into the skin every morning. And pen needles 1/day 15 mL 2  . lisinopril (ZESTRIL) 20 MG tablet Take 1 tablet (20 mg total) by mouth daily. 90 tablet 0  . metFORMIN (GLUCOPHAGE-XR) 500 MG 24 hr tablet Take 2 tablets (1,000 mg total) by mouth 2 (two) times daily with a meal. 120 tablet 2  . Multiple Vitamins-Minerals (MULTIVITAMIN WITH MINERALS) tablet Take 1 tablet by mouth daily.     . Nutritional Supplements (DHEA PO) Take 100 mg by mouth daily.    Marland Kitchen OVER THE COUNTER MEDICATION Black seed oil 2000per day    . TRUEPLUS PEN NEEDLES 32G X 4 MM MISC 1 each by Other route daily.     Marland Kitchen zinc gluconate 50 MG tablet Take 50 mg by mouth daily.     No current facility-administered medications on file prior to visit.    Allergies:   Allergies  Allergen Reactions  . Hydrocodone Itching    Itching, face tingling, dizziness.  [name] [Oxycodone Hcl] Itching     There were no vitals filed for this visit. There is no height or weight on file to calculate BMI.   Physical Exam General: Pleasant middle-aged Caucasian male seated, in no evident distress Head: head normocephalic and atraumatic.   Neck: supple with no carotid or supraclavicular bruits Cardiovascular: regular rate and rhythm, no murmurs Skin:  no rash/petichiae Vascular:  Normal pulses all  extremities  Neurologic Exam Mental Status: Awake and fully alert. Oriented to place and time. Recent and remote memory intact during visit. Attention span, concentration and fund of knowledge appropriate during visit. Mood and affect appropriate.   Cranial Nerves: Pupils equal, briskly reactive to light. Extraocular movements full without nystagmus. Visual fields full to confrontation. Hearing intact. Facial sensation intact. Face, tongue, palate moves normally and symmetrically.  Motor: Normal bulk and tone. Normal strength in all tested extremity muscles.   Sensory.:  Diminished   touch , pinprick sensation in both feet stocking distribution.  Impaired vibration in both feet from ankle down.  Position sense mildly impaired.  Romberg sign negative.  Fine action tremor of both outstretched upper extremities left greater than right.  No pill-rolling or cogwheel rigidity. Coordination: Rapid alternating movements normal in all extremities. Finger-to-nose and heel-to-shin performed accurately bilaterally. Gait and Station: Arises from chair with  difficulty. Stance is stooped and favors his back.  And left leg.  Broad-based antalgic gait uses a cane. Reflexes: 1+ and symmetric in upper extremities and absent in both lower extremities. Toes downgoing.       ASSESSMENT/PLAN: 55 year old Caucasian male with lower extremity paresthesias and pain due to worsening diabetic peripheral neuropathic pain.  He also has chronic back pain and left leg sciatica as well as increasing falls and gait difficulties due to degenerative lumbar and cervical spine disease. Prior complaints of cognitive deficits which have been stable. New complaints thoracic pain from recent fall    Peripheral neuropathy, diabetic -EMG/NCV 10/22/2019 diabetic peripheral polyneuropathy -Increase topiramate 50 mg a.m. and 100 mg p.m. (prior 50 mg twice daily) -Trial compounding transdermal cream for possible management  Lumbar  radiculopathy Cervical pain -Followed by Dr. [name] orthopedic surgery -scheduled follow-up 1/7 for further evaluation of thoracic  pain post fall  Cognitive deficits -Overall stable since prior without worsening -per neurocognitive evaluation concern for cognitive decline compared to prior testing although reported main contributor likely psychiatric distress but also potentially vascular in nature -MRI brain w/wo contrast 05/29/2020 scattered T2/FLAIR punctate hyperintense foci in the subcortical deep white matter likely representing mild chronic microvascular ischemic change in brain volume mildly less than expected for age otherwise unremarkable -Continues to be followed by psychiatry    Follow-up in 6 months or call earlier if needed   CC:  [facility] provider: Dr. Sheral Flow, Kelby Aline, FNP    I spent 30 minutes of face-to-face and non-face-to-face time with patient.  This included previsit chart review, lab review, study review, order entry, electronic health record documentation, patient education/discussion regarding ongoing neuropathy with paresthesias and further treatment recommendations, cognitive complaints, lumbar and cervical pain with new thoracic pain and further discussion and answered all other questions to patient satisfaction   Frann Rider, [facility]  [facility] [address] [place], [place] [address]  Phone [phone] Fax [phone] Note: This document was prepared with digital dictation and possible smart phrase technology. Any transcriptional errors that result from this process are unintentional.

## 2020-08-30 NOTE — Progress Notes (Signed)
Ultram positive as prescribed, alcohol also positive, advise discontinue alcohol.

## 2020-08-30 NOTE — Patient Instructions (Addendum)
Your Plan:  Increase topamax 50mg  AM and 100mg  PM for neuropathy   Continue to follow with orthopedics for neck and lower back pain as well as new middle back pain    Follow up in 6 months or call earlier if needed      Thank you for coming to see at [facility]. I hope we have been able to provide you high quality care today.  You may receive a patient satisfaction survey over the next few weeks. We would appreciate your feedback and comments so that we may continue to improve ourselves and the health of our patients.

## 2020-08-31 ENCOUNTER — Ambulatory Visit: Payer: Medicaid Other

## 2020-09-01 ENCOUNTER — Other Ambulatory Visit: Payer: Self-pay | Admitting: Urology

## 2020-09-02 ENCOUNTER — Other Ambulatory Visit: Payer: Self-pay | Admitting: Urology

## 2020-09-02 ENCOUNTER — Ambulatory Visit: Payer: Medicaid Other | Admitting: Endocrinology

## 2020-09-02 DIAGNOSIS — M546 Pain in thoracic spine: Secondary | ICD-10-CM | POA: Diagnosis not present

## 2020-09-03 NOTE — Progress Notes (Signed)
Gerald Olson  [phone] [phone]) [phone]  ID: [name] OB: 03-04-1966  MR#: [id number]  IWL#:[id number]  Patient Care Team: Doreen Beam, FNP as PCP - General (Family Medicine)   CHIEF COMPLAINT: CML.  INTERVAL HISTORY: Patient returns to clinic today for repeat laboratory work, further evaluation, and to assess his toleration of [place].  He continues to have chronic back and neck pain, but otherwise feels well.  He is tolerating Gleevec without significant side effects.  He has no neurologic complaints. He denies any recent fevers or illnesses. He has a good appetite and denies weight loss. He has no chest pain, shortness of breath, cough, or hemoptysis. He denies any nausea, vomiting, constipation, or diarrhea. He has no melena or hematochezia. He has no urinary complaints.  Patient offers no further specific complaints today.  REVIEW OF SYSTEMS:   Review of Systems  Constitutional: Negative.  Negative for fever, malaise/fatigue and weight loss.  Respiratory: Negative.  Negative for cough, hemoptysis and shortness of breath.   Cardiovascular: Negative.  Negative for chest pain and leg swelling.  Gastrointestinal: Negative.  Negative for abdominal pain and blood in stool.  Genitourinary: Negative.  Negative for dysuria, flank pain and hematuria.  Musculoskeletal: Positive for back pain.  Skin: Negative.  Negative for rash.  Neurological: Negative.  Negative for dizziness, focal weakness, weakness and headaches.  Psychiatric/Behavioral: Negative.  The patient is not nervous/anxious.     As per HPI. Otherwise, a complete review of systems is negative.  PAST MEDICAL HISTORY: Past Medical History:  Diagnosis Date  . ADHD (attention deficit hyperactivity disorder)    Diagnosed during teenage years  . Alcohol use disorder    On average 14-21 drinks per week  . Alcoholic cirrhosis of liver    Per patient - condition is not related  to moderate alcohol use but rather prolonged NSAID use and recent abuse.  . Allergy   . Calculus of gallbladder without cholecystitis without obstruction   . Chronic pain    Neck/lower back  . Degenerative disc disease, lumbar   . Diabetic polyneuropathy   . Essential hypertension, benign   . Generalized anxiety disorder   . Glaucoma    Per patient report  . Headache   . 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 Major depressive disorder   . Other spondylosis with radiculopathy, cervical region   . Retinopathy    Right eye; per patient report  . Type 2 diabetes mellitus with hyperglycemia     PAST SURGICAL HISTORY: Past Surgical History:  Procedure Laterality Date  . CHOLECYSTECTOMY N/A 06/02/2018   Procedure: LAPAROSCOPIC CHOLECYSTECTOMY;  Surgeon: Vickie Epley, MD;  Location: [facility] ORS;  Service: General;  Laterality: N/A;  . KNEE ARTHROSCOPY     30 Years ago    FAMILY HISTORY: Family History  Problem Relation Age of Onset  . Aneurysm Father   . Multiple sclerosis Sister   . Emphysema Maternal Grandfather   . Diabetes Neg Hx     ADVANCED DIRECTIVES (Y/N):  N  HEALTH MAINTENANCE: Social History   Tobacco Use  . Smoking status: Former Smoker    Quit date: 06/25/2003    Years since quitting: 17.2  . Smokeless tobacco: Never Used  . Tobacco comment: Currently vapes  Vaping Use  . Vaping Use: Every day  Substance Use Topics  . Alcohol use: Yes    Alcohol/week: 14.0 - 21.0 standard  drinks    Types: 14 - 21 Cans of beer per week    Comment: 2-3 beers per night on average; sometimes up to 6  . Drug use: Yes    Types: Marijuana    Comment: 1/week to help sleep; edibles, does not smoke     Colonoscopy:  PAP:  Bone density:  Lipid panel:  Allergies  Allergen Reactions  . Hydrocodone Itching    Itching, face tingling, dizziness.  [name] [Oxycodone Hcl] Itching    Current Outpatient Medications   Medication Sig Dispense Refill  . Accu-Chek FastClix Lancets MISC Use as instructed to check blood sugar up to 3 times daily. (Patient taking differently: 1 each by Other route 2 (two) times daily.) 102 each 11  . ALPHA-LIPOIC ACID PO Take by mouth.    . Blood Glucose Monitoring Suppl (ACCU-CHEK GUIDE ME) w/Device KIT 1 kit by Does not apply route 3 (three) times daily. (Patient taking differently: 1 kit by Does not apply route 2 (two) times daily.) 1 kit 0  . calcium-vitamin D (OSCAL WITH D) 500-200 MG-UNIT tablet Take 1 tablet by mouth.    Marland Kitchen glucose blood (ACCU-CHEK GUIDE) test strip Use as instructed to check blood sugar up to 3 times daily. (Patient taking differently: 1 each by Other route 2 (two) times daily.) 100 each 12  . imatinib (GLEEVEC) 400 MG tablet TAKE 1 TABLET (400 MG TOTAL) BY MOUTH DAILY. TAKE WITH MEALS AND LARGE GLASS OF WATER. 30 tablet 0  . insulin glargine (LANTUS SOLOSTAR) 100 UNIT/ML Solostar Pen Inject 20 Units into the skin every morning. And pen needles 1/day (Patient taking differently: Inject 22 Units into the skin every morning. And pen needles 1/day) 15 mL 2  . lisinopril (ZESTRIL) 20 MG tablet Take 1 tablet (20 mg total) by mouth daily. 90 tablet 0  . metFORMIN (GLUCOPHAGE-XR) 500 MG 24 hr tablet Take 2 tablets (1,000 mg total) by mouth 2 (two) times daily with a meal. 120 tablet 2  . Multiple Vitamins-Minerals (MULTIVITAMIN WITH MINERALS) tablet Take 1 tablet by mouth daily.     . NONFORMULARY OR COMPOUNDED ITEM Apply 2 g topically 4 (four) times daily as needed (Diabetic neuropathy pain). 1 each 5  . Nutritional Supplements (DHEA PO) Take 100 mg by mouth daily.    Marland Kitchen OVER THE COUNTER MEDICATION Black seed oil 2000per day    . topiramate (TOPAMAX) 50 MG tablet Take 1 tablet (50 mg total) by mouth in the morning AND 2 tablets (100 mg total) at bedtime. Please call your specialist to schedule follow up due.. 270 tablet 3  . TRUEPLUS PEN NEEDLES 32G X 4 MM MISC 1 each  by Other route daily.     Marland Kitchen UNABLE TO FIND Med Name: compound cream rx faxed to [facility] (amantadine 8%, baclofen 2%, gabapentin 6%, amitriptyline 4%, bupivacaine 2%, clonidine 0.2%, nifedipine 2%)    . zinc gluconate 50 MG tablet Take 50 mg by mouth daily.     No current facility-administered medications for this visit.    OBJECTIVE: Vitals:   09/08/20 1424  BP: 133/83  Pulse: 97  Resp: 18  Temp: 98.8 F (37.1 C)     Body mass index is 28.73 kg/m.    ECOG FS:0 - Asymptomatic  General: Well-developed, well-nourished, no acute distress. Eyes: Pink conjunctiva, anicteric sclera. HEENT: Normocephalic, moist mucous membranes. Lungs: No audible wheezing or coughing. Heart: Regular rate and rhythm. Abdomen: Soft, nontender, no obvious distention. Musculoskeletal: No edema, cyanosis,  or clubbing. Neuro: Alert, answering all questions appropriately. Cranial nerves grossly intact. Skin: No rashes or petechiae noted. Psych: Normal affect.  LAB RESULTS:  Lab Results  Component Value Date   NA 133 (L) 09/08/2020   K 4.2 09/08/2020   CL 104 09/08/2020   CO2 22 09/08/2020   GLUCOSE 201 (H) 09/08/2020   BUN 14 09/08/2020   CREATININE 0.96 09/08/2020   CALCIUM 7.9 (L) 09/08/2020   PROT 6.0 (L) 09/08/2020   ALBUMIN 3.3 (L) 09/08/2020   AST 39 09/08/2020   ALT 33 09/08/2020   ALKPHOS 77 09/08/2020   BILITOT 0.2 (L) 09/08/2020   GFRNONAA >60 09/08/2020   GFRAA 100 06/14/2020    Lab Results  Component Value Date   WBC 6.2 09/08/2020   NEUTROABS 3.8 09/08/2020   HGB 12.5 (L) 09/08/2020   HCT 37.8 (L) 09/08/2020   MCV 88.1 09/08/2020   PLT 240 09/08/2020     STUDIES: No results found.  ASSESSMENT: CML PLAN:    1.  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  Repeat CT scan results from July 18, 2020 reviewed independently and reported as above with only mild abdominal  lymphadenopathy and mild splenomegaly that are chronic and unchanged.  Bone marrow biopsy was negative for disease.  Continue 400 mg Gleevec daily.  Return to clinic in 6 weeks for laboratory work only and then in 3 months for laboratory work and further evaluation.  Appreciate clinical pharmacy input.   2. Cognitive complaints: Patient does not complain of this today.   3. Iron deficiency: Previously recommended dietary changes. 4. Splenomegaly/mesenteric lymphadenopathy: Chronic and unchanged.  Unclear if this is related to CML.  Monitor. 5.  Back/neck pain: Patient reports he is changing orthopedics to the local surgeon.  Follow-up and treatment per orthopedic physician.   I spent a total of 30 minutes reviewing chart data, face-to-face evaluation with the patient, counseling and coordination of care as detailed above.   Patient expressed understanding and was in agreement with this plan. He also understands that He can call clinic at any time with any questions, concerns, or complaints.    Lloyd Huger, MD   09/09/2020 10:55 AM

## 2020-09-05 ENCOUNTER — Other Ambulatory Visit: Payer: Self-pay

## 2020-09-05 ENCOUNTER — Encounter: Payer: Self-pay | Admitting: Endocrinology

## 2020-09-05 ENCOUNTER — Ambulatory Visit (INDEPENDENT_AMBULATORY_CARE_PROVIDER_SITE_OTHER): Payer: Medicaid Other | Admitting: Endocrinology

## 2020-09-05 VITALS — BP 132/88 | HR 92 | Ht 76.0 in | Wt 240.0 lb

## 2020-09-05 DIAGNOSIS — E1165 Type 2 diabetes mellitus with hyperglycemia: Secondary | ICD-10-CM

## 2020-09-05 LAB — POCT GLYCOSYLATED HEMOGLOBIN (HGB A1C): Hemoglobin A1C: 7 % — AB (ref 4.0–5.6)

## 2020-09-05 MED ORDER — LANTUS SOLOSTAR 100 UNIT/ML ~~LOC~~ SOPN
20.0000 [IU] | PEN_INJECTOR | SUBCUTANEOUS | 2 refills | Status: DC
Start: 2020-09-05 — End: 2020-10-18

## 2020-09-05 MED FILL — metFORMIN HCL ER 500 MG TB2: 500 | 30 days supply | Qty: 120 | Fill #1

## 2020-09-05 NOTE — Patient Instructions (Addendum)
check your blood sugar twice a day.  vary the time of day when you check, between before the 3 meals, and at bedtime.  also check if you have symptoms of your blood sugar being too high or too low.  please keep a record of the readings and bring it to your next appointment here (or you can bring the meter itself).  You can write it on any piece of paper.  please call us sooner if your blood sugar goes below 70, or if you have a lot of readings over 200. Please continue the same metformin and reduce the Lantus to 20 units each morning. Please come back for a follow-up appointment in 3 months.

## 2020-09-05 NOTE — Progress Notes (Signed)
Subjective:    Patient ID: Gerald Olson, male    DOB: 03/12/1966, 55 y.o.   MRN: [id number]  HPI Pt returns for f/u of diabetes mellitus:  DM type: Insulin-requiring type 2 Dx'ed: 2006 (he lost 70 lbs, so normoglycemia returned until 2019).   Complications: PN, PAD, and DR Therapy: insulin since 2020, and metformin.   DKA: never Severe hypoglycemia: never Pancreatitis: once (2021), on Victoza.   Pancreatic imaging: normal on 2021 CT SDOH: medicaid pays for lantus.  Other: He needs to be cleared for C-spine surgery; He declines multiple daily injections.     Interval history: no cbg record, but states cbg's vary from 85-190.  There is no trend throughout the day.  He does not take [place].  Surgery has been delayed.   Past Medical History:  Diagnosis Date  . ADHD (attention deficit hyperactivity disorder)    Diagnosed during teenage years  . Alcohol use disorder    On average 14-21 drinks per week  . Alcoholic cirrhosis of liver    Per patient - condition is not related to moderate alcohol use but rather prolonged NSAID use and recent abuse.  . Allergy   . Calculus of gallbladder without cholecystitis without obstruction   . Chronic pain    Neck/lower back  . Degenerative disc disease, lumbar   . Diabetic polyneuropathy   . Essential hypertension, benign   . Generalized anxiety disorder   . Glaucoma    Per patient report  . Headache   . 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 Major depressive disorder   . Other spondylosis with radiculopathy, cervical region   . Retinopathy    Right eye; per patient report  . Type 2 diabetes mellitus with hyperglycemia     Past Surgical History:  Procedure Laterality Date  . CHOLECYSTECTOMY N/A 06/02/2018   Procedure: LAPAROSCOPIC CHOLECYSTECTOMY;  Surgeon: Ancil Linsey, MD;  Location: [facility] ORS;  Service: General;  Laterality: N/A;  . KNEE ARTHROSCOPY     30 Years ago     Social History   Socioeconomic History  . Marital status: Single    Spouse name: Not on file  . Number of children: Not on file  . Years of education: 52  . Highest education level: Master's degree (e.g., MA, MS, MEng, MEd, MSW, MBA)  Occupational History  . Not on file  Tobacco Use  . Smoking status: Former Smoker    Quit date: 06/25/2003    Years since quitting: 17.2  . Smokeless tobacco: Never Used  . Tobacco comment: Currently vapes  Vaping Use  . Vaping Use: Every day  Substance and Sexual Activity  . Alcohol use: Yes    Alcohol/week: 14.0 - 21.0 standard drinks    Types: 14 - 21 Cans of beer per week    Comment: 2-3 beers per night on average; sometimes up to 6  . Drug use: Yes    Types: Marijuana    Comment: 1/week to help sleep; edibles, does not smoke  . Sexual activity: Yes  Other Topics Concern  . Not on file  Social History Narrative  . Not on file   Social Determinants of Health   Financial Resource Strain: Not on file  Food Insecurity: Not on file  Transportation Needs: Not on file  Physical Activity: Not on file  Stress: Not on file  Social Connections: Not on file  Intimate Partner Violence: Not on file  Current Outpatient Medications on File Prior to Visit  Medication Sig Dispense Refill  . Accu-Chek FastClix Lancets MISC Use as instructed to check blood sugar up to 3 times daily. (Patient taking differently: 1 each by Other route 2 (two) times daily.) 102 each 11  . ALPHA-LIPOIC ACID PO Take by mouth.    . Blood Glucose Monitoring Suppl (ACCU-CHEK GUIDE ME) w/Device KIT 1 kit by Does not apply route 3 (three) times daily. (Patient taking differently: 1 kit by Does not apply route 2 (two) times daily.) 1 kit 0  . calcium-vitamin D (OSCAL WITH D) 500-200 MG-UNIT tablet Take 1 tablet by mouth.    Marland Kitchen glucose blood (ACCU-CHEK GUIDE) test strip Use as instructed to check blood sugar up to 3 times daily. (Patient taking differently: 1 each by Other  route 2 (two) times daily.) 100 each 12  . imatinib (GLEEVEC) 400 MG tablet TAKE 1 TABLET (400 MG TOTAL) BY MOUTH DAILY. TAKE WITH MEALS AND LARGE GLASS OF WATER. 30 tablet 0  . lisinopril (ZESTRIL) 20 MG tablet Take 1 tablet (20 mg total) by mouth daily. 90 tablet 0  . metFORMIN (GLUCOPHAGE-XR) 500 MG 24 hr tablet Take 2 tablets (1,000 mg total) by mouth 2 (two) times daily with a meal. 120 tablet 2  . Multiple Vitamins-Minerals (MULTIVITAMIN WITH MINERALS) tablet Take 1 tablet by mouth daily.     . NONFORMULARY OR COMPOUNDED ITEM Apply 2 g topically 4 (four) times daily as needed (Diabetic neuropathy pain). 1 each 5  . Nutritional Supplements (DHEA PO) Take 100 mg by mouth daily.    Marland Kitchen OVER THE COUNTER MEDICATION Black seed oil 2000per day    . topiramate (TOPAMAX) 50 MG tablet Take 1 tablet (50 mg total) by mouth in the morning AND 2 tablets (100 mg total) at bedtime. Please call your specialist to schedule follow up due.. 270 tablet 3  . TRUEPLUS PEN NEEDLES 32G X 4 MM MISC 1 each by Other route daily.     Marland Kitchen UNABLE TO FIND Med Name: compound cream rx faxed to [facility] (amantadine 8%, baclofen 2%, gabapentin 6%, amitriptyline 4%, bupivacaine 2%, clonidine 0.2%, nifedipine 2%)    . zinc gluconate 50 MG tablet Take 50 mg by mouth daily.     No current facility-administered medications on file prior to visit.    Allergies  Allergen Reactions  . Hydrocodone Itching    Itching, face tingling, dizziness.  Kyra Searles [Oxycodone Hcl] Itching    Family History  Problem Relation Age of Onset  . Aneurysm Father   . Multiple sclerosis Sister   . Emphysema Maternal Grandfather   . Diabetes Neg Hx     BP 132/88   Pulse 92   Ht 6\' 4"  (1.93 m)   Wt 240 lb (108.9 kg)   SpO2 98%   BMI 29.21 kg/m    Review of Systems He denies hypoglycemia.      Objective:   Physical Exam VITAL SIGNS:  See vs page GENERAL: no distress Pulses: dorsalis pedis intact bilat.   MSK: no  deformity of the feet CV: no leg edema Skin:  no ulcer on the feet.  normal color and temp on the feet. Neuro: sensation is intact to touch on the feet, but severely decreased from normal.   Lab Results  Component Value Date   HGBA1C 7.0 (A) 09/05/2020   Lab Results  Component Value Date   CREATININE 1.10 07/18/2020   BUN 13 06/14/2020   NA  134 06/14/2020   K 4.1 06/14/2020   CL 98 06/14/2020   CO2 21 06/14/2020      Assessment & Plan:  Insulin-requiring type 2 DM, with PAD: overcontrolled.    Patient Instructions  check your blood sugar twice a day.  vary the time of day when you check, between before the 3 meals, and at bedtime.  also check if you have symptoms of your blood sugar being too high or too low.  please keep a record of the readings and bring it to your next appointment here (or you can bring the meter itself).  You can write it on any piece of paper.  please call us sooner if your blood sugar goes below 70, or if you have a lot of readings over 200. Please continue the same metformin and reduce the Lantus to 20 units each morning. Please come back for a follow-up appointment in 3 months.

## 2020-09-07 ENCOUNTER — Other Ambulatory Visit: Payer: Self-pay | Admitting: *Deleted

## 2020-09-07 DIAGNOSIS — C9210 Chronic myeloid leukemia, BCR/ABL-positive, not having achieved remission: Secondary | ICD-10-CM

## 2020-09-08 ENCOUNTER — Encounter: Payer: Self-pay | Admitting: Oncology

## 2020-09-08 ENCOUNTER — Inpatient Hospital Stay (HOSPITAL_BASED_OUTPATIENT_CLINIC_OR_DEPARTMENT_OTHER): Payer: Medicaid Other | Admitting: Oncology

## 2020-09-08 ENCOUNTER — Inpatient Hospital Stay: Payer: Medicaid Other | Attending: Oncology

## 2020-09-08 ENCOUNTER — Inpatient Hospital Stay: Payer: Medicaid Other | Admitting: Pharmacist

## 2020-09-08 ENCOUNTER — Other Ambulatory Visit: Payer: Self-pay

## 2020-09-08 VITALS — BP 133/83 | HR 97 | Temp 98.8°F | Resp 18 | Wt 236.0 lb

## 2020-09-08 DIAGNOSIS — H409 Unspecified glaucoma: Secondary | ICD-10-CM | POA: Insufficient documentation

## 2020-09-08 DIAGNOSIS — M542 Cervicalgia: Secondary | ICD-10-CM | POA: Diagnosis not present

## 2020-09-08 DIAGNOSIS — M549 Dorsalgia, unspecified: Secondary | ICD-10-CM | POA: Insufficient documentation

## 2020-09-08 DIAGNOSIS — I10 Essential (primary) hypertension: Secondary | ICD-10-CM | POA: Insufficient documentation

## 2020-09-08 DIAGNOSIS — K7030 Alcoholic cirrhosis of liver without ascites: Secondary | ICD-10-CM | POA: Diagnosis not present

## 2020-09-08 DIAGNOSIS — Z79899 Other long term (current) drug therapy: Secondary | ICD-10-CM | POA: Insufficient documentation

## 2020-09-08 DIAGNOSIS — R161 Splenomegaly, not elsewhere classified: Secondary | ICD-10-CM | POA: Insufficient documentation

## 2020-09-08 DIAGNOSIS — E1142 Type 2 diabetes mellitus with diabetic polyneuropathy: Secondary | ICD-10-CM | POA: Diagnosis not present

## 2020-09-08 DIAGNOSIS — G8929 Other chronic pain: Secondary | ICD-10-CM | POA: Diagnosis not present

## 2020-09-08 DIAGNOSIS — Z87891 Personal history of nicotine dependence: Secondary | ICD-10-CM | POA: Diagnosis not present

## 2020-09-08 DIAGNOSIS — C9210 Chronic myeloid leukemia, BCR/ABL-positive, not having achieved remission: Secondary | ICD-10-CM

## 2020-09-08 DIAGNOSIS — Z794 Long term (current) use of insulin: Secondary | ICD-10-CM | POA: Diagnosis not present

## 2020-09-08 LAB — CBC WITH DIFFERENTIAL/PLATELET
Abs Immature Granulocytes: 0.02 10*3/uL (ref 0.00–0.07)
Basophils Absolute: 0 10*3/uL (ref 0.0–0.1)
Basophils Relative: 1 %
Eosinophils Absolute: 0.2 10*3/uL (ref 0.0–0.5)
Eosinophils Relative: 2 %
HCT: 37.8 % — ABNORMAL LOW (ref 39.0–52.0)
Hemoglobin: 12.5 g/dL — ABNORMAL LOW (ref 13.0–17.0)
Immature Granulocytes: 0 %
Lymphocytes Relative: 26 %
Lymphs Abs: 1.6 10*3/uL (ref 0.7–4.0)
MCH: 29.1 pg (ref 26.0–34.0)
MCHC: 33.1 g/dL (ref 30.0–36.0)
MCV: 88.1 fL (ref 80.0–100.0)
Monocytes Absolute: 0.6 10*3/uL (ref 0.1–1.0)
Monocytes Relative: 9 %
Neutro Abs: 3.8 10*3/uL (ref 1.7–7.7)
Neutrophils Relative %: 62 %
Platelets: 240 10*3/uL (ref 150–400)
RBC: 4.29 MIL/uL (ref 4.22–5.81)
RDW: 15.1 % (ref 11.5–15.5)
WBC: 6.2 10*3/uL (ref 4.0–10.5)
nRBC: 0 % (ref 0.0–0.2)

## 2020-09-08 LAB — PHOSPHORUS: Phosphorus: 3.1 mg/dL (ref 2.5–4.6)

## 2020-09-08 LAB — COMPREHENSIVE METABOLIC PANEL
ALT: 33 U/L (ref 0–44)
AST: 39 U/L (ref 15–41)
Albumin: 3.3 g/dL — ABNORMAL LOW (ref 3.5–5.0)
Alkaline Phosphatase: 77 U/L (ref 38–126)
Anion gap: 7 (ref 5–15)
BUN: 14 mg/dL (ref 6–20)
CO2: 22 mmol/L (ref 22–32)
Calcium: 7.9 mg/dL — ABNORMAL LOW (ref 8.9–10.3)
Chloride: 104 mmol/L (ref 98–111)
Creatinine, Ser: 0.96 mg/dL (ref 0.61–1.24)
GFR, Estimated: >60 mL/min (ref >60)
Glucose, Bld: 201 mg/dL — ABNORMAL HIGH (ref 70–99)
Potassium: 4.2 mmol/L (ref 3.5–5.1)
Sodium: 133 mmol/L — ABNORMAL LOW (ref 135–145)
Total Bilirubin: 0.2 mg/dL — ABNORMAL LOW (ref 0.3–1.2)
Total Protein: 6 g/dL — ABNORMAL LOW (ref 6.5–8.1)

## 2020-09-08 LAB — MAGNESIUM: Magnesium: 2.2 mg/dL (ref 1.7–2.4)

## 2020-09-08 NOTE — Progress Notes (Signed)
Pt in for follow up, reports taking gleevec as prescribed except for one day.

## 2020-09-08 NOTE — Progress Notes (Signed)
[place]  Telephone:([phone] [phone]  Patient Care Team: Doreen Beam, FNP as PCP - General (Family Medicine)   Name of the patient: Gerald Olson  [id number]  01-Oct-1965   Date of visit: 09/08/20  HPI: Patient is a 55 y.o. male with newly diagnosed CML. Currently treated with imatinib, which was started on 07/28/20.  Reason for Consult: Oral chemotherapy follow-up for imatinib therapy.   PAST MEDICAL HISTORY: Past Medical History:  Diagnosis Date  . ADHD (attention deficit hyperactivity disorder)    Diagnosed during teenage years  . Alcohol use disorder    On average 14-21 drinks per week  . Alcoholic cirrhosis of liver    Per patient - condition is not related to moderate alcohol use but rather prolonged NSAID use and recent abuse.  . Allergy   . Calculus of gallbladder without cholecystitis without obstruction   . Chronic pain    Neck/lower back  . Degenerative disc disease, lumbar   . Diabetic polyneuropathy   . Essential hypertension, benign   . Generalized anxiety disorder   . Glaucoma    Per patient report  . Headache   . 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 Major depressive disorder   . Other spondylosis with radiculopathy, cervical region   . Retinopathy    Right eye; per patient report  . Type 2 diabetes mellitus with hyperglycemia     PAST SURGICAL HISTORY:  Past Surgical History:  Procedure Laterality Date  . CHOLECYSTECTOMY N/A 06/02/2018   Procedure: LAPAROSCOPIC CHOLECYSTECTOMY;  Surgeon: Vickie Epley, MD;  Location: [facility] ORS;  Service: General;  Laterality: N/A;  . KNEE ARTHROSCOPY     30 Years ago    HEMATOLOGY/ONCOLOGY HISTORY:  Oncology History   No history exists.    ALLERGIES:  is allergic to hydrocodone and oxycontin [oxycodone hcl].  MEDICATIONS:  Current Outpatient Medications  Medication Sig Dispense  Refill  . Accu-Chek FastClix Lancets MISC Use as instructed to check blood sugar up to 3 times daily. (Patient taking differently: 1 each by Other route 2 (two) times daily.) 102 each 11  . ALPHA-LIPOIC ACID PO Take by mouth.    . Blood Glucose Monitoring Suppl (ACCU-CHEK GUIDE ME) w/Device KIT 1 kit by Does not apply route 3 (three) times daily. (Patient taking differently: 1 kit by Does not apply route 2 (two) times daily.) 1 kit 0  . calcium-vitamin D (OSCAL WITH D) 500-200 MG-UNIT tablet Take 1 tablet by mouth.    Marland Kitchen glucose blood (ACCU-CHEK GUIDE) test strip Use as instructed to check blood sugar up to 3 times daily. (Patient taking differently: 1 each by Other route 2 (two) times daily.) 100 each 12  . imatinib (GLEEVEC) 400 MG tablet TAKE 1 TABLET (400 MG TOTAL) BY MOUTH DAILY. TAKE WITH MEALS AND LARGE GLASS OF WATER. 30 tablet 0  . insulin glargine (LANTUS SOLOSTAR) 100 UNIT/ML Solostar Pen Inject 20 Units into the skin every morning. And pen needles 1/day 15 mL 2  . lisinopril (ZESTRIL) 20 MG tablet Take 1 tablet (20 mg total) by mouth daily. 90 tablet 0  . metFORMIN (GLUCOPHAGE-XR) 500 MG 24 hr tablet Take 2 tablets (1,000 mg total) by mouth 2 (two) times daily with a meal. 120 tablet 2  . Multiple Vitamins-Minerals (MULTIVITAMIN WITH MINERALS) tablet Take 1 tablet by mouth daily.     . NONFORMULARY OR COMPOUNDED ITEM Apply  2 g topically 4 (four) times daily as needed (Diabetic neuropathy pain). 1 each 5  . Nutritional Supplements (DHEA PO) Take 100 mg by mouth daily.    Marland Kitchen OVER THE COUNTER MEDICATION Black seed oil 2000per day    . topiramate (TOPAMAX) 50 MG tablet Take 1 tablet (50 mg total) by mouth in the morning AND 2 tablets (100 mg total) at bedtime. Please call your specialist to schedule follow up due.. 270 tablet 3  . TRUEPLUS PEN NEEDLES 32G X 4 MM MISC 1 each by Other route daily.     Marland Kitchen UNABLE TO FIND Med Name: compound cream rx faxed to [facility] (amantadine 8%,  baclofen 2%, gabapentin 6%, amitriptyline 4%, bupivacaine 2%, clonidine 0.2%, nifedipine 2%)    . zinc gluconate 50 MG tablet Take 50 mg by mouth daily.     No current facility-administered medications for this visit.    VITAL SIGNS: There were no vitals taken for this visit. There were no vitals filed for this visit.  Estimated body mass index is 29.21 kg/m as calculated from the following:   Height as of 09/05/20: _0  (1.93 m).   Weight as of 09/05/20: 108.9 kg (240 lb).  LABS: CBC:    Component Value Date/Time   WBC 6.2 09/08/2020 1357   HGB 12.5 (L) 09/08/2020 1357   HGB 14.5 06/14/2020 1528   HCT 37.8 (L) 09/08/2020 1357   HCT 44.7 06/14/2020 1528   PLT 240 09/08/2020 1357   PLT 323 06/14/2020 1528   MCV 88.1 09/08/2020 1357   MCV 87 06/14/2020 1528   NEUTROABS 3.8 09/08/2020 1357   NEUTROABS 7.8 (H) 06/14/2020 1528   LYMPHSABS 1.6 09/08/2020 1357   LYMPHSABS 2.2 06/14/2020 1528   MONOABS 0.6 09/08/2020 1357   EOSABS 0.2 09/08/2020 1357   EOSABS 0.1 06/14/2020 1528   BASOSABS 0.0 09/08/2020 1357   BASOSABS 0.1 06/14/2020 1528   Comprehensive Metabolic Panel:    Component Value Date/Time   NA 134 06/14/2020 1528   K 4.1 06/14/2020 1528   CL 98 06/14/2020 1528   CO2 21 06/14/2020 1528   BUN 13 06/14/2020 1528   CREATININE 1.10 07/18/2020 0928   CREATININE 0.80 01/12/2019 1452   GLUCOSE 114 (H) 06/14/2020 1528   GLUCOSE 308 (H) 01/12/2019 1452   CALCIUM 9.2 06/14/2020 1528   AST 47 (H) 06/14/2020 1528   ALT 38 06/14/2020 1528   ALKPHOS 105 06/14/2020 1528   BILITOT 0.7 06/14/2020 1528   PROT 6.8 06/14/2020 1528   ALBUMIN 4.3 06/14/2020 1528    RADIOGRAPHIC STUDIES: No results found.   Assessment and Plan-  Continue imatinib 449m   Oral Chemotherapy Side Effect/Intolerance:  Potential side effects reviewed, none reported.  He did realize that if he takes the imatinib with too small of a meal he will have so GI upset  Oral Chemotherapy Adherence:  patient reported one missed dose since starting the imatinib. The missed dose occurred on a day he was having back pain and was in bed most of the day.  Medication Access Issues: no issues, patient fills at [facility] They will call him on 1/20 to set-up his next medication shipment.  No patient barriers to medication adherence identified.   Other:  -He is still waiting to be cleared for his back surgery, waiting on diabetes control  Patient expressed understanding and was in agreement with this plan. He also understands that He can call clinic at any time with any questions,  concerns, or complaints.   Thank you for allowing me to participate in the care of this very pleasant patient.   Time Total: 15 mins  Visit consisted of counseling and education on dealing with issues of symptom management in the setting of serious and potentially life-threatening illness.Greater than 50%  of this time was spent counseling and coordinating care related to the above assessment and plan.  Signed by: [name], PharmD, BCPS, Salley Slaughter, CPP Hematology/Oncology Clinical Pharmacist Practitioner [facility]/HP/AP [facility] [phone]  09/08/2020 2:09 PM

## 2020-09-09 ENCOUNTER — Other Ambulatory Visit: Payer: Self-pay | Admitting: Endocrinology

## 2020-09-12 MED FILL — TRUEplus 5-BEVEL PEN NEEDLE: 32G X 4 MM | 34 days supply | Qty: 100 | Fill #0

## 2020-09-13 ENCOUNTER — Other Ambulatory Visit: Payer: Self-pay | Admitting: Oncology

## 2020-09-13 DIAGNOSIS — C9210 Chronic myeloid leukemia, BCR/ABL-positive, not having achieved remission: Secondary | ICD-10-CM

## 2020-09-14 ENCOUNTER — Other Ambulatory Visit: Payer: Self-pay

## 2020-09-14 ENCOUNTER — Other Ambulatory Visit: Payer: Self-pay | Admitting: Adult Health

## 2020-09-14 ENCOUNTER — Encounter: Payer: Self-pay | Admitting: Adult Health

## 2020-09-14 ENCOUNTER — Ambulatory Visit (INDEPENDENT_AMBULATORY_CARE_PROVIDER_SITE_OTHER): Payer: Medicaid Other | Admitting: Adult Health

## 2020-09-14 VITALS — BP 173/99 | HR 101 | Temp 99.0°F | Resp 16 | Wt 235.2 lb

## 2020-09-14 DIAGNOSIS — K7030 Alcoholic cirrhosis of liver without ascites: Secondary | ICD-10-CM | POA: Diagnosis not present

## 2020-09-14 DIAGNOSIS — M5442 Lumbago with sciatica, left side: Secondary | ICD-10-CM

## 2020-09-14 DIAGNOSIS — I10 Essential (primary) hypertension: Secondary | ICD-10-CM | POA: Diagnosis not present

## 2020-09-14 DIAGNOSIS — G8929 Other chronic pain: Secondary | ICD-10-CM

## 2020-09-14 DIAGNOSIS — F332 Major depressive disorder, recurrent severe without psychotic features: Secondary | ICD-10-CM

## 2020-09-14 DIAGNOSIS — F0634 Mood disorder due to known physiological condition with mixed features: Secondary | ICD-10-CM

## 2020-09-14 DIAGNOSIS — M5441 Lumbago with sciatica, right side: Secondary | ICD-10-CM

## 2020-09-14 DIAGNOSIS — F1211 Cannabis abuse, in remission: Secondary | ICD-10-CM | POA: Diagnosis not present

## 2020-09-14 DIAGNOSIS — F1020 Alcohol dependence, uncomplicated: Secondary | ICD-10-CM

## 2020-09-14 DIAGNOSIS — R399 Unspecified symptoms and signs involving the genitourinary system: Secondary | ICD-10-CM | POA: Insufficient documentation

## 2020-09-14 MED ORDER — TRAMADOL HCL 50 MG PO TABS: 50.0000 mg | ORAL_TABLET | Freq: Two times a day (BID) | ORAL | 0 refills | Status: DC | PRN

## 2020-09-14 MED ORDER — PREDNISONE 10 MG (21) PO TBPK: ORAL_TABLET | ORAL | 0 refills | Status: DC

## 2020-09-14 MED FILL — predniSONE 10 MG TABS: 10 | 6 days supply | Qty: 21 | Fill #0

## 2020-09-14 NOTE — Patient Instructions (Addendum)
You should hear from pain management within one to two  week, I will refill Tramadol today however will leave at the discretion of orthpedics or pain management from here forward. Please call your orthopedic today for this back pain today.   [facility] at [facility] Monday to Friday from 1pm to 7pm.   Chronic Back Pain When back pain lasts longer than 3 months, it is called chronic back pain. Pain may get worse at certain times (flare-ups). There are things you can do at home to manage your pain. Follow these instructions at home: Pay attention to any changes in your symptoms. Take these actions to help with your pain: Managing pain and stiffness  If told, put ice on the painful area. Your doctor may tell you to use ice for 24-48 hours after the flare-up starts. To do this: ? Put ice in a plastic bag. ? Place a towel between your skin and the bag. ? Leave the ice on for 20 minutes, 2-3 times a day.  If told, put heat on the painful area. Do this as often as told by your doctor. Use the heat source that your doctor recommends, such as a moist heat pack or a heating pad. ? Place a towel between your skin and the heat source. ? Leave the heat on for 20-30 minutes. ? Take off the heat if your skin turns bright red. This is especially important if you are unable to feel pain, heat, or cold. You may have a greater risk of getting burned.  Soak in a warm bath. This can help relieve pain.      Activity  Avoid bending and other activities that make pain worse.  When standing: ? Keep your upper back and neck straight. ? Keep your shoulders pulled back. ? Avoid slouching.  When sitting: ? Keep your back straight. ? Relax your shoulders. Do not round your shoulders or pull them backward.  Do not sit or stand in one place for long periods of time.  Take short rest breaks during the day. Lying down or standing is usually better than sitting. Resting can help relieve pain.  When  sitting or lying down for a long time, do some mild activity or stretching. This will help to prevent stiffness and pain.  Get regular exercise. Ask your doctor what activities are safe for you.  Do not lift anything that is heavier than 10 lb (4.5 kg) or the limit that you are told, until your doctor says that it is safe.  To prevent injury when you lift things: ? Bend your knees. ? Keep the weight close to your body. ? Avoid twisting.  Sleep on a firm mattress. Try lying on your side with your knees slightly bent. If you lie on your back, put a pillow under your knees.   Medicines  Treatment may include medicines for pain and swelling taken by mouth or put on the skin, prescription pain medicine, or muscle relaxants.  Take over-the-counter and prescription medicines only as told by your doctor.  Ask your doctor if the medicine prescribed to you: ? Requires you to avoid driving or using machinery. ? Can cause trouble pooping (constipation). You may need to take these actions to prevent or treat trouble pooping:  Drink enough fluid to keep your pee (urine) pale yellow.  Take over-the-counter or prescription medicines.  Eat foods that are high in fiber. These include beans, whole grains, and fresh fruits and vegetables.  Limit foods  that are high in fat and sugars. These include fried or sweet foods. General instructions  Do not use any products that contain nicotine or tobacco, such as cigarettes, e-cigarettes, and chewing tobacco. If you need help quitting, ask your doctor.  Keep all follow-up visits as told by your doctor. This is important. Contact a doctor if:  Your pain does not get better with rest or medicine.  Your pain gets worse, or you have new pain.  You have a high fever.  You lose weight very quickly.  You have trouble doing your normal activities. Get help right away if:  One or both of your legs or feet feel weak.  One or both of your legs or feet lose  feeling (have numbness).  You have trouble controlling when you poop (have a bowel movement) or pee (urinate).  You have bad back pain and: ? You feel like you may vomit (nauseous), or you vomit. ? You have pain in your belly (abdomen). ? You have shortness of breath. ? You faint. Summary  When back pain lasts longer than 3 months, it is called chronic back pain.  Pain may get worse at certain times (flare-ups).  Use ice and heat as told by your doctor. Your doctor may tell you to use ice after flare-ups. This information is not intended to replace advice given to you by your health care provider. Make sure you discuss any questions you have with your health care provider. Document Revised: 09/23/2019 Document Reviewed: 09/23/2019 Elsevier Patient Education  2021 [employer].

## 2020-09-14 NOTE — Progress Notes (Signed)
Established patient visit   Patient: Gerald Olson   DOB: 1966-03-07   55 y.o. Male  MRN: [id number] Visit Date: 09/14/2020  Today's healthcare provider: Marcille Buffy, FNP   Chief Complaint  Patient presents with   Diabetes   Subjective    HPI  Diabetes Mellitus Type II, Follow-up- to clarify Dr. Loanne [name], following diabetes.  He has improved.   Lab Results  Component Value Date   HGBA1C 7.0 (A) 09/05/2020   HGBA1C 7.4 (H) 06/14/2020   HGBA1C 7.3 (A) 05/04/2020   Wt Readings from Last 3 Encounters:  09/14/20 235 lb 3.2 oz (106.7 kg)  09/08/20 236 lb (107 kg)  09/05/20 240 lb (108.9 kg)   Last seen for diabetes 3 months ago.  Management since then includes labs ordered. He reports good compliance with treatment. He is not having side effects.  Symptoms: No fatigue No foot ulcerations  No appetite changes No nausea  No paresthesia of the feet  No polydipsia  No polyuria Yes visual disturbances   No vomiting     Home blood sugar records: 145-180  Episodes of hypoglycemia? No    Current insulin regiment: patient reports that he takes 22units of Lantus Most Recent Eye Exam: >39months. Patient reports that he has eye examined scheduled 12/2020 Current exercise: none Current diet habits: well balanced  Pertinent Labs: Lab Results  Component Value Date   CHOL 122 06/14/2020   HDL 34 (L) 06/14/2020   LDLCALC 72 06/14/2020   TRIG 77 06/14/2020   CHOLHDL 3.6 06/14/2020   Lab Results  Component Value Date   NA 139 09/14/2020   K 4.1 09/14/2020   CREATININE 1.07 09/14/2020   GFRNONAA 78 09/14/2020   GFRAA 90 09/14/2020   GLUCOSE 181 (H) 09/14/2020      He has back pain he is being seen by orthopedics, [facility] Com[facility] and also seeing Dr. [name] at orthopedics. He does have some paresthesia's in bilateral legs and orthopedic is following, he is waiting approval of MRI on back.  He has stopped Physical therapy at this time.  He reports  Gabapentin made him dizzy and he did not like side effects.He is taking Alpha Lipoic acid.  He has not heard from pain management referral.   He has follow up with [facility] after MRI.   Patient  denies any fever, chills, rash, chest pain, shortness of breath, nausea, vomiting, or diarrhea.  Denies dizziness, lightheadedness, pre syncopal or syncopal episodes.   Sees Dr. 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 08/09/2020   No-show for appointment 08/04/2020   Occult blood in stools 06/15/2020   Mesenteric lymphadenopathy 06/14/2020   Splenomegaly- mild  06/14/2020   Screening for blood or protein in urine 06/14/2020   Elevated blood pressure reading 06/14/2020   History of cannabis abuse 06/14/2020   Recurrent major depressive disorder, in partial remission ([place]) 06/09/2020   Anxiety 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 06/09/2020   Alcohol use disorder, moderate, dependence ([place]) 06/09/2020   Spinal stenosis of cervical region 06/03/2020   Frequent falls 05/23/2020   Foraminal stenosis of cervical region 05/09/2020   Diabetic polyneuropathy    ADHD (attention deficit hyperactivity disorder)  Generalized anxiety disorder    Major depressive disorder    Other spondylosis with radiculopathy, cervical region 09/22/2019   Low back pain 07/28/2019   Essential hypertension, benign 06/20/8526   Alcoholic cirrhosis of liver 06/02/2018   Type 2 diabetes mellitus with hyperglycemia 04/25/2018   Alcohol use disorder, mild, abuse 04/25/2018   Past Medical History:  Diagnosis Date   ADHD (attention  deficit hyperactivity disorder)    Diagnosed during teenage years   Alcohol use disorder    On average 14-21 drinks per week   Alcoholic cirrhosis of liver    Per patient - condition is not related to moderate alcohol use but rather prolonged NSAID use and recent abuse.   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   Oxycontin [Oxycodone Hcl] Itching       Medications: Outpatient Medications Prior to Visit  Medication Sig Note   Accu-Chek FastClix Lancets MISC Use as instructed to check blood sugar up to 3 times daily. (Patient taking differently: 1 each by Other route 2 (two) times daily.)    ALPHA-LIPOIC ACID PO Take by mouth.    Blood Glucose Monitoring Suppl (ACCU-CHEK GUIDE ME) w/Device KIT 1 kit by Does not apply route 3 (three) times daily. (Patient taking differently: 1 kit by Does not apply route 2 (two) times daily.)    calcium-vitamin D (OSCAL WITH D) 500-200 MG-UNIT tablet Take 1 tablet by mouth. 09/14/2020: Patient reports that he is on 5000IU   glucose blood (ACCU-CHEK GUIDE) test strip Use as instructed to check blood sugar up to 3 times daily. (Patient taking differently: 1 each by Other route 2 (two) times daily.)    imatinib (GLEEVEC) 400 MG tablet TAKE 1 TABLET (400 MG TOTAL) BY MOUTH DAILY. TAKE WITH MEALS AND LARGE GLASS OF WATER.     insulin glargine (LANTUS SOLOSTAR) 100 UNIT/ML Solostar Pen Inject 20 Units into the skin every morning. And pen needles 1/day (Patient taking differently: Inject 22 Units into the skin every morning. And pen needles 1/day)    lisinopril (ZESTRIL) 20 MG tablet Take 1 tablet (20 mg total) by mouth daily.    metFORMIN (GLUCOPHAGE-XR) 500 MG 24 hr tablet Take 2 tablets (1,000 mg total) by mouth 2 (two) times daily with a meal.    Multiple Vitamins-Minerals (MULTIVITAMIN WITH MINERALS) tablet Take 1 tablet by mouth daily.     Nutritional Supplements (DHEA PO) Take 100 mg by mouth daily.    OVER THE COUNTER MEDICATION Black seed oil 2000per day    topiramate (TOPAMAX) 50 MG tablet Take 1 tablet (50 mg total) by mouth in the morning AND 2 tablets (100 mg total) at bedtime. Please call your specialist to schedule follow up due..    TRUEPLUS PEN NEEDLES 32G X 4 MM MISC USE TO INJECT  VICTOZA ONCE DAILY.    UNABLE TO FIND Med Name: compound cream rx faxed to [facility] (amantadine 8%, baclofen 2%, gabapentin 6%, amitriptyline 4%, bupivacaine 2%, clonidine 0.2%, nifedipine 2%)    zinc gluconate 50 MG tablet Take 50 mg by mouth daily.    NONFORMULARY OR COMPOUNDED ITEM Apply 2 g topically 4 (four) times daily as needed (Diabetic neuropathy pain). (Patient not taking: Reported on 09/14/2020)    No facility-administered medications prior to visit.    Review of Systems  Constitutional: Negative.   HENT: Negative.   Respiratory: Negative.   Cardiovascular: Negative.   Gastrointestinal: Negative.   Genitourinary: Negative.   Musculoskeletal: Positive for arthralgias, back pain and gait problem (walks with cane chronic/ chronic back pain ). Negative for joint swelling, myalgias, neck pain and neck stiffness.  Neurological: Positive for numbness (paresthesias lower extremities, awaiting MRI ). Negative for dizziness, tremors, seizures, syncope, facial asymmetry, speech difficulty,  weakness, light-headedness and headaches.  Psychiatric/Behavioral: Negative.     Last CBC Lab Results  Component Value Date   WBC 6.4 09/14/2020   HGB 14.1 09/14/2020   HCT 42.3 09/14/2020   MCV 88 09/14/2020   MCH 29.4 09/14/2020   RDW 14.4 09/14/2020   PLT 263 09/14/2020   Last metabolic panel Lab Results  Component Value Date   GLUCOSE 181 (H) 09/14/2020   NA 139 09/14/2020   K 4.1 09/14/2020   CL 106 09/14/2020   CO2 20 09/14/2020   BUN 9 09/14/2020   CREATININE 1.07 09/14/2020   GFRNONAA 78 09/14/2020   GFRAA 90 09/14/2020   CALCIUM 8.9 09/14/2020   PHOS 3.1 09/08/2020   PROT 6.8 09/14/2020   ALBUMIN 4.2 09/14/2020   LABGLOB 2.6 09/14/2020   AGRATIO 1.6 09/14/2020   BILITOT 0.6 09/14/2020   ALKPHOS 93 09/14/2020   AST 41 (H) 09/14/2020   ALT 36 09/14/2020   ANIONGAP 7 09/08/2020   Last lipids Lab Results  Component Value Date   CHOL 122 06/14/2020   HDL 34 (L) 06/14/2020   LDLCALC 72 06/14/2020   TRIG 77 06/14/2020   CHOLHDL 3.6 06/14/2020   Last hemoglobin A1c Lab Results  Component Value Date   HGBA1C 7.0 (A) 09/05/2020   Last thyroid functions Lab Results  Component Value Date   TSH 3.970 06/27/2020   T4TOTAL 5.2 06/27/2020   Last vitamin D Lab Results  Component Value Date   VD25OH 114.0 (H) 10/08/2019   Last vitamin B12 and Folate Lab Results  Component Value Date   VITAMINB12 540 06/27/2020       Objective    BP (!) 173/99    Pulse (!) 101    Temp 99 F (37.2 C) (Oral)    Resp 16    Wt 235 lb 3.2 oz (106.7 kg)    SpO2 99%    BMI 28.63 kg/m  BP Readings from Last 3 Encounters:  09/14/20 (!) 173/99  09/08/20 133/83  09/05/20 132/88   Wt Readings from Last 3 Encounters:  09/14/20 235 lb 3.2 oz (106.7 kg)  09/08/20 236 lb (107 kg)  09/05/20 240 lb (108.9 kg)       Physical Exam Vitals reviewed.  Constitutional:      General: He is not in acute distress.    Appearance: Normal appearance. He is not ill-appearing,  toxic-appearing or diaphoretic.     Comments: Patient is alert and oriented and responsive to questions Engages in eye contact with provider. Speaks in full sentences without any  pauses without any shortness of breath or distress.   HENT:     Head: Normocephalic and atraumatic.     Mouth/Throat:     Mouth: Mucous membranes are moist.  Eyes:     Pupils: Pupils are equal, round, and reactive to light.  Cardiovascular:     Rate and Rhythm: Normal rate and regular rhythm.     Pulses: Normal pulses.     Heart sounds: Normal heart sounds.  Pulmonary:     Effort: Pulmonary effort is normal.  Abdominal:     General: There is no distension.     Palpations: Abdomen is soft.     Tenderness: There is no abdominal tenderness.  Musculoskeletal:        General: Normal range of motion.     Cervical back: Normal.     Thoracic back: Tenderness present.     Lumbar back: Tenderness present.       Back:     Comments: Always walks with cane.   No rash.   Skin:    General: Skin is warm.     Capillary Refill: Capillary refill takes less than 2 seconds.     Coloration: Skin is not jaundiced or pale.     Findings: No bruising, erythema, lesion or rash.  Neurological:     Mental Status: He is oriented to person, place, and time.     GCS: GCS eye subscore is 4. GCS verbal subscore is 5. GCS motor subscore is 6.     Motor: Motor function is intact.     Coordination: Coordination is intact.     Deep Tendon Reflexes:     Reflex Scores:      Bicep reflexes are 2+ on the right side and 2+ on the left side.      Patellar reflexes are 2+ on the right side and 2+ on the left side. Psychiatric:        Mood and Affect: Mood normal.        Behavior: Behavior normal.        Thought Content: Thought content normal.        Judgment: Judgment normal.      Results for orders placed or performed in visit on 09/14/20  CBC with Differential/Platelet  Result Value Ref Range   WBC 6.4 3.4 - 10.8 x10E3/uL   RBC  4.80 4.14 - 5.80 x10E6/uL   Hemoglobin 14.1 13.0 - 17.7 g/dL   Hematocrit 42.3 37.5 - 51.0 %   MCV 88 79 - 97 fL   MCH 29.4 26.6 - 33.0 pg   MCHC 33.3 31.5 - 35.7 g/dL   RDW 14.4 11.6 - 15.4 %   Platelets 263 150 - 450 x10E3/uL   Neutrophils 61 Not Estab. %   Lymphs 26 Not Estab. %   Monocytes 10 Not Estab. %   Eos 2 Not Estab. %   Basos 1 Not Estab. %   Neutrophils Absolute 3.9 1.4 - 7.0 x10E3/uL   Lymphocytes Absolute 1.7 0.7 - 3.1 x10E3/uL   Monocytes Absolute 0.6 0.1 - 0.9 x10E3/uL   EOS (ABSOLUTE) 0.1 0.0 - 0.4 x10E3/uL   Basophils Absolute 0.0 0.0 - 0.2 x10E3/uL   Immature Granulocytes 0 Not Estab. %   Immature Grans (Abs) 0.0 0.0 - 0.1 x10E3/uL  Comprehensive Metabolic Panel (CMET)  Result Value Ref Range   Glucose 181 (H) 65 - 99 mg/dL   BUN 9 6 - 24 mg/dL   Creatinine, Ser 1.07 0.76 - 1.27 mg/dL  GFR calc non Af Amer 78 >59 mL/min/1.73   GFR calc Af Amer 90 >59 mL/min/1.73   BUN/Creatinine Ratio 8 (L) 9 - 20   Sodium 139 134 - 144 mmol/L   Potassium 4.1 3.5 - 5.2 mmol/L   Chloride 106 96 - 106 mmol/L   CO2 20 20 - 29 mmol/L   Calcium 8.9 8.7 - 10.2 mg/dL   Total Protein 6.8 6.0 - 8.5 g/dL   Albumin 4.2 3.8 - 4.9 g/dL   Globulin, Total 2.6 1.5 - 4.5 g/dL   Albumin/Globulin Ratio 1.6 1.2 - 2.2   Bilirubin Total 0.6 0.0 - 1.2 mg/dL   Alkaline Phosphatase 93 44 - 121 IU/L   AST 41 (H) 0 - 40 IU/L   ALT 36 0 - 44 IU/L  Drug Screen 12+Alcohol+CRT, Ur  Result Value Ref Range   Ethanol, Urine Negative Cutoff=0.020 %   Amphetamines, Urine WILL FOLLOW    Barbiturate WILL FOLLOW    BENZODIAZ UR QL WILL FOLLOW    Cannabinoids WILL FOLLOW    Cocaine (Metabolite) WILL FOLLOW    OPIATE SCREEN URINE WILL FOLLOW    Oxycodone/Oxymorphone, Urine WILL FOLLOW    Phencyclidine WILL FOLLOW    Methadone WILL FOLLOW    Propoxyphene WILL FOLLOW    Meperidine WILL FOLLOW    Tramadol WILL FOLLOW    Creatinine, Urine WILL FOLLOW   POCT urinalysis dipstick  Result Value Ref  Range   Color, UA yellow    Clarity, UA clear    Glucose, UA Negative Negative   Bilirubin, UA small    Ketones, UA large    Spec Grav, UA >=1.030 (A) 1.010 - 1.025   Blood, UA negative    pH, UA 5.0 5.0 - 8.0   Protein, UA Positive (A) Negative   Urobilinogen, UA 0.2 0.2 or 1.0 E.U./dL   Nitrite, UA negative    Leukocytes, UA Negative Negative   Appearance     Odor      Assessment & Plan     Essential hypertension, benign  Alcoholic cirrhosis, unspecified whether ascites present (HCC) - Plan: CBC with Differential/Platelet, Comprehensive Metabolic Panel (CMET), Drug Screen 12+Alcohol+CRT, Ur, POCT urinalysis dipstick  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   Meds ordered this encounter  Medications   predniSONE (STERAPRED UNI-PAK 21 TAB) 10 MG (21) TBPK tablet    Sig: PO: Take 6 tablets on day 1:Take 5 tablets day 2:Take 4 tablets day 3: Take 3 tablets day 4:Take 2 tablets day five: 5 Take 1 tablet day 6    Dispense:  21 tablet    Refill:  0   traMADol (ULTRAM) 50 MG tablet    Sig: Take 1 tablet (50 mg total) by mouth every 12 (twelve) hours as needed for severe pain.    Dispense:  30 tablet    Refill:  0    Orders Placed This Encounter  Procedures   CBC with Differential/Platelet   Comprehensive Metabolic Panel (CMET)   Drug Screen 12+Alcohol+CRT, Ur   POCT urinalysis dipstick  schedule soon follow up with psychiatry.  Keep diabetes follow up with Dr. Loanne [name]. Endocrinology/  Return in about 1 month (around 10/15/2020), or if symptoms worsen or fail to improve, for at any  time for any worsening symptoms, Go to Emergency room/ urgent care if worse.     The entirety of the information documented in the History of Present Illness, Review of Systems and Physical Exam were personally obtained by me. Portions of this information were initially documented by the CMA and reviewed by me for thoroughness and accuracy.   Red Flags discussed. The patient was given clear instructions to go to ER or return to medical center if any red flags develop, symptoms do not improve, worsen or new problems develop. They verbalized understanding.  You should hear from pain management within one to two  week, I will refill Tramadol today however will leave at the discretion of orthpedics or pain management from here forward. Please call your orthopedic today for this back pain today.   [facility] is at [facility] Monday to Friday from 1pm to [place], [place] [phone] (phone) [phone] (fax)  [place]

## 2020-09-15 LAB — POCT URINALYSIS DIPSTICK
Blood, UA: NEGATIVE
Glucose, UA: NEGATIVE
Leukocytes, UA: NEGATIVE
Nitrite, UA: NEGATIVE
Protein, UA: POSITIVE — AB
Spec Grav, UA: >=1.03 — AB (ref 1.010–1.025)
Urobilinogen, UA: 0.2 E.U./dL
pH, UA: 5 (ref 5.0–8.0)

## 2020-09-15 LAB — CBC WITH DIFFERENTIAL/PLATELET
Basophils Absolute: 0 10*3/uL (ref 0.0–0.2)
Basos: 1 %
EOS (ABSOLUTE): 0.1 10*3/uL (ref 0.0–0.4)
Eos: 2 %
Hematocrit: 42.3 % (ref 37.5–51.0)
Hemoglobin: 14.1 g/dL (ref 13.0–17.7)
Immature Grans (Abs): 0 10*3/uL (ref 0.0–0.1)
Immature Granulocytes: 0 %
Lymphocytes Absolute: 1.7 10*3/uL (ref 0.7–3.1)
Lymphs: 26 %
MCH: 29.4 pg (ref 26.6–33.0)
MCHC: 33.3 g/dL (ref 31.5–35.7)
MCV: 88 fL (ref 79–97)
Monocytes Absolute: 0.6 10*3/uL (ref 0.1–0.9)
Monocytes: 10 %
Neutrophils Absolute: 3.9 10*3/uL (ref 1.4–7.0)
Neutrophils: 61 %
Platelets: 263 10*3/uL (ref 150–450)
RBC: 4.8 x10E6/uL (ref 4.14–5.80)
RDW: 14.4 % (ref 11.6–15.4)
WBC: 6.4 10*3/uL (ref 3.4–10.8)

## 2020-09-15 LAB — COMPREHENSIVE METABOLIC PANEL
ALT: 36 IU/L (ref 0–44)
AST: 41 IU/L — ABNORMAL HIGH (ref 0–40)
Albumin/Globulin Ratio: 1.6 (ref 1.2–2.2)
Albumin: 4.2 g/dL (ref 3.8–4.9)
Alkaline Phosphatase: 93 IU/L (ref 44–121)
BUN/Creatinine Ratio: 8 — ABNORMAL LOW (ref 9–20)
BUN: 9 mg/dL (ref 6–24)
Bilirubin Total: 0.6 mg/dL (ref 0.0–1.2)
CO2: 20 mmol/L (ref 20–29)
Calcium: 8.9 mg/dL (ref 8.7–10.2)
Chloride: 106 mmol/L (ref 96–106)
Creatinine, Ser: 1.07 mg/dL (ref 0.76–1.27)
GFR calc Af Amer: 90 mL/min/{1.73_m2} (ref >59)
GFR calc non Af Amer: 78 mL/min/{1.73_m2} (ref >59)
Globulin, Total: 2.6 g/dL (ref 1.5–4.5)
Glucose: 181 mg/dL — ABNORMAL HIGH (ref 65–99)
Potassium: 4.1 mmol/L (ref 3.5–5.2)
Sodium: 139 mmol/L (ref 134–144)
Total Protein: 6.8 g/dL (ref 6.0–8.5)

## 2020-09-15 MED FILL — traMADol HCL 50 MG TABS: 50 | 5 days supply | Qty: 10 | Fill #0

## 2020-09-15 NOTE — Progress Notes (Signed)
Labs have improved from last check, glucose still high, he was not fasting, follow endocrinologist advice.  AST minimally elevated, recommend avoiding alcohol as well.

## 2020-09-15 NOTE — Progress Notes (Signed)
Urin drug test returned, positive for alcohol which he reported and we discussed importance of cessation and positive for Tramadol consistent with what he is being prescribed, Sent Ultram to pharmacy take only as directed and given 30 tablets. Awaiting pain management and was already advise to call his orthopedic.

## 2020-09-20 LAB — BCR-ABL1, CML/ALL, PCR, QUANT: Interpretation (BCRAL):: NEGATIVE

## 2020-09-22 MED FILL — IMATINIB MESYLATE 400 MG TA: 400 | 30 days supply | Qty: 30 | Fill #0

## 2020-09-26 LAB — DRUG SCREEN 12+ALCOHOL+CRT, UR
Amphetamines, Urine: NEGATIVE
BENZODIAZ UR QL: NEGATIVE ng/mL
Barbiturate: NEGATIVE ng/mL
Cannabinoids: NEGATIVE ng/mL
Cocaine (Metabolite): NEGATIVE ng/mL
Creatinine, Urine: 349.3 mg/dL — ABNORMAL HIGH (ref 20.0–300.0)
Ethanol, Urine: NEGATIVE %
Meperidine: NEGATIVE ng/mL
Methadone: NEGATIVE ng/mL
OPIATE SCREEN URINE: NEGATIVE ng/mL
Oxycodone/Oxymorphone, Urine: NEGATIVE ng/mL
Phencyclidine: NEGATIVE ng/mL
Propoxyphene: NEGATIVE ng/mL
Tramadol: NEGATIVE ng/mL

## 2020-09-28 ENCOUNTER — Other Ambulatory Visit: Payer: Self-pay | Admitting: Urology

## 2020-09-28 DIAGNOSIS — M546 Pain in thoracic spine: Secondary | ICD-10-CM | POA: Diagnosis not present

## 2020-10-12 ENCOUNTER — Telehealth: Payer: Self-pay | Admitting: Orthopaedic Surgery

## 2020-10-12 NOTE — Telephone Encounter (Signed)
2nd request received from [facility]. Again sent to [facility].

## 2020-10-14 DIAGNOSIS — M5416 Radiculopathy, lumbar region: Secondary | ICD-10-CM | POA: Diagnosis not present

## 2020-10-18 ENCOUNTER — Ambulatory Visit: Payer: Medicaid Other | Admitting: Adult Health

## 2020-10-18 ENCOUNTER — Other Ambulatory Visit: Payer: Self-pay

## 2020-10-18 ENCOUNTER — Encounter: Payer: Self-pay | Admitting: Adult Health

## 2020-10-18 ENCOUNTER — Other Ambulatory Visit: Payer: Self-pay | Admitting: Adult Health

## 2020-10-18 ENCOUNTER — Other Ambulatory Visit: Payer: Self-pay | Admitting: Oncology

## 2020-10-18 VITALS — BP 132/92 | HR 97 | Resp 16 | Wt 234.6 lb

## 2020-10-18 DIAGNOSIS — M545 Low back pain, unspecified: Secondary | ICD-10-CM

## 2020-10-18 DIAGNOSIS — C9210 Chronic myeloid leukemia, BCR/ABL-positive, not having achieved remission: Secondary | ICD-10-CM

## 2020-10-18 DIAGNOSIS — K7030 Alcoholic cirrhosis of liver without ascites: Secondary | ICD-10-CM

## 2020-10-18 DIAGNOSIS — F1020 Alcohol dependence, uncomplicated: Secondary | ICD-10-CM | POA: Diagnosis not present

## 2020-10-18 DIAGNOSIS — F332 Major depressive disorder, recurrent severe without psychotic features: Secondary | ICD-10-CM

## 2020-10-18 DIAGNOSIS — E1165 Type 2 diabetes mellitus with hyperglycemia: Secondary | ICD-10-CM

## 2020-10-18 MED ORDER — TRAMADOL HCL 50 MG PO TABS: 50.0000 mg | ORAL_TABLET | Freq: Three times a day (TID) | ORAL | 0 refills | Status: DC | PRN

## 2020-10-18 MED ORDER — ACCU-CHEK FASTCLIX LANCETS MISC: 1.0000 | Freq: Two times a day (BID) | 0 refills | Status: DC

## 2020-10-18 MED ORDER — ACCU-CHEK GUIDE VI STRP: 1.0000 | ORAL_STRIP | Freq: Two times a day (BID) | 0 refills | Status: DC

## 2020-10-18 MED ORDER — LANTUS SOLOSTAR 100 UNIT/ML ~~LOC~~ SOPN: 22.0000 [IU] | PEN_INJECTOR | SUBCUTANEOUS | Status: DC

## 2020-10-18 MED ORDER — ACCU-CHEK GUIDE ME W/DEVICE KIT: 1.0000 | PACK | Freq: Three times a day (TID) | 0 refills | Status: DC

## 2020-10-18 NOTE — Patient Instructions (Signed)
Pain management Dr. Holley Raring should be calling you within next two weeks. ' Keep orthopedic appointment this week, will defer pain management to orthopedics and pain medicine.   Chronic Back Pain When back pain lasts longer than 3 months, it is called chronic back pain. Pain may get worse at certain times (flare-ups). There are things you can do at home to manage your pain. Follow these instructions at home: Pay attention to any changes in your symptoms. Take these actions to help with your pain: Managing pain and stiffness  If told, put ice on the painful area. Your doctor may tell you to use ice for 24-48 hours after the flare-up starts. To do this: ? Put ice in a plastic bag. ? Place a towel between your skin and the bag. ? Leave the ice on for 20 minutes, 2-3 times a day.  If told, put heat on the painful area. Do this as often as told by your doctor. Use the heat source that your doctor recommends, such as a moist heat pack or a heating pad. ? Place a towel between your skin and the heat source. ? Leave the heat on for 20-30 minutes. ? Take off the heat if your skin turns bright red. This is especially important if you are unable to feel pain, heat, or cold. You may have a greater risk of getting burned.  Soak in a warm bath. This can help relieve pain.      Activity  Avoid bending and other activities that make pain worse.  When standing: ? Keep your upper back and neck straight. ? Keep your shoulders pulled back. ? Avoid slouching.  When sitting: ? Keep your back straight. ? Relax your shoulders. Do not round your shoulders or pull them backward.  Do not sit or stand in one place for long periods of time.  Take short rest breaks during the day. Lying down or standing is usually better than sitting. Resting can help relieve pain.  When sitting or lying down for a long time, do some mild activity or stretching. This will help to prevent stiffness and pain.  Get regular  exercise. Ask your doctor what activities are safe for you.  Do not lift anything that is heavier than 10 lb (4.5 kg) or the limit that you are told, until your doctor says that it is safe.  To prevent injury when you lift things: ? Bend your knees. ? Keep the weight close to your body. ? Avoid twisting.  Sleep on a firm mattress. Try lying on your side with your knees slightly bent. If you lie on your back, put a pillow under your knees.   Medicines  Treatment may include medicines for pain and swelling taken by mouth or put on the skin, prescription pain medicine, or muscle relaxants.  Take over-the-counter and prescription medicines only as told by your doctor.  Ask your doctor if the medicine prescribed to you: ? Requires you to avoid driving or using machinery. ? Can cause trouble pooping (constipation). You may need to take these actions to prevent or treat trouble pooping:  Drink enough fluid to keep your pee (urine) pale yellow.  Take over-the-counter or prescription medicines.  Eat foods that are high in fiber. These include beans, whole grains, and fresh fruits and vegetables.  Limit foods that are high in fat and sugars. These include fried or sweet foods. General instructions  Do not use any products that contain nicotine or tobacco, such as cigarettes,  e-cigarettes, and chewing tobacco. If you need help quitting, ask your doctor.  Keep all follow-up visits as told by your doctor. This is important. Contact a doctor if:  Your pain does not get better with rest or medicine.  Your pain gets worse, or you have new pain.  You have a high fever.  You lose weight very quickly.  You have trouble doing your normal activities. Get help right away if:  One or both of your legs or feet feel weak.  One or both of your legs or feet lose feeling (have numbness).  You have trouble controlling when you poop (have a bowel movement) or pee (urinate).  You have bad back  pain and: ? You feel like you may vomit (nauseous), or you vomit. ? You have pain in your belly (abdomen). ? You have shortness of breath. ? You faint. Summary  When back pain lasts longer than 3 months, it is called chronic back pain.  Pain may get worse at certain times (flare-ups).  Use ice and heat as told by your doctor. Your doctor may tell you to use ice after flare-ups. This information is not intended to replace advice given to you by your health care provider. Make sure you discuss any questions you have with your health care provider. Document Revised: 09/23/2019 Document Reviewed: 09/23/2019 Elsevier Patient Education  2021 [place]. Tramadol tablets What is this medicine? TRAMADOL (TRA ma dole) is a pain reliever. It is used to treat severe pain. This medicine may be used for other purposes; ask your health care provider or pharmacist if you have questions. COMMON BRAND NAME(S): Ultram What should I tell my health care provider before I take this medicine? They need to know if you have any of these conditions:  brain tumor  drug abuse or addiction  head injury  if you often drink alcohol  kidney disease  liver disease  lung disease, asthma, or breathing problems  seizures  stomach or intestine problems  suicidal thoughts, plans or attempt; a previous suicide attempt by you or a family member  taken an MAOI like Marplan, Nardil, or Parnate in the last 14 days  an unusual allergic reaction to tramadol, other medicines, foods, dyes, or preservative  pregnant or trying to get pregnant  breast-feeding How should I use this medicine? Take this medicine by mouth with a full glass of water. Follow the directions on the prescription label. You can take it with or without food. If it upsets your stomach, take it with food. Do not take your medicine more often than directed. A special MedGuide will be given to you by the pharmacist with each prescription and  refill. Be sure to read this information carefully each time. Talk to your pediatrician regarding the use of this medicine in children. Special care may be needed. Overdosage: If you think you have taken too much of this medicine contact a poison control center or emergency room at once. NOTE: This medicine is only for you. Do not share this medicine with others. What if I miss a dose? If you miss a dose, take it as soon as you can. If it is almost time for your next dose, take only that dose. Do not take double or extra doses. What may interact with this medicine? Do not take this medicine with any of the following medications:  linezolid  MAOIs like Marplan, Nardil, and Parnate  methylene blue  ozanimod This medicine may also interact with the following  medications:  alcohol  antihistamines for allergy, cough, and cold  atropine  certain antibiotics like erythromycin, clarithromycin, rifampin  certain antivirals for HIV or hepatitis  certain medicines for anxiety or sleep  certain medicines for bladder problems like oxybutynin, tolterodine  certain medicines for depression like amitriptyline, bupropion, fluoxetine, paroxetine, sertraline  certain medicines for fungal infections like ketoconazole, itraconazole, or posaconazole  certain medicines for migraine headache like almotriptan, eletriptan, frovatriptan, naratriptan, rizatriptan, sumatriptan, zolmitriptan  certain medicines for Parkinson's disease like benztropine, trihexyphenidyl  certain medicines for seizures like carbamazepine, phenobarbital, primidone  certain medicines for stomach problems like dicyclomine, hyoscyamine  certain medicines for travel sickness like scopolamine  digoxin  diuretics  general anesthetics like halothane, isoflurane, methoxyflurane, propofol  ipratropium  medicines that relax muscles for surgery  other narcotic medicines for pain  phenothiazines like chlorpromazine,  mesoridazine, prochlorperazine, thioridazine  quinidine  warfarin This list may not describe all possible interactions. Give your health care provider a list of all the medicines, herbs, non-prescription drugs, or dietary supplements you use. Also tell them if you smoke, drink alcohol, or use illegal drugs. Some items may interact with your medicine. What should I watch for while using this medicine? Tell your health care provider if your pain does not go away, if it gets worse, or if you have new or a different type of pain. You may develop tolerance to this drug. Tolerance means that you will need a higher dose of the drug for pain relief. Tolerance is normal and is expected if you take this drug for a long time. Do not suddenly stop taking your drug because you may develop a severe reaction. Your body becomes used to the drug. This does NOT mean you are addicted. Addiction is a behavior related to getting and using a drug for a nonmedical reason. If you have pain, you have a medical reason to take pain drug. Your health care provider will tell you how much drug to take. If your health care provider wants you to stop the drug, the dose will be slowly lowered over time to avoid any side effects. If you take other drugs that also cause drowsiness like other narcotic pain drugs, benzodiazepines, or other drugs for sleep, you may have more side effects. Give your health care provider a list of all drugs you use. He or she will tell you how much drug to take. Do not take more drug than directed. Get emergency help right away if you have trouble breathing or are unusually tired or sleepy. Talk to your health care provider about naloxone and how to get it. Naloxone is an emergency drug used for an opioid overdose. An overdose can happen if you take too much opioid. It can also happen if an opioid is taken with some other drugs or substances, like alcohol. Know the symptoms of an overdose, like trouble  breathing, unusually tired or sleepy, or not being able to respond or wake up. Make sure to tell caregivers and close contacts where it is stored. Make sure they know how to use it. After naloxone is given, you must get emergency help right away. Naloxone is a temporary treatment. Repeat doses may be needed. This drug may cause serious skin reactions. They can happen weeks to months after starting the drug. Contact your health care provider right away if you notice fevers or flu-like symptoms with a rash. The rash may be red or purple and then turn into blisters or peeling of the skin.  Or, you might notice a red rash with swelling of the face, lips, or lymph nodes in your neck or under your arms. You may get drowsy or dizzy. Do not drive, use machinery, or do anything that needs mental alertness until you know how this drug affects you. Do not stand up or sit up quickly, especially if you are an older patient. This reduces the risk of dizzy or fainting spells. Alcohol may interfere with the effect of this drug. Avoid alcoholic drinks. This drug will cause constipation. If you do not have a bowel movement for 3 days, call your health care provider. Your mouth may get dry. Chewing sugarless gum or sucking hard candy and drinking plenty of water may help. Contact your health care provider if the problem does not go away or is severe. What side effects may I notice from receiving this medicine? Side effects that you should report to your doctor or health care professional as soon as possible:  allergic reactions (skin rash, itching or hives; swelling of the face, lips, or tongue)  confusion  low adrenal gland function (nausea; vomiting; loss of appetite; unusually weak or tired; dizziness; low blood pressure)  low blood pressure (dizziness; feeling faint or lightheaded, falls; unusually weak or tired)  low blood sugar (feeling anxious; confusion, dizziness, increased hunger; unusually weak or tired;  increased sweating; shakiness; cold, clammy skin; irritable; headache; blurred vision; fast heartbeat; loss of consciousness)  low sodium level (muscle weakness, fatigue, dizziness, headache, confusion)  redness, blistering, peeling or loosening of the skin, including inside the mouth  seizures  serotonin syndrome (irritable; confusion; diarrhea; fast or irregular heartbeat; muscle twitching; stiff muscles; trouble walking; sweating; high fever; seizures; chills; vomiting)  trouble breathing Side effects that usually do not require medical attention (report to your doctor or health care professional if they continue or are bothersome):  constipation  dry mouth  nausea, vomiting  tiredness This list may not describe all possible side effects. Call your doctor for medical advice about side effects. You may report side effects to FDA at 1-800-FDA-1088. Where should I keep my medicine? Keep out of the reach of children and pets. This medicine can be abused. Keep it in a safe place to protect it from theft. Do not share it with anyone. It is only for you. Selling or giving away this medicine is dangerous and against the law. Store at room temperature between 20 and 25 degrees C (68 and 77 degrees F). Get rid of any unused medicine after the expiration date. This medicine may cause harm and death if it is taken by other adults, children, or pets. It is important to get rid of the medicine as soon as you no longer need it or it is expired. You can do this in two ways:  Take the medicine to a medicine take-back program. Check with your pharmacy or law enforcement to find a location.  If you cannot return the medicine, check the label or package insert to see if the medicine should be thrown out in the garbage or flushed down the toilet. If you are not sure, ask your health care provider. If it is safe to put it in the trash, take the medicine out of the container. Mix the medicine with cat  litter, dirt, coffee grounds, or other unwanted substance. Seal the mixture in a bag or container. Put it in the trash. NOTE: This sheet is a summary. It may not cover all possible information. If you have  questions about this medicine, talk to your doctor, pharmacist, or health care provider.  2021 [employer]vier/[employer] (2020-06-28 13:35:28)

## 2020-10-18 NOTE — Progress Notes (Signed)
Established patient visit   Patient: Gerald Olson   DOB: 1966-02-28   55 y.o. Male  MRN: [id number] Visit Date: 10/18/2020  Today's healthcare provider: Marcille Buffy, FNP   Chief Complaint  Patient presents with  . 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 Changes made at last visit include patient was started on Prednisone and Tramadol.  He reports good compliance with treatment. He feels that condition is Unchanged. He is not having side effects.     Patient  denies any fever,chills, rash, chest pain, shortness of breath, nausea, vomiting, or diarrhea.  Denies dizziness, lightheadedness, pre syncopal or syncopal episodes.   -----------------------------------------------------------------------------------------      Medications: Outpatient Medications Prior to Visit  Medication Sig  . ALPHA-LIPOIC ACID PO Take by mouth.  . calcium-vitamin D (OSCAL WITH D) 500-200 MG-UNIT tablet Take 1 tablet by mouth.  . imatinib (GLEEVEC) 400 MG tablet TAKE 1 TABLET (400 MG TOTAL) BY MOUTH DAILY. TAKE WITH MEALS AND LARGE GLASS OF WATER.  Marland Kitchen lisinopril (ZESTRIL) 20 MG tablet Take 1 tablet (20 mg total) by mouth daily.  . metFORMIN (GLUCOPHAGE-XR) 500 MG 24 hr tablet Take 2 tablets (1,000 mg total) by mouth 2 (two) times daily with a meal.  . Multiple Vitamins-Minerals (MULTIVITAMIN WITH MINERALS) tablet Take 1 tablet by mouth daily.   . NONFORMULARY OR COMPOUNDED ITEM Apply 2 g topically 4 (four) times daily as needed (Diabetic neuropathy pain).  . Nutritional Supplements (DHEA PO) Take 100 mg by mouth daily.  Marland Kitchen OVER THE COUNTER MEDICATION Black seed oil 2000per day  . topiramate (TOPAMAX) 50 MG tablet Take 1 tablet (50 mg total) by mouth in the morning AND 2 tablets (100 mg total) at bedtime. Please call your specialist to schedule follow up due..  .  TRUEPLUS PEN NEEDLES 32G X 4 MM MISC USE TO INJECT VICTOZA ONCE DAILY.  Marland Kitchen UNABLE TO FIND Med Name: compound cream rx faxed to [facility] (amantadine 8%, baclofen 2%, gabapentin 6%, amitriptyline 4%, bupivacaine 2%, clonidine 0.2%, nifedipine 2%)  . zinc gluconate 50 MG tablet Take 50 mg by mouth daily.  . [DISCONTINUED] Accu-Chek FastClix Lancets MISC Use as instructed to check blood sugar up to 3 times daily. (Patient taking differently: 1 each by Other route 2 (two) times daily.)  . [DISCONTINUED] Blood Glucose Monitoring Suppl (ACCU-CHEK GUIDE ME) w/Device KIT 1 kit by Does not apply route 3 (three) times daily. (Patient taking differently: 1 kit by Does not apply route 2 (two) times daily.)  . [DISCONTINUED] glucose blood (ACCU-CHEK GUIDE) test strip Use as instructed to check blood sugar up to 3 times daily. (Patient taking differently: 1 each by Other route 2 (two) times daily.)  . [DISCONTINUED] insulin glargine (LANTUS SOLOSTAR) 100 UNIT/ML Solostar Pen Inject 20 Units into the skin every morning. And pen needles 1/day (Patient taking differently: Inject 22 Units into the skin every morning. And pen needles 1/day)  . [DISCONTINUED] predniSONE (STERAPRED UNI-PAK 21 TAB) 10 MG (21) TBPK tablet PO: Take 6 tablets on day 1:Take 5 tablets day 2:Take 4 tablets day 3: Take 3 tablets day 4:Take 2 tablets day five: 5 Take 1 tablet day 6  . [DISCONTINUED] traMADol (ULTRAM) 50 MG tablet Take 1 tablet (50 mg total) by mouth every 12 (twelve) hours as needed for severe pain.  No facility-administered medications prior to visit.    Review of Systems  Constitutional: Positive for fatigue.  HENT: Negative.   Respiratory: Negative.   Cardiovascular: Negative.   Genitourinary: Negative.   Musculoskeletal: Positive for arthralgias and back pain.    Last CBC Lab Results  Component Value Date   WBC 4.8 10/20/2020   HGB 12.9 (L) 10/20/2020   HCT 39.6 10/20/2020   MCV 90.8 10/20/2020    MCH 29.6 10/20/2020   RDW 14.9 10/20/2020   PLT 230 76/19/5093   Last metabolic panel Lab Results  Component Value Date   GLUCOSE 320 (H) 10/20/2020   NA 137 10/20/2020   K 4.3 10/20/2020   CL 109 10/20/2020   CO2 22 10/20/2020   BUN 16 10/20/2020   CREATININE 0.97 10/20/2020   GFRNONAA >60 10/20/2020   GFRAA 90 09/14/2020   CALCIUM 8.1 (L) 10/20/2020   PHOS 1.7 (L) 10/20/2020   PROT 6.4 (L) 10/20/2020   ALBUMIN 3.7 10/20/2020   LABGLOB 2.6 09/14/2020   AGRATIO 1.6 09/14/2020   BILITOT 0.6 10/20/2020   ALKPHOS 94 10/20/2020   AST 38 10/20/2020   ALT 31 10/20/2020   ANIONGAP 6 10/20/2020   Last lipids Lab Results  Component Value Date   CHOL 122 06/14/2020   HDL 34 (L) 06/14/2020   LDLCALC 72 06/14/2020   TRIG 77 06/14/2020   CHOLHDL 3.6 06/14/2020   Last hemoglobin A1c Lab Results  Component Value Date   HGBA1C 7.0 (A) 09/05/2020   Last thyroid functions Lab Results  Component Value Date   TSH 3.970 06/27/2020   T4TOTAL 5.2 06/27/2020   Last vitamin D Lab Results  Component Value Date   VD25OH 114.0 (H) 10/08/2019   Last vitamin B12 and Folate Lab Results  Component Value Date   VITAMINB12 540 06/27/2020       Objective    BP (!) 132/92   Pulse 97   Resp 16   Wt 234 lb 9.6 oz (106.4 kg)   SpO2 99%   BMI 28.56 kg/m  BP Readings from Last 3 Encounters:  10/18/20 (!) 132/92  09/14/20 (!) 173/99  09/08/20 133/83   Wt Readings from Last 3 Encounters:  10/18/20 234 lb 9.6 oz (106.4 kg)  09/14/20 235 lb 3.2 oz (106.7 kg)  09/08/20 236 lb (107 kg)       Physical Exam Vitals and nursing note reviewed.  Constitutional:      General: He is active. He is not in acute distress.    Appearance: Normal appearance. He is well-developed and well-nourished. He is not ill-appearing, toxic-appearing, sickly-appearing or diaphoretic.     Comments: Patient is alert and oriented and responsive to questions Engages in eye contact with provider. Speaks in  full sentences without any pauses without any shortness of breath or distress.    HENT:     Head: Normocephalic and atraumatic.     Right Ear: Hearing, tympanic membrane, ear canal and external ear normal.     Left Ear: Hearing, tympanic membrane, ear canal and external ear normal.     Nose: Nose normal.     Mouth/Throat:     Mouth: Oropharynx is clear and moist and mucous membranes are normal.     Pharynx: Uvula midline. No oropharyngeal exudate.  Eyes:     General: Lids are normal. No scleral icterus.       Right eye: No discharge.        Left eye: No discharge.  Extraocular Movements: EOM normal.     Conjunctiva/sclera: Conjunctivae normal.     Pupils: Pupils are equal, round, and reactive to light.  Neck:     Thyroid: No thyromegaly.     Vascular: Normal carotid pulses. No carotid bruit, hepatojugular reflux or JVD.     Trachea: Trachea and phonation normal. No tracheal tenderness or tracheal deviation.     Meningeal: Brudzinski's sign absent.  Cardiovascular:     Rate and Rhythm: Normal rate and regular rhythm.     Pulses: Normal pulses and intact distal pulses.     Heart sounds: Normal heart sounds, S1 normal and S2 normal. Heart sounds not distant. No murmur heard. No friction rub. No gallop.   Pulmonary:     Effort: Pulmonary effort is normal. No accessory muscle usage or respiratory distress.     Breath sounds: Normal breath sounds. No stridor. No wheezing or rales.  Chest:     Chest wall: No tenderness.  Abdominal:     General: Bowel sounds are normal. There is no distension. Aorta is normal.     Palpations: Abdomen is soft. There is no mass.     Tenderness: There is no abdominal tenderness. There is no guarding or rebound.     Hernia: No hernia is present.  Musculoskeletal:        General: No tenderness, deformity or edema. Normal range of motion.     Cervical back: Full passive range of motion without pain, normal range of motion and neck supple.     Comments:  Patient moves on and off of exam table and in room without difficulty. Gait is normal in hall and in room. Patient is oriented to person place time and situation. Patient answers questions appropriately and engages in conversation.   Lymphadenopathy:     Head:     Right side of head: No submental, submandibular, tonsillar, preauricular, posterior auricular or occipital adenopathy.     Left side of head: No submental, submandibular, tonsillar, preauricular, posterior auricular or occipital adenopathy.     Cervical: No cervical adenopathy.     Upper Body:  No axillary adenopathy present. Skin:    General: Skin is warm, dry and intact.     Capillary Refill: Capillary refill takes less than 2 seconds.     Coloration: Skin is not pale.     Findings: No erythema or rash.     Nails: There is no clubbing or cyanosis.  Neurological:     General: No focal deficit present.     Mental Status: He is alert and oriented to person, place, and time.     GCS: GCS eye subscore is 4. GCS verbal subscore is 5. GCS motor subscore is 6.     Cranial Nerves: No cranial nerve deficit.     Sensory: No sensory deficit.     Motor: No abnormal muscle tone.     Coordination: He displays a negative Romberg sign. Coordination normal.     Gait: Gait normal.     Deep Tendon Reflexes: Strength normal and reflexes are normal and symmetric. Reflexes normal.  Psychiatric:        Mood and Affect: Mood and affect and mood normal.        Speech: Speech normal.        Behavior: Behavior normal.        Thought Content: Thought content normal.        Cognition and Memory: Cognition and memory normal.  Judgment: Judgment normal.     No results found for any visits on 10/18/20.  Assessment & Plan     1. Low back pain, unspecified back pain laterality, unspecified chronicity, unspecified whether sciatica present   Pain management Dr. Cherylann Ratel should be calling you within next two weeks. ' Keep orthopedic appointment  this week, will defer pain management to orthopedics and pain medicine.  2. Type 2 diabetes mellitus with hyperglycemia  - Accu-Chek FastClix Lancets MISC; 1 each by Other route 2 (two) times daily.  Dispense: 180 each; Refill: 0 - glucose blood (ACCU-CHEK GUIDE) test strip; 1 each by Other route 2 (two) times daily.  Dispense: 180 strip; Refill: 0 - insulin glargine (LANTUS SOLOSTAR) 100 UNIT/ML Solostar Pen; Inject 22 Units into the skin every morning. And pen needles 1/day - Blood Glucose Monitoring Suppl (ACCU-CHEK GUIDE ME) w/Device KIT; 1 kit by Does not apply route 3 (three) times daily.  Dispense: 1 kit; Refill: 0  3. Uncontrolled type 2 diabetes mellitus with hyperglycemia (HCC)  - Accu-Chek FastClix Lancets MISC; 1 each by Other route 2 (two) times daily.  Dispense: 180 each; Refill: 0 - glucose blood (ACCU-CHEK GUIDE) test strip; 1 each by Other route 2 (two) times daily.  Dispense: 180 strip; Refill: 0 - insulin glargine (LANTUS SOLOSTAR) 100 UNIT/ML Solostar Pen; Inject 22 Units into the skin every morning. And pen needles 1/day - Blood Glucose Monitoring Suppl (ACCU-CHEK GUIDE ME) w/Device KIT; 1 kit by Does not apply route 3 (three) times daily.  Dispense: 1 kit; Refill: 0  Meds ordered this encounter  Medications  . Accu-Chek FastClix Lancets MISC    Sig: 1 each by Other route 2 (two) times daily.    Dispense:  180 each    Refill:  0  . glucose blood (ACCU-CHEK GUIDE) test strip    Sig: 1 each by Other route 2 (two) times daily.    Dispense:  180 strip    Refill:  0  . insulin glargine (LANTUS SOLOSTAR) 100 UNIT/ML Solostar Pen    Sig: Inject 22 Units into the skin every morning. And pen needles 1/day  . Blood Glucose Monitoring Suppl (ACCU-CHEK GUIDE ME) w/Device KIT    Sig: 1 kit by Does not apply route 3 (three) times daily.    Dispense:  1 kit    Refill:  0  . traMADol (ULTRAM) 50 MG tablet    Sig: Take 1-2 tablets (50-100 mg total) by mouth 3 (three) times daily  as needed for severe pain. May cause drowsiness.    Dispense:  60 tablet    Refill:  0   Return in 3 months (on 01/15/2021), or if symptoms worsen or fail to improve, for at any time for any worsening symptoms, Go to Emergency room/ urgent care if worse.     I spent  Over 60 minutes dedicated to the care of this patient on  the date of this encounter to include pre-visit review of records, face-to-face time with the  patient discussing The primary encounter diagnosis was Low back pain, unspecified back pain laterality, unspecified chronicity, unspecified whether sciatica present. Diagnoses of Type 2 diabetes mellitus with hyperglycemia and Uncontrolled type 2 diabetes mellitus with hyperglycemia (HCC) were also pertinent to this visit.  and post visit ordering of testing.   The entirety of the information documented in the History of Present Illness, Review of Systems and Physical Exam were personally obtained by me. Portions of this information were initially documented by  the CMA and reviewed by me for thoroughness and accuracy.     Marcille Buffy, [place] [phone] (phone) [phone] (fax)  [place]

## 2020-10-19 MED FILL — ACCU-CHEK FASTCLIX LANCETS: 34 days supply | Qty: 102 | Fill #0

## 2020-10-19 MED FILL — traMADol HCL 50 MG TABS: 50 | 5 days supply | Qty: 30 | Fill #0

## 2020-10-19 MED FILL — ACCU-CHEK GUIDE TEST STRIP: 34 days supply | Qty: 100 | Fill #0

## 2020-10-20 ENCOUNTER — Inpatient Hospital Stay: Payer: Medicaid Other | Attending: Oncology

## 2020-10-20 DIAGNOSIS — C9210 Chronic myeloid leukemia, BCR/ABL-positive, not having achieved remission: Secondary | ICD-10-CM | POA: Diagnosis not present

## 2020-10-20 DIAGNOSIS — D509 Iron deficiency anemia, unspecified: Secondary | ICD-10-CM | POA: Insufficient documentation

## 2020-10-20 DIAGNOSIS — Z79899 Other long term (current) drug therapy: Secondary | ICD-10-CM | POA: Insufficient documentation

## 2020-10-20 LAB — COMPREHENSIVE METABOLIC PANEL
ALT: 31 U/L (ref 0–44)
AST: 38 U/L (ref 15–41)
Albumin: 3.7 g/dL (ref 3.5–5.0)
Alkaline Phosphatase: 94 U/L (ref 38–126)
Anion gap: 6 (ref 5–15)
BUN: 16 mg/dL (ref 6–20)
CO2: 22 mmol/L (ref 22–32)
Calcium: 8.1 mg/dL — ABNORMAL LOW (ref 8.9–10.3)
Chloride: 109 mmol/L (ref 98–111)
Creatinine, Ser: 0.97 mg/dL (ref 0.61–1.24)
GFR, Estimated: >60 mL/min (ref >60)
Glucose, Bld: 320 mg/dL — ABNORMAL HIGH (ref 70–99)
Potassium: 4.3 mmol/L (ref 3.5–5.1)
Sodium: 137 mmol/L (ref 135–145)
Total Bilirubin: 0.6 mg/dL (ref 0.3–1.2)
Total Protein: 6.4 g/dL — ABNORMAL LOW (ref 6.5–8.1)

## 2020-10-20 LAB — CBC WITH DIFFERENTIAL/PLATELET
Abs Immature Granulocytes: 0.01 10*3/uL (ref 0.00–0.07)
Basophils Absolute: 0 10*3/uL (ref 0.0–0.1)
Basophils Relative: 0 %
Eosinophils Absolute: 0.1 10*3/uL (ref 0.0–0.5)
Eosinophils Relative: 2 %
HCT: 39.6 % (ref 39.0–52.0)
Hemoglobin: 12.9 g/dL — ABNORMAL LOW (ref 13.0–17.0)
Immature Granulocytes: 0 %
Lymphocytes Relative: 26 %
Lymphs Abs: 1.3 10*3/uL (ref 0.7–4.0)
MCH: 29.6 pg (ref 26.0–34.0)
MCHC: 32.6 g/dL (ref 30.0–36.0)
MCV: 90.8 fL (ref 80.0–100.0)
Monocytes Absolute: 0.5 10*3/uL (ref 0.1–1.0)
Monocytes Relative: 11 %
Neutro Abs: 2.9 10*3/uL (ref 1.7–7.7)
Neutrophils Relative %: 61 %
Platelets: 230 10*3/uL (ref 150–400)
RBC: 4.36 MIL/uL (ref 4.22–5.81)
RDW: 14.9 % (ref 11.5–15.5)
WBC: 4.8 10*3/uL (ref 4.0–10.5)
nRBC: 0 % (ref 0.0–0.2)

## 2020-10-20 LAB — PHOSPHORUS: Phosphorus: 1.7 mg/dL — ABNORMAL LOW (ref 2.5–4.6)

## 2020-10-20 LAB — MAGNESIUM: Magnesium: 2.2 mg/dL (ref 1.7–2.4)

## 2020-10-20 MED FILL — LANTUS SOLOSTAR 100 UNITS/M: 100 | 83 days supply | Qty: 15 | Fill #1

## 2020-10-20 MED FILL — IMATINIB MESYLATE 400 MG TA: 400 | 30 days supply | Qty: 30 | Fill #0

## 2020-10-21 DIAGNOSIS — M542 Cervicalgia: Secondary | ICD-10-CM | POA: Diagnosis not present

## 2020-10-21 DIAGNOSIS — G894 Chronic pain syndrome: Secondary | ICD-10-CM | POA: Diagnosis not present

## 2020-10-21 DIAGNOSIS — M5416 Radiculopathy, lumbar region: Secondary | ICD-10-CM | POA: Diagnosis not present

## 2020-10-21 DIAGNOSIS — M545 Low back pain, unspecified: Secondary | ICD-10-CM | POA: Diagnosis not present

## 2020-10-25 ENCOUNTER — Telehealth: Payer: Self-pay | Admitting: *Deleted

## 2020-10-25 NOTE — Telephone Encounter (Signed)
error 

## 2020-10-26 ENCOUNTER — Other Ambulatory Visit: Payer: Self-pay

## 2020-10-26 ENCOUNTER — Ambulatory Visit
Payer: Medicaid Other | Attending: Student in an Organized Health Care Education/Training Program | Admitting: Student in an Organized Health Care Education/Training Program

## 2020-10-26 ENCOUNTER — Encounter: Payer: Self-pay | Admitting: Student in an Organized Health Care Education/Training Program

## 2020-10-26 VITALS — BP 162/109 | HR 102 | Temp 97.5°F | Resp 18 | Ht 74.0 in | Wt 232.0 lb

## 2020-10-26 DIAGNOSIS — M5416 Radiculopathy, lumbar region: Secondary | ICD-10-CM

## 2020-10-26 DIAGNOSIS — M5136 Other intervertebral disc degeneration, lumbar region: Secondary | ICD-10-CM | POA: Diagnosis not present

## 2020-10-26 DIAGNOSIS — G894 Chronic pain syndrome: Secondary | ICD-10-CM | POA: Diagnosis present

## 2020-10-26 DIAGNOSIS — M47816 Spondylosis without myelopathy or radiculopathy, lumbar region: Secondary | ICD-10-CM | POA: Diagnosis not present

## 2020-10-26 DIAGNOSIS — G8929 Other chronic pain: Secondary | ICD-10-CM | POA: Diagnosis present

## 2020-10-26 DIAGNOSIS — M5412 Radiculopathy, cervical region: Secondary | ICD-10-CM

## 2020-10-26 DIAGNOSIS — M47812 Spondylosis without myelopathy or radiculopathy, cervical region: Secondary | ICD-10-CM | POA: Diagnosis present

## 2020-10-26 DIAGNOSIS — F1190 Opioid use, unspecified, uncomplicated: Secondary | ICD-10-CM

## 2020-10-26 DIAGNOSIS — M51369 Other intervertebral disc degeneration, lumbar region without mention of lumbar back pain or lower extremity pain: Secondary | ICD-10-CM

## 2020-10-26 NOTE — Progress Notes (Signed)
Patient: Gerald Olson  Service Category: E/M  Provider: Gillis Santa, MD  DOB: 15-Jan-1966  DOS: 10/26/2020  Referring Provider: Sharmon Leyden*  MRN: [id number]  Setting: Ambulatory outpatient  PCP: Doreen Beam, FNP  Type: New Patient  Specialty: Interventional Pain Management    Location: Office  Delivery: Face-to-face     Primary Reason(s) for Visit: Encounter for initial evaluation of one or more chronic problems (new to examiner) potentially causing chronic pain, and posing a threat to normal musculoskeletal function. (Level of risk: High) CC: Back Pain  HPI  Gerald Olson is a 55 y.o. year old, male patient, who comes for the first time to our practice referred by Doreen Beam, F* for our initial evaluation of his chronic pain. He has Type 2 diabetes mellitus with hyperglycemia; Alcohol use disorder, mild, abuse; Alcoholic cirrhosis of liver; Essential hypertension, benign; Low back pain; Cervical spondylosis; Diabetic polyneuropathy; ADHD (attention deficit hyperactivity disorder); Generalized anxiety disorder; Major depressive disorder; Foraminal stenosis of cervical region; Frequent falls; Spinal stenosis of cervical region; Recurrent major depressive disorder, in partial remission ([place]); Anxiety 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; Alcohol use disorder, moderate, dependence ([place]); Mesenteric lymphadenopathy; Splenomegaly- mild ; Screening for blood or protein in urine; Elevated blood pressure reading; History of cannabis abuse; Occult blood in stools; No-show for appointment; Fall; Acute midline thoracic back pain; 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 Today he comes in for evaluation of his Back Pain  Pain Assessment: Location: Lower,Right,Left Bladder Radiating: Left  side is worse. Pain radiates from lower back to above the knees bilaterally. Left side shooting pain is worse. Onset: More than a month ago Duration: Chronic pain Quality: Burning,Constant,Sharp,Shooting,Penetrating ("electric shock when moving") Severity: 9 /10 (subjective, self-reported pain score)  Effect on ADL: Moving too quicl causes the "electrick shock pain". Going off a step too fast causes pain. Turning hip to left causes a lot of pain and left leg "goes dead" Timing: Constant Modifying factors: streching, entending legs BP: (!) 162/109  HR: (!) 102  Onset and Duration: Started with accident Cause of pain: Work related accident or event Severity: Getting worse Timing: Not influenced by the time of the day Aggravating Factors: Bending, Bowel movements, Kneeling, Lifiting, Motion, Prolonged sitting, Prolonged standing, Squatting, Stooping , Twisting and Walking Alleviating Factors: Stretching, Lying down, Resting, Relaxation therapy and Chiropractic manipulations Associated Problems: Depression, Fatigue and Inability to concentrate Quality of Pain: Burning, Constant, Disabling, Distressing, Dreadful, Exhausting, Feeling of constriction, Nagging, Punishing, Sharp, Shooting, Throbbing and Uncomfortable Previous Examinations or Tests: MRI scan, Orthopedic evaluation and Chiropractic evaluation Previous Treatments: Chiropractic manipulations, Epidural steroid injections, Narcotic medications and Steroid treatments by mouth  Gerald Olson presents today with a chief complaint of diffuse whole body pain however his most severe pain location is his low back which radiates into his left greater than right thigh usually radiating down to his knees.  Describes a severe numbness and tingling and difficulty ambulating at times when he has severe pain.  He has tried physical therapy in the past.  He has tried gabapentin, Lyrica, Cymbalta without any significant analgesic benefit with such medications.  He  has tried tramadol in the past.  He takes OxyContin as needed when he has severe pain.  He receives this from an illicit source is not prescribed this by a medical professional.  Patient also endorses severe neck pain related to cervical radiculopathy and cervical spondylosis.  He has been told that he needs to have a cervical spinal fusion for his neck and arm pain.  He states that he will be seeing a surgeon in the future to further discuss cervical spine surgery however would like to get his low back addressed first.  Of note patient has a history of depression, anxiety, alcohol abuse disorder.  He also utilizes THC.  He is high risk for opioid prescribing I was very clear with the patient that would focus on nonopioid-based and interventional pain therapies.  He states that if I were to obtain a urine toxicology screen, it would be positive for various opioids.  For this reason, patient is high risk and a poor candidate for chronic opioid therapy.  Historic Controlled Substance Pharmacotherapy Review   The patient  reports current drug use. Drug: Marijuana. List of all UDS Test(s): Lab Results  Component Value Date   PCPQUANT Negative 09/14/2020   PCPQUANT Negative 08/09/2020   PCPQUANT Negative 05/23/2020   CANNABQUANT Negative 05/23/2020   List of other Serum/Urine Drug Screening Test(s):  Lab Results  Component Value Date   PCPQUANT Negative 09/14/2020   PCPQUANT Negative 08/09/2020   PCPQUANT Negative 05/23/2020   CANNABQUANT Negative 05/23/2020   Historical Background Evaluation: [place] PMP: PDMP not reviewed this encounter. Online review of the past 60-month period conducted.              [place] Department of public safety, offender search: Engineer, mining Information) Non-contributory Risk Assessment Profile: Aberrant behavior: obtaining controlled substances from inappropriate sources and obtaining medications from illicit sources Risk factors for fatal opioid overdose: age 69-64 years old,  history of alcoholism, history of non-compliance with medical advice and history of substance use disorder Fatal overdose hazard ratio (HR): Calculation deferred Non-fatal overdose hazard ratio (HR): Calculation deferred Risk of opioid abuse or dependence: 0.7-3.0% with doses ? 36 MME/day and 6.1-26% with doses ? 120 MME/day. Substance use disorder (SUD) risk level: High Personal History of Substance Abuse (SUD-Substance use disorder):  Alcohol: Positive Male or Male  Illegal Drugs: Negative  Rx Drugs: Negative  ORT Risk Level calculation: Moderate Risk  Opioid Risk Tool - 10/26/20 1315      Family History of Substance Abuse   Alcohol Positive Male    Illegal Drugs Negative    Rx Drugs Negative      Personal History of Substance Abuse   Alcohol Positive Male or Male    Illegal Drugs Negative    Rx Drugs Negative      Age   Age between 75-45 years  No      Psychological Disease   Psychological Disease Negative    Depression Positive      Total Score   Opioid Risk Tool Scoring 7    Opioid Risk Interpretation Moderate Risk          ORT Scoring interpretation table:  Score <3 = Low Risk for SUD  Score between 4-7 = Moderate Risk for SUD  Score >8 = High Risk for Opioid Abuse   PHQ-2 Depression Scale:  Total score: 1  PHQ-2 Scoring interpretation table: (Score and probability of major depressive disorder)  Score 0 = No depression  Score 1 = 15.4% Probability  Score 2 = 21.1% Probability  Score 3 = 38.4% Probability  Score 4 = 45.5% Probability  Score 5 = 56.4% Probability  Score 6 = 78.6% Probability   PHQ-9  Depression Scale:  Total score: 1  PHQ-9 Scoring interpretation table:  Score 0-4 = No depression  Score 5-9 = Mild depression  Score 10-14 = Moderate depression  Score 15-19 = Moderately severe depression  Score 20-27 = Severe depression (2.4 times higher risk of SUD and 2.89 times higher risk of overuse)   Pharmacologic Plan: Non-opioid analgesic  therapy offered.  Initial impression: Poor candidate for opioid analgesics.  Meds   Current Outpatient Medications:  .  Accu-Chek FastClix Lancets MISC, 1 each by Other route 2 (two) times daily., Disp: 180 each, Rfl: 0 .  ALPHA-LIPOIC ACID PO, Take by mouth., Disp: , Rfl:  .  Blood Glucose Monitoring Suppl (ACCU-CHEK GUIDE ME) w/Device KIT, 1 kit by Does not apply route 3 (three) times daily., Disp: 1 kit, Rfl: 0 .  calcium-vitamin D (OSCAL WITH D) 500-200 MG-UNIT tablet, Take 1 tablet by mouth., Disp: , Rfl:  .  glucose blood (ACCU-CHEK GUIDE) test strip, 1 each by Other route 2 (two) times daily., Disp: 180 strip, Rfl: 0 .  imatinib (GLEEVEC) 400 MG tablet, TAKE 1 TABLET (400 MG TOTAL) BY MOUTH DAILY. TAKE WITH MEALS AND LARGE GLASS OF WATER., Disp: 30 tablet, Rfl: 0 .  insulin glargine (LANTUS SOLOSTAR) 100 UNIT/ML Solostar Pen, Inject 22 Units into the skin every morning. And pen needles 1/day, Disp: , Rfl:  .  lisinopril (ZESTRIL) 20 MG tablet, Take 1 tablet (20 mg total) by mouth daily., Disp: 90 tablet, Rfl: 0 .  metFORMIN (GLUCOPHAGE-XR) 500 MG 24 hr tablet, Take 2 tablets (1,000 mg total) by mouth 2 (two) times daily with a meal., Disp: 120 tablet, Rfl: 2 .  Multiple Vitamins-Minerals (MULTIVITAMIN WITH MINERALS) tablet, Take 1 tablet by mouth daily. , Disp: , Rfl:  .  NONFORMULARY OR COMPOUNDED ITEM, Apply 2 g topically 4 (four) times daily as needed (Diabetic neuropathy pain)., Disp: 1 each, Rfl: 5 .  Nutritional Supplements (DHEA PO), Take 100 mg by mouth daily., Disp: , Rfl:  .  OVER THE COUNTER MEDICATION, Black seed oil 2000per day, Disp: , Rfl:  .  topiramate (TOPAMAX) 50 MG tablet, Take 1 tablet (50 mg total) by mouth in the morning AND 2 tablets (100 mg total) at bedtime. Please call your specialist to schedule follow up due.., Disp: 270 tablet, Rfl: 3 .  traMADol (ULTRAM) 50 MG tablet, Take 1-2 tablets (50-100 mg total) by mouth 3 (three) times daily as needed for severe pain.  May cause drowsiness., Disp: 60 tablet, Rfl: 0 .  TRUEPLUS PEN NEEDLES 32G X 4 MM MISC, USE TO INJECT VICTOZA ONCE DAILY., Disp: 100 each, Rfl: 11 .  zinc gluconate 50 MG tablet, Take 50 mg by mouth daily., Disp: , Rfl:   Imaging Review  Cervical Imaging: Cervical MR wo contrast: Results for orders placed during the hospital encounter of 05/29/20  MR Cervical Spine w/o contrast  Narrative CLINICAL DATA:  Spinal stenosis. Myelomalacia. Neck pain radiating down the left side.  EXAM: MRI CERVICAL SPINE WITHOUT CONTRAST  TECHNIQUE: Multiplanar, multisequence MR imaging of the cervical spine was performed. No intravenous contrast was administered.  COMPARISON:  11/21/2019  FINDINGS: Alignment: Straightening of the normal cervical lordosis. One or 2 mm of degenerative anterolisthesis at C7-T1.  Vertebrae: No fracture or primary bone lesion.  Cord: No primary cord lesion.  See below regarding stenosis.  Posterior Fossa, vertebral arteries, paraspinal tissues: Negative  Disc levels:  Foramen magnum is widely patent. No significant finding at the C1-2 level.  C2-3: Endplate osteophytes and bulging of the disc. No compressive central canal stenosis. Mild foraminal narrowing on the right.  C3-4: Endplate osteophytes and bulging of the disc. AP diameter of the canal in the midline 9.3 mm. Bilateral foraminal encroachment right worse than left. Either C4 nerve could be affected.  C4-5: Endplate osteophytes and bulging of the disc. AP diameter of the canal in the midline 7.6 mm. Slight triangulation of the cord without abnormal cord signal. Bilateral bony foraminal stenosis could affect either C5 nerve.  C5-6: Spondylosis with endplate osteophytes and protruding disc material more prominent towards the left. AP diameter of the canal in the midline 6.8 mm. Effacement of the subarachnoid space and some cord deformity particularly on the left. No conclusive abnormal T2 signal  within the cord. Bilateral foraminal stenosis could compress either or both C6 nerves.  C6-7: Endplate osteophytes and bulging of the disc. AP diameter of the canal in the midline 8.2 mm. No cord deformity. Bilateral foraminal stenosis left worse than right. Either C7 nerve could be affected, particularly the left.  C7-T1: Bilateral facet arthropathy with 1 or 2 mm of anterolisthesis. No disc pathology. No compressive canal stenosis. Bilateral foraminal stenosis could affect either C8 nerve. Edematous change of the facet joints could certainly relate to regional pain.  IMPRESSION: C2-3: Mild foraminal narrowing on the right.  C3-4: Spondylosis with bilateral foraminal narrowing right worse than left. Either C4 nerve could be affected.  C4-5: Spondylosis with bilateral foraminal narrowing. Either C5 nerve could be affected. Canal narrowing with AP diameter of 7.6 mm. Slight deformity of the cord without abnormal cord signal.  C5-6: Spondylosis with bilateral foraminal narrowing. Findings more pronounced on the left. Either C6 nerve could be affected. Canal narrowing worse on the left with deformity of the cord. I do not see conclusive T2 signal within the cord. AP diameter of the canal in the midline only 6.8 mm. The patient could be at risk of developing myelopathy at this level.  C6-7: Spondylosis with bilateral foraminal narrowing. Either C7 nerve could be affected. Somewhat worse on the left.  C7-T1: Facet arthropathy with 1 or 2 mm of anterolisthesis and edematous change. Bilateral foraminal stenosis could affect either C8 nerve. The facet arthropathy is likely painful.   Electronically Signed By: Nelson Chimes M.D. On: 05/31/2020 14:46  Narrative *RADIOLOGY REPORT*  Clinical Data: Left shoulder pain  LEFT SHOULDER - 2+ VIEW  Comparison: None.  Findings: No evidence for fracture.  No findings to suggest shoulder separation or dislocation. No worrisome lytic or  sclerotic osseous lesion.  IMPRESSION: No acute bony findings.  Original Report Authenticated By: Jashua A. MANSELL, M.D.    Lumbosacral Imaging: Lumbar MR wo contrast: Results for orders placed during the hospital encounter of 07/20/18  MR Lumbar Spine w/o contrast  Narrative CLINICAL DATA:  Lumbosacral spondylolisthesis.  EXAM: MRI LUMBAR SPINE WITHOUT CONTRAST  TECHNIQUE: Multiplanar, multisequence MR imaging of the lumbar spine was performed. No intravenous contrast was administered.  COMPARISON:  Radiography 07/03/2018  FINDINGS: Segmentation:  5 lumbar type vertebral bodies  Alignment:  Grade 1 anterolisthesis at L5-S1  Vertebrae:  No fracture, evidence of discitis, or bone lesion.  Conus medullaris and cauda equina: Conus extends to the T12-L1 level. Conus and cauda equina appear normal.  Paraspinal and other soft tissues: Negative  Disc levels:  T12- L1: Spondylosis and disc narrowing without impingement  L1-L2: Spondylosis and disc narrowing with small left paracentral protrusion.  L2-L3: Unremarkable.  L3-L4: Disc narrowing  and bulging.  No impingement  L4-L5: Degenerative facet spurring. The disc is bulging with annular fissure at the left foramen.  L5-S1:Severe facet degeneration with bulky hypertrophy on the right. There is anterolisthesis. Greatest level of degenerative disc narrowing with biforaminal height loss and bulge causing right foraminal impingement with L5 flattening. There is bilateral subarticular recess stenosis and S1 impingement.  IMPRESSION: 1. L5-S1 advanced facet arthropathy with anterolisthesis and right L5 and bilateral S1 impingement. 2. Noncompressive degenerative changes at the other levels as described above.   Electronically Signed By: Monte Fantasia M.D. On: 07/21/2018 09:06  LDG Lumbar Spine Complete  Narrative CLINICAL DATA:  Worsening low back pain after a recent fall.  Initial encounter.  EXAM: LUMBAR SPINE - COMPLETE 4+ VIEW  COMPARISON:  None.  FINDINGS: There are 5 non rib-bearing lumbar type vertebrae. There is grade 1 anterolisthesis of L5 on S1 which appears degenerative and facet mediated without definite pars defects identified. Vertebral body heights are preserved. No fracture is identified. Lumbar disc space heights are preserved aside from moderate narrowing at L5-S1. There is lower thoracic spondylosis with relatively bulky anterior vertebral spurring from T9-10 to T11-12. The regional soft tissues are unremarkable.  IMPRESSION: 1. No acute osseous abnormality identified. 2. L5-S1 disc and facet degeneration with grade 1 anterolisthesis.   Electronically Signed By: Logan Bores M.D.  Foot Imaging: Foot-R DG Complete: Results for orders placed during the hospital encounter of 06/30/11  DG Foot Complete Right  Narrative *RADIOLOGY REPORT*  Clinical Data: Right foot and ankle pain  RIGHT FOOT COMPLETE - 3+ VIEW  Comparison: None.  Findings: Normal alignment.  No subluxation, dislocation or acute fracture.  Very minor degenerative changes of the second MTP joint. No radiographic swelling or foreign body.  IMPRESSION: No acute osseous finding.  Original Report Authenticated By: Jerilynn Mages. Daryll Brod, M.D.  Narrative *RADIOLOGY REPORT*  Clinical Data: Wrist pain  LEFT WRIST - COMPLETE 3+ VIEW  Comparison: None.  Findings: Normal alignment without fracture.  Intact distal radius, ulna and carpal bones.  No radiographic swelling or foreign body. Preserved joint spaces.  IMPRESSION: No acute finding.  Original Report Authenticated By: Jerilynn Mages. Daryll Brod, M.D.   Complexity Note: Imaging results reviewed. Results shared with Mr. Biernat, using Layman's terms.                         ROS  Cardiovascular: No reported cardiovascular signs or symptoms such as High blood pressure, coronary artery disease, abnormal heart rate  or rhythm, heart attack, blood thinner therapy or heart weakness and/or failure Pulmonary or Respiratory: No reported pulmonary signs or symptoms such as wheezing and difficulty taking a deep full breath (Asthma), difficulty blowing air out (Emphysema), coughing up mucus (Bronchitis), persistent dry cough, or temporary stoppage of breathing during sleep Neurological: No reported neurological signs or symptoms such as seizures, abnormal skin sensations, urinary and/or fecal incontinence, being born with an abnormal open spine and/or a tethered spinal cord Psychological-Psychiatric: Depressed Gastrointestinal: Reflux or heatburn and Irregular, infrequent bowel movements (Constipation) Genitourinary: No reported renal or genitourinary signs or symptoms such as difficulty voiding or producing urine, peeing blood, non-functioning kidney, kidney stones, difficulty emptying the bladder, difficulty controlling the flow of urine, or chronic kidney disease Hematological: No reported hematological signs or symptoms such as prolonged bleeding, low or poor functioning platelets, bruising or bleeding easily, hereditary bleeding problems, low energy levels due to low hemoglobin or being anemic Endocrine: High blood sugar  requiring insulin (IDDM) Rheumatologic: Rheumatoid arthritis Musculoskeletal: Negative for myasthenia gravis, muscular dystrophy, multiple sclerosis or malignant hyperthermia Work History: Disabled  Allergies  Mr. Nordling is allergic to hydrocodone and oxycontin [oxycodone hcl].  Laboratory Chemistry Profile   Renal Lab Results  Component Value Date   BUN 16 10/20/2020   CREATININE 0.97 10/20/2020   LABCREA 219.8 05/23/2020   BCR 8 (L) 09/14/2020   GFRAA 90 09/14/2020   GFRNONAA >60 10/20/2020   SPECGRAV >=1.030 (A) 09/15/2020   PHUR 5.0 09/15/2020   PROTEINUR Positive (A) 09/15/2020     Electrolytes Lab Results  Component Value Date   NA 137 10/20/2020   K 4.3 10/20/2020   CL 109  10/20/2020   CALCIUM 8.1 (L) 10/20/2020   MG 2.2 10/20/2020   PHOS 1.7 (L) 10/20/2020     Hepatic Lab Results  Component Value Date   AST 38 10/20/2020   ALT 31 10/20/2020   ALBUMIN 3.7 10/20/2020   ALKPHOS 94 10/20/2020   LIPASE 113 (H) 10/01/2019     ID Lab Results  Component Value Date   HIV Non Reactive 05/23/2020   SARSCOV2NAA Not Detected 01/25/2019     Bone Lab Results  Component Value Date   VD25OH 114.0 (H) 10/08/2019     Endocrine Lab Results  Component Value Date   GLUCOSE 320 (H) 10/20/2020   GLUCOSEU Negative 03/04/2020   HGBA1C 7.0 (A) 09/05/2020   TSH 3.970 06/27/2020     Neuropathy Lab Results  Component Value Date   VITAMINB12 540 06/27/2020   HGBA1C 7.0 (A) 09/05/2020   HIV Non Reactive 05/23/2020     CNS No results found for: COLORCSF, APPEARCSF, RBCCOUNTCSF, WBCCSF, POLYSCSF, LYMPHSCSF, EOSCSF, PROTEINCSF, GLUCCSF, JCVIRUS, CSFOLI, IGGCSF, LABACHR, ACETBL, LABACHR, ACETBL   Inflammation (CRP: Acute  ESR: Chronic) Lab Results  Component Value Date   ESRSEDRATE 14 10/08/2019     Rheumatology Lab Results  Component Value Date   RF <10.0 10/08/2019   ANA Negative 10/08/2019     Coagulation Lab Results  Component Value Date   PLT 230 10/20/2020     Cardiovascular Lab Results  Component Value Date   HGB 12.9 (L) 10/20/2020   HCT 39.6 10/20/2020     Screening Lab Results  Component Value Date   SARSCOV2NAA Not Detected 01/25/2019   HIV Non Reactive 05/23/2020     Cancer No results found for: CEA, CA125, LABCA2   Allergens No results found for: ALMOND, APPLE, ASPARAGUS, AVOCADO, BANANA, BARLEY, BASIL, BAYLEAF, GREENBEAN, LIMABEAN, WHITEBEAN, BEEFIGE, REDBEET, BLUEBERRY, BROCCOLI, CABBAGE, MELON, CARROT, CASEIN, CASHEWNUT, CAULIFLOWER, CELERY     Note: Lab results reviewed.  PFSH  Drug: Mr. Coye  reports current drug use. Drug: Marijuana. Alcohol:  reports current alcohol use of about 14.0 - 21.0 standard drinks of  alcohol per week. Tobacco:  reports that he quit smoking about 17 years ago. He has never used smokeless tobacco. Medical:  has a past medical history of ADHD (attention deficit hyperactivity disorder), Alcohol use disorder, Alcoholic cirrhosis of liver, Allergy, Calculus of gallbladder without cholecystitis without obstruction, Chronic pain, Degenerative disc disease, lumbar, Diabetic polyneuropathy, Essential hypertension, benign, Generalized anxiety disorder, Glaucoma, Headache, History of concussion (2006), Major depressive disorder, Other spondylosis with radiculopathy, cervical region, Retinopathy, and Type 2 diabetes mellitus with hyperglycemia. Family: family history includes Aneurysm in his father; Emphysema in his maternal grandfather; Multiple sclerosis in his sister.  Past Surgical History:  Procedure Laterality Date  . CHOLECYSTECTOMY N/A 06/02/2018   Procedure: LAPAROSCOPIC  CHOLECYSTECTOMY;  Surgeon: Vickie Epley, MD;  Location: [facility] ORS;  Service: General;  Laterality: N/A;  . KNEE ARTHROSCOPY     30 Years ago   Active Ambulatory Problems    Diagnosis Date Noted  . Type 2 diabetes mellitus with hyperglycemia 04/25/2018  . Alcohol use disorder, mild, abuse 04/25/2018  . Alcoholic cirrhosis of liver 06/02/2018  . Essential hypertension, benign 01/28/2019  . Low back pain 07/28/2019  . Cervical spondylosis 09/22/2019  . Diabetic polyneuropathy   . ADHD (attention deficit hyperactivity disorder)   . Generalized anxiety disorder   . Major depressive disorder   . Foraminal stenosis of cervical region 05/09/2020  . Frequent falls 05/23/2020  . Spinal stenosis of cervical region 06/03/2020  . Recurrent major depressive disorder, in partial remission ([place]) 06/09/2020  . Anxiety 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 06/09/2020  . Alcohol use disorder, moderate, dependence ([place]) 06/09/2020  . Mesenteric lymphadenopathy 06/14/2020  . Splenomegaly- mild  06/14/2020  . Screening for  blood or protein in urine 06/14/2020  . Elevated blood pressure reading 06/14/2020  . History of cannabis abuse 06/14/2020  . Occult blood in stools 06/15/2020  . No-show for appointment 08/04/2020  . Fall 08/09/2020  . Acute midline thoracic back pain 08/09/2020  . 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 Urinary symptom or sign 09/14/2020  . Cervical radicular pain 10/26/2020  . Lumbar facet arthropathy 10/26/2020  . Lumbar degenerative disc disease 10/26/2020  . Chronic pain syndrome 10/26/2020  . Opiate misuse 10/26/2020   Resolved Ambulatory Problems    Diagnosis Date Noted  . Calculus of gallbladder without cholecystitis without obstruction    Past Medical History:  Diagnosis Date  . Allergy   . Chronic pain   . Degenerative disc disease, lumbar   . Glaucoma   . Headache   . History of concussion 2006  . Other spondylosis with radiculopathy, cervical region   . Retinopathy    Constitutional Exam  General appearance: Well nourished, well developed, and well hydrated. In no apparent acute distress Vitals:   10/26/20 1302  BP: (!) 162/109  Pulse: (!) 102  Resp: 18  Temp: (!) 97.5 F (36.4 C)  SpO2: 100%  Weight: 232 lb (105.2 kg)  Height: $Remove'6\' 2"'ZqDtkln$  (1.88 m)   BMI Assessment: Estimated body mass index is 29.79 kg/m as calculated from the following:   Height as of this encounter: $RemoveBeforeD'6\' 2"'vBKehkPNuuCUrW$  (1.88 m).   Weight as of this encounter: 232 lb (105.2 kg).  BMI interpretation table: BMI level Category Range association with higher incidence of chronic pain  <18 kg/m2 Underweight   18.5-24.9 kg/m2 Ideal body weight   25-29.9 kg/m2 Overweight Increased incidence by 20%  30-34.9 kg/m2 Obese (Class I) Increased incidence by 68%  35-39.9 kg/m2 Severe obesity (Class II) Increased incidence by 136%  >40 kg/m2 Extreme obesity (Class III) Increased incidence by 254%   Patient's current BMI Ideal Body weight  Body mass index is 29.79  kg/m. Ideal body weight: 82.2 kg (181 lb 3.5 oz) Adjusted ideal body weight: 91.4 kg (201 lb 8.5 oz)   BMI Readings from Last 4 Encounters:  10/26/20 29.79 kg/m  10/18/20 28.56 kg/m  09/14/20 28.63 kg/m  09/08/20 28.73 kg/m   Wt Readings from Last 4 Encounters:  10/26/20 232 lb (105.2 kg)  10/18/20 234 lb 9.6 oz (106.4 kg)  09/14/20 235 lb 3.2 oz (106.7 kg)  09/08/20 236 lb (107 kg)    Psych/Mental status:  Alert, oriented x 3 (person, place, & time)       Eyes: PERLA Respiratory: No evidence of acute respiratory distress  Cervical Spine Exam  Skin & Axial Inspection: No masses, redness, edema, swelling, or associated skin lesions Alignment: Symmetrical Functional ROM: Pain restricted ROM      Stability: No instability detected Muscle Tone/Strength: Functionally intact. No obvious neuro-muscular anomalies detected. Sensory (Neurological): Dermatomal pain pattern Palpation: No palpable anomalies             Positive Spurling's on the right Upper Extremity (UE) Exam    Side: Right upper extremity  Side: Left upper extremity  Skin & Extremity Inspection: Skin color, temperature, and hair growth are WNL. No peripheral edema or cyanosis. No masses, redness, swelling, asymmetry, or associated skin lesions. No contractures.  Skin & Extremity Inspection: Skin color, temperature, and hair growth are WNL. No peripheral edema or cyanosis. No masses, redness, swelling, asymmetry, or associated skin lesions. No contractures.  Functional ROM: Pain restricted ROM for all joints of upper extremity  Functional ROM: Unrestricted ROM          Muscle Tone/Strength: Functionally intact. No obvious neuro-muscular anomalies detected.   Muscle Tone/Strength: Functionally intact. No obvious neuro-muscular anomalies detected.  Sensory (Neurological): Dermatomal pain pattern affecting the shoulder and elbow  Sensory (Neurological): Unimpaired          Palpation: No palpable anomalies               Palpation: No palpable anomalies              Provocative Test(s):  Phalen's test: deferred Tinel's test: deferred Apley's scratch test (touch opposite shoulder):  Action 1 (Across chest): Decreased ROM Action 2 (Overhead): Decreased ROM Action 3 (LB reach): Decreased ROM   Provocative Test(s):  Phalen's test: deferred Tinel's test: deferred Apley's scratch test (touch opposite shoulder):  Action 1 (Across chest): deferred Action 2 (Overhead): deferred Action 3 (LB reach): deferred    Thoracic Spine Area Exam  Skin & Axial Inspection: No masses, redness, or swelling Alignment: Symmetrical Functional ROM: Unrestricted ROM Stability: No instability detected Muscle Tone/Strength: Functionally intact. No obvious neuro-muscular anomalies detected. Sensory (Neurological): Musculoskeletal pain pattern  Lumbar Exam  Skin & Axial Inspection: No masses, redness, or swelling Alignment: Symmetrical Functional ROM: Pain restricted ROM       Stability: No instability detected Muscle Tone/Strength: Functionally intact. No obvious neuro-muscular anomalies detected. Sensory (Neurological): Dermatomal pain pattern Palpation: No palpable anomalies       Provocative Tests: Hyperextension/rotation test: (+) bilaterally for facet joint pain. Lumbar quadrant test (Kemp's test): (+) bilateral for foraminal stenosis Lateral bending test: (+) ipsilateral radicular pain, bilaterally. Positive for bilateral foraminal stenosis.   Gait & Posture Assessment  Ambulation: Limited Gait: Age-related, senile gait pattern Posture: Difficulty standing up straight, due to pain   Lower Extremity Exam    Side: Right lower extremity  Side: Left lower extremity  Stability: No instability observed          Stability: No instability observed          Skin & Extremity Inspection: Skin color, temperature, and hair growth are WNL. No peripheral edema or cyanosis. No masses, redness, swelling, asymmetry, or associated  skin lesions. No contractures.  Skin & Extremity Inspection: Skin color, temperature, and hair growth are WNL. No peripheral edema or cyanosis. No masses, redness, swelling, asymmetry, or associated skin lesions. No contractures.  Functional ROM: Pain restricted ROM for hip and knee joints  Functional ROM: Pain restricted ROM for hip and knee joints          Muscle Tone/Strength: Movement possible against some resistance (4/5)  Muscle Tone/Strength: Movement possible against some resistance (4/5)  Sensory (Neurological): Dermatomal pain pattern        Sensory (Neurological): Dermatomal pain pattern        DTR: Patellar: deferred today Achilles: deferred today Plantar: deferred today  DTR: Patellar: deferred today Achilles: deferred today Plantar: deferred today  Palpation: No palpable anomalies  Palpation: No palpable anomalies     Assessment  Primary Diagnosis & Pertinent Problem List: The primary encounter diagnosis was Chronic radicular lumbar pain. Diagnoses of Lumbar facet arthropathy, Opiate misuse, Lumbar degenerative disc disease, Cervical radicular pain, Cervical spondylosis, Chronic pain syndrome, and Other intervertebral disc degeneration, lumbar region were also pertinent to this visit.  Visit Diagnosis (New problems to examiner): 1. Chronic radicular lumbar pain   2. Lumbar facet arthropathy   3. Opiate misuse   4. Lumbar degenerative disc disease   5. Cervical radicular pain   6. Cervical spondylosis   7. Chronic pain syndrome   8. Other intervertebral disc degeneration, lumbar region     Plan of Care (Initial workup plan)  Note: High risk for opioid prescribing.  We will focus on nonopioid analgesics, behavioral therapy, physical therapy, interventional treatments.   Imaging Orders     MR LUMBAR SPINE WO CONTRAST  Procedure Orders     Lumbar Epidural Injection Pharmacotherapy (current):  Pharmacological management options:  Opioid Analgesics: Not a  candidate for COT: Chronic opioid therapy for reasons listed above  Membrane stabilizer: pain tried and failed Lyrica, Cymbalta, gabapentin.  Muscle relaxant: Tried and failed  NSAID: To be determined at a later time  Other analgesic(s): To be determined at a later time   Interventional management options: Mr. Jandreau was informed that there is no guarantee that he would be a candidate for interventional therapies. The decision will be based on the results of diagnostic studies, as well as Mr. Po risk profile.  Procedure(s) under consideration:  Lumbar epidural steroid injection Diagnostic lumbar facet medial branch nerve blocks Sacroiliac joint injection   Provider-requested follow-up: Return in about 2 weeks (around 11/09/2020) for L-ESI, without sedation.  Future Appointments  Date Time Provider [place]  11/07/2020  1:00 PM Nevada Crane, MD [facility] None  12/01/2020  2:00 PM Bernardo Heater, Ronda Fairly, MD [facility] None  12/07/2020  2:45 PM [facility] None  12/15/2020  2:30 PM Lloyd Huger, MD [facility] None  12/15/2020  3:00 PM [name], RPH-CPP [place] None  01/02/2021 12:45 PM Hayden Pedro, MD [facility] None  03/01/2021  2:45 PM Frann Rider, NP [facility] None    Note by: Gillis Santa, MD Date: 10/26/2020; Time: 2:46 PM

## 2020-10-26 NOTE — Progress Notes (Signed)
Safety precautions to be maintained throughout the outpatient stay will include: orient to surroundings, keep bed in low position, maintain call bell within reach at all times, provide assistance with transfer out of bed and ambulation.  

## 2020-10-27 ENCOUNTER — Other Ambulatory Visit: Payer: Self-pay | Admitting: Urology

## 2020-10-30 LAB — BCR-ABL1, CML/ALL, PCR, QUANT: Interpretation (BCRAL):: NEGATIVE

## 2020-11-07 ENCOUNTER — Encounter (HOSPITAL_COMMUNITY): Payer: Self-pay | Admitting: Psychiatry

## 2020-11-07 ENCOUNTER — Other Ambulatory Visit: Payer: Self-pay

## 2020-11-07 ENCOUNTER — Telehealth (INDEPENDENT_AMBULATORY_CARE_PROVIDER_SITE_OTHER): Payer: Medicaid Other | Admitting: Psychiatry

## 2020-11-07 DIAGNOSIS — F1020 Alcohol dependence, uncomplicated: Secondary | ICD-10-CM

## 2020-11-07 DIAGNOSIS — F064 Anxiety disorder due to known physiological condition: Secondary | ICD-10-CM

## 2020-11-07 DIAGNOSIS — F0634 Mood disorder due to known physiological condition with mixed features: Secondary | ICD-10-CM | POA: Diagnosis not present

## 2020-11-07 NOTE — Progress Notes (Signed)
[facility] Progress Note   Virtual Visit via Telephone Note  I connected with Gerald Olson on 11/07/20 at  1:00 PM EDT by telephone and verified that I am speaking with the correct person using two identifiers.  Location: Patient: home Provider: Clinic   I discussed the limitations, risks, security and privacy concerns of performing an evaluation and management service by telephone and the availability of in person appointments. I also discussed with the patient that there may be a patient responsible charge related to this service. The patient expressed understanding and agreed to proceed.   I provided 26 minutes of non-face-to-face time during this encounter.    Patient Identification: RAS KOLLMAN MRN:  [id number] Date of Evaluation:  11/07/2020    Chief Complaint:  " I am in a lot of pain and this computer is not working so I can't see you."    Visit Diagnosis:    ICD-10-CM   1. 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 Anxiety 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  F06.4   3. Alcohol use disorder, moderate, dependence (HCC)  F10.20     History of Present Illness: This is a 55 year old male with history of depression, anxiety, alcohol use disorder, cervical and lumbar disc disease, diabetes mellitus type 2, hypertension, alcoholic cirrhosis, concussion, retinopathy, chronic pain now seen for follow up.  Patient tried to make the camera on his phone as well as on his computer work however he was unsuccessful. Patient stated that he still in a lot of pain.  He recently saw pain management provider Dr. Zollie Scale and they discussed the possibility of him getting steroid injections.  He is hoping that he can get some relief with that.  He is supposed to be undergoing the MRI of his spine again. He stated that it took him a long time to get in contact with an orthopedic specialist because of the long waiting list.  He finally is supposed to be seeing  someone in the next few weeks.  He is hoping to get more relief of his pain. 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  He stated that he had to miss couple of his sessions because of his severe pain in the back.  He stated that on one incident he had helped his mother lift some weight around her house and that resulted in another bad flareup.  He continues to suffer from poor quality of life due to the chronic pain in his back and neck.  He is really hoping that he can get some surgical intervention done to help with the same.   He stated that he hopes to get some relief in the next few months and wants his pain is under control he really wants to pursue a PhD in psychology.  He stated that he tried a higher dose of Cymbalta but that did not help any and he does not think he needs any of those medications at this point.  Regarding his alcohol consumption, patient stated that he has cut down significantly.  He stated that means more pain to deal with.  He also stated that he does not have much of an appetite lately and has lost some weight because of that.  However he knows that if he is prescribed steroids to help with back pain he may gain some of his weight back.  He has been trying to practice  CBT on himself but is unable to due to his pain.  Writer advised him to continue to work on his medical conditions including management of his pain.  He was agreeable to touch base in 3 months.   Past Psychiatric History: MDD, alcohol abuse, anxiety  Previous Psychotropic Medications: Yes   Substance Abuse History in the last 12 months:  Yes.    Consequences of Substance Abuse: Medical Consequences:  Health conditions Legal Consequences:  Hx of DWI Withdrawal Symptoms:   Cramps Diaphoresis Diarrhea Headaches Nausea Tremors  Past Medical History:  Past Medical History:  Diagnosis Date  . ADHD (attention deficit hyperactivity  disorder)    Diagnosed during teenage years  . Alcohol use disorder    On average 14-21 drinks per week  . Alcoholic cirrhosis of liver    Per patient - condition is not related to moderate alcohol use but rather prolonged NSAID use and recent abuse.  . Allergy   . Calculus of gallbladder without cholecystitis without obstruction   . Chronic pain    Neck/lower back  . Degenerative disc disease, lumbar   . Diabetic polyneuropathy   . Essential hypertension, benign   . Generalized anxiety disorder   . Glaucoma    Per patient report  . Headache   . 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 Major depressive disorder   . Other spondylosis with radiculopathy, cervical region   . Retinopathy    Right eye; per patient report  . Type 2 diabetes mellitus with hyperglycemia     Past Surgical History:  Procedure Laterality Date  . CHOLECYSTECTOMY N/A 06/02/2018   Procedure: LAPAROSCOPIC CHOLECYSTECTOMY;  Surgeon: Vickie Epley, MD;  Location: [facility] ORS;  Service: General;  Laterality: N/A;  . KNEE ARTHROSCOPY     30 Years ago    Family Psychiatric History: Sister- bipolar disorder  Family History:  Family History  Problem Relation Age of Onset  . Aneurysm Father   . Multiple sclerosis Sister   . Emphysema Maternal Grandfather   . Diabetes Neg Hx     Social History:   Social History   Socioeconomic History  . Marital status: Single    Spouse name: Not on file  . Number of children: Not on file  . Years of education: 16  . Highest education level: Master's degree (e.g., MA, MS, MEng, MEd, MSW, MBA)  Occupational History  . Not on file  Tobacco Use  . Smoking status: Former Smoker    Quit date: 06/25/2003    Years since quitting: 17.3  . Smokeless tobacco: Never Used  . Tobacco comment: Currently vapes  Vaping Use  . Vaping Use: Every day  Substance and Sexual Activity  . Alcohol use: Yes    Alcohol/week: 14.0 - 21.0  standard drinks    Types: 14 - 21 Cans of beer per week    Comment: 2-3 beers per night on average; sometimes up to 6  . Drug use: Yes    Types: Marijuana    Comment: 1/week to help sleep; edibles, does not smoke  . Sexual activity: Yes  Other Topics Concern  . Not on file  Social History Narrative  . Not on file   Social Determinants of Health   Financial Resource Strain: Not on file  Food Insecurity: Not on file  Transportation Needs: Not on file  Physical Activity: Not on file  Stress: Not on file  Social Connections: Not  on file    Additional Social History:   Allergies:   Allergies  Allergen Reactions  . Hydrocodone Itching    Itching, face tingling, dizziness.  [name] [Oxycodone Hcl] Itching    Metabolic Disorder Labs: Lab Results  Component Value Date   HGBA1C 7.0 (A) 09/05/2020   MPG 192 01/12/2019   MPG 217.34 06/02/2018   No results found for: PROLACTIN Lab Results  Component Value Date   CHOL 122 06/14/2020   TRIG 77 06/14/2020   HDL 34 (L) 06/14/2020   CHOLHDL 3.6 06/14/2020   LDLCALC 72 06/14/2020   [place] 76 07/01/2019   Lab Results  Component Value Date   TSH 3.970 06/27/2020    Therapeutic Level Labs: No results found for: LITHIUM No results found for: CBMZ No results found for: VALPROATE  Current Medications: Current Outpatient Medications  Medication Sig Dispense Refill  . Accu-Chek FastClix Lancets MISC 1 each by Other route 2 (two) times daily. 180 each 0  . ALPHA-LIPOIC ACID PO Take by mouth.    . Blood Glucose Monitoring Suppl (ACCU-CHEK GUIDE ME) w/Device KIT 1 kit by Does not apply route 3 (three) times daily. 1 kit 0  . calcium-vitamin D (OSCAL WITH D) 500-200 MG-UNIT tablet Take 1 tablet by mouth.    Marland Kitchen glucose blood (ACCU-CHEK GUIDE) test strip 1 each by Other route 2 (two) times daily. 180 strip 0  . imatinib (GLEEVEC) 400 MG tablet TAKE 1 TABLET (400 MG TOTAL) BY MOUTH DAILY. TAKE WITH MEALS AND LARGE GLASS OF WATER.  30 tablet 0  . insulin glargine (LANTUS SOLOSTAR) 100 UNIT/ML Solostar Pen Inject 22 Units into the skin every morning. And pen needles 1/day    . lisinopril (ZESTRIL) 20 MG tablet Take 1 tablet (20 mg total) by mouth daily. 90 tablet 0  . metFORMIN (GLUCOPHAGE-XR) 500 MG 24 hr tablet Take 2 tablets (1,000 mg total) by mouth 2 (two) times daily with a meal. 120 tablet 2  . Multiple Vitamins-Minerals (MULTIVITAMIN WITH MINERALS) tablet Take 1 tablet by mouth daily.     . NONFORMULARY OR COMPOUNDED ITEM Apply 2 g topically 4 (four) times daily as needed (Diabetic neuropathy pain). 1 each 5  . Nutritional Supplements (DHEA PO) Take 100 mg by mouth daily.    Marland Kitchen OVER THE COUNTER MEDICATION Black seed oil 2000per day    . topiramate (TOPAMAX) 50 MG tablet Take 1 tablet (50 mg total) by mouth in the morning AND 2 tablets (100 mg total) at bedtime. Please call your specialist to schedule follow up due.. 270 tablet 3  . traMADol (ULTRAM) 50 MG tablet Take 1-2 tablets (50-100 mg total) by mouth 3 (three) times daily as needed for severe pain. May cause drowsiness. 60 tablet 0  . TRUEPLUS PEN NEEDLES 32G X 4 MM MISC USE TO INJECT VICTOZA ONCE DAILY. 100 each 11  . zinc gluconate 50 MG tablet Take 50 mg by mouth daily.     No current facility-administered medications for this visit.    Psychiatric Specialty Exam: Review of Systems  There were no vitals taken for this visit.There is no height or weight on file to calculate BMI.  General Appearance: unable to assess due to phone visit  Eye Contact:  unable to assess due to phone visit  Speech:  Clear and Coherent and Normal Rate, Talkative +++  Volume:  Normal  Mood:  Expansive mood  Affect:  Congruent  Thought Process:  Goal Directed and Descriptions of Associations: Circumstantial  Orientation:  Full (Time, Place, and Person)  Thought Content:  Logical  Suicidal Thoughts:  No  Homicidal Thoughts:  No  Memory:  Immediate;   Good Recent;   Good   Judgement:  Fair  Insight:  Fair  Psychomotor Activity:  Normal  Concentration:  Concentration: Fair and Attention Span: Fair  Recall:  Good  Fund of Knowledge:Good  Language: Good  Akathisia:  Negative  Handed:  Right  AIMS (if indicated):  not done  Assets:  Communication Skills Desire for Improvement Financial Resources/Insurance Housing Social Support  ADL's:  Intact  Cognition: WNL  Sleep:  Poor due to chronic pain   Screenings: GAD-7   Flowsheet Row Office Visit from 05/23/2020 in [place] Office Visit from 07/01/2019 in [place] Office Visit from 11/28/2018 in [place] Visit from 10/06/2018 in [facility] Visit from 08/06/2018 in [place]  Total GAD-7 Score 13 0 _0 PHQ2-9   Flowsheet Row Video Visit from 11/07/2020 in [facility] Office Visit from 10/26/2020 in [facility] Office Visit from 09/14/2020 in [facility] [place] Visit from 06/14/2020 in [place] Visit from 05/23/2020 in [place]  PHQ-2 Total Score 0 _1 PHQ-9 Total Score 7 -- _2 Flowsheet Row Video Visit from 11/07/2020 in [place] from 04/14/2018 in [facility] [place] No Risk Low Risk      Assessment and Plan: Patient continues to be talkative and overinclusive in his conversation, which appears to be his baseline.  Patient continues to deal with chronic pain which has been impacting his quality of life.  He is also undergoing oncology follow-up for chronic myeloid leukemia and is currently taking medication for the same.  He recently saw pain management and is hopeful to get some  relief with steroid injections.  He is also supposed to go 1 more MRI of the spine. He is supposed to be seeing a different orthopedic provider for his back pain.   1. 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 Anxiety 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   3. Alcohol use disorder, moderate, dependence ([place]) -Patient was encouraged to avoid excessive consumption of alcohol.   F/up in 3 months.    [place] Crane, MD 3/14/20221:44 PM

## 2020-11-10 ENCOUNTER — Telehealth: Payer: Self-pay | Admitting: Adult Health

## 2020-11-10 NOTE — Telephone Encounter (Signed)
This is the patient that I released records from that had attention to you. I released what we had in the system from [facility] about his medical visits. Wouldn't they need to complete a records release for billing and wouldn't this be a billing issue? Please follow up with Gerald Olson.

## 2020-11-10 NOTE — Telephone Encounter (Addendum)
Kathe Becton with Dept of Health and human services calling to clarify some info. The pt's visit on  12/16/19 05/09/20 05/23/20  They are labeled as tele visit, however billing is not reflecting this looks like actual in office visits..   03/01/20 missing the entire visit (CT scan) (Look in section B, which states it covers all care affiliated with any medical provider)  They need the final itemized  billing statement for these dates - dx code, who made the payment and were there any adjustments.  Kathe Becton [phone] ext [phone] Ref no: [id number]

## 2020-11-10 NOTE — Telephone Encounter (Signed)
Called Kathe Becton back regarding patient medical records. Informed Olin Hauser that we can only release records from [facility] and she said she is missing records from 2021. Looked back at the release and saw that the records stopped in 2020. Told Olin Hauser that the missing records would be released.

## 2020-11-15 ENCOUNTER — Other Ambulatory Visit: Payer: Self-pay | Admitting: Oncology

## 2020-11-15 DIAGNOSIS — C9210 Chronic myeloid leukemia, BCR/ABL-positive, not having achieved remission: Secondary | ICD-10-CM

## 2020-11-17 MED FILL — IMATINIB MESYLATE 400 MG TA: 400 | 30 days supply | Qty: 30 | Fill #0

## 2020-11-18 MED FILL — metFORMIN HCL ER 500 MG TB2: 500 | 30 days supply | Qty: 120 | Fill #2

## 2020-11-22 ENCOUNTER — Ambulatory Visit
Admission: RE | Admit: 2020-11-22 | Discharge: 2020-11-22 | Disposition: A | Discharge status: Home / Self Care | Payer: Medicaid Other | Source: Ambulatory Visit | Attending: Student in an Organized Health Care Education/Training Program | Admitting: Student in an Organized Health Care Education/Training Program

## 2020-11-22 ENCOUNTER — Other Ambulatory Visit: Payer: Self-pay

## 2020-11-22 ENCOUNTER — Other Ambulatory Visit (HOSPITAL_COMMUNITY): Payer: Self-pay

## 2020-11-22 DIAGNOSIS — G894 Chronic pain syndrome: Secondary | ICD-10-CM | POA: Diagnosis not present

## 2020-11-22 DIAGNOSIS — M5136 Other intervertebral disc degeneration, lumbar region: Secondary | ICD-10-CM | POA: Insufficient documentation

## 2020-11-22 DIAGNOSIS — M5416 Radiculopathy, lumbar region: Secondary | ICD-10-CM | POA: Insufficient documentation

## 2020-11-22 DIAGNOSIS — M545 Low back pain, unspecified: Secondary | ICD-10-CM | POA: Diagnosis not present

## 2020-11-22 DIAGNOSIS — G8929 Other chronic pain: Secondary | ICD-10-CM | POA: Insufficient documentation

## 2020-11-22 MED ORDER — GADOBUTROL 1 MMOL/ML IV SOLN: 10.0000 mL | Freq: Once | INTRAVENOUS | Status: DC | PRN

## 2020-11-22 NOTE — Telephone Encounter (Signed)
Medical record request.

## 2020-11-25 ENCOUNTER — Telehealth: Payer: Self-pay

## 2020-11-25 NOTE — Telephone Encounter (Signed)
Copied from [place] [id number]. Topic: Medical Record Request - Other >> Nov 22, 2020 10:22 AM Alanda Slim E wrote: Patient Name/DOB/MRN #: Gerald Olson [birth year]/ mrn [id number] Requestor Name/Agency: Ms. Pearline Cables / [facility] Call Back #: [phone] ext [phone] Information Requested: missing DOS request for out patient imaging for 7.6.21/ please fax this missing DOS med rec/ ref# [id number]/ please fax to [phone]   Route to [place] for [place] clinics. For all other clinics, route to the clinic's PEC Pool. >> Nov 25, 2020 12:24 PM Alanda Slim E wrote: There was a bit of confusion with the office [facility] was calling/ they are needing the records for DOS 7.6.21 for outpatient imaging  and they are needing the Billing for DOS 4.21.21,  9.13.21 and 9.27.21/ these should be from [facility] / she will call back on Tuesday 4.5.22 to check on the status of this request / please advise    I released all records for our department. Unsure of how to further assist. Please advise.

## 2020-11-26 ENCOUNTER — Other Ambulatory Visit: Payer: Self-pay

## 2020-11-30 ENCOUNTER — Other Ambulatory Visit: Payer: Self-pay

## 2020-11-30 ENCOUNTER — Other Ambulatory Visit: Payer: Self-pay | Admitting: Adult Health

## 2020-11-30 DIAGNOSIS — I10 Essential (primary) hypertension: Secondary | ICD-10-CM

## 2020-11-30 MED ORDER — LISINOPRIL 20 MG PO TABS
ORAL_TABLET | Freq: Every day | ORAL | 0 refills | Status: DC
  Filled 2020-11-30: qty 90, 90d supply, fill #0

## 2020-11-30 MED ORDER — AMOXICILLIN 500 MG PO CAPS
500.0000 mg | ORAL_CAPSULE | Freq: Three times a day (TID) | ORAL | 0 refills | Status: AC
  Filled 2020-11-30 – 2020-12-01 (×2): qty 30, 10d supply, fill #0

## 2020-12-01 ENCOUNTER — Other Ambulatory Visit: Payer: Self-pay

## 2020-12-01 ENCOUNTER — Other Ambulatory Visit: Payer: Self-pay | Admitting: Urology

## 2020-12-02 NOTE — Telephone Encounter (Signed)
Gerald Olson from [facility] checking on the status of message below and would like a follow up call today. Time is sensitive

## 2020-12-02 NOTE — Telephone Encounter (Signed)
Requesting the following missing billing dates records for  12/16/2019 05/09/2020 and 05/23/2020 advised patient to call [facility]. Caller would to know the status of below, please advise

## 2020-12-07 ENCOUNTER — Inpatient Hospital Stay: Payer: Medicaid Other | Attending: Oncology

## 2020-12-07 DIAGNOSIS — Z87891 Personal history of nicotine dependence: Secondary | ICD-10-CM | POA: Insufficient documentation

## 2020-12-07 DIAGNOSIS — M549 Dorsalgia, unspecified: Secondary | ICD-10-CM | POA: Insufficient documentation

## 2020-12-07 DIAGNOSIS — R161 Splenomegaly, not elsewhere classified: Secondary | ICD-10-CM | POA: Insufficient documentation

## 2020-12-07 DIAGNOSIS — Z7984 Long term (current) use of oral hypoglycemic drugs: Secondary | ICD-10-CM | POA: Insufficient documentation

## 2020-12-07 DIAGNOSIS — C9210 Chronic myeloid leukemia, BCR/ABL-positive, not having achieved remission: Secondary | ICD-10-CM | POA: Diagnosis not present

## 2020-12-07 DIAGNOSIS — Z794 Long term (current) use of insulin: Secondary | ICD-10-CM | POA: Diagnosis not present

## 2020-12-07 DIAGNOSIS — M542 Cervicalgia: Secondary | ICD-10-CM | POA: Insufficient documentation

## 2020-12-07 DIAGNOSIS — Z79899 Other long term (current) drug therapy: Secondary | ICD-10-CM | POA: Insufficient documentation

## 2020-12-07 DIAGNOSIS — G8929 Other chronic pain: Secondary | ICD-10-CM | POA: Insufficient documentation

## 2020-12-07 LAB — COMPREHENSIVE METABOLIC PANEL
ALT: 29 U/L (ref 0–44)
AST: 41 U/L (ref 15–41)
Albumin: 3.3 g/dL — ABNORMAL LOW (ref 3.5–5.0)
Alkaline Phosphatase: 74 U/L (ref 38–126)
Anion gap: 7 (ref 5–15)
BUN: 12 mg/dL (ref 6–20)
CO2: 23 mmol/L (ref 22–32)
Calcium: 8.2 mg/dL — ABNORMAL LOW (ref 8.9–10.3)
Chloride: 107 mmol/L (ref 98–111)
Creatinine, Ser: 0.91 mg/dL (ref 0.61–1.24)
GFR, Estimated: >60 mL/min (ref >60)
Glucose, Bld: 252 mg/dL — ABNORMAL HIGH (ref 70–99)
Potassium: 4.1 mmol/L (ref 3.5–5.1)
Sodium: 137 mmol/L (ref 135–145)
Total Bilirubin: 0.7 mg/dL (ref 0.3–1.2)
Total Protein: 5.6 g/dL — ABNORMAL LOW (ref 6.5–8.1)

## 2020-12-07 LAB — CBC WITH DIFFERENTIAL/PLATELET
Abs Immature Granulocytes: 0.01 10*3/uL (ref 0.00–0.07)
Basophils Absolute: 0 10*3/uL (ref 0.0–0.1)
Basophils Relative: 1 %
Eosinophils Absolute: 0.2 10*3/uL (ref 0.0–0.5)
Eosinophils Relative: 4 %
HCT: 38.9 % — ABNORMAL LOW (ref 39.0–52.0)
Hemoglobin: 12.8 g/dL — ABNORMAL LOW (ref 13.0–17.0)
Immature Granulocytes: 0 %
Lymphocytes Relative: 23 %
Lymphs Abs: 1.1 10*3/uL (ref 0.7–4.0)
MCH: 29.5 pg (ref 26.0–34.0)
MCHC: 32.9 g/dL (ref 30.0–36.0)
MCV: 89.6 fL (ref 80.0–100.0)
Monocytes Absolute: 0.6 10*3/uL (ref 0.1–1.0)
Monocytes Relative: 13 %
Neutro Abs: 2.9 10*3/uL (ref 1.7–7.7)
Neutrophils Relative %: 59 %
Platelets: 209 10*3/uL (ref 150–400)
RBC: 4.34 MIL/uL (ref 4.22–5.81)
RDW: 14.4 % (ref 11.5–15.5)
WBC: 4.8 10*3/uL (ref 4.0–10.5)
nRBC: 0 % (ref 0.0–0.2)

## 2020-12-07 LAB — PHOSPHORUS: Phosphorus: 3.1 mg/dL (ref 2.5–4.6)

## 2020-12-07 LAB — MAGNESIUM: Magnesium: 1.9 mg/dL (ref 1.7–2.4)

## 2020-12-11 NOTE — Progress Notes (Signed)
[place]  [phone]) [phone] [phone]) [phone]  ID: Gerald Olson OB: 08-Dec-1965  MR#: [id number]  XLK#:[id number]  Patient Care Team: Doreen Beam, FNP as PCP - General (Family Medicine)   CHIEF COMPLAINT: CML.  INTERVAL HISTORY: Patient returns to clinic today for repeat laboratory work and further evaluation.  He continues to tolerate Gleevec well without significant side effects.  He continues to have chronic back and neck pain. He has no neurologic complaints. He denies any recent fevers or illnesses. He has a good appetite and denies weight loss. He has no chest pain, shortness of breath, cough, or hemoptysis. He denies any nausea, vomiting, constipation, or diarrhea. He has no melena or hematochezia. He has no urinary complaints.  Patient offers no further specific complaints today.  REVIEW OF SYSTEMS:   Review of Systems  Constitutional: Negative.  Negative for fever, malaise/fatigue and weight loss.  Respiratory: Negative.  Negative for cough, hemoptysis and shortness of breath.   Cardiovascular: Negative.  Negative for chest pain and leg swelling.  Gastrointestinal: Negative.  Negative for abdominal pain and blood in stool.  Genitourinary: Negative.  Negative for dysuria, flank pain and hematuria.  Musculoskeletal: Positive for back pain and neck pain.  Skin: Negative.  Negative for rash.  Neurological: Negative.  Negative for dizziness, focal weakness, weakness and headaches.  Psychiatric/Behavioral: Negative.  The patient is not nervous/anxious.     As per HPI. Otherwise, a complete review of systems is negative.  PAST MEDICAL HISTORY: Past Medical History:  Diagnosis Date  . ADHD (attention deficit hyperactivity disorder)    Diagnosed during teenage years  . Alcohol use disorder    On average 14-21 drinks per week  . Alcoholic cirrhosis of liver    Per patient - condition is not related to moderate alcohol use but rather  prolonged NSAID use and recent abuse.  . Allergy   . Calculus of gallbladder without cholecystitis without obstruction   . Chronic pain    Neck/lower back  . Degenerative disc disease, lumbar   . Diabetic polyneuropathy   . Essential hypertension, benign   . Generalized anxiety disorder   . Glaucoma    Per patient report  . Headache   . 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 Major depressive disorder   . Other spondylosis with radiculopathy, cervical region   . Retinopathy    Right eye; per patient report  . Type 2 diabetes mellitus with hyperglycemia     PAST SURGICAL HISTORY: Past Surgical History:  Procedure Laterality Date  . CHOLECYSTECTOMY N/A 06/02/2018   Procedure: LAPAROSCOPIC CHOLECYSTECTOMY;  Surgeon: Vickie Epley, MD;  Location: [facility] ORS;  Service: General;  Laterality: N/A;  . KNEE ARTHROSCOPY     30 Years ago    FAMILY HISTORY: Family History  Problem Relation Age of Onset  . Aneurysm Father   . Multiple sclerosis Sister   . Emphysema Maternal Grandfather   . Diabetes Neg Hx     ADVANCED DIRECTIVES (Y/N):  N  HEALTH MAINTENANCE: Social History   Tobacco Use  . Smoking status: Former Smoker    Quit date: 06/25/2003    Years since quitting: 17.4  . Smokeless tobacco: Never Used  . Tobacco comment: Currently vapes  Vaping Use  . Vaping Use: Every day  Substance Use Topics  . Alcohol use: Yes    Alcohol/week: 14.0 - 21.0 standard drinks    Types: 14 -  21 Cans of beer per week    Comment: 2-3 beers per night on average; sometimes up to 6  . Drug use: Yes    Types: Marijuana    Comment: 1/week to help sleep; edibles, does not smoke     Colonoscopy:  PAP:  Bone density:  Lipid panel:  Allergies  Allergen Reactions  . Hydrocodone Itching    Itching, face tingling, dizziness.  [name] [Oxycodone Hcl] Itching    Current Outpatient Medications  Medication Sig Dispense Refill  .  Accu-Chek FastClix Lancets MISC USE AS DIRECTED 2 TIMES DAILY 180 each 0  . ALPHA-LIPOIC ACID PO Take by mouth.    . Blood Glucose Monitoring Suppl (ACCU-CHEK GUIDE ME) w/Device KIT 1 kit by Does not apply route 3 (three) times daily. 1 kit 0  . Blood Glucose Monitoring Suppl (ACCU-CHEK GUIDE) w/Device KIT USE AS DIRECTED 1 kit 0  . calcium-vitamin D (OSCAL WITH D) 500-200 MG-UNIT tablet Take 1 tablet by mouth.    Marland Kitchen glucose blood test strip USE AS DIRECTED 2 TIMES DAILY (Patient taking differently: USE AS DIRECTED 2 TIMES DAILY) 180 strip 0  . insulin glargine (LANTUS SOLOSTAR) 100 UNIT/ML Solostar Pen Inject 22 Units into the skin every morning. And pen needles 1/day    . Insulin Pen Needle 32G X 4 MM MISC USE TO INJECT VICTOZA ONCE DAILY. 100 each 11  . lisinopril (ZESTRIL) 20 MG tablet TAKE 1 TABLET (20 MG TOTAL) BY MOUTH DAILY. 90 tablet 0  . metFORMIN (GLUCOPHAGE-XR) 500 MG 24 hr tablet TAKE 2 TABLETS (1,000 MG TOTAL) BY MOUTH 2 (TWO) TIMES DAILY WITH A MEAL. 120 tablet 2  . Multiple Vitamins-Minerals (MULTIVITAMIN WITH MINERALS) tablet Take 1 tablet by mouth daily.     . Nutritional Supplements (DHEA PO) Take 100 mg by mouth daily.    Marland Kitchen OVER THE COUNTER MEDICATION Black seed oil 2000per day    . topiramate (TOPAMAX) 50 MG tablet TAKE 1 TABLET BY MOUTH IN THE MORNING AND 2 TABLETS AT BEDTIME. PLEASE CALL YOUR SPECIALIST TO SCHEDULE FOLLOW UP 270 tablet 3  . traMADol (ULTRAM) 50 MG tablet TAKE 1-2 TABLETS (50-100 MG TOTAL) BY MOUTH 3 (THREE) TIMES DAILY AS NEEDED FOR SEVERE PAIN. MAY CAUSE DROWSINESS. 60 tablet 0  . zinc gluconate 50 MG tablet Take 50 mg by mouth daily.    Marland Kitchen imatinib (GLEEVEC) 400 MG tablet Take 1 tablet (400 mg total) by mouth daily. 30 tablet 5  . insulin glargine (LANTUS) 100 UNIT/ML Solostar Pen INJECT 18 UNITS INTO THE SKIN EVERY MORNING. 15 mL 2  . traMADol (ULTRAM) 50 MG tablet TAKE 1 TABLET (50 MG TOTAL) BY MOUTH EVERY 12 (TWELVE) HOURS AS NEEDED FOR UP TO 15 DAYS FOR  SEVERE PAIN. 30 tablet 0   No current facility-administered medications for this visit.    OBJECTIVE: Vitals:   12/15/20 1436  BP: 115/86  Pulse: (!) 107  Resp: 18  Temp: 98.6 F (37 C)  SpO2: 99%     Body mass index is 30.78 kg/m.    ECOG FS:0 - Asymptomatic  General: Well-developed, well-nourished, no acute distress. Eyes: Pink conjunctiva, anicteric sclera. HEENT: Normocephalic, moist mucous membranes. Lungs: No audible wheezing or coughing. Heart: Regular rate and rhythm. Abdomen: Soft, nontender, no obvious distention. Musculoskeletal: No edema, cyanosis, or clubbing. Neuro: Alert, answering all questions appropriately. Cranial nerves grossly intact. Skin: No rashes or petechiae noted. Psych: Normal affect.  LAB RESULTS:  Lab Results  Component Value Date  NA 137 12/07/2020   K 4.1 12/07/2020   CL 107 12/07/2020   CO2 23 12/07/2020   GLUCOSE 252 (H) 12/07/2020   BUN 12 12/07/2020   CREATININE 0.91 12/07/2020   CALCIUM 8.2 (L) 12/07/2020   PROT 5.6 (L) 12/07/2020   ALBUMIN 3.3 (L) 12/07/2020   AST 41 12/07/2020   ALT 29 12/07/2020   ALKPHOS 74 12/07/2020   BILITOT 0.7 12/07/2020   GFRNONAA >60 12/07/2020   GFRAA 90 09/14/2020    Lab Results  Component Value Date   WBC 4.8 12/07/2020   NEUTROABS 2.9 12/07/2020   HGB 12.8 (L) 12/07/2020   HCT 38.9 (L) 12/07/2020   MCV 89.6 12/07/2020   PLT 209 12/07/2020     STUDIES: MR LUMBAR SPINE WO CONTRAST  Result Date: 11/23/2020 CLINICAL DATA:  Osteoarthritis. Left leg pain and weakness, progressive. EXAM: MRI LUMBAR SPINE WITHOUT CONTRAST TECHNIQUE: Multiplanar, multisequence MR imaging of the lumbar spine was performed. No intravenous contrast was administered. COMPARISON:  MRI lumbar spine July 20, 2018. FINDINGS: Segmentation:  Standard. Alignment:  Grade 1 anterolisthesis of L5 on S1, similar to prior. Vertebrae: Vertebral body heights are maintained. No specific evidence of acute fracture,  discitis/osteomyelitis, or suspicious bone lesion. Degenerative/discogenic endplate signal changes about the right eccentric L5-S1 disc. Conus medullaris and cauda equina: Conus extends to the T12-L1 level. Conus appears normal. Paraspinal and other soft tissues: Unremarkable. Disc levels: T12-L1: Only imaged sagittally. Disc bulging and facet hypertrophy without evidence of significant canal or foraminal stenosis. L1-L2: Small broad disc bulge with small superimposed left subarticular disc protrusion. Mild facet hypertrophy. No significant canal or foraminal stenosis. L2-L3: No significant disc protrusion, foraminal stenosis, or canal stenosis. L3-L4: Similar left foraminal/far lateral disc protrusion which contacts the exiting/exited left L3 nerve root without displacement (series 8, image 20). No significant canal or foraminal stenosis. L4-L5: Broad disc bulge with superimposed left foraminal disc protrusion with annular fissure. Mild bilateral subarticular recess stenosis. No significant central canal stenosis. Mild left foraminal stenosis, similar to prior. No significant right foraminal stenosis. L5-S1: Grade 1 anterolisthesis of L5 on S1. Uncovering of the disc with superimposed disc bulge and bilateral foraminal disc protrusions. Severe bilateral facet arthropathy/hypertrophy. Similar right greater than left subarticular recess stenosis with possible impingement of the descending right S1 nerve root. Similar moderate bilateral foraminal stenosis. IMPRESSION: 1. Similar grade 1 anterolisthesis of L5 on S1 with right greater than left subarticular recess stenosis, possible impingement of the descending right S1 nerve root, and moderate right greater than left foraminal stenosis. 2. Similar disc bulge with annular fissure on the left at L4-L5 with mild left foraminal stenosis and mild bilateral subarticular recess stenosis. 3. Similar left foraminal/far lateral disc protrusion at L3-L4 which contacts the  exiting/exited left L3 nerve root, unchanged. Electronically Signed   By: Margaretha Sheffield MD   On: 11/23/2020 10:37   DG PAIN CLINIC C-ARM 1-60 MIN NO REPORT  Result Date: 12/12/2020 Fluoro was used, but no Radiologist interpretation will be provided. Please refer to "NOTES" tab for provider progress note.   ASSESSMENT: CML PLAN:    1.  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  Repeat CT scan results from July 18, 2020 reviewed independently and reported as above with only mild abdominal lymphadenopathy and mild splenomegaly that are chronic and unchanged.  Bone marrow biopsy was negative for disease.  Patient was initiated on 400 mg Gleevec  in approximately November 2021 and has been in complete molecular remission since January 2022.  Continue Gleevec as prescribed.  Return to clinic in 3 months for laboratory work and evaluation by clinical pharmacy and then in 6 months for laboratory work and evaluation by MD.   2. Iron deficiency: Previously recommended dietary changes. 3. Splenomegaly/mesenteric lymphadenopathy: Chronic and unchanged.  Unclear if this is related to CML.  Monitor.  Consider repeat imaging in July 2022. 4.  Back/neck pain: Patient reports he is changing orthopedics to the local surgeon.  Follow-up and treatment per orthopedic physician.   I spent a total of 30 minutes reviewing chart data, face-to-face evaluation with the patient, counseling and coordination of care as detailed above.    Patient expressed understanding and was in agreement with this plan. He also understands that He can call clinic at any time with any questions, concerns, or complaints.    Lloyd Huger, MD   12/18/2020 9:28 AM

## 2020-12-12 ENCOUNTER — Encounter: Payer: Self-pay | Admitting: Student in an Organized Health Care Education/Training Program

## 2020-12-12 ENCOUNTER — Ambulatory Visit (HOSPITAL_BASED_OUTPATIENT_CLINIC_OR_DEPARTMENT_OTHER): Payer: Medicaid Other | Admitting: Student in an Organized Health Care Education/Training Program

## 2020-12-12 ENCOUNTER — Ambulatory Visit
Admission: RE | Admit: 2020-12-12 | Discharge: 2020-12-12 | Disposition: A | Discharge status: Home / Self Care | Payer: Medicaid Other | Source: Ambulatory Visit | Attending: Student in an Organized Health Care Education/Training Program | Admitting: Student in an Organized Health Care Education/Training Program

## 2020-12-12 ENCOUNTER — Other Ambulatory Visit: Payer: Self-pay

## 2020-12-12 VITALS — BP 141/98 | HR 105 | Temp 97.2°F | Resp 14 | Ht 73.75 in | Wt 232.0 lb

## 2020-12-12 DIAGNOSIS — G894 Chronic pain syndrome: Secondary | ICD-10-CM

## 2020-12-12 DIAGNOSIS — M5416 Radiculopathy, lumbar region: Secondary | ICD-10-CM | POA: Insufficient documentation

## 2020-12-12 DIAGNOSIS — G8929 Other chronic pain: Secondary | ICD-10-CM

## 2020-12-12 MED ORDER — SODIUM CHLORIDE (PF) 0.9 % IJ SOLN
INTRAMUSCULAR | Status: AC
  Filled 2020-12-12: qty 10

## 2020-12-12 MED ORDER — IOHEXOL 180 MG/ML  SOLN
10.0000 mL | Freq: Once | INTRAMUSCULAR | Status: AC
  Administered 2020-12-12: 10 mL via EPIDURAL

## 2020-12-12 MED ORDER — DEXAMETHASONE SODIUM PHOSPHATE 10 MG/ML IJ SOLN
INTRAMUSCULAR | Status: AC
  Filled 2020-12-12: qty 1

## 2020-12-12 MED ORDER — DEXAMETHASONE SODIUM PHOSPHATE 10 MG/ML IJ SOLN
10.0000 mg | Freq: Once | INTRAMUSCULAR | Status: AC
  Administered 2020-12-12: 10 mg

## 2020-12-12 MED ORDER — IOHEXOL 180 MG/ML  SOLN
INTRAMUSCULAR | Status: AC
  Filled 2020-12-12: qty 20

## 2020-12-12 MED ORDER — LIDOCAINE HCL (PF) 2 % IJ SOLN
INTRAMUSCULAR | Status: AC
  Filled 2020-12-12: qty 5

## 2020-12-12 MED ORDER — LIDOCAINE HCL 2 % IJ SOLN
20.0000 mL | Freq: Once | INTRAMUSCULAR | Status: AC
  Administered 2020-12-12: 400 mg

## 2020-12-12 MED ORDER — ROPIVACAINE HCL 2 MG/ML IJ SOLN
INTRAMUSCULAR | Status: AC
  Filled 2020-12-12: qty 10

## 2020-12-12 MED ORDER — SODIUM CHLORIDE 0.9% FLUSH
2.0000 mL | Freq: Once | INTRAVENOUS | Status: AC
  Administered 2020-12-12: 2 mL

## 2020-12-12 MED ORDER — ROPIVACAINE HCL 2 MG/ML IJ SOLN
2.0000 mL | Freq: Once | INTRAMUSCULAR | Status: AC
  Administered 2020-12-12: 2 mL via EPIDURAL

## 2020-12-12 NOTE — Progress Notes (Signed)
PROVIDER NOTE: Information contained herein reflects review and annotations entered in association with encounter. Interpretation of such information and data should be left to medically-trained personnel. Information provided to patient can be located elsewhere in the medical record under "Patient Instructions". Document created using STT-dictation technology, any transcriptional errors that may result from process are unintentional.    Patient: Gerald Olson  Service Category: Procedure  Provider: Gillis Santa, MD  DOB: [birth year]  DOS: 12/12/2020  Location: [facility]  MRN: [id number]  Setting: Ambulatory - outpatient  Referring Provider: Sharmon Leyden*  Type: Established Patient  Specialty: Interventional Pain Management  PCP: Doreen Beam, FNP   Primary Reason for Visit: Interventional Pain Management Treatment. CC: Back Pain (Lumbar bilateral worse on the left /Mid back )  Procedure:          Anesthesia, Analgesia, Anxiolysis:  Type: Therapeutic Inter-Laminar Epidural Steroid Injection  #1  Region: Lumbar Level: L4-5 Level. Laterality: Left-Sided         Type: Local Anesthesia  Local Anesthetic: Lidocaine 1-2%  Position: Prone with head of the table was raised to facilitate breathing.   Indications: 1. Chronic radicular lumbar pain   2. Chronic pain syndrome    Pain Score: Pre-procedure: 8 /10 Post-procedure: 3  (moving,vending and walking)/10   Pre-op H&P Assessment:  Gerald Olson is a 55 y.o. (year old), male patient, seen today for interventional treatment. He  has a past surgical history that includes Knee arthroscopy and Cholecystectomy (N/A, 06/02/2018). Gerald Olson has a current medication list which includes the following prescription(s): accu-chek fastclix lancets, alpha-lipoic acid, amoxicillin, accu-chek guide me, accu-chek guide, calcium-vitamin d, glucose blood, imatinib, lantus solostar, insulin pen needle, lisinopril, metformin,  multivitamin with minerals, NONFORMULARY OR COMPOUNDED ITEM, nutritional supplements, OVER THE COUNTER MEDICATION, topiramate, tramadol, zinc gluconate, insulin glargine, prednisone, tramadol, and [DISCONTINUED] duloxetine. His primarily concern today is the Back Pain (Lumbar bilateral worse on the left /Mid back )  Initial Vital Signs:  Pulse/HCG Rate: (!) 105  Temp: (!) 97.2 F (36.2 C) Resp: 16 BP: (!) 164/83 SpO2: 100 %  BMI: Estimated body mass index is 29.99 kg/m as calculated from the following:   Height as of this encounter: 6' 1.75" (1.873 m).   Weight as of this encounter: 232 lb (105.2 kg).  Risk Assessment: Allergies: Reviewed. He is allergic to hydrocodone and oxycontin [oxycodone hcl].  Allergy Precautions: None required Coagulopathies: Reviewed. None identified.  Blood-thinner therapy: None at this time Active Infection(s): Reviewed. None identified. Gerald Olson is afebrile  Site Confirmation: Gerald Olson was asked to confirm the procedure and laterality before marking the site Procedure checklist: Completed Consent: Before the procedure and under the influence of no sedative(s), amnesic(s), or anxiolytics, the patient was informed of the treatment options, risks and possible complications. To fulfill our ethical and legal obligations, as recommended by the American Medical Association's Code of Ethics, I have informed the patient of my clinical impression; the nature and purpose of the treatment or procedure; the risks, benefits, and possible complications of the intervention; the alternatives, including doing nothing; the risk(s) and benefit(s) of the alternative treatment(s) or procedure(s); and the risk(s) and benefit(s) of doing nothing. The patient was provided information about the general risks and possible complications associated with the procedure. These may include, but are not limited to: failure to achieve desired goals, infection, bleeding, organ or nerve damage,  allergic reactions, paralysis, and death. In addition, the patient was informed of those risks and  complications associated to Spine-related procedures, such as failure to decrease pain; infection (i.e.: Meningitis, epidural or intraspinal abscess); bleeding (i.e.: epidural hematoma, subarachnoid hemorrhage, or any other type of intraspinal or peri-dural bleeding); organ or nerve damage (i.e.: Any type of peripheral nerve, nerve root, or spinal cord injury) with subsequent damage to sensory, motor, and/or autonomic systems, resulting in permanent pain, numbness, and/or weakness of one or several areas of the body; allergic reactions; (i.e.: anaphylactic reaction); and/or death. Furthermore, the patient was informed of those risks and complications associated with the medications. These include, but are not limited to: allergic reactions (i.e.: anaphylactic or anaphylactoid reaction(s)); adrenal axis suppression; blood sugar elevation that in diabetics may result in ketoacidosis or comma; water retention that in patients with history of congestive heart failure may result in shortness of breath, pulmonary edema, and decompensation with resultant heart failure; weight gain; swelling or edema; medication-induced neural toxicity; particulate matter embolism and blood vessel occlusion with resultant organ, and/or nervous system infarction; and/or aseptic necrosis of one or more joints. Finally, the patient was informed that Medicine is not an exact science; therefore, there is also the possibility of unforeseen or unpredictable risks and/or possible complications that may result in a catastrophic outcome. The patient indicated having understood very clearly. We have given the patient no guarantees and we have made no promises. Enough time was given to the patient to ask questions, all of which were answered to the patient's satisfaction. Gerald Olson has indicated that he wanted to continue with the  procedure. Attestation: I, the ordering provider, attest that I have discussed with the patient the benefits, risks, side-effects, alternatives, likelihood of achieving goals, and potential problems during recovery for the procedure that I have provided informed consent. Date  Time: 12/12/2020 11:50 AM  Pre-Procedure Preparation:  Monitoring: As per clinic protocol. Respiration, ETCO2, SpO2, BP, heart rate and rhythm monitor placed and checked for adequate function Safety Precautions: Patient was assessed for positional comfort and pressure points before starting the procedure. Time-out: I initiated and conducted the "Time-out" before starting the procedure, as per protocol. The patient was asked to participate by confirming the accuracy of the "Time Out" information. Verification of the correct person, site, and procedure were performed and confirmed by me, the nursing staff, and the patient. "Time-out" conducted as per Joint Commission's Universal Protocol (UP.01.01.01). Time: 1219  Description of Procedure:          Target Area: The interlaminar space, initially targeting the lower laminar border of the superior vertebral body. Approach: Paramedial approach. Area Prepped: Entire Posterior Lumbar Region DuraPrep (Iodine Povacrylex [0.7% available iodine] and Isopropyl Alcohol, 74% w/w) Safety Precautions: Aspiration looking for blood return was conducted prior to all injections. At no point did we inject any substances, as a needle was being advanced. No attempts were made at seeking any paresthesias. Safe injection practices and needle disposal techniques used. Medications properly checked for expiration dates. SDV (single dose vial) medications used. Description of the Procedure: Protocol guidelines were followed. The procedure needle was introduced through the skin, ipsilateral to the reported pain, and advanced to the target area. Bone was contacted and the needle walked caudad, until the  lamina was cleared. The epidural space was identified using "loss-of-resistance technique" with 2-3 ml of PF-NaCl (0.9% NSS), in a 5cc LOR glass syringe.  Vitals:   12/12/20 1153 12/12/20 1200 12/12/20 1215 12/12/20 1225  BP: (!) 164/83  (!) 139/99 (!) 141/98  Pulse: (!) 105 95 (!) 108 (!) 105  Resp: 16  12 14   Temp: (!) 97.2 F (36.2 C)     TempSrc: Temporal     SpO2: 100%  98% 98%  Weight: 232 lb (105.2 kg)     Height: 6' 1.75" (1.873 m)       Start Time: 1219 hrs. End Time: 1223 hrs.  Materials:  Needle(s) Type: Epidural needle Gauge: 17G Length: 3.5-in Medication(s): Please see orders for medications and dosing details.  Imaging Guidance (Spinal):          Type of Imaging Technique: Fluoroscopy Guidance (Spinal) Indication(s): Assistance in needle guidance and placement for procedures requiring needle placement in or near specific anatomical locations not easily accessible without such assistance. Exposure Time: Please see nurses notes. Contrast: Before injecting any contrast, we confirmed that the patient did not have an allergy to iodine, shellfish, or radiological contrast. Once satisfactory needle placement was completed at the desired level, radiological contrast was injected. Contrast injected under live fluoroscopy. No contrast complications. See chart for type and volume of contrast used. Fluoroscopic Guidance: I was personally present during the use of fluoroscopy. "Tunnel Vision Technique" used to obtain the best possible view of the target area. Parallax error corrected before commencing the procedure. "Direction-depth-direction" technique used to introduce the needle under continuous pulsed fluoroscopy. Once target was reached, antero-posterior, oblique, and lateral fluoroscopic projection used confirm needle placement in all planes. Images permanently stored in EMR. Interpretation: I personally interpreted the imaging intraoperatively. Adequate needle placement  confirmed in multiple planes. Appropriate spread of contrast into desired area was observed. No evidence of afferent or efferent intravascular uptake. No intrathecal or subarachnoid spread observed. Permanent images saved into the patient's record.  Antibiotic Prophylaxis:   Anti-infectives (From admission, onward)   None     Indication(s): None identified  Post-operative Assessment:  Post-procedure Vital Signs:  Pulse/HCG Rate: (!) 105 (st)  Temp: (!) 97.2 F (36.2 C) Resp: 14 BP: (!) 141/98 SpO2: 98 %  EBL: None  Complications: No immediate post-treatment complications observed by team, or reported by patient.  Note: The patient tolerated the entire procedure well. A repeat set of vitals were taken after the procedure and the patient was kept under observation following institutional policy, for this type of procedure. Post-procedural neurological assessment was performed, showing return to baseline, prior to discharge. The patient was provided with post-procedure discharge instructions, including a section on how to identify potential problems. Should any problems arise concerning this procedure, the patient was given instructions to immediately contact us, at any time, without hesitation. In any case, we plan to contact the patient by telephone for a follow-up status report regarding this interventional procedure.  Comments:  No additional relevant information.  Plan of Care  Orders:  Orders Placed This Encounter  Procedures  . DG PA[facility] C-ARM 1-60 MIN NO REPORT    Intraoperative interpretation by procedural physician at [place].    Standing Status:   Standing    Number of Occurrences:   1    Order Specific Question:   Reason for exam:    Answer:   Assistance in needle guidance and placement for procedures requiring needle placement in or near specific anatomical locations not easily accessible without such assistance.   Medications ordered for  procedure: Meds ordered this encounter  Medications  . iohexol (OMNIPAQUE) 180 MG/ML injection 10 mL    Must be Myelogram-compatible. If not available, you may substitute with a water-soluble, non-ionic, hypoallergenic, myelogram-compatible radiological contrast medium.  Marland Kitchen lidocaine (  XYLOCAINE) 2 % (with pres) injection 400 mg  . ropivacaine (PF) 2 mg/mL (0.2%) (NAROPIN) injection 2 mL  . sodium chloride flush (NS) 0.9 % injection 2 mL  . dexamethasone (DECADRON) injection 10 mg   Medications administered: We administered iohexol, lidocaine, ropivacaine (PF) 2 mg/mL (0.2%), sodium chloride flush, and dexamethasone.  See the medical record for exact dosing, route, and time of administration.  Follow-up plan:   Return in about 4 weeks (around 01/09/2021) for Post Procedure Evaluation, virtual.     Left L4-L5 ESI #1 12/12/2020   Recent Visits Date Type Provider Dept  10/26/20 Office Visit Gerald Santa, MD [facility]  Showing recent visits within past 90 days and meeting all other requirements Today's Visits Date Type Provider Dept  12/12/20 Procedure visit Gerald Santa, MD [facility]  Showing today's visits and meeting all other requirements Future Appointments Date Type Provider Dept  01/03/21 Appointment Gerald Santa, MD [facility]  Showing future appointments within next 90 days and meeting all other requirements  Disposition: Discharge home  Discharge (Date  Time): 12/12/2020;   hrs.   Primary Care Physician: Doreen Beam, FNP Location: [facility] Note by: Gerald Santa, MD Date: 12/12/2020; Time: 12:36 PM  Disclaimer:  Medicine is not an exact science. The only guarantee in medicine is that nothing is guaranteed. It is important to note that the decision to proceed with this intervention was based on the information collected from the patient. The Data and conclusions were drawn from the patient's  questionnaire, the interview, and the physical examination. Because the information was provided in large part by the patient, it cannot be guaranteed that it has not been purposely or unconsciously manipulated. Every effort has been made to obtain as much relevant data as possible for this evaluation. It is important to note that the conclusions that lead to this procedure are derived in large part from the available data. Always take into account that the treatment will also be dependent on availability of resources and existing treatment guidelines, considered by other Pain Management Practitioners as being common knowledge and practice, at the time of the intervention. For Medico-Legal purposes, it is also important to point out that variation in procedural techniques and pharmacological choices are the acceptable norm. The indications, contraindications, technique, and results of the above procedure should only be interpreted and judged by a Board-Certified Interventional Pain Specialist with extensive familiarity and expertise in the same exact procedure and technique.

## 2020-12-12 NOTE — Patient Instructions (Signed)

## 2020-12-12 NOTE — Progress Notes (Signed)
Safety precautions to be maintained throughout the outpatient stay will include: orient to surroundings, keep bed in low position, maintain call bell within reach at all times, provide assistance with transfer out of bed and ambulation.  

## 2020-12-13 ENCOUNTER — Telehealth: Payer: Self-pay | Admitting: *Deleted

## 2020-12-13 ENCOUNTER — Other Ambulatory Visit (HOSPITAL_COMMUNITY): Payer: Self-pay

## 2020-12-13 NOTE — Telephone Encounter (Signed)
Attempted to call for post procedure follow-up. Message left. 

## 2020-12-14 ENCOUNTER — Ambulatory Visit: Payer: Medicaid Other | Admitting: Student in an Organized Health Care Education/Training Program

## 2020-12-14 LAB — BCR-ABL1, CML/ALL, PCR, QUANT: Interpretation (BCRAL):: NEGATIVE

## 2020-12-15 ENCOUNTER — Other Ambulatory Visit: Payer: Self-pay

## 2020-12-15 ENCOUNTER — Inpatient Hospital Stay (HOSPITAL_BASED_OUTPATIENT_CLINIC_OR_DEPARTMENT_OTHER): Payer: Medicaid Other | Admitting: Oncology

## 2020-12-15 ENCOUNTER — Other Ambulatory Visit (HOSPITAL_COMMUNITY): Payer: Self-pay

## 2020-12-15 ENCOUNTER — Encounter: Payer: Self-pay | Admitting: Oncology

## 2020-12-15 ENCOUNTER — Inpatient Hospital Stay: Payer: Medicaid Other | Admitting: Pharmacist

## 2020-12-15 VITALS — BP 115/86 | HR 107 | Temp 98.6°F | Resp 18 | Ht 73.0 in | Wt 233.3 lb

## 2020-12-15 DIAGNOSIS — Z7984 Long term (current) use of oral hypoglycemic drugs: Secondary | ICD-10-CM | POA: Diagnosis not present

## 2020-12-15 DIAGNOSIS — R161 Splenomegaly, not elsewhere classified: Secondary | ICD-10-CM | POA: Diagnosis not present

## 2020-12-15 DIAGNOSIS — M542 Cervicalgia: Secondary | ICD-10-CM | POA: Diagnosis not present

## 2020-12-15 DIAGNOSIS — C9210 Chronic myeloid leukemia, BCR/ABL-positive, not having achieved remission: Secondary | ICD-10-CM

## 2020-12-15 DIAGNOSIS — Z79899 Other long term (current) drug therapy: Secondary | ICD-10-CM | POA: Diagnosis not present

## 2020-12-15 DIAGNOSIS — G8929 Other chronic pain: Secondary | ICD-10-CM | POA: Diagnosis not present

## 2020-12-15 DIAGNOSIS — M549 Dorsalgia, unspecified: Secondary | ICD-10-CM | POA: Diagnosis not present

## 2020-12-15 DIAGNOSIS — Z87891 Personal history of nicotine dependence: Secondary | ICD-10-CM | POA: Diagnosis not present

## 2020-12-15 DIAGNOSIS — Z794 Long term (current) use of insulin: Secondary | ICD-10-CM | POA: Diagnosis not present

## 2020-12-15 MED ORDER — IMATINIB MESYLATE 400 MG PO TABS
400.0000 mg | ORAL_TABLET | Freq: Every day | ORAL | 5 refills | Status: DC
  Filled 2020-12-15 (×2): qty 30, 30d supply, fill #0
  Filled 2021-02-22: qty 30, 30d supply, fill #1
  Filled 2021-04-06: qty 30, 30d supply, fill #2
  Filled 2021-05-18: qty 30, 30d supply, fill #3
  Filled 2021-06-27: qty 30, 30d supply, fill #4
  Filled 2021-08-01: qty 30, 30d supply, fill #5

## 2020-12-15 NOTE — Progress Notes (Signed)
[place]  Telephone:([phone] Fax:([phone]  Patient Care Team: Doreen Beam, FNP as PCP - General (Family Medicine)   Name of the patient: Gerald Olson  [id number]  July 24, 1966   Date of visit: 12/15/20  HPI: Patient is a 55 y.o. male with newly diagnosed CML. Currently treated with imatinib, which was started on 07/28/20.  Reason for Consult: Oral chemotherapy follow-up for imatinib therapy.   PAST MEDICAL HISTORY: Past Medical History:  Diagnosis Date  . ADHD (attention deficit hyperactivity disorder)    Diagnosed during teenage years  . Alcohol use disorder    On average 14-21 drinks per week  . Alcoholic cirrhosis of liver    Per patient - condition is not related to moderate alcohol use but rather prolonged NSAID use and recent abuse.  . Allergy   . Calculus of gallbladder without cholecystitis without obstruction   . Chronic pain    Neck/lower back  . Degenerative disc disease, lumbar   . Diabetic polyneuropathy   . Essential hypertension, benign   . Generalized anxiety disorder   . Glaucoma    Per patient report  . Headache   . 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 Major depressive disorder   . Other spondylosis with radiculopathy, cervical region   . Retinopathy    Right eye; per patient report  . Type 2 diabetes mellitus with hyperglycemia     PAST SURGICAL HISTORY:  Past Surgical History:  Procedure Laterality Date  . CHOLECYSTECTOMY N/A 06/02/2018   Procedure: LAPAROSCOPIC CHOLECYSTECTOMY;  Surgeon: Vickie Epley, MD;  Location: [facility] ORS;  Service: General;  Laterality: N/A;  . KNEE ARTHROSCOPY     30 Years ago    HEMATOLOGY/ONCOLOGY HISTORY:  Oncology History   No history exists.    ALLERGIES:  is allergic to hydrocodone and oxycontin [oxycodone hcl].  MEDICATIONS:  Current Outpatient Medications  Medication Sig Dispense  Refill  . Accu-Chek FastClix Lancets MISC USE AS DIRECTED 2 TIMES DAILY 180 each 0  . ALPHA-LIPOIC ACID PO Take by mouth.    . Blood Glucose Monitoring Suppl (ACCU-CHEK GUIDE ME) w/Device KIT 1 kit by Does not apply route 3 (three) times daily. 1 kit 0  . Blood Glucose Monitoring Suppl (ACCU-CHEK GUIDE) w/Device KIT USE AS DIRECTED 1 kit 0  . calcium-vitamin D (OSCAL WITH D) 500-200 MG-UNIT tablet Take 1 tablet by mouth.    Marland Kitchen glucose blood test strip USE AS DIRECTED 2 TIMES DAILY (Patient taking differently: USE AS DIRECTED 2 TIMES DAILY) 180 strip 0  . imatinib (GLEEVEC) 400 MG tablet TAKE 1 TABLET (400 MG TOTAL) BY MOUTH DAILY. TAKE WITH MEALS AND LARGE GLASS OF WATER. 30 tablet 0  . insulin glargine (LANTUS SOLOSTAR) 100 UNIT/ML Solostar Pen Inject 22 Units into the skin every morning. And pen needles 1/day    . insulin glargine (LANTUS) 100 UNIT/ML Solostar Pen INJECT 18 UNITS INTO THE SKIN EVERY MORNING. 15 mL 2  . Insulin Pen Needle 32G X 4 MM MISC USE TO INJECT VICTOZA ONCE DAILY. 100 each 11  . lisinopril (ZESTRIL) 20 MG tablet TAKE 1 TABLET (20 MG TOTAL) BY MOUTH DAILY. 90 tablet 0  . metFORMIN (GLUCOPHAGE-XR) 500 MG 24 hr tablet TAKE 2 TABLETS (1,000 MG TOTAL) BY MOUTH 2 (TWO) TIMES DAILY WITH A MEAL. 120 tablet 2  . Multiple Vitamins-Minerals (MULTIVITAMIN WITH MINERALS) tablet Take 1 tablet by mouth  daily.     . Nutritional Supplements (DHEA PO) Take 100 mg by mouth daily.    Marland Kitchen OVER THE COUNTER MEDICATION Black seed oil 2000per day    . topiramate (TOPAMAX) 50 MG tablet TAKE 1 TABLET BY MOUTH IN THE MORNING AND 2 TABLETS AT BEDTIME. PLEASE CALL YOUR SPECIALIST TO SCHEDULE FOLLOW UP 270 tablet 3  . traMADol (ULTRAM) 50 MG tablet TAKE 1-2 TABLETS (50-100 MG TOTAL) BY MOUTH 3 (THREE) TIMES DAILY AS NEEDED FOR SEVERE PAIN. MAY CAUSE DROWSINESS. 60 tablet 0  . traMADol (ULTRAM) 50 MG tablet TAKE 1 TABLET (50 MG TOTAL) BY MOUTH EVERY 12 (TWELVE) HOURS AS NEEDED FOR UP TO 15 DAYS FOR SEVERE  PAIN. 30 tablet 0  . zinc gluconate 50 MG tablet Take 50 mg by mouth daily.     No current facility-administered medications for this visit.    VITAL SIGNS: There were no vitals taken for this visit. There were no vitals filed for this visit.  Estimated body mass index is 30.78 kg/m as calculated from the following:   Height as of an earlier encounter on 12/15/20: $RemoveBef'6\' 1"'nEZriUVyQT$  (1.854 m).   Weight as of an earlier encounter on 12/15/20: 105.8 kg (233 lb 4.8 oz).  LABS: CBC:    Component Value Date/Time   WBC 4.8 12/07/2020 1446   HGB 12.8 (L) 12/07/2020 1446   HGB 14.1 09/14/2020 1554   HCT 38.9 (L) 12/07/2020 1446   HCT 42.3 09/14/2020 1554   PLT 209 12/07/2020 1446   PLT 263 09/14/2020 1554   MCV 89.6 12/07/2020 1446   MCV 88 09/14/2020 1554   NEUTROABS 2.9 12/07/2020 1446   NEUTROABS 3.9 09/14/2020 1554   LYMPHSABS 1.1 12/07/2020 1446   LYMPHSABS 1.7 09/14/2020 1554   MONOABS 0.6 12/07/2020 1446   EOSABS 0.2 12/07/2020 1446   EOSABS 0.1 09/14/2020 1554   BASOSABS 0.0 12/07/2020 1446   BASOSABS 0.0 09/14/2020 1554   Comprehensive Metabolic Panel:    Component Value Date/Time   NA 137 12/07/2020 1446   NA 139 09/14/2020 1554   K 4.1 12/07/2020 1446   CL 107 12/07/2020 1446   CO2 23 12/07/2020 1446   BUN 12 12/07/2020 1446   BUN 9 09/14/2020 1554   CREATININE 0.91 12/07/2020 1446   CREATININE 0.80 01/12/2019 1452   GLUCOSE 252 (H) 12/07/2020 1446   CALCIUM 8.2 (L) 12/07/2020 1446   AST 41 12/07/2020 1446   ALT 29 12/07/2020 1446   ALKPHOS 74 12/07/2020 1446   BILITOT 0.7 12/07/2020 1446   BILITOT 0.6 09/14/2020 1554   PROT 5.6 (L) 12/07/2020 1446   PROT 6.8 09/14/2020 1554   ALBUMIN 3.3 (L) 12/07/2020 1446   ALBUMIN 4.2 09/14/2020 1554    RADIOGRAPHIC STUDIES: MR LUMBAR SPINE WO CONTRAST  Result Date: 11/23/2020 CLINICAL DATA:  Osteoarthritis. Left leg pain and weakness, progressive. EXAM: MRI LUMBAR SPINE WITHOUT CONTRAST TECHNIQUE: Multiplanar, multisequence  MR imaging of the lumbar spine was performed. No intravenous contrast was administered. COMPARISON:  MRI lumbar spine July 20, 2018. FINDINGS: Segmentation:  Standard. Alignment:  Grade 1 anterolisthesis of L5 on S1, similar to prior. Vertebrae: Vertebral body heights are maintained. No specific evidence of acute fracture, discitis/osteomyelitis, or suspicious bone lesion. Degenerative/discogenic endplate signal changes about the right eccentric L5-S1 disc. Conus medullaris and cauda equina: Conus extends to the T12-L1 level. Conus appears normal. Paraspinal and other soft tissues: Unremarkable. Disc levels: T12-L1: Only imaged sagittally. Disc bulging and facet hypertrophy without evidence of significant  canal or foraminal stenosis. L1-L2: Small broad disc bulge with small superimposed left subarticular disc protrusion. Mild facet hypertrophy. No significant canal or foraminal stenosis. L2-L3: No significant disc protrusion, foraminal stenosis, or canal stenosis. L3-L4: Similar left foraminal/far lateral disc protrusion which contacts the exiting/exited left L3 nerve root without displacement (series 8, image 20). No significant canal or foraminal stenosis. L4-L5: Broad disc bulge with superimposed left foraminal disc protrusion with annular fissure. Mild bilateral subarticular recess stenosis. No significant central canal stenosis. Mild left foraminal stenosis, similar to prior. No significant right foraminal stenosis. L5-S1: Grade 1 anterolisthesis of L5 on S1. Uncovering of the disc with superimposed disc bulge and bilateral foraminal disc protrusions. Severe bilateral facet arthropathy/hypertrophy. Similar right greater than left subarticular recess stenosis with possible impingement of the descending right S1 nerve root. Similar moderate bilateral foraminal stenosis. IMPRESSION: 1. Similar grade 1 anterolisthesis of L5 on S1 with right greater than left subarticular recess stenosis, possible impingement of  the descending right S1 nerve root, and moderate right greater than left foraminal stenosis. 2. Similar disc bulge with annular fissure on the left at L4-L5 with mild left foraminal stenosis and mild bilateral subarticular recess stenosis. 3. Similar left foraminal/far lateral disc protrusion at L3-L4 which contacts the exiting/exited left L3 nerve root, unchanged. Electronically Signed   By: Margaretha Sheffield MD   On: 11/23/2020 10:37   DG PAIN CLINIC C-ARM 1-60 MIN NO REPORT  Result Date: 12/12/2020 Fluoro was used, but no Radiologist interpretation will be provided. Please refer to "NOTES" tab for provider progress note.    Assessment and Plan-  Continue imatinib $RemoveBeforeDEI'400mg'[id number]$    Oral Chemotherapy Side Effect/Intolerance:  Potential side effects reviewed, none reported.   Oral Chemotherapy Adherence:  Patient reported 4 missed doses, in the last month. He reports the doses were missed on days he arrived home very tired or in pain and went straight to bed.   Medication Access Issues: [facility] has attempted to call Mr. Keats for a refill. During today's visit he stated he just opened a new bottle about a week ago. [facility] imatinib shipment for 5/4, expected delivery 5/5.  No patient barriers to medication adherence identified.   Other:  -He is has an appt with neurology next week, he is hopefully to be able to go for his back surgery -He continues to walk his driveway frequently  Patient expressed understanding and was in agreement with this plan. He also understands that He can call clinic at any time with any questions, concerns, or complaints.   Thank you for allowing me to participate in the care of this very pleasant patient.   Time Total: 15 mins  Visit consisted of counseling and education on dealing with issues of symptom management in the setting of serious and potentially life-threatening illness.Greater than 50%  of this time was spent  counseling and coordinating care related to the above assessment and plan.  Signed by: [name], PharmD, BCPS, Salley Slaughter, CPP Hematology/Oncology Clinical Pharmacist Practitioner [facility] [phone]  12/15/2020 4:21 PM

## 2020-12-16 ENCOUNTER — Other Ambulatory Visit (HOSPITAL_COMMUNITY): Payer: Self-pay

## 2020-12-19 DIAGNOSIS — Z0271 Encounter for disability determination: Secondary | ICD-10-CM

## 2020-12-21 DIAGNOSIS — Z6829 Body mass index (BMI) 29.0-29.9, adult: Secondary | ICD-10-CM | POA: Diagnosis not present

## 2020-12-21 DIAGNOSIS — M4317 Spondylolisthesis, lumbosacral region: Secondary | ICD-10-CM | POA: Diagnosis not present

## 2020-12-21 DIAGNOSIS — I10 Essential (primary) hypertension: Secondary | ICD-10-CM | POA: Diagnosis not present

## 2020-12-27 ENCOUNTER — Other Ambulatory Visit: Payer: Self-pay

## 2020-12-27 ENCOUNTER — Other Ambulatory Visit: Payer: Self-pay | Admitting: Endocrinology

## 2020-12-27 DIAGNOSIS — E1165 Type 2 diabetes mellitus with hyperglycemia: Secondary | ICD-10-CM

## 2020-12-27 MED ORDER — METFORMIN HCL ER 500 MG PO TB24
ORAL_TABLET | Freq: Two times a day (BID) | ORAL | 2 refills | Status: DC
  Filled 2020-12-27: qty 120, 30d supply, fill #0
  Filled 2021-02-28: qty 120, 30d supply, fill #1

## 2020-12-27 MED FILL — Tramadol HCl Tab 50 MG: ORAL | 5 days supply | Qty: 30 | Fill #0 | Status: AC

## 2020-12-28 ENCOUNTER — Other Ambulatory Visit (HOSPITAL_COMMUNITY): Payer: Self-pay

## 2020-12-28 ENCOUNTER — Other Ambulatory Visit: Payer: Self-pay

## 2020-12-28 ENCOUNTER — Other Ambulatory Visit: Payer: Self-pay | Admitting: Neurological Surgery

## 2020-12-30 ENCOUNTER — Other Ambulatory Visit (HOSPITAL_COMMUNITY): Payer: Self-pay

## 2021-01-02 ENCOUNTER — Encounter: Payer: Self-pay | Admitting: Student in an Organized Health Care Education/Training Program

## 2021-01-02 ENCOUNTER — Encounter (INDEPENDENT_AMBULATORY_CARE_PROVIDER_SITE_OTHER): Payer: Medicaid Other | Admitting: Ophthalmology

## 2021-01-02 ENCOUNTER — Other Ambulatory Visit: Payer: Self-pay

## 2021-01-02 DIAGNOSIS — E113391 Type 2 diabetes mellitus with moderate nonproliferative diabetic retinopathy without macular edema, right eye: Secondary | ICD-10-CM

## 2021-01-02 DIAGNOSIS — E113292 Type 2 diabetes mellitus with mild nonproliferative diabetic retinopathy without macular edema, left eye: Secondary | ICD-10-CM | POA: Diagnosis not present

## 2021-01-02 DIAGNOSIS — H353132 Nonexudative age-related macular degeneration, bilateral, intermediate dry stage: Secondary | ICD-10-CM

## 2021-01-02 DIAGNOSIS — H35033 Hypertensive retinopathy, bilateral: Secondary | ICD-10-CM

## 2021-01-02 DIAGNOSIS — H35712 Central serous chorioretinopathy, left eye: Secondary | ICD-10-CM

## 2021-01-02 DIAGNOSIS — H43813 Vitreous degeneration, bilateral: Secondary | ICD-10-CM

## 2021-01-02 DIAGNOSIS — I10 Essential (primary) hypertension: Secondary | ICD-10-CM

## 2021-01-03 ENCOUNTER — Encounter: Payer: Self-pay | Admitting: Student in an Organized Health Care Education/Training Program

## 2021-01-03 ENCOUNTER — Ambulatory Visit
Payer: Medicaid Other | Attending: Student in an Organized Health Care Education/Training Program | Admitting: Student in an Organized Health Care Education/Training Program

## 2021-01-03 DIAGNOSIS — G8929 Other chronic pain: Secondary | ICD-10-CM

## 2021-01-03 DIAGNOSIS — G894 Chronic pain syndrome: Secondary | ICD-10-CM

## 2021-01-03 DIAGNOSIS — M5416 Radiculopathy, lumbar region: Secondary | ICD-10-CM | POA: Diagnosis not present

## 2021-01-03 NOTE — Progress Notes (Signed)
Patient: Gerald Olson  Service Category: E/M  Provider: Gillis Santa, MD  DOB: 06/19/1966  DOS: 01/03/2021  Location: Office  MRN: [id number]  Setting: Ambulatory outpatient  Referring Provider: Sharmon Leyden*  Type: Established Patient  Specialty: Interventional Pain Management  PCP: Doreen Beam, FNP  Location: Home  Delivery: TeleHealth     Virtual Encounter - Pain Management PROVIDER NOTE: Information contained herein reflects review and annotations entered in association with encounter. Interpretation of such information and data should be left to medically-trained personnel. Information provided to patient can be located elsewhere in the medical record under "Patient Instructions". Document created using STT-dictation technology, any transcriptional errors that may result from process are unintentional.    Contact & Pharmacy Preferred: [place]: [phone] (home) Mobile: [phone] (mobile) E-mail: [email or web address] .[place] [facility] [place] [address] [place] [address] Phone: [phone] Fax: [place] [address] Phone: [phone] Fax: [phone]   Pre-screening  Mr. Attar offered "in-person" vs "virtual" encounter. He indicated preferring virtual for this encounter.   Reason COVID-19*  Social distancing based on CDC and AMA recommendations.   I contacted [name] on 01/03/2021 via video conference.      I clearly identified myself as Gillis Santa, MD. I verified that I was speaking with the correct person using two identifiers (Name: DUONG HAYDEL, and date of birth: March 27, 1966).  Consent I sought verbal advanced consent from [name] for virtual visit interactions. I informed Mr. Jarmon of possible security and privacy concerns, risks, and limitations associated with providing "not-in-person" medical evaluation and management services. I  also informed Mr. Vold of the availability of "in-person" appointments. Finally, I informed him that there would be a charge for the virtual visit and that he could be  personally, fully or partially, financially responsible for it. Mr. Becvar expressed understanding and agreed to proceed.   Historic Elements   Mr. PANAGIOTIS OELKERS is a 55 y.o. year old, male patient evaluated today after our last contact on 12/12/2020. Mr. Avetisyan  has a past medical history of ADHD (attention deficit hyperactivity disorder), Alcohol use disorder, Alcoholic cirrhosis of liver, Allergy, Calculus of gallbladder without cholecystitis without obstruction, Chronic pain, Degenerative disc disease, lumbar, Diabetic polyneuropathy, Essential hypertension, benign, Generalized anxiety disorder, Glaucoma, Headache, History of concussion (2006), Major depressive disorder, Other spondylosis with radiculopathy, cervical region, Retinopathy, and Type 2 diabetes mellitus with hyperglycemia. He also  has a past surgical history that includes Knee arthroscopy and Cholecystectomy (N/A, 06/02/2018). Mr. Bunyard has a current medication list which includes the following prescription(s): accu-chek fastclix lancets, alpha-lipoic acid, accu-chek guide me, accu-chek guide, calcium-vitamin d, glucose blood, imatinib, lantus solostar, insulin pen needle, lisinopril, metformin, multivitamin with minerals, nutritional supplements, OVER THE COUNTER MEDICATION, topiramate, tramadol, zinc gluconate, insulin glargine, tramadol, and [DISCONTINUED] duloxetine. He  reports that he quit smoking about 17 years ago. He has never used smokeless tobacco. He reports current alcohol use of about 14.0 - 21.0 standard drinks of alcohol per week. He reports current drug use. Drug: Marijuana. Mr. Hartland is allergic to hydrocodone and oxycontin [oxycodone hcl].   HPI  Today, he is being contacted for a post-procedure assessment.   Post-Procedure Evaluation  Procedure  (12/12/2020):  Type: Therapeutic Inter-Laminar Epidural Steroid Injection  #1  Region: Lumbar Level: L4-5 Level. Laterality: Left-Sided   Sedation: Please see nurses note.  Effectiveness during initial hour after procedure(Ultra-Short Term Relief):  100 %   Local anesthetic used: Long-acting (4-6 hours) Effectiveness: Defined as any analgesic benefit obtained secondary to the administration of local anesthetics. This carries significant diagnostic value as to the etiological location, or anatomical origin, of the pain. Duration of benefit is expected to coincide with the duration of the local anesthetic used.  Effectiveness during initial 4-6 hours after procedure(Short-Term Relief): 100 %   Long-term benefit: Defined as any relief past the pharmacologic duration of the local anesthetics.  Effectiveness past the initial 6 hours after procedure(Long-Term Relief): 50 %   Current benefits: Defined as benefit that persist at this time.   Analgesia:  <50% better Function: Back to baseline ROM: Back to baseline  Has upcoming lumbar spinal fusion L5/S1 with Dr Ellene Route.  Laboratory Chemistry Profile   Renal Lab Results  Component Value Date   BUN 12 12/07/2020   CREATININE 0.91 12/07/2020   LABCREA 219.8 05/23/2020   BCR 8 (L) 09/14/2020   GFRAA 90 09/14/2020   GFRNONAA >60 12/07/2020     Hepatic Lab Results  Component Value Date   AST 41 12/07/2020   ALT 29 12/07/2020   ALBUMIN 3.3 (L) 12/07/2020   ALKPHOS 74 12/07/2020   LIPASE 113 (H) 10/01/2019     Electrolytes Lab Results  Component Value Date   NA 137 12/07/2020   K 4.1 12/07/2020   CL 107 12/07/2020   CALCIUM 8.2 (L) 12/07/2020   MG 1.9 12/07/2020   PHOS 3.1 12/07/2020     Bone Lab Results  Component Value Date   VD25OH 114.0 (H) 10/08/2019     Inflammation (CRP: Acute Phase) (ESR: Chronic Phase) Lab Results  Component Value Date   ESRSEDRATE 14 10/08/2019       Note: Above Lab results  reviewed.   Assessment  The primary encounter diagnosis was Chronic radicular lumbar pain. A diagnosis of Chronic pain syndrome was also pertinent to this visit.  Plan of Care   Mr. MICKAEL MCNUTT has a current medication list which includes the following long-term medication(s): calcium-vitamin d, lantus solostar, insulin glargine, lisinopril, metformin, topiramate, and [DISCONTINUED] duloxetine.  Patient received significant benefit for a short period of time after his left L4-L5 ESI on 12/12/2020.  He states that the intensity of his pain decreased after his injection however he did have more frequent painful episodes during the day however they did not last as long.  He has met with Dr. Ellene Route and they are planning for a L5-S1 lumbar spinal fusion to decompress L5 nerve.  I wish him the best with his surgical decompression.  Follow-up as needed.  Follow-up plan:   Return if symptoms worsen or fail to improve.     Left L4-L5 ESI #1 12/12/2020    Recent Visits Date Type Provider Dept  12/12/20 Procedure visit Gillis Santa, MD [facility]  10/26/20 Office Visit Gillis Santa, MD [facility]  Showing recent visits within past 90 days and meeting all other requirements Future Appointments No visits were found meeting these conditions. Showing future appointments within next 90 days and meeting all other requirements  I discussed the assessment and treatment plan with the patient. The patient was provided an opportunity to ask questions and all were answered. The patient agreed with the plan and demonstrated an understanding of the instructions.  Patient advised to call back or seek an in-person evaluation if the symptoms or condition worsens.  Duration of encounter: 61mnutes.  Note by: BGillis Santa MD Date: 01/03/2021; Time:  1:19 PM

## 2021-01-04 ENCOUNTER — Telehealth: Payer: Self-pay | Admitting: Orthopaedic Surgery

## 2021-01-04 NOTE — Telephone Encounter (Signed)
Received call from [facility] w/ [employer] regarding request for records. I advised a request was scanned in chart 2/10 from 2/9 to [phone] isn't our office and we did not get it. Advised I can process it since address to [facility], she stated it's for records 2021-present. I faxed [phone]

## 2021-01-09 ENCOUNTER — Other Ambulatory Visit: Payer: Self-pay

## 2021-01-09 MED FILL — Insulin Pen Needle 32 G X 4 MM (1/6" or 5/32"): 34 days supply | Qty: 100 | Fill #0 | Status: AC

## 2021-01-09 MED FILL — Insulin Glargine Soln Pen-Injector 100 Unit/ML: SUBCUTANEOUS | 83 days supply | Qty: 15 | Fill #0 | Status: AC

## 2021-01-12 ENCOUNTER — Other Ambulatory Visit: Payer: Self-pay

## 2021-01-17 NOTE — Progress Notes (Signed)
Surgical Instructions    Your procedure is scheduled on 01/24/21.  Report to [facility] Main Entrance "A" at 09:50 A.M., then check in with the Admitting office.  Call this number if you have problems the morning of surgery:  [phone]   If you have any questions prior to your surgery date call [phone]: Open Monday-Friday 8am-4pm    Remember:  Do not eat or drink after midnight the night before your surgery     Take these medicines the morning of surgery with A SIP OF WATER  imatinib (GLEEVEC) topiramate (TOPAMAX)  traMADol (ULTRAM) if needed   As of today, STOP taking any Aspirin (unless otherwise instructed by your surgeon) Aleve, Naproxen, Ibuprofen, Motrin, Advil, Goody's, BC's, all herbal medications, fish oil, and all vitamins.   HOW TO MANAGE YOUR DIABETES BEFORE AND AFTER SURGERY  Why is it important to control my blood sugar before and after surgery? . Improving blood sugar levels before and after surgery helps healing and can limit problems. . A way of improving blood sugar control is eating a healthy diet by: o  Eating less sugar and carbohydrates o  Increasing activity/exercise o  Talking with your doctor about reaching your blood sugar goals . High blood sugars (greater than 180 mg/dL) can raise your risk of infections and slow your recovery, so you will need to focus on controlling your diabetes during the weeks before surgery. . Make sure that the doctor who takes care of your diabetes knows about your planned surgery including the date and location.  How do I manage my blood sugar before surgery? . Check your blood sugar at least 4 times a day, starting 2 days before surgery, to make sure that the level is not too high or low. o Check your blood sugar the morning of your surgery when you wake up and every 2 hours until you get to the Short Stay unit. . If your blood sugar is less than 70 mg/dL, you will need to treat for low blood sugar: o Do not take  insulin. o Treat a low blood sugar (less than 70 mg/dL) with  cup of clear juice (cranberry or apple), 4 glucose tablets, OR glucose gel. Recheck blood sugar in 15 minutes after treatment (to make sure it is greater than 70 mg/dL). If your blood sugar is not greater than 70 mg/dL on recheck, call [phone] o  for further instructions. . Report your blood sugar to the short stay nurse when you get to Short Stay.  . If you are admitted to the hospital after surgery: o Your blood sugar will be checked by the staff and you will probably be given insulin after surgery (instead of oral diabetes medicines) to make sure you have good blood sugar levels. o The goal for blood sugar control after surgery is 80-180 mg/dL.     WHAT DO I DO ABOUT MY DIABETES MEDICATION?   Marland Kitchen Do not take : metFORMIN (GLUCOPHAGE-XR)  the morning of surgery.      . THE MORNING OF SURGERY, take 11 units of insulin glargine (LANTUS).  . The day of surgery, do not take other diabetes injectables, including Byetta (exenatide), Bydureon (exenatide ER), Victoza (liraglutide), or Trulicity (dulaglutide).                      Do not wear jewelry, make up, or nail polish            Do not wear lotions, powders, colognes,  or deodorant.            Men may shave face and neck.            Do not bring valuables to the hospital.            [facility] is not responsible for any belongings or valuables.  Do NOT Smoke (Tobacco/Vaping) or drink Alcohol 24 hours prior to your procedure If you use a CPAP at night, you may bring all equipment for your overnight stay.   Contacts, glasses, dentures or bridgework may not be worn into surgery, please bring cases for these belongings   For patients admitted to the hospital, discharge time will be determined by your treatment team.   Patients discharged the day of surgery will not be allowed to drive home, and someone needs to stay with them for 24 hours.    Special instructions:     Oral Hygiene is also important to reduce your risk of infection.  Remember - BRUSH YOUR TEETH THE MORNING OF SURGERY WITH YOUR REGULAR TOOTHPASTE   [place]- Preparing For Surgery  Before surgery, you can play an important role. Because skin is not sterile, your skin needs to be as free of germs as possible. You can reduce the number of germs on your skin by washing with CHG (chlorahexidine gluconate) Soap before surgery.  CHG is an antiseptic cleaner which kills germs and bonds with the skin to continue killing germs even after washing.     Please do not use if you have an allergy to CHG or antibacterial soaps. If your skin becomes reddened/irritated stop using the CHG.  Do not shave (including legs and underarms) for at least 48 hours prior to first CHG shower. It is OK to shave your face.  Please follow these instructions carefully.    1.  Shower the NIGHT BEFORE SURGERY and the MORNING OF SURGERY with CHG Soap.   If you chose to wash your hair, wash your hair first as usual with your normal shampoo. After you shampoo, rinse your hair and body thoroughly to remove the shampoo.  Then [employer] and genitals (private parts) with your normal soap and rinse thoroughly to remove soap.  2. After that Use CHG Soap as you would any other liquid soap. You can apply CHG directly to the skin and wash gently with a scrungie or a clean washcloth.   3. Apply the CHG Soap to your body ONLY FROM THE NECK DOWN.  Do not use on open wounds or open sores. Avoid contact with your eyes, ears, mouth and genitals (private parts). Wash Face and genitals (private parts)  with your normal soap.   4. Wash thoroughly, paying special attention to the area where your surgery will be performed.  5. Thoroughly rinse your body with warm water from the neck down.  6. DO NOT shower/wash with your normal soap after using and rinsing off the CHG Soap.  7. Pat yourself dry with a CLEAN TOWEL.  8. Wear CLEAN PAJAMAS  to bed the night before surgery  9. Place CLEAN SHEETS on your bed the night before your surgery  10. DO NOT SLEEP WITH PETS.   Day of Surgery: Take a shower with CHG soap. Wear Clean/Comfortable clothing the morning of surgery Do not apply any deodorants/lotions.   Remember to brush your teeth WITH YOUR REGULAR TOOTHPASTE.   Please read over the following fact sheets that you were given.

## 2021-01-18 ENCOUNTER — Encounter (HOSPITAL_COMMUNITY)
Admission: RE | Admit: 2021-01-18 | Discharge: 2021-01-18 | Disposition: A | Discharge status: Home / Self Care | Payer: Medicaid Other | Source: Ambulatory Visit | Attending: Neurological Surgery | Admitting: Neurological Surgery

## 2021-01-18 ENCOUNTER — Other Ambulatory Visit: Payer: Self-pay

## 2021-01-18 ENCOUNTER — Encounter (HOSPITAL_COMMUNITY): Payer: Self-pay

## 2021-01-18 DIAGNOSIS — Z01818 Encounter for other preprocedural examination: Secondary | ICD-10-CM | POA: Diagnosis not present

## 2021-01-18 HISTORY — DX: Malignant (primary) neoplasm, unspecified: C80.1

## 2021-01-18 LAB — SURGICAL PCR SCREEN
MRSA, PCR: NEGATIVE
Staphylococcus aureus: POSITIVE — AB

## 2021-01-18 LAB — TYPE AND SCREEN
ABO/RH(D): B POS
Antibody Screen: NEGATIVE

## 2021-01-18 LAB — CBC
HCT: 39.2 % (ref 39.0–52.0)
Hemoglobin: 12.6 g/dL — ABNORMAL LOW (ref 13.0–17.0)
MCH: 28.8 pg (ref 26.0–34.0)
MCHC: 32.1 g/dL (ref 30.0–36.0)
MCV: 89.5 fL (ref 80.0–100.0)
Platelets: 304 10*3/uL (ref 150–400)
RBC: 4.38 MIL/uL (ref 4.22–5.81)
RDW: 13.7 % (ref 11.5–15.5)
WBC: 8 10*3/uL (ref 4.0–10.5)
nRBC: 0 % (ref 0.0–0.2)

## 2021-01-18 LAB — COMPREHENSIVE METABOLIC PANEL
ALT: 35 U/L (ref 0–44)
AST: 45 U/L — ABNORMAL HIGH (ref 15–41)
Albumin: 3.4 g/dL — ABNORMAL LOW (ref 3.5–5.0)
Alkaline Phosphatase: 80 U/L (ref 38–126)
Anion gap: 9 (ref 5–15)
BUN: 11 mg/dL (ref 6–20)
CO2: 24 mmol/L (ref 22–32)
Calcium: 8.8 mg/dL — ABNORMAL LOW (ref 8.9–10.3)
Chloride: 104 mmol/L (ref 98–111)
Creatinine, Ser: 0.92 mg/dL (ref 0.61–1.24)
GFR, Estimated: >60 mL/min (ref >60)
Glucose, Bld: 180 mg/dL — ABNORMAL HIGH (ref 70–99)
Potassium: 4.2 mmol/L (ref 3.5–5.1)
Sodium: 137 mmol/L (ref 135–145)
Total Bilirubin: 1 mg/dL (ref 0.3–1.2)
Total Protein: 5.9 g/dL — ABNORMAL LOW (ref 6.5–8.1)

## 2021-01-18 LAB — GLUCOSE, CAPILLARY: Glucose-Capillary: 178 mg/dL — ABNORMAL HIGH (ref 70–99)

## 2021-01-18 NOTE — Progress Notes (Addendum)
PCP - Gerald Olson Cardiologist - denies Oncologist: Gerald Olson at [facility] [place]: Gerald Olson    PPM/ICD - denies   Chest x-ray - n/a EKG - 01/18/21 Stress Test - denies ECHO - denies Cardiac Cath - denies  Sleep Study - denies   Fasting Blood Sugar - 130-150 Checks Blood Sugar daily (once a day)  As of today, STOP taking any Aspirin (unless otherwise instructed by your surgeon) Aleve, Naproxen, Ibuprofen, Motrin, Advil, Goody's, BC's, all herbal medications, fish oil, and all vitamins.  ERAS Protcol -no   COVID TEST- scheduled 01/20/21 at drive thru   Anesthesia review: no  Patient denies shortness of breath, fever, cough and chest pain at PAT appointment   All instructions explained to the patient, with a verbal understanding of the material. Patient agrees to go over the instructions while at home for a better understanding. Patient also instructed to self quarantine after being tested for COVID-19. The opportunity to ask questions was provided.

## 2021-01-19 LAB — HEMOGLOBIN A1C
Hgb A1c MFr Bld: 7.2 % — ABNORMAL HIGH (ref 4.8–5.6)
Mean Plasma Glucose: 160 mg/dL

## 2021-01-19 NOTE — Progress Notes (Signed)
Dr Ellene Route notified of positive staph on MRSA swab

## 2021-01-20 ENCOUNTER — Ambulatory Visit: Payer: Medicaid Other | Admitting: Family Medicine

## 2021-01-20 ENCOUNTER — Other Ambulatory Visit (HOSPITAL_COMMUNITY)
Admission: RE | Admit: 2021-01-20 | Discharge: 2021-01-20 | Disposition: A | Discharge status: Home / Self Care | Payer: Medicaid Other | Source: Ambulatory Visit | Attending: Neurological Surgery | Admitting: Neurological Surgery

## 2021-01-20 ENCOUNTER — Other Ambulatory Visit (HOSPITAL_COMMUNITY): Payer: Self-pay

## 2021-01-20 DIAGNOSIS — Z20822 Contact with and (suspected) exposure to covid-19: Secondary | ICD-10-CM | POA: Insufficient documentation

## 2021-01-20 DIAGNOSIS — Z01812 Encounter for preprocedural laboratory examination: Secondary | ICD-10-CM | POA: Diagnosis not present

## 2021-01-21 LAB — SARS CORONAVIRUS 2 (TAT 6-24 HRS): SARS Coronavirus 2: NEGATIVE

## 2021-01-24 ENCOUNTER — Encounter (HOSPITAL_COMMUNITY): Payer: Self-pay | Admitting: Neurological Surgery

## 2021-01-24 ENCOUNTER — Inpatient Hospital Stay (HOSPITAL_COMMUNITY): Payer: Medicaid Other

## 2021-01-24 ENCOUNTER — Inpatient Hospital Stay (HOSPITAL_COMMUNITY): Payer: Medicaid Other | Admitting: Anesthesiology

## 2021-01-24 ENCOUNTER — Other Ambulatory Visit: Payer: Self-pay

## 2021-01-24 ENCOUNTER — Inpatient Hospital Stay (HOSPITAL_COMMUNITY)
Admission: RE | Admit: 2021-01-24 | Discharge: 2021-01-25 | DRG: 455 | Disposition: A | Discharge status: Home / Self Care | Payer: Medicaid Other | Attending: Neurological Surgery | Admitting: Neurological Surgery

## 2021-01-24 ENCOUNTER — Encounter (HOSPITAL_COMMUNITY)
Admission: RE | Disposition: A | Discharge status: Home / Self Care | Payer: Self-pay | Source: Home / Self Care | Attending: Neurological Surgery

## 2021-01-24 DIAGNOSIS — F419 Anxiety disorder, unspecified: Secondary | ICD-10-CM | POA: Diagnosis not present

## 2021-01-24 DIAGNOSIS — M4726 Other spondylosis with radiculopathy, lumbar region: Secondary | ICD-10-CM | POA: Diagnosis present

## 2021-01-24 DIAGNOSIS — M4317 Spondylolisthesis, lumbosacral region: Secondary | ICD-10-CM | POA: Diagnosis not present

## 2021-01-24 DIAGNOSIS — M4326 Fusion of spine, lumbar region: Secondary | ICD-10-CM | POA: Diagnosis not present

## 2021-01-24 DIAGNOSIS — Z981 Arthrodesis status: Secondary | ICD-10-CM | POA: Diagnosis not present

## 2021-01-24 DIAGNOSIS — I10 Essential (primary) hypertension: Secondary | ICD-10-CM | POA: Diagnosis present

## 2021-01-24 DIAGNOSIS — Z87891 Personal history of nicotine dependence: Secondary | ICD-10-CM | POA: Diagnosis not present

## 2021-01-24 DIAGNOSIS — M4802 Spinal stenosis, cervical region: Secondary | ICD-10-CM | POA: Diagnosis not present

## 2021-01-24 DIAGNOSIS — Z7984 Long term (current) use of oral hypoglycemic drugs: Secondary | ICD-10-CM | POA: Diagnosis not present

## 2021-01-24 DIAGNOSIS — Z794 Long term (current) use of insulin: Secondary | ICD-10-CM | POA: Diagnosis not present

## 2021-01-24 DIAGNOSIS — F32A Depression, unspecified: Secondary | ICD-10-CM | POA: Diagnosis not present

## 2021-01-24 DIAGNOSIS — M4316 Spondylolisthesis, lumbar region: Secondary | ICD-10-CM | POA: Diagnosis not present

## 2021-01-24 DIAGNOSIS — Z419 Encounter for procedure for purposes other than remedying health state, unspecified: Secondary | ICD-10-CM

## 2021-01-24 DIAGNOSIS — E1165 Type 2 diabetes mellitus with hyperglycemia: Secondary | ICD-10-CM | POA: Diagnosis not present

## 2021-01-24 DIAGNOSIS — M5417 Radiculopathy, lumbosacral region: Secondary | ICD-10-CM | POA: Diagnosis not present

## 2021-01-24 DIAGNOSIS — E119 Type 2 diabetes mellitus without complications: Secondary | ICD-10-CM | POA: Diagnosis present

## 2021-01-24 HISTORY — PX: OTHER SURGICAL HISTORY: SHX169

## 2021-01-24 LAB — ABO/RH: ABO/RH(D): B POS

## 2021-01-24 LAB — GLUCOSE, CAPILLARY
Glucose-Capillary: 124 mg/dL — ABNORMAL HIGH (ref 70–99)
Glucose-Capillary: 110 mg/dL — ABNORMAL HIGH (ref 70–99)
Glucose-Capillary: 145 mg/dL — ABNORMAL HIGH (ref 70–99)
Glucose-Capillary: 201 mg/dL — ABNORMAL HIGH (ref 70–99)

## 2021-01-24 SURGERY — POSTERIOR LUMBAR FUSION 1 LEVEL
Anesthesia: General | Site: Spine Lumbar

## 2021-01-24 MED ORDER — MEPERIDINE HCL 25 MG/ML IJ SOLN: 6.2500 mg | INTRAMUSCULAR | Status: DC | PRN

## 2021-01-24 MED ORDER — THROMBIN 5000 UNITS EX SOLR: OROMUCOSAL | Status: DC | PRN

## 2021-01-24 MED ORDER — 0.9 % SODIUM CHLORIDE (POUR BTL) OPTIME
TOPICAL | Status: DC | PRN
  Administered 2021-01-24: 1000 mL

## 2021-01-24 MED ORDER — POLYETHYLENE GLYCOL 3350 17 G PO PACK: 17.0000 g | PACK | Freq: Every day | ORAL | Status: DC | PRN

## 2021-01-24 MED ORDER — ORAL CARE MOUTH RINSE: 15.0000 mL | Freq: Once | OROMUCOSAL | Status: AC

## 2021-01-24 MED ORDER — LACTATED RINGERS IV SOLN: INTRAVENOUS | Status: DC

## 2021-01-24 MED ORDER — PHENYLEPHRINE HCL-NACL 10-0.9 MG/250ML-% IV SOLN
INTRAVENOUS | Status: DC | PRN
  Administered 2021-01-24: 15 ug/min via INTRAVENOUS

## 2021-01-24 MED ORDER — ONDANSETRON HCL 4 MG/2ML IJ SOLN: 4.0000 mg | Freq: Four times a day (QID) | INTRAMUSCULAR | Status: DC | PRN

## 2021-01-24 MED ORDER — DEXAMETHASONE SODIUM PHOSPHATE 10 MG/ML IJ SOLN
INTRAMUSCULAR | Status: AC
  Filled 2021-01-24: qty 1

## 2021-01-24 MED ORDER — INSULIN ASPART 100 UNIT/ML IJ SOLN
0.0000 [IU] | Freq: Three times a day (TID) | INTRAMUSCULAR | Status: DC
  Administered 2021-01-25: 8 [IU] via SUBCUTANEOUS

## 2021-01-24 MED ORDER — LISINOPRIL 20 MG PO TABS
20.0000 mg | ORAL_TABLET | Freq: Every day | ORAL | Status: DC
  Administered 2021-01-24: 20 mg via ORAL
  Filled 2021-01-24: qty 1

## 2021-01-24 MED ORDER — PROMETHAZINE HCL 25 MG/ML IJ SOLN: 6.2500 mg | INTRAMUSCULAR | Status: DC | PRN

## 2021-01-24 MED ORDER — ACETAMINOPHEN 160 MG/5ML PO SOLN
325.0000 mg | Freq: Once | ORAL | Status: DC | PRN
Start: 2021-01-24 — End: 2021-01-24

## 2021-01-24 MED ORDER — KETOROLAC TROMETHAMINE 15 MG/ML IJ SOLN
15.0000 mg | Freq: Four times a day (QID) | INTRAMUSCULAR | Status: DC
  Administered 2021-01-24 – 2021-01-25 (×3): 15 mg via INTRAVENOUS
  Filled 2021-01-24 (×3): qty 1

## 2021-01-24 MED ORDER — ONDANSETRON HCL 4 MG PO TABS: 4.0000 mg | ORAL_TABLET | Freq: Four times a day (QID) | ORAL | Status: DC | PRN

## 2021-01-24 MED ORDER — ACETAMINOPHEN 325 MG PO TABS: 325.0000 mg | ORAL_TABLET | Freq: Once | ORAL | Status: DC | PRN

## 2021-01-24 MED ORDER — METFORMIN HCL ER 500 MG PO TB24
1000.0000 mg | ORAL_TABLET | Freq: Two times a day (BID) | ORAL | Status: DC
  Administered 2021-01-25: 1000 mg via ORAL
  Filled 2021-01-24: qty 2

## 2021-01-24 MED ORDER — INSULIN GLARGINE 100 UNIT/ML ~~LOC~~ SOLN
22.0000 [IU] | Freq: Every morning | SUBCUTANEOUS | Status: DC
  Administered 2021-01-25: 22 [IU] via SUBCUTANEOUS
  Filled 2021-01-24: qty 0.22

## 2021-01-24 MED ORDER — ALUM & MAG HYDROXIDE-SIMETH 200-200-20 MG/5ML PO SUSP: 30.0000 mL | Freq: Four times a day (QID) | ORAL | Status: DC | PRN

## 2021-01-24 MED ORDER — ROCURONIUM BROMIDE 10 MG/ML (PF) SYRINGE
PREFILLED_SYRINGE | INTRAVENOUS | Status: AC
  Filled 2021-01-24: qty 10

## 2021-01-24 MED ORDER — METHOCARBAMOL 500 MG PO TABS
500.0000 mg | ORAL_TABLET | Freq: Four times a day (QID) | ORAL | Status: DC | PRN
  Administered 2021-01-24 – 2021-01-25 (×2): 500 mg via ORAL
  Filled 2021-01-24 (×2): qty 1

## 2021-01-24 MED ORDER — ACETAMINOPHEN 650 MG RE SUPP: 650.0000 mg | RECTAL | Status: DC | PRN

## 2021-01-24 MED ORDER — TRAMADOL HCL 50 MG PO TABS: 50.0000 mg | ORAL_TABLET | Freq: Three times a day (TID) | ORAL | Status: DC | PRN

## 2021-01-24 MED ORDER — DEXAMETHASONE SODIUM PHOSPHATE 10 MG/ML IJ SOLN
INTRAMUSCULAR | Status: DC | PRN
  Administered 2021-01-24: 5 mg via INTRAVENOUS

## 2021-01-24 MED ORDER — ONDANSETRON HCL 4 MG/2ML IJ SOLN
INTRAMUSCULAR | Status: AC
  Filled 2021-01-24: qty 2

## 2021-01-24 MED ORDER — MIDAZOLAM HCL 5 MG/5ML IJ SOLN
INTRAMUSCULAR | Status: DC | PRN
  Administered 2021-01-24: 2 mg via INTRAVENOUS

## 2021-01-24 MED ORDER — SODIUM CHLORIDE 0.9% FLUSH: 3.0000 mL | INTRAVENOUS | Status: DC | PRN

## 2021-01-24 MED ORDER — PHENOL 1.4 % MT LIQD: 1.0000 | OROMUCOSAL | Status: DC | PRN

## 2021-01-24 MED ORDER — LIDOCAINE-EPINEPHRINE 1 %-1:100000 IJ SOLN
INTRAMUSCULAR | Status: AC
  Filled 2021-01-24: qty 1

## 2021-01-24 MED ORDER — IMATINIB MESYLATE 100 MG PO TABS
400.0000 mg | ORAL_TABLET | Freq: Every day | ORAL | Status: DC
  Filled 2021-01-24 (×2): qty 4

## 2021-01-24 MED ORDER — ONDANSETRON HCL 4 MG/2ML IJ SOLN
INTRAMUSCULAR | Status: DC | PRN
  Administered 2021-01-24: 4 mg via INTRAVENOUS

## 2021-01-24 MED ORDER — METHOCARBAMOL 1000 MG/10ML IJ SOLN
500.0000 mg | Freq: Four times a day (QID) | INTRAVENOUS | Status: DC | PRN
  Filled 2021-01-24: qty 5

## 2021-01-24 MED ORDER — RISAQUAD PO CAPS
1.0000 | ORAL_CAPSULE | Freq: Every day | ORAL | Status: DC
  Filled 2021-01-24 (×2): qty 1

## 2021-01-24 MED ORDER — SENNA 8.6 MG PO TABS
1.0000 | ORAL_TABLET | Freq: Two times a day (BID) | ORAL | Status: DC
  Administered 2021-01-24: 8.6 mg via ORAL
  Filled 2021-01-24: qty 1

## 2021-01-24 MED ORDER — THROMBIN 5000 UNITS EX SOLR
CUTANEOUS | Status: AC
  Filled 2021-01-24: qty 5000

## 2021-01-24 MED ORDER — ALBUMIN HUMAN 5 % IV SOLN: INTRAVENOUS | Status: DC | PRN

## 2021-01-24 MED ORDER — THROMBIN 20000 UNITS EX SOLR: CUTANEOUS | Status: DC | PRN

## 2021-01-24 MED ORDER — CHLORHEXIDINE GLUCONATE CLOTH 2 % EX PADS: 6.0000 | MEDICATED_PAD | Freq: Once | CUTANEOUS | Status: DC

## 2021-01-24 MED ORDER — MIDAZOLAM HCL 2 MG/2ML IJ SOLN
INTRAMUSCULAR | Status: AC
  Filled 2021-01-24: qty 2

## 2021-01-24 MED ORDER — BUPIVACAINE HCL (PF) 0.5 % IJ SOLN
INTRAMUSCULAR | Status: DC | PRN
  Administered 2021-01-24: 20 mL
  Administered 2021-01-24: 5 mL

## 2021-01-24 MED ORDER — ROCURONIUM BROMIDE 10 MG/ML (PF) SYRINGE
PREFILLED_SYRINGE | INTRAVENOUS | Status: DC | PRN
  Administered 2021-01-24: 50 mg via INTRAVENOUS

## 2021-01-24 MED ORDER — ACETAMINOPHEN 10 MG/ML IV SOLN: 1000.0000 mg | Freq: Once | INTRAVENOUS | Status: DC | PRN

## 2021-01-24 MED ORDER — PROPOFOL 10 MG/ML IV BOLUS
INTRAVENOUS | Status: DC | PRN
  Administered 2021-01-24: 150 mg via INTRAVENOUS

## 2021-01-24 MED ORDER — TOPIRAMATE 25 MG PO TABS
50.0000 mg | ORAL_TABLET | Freq: Two times a day (BID) | ORAL | Status: DC
  Administered 2021-01-24: 50 mg via ORAL
  Filled 2021-01-24: qty 2

## 2021-01-24 MED ORDER — THROMBIN 20000 UNITS EX SOLR
CUTANEOUS | Status: AC
  Filled 2021-01-24: qty 20000

## 2021-01-24 MED ORDER — ACETAMINOPHEN 325 MG PO TABS: 650.0000 mg | ORAL_TABLET | ORAL | Status: DC | PRN

## 2021-01-24 MED ORDER — BISACODYL 10 MG RE SUPP: 10.0000 mg | Freq: Every day | RECTAL | Status: DC | PRN

## 2021-01-24 MED ORDER — CEFAZOLIN SODIUM-DEXTROSE 2-4 GM/100ML-% IV SOLN: 2.0000 g | INTRAVENOUS | Status: DC

## 2021-01-24 MED ORDER — CEFAZOLIN SODIUM-DEXTROSE 2-4 GM/100ML-% IV SOLN
INTRAVENOUS | Status: AC
  Filled 2021-01-24: qty 100

## 2021-01-24 MED ORDER — TRAMADOL HCL 50 MG PO TABS
50.0000 mg | ORAL_TABLET | Freq: Four times a day (QID) | ORAL | Status: DC | PRN
  Administered 2021-01-24 – 2021-01-25 (×2): 100 mg via ORAL
  Filled 2021-01-24 (×2): qty 2

## 2021-01-24 MED ORDER — FENTANYL CITRATE (PF) 250 MCG/5ML IJ SOLN
INTRAMUSCULAR | Status: AC
  Filled 2021-01-24: qty 5

## 2021-01-24 MED ORDER — AMISULPRIDE (ANTIEMETIC) 5 MG/2ML IV SOLN: 10.0000 mg | Freq: Once | INTRAVENOUS | Status: DC | PRN

## 2021-01-24 MED ORDER — CHLORHEXIDINE GLUCONATE 0.12 % MT SOLN: 15.0000 mL | Freq: Once | OROMUCOSAL | Status: AC

## 2021-01-24 MED ORDER — DOCUSATE SODIUM 100 MG PO CAPS
100.0000 mg | ORAL_CAPSULE | Freq: Two times a day (BID) | ORAL | Status: DC
  Administered 2021-01-24: 100 mg via ORAL
  Filled 2021-01-24: qty 1

## 2021-01-24 MED ORDER — BUPIVACAINE HCL (PF) 0.5 % IJ SOLN
INTRAMUSCULAR | Status: AC
  Filled 2021-01-24: qty 30

## 2021-01-24 MED ORDER — FENTANYL CITRATE (PF) 250 MCG/5ML IJ SOLN
INTRAMUSCULAR | Status: DC | PRN
  Administered 2021-01-24: 100 ug via INTRAVENOUS
  Administered 2021-01-24: 50 ug via INTRAVENOUS
  Administered 2021-01-24: 100 ug via INTRAVENOUS
  Administered 2021-01-24 (×5): 50 ug via INTRAVENOUS

## 2021-01-24 MED ORDER — HYDROMORPHONE HCL 1 MG/ML IJ SOLN
1.0000 mg | INTRAMUSCULAR | Status: DC | PRN
  Administered 2021-01-24: 1 mg via INTRAVENOUS
  Filled 2021-01-24: qty 1

## 2021-01-24 MED ORDER — FLEET ENEMA 7-19 GM/118ML RE ENEM: 1.0000 | ENEMA | Freq: Once | RECTAL | Status: DC | PRN

## 2021-01-24 MED ORDER — CHLORHEXIDINE GLUCONATE 0.12 % MT SOLN
OROMUCOSAL | Status: AC
  Administered 2021-01-24: 15 mL via OROMUCOSAL
  Filled 2021-01-24: qty 15

## 2021-01-24 MED ORDER — LIDOCAINE 2% (20 MG/ML) 5 ML SYRINGE
INTRAMUSCULAR | Status: AC
  Filled 2021-01-24: qty 5

## 2021-01-24 MED ORDER — SODIUM CHLORIDE 0.9% FLUSH
3.0000 mL | Freq: Two times a day (BID) | INTRAVENOUS | Status: DC
  Administered 2021-01-24: 3 mL via INTRAVENOUS

## 2021-01-24 MED ORDER — HYDROMORPHONE HCL 1 MG/ML IJ SOLN: 0.2500 mg | INTRAMUSCULAR | Status: DC | PRN

## 2021-01-24 MED ORDER — MENTHOL 3 MG MT LOZG: 1.0000 | LOZENGE | OROMUCOSAL | Status: DC | PRN

## 2021-01-24 MED ORDER — CEFAZOLIN SODIUM-DEXTROSE 2-4 GM/100ML-% IV SOLN
2.0000 g | Freq: Three times a day (TID) | INTRAVENOUS | Status: AC
Start: 2021-01-24 — End: 2021-01-25
  Administered 2021-01-24 – 2021-01-25 (×2): 2 g via INTRAVENOUS
  Filled 2021-01-24 (×3): qty 100

## 2021-01-24 MED ORDER — CEFAZOLIN SODIUM-DEXTROSE 2-3 GM-%(50ML) IV SOLR
INTRAVENOUS | Status: DC | PRN
  Administered 2021-01-24: 2 g via INTRAVENOUS

## 2021-01-24 MED ORDER — LIDOCAINE 2% (20 MG/ML) 5 ML SYRINGE
INTRAMUSCULAR | Status: DC | PRN
  Administered 2021-01-24: 60 mg via INTRAVENOUS

## 2021-01-24 MED ORDER — PHENYLEPHRINE 40 MCG/ML (10ML) SYRINGE FOR IV PUSH (FOR BLOOD PRESSURE SUPPORT)
PREFILLED_SYRINGE | INTRAVENOUS | Status: AC
  Filled 2021-01-24: qty 10

## 2021-01-24 MED ORDER — PROPOFOL 10 MG/ML IV BOLUS
INTRAVENOUS | Status: AC
  Filled 2021-01-24: qty 20

## 2021-01-24 MED ORDER — LIDOCAINE-EPINEPHRINE 1 %-1:100000 IJ SOLN
INTRAMUSCULAR | Status: DC | PRN
  Administered 2021-01-24: 5 mL

## 2021-01-24 SURGICAL SUPPLY — 67 items
ADH SKN CLS APL DERMABOND .7 (GAUZE/BANDAGES/DRESSINGS) ×1
APL SRG 60D 8 XTD TIP BNDBL (TIP)
BASKET BONE COLLECTION (BASKET) ×2 IMPLANT
BLADE CLIPPER SURG (BLADE) IMPLANT
BONE CANC CHIPS 20CC PCAN1/4 (Bone Implant) ×2 IMPLANT
BUR MATCHSTICK NEURO 3.0 LAGG (BURR) ×2 IMPLANT
CAGE COROENT LG 10X9X23-12 (Cage) ×4 IMPLANT
CANISTER SUCT 3000ML PPV (MISCELLANEOUS) ×2 IMPLANT
CHIPS CANC BONE 20CC PCAN1/4 (Bone Implant) ×1 IMPLANT
CNTNR URN SCR LID CUP LEK RST (MISCELLANEOUS) ×1 IMPLANT
CONT SPEC 4OZ STRL OR WHT (MISCELLANEOUS) ×2
COVER BACK TABLE 60X90IN (DRAPES) ×2 IMPLANT
COVER WAND RF STERILE (DRAPES) IMPLANT
DECANTER SPIKE VIAL GLASS SM (MISCELLANEOUS) ×1 IMPLANT
DERMABOND ADVANCED (GAUZE/BANDAGES/DRESSINGS) ×1
DERMABOND ADVANCED .7 DNX12 (GAUZE/BANDAGES/DRESSINGS) ×1 IMPLANT
DEVICE DISSECT PLASMABLAD 3.0S (MISCELLANEOUS) ×1 IMPLANT
DRAPE C-ARM 42X72 X-RAY (DRAPES) ×4 IMPLANT
DRAPE HALF SHEET 40X57 (DRAPES) IMPLANT
DRAPE LAPAROTOMY 100X72X124 (DRAPES) ×2 IMPLANT
DRSG OPSITE POSTOP 4X6 (GAUZE/BANDAGES/DRESSINGS) ×2 IMPLANT
DURAPREP 26ML APPLICATOR (WOUND CARE) ×2 IMPLANT
DURASEAL APPLICATOR TIP (TIP) IMPLANT
DURASEAL SPINE SEALANT 3ML (MISCELLANEOUS) IMPLANT
ELECT REM PT RETURN 9FT ADLT (ELECTROSURGICAL) ×2
ELECTRODE REM PT RTRN 9FT ADLT (ELECTROSURGICAL) ×1 IMPLANT
GAUZE 4X4 16PLY RFD (DISPOSABLE) IMPLANT
GAUZE SPONGE 4X4 12PLY STRL (GAUZE/BANDAGES/DRESSINGS) ×2 IMPLANT
GLOVE BIOGEL PI IND STRL 8.5 (GLOVE) ×2 IMPLANT
GLOVE BIOGEL PI INDICATOR 8.5 (GLOVE) ×2
GLOVE ECLIPSE 8.5 STRL (GLOVE) ×4 IMPLANT
GLOVE SURG POLYISO LF SZ6 (GLOVE) ×4 IMPLANT
GLOVE SURG POLYISO LF SZ7 (GLOVE) ×3 IMPLANT
GLOVE SURG UNDER POLY LF SZ6.5 (GLOVE) ×4 IMPLANT
GLOVE SURG UNDER POLY LF SZ7.5 (GLOVE) ×1 IMPLANT
GOWN STRL REUS W/ TWL LRG LVL3 (GOWN DISPOSABLE) IMPLANT
GOWN STRL REUS W/ TWL XL LVL3 (GOWN DISPOSABLE) ×1 IMPLANT
GOWN STRL REUS W/TWL 2XL LVL3 (GOWN DISPOSABLE) ×4 IMPLANT
GOWN STRL REUS W/TWL LRG LVL3 (GOWN DISPOSABLE) ×2
GOWN STRL REUS W/TWL XL LVL3 (GOWN DISPOSABLE) ×2
HEMOSTAT POWDER KIT SURGIFOAM (HEMOSTASIS) ×2 IMPLANT
KIT BASIN OR (CUSTOM PROCEDURE TRAY) ×2 IMPLANT
KIT TURNOVER KIT B (KITS) ×2 IMPLANT
MILL MEDIUM DISP (BLADE) ×2 IMPLANT
NEEDLE HYPO 22GX1.5 SAFETY (NEEDLE) ×2 IMPLANT
NS IRRIG 1000ML POUR BTL (IV SOLUTION) ×2 IMPLANT
PACK LAMINECTOMY NEURO (CUSTOM PROCEDURE TRAY) ×2 IMPLANT
PAD ARMBOARD 7.5X6 YLW CONV (MISCELLANEOUS) ×9 IMPLANT
PATTIES SURGICAL .5 X1 (DISPOSABLE) ×2 IMPLANT
PLASMABLADE 3.0S (MISCELLANEOUS) ×2
ROD RELINE-O LORD 5.5X35MM (Rod) ×2 IMPLANT
SCREW LOCK RELINE 5.5 TULIP (Screw) ×8 IMPLANT
SCREW RELINE-O POLY 6.5X40 (Screw) ×2 IMPLANT
SCREW RELINE-O POLY 6.5X45 (Screw) ×3 IMPLANT
SPONGE LAP 4X18 RFD (DISPOSABLE) IMPLANT
SPONGE SURGIFOAM ABS GEL 100 (HEMOSTASIS) ×2 IMPLANT
SUT PROLENE 6 0 BV (SUTURE) IMPLANT
SUT VIC AB 1 CT1 18XBRD ANBCTR (SUTURE) ×1 IMPLANT
SUT VIC AB 1 CT1 8-18 (SUTURE) ×2
SUT VIC AB 2-0 CP2 18 (SUTURE) ×2 IMPLANT
SUT VIC AB 3-0 SH 8-18 (SUTURE) ×2 IMPLANT
SUT VIC AB 4-0 RB1 18 (SUTURE) ×2 IMPLANT
SYR 3ML LL SCALE MARK (SYRINGE) ×10 IMPLANT
TOWEL GREEN STERILE (TOWEL DISPOSABLE) ×2 IMPLANT
TOWEL GREEN STERILE FF (TOWEL DISPOSABLE) ×2 IMPLANT
TRAY FOLEY MTR SLVR 16FR STAT (SET/KITS/TRAYS/PACK) ×2 IMPLANT
WATER STERILE IRR 1000ML POUR (IV SOLUTION) ×2 IMPLANT

## 2021-01-24 NOTE — H&P (Signed)
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  HISTORY OF PRESENT ILLNESS: Gerald Olson is a 55 year old individual who tells me that he has had significant back problems for a number of years and about 4 years ago, he notes that he developed a severe problem with the weakness in his left lower extremity.  This occurred after doing some work around the house and he was bed ridden for the initial several weeks, but then gradually he could not return to normal function because his back had persistent pain and weakness in his lower extremities.  This has limited his activities for a prolonged period of time.  He has been seen by some other specialists and had workup including an MRI performed in March of this year, which demonstrates that the patient has a grade 1 spondylolisthesis at L5-S1 with severe biforaminal narrowing for the L5 nerve roots and severe narrowing in the subarticular zone for the S1 nerve roots also.  He has marketed facet hypertrophy with severely disfigured facets, in addition to the disc degeneration at the L5-S1 level.  He also has some very modest disc degenerative changes at L4-5 and L3-4 above this.  In 2019, he had plain x-rays of the lumbar spine, which demonstrate the spondylolisthesis at the level of L5-S1.  He has been through some programs of conservative therapy, but because his problems have been persisting and the leg pain has been worsening.  He is seen now for a surgical opinion.  He also had some workup of his neck where he had some neck and shoulder problems on the left side.  An MRI of his neck performed in 2021 demonstrates that the patient has spondylosis at C3-4, C4-5, C5-6 and C6-7.  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  Subsequent MRIs do not demonstrate that area of myelomalacia, but he does have profound multilevel spondylosis of the cervical spine.  He notes that from a functional aspect, his back and legs are what bother him worse.  PHYSICAL EXAMINATION: I note that [name] demonstrates that he has some limitation in range of motion of his neck with significant gravel like feeling when he moves his neck left to right and up and down.  Has no tenderness in the supraclavicular fossa and his deltoid strength appears modestly weakened on the left side at 4/5.  Biceps strength is slightly decreased on the left side at 4/5, as is the wrist extensor, grip and intrinsic strength on the left side.  Triceps strength appears intact bilaterally.  Right-sided strength is normal.  In evaluating his gait, he does stand straight and erect, but he has difficulty with a tandem gait and difficulty with walking onto his toes or his heels.  Particularly, I noted that the right lower extremity has weakness in the tibialis anterior that would be graded 4-/5 and the gastrocs strength to be graded 4/5.  He has absent reflexes in the upper and the lower extremities.  IMPRESSION: The patient has significant cervical spondylosis and lumbar spondylosis with a spondylolisthesis at the L5-S1 level.  Given the fact that his back and his legs are  giving him the worst difficulties, I believe that a single-level decompression of the spondylolisthesis via posterior approach would be in his best interest.  He had previously seen another specialist who suggested an anterior lumbar interbody arthrodesis, and I noted that though this can create some space for the nerves and decompress the spine, it does not address the facet hypertrophy that is present there and the severe derangement of those joints.  I believe that it would be best to approach this process  most directly to decompress the S1 nerve root posteriorly, the L5 nerve root superiorly, remove the entirety of the disc and form a fusion at the L5-S1 level with pedicle screws at L5 and S1.  The surgery itself takes about 3-1/2 hours.  Patients are usually in the hospital for only an overnight.  Thereafter, they are ambulated up and about and use an external corset for a period of about 8 weeks.  Once the initial healing is set in, they can come out of the corset and can start to mobilize their backs more freely and work on rehabilitating any losses and function that they may have from weakness in their legs.

## 2021-01-24 NOTE — Progress Notes (Signed)
Orthopedic Tech Progress Note Patient Details:  SALAM MICUCCI Sep 25, 1965 [id number] Dropped off in room. Ortho Devices Type of Ortho Device: Lumbar corsett Ortho Device/Splint Location: Back Ortho Device/Splint Interventions: Ordered   Post Interventions Patient Tolerated: Other (comment) Instructions Provided: Other (comment)   Ellouise Newer 01/24/2021, 7:02 PM

## 2021-01-24 NOTE — Anesthesia Preprocedure Evaluation (Addendum)
Anesthesia Evaluation  Patient identified by MRN, date of birth, ID band Patient awake    Reviewed: Allergy & Precautions, NPO status , Patient's Chart, lab work & pertinent test results  Airway Mallampati: II  TM Distance: >3 FB Neck ROM: Full    Dental  (+) Teeth Intact, Dental Advisory Given   Pulmonary Patient abstained from smoking., former smoker,    breath sounds clear to auscultation       Cardiovascular hypertension, Pt. on medications  Rhythm:Regular Rate:Normal     Neuro/Psych  Headaches, PSYCHIATRIC DISORDERS Anxiety Depression    GI/Hepatic negative GI ROS, Neg liver ROS,   Endo/Other  diabetes, Type 2, Insulin Dependent, Oral Hypoglycemic Agents  Renal/GU negative Renal ROS     Musculoskeletal  (+) Arthritis ,   Abdominal Normal abdominal exam  (+)   Peds  Hematology negative hematology ROS (+)   Anesthesia Other Findings   Reproductive/Obstetrics                            Anesthesia Physical Anesthesia Plan  ASA: II  Anesthesia Plan: General   Post-op Pain Management:    Induction: Intravenous  PONV Risk Score and Plan: 3 and Ondansetron, Dexamethasone and Midazolam  Airway Management Planned: Oral ETT  Additional Equipment: None  Intra-op Plan:   Post-operative Plan: Extubation in OR  Informed Consent: I have reviewed the patients History and Physical, chart, labs and discussed the procedure including the risks, benefits and alternatives for the proposed anesthesia with the patient or authorized representative who has indicated his/her understanding and acceptance.     Dental advisory given  Plan Discussed with: CRNA  Anesthesia Plan Comments:        Anesthesia Quick Evaluation

## 2021-01-24 NOTE — Anesthesia Procedure Notes (Signed)
Procedure Name: Intubation Date/Time: 01/24/2021 2:11 PM Performed by: Clearnce Sorrel, CRNA Pre-anesthesia Checklist: Patient identified, Emergency Drugs available, Suction available, Patient being monitored and Timeout performed Patient Re-evaluated:Patient Re-evaluated prior to induction Oxygen Delivery Method: Circle system utilized Preoxygenation: Pre-oxygenation with 100% oxygen Induction Type: IV induction Ventilation: Mask ventilation without difficulty Laryngoscope Size: Mac and 4 Grade View: Grade I Tube type: Oral Tube size: 7.5 mm Number of attempts: 1 Airway Equipment and Method: Stylet Placement Confirmation: ETT inserted through vocal cords under direct vision,  positive ETCO2 and breath sounds checked- equal and bilateral Secured at: 24 cm Tube secured with: Tape Dental Injury: Teeth and Oropharynx as per pre-operative assessment

## 2021-01-24 NOTE — Transfer of Care (Signed)
Immediate Anesthesia Transfer of Care Note  Patient: Gerald Olson  Procedure(s) Performed: Lumbar five- Sacral one Posteror lumbar interbody fusion (N/A Spine Lumbar)  Patient Location: PACU  Anesthesia Type:General  Level of Consciousness: drowsy and patient cooperative  Airway & Oxygen Therapy: Patient Spontanous Breathing and Patient connected to nasal cannula oxygen  Post-op Assessment: Report given to RN, Post -op Vital signs reviewed and stable and Patient moving all extremities X 4  Post vital signs: Reviewed and stable  Last Vitals:  Vitals Value Taken Time  BP 119/71 01/24/21 1725  Temp    Pulse 88 01/24/21 1729  Resp 7 01/24/21 1729  SpO2 79 % 01/24/21 1729  Vitals shown include unvalidated device data.  Last Pain:  Vitals:   01/24/21 1022  TempSrc:   PainSc: 9       Patients Stated Pain Goal: 3 (51/88/41 [id number])  Complications: No complications documented.

## 2021-01-24 NOTE — Op Note (Signed)
Date of surgery: 01/24/2021 Preoperative diagnosis: Spondylolisthesis L5-S1 with L5 and S1 radiculopathies Postoperative diagnosis: Same Procedure: Bilateral laminectomy L5 and S1 with decompression involving more work than simple preparation for interbody technique.  Posterior lumbar interbody arthrodesis with peek spacers L5-S1 local autograft and allograft L5-S1.  Pedicle screw fixation L5-S1 with posterior lateral arthrodesis using local autograft and allograft L5-S1. Surgeon: Kristeen Miss First Assistant: Elwin Sleight, DO Anesthesia: General endotracheal Indications: Gerald Olson is a 55 year old individual who has had significant back and bilateral leg pain but worse on the left and on the right he has had a spondylolisthesis that has been progressively worsening over a number of years.  Is been advised regarding surgical decompression arthrodesis at L5-S1.  Procedure: Patient was brought to the operating room supine on the stretcher.  After the smooth induction of general endotracheal anesthesia he was placed prone onto the operating table.  The back was prepped with alcohol DuraPrep and draped in a sterile fashion.  Midline incision was created and carried down to the lumbodorsal fascia.  Midline of the fascia was opened on either side to expose the spinous processes of what was identified as L5 and the S1 space.  Then a large laminotomy was created removing the inferior margin lamina of L5 out to and including the entirety of the facet at L5 and S1 the facets were remarkable Billy overgrown.  And decompressing this allowed for good decompression of the common dural tube and the L5 nerve root superiorly.  Inferiorly the S1 nerve root was identified traversing through the epidural space.  It was gently mobilized medially.  The disc space itself was markedly degenerated.  The disc space was then entered and after an initial opening and this space with a 15 blade combination of curettes and rongeurs was  used to evacuate a substantial quantity of severely degenerated desiccated disc material.  At complete discectomy was performed on both sides and care was taken to protect the L5 nerve root superiorly as well as the common dural tube and the S1 nerve root inferiorly.  Once the discectomy was completed the endplates were curettaged to make sure that all endplate material was removed and then the interspace was sized for an appropriate size spacer it is felt that a 10 mm x 23 mm spacer with 12 degrees lordosis would fit best into this interval.  This was then packed with autograft and allograft and a total of 16 cc of autograft and allograft material was packed into the disc space at L5-S1 along with the 2 cages.  Pedicle entry sites were then chosen at L5 and S1 and on the right side a 6.5 x 40 mm screw was placed in the sacrum on the left side a 6.5 x 45 mm screws placed in the sacrum 6.5 x 45 mm screws were placed in L5.  Short 35 mm precontoured rod was used to connect the pedicle screws but prior to this in the previously decorticated and packed away lateral recesses between the L5 transverse process and the sacral ala was packed an additional 6 cc of autograft and allograft.  With the screws in place the rods were tightened to the recalculated torque value and then final radiographs were obtained in AP and lateral projection.  Care was taken to make sure that the L5 and S1 nerve roots remained and were well decompressed and once hemostasis was achieved in the soft tissues the lumbodorsal fascia was closed with #1 Vicryl in interrupted fashion 2-0 Vicryl  was used in subcutaneous tissues 3-0 Vicryl subcuticularly and Dermabond was placed on the skin.  Blood loss was estimated 300 cc.  Patient was returned to recovery room in stable condition.

## 2021-01-25 ENCOUNTER — Other Ambulatory Visit: Payer: Self-pay

## 2021-01-25 LAB — BASIC METABOLIC PANEL
Anion gap: 7 (ref 5–15)
BUN: 9 mg/dL (ref 6–20)
CO2: 25 mmol/L (ref 22–32)
Calcium: 8.4 mg/dL — ABNORMAL LOW (ref 8.9–10.3)
Chloride: 103 mmol/L (ref 98–111)
Creatinine, Ser: 0.84 mg/dL (ref 0.61–1.24)
GFR, Estimated: >60 mL/min (ref >60)
Glucose, Bld: 204 mg/dL — ABNORMAL HIGH (ref 70–99)
Potassium: 4.2 mmol/L (ref 3.5–5.1)
Sodium: 135 mmol/L (ref 135–145)

## 2021-01-25 LAB — CBC
HCT: 31.3 % — ABNORMAL LOW (ref 39.0–52.0)
Hemoglobin: 10.1 g/dL — ABNORMAL LOW (ref 13.0–17.0)
MCH: 28.8 pg (ref 26.0–34.0)
MCHC: 32.3 g/dL (ref 30.0–36.0)
MCV: 89.2 fL (ref 80.0–100.0)
Platelets: 212 10*3/uL (ref 150–400)
RBC: 3.51 MIL/uL — ABNORMAL LOW (ref 4.22–5.81)
RDW: 13.5 % (ref 11.5–15.5)
WBC: 7.9 10*3/uL (ref 4.0–10.5)
nRBC: 0 % (ref 0.0–0.2)

## 2021-01-25 LAB — GLUCOSE, CAPILLARY: Glucose-Capillary: 279 mg/dL — ABNORMAL HIGH (ref 70–99)

## 2021-01-25 MED ORDER — OXYCODONE-ACETAMINOPHEN 5-325 MG PO TABS
1.0000 | ORAL_TABLET | Freq: Four times a day (QID) | ORAL | 0 refills | Status: DC | PRN
  Filled 2021-01-25: qty 60, 8d supply, fill #0

## 2021-01-25 MED ORDER — METHOCARBAMOL 500 MG PO TABS
500.0000 mg | ORAL_TABLET | Freq: Four times a day (QID) | ORAL | 3 refills | Status: DC | PRN
  Filled 2021-01-25: qty 40, 10d supply, fill #0
  Filled 2021-04-17: qty 40, 10d supply, fill #1

## 2021-01-25 NOTE — Anesthesia Postprocedure Evaluation (Signed)
Anesthesia Post Note  Patient: Gerald Olson  Procedure(s) Performed: Lumbar five- Sacral one Posteror lumbar interbody fusion (N/A Spine Lumbar)     Patient location during evaluation: Other Anesthesia Type: General Level of consciousness: awake and alert Pain management: pain level controlled Vital Signs Assessment: post-procedure vital signs reviewed and stable Respiratory status: spontaneous breathing, nonlabored ventilation, respiratory function stable and patient connected to nasal cannula oxygen Cardiovascular status: blood pressure returned to baseline and stable Postop Assessment: no apparent nausea or vomiting Anesthetic complications: no   No complications documented.  Last Vitals:  Vitals:   01/25/21 0405 01/25/21 0719  BP: 135/78 132/63  Pulse: 100 84  Resp: 20 19  Temp: 37 C 36.8 C  SpO2: 96% 98%    Last Pain:  Vitals:   01/25/21 0719  TempSrc: Oral  PainSc:                  Effie Berkshire

## 2021-01-25 NOTE — Discharge Instructions (Signed)
Wound Care Leave incision open to air. You may shower. Do not scrub directly on incision.  Do not put any creams, lotions, or ointments on incision. Activity Walk each and every day, increasing distance each day. No lifting greater than 5 lbs.  Avoid bending, arching, and twisting. No driving for 2 weeks; may ride as a passenger locally. If provided with back brace, wear when out of bed.  It is not necessary to wear in bed. Diet Resume your normal diet.  Return to Work Will be discussed at you follow up appointment. Call Your Doctor If Any of These Occur Redness, drainage, or swelling at the wound.  Temperature greater than 101 degrees. Severe pain not relieved by pain medication. Incision starts to come apart. Follow Up Appt Call today for appointment in 2-3 weeks ([phone]) or for problems.  If you have any hardware placed in your spine, you will need an x-ray before your appointment. 

## 2021-01-25 NOTE — Plan of Care (Signed)
Adequately ready for discharge 

## 2021-01-25 NOTE — Evaluation (Signed)
Physical Therapy Evaluation and Discharge Patient Details Name: Gerald Olson MRN: [id number] DOB: [birth year] Today's Date: 01/25/2021   History of Present Illness  Pt is a 55 y/o male who presents s/p L5-S1 PLIF on 01/24/2021. PMH significant for DM II, HTN, diabetic polyneuropathy, CA, cirrhosis of the liver, ADHD.    Clinical Impression  Patient evaluated by Physical Therapy with no further acute PT needs identified. All education has been completed and the patient has no further questions. Pt was able to demonstrate transfers and ambulation with gross modified independence and occasional supervision for safety with [facility] for support. Pt has a series of pulleys throughout his home that he states he has been using for support as he cannot manage a RW through his home. Pt unwilling to change his routine despite safety concerns with some of his set-up. Pt was educated on precautions, brace application/wearing schedule, appropriate activity progression, and car transfer. See below for any follow-up Physical Therapy or equipment needs. PT is signing off. Thank you for this referral.     Follow Up Recommendations No PT follow up;Supervision for mobility/OOB    Equipment Recommendations  None recommended by PT    Recommendations for Other Services       Precautions / Restrictions Precautions Precautions: Back Precaution Booklet Issued: Yes (comment) Required Braces or Orthoses: Spinal Brace Spinal Brace: Lumbar corset Restrictions Weight Bearing Restrictions: No      Mobility  Bed Mobility Overal bed mobility: Needs Assistance Bed Mobility: Sidelying to Sit;Sit to Sidelying   Sidelying to sit: Modified independent (Device/Increase time)     Sit to sidelying: Modified independent (Device/Increase time) General bed mobility comments: No assist required, however frequent cues provided for optimal log roll technique.    Transfers Overall transfer level: Modified independent Equipment  used: Straight cane             General transfer comment: Cues for optimal posture with power-up to full stand. Pt using cane in a tripod position to lower himself down.  Ambulation/Gait Ambulation/Gait assistance: Supervision Gait Distance (Feet): 400 Feet Assistive device: Straight cane Gait Pattern/deviations: Step-through pattern;Decreased stride length;Trunk flexed Gait velocity: Decreased Gait velocity interpretation: 1.31 - 2.62 ft/sec, indicative of limited community ambulator General Gait Details: VC's for improved posture. Pt required frequent cues for maintenance of precautions as well as pt quick to bend and twist.  Stairs Stairs:  (Pt declined stairs)          Wheelchair Mobility    Modified Rankin (Stroke Patients Only)       Balance Overall balance assessment: Mild deficits observed, not formally tested                                           Pertinent Vitals/Pain Pain Assessment: Faces Pain Score: 6  Faces Pain Scale: Hurts even more Pain Location: incisional Pain Descriptors / Indicators: Aching;Burning;Grimacing;Guarding Pain Intervention(s): Limited activity within patient's tolerance;Monitored during session;Repositioned    Home Living Family/patient expects to be discharged to:: Private residence Living Arrangements: Alone Available Help at Discharge: Family;Friend(s);Available PRN/intermittently Type of Home: House Home Access: Stairs to enter Entrance Stairs-Rails: Can reach both Entrance Stairs-Number of Steps: 2 Home Layout: One level Home Equipment: Cane - single point;Adaptive equipment      Prior Function Level of Independence: Independent with assistive device(s)         Comments: uses pieces of the  hip kit for ADL and a cane for mobility. Reports having a "pulley system" in the house to maneuver around.     Hand Dominance   Dominant Hand: Right    Extremity/Trunk Assessment   Upper Extremity  Assessment Upper Extremity Assessment: Defer to OT evaluation    Lower Extremity Assessment Lower Extremity Assessment: Generalized weakness    Cervical / Trunk Assessment Cervical / Trunk Assessment: Other exceptions Cervical / Trunk Exceptions: s/p surgery  Communication   Communication: No difficulties  Cognition Arousal/Alertness: Awake/alert Behavior During Therapy: WFL for tasks assessed/performed Overall Cognitive Status: Within Functional Limits for tasks assessed                                        General Comments      Exercises     Assessment/Plan    PT Assessment Patent does not need any further PT services  PT Problem List         PT Treatment Interventions      PT Goals (Current goals can be found in the Care Plan section)  Acute Rehab PT Goals Patient Stated Goal: return home to recover PT Goal Formulation: All assessment and education complete, DC therapy    Frequency     Barriers to discharge Decreased caregiver support Pt will have intermittent support    Co-evaluation               AM-PAC PT "6 Clicks" Mobility  Outcome Measure Help needed turning from your back to your side while in a flat bed without using bedrails?: None Help needed moving from lying on your back to sitting on the side of a flat bed without using bedrails?: None Help needed moving to and from a bed to a chair (including a wheelchair)?: None Help needed standing up from a chair using your arms (e.g., wheelchair or bedside chair)?: None Help needed to walk in hospital room?: A Little Help needed climbing 3-5 steps with a railing? : A Little 6 Click Score: 22    End of Session Equipment Utilized During Treatment: Back brace Activity Tolerance: Patient tolerated treatment well Patient left: with call bell/phone within reach (Sitting EOB) Nurse Communication: Mobility status PT Visit Diagnosis: Unsteadiness on feet (R26.81);Pain Pain - part of  body:  (back)    Time: 0922-0959 PT Time Calculation (min) (ACUTE ONLY): 37 min   Charges:   PT Evaluation $PT Eval Low Complexity: 1 Low PT Treatments $Gait Training: 8-22 mins        Rolinda Roan, PT, DPT Acute Rehabilitation Services Pager: [phone] Office: [phone]   Thelma Comp 01/25/2021, 11:41 AM

## 2021-01-25 NOTE — Progress Notes (Signed)
Patient alert and oriented, voiding adequately, skin clean, dry and intact without evidence of skin break down, or symptoms of complications - no redness or edema noted, only slight tenderness at site.  Patient states pain is manageable at time of discharge. Patient has an appointment with MD in 3 weeks 

## 2021-01-25 NOTE — Discharge Summary (Signed)
Physician Discharge Summary  Patient ID: Gerald Olson MRN: [id number] DOB/AGE: 55-Apr-1967 26 y.o.  Admit date: 01/24/2021 Discharge date: 01/25/2021  Admission Diagnoses: Spondylolisthesis L5-S1 with lumbar radiculopathy  Discharge Diagnoses: Spondylolisthesis L5-S1 with lumbar radiculopathy lower extremity weakness. Active Problems:   Spondylolisthesis at L5-S1 level   Discharged Condition: good  Hospital Course: Patient was admitted to undergo surgical decompression and arthrodesis at L5-S1.  He tolerated surgery well.  Consults: None  Significant Diagnostic Studies: None  Treatments: surgery: See op note  Discharge Exam: Blood pressure 132/63, pulse 84, temperature 98.3 F (36.8 C), temperature source Oral, resp. rate 19, height 6' 1.5" (1.867 m), weight 105.9 kg, SpO2 98 %. Incision is clean and dry motor function is good slight weakness in the gluteus on the left side.  Disposition: Discharge disposition: 01-Home or Self Care       Discharge Instructions    Call MD for:  redness, tenderness, or signs of infection (pain, swelling, redness, odor or green/yellow discharge around incision site)   Complete by: As directed    Call MD for:  severe uncontrolled pain   Complete by: As directed    Call MD for:  temperature >100.4   Complete by: As directed    Diet - low sodium heart healthy   Complete by: As directed    Discharge wound care:   Complete by: As directed    Okay to shower. Do not apply salves or appointments to incision. No heavy lifting with the upper extremities greater than 10 pounds. May resume driving when not requiring pain medication and patient feels comfortable with doing so.   Incentive spirometry RT   Complete by: As directed    Increase activity slowly   Complete by: As directed      Allergies as of 01/25/2021      Reactions   Hydrocodone Itching   Itching, face tingling, dizziness.   Oxycontin [oxycodone Hcl] Itching      Medication List     TAKE these medications   Accu-Chek FastClix Lancets Misc USE AS DIRECTED 2 TIMES DAILY   Accu-Chek Guide Me w/Device Kit 1 kit by Does not apply route 3 (three) times daily.   Accu-Chek Guide w/Device Kit USE AS DIRECTED   Accu-Chek Guide test strip Generic drug: glucose blood USE AS DIRECTED 2 TIMES DAILY   acidophilus Caps capsule Take 1 capsule by mouth daily.   Alpha-Lipoic Acid 600 MG Caps Take 1,200 mg by mouth in the morning.   GINSENG-RED CHINESE PO Take 4,000 mg by mouth daily.   imatinib 400 MG tablet Commonly known as: GLEEVEC Take 1 tablet (400 mg total) by mouth daily.   Lantus SoloStar 100 UNIT/ML Solostar Pen Generic drug: insulin glargine INJECT 18 UNITS INTO THE SKIN EVERY MORNING. What changed:   how much to take  how to take this  when to take this   Lantus SoloStar 100 UNIT/ML Solostar Pen Generic drug: insulin glargine Inject 22 Units into the skin every morning. And pen needles 1/day What changed: Another medication with the same name was changed. Make sure you understand how and when to take each.   lisinopril 20 MG tablet Commonly known as: ZESTRIL TAKE 1 TABLET (20 MG TOTAL) BY MOUTH DAILY. What changed: how much to take   metFORMIN 500 MG 24 hr tablet Commonly known as: GLUCOPHAGE-XR TAKE 2 TABLETS (1,000 MG TOTAL) BY MOUTH 2 (TWO) TIMES DAILY WITH A MEAL. What changed: how much to take   methocarbamol  500 MG tablet Commonly known as: ROBAXIN Take 1 tablet (500 mg total) by mouth every 6 (six) hours as needed for muscle spasms.   multivitamin with minerals tablet Take 1 tablet by mouth daily.   OVER THE COUNTER MEDICATION Take 2,000 mg by mouth daily. Black Seed Oil   OVER THE COUNTER MEDICATION Take 1 capsule by mouth daily. Testosterone Support   oxyCODONE-acetaminophen 5-325 MG tablet Commonly known as: Percocet Take 1-2 tablets by mouth every 6 (six) hours as needed for moderate pain or severe pain.   topiramate  50 MG tablet Commonly known as: TOPAMAX TAKE 1 TABLET BY MOUTH IN THE MORNING AND 2 TABLETS AT BEDTIME. PLEASE CALL YOUR SPECIALIST TO SCHEDULE FOLLOW UP What changed:   how much to take  how to take this  when to take this   traMADol 50 MG tablet Commonly known as: ULTRAM TAKE 1 TABLET (50 MG TOTAL) BY MOUTH EVERY 12 (TWELVE) HOURS AS NEEDED FOR UP TO 15 DAYS FOR SEVERE PAIN. What changed: Another medication with the same name was changed. Make sure you understand how and when to take each.   traMADol 50 MG tablet Commonly known as: ULTRAM TAKE 1-2 TABLETS (50-100 MG TOTAL) BY MOUTH 3 (THREE) TIMES DAILY AS NEEDED FOR SEVERE PAIN. MAY CAUSE DROWSINESS. What changed:   how much to take  how to take this  when to take this  reasons to take this   TRUEplus Pen Needles 32G X 4 MM Misc Generic drug: Insulin Pen Needle USE TO INJECT VICTOZA ONCE DAILY.   Vitamin D 125 MCG (5000 UT) Caps Take 5,000 Units by mouth daily.            Discharge Care Instructions  (From admission, onward)         Start     Ordered   01/25/21 0000  Discharge wound care:       Comments: Okay to shower. Do not apply salves or appointments to incision. No heavy lifting with the upper extremities greater than 10 pounds. May resume driving when not requiring pain medication and patient feels comfortable with doing so.   01/25/21 0859           Signed: Blanchie Dessert Mikiah Durall 01/25/2021, 8:59 AM

## 2021-01-25 NOTE — Evaluation (Signed)
Occupational Therapy Evaluation Patient Details Name: Gerald Olson MRN: [id number] DOB: [birth year] Today's Date: 01/25/2021    History of Present Illness 55 year old significant cervical spondylosis and lumbar spondylosis with a spondylolisthesis at the L5-S1 level.  Underwent Posterior lumbar interbody arthrodesis with peek spacers L5-S1 local autograft and allograft L5-S1.  Pedicle screw fixation L5-S1.   Clinical Impression   Patient admitted for the diagnosis above.  He is functioning at his baseline.  He will have assist as needed from family and friends.  All questions answered and precautions reviewed.  No further acute OT needs.      Follow Up Recommendations  No OT follow up    Equipment Recommendations  None recommended by OT    Recommendations for Other Services       Precautions / Restrictions Precautions Precautions: Back Precaution Booklet Issued: Yes (comment) Required Braces or Orthoses: Spinal Brace Spinal Brace: Lumbar corset Restrictions Weight Bearing Restrictions: No      Mobility Bed Mobility                 Patient Response: Cooperative  Transfers Overall transfer level: Modified independent Equipment used: Straight cane                  Balance Overall balance assessment: Mild deficits observed, not formally tested                                         ADL either performed or assessed with clinical judgement   ADL Overall ADL's : At baseline                                       General ADL Comments: Able to complete ADL from a sit/stand level and using a SPC level.     Vision Baseline Vision/History: No visual deficits Patient Visual Report: No change from baseline       Perception     Praxis      Pertinent Vitals/Pain Pain Assessment: 0-10 Pain Score: 6  Pain Location: incisional Pain Descriptors / Indicators: Aching;Burning;Grimacing;Guarding Pain Intervention(s): Monitored  during session     Hand Dominance Right   Extremity/Trunk Assessment Upper Extremity Assessment Upper Extremity Assessment: Overall WFL for tasks assessed   Lower Extremity Assessment Lower Extremity Assessment: Defer to PT evaluation   Cervical / Trunk Assessment Cervical / Trunk Assessment: Normal   Communication Communication Communication: No difficulties   Cognition Arousal/Alertness: Awake/alert Behavior During Therapy: WFL for tasks assessed/performed Overall Cognitive Status: Within Functional Limits for tasks assessed                                     General Comments       Exercises     Shoulder Instructions      Home Living Family/patient expects to be discharged to:: Private residence Living Arrangements: Alone Available Help at Discharge: Family;Friend(s);Available PRN/intermittently Type of Home: House Home Access: Stairs to enter [employer] of Steps: 2 Entrance Stairs-Rails: Can reach both Home Layout: One level     Bathroom Shower/Tub: Teacher, early years/pre: Standard     Home Equipment: Cane - single point;Adaptive equipment Adaptive Equipment: Reacher;Long-handled sponge  Prior Functioning/Environment Level of Independence: Independent with assistive device(s)        Comments: uses pices of the hip kit for ADL and a cane for mobility.        OT Problem List: Impaired balance (sitting and/or standing);Pain      OT Treatment/Interventions:      OT Goals(Current goals can be found in the care plan section) Acute Rehab OT Goals Patient Stated Goal: return home to recover OT Goal Formulation: With patient Time For Goal Achievement: 01/25/21 Potential to Achieve Goals: Good  OT Frequency:     Barriers to D/C:            Co-evaluation              AM-PAC OT "6 Clicks" Daily Activity     Outcome Measure Help from another person eating meals?: None Help from another person  taking care of personal grooming?: None Help from another person toileting, which includes using toliet, bedpan, or urinal?: None Help from another person bathing (including washing, rinsing, drying)?: A Little Help from another person to put on and taking off regular upper body clothing?: None Help from another person to put on and taking off regular lower body clothing?: A Little 6 Click Score: 22   End of Session Equipment Utilized During Treatment: Back brace Nurse Communication: Mobility status  Activity Tolerance: Patient tolerated treatment well Patient left: with call bell/phone within reach;in chair  OT Visit Diagnosis: Unsteadiness on feet (R26.81);Pain Pain - part of body:  (l spine)                Time: 0518-3358 OT Time Calculation (min): 35 min Charges:  OT General Charges $OT Visit: 1 Visit OT Evaluation $OT Eval Moderate Complexity: 1 Mod OT Treatments $Self Care/Home Management : 8-22 mins  01/25/2021  Rich, OTR/L  Acute Rehabilitation Services  Office:  [phone]   Gerald Olson 01/25/2021, 8:55 AM

## 2021-01-26 ENCOUNTER — Telehealth: Payer: Self-pay | Admitting: *Deleted

## 2021-01-26 ENCOUNTER — Other Ambulatory Visit: Payer: Self-pay

## 2021-01-26 NOTE — Telephone Encounter (Signed)
Transition Care Management Follow-up Telephone Call  Date of discharge and from where: 01/25/2021 - [facility]  How have you been since you were released from the hospital? "In a little bit of pain but nothing I can't handle"  Any questions or concerns? No  Items Reviewed:  Did the pt receive and understand the discharge instructions provided? Yes   Medications obtained and verified? Yes   Other? No   Any new allergies since your discharge? No   Dietary orders reviewed? No  Do you have support at home? Yes    Functional Questionnaire: (I = Independent and D = Dependent) ADLs: I  Bathing/Dressing- I  Meal Prep- I  Eating- I  Maintaining continence- I  Transferring/Ambulation- I  Managing Meds- I  Follow up appointments reviewed:   PCP Hospital f/u appt confirmed? Yes  Scheduled to see PCP on 02/07/2021 @ 1600.  [facility] f/u appt confirmed? No    Are transportation arrangements needed? No   If their condition worsens, is the pt aware to call PCP or go to the Emergency Dept.? Yes  Was the patient provided with contact information for the PCP's office or ED? Yes  Was to pt encouraged to call back with questions or concerns? Yes

## 2021-02-05 MED FILL — Tramadol HCl Tab 50 MG: ORAL | 5 days supply | Qty: 10 | Fill #0 | Status: CN

## 2021-02-06 ENCOUNTER — Other Ambulatory Visit (HOSPITAL_COMMUNITY): Payer: Self-pay

## 2021-02-06 ENCOUNTER — Other Ambulatory Visit: Payer: Self-pay

## 2021-02-07 ENCOUNTER — Encounter (HOSPITAL_COMMUNITY): Payer: Self-pay | Admitting: Psychiatry

## 2021-02-07 ENCOUNTER — Telehealth (INDEPENDENT_AMBULATORY_CARE_PROVIDER_SITE_OTHER): Payer: Medicaid Other | Admitting: Psychiatry

## 2021-02-07 ENCOUNTER — Encounter: Payer: Self-pay | Admitting: Family Medicine

## 2021-02-07 ENCOUNTER — Ambulatory Visit: Payer: Medicaid Other | Admitting: Family Medicine

## 2021-02-07 ENCOUNTER — Other Ambulatory Visit: Payer: Self-pay

## 2021-02-07 VITALS — BP 127/89 | HR 125 | Temp 98.9°F | Resp 16 | Wt 218.8 lb

## 2021-02-07 DIAGNOSIS — F1020 Alcohol dependence, uncomplicated: Secondary | ICD-10-CM

## 2021-02-07 DIAGNOSIS — F0634 Mood disorder due to known physiological condition with mixed features: Secondary | ICD-10-CM

## 2021-02-07 DIAGNOSIS — F064 Anxiety disorder due to known physiological condition: Secondary | ICD-10-CM | POA: Diagnosis not present

## 2021-02-07 DIAGNOSIS — Z8739 Personal history of other diseases of the musculoskeletal system and connective tissue: Secondary | ICD-10-CM | POA: Diagnosis not present

## 2021-02-07 DIAGNOSIS — E1165 Type 2 diabetes mellitus with hyperglycemia: Secondary | ICD-10-CM

## 2021-02-07 DIAGNOSIS — Z981 Arthrodesis status: Secondary | ICD-10-CM

## 2021-02-07 NOTE — Progress Notes (Signed)
Established patient visit      Patient: Gerald Olson   DOB: 02/26/1966   55 y.o. Male  MRN: [id number]  Visit Date: 02/07/2021    Today's healthcare provider: Vernie Murders, PA-C     No chief complaint on file.    Subjective      HPI   Back Pain    He reports chronic back pain. There was not an injury that may have caused  the pain. The most recent episode started a few years ago and is  worsening. The pain is located in the lumbar with radiation. It is  described as dull, is severe and 9/10 in intensity, occurring constantly.  Symptoms are worse in the: morning, mid-day, afternoon, evening, nighttime    Aggravating factors: recumbency Relieving factors: none.   He has tried NSAIDs and prescription pain relievers with moderate relief.  Had L5-S1 fusion for spondylolisthesis by Dr. Ellene Route (neurosurgery) on  01-24-21. Using Tramadol 50 mg 1/2-1 tablet 1-2 times a day prn moderate  pain with Oxycodone/APAP 5/325 mg 1-2 at bedtime for moderate to severe  pain.    Associated symptoms:  No abdominal pain No bowel incontinence   No chest pain No dysuria    No fever No headaches   No joint pains No pelvic pain   Yes weakness in leg   No tingling in lower extremities   No urinary incontinence Yes weight loss       --------------------------------------------------------------------------- --------------      Patient Active Problem List    Diagnosis Date Noted    Spondylolisthesis at L5-S1 level 01/24/2021    Cervical radicular pain 10/26/2020    Lumbar facet arthropathy 10/26/2020    Lumbar degenerative disc disease 10/26/2020    Chronic pain syndrome 10/26/2020    Opiate misuse 10/26/2020    Urinary symptom or sign 09/14/2020    Fall 08/09/2020    Acute midline thoracic back pain 08/09/2020    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  08/09/2020    No-show for appointment 08/04/2020    Occult blood in stools 06/15/2020    Mesenteric  lymphadenopathy 06/14/2020    Splenomegaly- mild  06/14/2020    Screening for blood or protein in urine 06/14/2020    Elevated blood pressure reading 06/14/2020    History of cannabis abuse 06/14/2020    Recurrent major depressive disorder, in partial remission ([place])  06/09/2020    Anxiety 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 06/09/2020    Alcohol use disorder, moderate, dependence ([place]) 06/09/2020    Spinal stenosis of cervical region 06/03/2020    Frequent falls 05/23/2020    Foraminal stenosis of cervical region 05/09/2020    Diabetic polyneuropathy     ADHD (attention deficit hyperactivity disorder)     Generalized anxiety disorder     Major depressive disorder     Cervical spondylosis 09/22/2019    Low back pain 07/28/2019    Essential hypertension, benign 56/25/6389    Alcoholic cirrhosis of liver 06/02/2018    Type 2 diabetes mellitus with hyperglycemia 04/25/2018    Alcohol use disorder, mild, abuse 04/25/2018     Past Medical History:   Diagnosis Date    ADHD (attention deficit hyperactivity disorder)     Diagnosed during teenage years    Alcohol use disorder     On average 14-21 drinks per week    Alcoholic cirrhosis of liver     Per patient -  condition is not related to moderate alcohol use but rather  prolonged NSAID use and recent abuse.    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 [place]    Major depressive disorder     Other spondylosis with radiculopathy, cervical region     Retinopathy     Right eye; per patient report    Type 2 diabetes mellitus with hyperglycemia      Past Surgical  History:   Procedure Laterality Date    CHOLECYSTECTOMY N/A 06/02/2018    Procedure: LAPAROSCOPIC CHOLECYSTECTOMY;  Surgeon: Vickie Epley, MD;   Location: [facility] ORS;  Service: General;  Laterality: N/A;    KNEE ARTHROSCOPY      30 Years ago    lumbar five-sacral one posterior lumbar interbody fusion  01/24/2021     Social History     Socioeconomic History    Marital status: Single     Spouse name: Not on file    Number of children: Not on file    Years of education: 18    Highest education level: Master's degree (e.g., MA, MS, MEng, MEd, MSW,  MBA)   Occupational History    Not on file   Tobacco Use    Smoking status: Former     Pack years: 0.00     Types: Cigarettes     Quit date: 06/25/2003     Years since quitting: 17.6    Smokeless tobacco: Never    Tobacco comments:     Currently vapes   Vaping Use    Vaping Use: Every day   Substance and Sexual Activity    Alcohol use: Yes     Alcohol/week: 14.0 - 21.0 standard drinks     Types: 14 - 21 Cans of beer per week     Comment: 2-3 beers per night on average; sometimes up to 6    Drug use: Yes     Types: Marijuana     Comment: 1/week to help sleep; edibles, does not smoke    Sexual activity: Yes   Other Topics Concern    Not on file   Social History Narrative    Not on file     Social Determinants of Health     Financial Resource Strain: Not on file   Food Insecurity: Not on file   Transportation Needs: Not on file   Physical Activity: Not on file   Stress: Not on file   Social Connections: Not on file   Intimate Partner Violence: Not on file     Family History   Problem Relation Age of Onset    Aneurysm Father     Multiple sclerosis Sister     Emphysema Maternal Grandfather     Diabetes Neg Hx      Medications:  Outpatient Medications Prior to Visit   Medication Sig    Accu-Chek FastClix Lancets MISC USE AS DIRECTED 2 TIMES DAILY    acidophilus (RISAQUAD) CAPS capsule Take 1 capsule  by mouth daily.     Alpha-Lipoic Acid 600 MG CAPS Take 1,200 mg by mouth in the morning.    Blood Glucose Monitoring Suppl (ACCU-CHEK GUIDE ME) w/Device KIT 1 kit by  Does not apply route 3 (three) times daily.    Cholecalciferol (VITAMIN D) 125 MCG (5000 UT) CAPS Take 5,000 Units by  mouth daily.    glucose blood test strip USE AS DIRECTED 2 TIMES DAILY (Patient taking  differently: USE AS DIRECTED 2 TIMES DAILY)    imatinib (GLEEVEC) 400 MG tablet Take 1 tablet (400 mg total) by mouth  daily.    insulin glargine (LANTUS SOLOSTAR) 100 UNIT/ML Solostar Pen Inject 22  Units into the skin every morning. And pen needles 1/day    insulin glargine (LANTUS) 100 UNIT/ML Solostar Pen INJECT 18 UNITS INTO  THE SKIN EVERY MORNING. (Patient taking differently: Inject 22 Units into  the skin in the morning.)    Insulin Pen Needle 32G X 4 MM MISC USE TO INJECT VICTOZA ONCE DAILY.    lisinopril (ZESTRIL) 20 MG tablet TAKE 1 TABLET (20 MG TOTAL) BY MOUTH  DAILY. (Patient taking differently: Take 20 mg by mouth daily.)    metFORMIN (GLUCOPHAGE-XR) 500 MG 24 hr tablet TAKE 2 TABLETS (1,000 MG  TOTAL) BY MOUTH 2 (TWO) TIMES DAILY WITH A MEAL. (Patient taking  differently: Take 1,000 mg by mouth 2 (two) times daily with a meal.)    methocarbamol (ROBAXIN) 500 MG tablet Take 1 tablet (500 mg total) by  mouth every 6 (six) hours as needed for muscle spasms.    Misc Natural Products (GINSENG-RED CHINESE PO) Take 4,000 mg by mouth  daily.    Multiple Vitamins-Minerals (MULTIVITAMIN WITH MINERALS) tablet Take 1  tablet by mouth daily.     OVER THE COUNTER MEDICATION Take 2,000 mg by mouth daily. Black Seed Oil    OVER THE COUNTER MEDICATION Take 1 capsule by mouth daily. Testosterone  Support    topiramate (TOPAMAX) 50 MG tablet TAKE 1 TABLET BY MOUTH IN THE MORNING  AND 2 TABLETS AT BEDTIME. PLEASE CALL YOUR SPECIALIST TO SCHEDULE FOLLOW  UP (Patient not taking: Reported on 02/07/2021)    traMADol (ULTRAM) 50 MG tablet TAKE 1-2 TABLETS  (50-100 MG TOTAL) BY  MOUTH 3 (THREE) TIMES DAILY AS NEEDED FOR SEVERE PAIN. MAY CAUSE  DROWSINESS. (Patient not taking: Reported on 02/07/2021)    [DISCONTINUED] Blood Glucose Monitoring Suppl (ACCU-CHEK GUIDE) w/Device  KIT USE AS DIRECTED (Patient not taking: Reported on 02/07/2021)    [DISCONTINUED] traMADol (ULTRAM) 50 MG tablet TAKE 1 TABLET (50 MG TOTAL)  BY MOUTH EVERY 12 (TWELVE) HOURS AS NEEDED FOR UP TO 15 DAYS FOR SEVERE  PAIN. (Patient not taking: No sig reported)     No facility-administered medications prior to visit.       Review of Systems   Constitutional:  Positive for activity change. Negative for fatigue.   Respiratory:  Negative for chest tightness.    Cardiovascular:  Negative for chest pain.   Musculoskeletal:  Positive for arthralgias, back pain, gait problem and  myalgias.          Objective      BP 127/89 (BP Location: Right Arm, Patient Position: Sitting, Cuff Size:  Large)   Pulse (!) 125   Temp 98.9 F (37.2 C) (Oral)   Resp 16   Wt  218 lb 12.8 oz (99.2 kg)   SpO2 100%   BMI 28.48 kg/m   BP Readings  from Last 3 Encounters:   02/07/21 127/89   01/25/21 132/63   01/18/21 (!) 153/100     Wt Readings from Last 3 Encounters:  02/07/21  218 lb 12.8 oz (99.2 kg)  01/24/21  233 lb 7.5 oz (105.9 kg)  01/18/21  233 lb 7 oz (105.9 kg)     Physical Exam  Constitutional:       General: He is not in acute distress.     Appearance: He is well-developed.   HENT:      Head: Normocephalic and atraumatic.      Right Ear: Hearing normal.      Left Ear: Hearing normal.      Nose: Nose normal.   Eyes:      General: Lids are normal. No scleral icterus.        Right eye: No discharge.         Left eye: No discharge.      Conjunctiva/sclera: Conjunctivae normal.   Cardiovascular:      Rate and Rhythm: Tachycardia present.   Pulmonary:      Effort: Pulmonary effort is normal. No respiratory distress.   Abdominal:      General: Bowel sounds are normal.       Palpations: Abdomen is soft.   Musculoskeletal:         General: Tenderness present.      Cervical back: Neck supple.      Comments: Low back pain. Lower lumbar scar healing without signs of  infection. Stiffness and very slow to walk or move.   Skin:     Findings: No lesion or rash.   Neurological:      Mental Status: He is alert and oriented to person, place, and time.      Deep Tendon Reflexes: Reflexes abnormal.      Comments: Unable to elicit DTR's in lower extremities.   Psychiatric:         Speech: Speech normal.         Behavior: Behavior normal.         Thought Content: Thought content normal.          No results found for any visits on 02/07/21.   Assessment & Plan        1. S/P lumbar spinal fusion  Had fusion on 01-24-21 by Dr. Ellene Route (neurosurgeon). Continues to have  improvement in low back pain. Uses Oxycodone/APAP 5/325 mg 1-2 hs prn  severe pain. When pain more moderate will switch to Tramadol 50 mg 1-2  tablets 1-2 times a day. Recommend he continue back brace and follow up  with surgeon next week.    2. History of acquired spondylolisthesis  Chronic back pain from spondylolisthesis of L5-S1.    3. Type 2 diabetes mellitus with hyperglycemia  Controlled by Meformin and Insulin (Lantus). Advised to stop ETOH intake  to help maintain diabetes control. Last Hgb A1C was 7.0 on 09-05-20. Had  some elevation of glucose post operatively as expected. Recheck in 2  months.      No follow-ups on file.         I, Keeshawn Fakhouri, PA-C, have reviewed all documentation for this visit.  The documentation on 02/07/21 for the exam, diagnosis, procedures, and  orders are all accurate and complete.        Vernie Murders, PA-C   [employer]  [phone] (phone)  [phone] (fax)    [place]

## 2021-02-07 NOTE — Progress Notes (Signed)
[facility] Progress Note   Virtual Visit via Video Note  I connected with Gerald Olson on 02/07/21 at  3:00 PM EDT by a video enabled telemedicine application and verified that I am speaking with the correct person using two identifiers.  Location: Patient: Home Provider: Clinic   I discussed the limitations of evaluation and management by telemedicine and the availability of in person appointments. The patient expressed understanding and agreed to proceed.  I provided 22 minutes of non-face-to-face time during this encounter.      Patient Identification: Gerald Olson MRN:  [id number] Date of Evaluation:  02/07/2021    Chief Complaint:  " I just had my back surgery 2 weeks ago, I am in a lot of pain."   Visit Diagnosis:    ICD-10-CM   1. 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 Anxiety 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  F06.4     3. Alcohol use disorder, moderate, dependence (HCC)  F10.20       History of Present Illness: This is a 55 year old male with history of depression, anxiety, alcohol use disorder, cervical and lumbar disc disease, diabetes mellitus type 2, hypertension, alcoholic cirrhosis, concussion, retinopathy, chronic pain now seen for follow up.  Patient recently underwent lumbar-sacral interbody fusion on May 31 by neurosurgery service at [facility].  Pt described in great detail the surgical procedure he went through an amount of pain he has had to endure while recovering from it.  He rambled about everything that led to the procedure and how he is going to deal with it. He stated that his chronic mother leukemia is in remission and the oncologist told him that he could temporarily stop taking the medication.  Writer looked at the notes from the EMR and could not corroborate what he was saying.  Patient spoke about how his degree in psychology has helped him to keep going in life.  He stated that he does not  want to give up. When asked about his plans for the future he said he just could not take 1 small step at a time.  He wants to get back to walking independently first. He stated that he had a lot of grand plans for himself however because of his back issues things got sidetracked.  He stated that he even had prepared a product for shark tank.  He stated that he has an appointment in [place] today and he is about to leave for that.  He does not think he needs to start any medications to help his mood or anxiety as of now.  He wants to do with the pain first. Regarding alcohol consumption, he stated that he had some drinks here and there may need to numb his pain.  He stated that he is not mixing Percocets with alcohol.  Writer informed him that Probation officer is leaving this office and therefore his care will be transferred to another provider.  Patient stated that he would prefer to see a provider in [place] as it is closer to him and easier drive for him.    Past Psychiatric History: MDD, alcohol abuse, anxiety  Previous Psychotropic Medications: Yes   Substance Abuse History in the last 12 months:  Yes.    Consequences of Substance Abuse: Medical Consequences:  Health conditions Legal Consequences:  Hx of DWI Withdrawal Symptoms:   Cramps Diaphoresis Diarrhea Headaches Nausea Tremors  Past Medical History:  Past  Medical History:  Diagnosis Date   ADHD (attention deficit hyperactivity disorder)    Diagnosed during teenage years   Alcohol use disorder    On average 14-21 drinks per week   Alcoholic cirrhosis of liver    Per patient - condition is not related to moderate alcohol use but rather prolonged NSAID use and recent abuse.   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 [place]   Major depressive disorder    Other spondylosis with radiculopathy, cervical region    Retinopathy    Right eye; per patient report   Type 2 diabetes mellitus with hyperglycemia     Past Surgical History:  Procedure Laterality Date   CHOLECYSTECTOMY N/A 06/02/2018   Procedure: LAPAROSCOPIC CHOLECYSTECTOMY;  Surgeon: Vickie Epley, MD;  Location: [facility] ORS;  Service: General;  Laterality: N/A;   KNEE ARTHROSCOPY     30 Years ago    Family Psychiatric History: Sister- bipolar disorder  Family History:  Family History  Problem Relation Age of Onset   Aneurysm Father    Multiple sclerosis Sister    Emphysema Maternal Grandfather    Diabetes Neg Hx     Social History:   Social History   Socioeconomic History   Marital status: Single    Spouse name: Not on file   Number of children: Not on file   Years of education: 18   Highest education level: Master's degree (e.g., MA, MS, MEng, MEd, MSW, MBA)  Occupational History   Not on file  Tobacco Use   Smoking status: Former    Pack years: 0.00    Types: Cigarettes    Quit date: 06/25/2003    Years since quitting: 17.6   Smokeless tobacco: Never   Tobacco comments:    Currently vapes  Vaping Use   Vaping Use: Every day  Substance and Sexual Activity   Alcohol use: Yes    Alcohol/week: 14.0 - 21.0 standard drinks    Types: 14 - 21 Cans of beer per week    Comment: 2-3 beers per night on average; sometimes up to 6   Drug use: Yes    Types: Marijuana    Comment: 1/week to help sleep; edibles, does not smoke   Sexual activity: Yes  Other Topics Concern   Not on file  Social History Narrative   Not on file   Social Determinants of Health   Financial Resource Strain: Not on file  Food Insecurity: Not on file  Transportation Needs: Not on file   Physical Activity: Not on file  Stress: Not on file  Social Connections: Not on file    Additional Social History:   Allergies:   Allergies  Allergen Reactions   Hydrocodone Itching    Itching, face tingling, dizziness.   Oxycontin [Oxycodone Hcl] Itching    Metabolic Disorder Labs: Lab Results  Component Value Date   HGBA1C 7.2 (H) 01/18/2021   MPG 160 01/18/2021   MPG 192 01/12/2019   No results found for: PROLACTIN Lab Results  Component Value Date   CHOL  122 06/14/2020   TRIG 77 06/14/2020   HDL 34 (L) 06/14/2020   CHOLHDL 3.6 06/14/2020   LDLCALC 72 06/14/2020   LDLCALC 76 07/01/2019   Lab Results  Component Value Date   TSH 3.970 06/27/2020    Therapeutic Level Labs: No results found for: LITHIUM No results found for: CBMZ No results found for: VALPROATE  Current Medications: Current Outpatient Medications  Medication Sig Dispense Refill   Accu-Chek FastClix Lancets MISC USE AS DIRECTED 2 TIMES DAILY 180 each 0   acidophilus (RISAQUAD) CAPS capsule Take 1 capsule by mouth daily.     Alpha-Lipoic Acid 600 MG CAPS Take 1,200 mg by mouth in the morning.     Blood Glucose Monitoring Suppl (ACCU-CHEK GUIDE ME) w/Device KIT 1 kit by Does not apply route 3 (three) times daily. 1 kit 0   Blood Glucose Monitoring Suppl (ACCU-CHEK GUIDE) w/Device KIT USE AS DIRECTED 1 kit 0   Cholecalciferol (VITAMIN D) 125 MCG (5000 UT) CAPS Take 5,000 Units by mouth daily.     glucose blood test strip USE AS DIRECTED 2 TIMES DAILY (Patient taking differently: USE AS DIRECTED 2 TIMES DAILY) 180 strip 0   imatinib (GLEEVEC) 400 MG tablet Take 1 tablet (400 mg total) by mouth daily. 30 tablet 5   insulin glargine (LANTUS SOLOSTAR) 100 UNIT/ML Solostar Pen Inject 22 Units into the skin every morning. And pen needles 1/day (Patient not taking: Reported on 01/12/2021)     insulin glargine (LANTUS) 100 UNIT/ML Solostar Pen INJECT 18 UNITS INTO THE SKIN EVERY MORNING. (Patient taking  differently: Inject 22 Units into the skin in the morning.) 15 mL 2   Insulin Pen Needle 32G X 4 MM MISC USE TO INJECT VICTOZA ONCE DAILY. 100 each 11   lisinopril (ZESTRIL) 20 MG tablet TAKE 1 TABLET (20 MG TOTAL) BY MOUTH DAILY. (Patient taking differently: Take 20 mg by mouth daily.) 90 tablet 0   metFORMIN (GLUCOPHAGE-XR) 500 MG 24 hr tablet TAKE 2 TABLETS (1,000 MG TOTAL) BY MOUTH 2 (TWO) TIMES DAILY WITH A MEAL. (Patient taking differently: Take 1,000 mg by mouth 2 (two) times daily with a meal.) 120 tablet 2   methocarbamol (ROBAXIN) 500 MG tablet Take 1 tablet (500 mg total) by mouth every 6 (six) hours as needed for muscle spasms. 40 tablet 3   Misc Natural Products (GINSENG-RED CHINESE PO) Take 4,000 mg by mouth daily.     Multiple Vitamins-Minerals (MULTIVITAMIN WITH MINERALS) tablet Take 1 tablet by mouth daily.      OVER THE COUNTER MEDICATION Take 2,000 mg by mouth daily. Black Seed Oil     OVER THE COUNTER MEDICATION Take 1 capsule by mouth daily. Testosterone Support     topiramate (TOPAMAX) 50 MG tablet TAKE 1 TABLET BY MOUTH IN THE MORNING AND 2 TABLETS AT BEDTIME. PLEASE CALL YOUR SPECIALIST TO SCHEDULE FOLLOW UP (Patient taking differently: Take 50 mg by mouth 2 (two) times daily.) 270 tablet 3   traMADol (ULTRAM) 50 MG tablet TAKE 1-2 TABLETS (50-100 MG TOTAL) BY MOUTH 3 (THREE) TIMES DAILY AS NEEDED FOR SEVERE PAIN. MAY CAUSE DROWSINESS. (Patient taking differently: Take 50-100 mg by mouth 3 (three) times daily as needed for severe pain.) 60 tablet 0   traMADol (ULTRAM) 50 MG tablet TAKE 1 TABLET (50 MG TOTAL) BY MOUTH EVERY 12 (TWELVE) HOURS AS NEEDED FOR UP TO 15 DAYS FOR SEVERE PAIN. (Patient not taking: No sig reported) 30 tablet 0   No current facility-administered medications  for this visit.    Psychiatric Specialty Exam: Review of Systems  There were no vitals taken for this visit.There is no height or weight on file to calculate BMI.  General Appearance: Fairly  groomed, in distress due to aback pain after surgical procedure  Eye Contact:  Good  Speech:  Clear and Coherent and Normal Rate, Talkative +++  Volume:  Normal  Mood:  Euthymic  Affect:  Congruent  Thought Process:  Goal Directed and Descriptions of Associations: Circumstantial  Orientation:  Full (Time, Place, and Person)  Thought Content:  Logical  Suicidal Thoughts:  No  Homicidal Thoughts:  No  Memory:  Immediate;   Good Recent;   Good  Judgement:  Fair  Insight:  Fair  Psychomotor Activity:  Normal  Concentration:  Concentration: Fair and Attention Span: Fair  Recall:  Good  Fund of Knowledge:Good  Language: Good  Akathisia:  Negative  Handed:  Right  AIMS (if indicated):  not done  Assets:  Communication Skills Desire for Improvement Financial Resources/Insurance Housing Social Support  ADL's:  Intact  Cognition: WNL  Sleep:  Poor due to chronic pain   Screenings: GAD-7    Flowsheet Row Office Visit from 05/23/2020 in [place] Office Visit from 07/01/2019 in [place] Office Visit from 11/28/2018 in [place] Office Visit from 10/06/2018 in [place] Office Visit from 08/06/2018 in [place]  Total GAD-7 Score 13 0 $Re'2 20 14      'cFs$ PHQ2-9    Flowsheet Row Video Visit from 11/07/2020 in [facility] Office Visit from 10/26/2020 in [place] Office Visit from 09/14/2020 in [place] Visit from 06/14/2020 in [place] Visit from 05/23/2020 in [place]  PHQ-2 Total Score 0 $Remov'1 6 6 6  'hmvjhV$ PHQ-9 Total Score 7 -- $Re'27 22 23      'FaP$ Flowsheet Row Admission (Discharged) from 01/24/2021 in [place] [facility] from 01/18/2021 in [facility] PREADMISSION TESTING Video Visit from 11/07/2020 in [place] No Risk No Risk No Risk       Assessment and Plan: Patient continues to be talkative and overinclusive in his conversation, which appears to be his baseline.  Patient is in a lot of pain and distress due to the recent back surgery he underwent end of May.  He stated that he is hoping that he can recover soon and have better quality of life.  He wants to make use of his degree in psychology in the future.  He does not want to start any psychotropic medications at this point.   1. 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 Anxiety 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   3. Alcohol use disorder, moderate, dependence ([place]) -Patient was encouraged to avoid excessive consumption of alcohol.   F/up in 10 weeks. Patient was informed that he is being transferred to [facility] psychiatry clinic due to the writer leaving the office.  Patient verbalized his understanding.   [place] Crane, MD 6/14/20223:04 PM

## 2021-02-08 ENCOUNTER — Other Ambulatory Visit (HOSPITAL_COMMUNITY): Payer: Self-pay

## 2021-02-15 DIAGNOSIS — M4317 Spondylolisthesis, lumbosacral region: Secondary | ICD-10-CM | POA: Diagnosis not present

## 2021-02-21 ENCOUNTER — Other Ambulatory Visit (HOSPITAL_COMMUNITY): Payer: Self-pay

## 2021-02-22 ENCOUNTER — Other Ambulatory Visit (HOSPITAL_COMMUNITY): Payer: Self-pay

## 2021-02-23 ENCOUNTER — Other Ambulatory Visit (HOSPITAL_COMMUNITY): Payer: Self-pay

## 2021-02-28 ENCOUNTER — Other Ambulatory Visit: Payer: Self-pay | Admitting: Family Medicine

## 2021-02-28 ENCOUNTER — Other Ambulatory Visit: Payer: Self-pay

## 2021-02-28 DIAGNOSIS — I10 Essential (primary) hypertension: Secondary | ICD-10-CM

## 2021-02-28 MED ORDER — LISINOPRIL 20 MG PO TABS
ORAL_TABLET | Freq: Every day | ORAL | 0 refills | Status: DC
  Filled 2021-02-28: qty 90, 90d supply, fill #0

## 2021-03-01 ENCOUNTER — Ambulatory Visit: Payer: Medicaid Other | Admitting: Adult Health

## 2021-03-01 ENCOUNTER — Encounter: Payer: Self-pay | Admitting: Adult Health

## 2021-03-01 ENCOUNTER — Other Ambulatory Visit: Payer: Self-pay

## 2021-03-01 VITALS — BP 139/95 | HR 121 | Ht 74.0 in | Wt 217.0 lb

## 2021-03-01 DIAGNOSIS — E1142 Type 2 diabetes mellitus with diabetic polyneuropathy: Secondary | ICD-10-CM | POA: Diagnosis not present

## 2021-03-01 DIAGNOSIS — R4189 Other symptoms and signs involving cognitive functions and awareness: Secondary | ICD-10-CM | POA: Diagnosis not present

## 2021-03-01 MED ORDER — TOPIRAMATE 100 MG PO TABS
100.0000 mg | ORAL_TABLET | Freq: Two times a day (BID) | ORAL | 3 refills | Status: DC
  Filled 2021-03-01: qty 180, 90d supply, fill #0

## 2021-03-01 NOTE — Progress Notes (Signed)
[facility] [address] [place]. [place] [address] [phone]       OFFICE FOLLOW UP VISIT NOTE  [name] Date of Birth:  [birth year] Medical Record Number:  [id number]   Referring MD: Gerald Olson Reason for Referral: Neuropathy   Chief Complaint  Patient presents with   Follow-up    RM 3 alone Pt is well, neuropathy has gotten worse since surgery, particularly that L leg.  Cognition is stable.       HPI:  Today, 03/01/2021, Gerald Olson returns for 33-monthfollow-up regarding neuropathy and cognitive complaints.  Since prior visit, underwent surgical decompression and arthrodesis at L5-S1 by Dr. EEllene Routefor spondylolisthesis L5-S1 with lumbar radiculopathy.  He reports increased pain since procedure particularly left leg -he reports recently being seen by Dr. EEllene Routeand was told to further discuss pain management options with our office today (again, per patient report). He has remained on topiramate 50 mg AM and 100 mg PM in combination with alpha-lipoic acid which have previously been stable for diabetic neuropathy.  He has no longer on duloxetine.  He discusses use of Percocet after procedure but this makes him too drowsy.  He was seen by his PCP who placed him on tramadol with benefit -he reports previously being seen by pain management but does not wish to go back due to restriction of other providers managing pain or prescribing medications.  He continues to follow with psychiatry for depression with recent visit 6/14.  Reports cognition stable since prior visit.  He was also seen by PCP 6/14 but unable to access OV note for review.  No further concerns at this time    History provided for reference purposes only Update 08/30/2020 Gerald Olson: Mr. LFoisyreturns for follow-up after being seen 4 months prior for neuropathy and cognitive complaints.  Remains on topiramate 50 mg twice daily for diabetic neuropathy initially providing benefit and even improving  especially in combination with alpha-lipoic acid but unfortunately suffered a fall approximately 1 month ago not experiencing thoracic pain and worsening left leg radiculopathy. He has follow-up visit with orthopedics on Friday for further evaluation. Cognition stable without worsening.  Initiated duloxetine at prior visit in hopes of benefit of depression as well as paresthesias but he self discontinued as he did not feel as though this was beneficial and reported dizziness.  He routinely is followed by psychiatry for depression and anxiety but currently declines interest in antidepression medications. He continues to follow with orthopedics Dr. MRodell Pernafor spinal and lumbar stenosis previously participating in outpatient PT but currently on hold due to recent fall. 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 No further concerns at this time.  Update 04/27/2020 Gerald Olson: Mr. LCalderwoodreturns today for neuropathy follow-up Since prior visit, he has noticed some improvement in regards to right leg > left leg neuropathy but does report continued numbness sensation.  He believes Topamax 25 mg nightly has been beneficial and he has tolerated well.  He did trial taking 2 tablets for a couple of nights with further improvement and denies side effects.   He is also been having increased cervical pain radiating down left arm.  He has not contacted Gerald Olson regards to worsening symptoms over the past 2 months.  He also reports right shoulder burning type pain that can wake him up while sleeping.  He reports he has not slept in the past 3  nights due to neck and shoulder pain.  He questions possibility of fibromyalgia due to multiple pain locations. Awaiting lumbar and cervical procedure once diabetes becomes well controlled but he continues to have difficulty.  Continues to follow with endocrinology. He also complains of cognitive concerns including  short-term and long-term memory, delayed recall and occasional word finding difficulty.  He denies any prior stroke history or TBI.  He did have neurocognitive evaluation by Gerald Olson on 03/31/2020 did show just chronic weakness across processing speed and variability noted approximately complex attention as well as encoding and retrieval aspects of verbal and visual memory.  Etiology potentially pronounced psychiatric distress, ongoing sleep dysfunction, chronic pain, history of ADHD, vascular factors such as uncontrolled diabetes and potentially vascular changes would affect similar areas of cognitive functioning which could contribute to ongoing deficits.  No concerns for early onset neurodegenerative illness such as Alzheimer's disease at the time of testing. Recommendations of obtaining MRI for further evaluation as well as medication and psychotherapy for treatment of anxiety and depression as well as pain management and different lifestyle options (heavy alcohol intake, heavy caffeine intake, and THC use).  Update 01/28/2020 Gerald Olson : He returns for follow-up after last visit 6 months ago.  He states that he did increase the gabapentin but was taking it only twice daily.  It helps with the pins-and-needles sensation but is still in constant pain as well as feels numb.  In the last few weeks he feels symptoms may gotten worse.  Is also gained about 20 pounds this year.  He also complains of involuntary myoclonic jerk-like movements in his left hand.  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  Lab works for reversible causes of neuropathy on 10/08/2019 pretty much normal except for low vitamin D levels of 114.  Patient states he has known low vitamin D level since last 20 years and was on vitamin D replacement however few  weeks prior to seeing me and last visit here ran out of vitamin D and had not restarted taking it due to financial reasons.  He has chronic back pain with left leg radiculopathy and he plans on having back surgery but needs to lose some weight prior to that.  He has been taking while like to switch seems to help his chronic pain.  He has not tried Topamax and is willing to try that.  Initial visit 10/08/2019 Gerald Olson: Gerald Olson is a 55 year old Caucasian male with past medical history of diabetes, hypertension, obesity, degenerative arthritis and chronic back and neck pain who has been having lower extremity paresthesias for the last 6 months which seem to be progressive.  He complains of constant burning, tingling and pins and needle sensation.  At times this becomes bearable.  This is present throughout the day.  He recently started taking gabapentin 100 mg twice daily which is not being effective.  He is also noticed that his balance is poor and has had frequent falls.  In fact he had a fall yesterday and today seems to be in acute pain and his gait is limited.  He has a longstanding history of degenerative cervical and lumbar spine disease.  He has been seen by orthopedic surgeon wants to operate on his neck but his diabetes is out of control and is working with endocrinologist to  bring it under control so he can have the surgery.  Review of electronic medical records show that he had a cervical spine x-ray on 09/22/2019 which showed degenerative changes from C3-C7.  X-rays of lumbar spine on 07/21/2018 show significant facet degenerative changes at L5-S1 with significant foraminal narrowing on the right at the L4-5 and on the left at L5-S1.  He takes tramadol and muscle relaxant Robaxin which helps partially only.  He has not had any EMG nerve conduction studies done or lab work for other reversible causes of neuropathy done.  He states his feet are now constantly numb with lack of feeling which has been  contributing to his falling.  His sugars have been persistently high and last A1c on 10/01/2019 was yet high at 7.6 but improved from 9.1 from 3 months ago.      ROS:   14 system review of systems is positive for see HPI and all other systems negative  PMH:  Past Medical History:  Diagnosis Date   ADHD (attention deficit hyperactivity disorder)    Diagnosed during teenage years   Alcohol use disorder    On average 14-21 drinks per week   Alcoholic cirrhosis of liver    Per patient - condition is not related to moderate alcohol use but rather prolonged NSAID use and recent abuse.   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[place]   Major depressive disorder    Other spondylosis with radiculopathy, cervical region    Retinopathy    Right eye; per patient report   Type 2 diabetes mellitus with hyperglycemia     Social History:  Social History   Socioeconomic History   Marital status: Single    Spouse name: Not on file   Number of children: Not on file   Years of education: 18   Highest education level: Master's degree (e.g., MA, MS, MEng, MEd, MSW, MBA)  Occupational History   Not on file  Tobacco Use   Smoking status: Former    Pack years: 0.00    Types: Cigarettes    Quit date: 06/25/2003    Years since quitting: 17.6   Smokeless tobacco: Never   Tobacco comments:    Currently vapes  Vaping Use   Vaping Use: Every day  Substance and Sexual Activity   Alcohol use: Yes    Alcohol/week: 14.0 - 21.0 standard drinks    Types: 14 - 21 Cans of beer per week    Comment: 2-3 beers per night on average; sometimes up  to 6   Drug use: Yes    Types: Marijuana    Comment: 1/week to help sleep; edibles, does not smoke   Sexual activity: Yes  Other Topics Concern   Not on file  Social History Narrative   Not on file   Social Determinants of Health   Financial Resource Strain: Not on file  Food Insecurity: Not on file  Transportation Needs: Not on file  Physical Activity: Not on file  Stress: Not on file  Social Connections: Not on file  Intimate Partner Violence: Not on file    Medications:   Current Outpatient Medications on File Prior to Visit  Medication Sig Dispense Refill   Accu-Chek FastClix Lancets MISC USE AS DIRECTED 2 TIMES DAILY 180 each 0   acidophilus (RISAQUAD) CAPS capsule Take 1 capsule by mouth daily.     Alpha-Lipoic Acid 600 MG CAPS Take 1,200 mg by mouth in the morning.     Blood Glucose Monitoring Suppl (ACCU-CHEK GUIDE ME) w/Device KIT 1 kit by Does not apply Olson 3 (three) times daily. 1 kit 0   Cholecalciferol (VITAMIN D) 125 MCG (5000 UT) CAPS Take 5,000 Units by mouth daily.     glucose blood test strip USE AS DIRECTED 2 TIMES DAILY (Patient taking differently: USE AS DIRECTED 2 TIMES DAILY) 180 strip 0   imatinib (GLEEVEC) 400 MG tablet Take 1 tablet (400 mg total) by mouth daily. 30 tablet 5   insulin glargine (LANTUS SOLOSTAR) 100 UNIT/ML Solostar Pen Inject 22 Units into the skin every morning. And pen needles 1/day     insulin glargine (LANTUS) 100 UNIT/ML Solostar Pen INJECT 18 UNITS INTO THE SKIN EVERY MORNING. (Patient taking differently: Inject 22 Units into the skin in the morning.) 15 mL 2   Insulin Pen Needle 32G X 4 MM MISC USE TO INJECT VICTOZA ONCE DAILY. 100 each 11   lisinopril (ZESTRIL) 20 MG tablet TAKE 1 TABLET (20 MG TOTAL) BY MOUTH DAILY. 90 tablet 0   metFORMIN (GLUCOPHAGE-XR) 500 MG 24 hr tablet TAKE 2 TABLETS (1,000 MG TOTAL) BY MOUTH 2 (TWO) TIMES DAILY WITH A MEAL. (Patient taking differently: Take 1,000 mg by mouth 2 (two) times daily with a  meal.) 120 tablet 2   methocarbamol (ROBAXIN) 500 MG tablet Take 1 tablet (500 mg total) by mouth every 6 (six) hours as needed for muscle spasms. 40 tablet 3   Misc Natural Products (GINSENG-RED CHINESE PO) Take 4,000 mg by mouth daily.     Multiple Vitamins-Minerals (MULTIVITAMIN WITH MINERALS) tablet Take 1 tablet by mouth daily.      OVER THE COUNTER MEDICATION Take 2,000 mg by mouth daily. Black Seed Oil     OVER THE COUNTER MEDICATION Take 1 capsule by mouth daily. Testosterone Support     topiramate (TOPAMAX) 50 MG tablet TAKE 1 TABLET BY MOUTH IN THE MORNING AND 2 TABLETS AT BEDTIME. PLEASE CALL YOUR SPECIALIST TO SCHEDULE FOLLOW UP 270 tablet 3   traMADol (ULTRAM) 50 MG tablet TAKE 1-2 TABLETS (50-100 MG TOTAL) BY MOUTH 3 (THREE) TIMES DAILY AS NEEDED FOR SEVERE PAIN. MAY CAUSE DROWSINESS. 60 tablet 0   [DISCONTINUED] DULoxetine (CYMBALTA) 60 MG capsule Take 1 capsule (60 mg total) by mouth 2 (two) times daily. (Patient not taking: No sig reported) 60 capsule 1   No current facility-administered medications on file prior to visit.    Allergies:   No Active Allergies    Today's Vitals   03/01/21 1445  BP: (!) 139/95  Pulse: (!) 121  Weight: 217 lb (98.4 kg)  Height: _0  (1.88 m)   Body mass index is 27.86 kg/m.   Physical Exam General: Pleasant middle-aged Caucasian male seated, in no evident distress Head: head normocephalic and atraumatic.   Neck: supple with no carotid or supraclavicular bruits Cardiovascular: regular rate and rhythm, no murmurs Skin:  no rash/petichiae Vascular:  Normal pulses all extremities  Neurologic Exam Mental Status: Awake and fully alert. Oriented to place and time. Recent and remote memory intact during  visit. Attention span, concentration and fund of knowledge appropriate during visit. Mood and affect appropriate.   Cranial Nerves: Pupils equal, briskly reactive to light. Extraocular movements full without nystagmus. Visual fields full  to confrontation. Hearing intact. Facial sensation intact. Face, tongue, palate moves normally and symmetrically.  Motor: Normal bulk and tone. Normal strength in all tested extremity muscles.   Sensory.:  Diminished   touch , pinprick sensation in both feet stocking distribution.  Impaired vibration in both feet from ankle down.  Position sense mildly impaired.  Romberg sign negative.  Fine action tremor of both outstretched upper extremities left greater than right.  No pill-rolling or cogwheel rigidity. Coordination: Rapid alternating movements normal in all extremities. Finger-to-nose and heel-to-shin performed accurately bilaterally. Gait and Station: Arises from chair with  difficulty. Stance is stooped and favors his back and left leg.  Broad-based antalgic gait uses a cane. Reflexes: 1+ and symmetric in upper extremities and absent in both lower extremities. Toes downgoing.       ASSESSMENT/PLAN: 55 year old Caucasian male with lower extremity paresthesias and pain due to worsening diabetic peripheral neuropathic pain.  He also has chronic back pain and left leg sciatica as well as increasing falls and gait difficulties due to degenerative lumbar and cervical spine disease. Prior complaints of cognitive deficits which have been stable.     Peripheral neuropathy, diabetic -EMG/NCV 10/22/2019 diabetic peripheral polyneuropathy -Increase topiramate to 13m BID -may consider use of amitriptyline in the future if needed but if unable to adequately control neuropathy, will refer to pain management especially with history of chronic pain.  Worsening of symptoms since lumbar procedure (see below) and was advised to discuss further with Dr. EEllene Routefor further pain management options.   Spondylolisthesis L5-S1 with lumbar radiculopathy Cervical pain -s/p surgical decompression and arthrodesis L5-S1 by Dr. EEllene Olson-Worsening neuropathy pain post procedure and advised to follow-up with neurosurgery  for further treatment options  Cognitive deficits -Overall stable since prior without worsening -per neurocognitive evaluation concern for cognitive decline compared to prior testing although reported main contributor likely psychiatric distress but also potentially vascular in nature -MRI brain w/wo contrast 05/29/2020 scattered T2/FLAIR punctate hyperintense foci in the subcortical deep white matter likely representing mild chronic microvascular ischemic change in brain volume mildly less than expected for age otherwise unremarkable -Continues to be followed by psychiatry    Follow-up in 6 months or call earlier if needed   CC:  GRio Hondoprovider: Dr. SJerrol Banana DVickki Muff PA-C    I spent 35 minutes of face-to-face and non-face-to-face time with patient.  This included previsit chart review, lab review, study review, order entry, electronic health record documentation, patient education/discussion regarding ongoing neuropathy with paresthesias and further treatment recommendations, cognitive complaints, lumbar procedure and continued cervical pain and answered all other questions to patient satisfaction   JFrann Rider AGNP-BC  [facility] [address] [address] Phone [phone] [phone]: This document was prepared with digital dictation and possible smart phrase technology. Any transcriptional errors that result from this process are unintentional. What is mild for Elevated When She Went to normal already Having normal UTI yearly normal discharge diet adjusted

## 2021-03-01 NOTE — Patient Instructions (Addendum)
Your Plan:  Increase topamax to 100mg  twice daily for worsening neuropathy - if no benefit after dosage increase, may consider trialing amitriptyline (Elavil)   Continue to follow up with neurosurgery for worsening pain since procedure     Follow up in 6 months or call earlier if needed        Thank you for coming to see [place] at [facility]. I hope we have been able to provide you high quality care today.  You may receive a patient satisfaction survey over the next few weeks. We would appreciate your feedback and comments so that we may continue to improve ourselves and the health of our patients.

## 2021-03-06 ENCOUNTER — Other Ambulatory Visit: Payer: Self-pay | Admitting: Urology

## 2021-03-07 NOTE — Progress Notes (Signed)
I agree with the above plan 

## 2021-03-14 ENCOUNTER — Inpatient Hospital Stay: Payer: Medicaid Other | Attending: Oncology

## 2021-03-14 DIAGNOSIS — C9210 Chronic myeloid leukemia, BCR/ABL-positive, not having achieved remission: Secondary | ICD-10-CM | POA: Diagnosis not present

## 2021-03-14 LAB — COMPREHENSIVE METABOLIC PANEL
ALT: 24 U/L (ref 0–44)
AST: 33 U/L (ref 15–41)
Albumin: 3.2 g/dL — ABNORMAL LOW (ref 3.5–5.0)
Alkaline Phosphatase: 77 U/L (ref 38–126)
Anion gap: 8 (ref 5–15)
BUN: 19 mg/dL (ref 6–20)
CO2: 21 mmol/L — ABNORMAL LOW (ref 22–32)
Calcium: 8.3 mg/dL — ABNORMAL LOW (ref 8.9–10.3)
Chloride: 104 mmol/L (ref 98–111)
Creatinine, Ser: 0.92 mg/dL (ref 0.61–1.24)
GFR, Estimated: >60 mL/min (ref >60)
Glucose, Bld: 173 mg/dL — ABNORMAL HIGH (ref 70–99)
Potassium: 4.1 mmol/L (ref 3.5–5.1)
Sodium: 133 mmol/L — ABNORMAL LOW (ref 135–145)
Total Bilirubin: 0.5 mg/dL (ref 0.3–1.2)
Total Protein: 5.2 g/dL — ABNORMAL LOW (ref 6.5–8.1)

## 2021-03-14 LAB — PHOSPHORUS: Phosphorus: 3.7 mg/dL (ref 2.5–4.6)

## 2021-03-14 LAB — CBC WITH DIFFERENTIAL/PLATELET
Abs Immature Granulocytes: 0.01 10*3/uL (ref 0.00–0.07)
Basophils Absolute: 0 10*3/uL (ref 0.0–0.1)
Basophils Relative: 1 %
Eosinophils Absolute: 0.2 10*3/uL (ref 0.0–0.5)
Eosinophils Relative: 3 %
HCT: 34.2 % — ABNORMAL LOW (ref 39.0–52.0)
Hemoglobin: 11.1 g/dL — ABNORMAL LOW (ref 13.0–17.0)
Immature Granulocytes: 0 %
Lymphocytes Relative: 25 %
Lymphs Abs: 1.6 10*3/uL (ref 0.7–4.0)
MCH: 28.2 pg (ref 26.0–34.0)
MCHC: 32.5 g/dL (ref 30.0–36.0)
MCV: 86.8 fL (ref 80.0–100.0)
Monocytes Absolute: 0.6 10*3/uL (ref 0.1–1.0)
Monocytes Relative: 9 %
Neutro Abs: 4 10*3/uL (ref 1.7–7.7)
Neutrophils Relative %: 62 %
Platelets: 252 10*3/uL (ref 150–400)
RBC: 3.94 MIL/uL — ABNORMAL LOW (ref 4.22–5.81)
RDW: 15.2 % (ref 11.5–15.5)
WBC: 6.4 10*3/uL (ref 4.0–10.5)
nRBC: 0 % (ref 0.0–0.2)

## 2021-03-14 LAB — MAGNESIUM: Magnesium: 2.2 mg/dL (ref 1.7–2.4)

## 2021-03-17 DIAGNOSIS — M4317 Spondylolisthesis, lumbosacral region: Secondary | ICD-10-CM | POA: Diagnosis not present

## 2021-03-20 ENCOUNTER — Telehealth: Payer: Self-pay | Admitting: Oncology

## 2021-03-20 NOTE — Telephone Encounter (Signed)
We can do a virtual appt if that works better for him. Any time this week or next week that works for him.

## 2021-03-20 NOTE — Telephone Encounter (Signed)
Patient called to reschedule his appointment from 7/26. He wanted to see if it would be possible to have this virtually? Please Advise,Thanks!  Anderson Malta

## 2021-03-21 ENCOUNTER — Inpatient Hospital Stay: Payer: Medicaid Other | Admitting: Pharmacist

## 2021-03-22 LAB — BCR-ABL1, CML/ALL, PCR, QUANT: Interpretation (BCRAL):: NEGATIVE

## 2021-03-24 ENCOUNTER — Other Ambulatory Visit (HOSPITAL_COMMUNITY): Payer: Self-pay

## 2021-03-28 ENCOUNTER — Inpatient Hospital Stay: Payer: Medicaid Other | Attending: Oncology | Admitting: Pharmacist

## 2021-03-28 DIAGNOSIS — C9210 Chronic myeloid leukemia, BCR/ABL-positive, not having achieved remission: Secondary | ICD-10-CM

## 2021-03-28 NOTE — Progress Notes (Signed)
[facility]  [phone] [phone]  Patient Care Team: Chrismon, Driscilla Grammes as PCP - General (Family Medicine)   Name of the patient: Gerald Olson  [id number]  01/24/1966   Date of visit: 03/28/21  Virtual visit: I connected with  Gerald Olson on 03/28/21 at  2:00 PM EDT by a video enabled telemedicine application and verified that I am speaking with the correct person using two identifiers. I discussed the limitations of evaluation and management by telemedicine and the availability of in person appointments. The patient expressed understanding and agreed to proceed. 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 Currently treated with Gleevec (imatinib), started on 07/28/20.  Reason for Consult: Oral chemotherapy follow-up for Gleevec (imatinib) therapy.   PAST MEDICAL HISTORY: Past Medical History:  Diagnosis Date   ADHD (attention deficit hyperactivity disorder)    Diagnosed during teenage years   Alcohol use disorder    On average 14-21 drinks per week   Alcoholic cirrhosis of liver    Per patient - condition is not related to moderate alcohol use but rather prolonged NSAID use and recent abuse.   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    ALLERGIES:  has no active allergies.  MEDICATIONS:  Current Outpatient Medications  Medication Sig Dispense Refill   Accu-Chek FastClix Lancets MISC USE AS DIRECTED 2 TIMES DAILY 180 each 0   acidophilus (RISAQUAD) CAPS capsule Take 1 capsule by mouth daily.     Alpha-Lipoic Acid 600 MG CAPS Take 1,200 mg by mouth in the morning.     Blood Glucose Monitoring Suppl (ACCU-CHEK GUIDE ME) w/Device KIT 1 kit by Does not apply route 3 (three) times daily. 1 kit 0   Cholecalciferol (VITAMIN D) 125 MCG (5000 UT) CAPS Take 5,000 Units by mouth daily.     glucose blood test strip USE AS DIRECTED 2 TIMES DAILY (Patient taking differently: USE AS DIRECTED 2 TIMES DAILY) 180 strip 0   imatinib (GLEEVEC) 400 MG tablet Take 1 tablet (400 mg total) by mouth daily. 30 tablet 5   insulin glargine (LANTUS SOLOSTAR) 100 UNIT/ML Solostar Pen Inject 22 Units into the skin every morning. And pen needles 1/day     insulin glargine (LANTUS) 100 UNIT/ML Solostar Pen INJECT 18 UNITS INTO THE SKIN EVERY MORNING. (Patient taking differently: Inject 22 Units into the skin in the morning.) 15 mL 2   Insulin Pen Needle 32G X 4 MM MISC USE TO INJECT VICTOZA ONCE DAILY. 100 each 11   lisinopril (ZESTRIL) 20 MG tablet TAKE 1 TABLET (20 MG TOTAL) BY MOUTH DAILY. 90 tablet 0   metFORMIN (GLUCOPHAGE-XR) 500 MG 24 hr tablet TAKE  2 TABLETS (1,000 MG TOTAL) BY MOUTH 2 (TWO) TIMES DAILY WITH A MEAL. (Patient taking differently: Take 1,000 mg by mouth 2 (two) times daily with a meal.) 120 tablet 2   methocarbamol (ROBAXIN) 500 MG tablet Take 1 tablet (500 mg total) by mouth every 6 (six) hours as needed for muscle spasms. 40 tablet 3   Misc Natural Products (GINSENG-RED CHINESE PO) Take 4,000 mg by mouth daily.     Multiple Vitamins-Minerals (MULTIVITAMIN WITH MINERALS) tablet Take 1 tablet by mouth daily.      OVER THE COUNTER MEDICATION Take 2,000 mg by mouth daily. Black Seed Oil     OVER THE COUNTER MEDICATION Take 1  capsule by mouth daily. Testosterone Support     topiramate (TOPAMAX) 100 MG tablet Take 1 tablet (100 mg total) by mouth 2 (two) times daily. 180 tablet 3   traMADol (ULTRAM) 50 MG tablet TAKE 1-2 TABLETS (50-100 MG TOTAL) BY MOUTH 3 (THREE) TIMES DAILY AS NEEDED FOR SEVERE PAIN. MAY CAUSE DROWSINESS. 60 tablet 0   No current facility-administered medications for this visit.    VITAL SIGNS: There were no vitals taken for this visit. There were no vitals filed for this visit.  Estimated body mass index is 27.86 kg/m as calculated from the following:   Height as of 03/01/21: _0  (1.88 m).   Weight as of 03/01/21: 98.4 kg (217 lb).  LABS: CBC:    Component Value Date/Time   WBC 6.4 03/14/2021 1357   HGB 11.1 (L) 03/14/2021 1357   HGB 14.1 09/14/2020 1554   HCT 34.2 (L) 03/14/2021 1357   HCT 42.3 09/14/2020 1554   PLT 252 03/14/2021 1357   PLT 263 09/14/2020 1554   MCV 86.8 03/14/2021 1357   MCV 88 09/14/2020 1554   NEUTROABS 4.0 03/14/2021 1357   NEUTROABS 3.9 09/14/2020 1554   LYMPHSABS 1.6 03/14/2021 1357   LYMPHSABS 1.7 09/14/2020 1554   MONOABS 0.6 03/14/2021 1357   EOSABS 0.2 03/14/2021 1357   EOSABS 0.1 09/14/2020 1554   BASOSABS 0.0 03/14/2021 1357   BASOSABS 0.0 09/14/2020 1554   Comprehensive Metabolic Panel:    Component Value Date/Time   NA 133 (L) 03/14/2021 1357   NA 139 09/14/2020 1554   K 4.1 03/14/2021 1357   CL 104 03/14/2021 1357   CO2 21 (L) 03/14/2021 1357   BUN 19 03/14/2021 1357   BUN 9 09/14/2020 1554   CREATININE 0.92 03/14/2021 1357   CREATININE 0.80 01/12/2019 1452   GLUCOSE 173 (H) 03/14/2021 1357   CALCIUM 8.3 (L) 03/14/2021 1357   AST 33 03/14/2021 1357   ALT 24 03/14/2021 1357   ALKPHOS 77 03/14/2021 1357   BILITOT 0.5 03/14/2021 1357   BILITOT 0.6 09/14/2020 1554   PROT 5.2 (L) 03/14/2021 1357   PROT 6.8 09/14/2020 1554   ALBUMIN 3.2 (L) 03/14/2021 1357   ALBUMIN 4.2 09/14/2020 1554    Present during today's visit: patient  only  Assessment and Plan-  BCR-ABL labs reviewed, disease continues to be controlled. Continue imatinib 46m daily.  ** Of note, He held his imatinib recently for his back surgery, he resumed his imatinib at the beginning of July 20222. Surgery was on 01/24/21.    Oral Chemotherapy Side Effect/Intolerance:  Diarrhea: patient reported diarrhea for the past 3 weeks and yesterday having many trips to the bathroom. He reports using loperamide and pepto-bismol to help manage the diarrhea. This is complicated by the fact that he is currently completing a Health  Plu Super Colon Cleanse. He did not previously have diarrhea from the imatinib.  Other: He reported events of tunnel vision, weakness, ringing in his ears, and feeling like he was going to pass out. This has been occurring over the past 6-7 weeks, with no regular pattern. This is likely not related to his imatinib. He reports weight lose since his surgery, has not been eating regularly, and has had diarrhea with his colon cleanse. I suggested he check in with his primary care/endocrinologist, whoever is leading his diabetes management. His symptoms could be from blood sugar irregularities.   Oral Chemotherapy Adherence: No reports missing about 2 doses a week recently. He does not like taking his imatinib unless he has had a decent meal due to the GI irritation imatinib can cause, and he has not been eating everyday. Diet seems to be the biggest barrier to adherence, he does not have central AC and notices that he does not eat much when it is hotter.   New medications: none reported  Medication Access Issues: no issues, he fills at [facility]  Patient expressed understanding and was in agreement with this plan. He also understands that He can call clinic at any time with any questions, concerns, or complaints.   Follow-up plan: Patient will f/u in 3 months with MD  Thank you for allowing me to participate in the care of this  very pleasant patient.   Time Total: 30 mins  Visit consisted of counseling and education on dealing with issues of symptom management in the setting of serious and potentially life-threatening illness.Greater than 50%  of this time was spent counseling and coordinating care related to the above assessment and plan.  Signed by: [name], PharmD, BCPS, Salley Slaughter, CPP Hematology/Oncology Clinical Pharmacist Practitioner [facility]/HP/AP [facility] [phone]  03/28/2021 9:26 AM

## 2021-04-04 ENCOUNTER — Encounter (INDEPENDENT_AMBULATORY_CARE_PROVIDER_SITE_OTHER): Payer: Medicaid Other | Admitting: Ophthalmology

## 2021-04-04 ENCOUNTER — Other Ambulatory Visit: Payer: Self-pay

## 2021-04-04 DIAGNOSIS — I10 Essential (primary) hypertension: Secondary | ICD-10-CM | POA: Diagnosis not present

## 2021-04-04 DIAGNOSIS — H35713 Central serous chorioretinopathy, bilateral: Secondary | ICD-10-CM | POA: Diagnosis not present

## 2021-04-04 DIAGNOSIS — H353132 Nonexudative age-related macular degeneration, bilateral, intermediate dry stage: Secondary | ICD-10-CM

## 2021-04-04 DIAGNOSIS — H43813 Vitreous degeneration, bilateral: Secondary | ICD-10-CM

## 2021-04-04 DIAGNOSIS — H35033 Hypertensive retinopathy, bilateral: Secondary | ICD-10-CM | POA: Diagnosis not present

## 2021-04-04 DIAGNOSIS — E113293 Type 2 diabetes mellitus with mild nonproliferative diabetic retinopathy without macular edema, bilateral: Secondary | ICD-10-CM

## 2021-04-04 DIAGNOSIS — H2513 Age-related nuclear cataract, bilateral: Secondary | ICD-10-CM

## 2021-04-06 ENCOUNTER — Other Ambulatory Visit (HOSPITAL_COMMUNITY): Payer: Self-pay

## 2021-04-10 ENCOUNTER — Other Ambulatory Visit: Payer: Self-pay

## 2021-04-10 ENCOUNTER — Other Ambulatory Visit: Payer: Self-pay | Admitting: Critical Care Medicine

## 2021-04-10 MED ORDER — LANTUS SOLOSTAR 100 UNIT/ML ~~LOC~~ SOPN
PEN_INJECTOR | SUBCUTANEOUS | 0 refills | Status: DC
  Filled 2021-04-10: qty 15, 83d supply, fill #0

## 2021-04-10 MED FILL — Glucose Blood Test Strip: 25 days supply | Qty: 50 | Fill #0 | Status: AC

## 2021-04-10 NOTE — Telephone Encounter (Signed)
   Notes to clinic:  Medication filled by a different provider  Review for continued use    Requested Prescriptions  Pending Prescriptions Disp Refills   insulin glargine (LANTUS SOLOSTAR) 100 UNIT/ML Solostar Pen 15 mL 2    Sig: INJECT 18 UNITS INTO THE SKIN EVERY MORNING.     There is no refill protocol information for this order

## 2021-04-11 ENCOUNTER — Other Ambulatory Visit: Payer: Self-pay

## 2021-04-12 ENCOUNTER — Other Ambulatory Visit (HOSPITAL_COMMUNITY): Payer: Self-pay

## 2021-04-14 ENCOUNTER — Other Ambulatory Visit: Payer: Self-pay
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
## 2021-04-17 ENCOUNTER — Other Ambulatory Visit: Payer: Self-pay

## 2021-04-17 ENCOUNTER — Other Ambulatory Visit: Payer: Self-pay | Admitting: Adult Health

## 2021-04-17 ENCOUNTER — Encounter: Payer: Self-pay | Admitting: Psychiatry

## 2021-04-17 ENCOUNTER — Telehealth (INDEPENDENT_AMBULATORY_CARE_PROVIDER_SITE_OTHER): Payer: Medicaid Other | Admitting: Psychiatry

## 2021-04-17 DIAGNOSIS — F330 Major depressive disorder, recurrent, mild: Secondary | ICD-10-CM | POA: Diagnosis not present

## 2021-04-17 DIAGNOSIS — F1020 Alcohol dependence, uncomplicated: Secondary | ICD-10-CM

## 2021-04-17 MED FILL — Insulin Pen Needle 32 G X 4 MM (1/6" or 5/32"): 34 days supply | Qty: 100 | Fill #1 | Status: AC

## 2021-04-17 NOTE — Patient Instructions (Addendum)
Referral to therapy  Return as needed if interested in pharmacological treatment

## 2021-04-18 ENCOUNTER — Other Ambulatory Visit: Payer: Self-pay

## 2021-04-18 MED ORDER — TRAMADOL HCL 50 MG PO TABS
ORAL_TABLET | ORAL | 0 refills | Status: DC
  Filled 2021-04-18: qty 30, 5d supply, fill #0

## 2021-04-19 ENCOUNTER — Ambulatory Visit: Payer: Medicaid Other | Admitting: Endocrinology

## 2021-04-19 ENCOUNTER — Other Ambulatory Visit: Payer: Self-pay

## 2021-04-19 VITALS — BP 150/80 | HR 82 | Ht 74.0 in | Wt 232.2 lb

## 2021-04-19 DIAGNOSIS — E1165 Type 2 diabetes mellitus with hyperglycemia: Secondary | ICD-10-CM | POA: Diagnosis not present

## 2021-04-19 LAB — POCT GLYCOSYLATED HEMOGLOBIN (HGB A1C): Hemoglobin A1C: 5.9 % — AB (ref 4.0–5.6)

## 2021-04-19 MED ORDER — LANTUS SOLOSTAR 100 UNIT/ML ~~LOC~~ SOPN
14.0000 [IU] | PEN_INJECTOR | SUBCUTANEOUS | 3 refills | Status: DC
Start: 2021-04-19 — End: 2021-06-19
  Filled 2021-04-19: qty 15, 107d supply, fill #0

## 2021-04-19 NOTE — Progress Notes (Signed)
Subjective:    Patient ID: Gerald Olson, male    DOB: 08-10-1966, 55 y.o.   MRN: [id number]  HPI Pt returns for f/u of diabetes mellitus:  DM type: Insulin-requiring type 2 Dx'ed: 2006 (he lost 70 lbs, so normoglycemia returned until 2019).   Complications: PN, PAD, and DR Therapy: insulin since 2020. DKA: never Severe hypoglycemia: never Pancreatitis: once (2021), on Victoza.   Pancreatic imaging: normal on 2021 CT SDOH: medicaid pays for lantus.  Other: He declines multiple daily injections; he declined to continue farxiga.    Interval history: Meter is downloaded today, and the printout is scanned into the record.  cbg's vary from 80-170.  There is no trend throughout the day.  He stopped metformin, due to lightheadedness.  1 week ago, he reduced Lantus to 18 units qd.   Past Medical History:  Diagnosis Date   ADHD (attention deficit hyperactivity disorder)    Diagnosed during teenage years   Alcohol use disorder    On average 14-21 drinks per week   Alcoholic cirrhosis of liver    Per patient - condition is not related to moderate alcohol use but rather prolonged NSAID use and recent abuse.   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[place]   Major depressive disorder    Other spondylosis with radiculopathy, cervical region    Retinopathy    Right eye; per patient report   Type 2 diabetes mellitus with hyperglycemia     Past Surgical History:  Procedure Laterality Date   CHOLECYSTECTOMY N/A 06/02/2018   Procedure: LAPAROSCOPIC CHOLECYSTECTOMY;  Surgeon: Ancil Linsey, MD;  Location: [facility] ORS;  Service:  General;  Laterality: N/A;   KNEE ARTHROSCOPY     30 Years ago   lumbar five-sacral one posterior lumbar interbody fusion  01/24/2021    Social History   Socioeconomic History   Marital status: Single    Spouse name: Not on file   Number of children: Not on file   Years of education: 18   Highest education level: Master's degree (e.g., MA, MS, MEng, MEd, MSW, MBA)  Occupational History   Not on file  Tobacco Use   Smoking status: Former    Types: Cigarettes    Quit date: 06/25/2003    Years since quitting: 17.8   Smokeless tobacco: Never   Tobacco comments:    Currently vapes  Vaping Use   Vaping Use: Every day  Substance and Sexual Activity   Alcohol use: Yes    Alcohol/week: 14.0 - 21.0 standard drinks    Types: 14 - 21 Cans of beer per week    Comment: 2-3 beers per night on average; sometimes up to 6   Drug use: Yes    Types: Marijuana    Comment: 1/week to help sleep; edibles, does not smoke   Sexual activity: Yes  Other Topics Concern   Not on file  Social History Narrative   Not on file   Social Determinants of Health   Financial Resource Strain: Not on file  Food Insecurity: Not  on file  Transportation Needs: Not on file  Physical Activity: Not on file  Stress: Not on file  Social Connections: Not on file  Intimate Partner Violence: Not on file    Current Outpatient Medications on File Prior to Visit  Medication Sig Dispense Refill   Accu-Chek FastClix Lancets MISC USE AS DIRECTED 2 TIMES DAILY 180 each 0   acidophilus (RISAQUAD) CAPS capsule Take 1 capsule by mouth daily.     Alpha-Lipoic Acid 600 MG CAPS Take 1,200 mg by mouth in the morning.     Blood Glucose Monitoring Suppl (ACCU-CHEK GUIDE ME) w/Device KIT 1 kit by Does not apply route 3 (three) times daily. 1 kit 0   Cholecalciferol (VITAMIN D) 125 MCG (5000 UT) CAPS Take 5,000 Units by mouth daily.     glucose blood test strip USE AS DIRECTED 2 TIMES DAILY (Patient taking differently: USE AS  DIRECTED 2 TIMES DAILY) 180 strip 0   imatinib (GLEEVEC) 400 MG tablet Take 1 tablet (400 mg total) by mouth daily. 30 tablet 5   Insulin Pen Needle 32G X 4 MM MISC USE TO INJECT VICTOZA ONCE DAILY. 100 each 11   lisinopril (ZESTRIL) 20 MG tablet TAKE 1 TABLET (20 MG TOTAL) BY MOUTH DAILY. 90 tablet 0   methocarbamol (ROBAXIN) 500 MG tablet Take 1 tablet (500 mg total) by mouth every 6 (six) hours as needed for muscle spasms. 40 tablet 3   Misc Natural Products (GINSENG-RED CHINESE PO) Take 4,000 mg by mouth daily.     Multiple Vitamins-Minerals (MULTIVITAMIN WITH MINERALS) tablet Take 1 tablet by mouth daily.      OVER THE COUNTER MEDICATION Take 2,000 mg by mouth daily. Black Seed Oil     OVER THE COUNTER MEDICATION Take 1 capsule by mouth daily. Testosterone Support     topiramate (TOPAMAX) 100 MG tablet Take 1 tablet (100 mg total) by mouth 2 (two) times daily. 180 tablet 3   traMADol (ULTRAM) 50 MG tablet TAKE 1-2 TABLETS (50-100 MG TOTAL) BY MOUTH 3 (THREE) TIMES DAILY AS NEEDED FOR SEVERE PAIN. MAY CAUSE DROWSINESS. 60 tablet 0   [DISCONTINUED] DULoxetine (CYMBALTA) 60 MG capsule Take 1 capsule (60 mg total) by mouth 2 (two) times daily. (Patient not taking: No sig reported) 60 capsule 1   No current facility-administered medications on file prior to visit.    No Known Allergies  Family History  Problem Relation Age of Onset   Aneurysm Father    Drug abuse Sister    Multiple sclerosis Sister    Suicidality Maternal Aunt    Emphysema Maternal Grandfather    Diabetes Neg Hx     BP (!) 150/80 (BP Location: Right Arm, Patient Position: Sitting, Cuff Size: Normal)   Pulse 82   Ht $R'6\' 2"'xY$  (1.88 m)   Wt 232 lb 3.2 oz (105.3 kg)   SpO2 97%   BMI 29.81 kg/m    Review of Systems He has lost 25 lbs recently    Objective:   Physical Exam Pulses: dorsalis pedis intact bilat.   MSK: no deformity of the feet CV: 1+ bilat leg edema Skin:  no ulcer on the feet.  normal color and temp  on the feet.   Neuro: sensation is intact to touch on the feet, but severely decreased from normal.    Lab Results  Component Value Date   CREATININE 0.92 03/14/2021   BUN 19 03/14/2021   NA 133 (L) 03/14/2021   K 4.1 03/14/2021  CL 104 03/14/2021   CO2 21 (L) 03/14/2021   Lab Results  Component Value Date   HGBA1C 5.9 (A) 04/19/2021       Assessment & Plan:  Insulin-requiring type 2 DM: overcontrolled  Patient Instructions  check your blood sugar twice a day.  vary the time of day when you check, between before the 3 meals, and at bedtime.  also check if you have symptoms of your blood sugar being too high or too low.  please keep a record of the readings and bring it to your next appointment here (or you can bring the meter itself).  You can write it on any piece of paper.  please call us sooner if your blood sugar goes below 70, or if you have a lot of readings over 200.   I have sent a prescription to your pharmacy, to reduce the Lantus to 14 units each morning.   Please come back for a follow-up appointment in 2 months.

## 2021-04-19 NOTE — Patient Instructions (Addendum)
check your blood sugar twice a day.  vary the time of day when you check, between before the 3 meals, and at bedtime.  also check if you have symptoms of your blood sugar being too high or too low.  please keep a record of the readings and bring it to your next appointment here (or you can bring the meter itself).  You can write it on any piece of paper.  please call us sooner if your blood sugar goes below 70, or if you have a lot of readings over 200.   I have sent a prescription to your pharmacy, to reduce the Lantus to 14 units each morning.   Please come back for a follow-up appointment in 2 months.

## 2021-05-03 ENCOUNTER — Other Ambulatory Visit: Payer: Self-pay

## 2021-05-03 NOTE — Patient Outreach (Signed)
Care Coordination  05/03/2021  FRIEDRICH ALDRIDGE 1966/04/16 [id number]  05/03/2021 Name: Gerald Olson MRN: [id number] DOB: 05/26/1966  Referred by: Margo Common, PA-C Reason for referral : High Risk Managed Medicaid (Unsuccessful Telephone Outreach)   An unsuccessful telephone outreach was attempted today. The patient was referred to the case management team for assistance with care management and care coordination.    Follow Up Plan: A HIPAA compliant phone message was left for the patient providing contact information and requesting a return call.    [name] RN, BSN Community Care Coordinator [facility] Mobile: [phone]

## 2021-05-03 NOTE — Patient Instructions (Signed)
05/03/2021 Name: Gerald Olson MRN: [id number] DOB: 06/25/1966  Referred by: Margo Common, PA-C Reason for referral : High Risk Managed Medicaid (Unsuccessful Telephone Outreach)   An unsuccessful telephone outreach was attempted today. The patient was referred to the case management team for assistance with care management and care coordination.    Follow Up Plan: A HIPAA compliant phone message was left for the patient providing contact information and requesting a return call.    [name] RN, BSN Community Care Coordinator [place] [facility] Mobile: [phone]

## 2021-05-08 ENCOUNTER — Encounter (INDEPENDENT_AMBULATORY_CARE_PROVIDER_SITE_OTHER): Payer: Medicaid Other | Admitting: Ophthalmology

## 2021-05-08 ENCOUNTER — Other Ambulatory Visit (HOSPITAL_COMMUNITY): Payer: Self-pay

## 2021-05-09 ENCOUNTER — Other Ambulatory Visit: Payer: Self-pay

## 2021-05-09 NOTE — Patient Instructions (Signed)
Visit Information  Gerald Olson was given information about Medicaid Managed Care team care coordination services as a part of their Healthy [facility] Medicaid benefit. Gerald Olson verbally consented to engagement with the [facility] Managed Care team.   If you are experiencing a medical emergency, please call 911 or report to your local emergency department or urgent care.   If you have a non-emergency medical problem during routine business hours, please contact your provider's office and ask to speak with a nurse.   For questions related to your Healthy [facility] health plan, please call: [phone] or visit the homepage here: [email or web address]  If you would like to schedule transportation through your Healthy [facility] plan, please call the following number at least 2 days in advance of your appointment: [phone]  Call the [place] at [phone], at any time, 24 hours a day, 7 days a week. If you are in danger or need immediate medical attention call 911.  If you would like help to quit smoking, call 1-800-QUIT-NOW 9096657517) OR Espaol: 1-855-Djelo-Ya (4-462-863-8177) o para ms informacin haga clic aqu or Text READY to 200-400 to register via text  Gerald Olson - following are the goals we discussed in your visit today:   Goals Addressed             This Visit's Progress    RNCM - Chronic Back Pain Management       Timeframe:  Long-Range Goal Priority:  High Start Date:   05/09/2021                          Expected End Date: 09/06/2021         Patient Goals: Patient will self administer medications as prescribed Patient will attend all scheduled provider appointments Patient will call pharmacy for medication refills Patient will continue to perform ADL's independently Patient will continue to perform IADL's independently Patient will call provider office for new concerns or questions Patient will work  with BSW to address care coordination needs and will continue to work with the clinical team to address health care and disease management related needs.                      RNCM - CML Disease Progression Minimized or Managed       Timeframe:  Long-Range Goal Priority:  High Start Date:     05/09/2021                       Expected End Date:  09/06/2021     Patient Goals: Patient will self administer medications as prescribed Patient will attend all scheduled provider appointments Patient will call pharmacy for medication refills Patient will continue to perform ADL's independently Patient will continue to perform IADL's independently Patient will call provider office for new concerns or questions Patient will work with BSW to address care coordination needs and will continue to work with the clinical team to address health care and disease management related needs.                              RNCM - Glycemic Management & Monitored       Timeframe:  Long-Range Goal Priority:  High Start Date:     05/09/2021  Expected End Date:  09/06/2021  Patient Goals: Patient will self administer medications as prescribed Patient will attend all scheduled provider appointments Patient will call pharmacy for medication refills Patient will continue to perform ADL's independently Patient will continue to perform IADL's independently Patient will call provider office for new concerns or questions Patient will work with BSW to address care coordination needs and will continue to work with the clinical team to address health care and disease management related needs.                 RNCM - Hypertension Monitored and Managed       Timeframe:  Long-Range Goal Priority:  High Start Date:  05/09/2021                           Expected End Date:  09/06/2021  Patient Goals Patient will self administer medications as prescribed Patient will attend all scheduled provider  appointments Patient will call pharmacy for medication refills Patient will continue to perform ADL's independently Patient will continue to perform IADL's independently Patient will call provider office for new concerns or questions Patient will work with BSW to address care coordination needs and will continue to work with the clinical team to address health care and disease management related needs.                      Patient will  review today's visit information via My Chart.  The Managed Medicaid care management team will reach out to the patient again over the next 30 days.   [name] RN, BSN Community Care Coordinator [facility] Mobile: [phone]   Following is a copy of your plan of care:  Care Plan : [facility] Plan of Care  Updates made by [name], RN since 05/09/2021 12:00 AM     Problem: Chronic Disease Management and Care Coordination Needs for DM, HTN, CML and Back Pain      Long-Range Goal: Plan of Care for Chronic Disease management and Care Coordination Needs   Start Date: 05/09/2021  Expected End Date: 09/06/2021  Priority: High  Note:   Current Barriers:  Knowledge Deficits related to plan of care for management of HTN, DMII, and CML and Chronic Back Pain  Care Coordination needs related to Financial constraints related to paying back taxes on house, Limited social support, and Transportation  Chronic Disease Management support and education needs related to HTN, DMII, and CML and Chronic Back Pain Financial Constraints.  Transportation barriers Non-adherence to prescribed medication regimen No Advanced Directives in place  RNCM Clinical Goal(s):  Patient will verbalize understanding of plan for management of HTN, DMII, and CML and Chronic Back Pain verbalize basic understanding of HTN, DMII, and CML and Chronic Back Pain disease process and self health management plan  take all medications exactly as  prescribed and will call provider for medication related questions attend all scheduled medical appointments: Dr Loanne [name] on 06/19/2021, Oncologist on 06/22/2021 (most likely will reschedule due to a different medical appointment on the same day) and Neurology appointment in October 2022 demonstrate ongoing adherence to prescribed treatment plan for HTN, DMII, and CML and Chronic Back Pain as evidenced by daily monitoring and recording of CBG, adherence to ADA/ carb modified diet, adherence to prescribed medication regimen, contacting provider for new or worsened symptoms or questions  demonstrate ongoing health management independence  continue to work with RN  Care Manager to address care management and care coordination needs related to HTN, DMII, and CML and Chronic Back Pain  work with pharmacist to address complex medication regimen  related to HTN, DMII, and CML and Chronic Back Pain work with Education officer, museum to address financial constraints related to overdue payment of back taxes and potential risk of losing home, Limited social support, Transportation, and Counseling for anxiety/stress related to the management of HTN, DMII, and CML and Chronic Back Pain work with Data processing manager care guide to address needs related to financial constraints related to overdue taxes, Limited social support, and Transportation for medical appointments collaborate with the care management team towards completion of advanced directives  through collaboration with Consulting civil engineer, provider, and care team.   Interventions:   Inter-disciplinary care team collaboration (see longitudinal plan of care) Evaluation of current treatment plan related to  self management and patient's adherence to plan as established by provider   Diabetes:  (Status: New goal.) Lab Results  Component Value Date   HGBA1C 5.9 (A) 04/19/2021  Assessed patient's understanding of A1c goal: <7% Provided education to patient about basic DM  disease process; Reviewed medications with patient and discussed importance of medication adherence;        Counseled on importance of regular laboratory monitoring as prescribed;        Discussed plans with patient for ongoing care management follow up and provided patient with direct contact information for care management team;      Reviewed scheduled/upcoming provider appointments including: See Above under Clinical Goals;         Advised patient, providing education and rationale, to check cbg 2 times/day and record        call provider for findings outside established parameters;       Referral made to pharmacy team for assistance with complex medication regimen;       Referral made to social work team for assistance with counseling with LCSW;      Review of patient status, including review of consultants reports, relevant laboratory and other test results, and medications completed;       Screening for signs and symptoms of depression related to chronic disease state;        Assessed social determinant of health barriers;         Chronic Myeloid Leukemia (CML)  (Status: New goal.) Evaluation of current treatment plan related to  [facility] , Financial constraints related to back taxes payment for home, Limited social support, and Transportation self-management and patient's adherence to plan as established by provider. Discussed plans with patient for ongoing care management follow up and provided patient with direct contact information for care management team Reviewed medications with patient and discussed the importance of taking all medications as prescribed; Social Work referral for [employer] counseling regarding anxiety/stress; Pharmacy referral for complex medication regimen; Screening for signs and symptoms of depression related to chronic disease state;  Assessed social determinant of health barriers;   Hypertension: (Status: New goal.) Last practice recorded BP readings:  BP Readings  from Last 3 Encounters:  04/19/21 (!) 150/80  03/01/21 (!) 139/95  02/07/21 127/89  Most recent eGFR/CrCl: No results found for: EGFR  No components found for: CRCL  Evaluation of current treatment plan related to hypertension self management and patient's adherence to plan as established by provider;   Reviewed medications with patient and discussed importance of compliance;  Counseled on the importance of exercise goals with target of 150 minutes per  week but understand how patient's increased fatigue impacts the amount of exercise he can do. Discussed plans with patient for ongoing care management follow up and provided patient with direct contact information for care management team; Advised patient, providing education and rationale, to monitor blood pressure 2 to 3 times/week and record, calling PCP for findings outside established parameters;  Reviewed scheduled/upcoming provider appointments including: See Clinical Goals Above. Screening for signs and symptoms of depression related to chronic disease state;  Assessed social determinant of health barriers;   Pain:  (Status: New goal.) Pain assessment performed Medications reviewed Reviewed provider established plan for pain management; Discussed importance of adherence to all scheduled medical appointments;  Patient Goals/Self-Care Activities: Patient will self administer medications as prescribed Patient will attend all scheduled provider appointments Patient will call pharmacy for medication refills Patient will continue to perform ADL's independently Patient will continue to perform IADL's independently Patient will call provider office for new concerns or questions Patient will work with BSW to address care coordination needs and will continue to work with the clinical team to address health care and disease management related needs.

## 2021-05-09 NOTE — Patient Outreach (Signed)
Medicaid Managed Care   Nurse Care Manager Note  05/09/2021 Name:  Gerald Olson MRN:  [id number] DOB:  09/01/1965  Gerald Olson is an 55 y.o. year old male who is a primary patient of Chrismon, Vickki Muff, PA-C.  The [facility] Managed Care Coordination team was consulted for assistance with:    HTN DMII CML and Chronic Back Pain  Gerald Olson was given information about Medicaid Managed Care Coordination team services today. Gerald Olson Patient agreed to services and verbal consent obtained.  Engaged with patient by telephone for initial visit in response to provider referral for case management and/or care coordination services.   Assessments/Interventions:  Review of past medical history, allergies, medications, health status, including review of consultants reports, laboratory and other test data, was performed as part of comprehensive evaluation and provision of chronic care management services.  SDOH (Social Determinants of Health) assessments and interventions performed: SDOH Interventions    Flowsheet Row Most Recent Value  SDOH Interventions   SDOH Interventions for the Following Domains Financial Strain, Housing, Stress, Transportation  Food Insecurity Interventions Intervention Not Indicated  Financial Strain Interventions Other (Comment)  [Referral to [employer] for possible community resources]  Housing Interventions Other (Comment)  [Referral to [employer] for community resources to help patient not lose his home.]  Physical Activity Interventions Intervention Not Indicated  Stress Interventions Provide Counseling  Social Connections Interventions Intervention Not Indicated  Transportation Interventions Other (Comment)  [referral to BSW for community resources]       Care Plan  No Known Allergies  Medications Reviewed Today     Reviewed by [name], RN (Case Manager) on 05/09/21 at 1731  Med List Status: <None>   Medication Order Taking? Sig Documenting Provider Last  Dose Status Informant  Accu-Chek FastClix Lancets MISC [id number] Yes USE AS DIRECTED 2 TIMES DAILY Flinchum, Kelby Aline, FNP Taking Active Self  acidophilus (RISAQUAD) CAPS capsule [id number] Yes Take 1 capsule by mouth daily. [provider] Taking Active Self  Alpha-Lipoic Acid 600 MG CAPS [id number] No Take 1,200 mg by mouth in the morning.  Patient not taking: Reported on 05/09/2021   [provider] Not Taking Active Self           Med Note [facility], [name]   Tue May 09, 2021  4:00 PM) Patient taking 2400 mg daily  Blood Glucose Monitoring Suppl (ACCU-CHEK GUIDE ME) w/Device KIT [id number] Yes 1 kit by Does not apply route 3 (three) times daily. Flinchum, Kelby Aline, FNP Taking Active Self  calcium citrate (CALCITRATE - DOSED IN MG ELEMENTAL CALCIUM) 950 (200 Ca) MG tablet [id number] Yes Take 630 mg of elemental calcium by mouth daily. [provider] Taking Active Self  Cholecalciferol (VITAMIN D) 125 MCG (5000 UT) CAPS [id number] Yes Take 5,000 Units by mouth daily. [provider] Taking Active    Patient not taking:   Discontinued 08/09/20 1309 (Patient Preference)   glucose blood test strip [id number] Yes USE AS DIRECTED 2 TIMES DAILY  Patient taking differently: USE AS DIRECTED 2 TIMES DAILY   Flinchum, Kelby Aline, FNP Taking Active   imatinib (GLEEVEC) 400 MG tablet [id number] Yes Take 1 tablet (400 mg total) by mouth daily. [name], RPH-CPP Taking Active Self  insulin glargine (LANTUS SOLOSTAR) 100 UNIT/ML Solostar Pen [id number] Yes Inject 14 Units into the skin every morning. [name], MD Taking Active   Insulin Pen Needle 32G X 4 MM MISC [id number] Yes USE TO INJECT VICTOZA ONCE  DAILY. [name], MD Taking Active Self  lisinopril (ZESTRIL) 20 MG tablet [id number] No TAKE 1 TABLET (20 MG TOTAL) BY MOUTH DAILY.  Patient not taking: Reported on 05/09/2021   ChrismonDriscilla Grammes Not Taking Active   magnesium 30 MG tablet [id number]  Yes Take 500 mg by mouth daily. [provider] Taking Active Self  methocarbamol (ROBAXIN) 500 MG tablet [id number] Yes Take 1 tablet (500 mg total) by mouth every 6 (six) hours as needed for muscle spasms. Kristeen Miss, MD Taking Active   Misc Natural Products [facility] CHINESE PO) [id number] Yes Take 4,000 mg by mouth daily. [provider] Taking Active Self  Multiple Vitamins-Minerals (MULTIVITAMIN WITH MINERALS) tablet [id number] Yes Take 1 tablet by mouth daily.  [provider] Taking Active Self  OVER THE COUNTER MEDICATION [id number] Yes Take 2,000 mg by mouth daily. Black Seed Oil [provider] Taking Active Self  OVER THE COUNTER MEDICATION [id number] Yes Take 1 capsule by mouth daily. Testosterone Support [provider] Taking Active Self  OVER THE COUNTER MEDICATION [id number] Yes Take 1,500 mg by mouth daily. Acetyl L-carnitine [provider] Taking Active Self  OVER THE COUNTER MEDICATION [id number] Yes Take 2,000 mg by mouth daily. Marshmallow supplement [provider] Taking Active Self  topiramate (TOPAMAX) 100 MG tablet [id number] Yes Take 1 tablet (100 mg total) by mouth 2 (two) times daily. Frann Rider, NP Taking Active   traMADol (ULTRAM) 50 MG tablet [id number] Yes TAKE 1-2 TABLETS (50-100 MG TOTAL) BY MOUTH 3 (THREE) TIMES DAILY AS NEEDED FOR SEVERE PAIN. MAY CAUSE DROWSINESS. 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 08/09/2020   No-show for appointment 08/04/2020   Occult blood in stools 06/15/2020   Mesenteric lymphadenopathy 06/14/2020   Splenomegaly- mild  06/14/2020   Screening for blood or protein in urine 06/14/2020   Elevated blood pressure reading 06/14/2020   History of cannabis abuse 06/14/2020   Recurrent major depressive disorder, in partial remission ([place]) 06/09/2020   Anxiety 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 06/09/2020   Alcohol use disorder, moderate, dependence ([place]) 06/09/2020   Spinal stenosis of cervical region 06/03/2020   Frequent falls 05/23/2020   Foraminal stenosis of cervical region 05/09/2020   Diabetic polyneuropathy    ADHD (attention deficit hyperactivity disorder)    Generalized anxiety disorder    Major depressive disorder    Cervical spondylosis 09/22/2019   Low back pain 07/28/2019   Essential hypertension, benign 62/56/3893   Alcoholic cirrhosis of liver 06/02/2018   Type 2 diabetes mellitus with hyperglycemia 04/25/2018   Alcohol use disorder, mild, abuse 04/25/2018    Conditions to be addressed/monitored per PCP order:  HTN, DMII, and CML and Chronic Back Pain  Care Plan : RNCM Plan of Care  Updates made by [name], RN since 05/09/2021 12:00 AM     Problem: Chronic Disease Management and  Care Coordination Needs for DM, HTN, CML and Back Pain      Long-Range Goal: Plan of Care for Chronic Disease management and Care Coordination Needs   Start Date: 05/09/2021  Expected End Date: 09/06/2021  Priority: High  Note:   Current Barriers:  Knowledge Deficits related to plan of care for management of HTN, DMII, and CML and Chronic Back Pain  Care Coordination needs related to Financial constraints related to paying back taxes on house, Limited social support, and Transportation  Chronic Disease Management support and education needs related to HTN, DMII, and CML  and Chronic Back Pain Financial Constraints.  Transportation barriers Non-adherence to prescribed medication regimen No Advanced Directives in place  RNCM Clinical Goal(s):  Patient will verbalize understanding of plan for management of HTN, DMII, and CML and Chronic Back Pain verbalize basic understanding of HTN, DMII, and CML and Chronic Back Pain disease process and self health management plan  take all medications exactly as prescribed and will call provider for medication related questions attend all scheduled medical appointments: Dr Loanne [name] on 06/19/2021, Oncologist on 06/22/2021 (most likely will reschedule due to a different medical appointment on the same day) and Neurology appointment in October 2022 demonstrate ongoing adherence to prescribed treatment plan for HTN, DMII, and CML and Chronic Back Pain as evidenced by daily monitoring and recording of CBG, adherence to ADA/ carb modified diet, adherence to prescribed medication regimen, contacting provider for new or worsened symptoms or questions  demonstrate ongoing health management independence  continue to work with RN Care Manager to address care management and care coordination needs related to HTN, DMII, and CML and Chronic Back Pain  work with pharmacist to address complex medication regimen  related to HTN, DMII, and CML and Chronic Back Pain work with Education officer, museum to address financial constraints related to overdue payment of back taxes and potential risk of losing home, Limited social support, Transportation, and Counseling for anxiety/stress related to the management of HTN, DMII, and CML and Chronic Back Pain work with Data processing manager care guide to address needs related to financial constraints related to overdue taxes, Limited social support, and Transportation for medical appointments collaborate with the care management team towards completion of advanced directives  through collaboration with Consulting civil engineer,  provider, and care team.   Interventions:   Inter-disciplinary care team collaboration (see longitudinal plan of care) Evaluation of current treatment plan related to  self management and patient's adherence to plan as established by provider   Diabetes:  (Status: New goal.) Lab Results  Component Value Date   HGBA1C 5.9 (A) 04/19/2021  Assessed patient's understanding of A1c goal: <7% Provided education to patient about basic DM disease process; Reviewed medications with patient and discussed importance of medication adherence;        Counseled on importance of regular laboratory monitoring as prescribed;        Discussed plans with patient for ongoing care management follow up and provided patient with direct contact information for care management team;      Reviewed scheduled/upcoming provider appointments including: See Above under Clinical Goals;         Advised patient, providing education and rationale, to check cbg 2 times/day and record        call provider for findings outside established parameters;       Referral made to pharmacy team for assistance with complex medication regimen;       Referral made to social work team for assistance with  counseling with LCSW;      Review of patient status, including review of consultants reports, relevant laboratory and other test results, and medications completed;       Screening for signs and symptoms of depression related to chronic disease state;        Assessed social determinant of health barriers;         Chronic Myeloid Leukemia (CML)  (Status: New goal.) Evaluation of current treatment plan related to  [facility] , Financial constraints related to back taxes payment for home, Limited social support, and Transportation self-management and patient's adherence to plan as established by provider. Discussed plans with patient for ongoing care management follow up and provided patient with direct contact information for care management  team Reviewed medications with patient and discussed the importance of taking all medications as prescribed; Social Work referral for [facility] counseling regarding anxiety/stress; Pharmacy referral for complex medication regimen; Screening for signs and symptoms of depression related to chronic disease state;  Assessed social determinant of health barriers;   Hypertension: (Status: New goal.) Last practice recorded BP readings:  BP Readings from Last 3 Encounters:  04/19/21 (!) 150/80  03/01/21 (!) 139/95  02/07/21 127/89  Most recent eGFR/CrCl: No results found for: EGFR  No components found for: CRCL  Evaluation of current treatment plan related to hypertension self management and patient's adherence to plan as established by provider;   Reviewed medications with patient and discussed importance of compliance;  Counseled on the importance of exercise goals with target of 150 minutes per week but understand how patient's increased fatigue impacts the amount of exercise he can do. Discussed plans with patient for ongoing care management follow up and provided patient with direct contact information for care management team; Advised patient, providing education and rationale, to monitor blood pressure 2 to 3 times/week and record, calling PCP for findings outside established parameters;  Reviewed scheduled/upcoming provider appointments including: See Clinical Goals Above. Screening for signs and symptoms of depression related to chronic disease state;  Assessed social determinant of health barriers;   Pain:  (Status: New goal.) Pain assessment performed Medications reviewed Reviewed provider established plan for pain management; Discussed importance of adherence to all scheduled medical appointments;  Patient Goals/Self-Care Activities: Patient will self administer medications as prescribed Patient will attend all scheduled provider appointments Patient will call pharmacy for medication  refills Patient will continue to perform ADL's independently Patient will continue to perform IADL's independently Patient will call provider office for new concerns or questions Patient will work with BSW to address care coordination needs and will continue to work with the clinical team to address health care and disease management related needs.         Follow Up:  Patient agrees to Care Plan and Follow-up.  Plan: The Managed Medicaid care management team will reach out to the patient again over the next 30 days.  Date/time of next scheduled RN care management/care coordination outreach:  06/06/2021 at 3:00pm  [name] RN, [place] Coordinator [facility] Mobile: [phone]

## 2021-05-15 ENCOUNTER — Other Ambulatory Visit: Payer: Self-pay | Admitting: Licensed Clinical Social Worker

## 2021-05-16 ENCOUNTER — Other Ambulatory Visit: Payer: Self-pay

## 2021-05-16 ENCOUNTER — Other Ambulatory Visit (HOSPITAL_COMMUNITY): Payer: Self-pay

## 2021-05-16 NOTE — Patient Instructions (Signed)
Visit Information  Gerald Olson was given information about Medicaid Managed Care team care coordination services as a part of their Healthy [facility] Medicaid benefit. Gerald Olson verbally consented to engagement with the [facility] Managed Care team.   If you are experiencing a medical emergency, please call 911 or report to your local emergency department or urgent care.   If you have a non-emergency medical problem during routine business hours, please contact your provider's office and ask to speak with a nurse.   For questions related to your Healthy [facility] health plan, please call: [phone] or visit the homepage here: [email or web address]  If you would like to schedule transportation through your Healthy [facility] plan, please call the following number at least 2 days in advance of your appointment: [phone]  Call the [place] at [phone], at any time, 24 hours a day, 7 days a week. If you are in danger or need immediate medical attention call 911.  If you would like help to quit smoking, call 1-800-QUIT-NOW (430) 707-6766) OR Espaol: 1-855-Djelo-Ya (5-747-340-3709) o para ms informacin haga clic aqu or Text READY to 200-400 to register via text  Gerald Olson - following are the goals we discussed in your visit today:   Goals Addressed   None     Social Worker will follow up with patient in 32.   [name], BSW, [place] Managed Medicaid Team  [phone]   Following is a copy of your plan of care:  There are no care plans that you recently modified to display for this patient.

## 2021-05-16 NOTE — Patient Outreach (Signed)
Medicaid Managed Care Social Work Note  05/16/2021 Name:  LAREN Olson MRN:  [id number] DOB:  [birth year]  Gerald Olson is an 55 y.o. year old male who is a primary patient of Chrismon, Vickki Muff, PA-C.  The [facility] Managed Care Coordination team was consulted for assistance with:  Community Resources   Mr. Sheller was given information about Medicaid Managed Care Coordination team services today. Gerald Olson Patient agreed to services and verbal consent obtained.  Engaged with patient  for by telephone forinitial visit in response to referral for case management and/or care coordination services.   Assessments/Interventions:  Review of past medical history, allergies, medications, health status, including review of consultants reports, laboratory and other test data, was performed as part of comprehensive evaluation and provision of chronic care management services.  SDOH: (Social Determinant of Health) assessments and interventions performed: BSW contacted patient regarding referral received stating that patient needed assistance with property taxes. After researching BSW was able to locate the [facility] Tax program. Patient stated that he already had the application and it was completed. BSW provided patient with the address to send application in. Patient stated he does not need transportation for appointments. Patient states he does have 2 cars.   Advanced Directives Status:  Not addressed in this encounter.  Care Plan                 No Known Allergies  Medications Reviewed Today     Reviewed by [name], RN (Case Manager) on 05/09/21 at 1731  Med List Status: <None>   Medication Order Taking? Sig Documenting Provider Last Dose Status Informant  Accu-Chek FastClix Lancets MISC [id number] Yes USE AS DIRECTED 2 TIMES DAILY Flinchum, Kelby Aline, FNP Taking Active Self  acidophilus (RISAQUAD) CAPS capsule [id number] Yes Take 1 capsule by mouth daily. [provider] Taking Active Self  Alpha-Lipoic Acid 600 MG CAPS [id number] No Take 1,200 mg by mouth in the morning.  Patient not taking: Reported on 05/09/2021   [provider] Not Taking Active Self           Med Note [facility], [name]   Tue May 09, 2021  4:00 PM) Patient taking 2400 mg daily  Blood Glucose Monitoring Suppl (ACCU-CHEK GUIDE ME) w/Device KIT [id number] Yes 1 kit by Does not apply route 3 (three) times daily. Flinchum, Kelby Aline, FNP Taking Active Self  calcium citrate (CALCITRATE - DOSED IN MG ELEMENTAL CALCIUM) 950 (200 Ca) MG tablet [id number] Yes Take 630 mg of elemental calcium by mouth daily. [provider] Taking Active Self  Cholecalciferol (VITAMIN D) 125 MCG (5000 UT) CAPS [id number] Yes Take 5,000 Units by mouth daily. [provider] Taking Active    Patient not taking:   Discontinued 08/09/20 1309 (Patient Preference)   glucose blood test strip [id number] Yes USE AS DIRECTED 2 TIMES DAILY  Patient taking differently: USE AS DIRECTED 2 TIMES DAILY   Flinchum, Kelby Aline, FNP Taking Active   imatinib (GLEEVEC) 400 MG tablet [id number] Yes Take 1 tablet (400 mg total) by mouth daily. [name], RPH-CPP Taking Active Self  insulin glargine (LANTUS SOLOSTAR) 100 UNIT/ML Solostar Pen [id number] Yes Inject 14 Units into the skin every morning. [name], MD Taking Active   Insulin Pen Needle 32G X 4 MM MISC [id number] Yes USE TO INJECT VICTOZA ONCE DAILY. [name], MD Taking Active Self  lisinopril (ZESTRIL) 20 MG tablet [id number] No TAKE 1 TABLET (20 MG TOTAL)  BY MOUTH DAILY.  Patient not taking: Reported on 05/09/2021   ChrismonDriscilla Grammes Not Taking Active   magnesium 30 MG tablet [id number] Yes Take 500 mg by mouth daily. [provider] Taking Active Self  methocarbamol (ROBAXIN) 500 MG tablet [id number] Yes Take 1 tablet (500 mg total) by mouth every 6 (six) hours as needed for muscle spasms. Kristeen Miss, MD Taking Active    Misc Natural Products [facility] CHINESE PO) [id number] Yes Take 4,000 mg by mouth daily. [provider] Taking Active Self  Multiple Vitamins-Minerals (MULTIVITAMIN WITH MINERALS) tablet [id number] Yes Take 1 tablet by mouth daily.  [provider] Taking Active Self  OVER THE COUNTER MEDICATION [id number] Yes Take 2,000 mg by mouth daily. Black Seed Oil [provider] Taking Active Self  OVER THE COUNTER MEDICATION [id number] Yes Take 1 capsule by mouth daily. Testosterone Support [provider] Taking Active Self  OVER THE COUNTER MEDICATION [id number] Yes Take 1,500 mg by mouth daily. Acetyl L-carnitine [provider] Taking Active Self  OVER THE COUNTER MEDICATION [id number] Yes Take 2,000 mg by mouth daily. Marshmallow supplement [provider] Taking Active Self  topiramate (TOPAMAX) 100 MG tablet [id number] Yes Take 1 tablet (100 mg total) by mouth 2 (two) times daily. Frann Rider, NP Taking Active   traMADol (ULTRAM) 50 MG tablet [id number] Yes TAKE 1-2 TABLETS (50-100 MG TOTAL) BY MOUTH 3 (THREE) TIMES DAILY AS NEEDED FOR SEVERE PAIN. MAY CAUSE DROWSINESS. 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 08/09/2020   No-show for appointment 08/04/2020   Occult blood in stools 06/15/2020   Mesenteric lymphadenopathy 06/14/2020   Splenomegaly- mild  06/14/2020    Screening for blood or protein in urine 06/14/2020   Elevated blood pressure reading 06/14/2020   History of cannabis abuse 06/14/2020   Recurrent major depressive disorder, in partial remission ([place]) 06/09/2020   Anxiety 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 06/09/2020   Alcohol use disorder, moderate, dependence ([place]) 06/09/2020   Spinal stenosis of cervical region 06/03/2020   Frequent falls 05/23/2020   Foraminal stenosis of cervical region 05/09/2020   Diabetic polyneuropathy    ADHD (attention deficit hyperactivity disorder)    Generalized anxiety disorder    Major depressive disorder    Cervical spondylosis 09/22/2019   Low back pain 07/28/2019   Essential hypertension, benign 99/35/7017   Alcoholic cirrhosis of liver 06/02/2018   Type 2 diabetes mellitus with hyperglycemia 04/25/2018   Alcohol use disorder, mild, abuse 04/25/2018    Conditions to be addressed/monitored per PCP order:   assistance with foreclosure  There are no care plans that you recently modified to display for this patient.   Follow up:  Patient agrees to Care Plan and Follow-up.  Plan: The Managed Medicaid care management team will reach out to the patient again over the next 30 days.  Date/time of next scheduled Social Work  care management/care coordination outreach:  06/15/21  [name], Arita Miss, [place] Managed Medicaid Team  [phone]

## 2021-05-17 ENCOUNTER — Encounter: Payer: Self-pay | Admitting: Endocrinology

## 2021-05-17 NOTE — Patient Instructions (Signed)
[name] ,   The [facility] Managed Care Team is available to provide assistance to you with your healthcare needs at no cost and as a benefit of your [facility] Health plan.   Thank you,  Eula Fried, BSW, MSW, [employer] Managed Medicaid [email or web address] [email or web address] Phone: [phone]

## 2021-05-17 NOTE — Patient Outreach (Addendum)
[facility]) Care Management  [facility] Social Work  05/17/2021  Gerald Olson 24-Aug-1966 [id number]   Encounter Medications:  Outpatient Encounter Medications as of 05/15/2021  Medication Sig Note   Accu-Chek FastClix Lancets MISC USE AS DIRECTED 2 TIMES DAILY    acidophilus (RISAQUAD) CAPS capsule Take 1 capsule by mouth daily.    Alpha-Lipoic Acid 600 MG CAPS Take 1,200 mg by mouth in the morning. (Patient not taking: Reported on 05/09/2021) 05/09/2021: Patient taking 2400 mg daily   Blood Glucose Monitoring Suppl (ACCU-CHEK GUIDE ME) w/Device KIT 1 kit by Does not apply route 3 (three) times daily.    calcium citrate (CALCITRATE - DOSED IN MG ELEMENTAL CALCIUM) 950 (200 Ca) MG tablet Take 630 mg of elemental calcium by mouth daily.    Cholecalciferol (VITAMIN D) 125 MCG (5000 UT) CAPS Take 5,000 Units by mouth daily.    glucose blood test strip USE AS DIRECTED 2 TIMES DAILY (Patient taking differently: USE AS DIRECTED 2 TIMES DAILY)    imatinib (GLEEVEC) 400 MG tablet Take 1 tablet (400 mg total) by mouth daily.    insulin glargine (LANTUS SOLOSTAR) 100 UNIT/ML Solostar Pen Inject 14 Units into the skin every morning.    Insulin Pen Needle 32G X 4 MM MISC USE TO INJECT VICTOZA ONCE DAILY.    lisinopril (ZESTRIL) 20 MG tablet TAKE 1 TABLET (20 MG TOTAL) BY MOUTH DAILY. (Patient not taking: Reported on 05/09/2021)    magnesium 30 MG tablet Take 500 mg by mouth daily.    methocarbamol (ROBAXIN) 500 MG tablet Take 1 tablet (500 mg total) by mouth every 6 (six) hours as needed for muscle spasms.    Misc Natural Products (GINSENG-RED CHINESE PO) Take 4,000 mg by mouth daily.    Multiple Vitamins-Minerals (MULTIVITAMIN WITH MINERALS) tablet Take 1 tablet by mouth daily.     OVER THE COUNTER MEDICATION Take 2,000 mg by mouth daily. Black Seed Oil    OVER THE COUNTER MEDICATION Take 1 capsule by mouth daily. Testosterone Support    OVER THE COUNTER MEDICATION Take 1,500 mg by mouth  daily. Acetyl L-carnitine    OVER THE COUNTER MEDICATION Take 2,000 mg by mouth daily. Marshmallow supplement    topiramate (TOPAMAX) 100 MG tablet Take 1 tablet (100 mg total) by mouth 2 (two) times daily.    traMADol (ULTRAM) 50 MG tablet TAKE 1-2 TABLETS (50-100 MG TOTAL) BY MOUTH 3 (THREE) TIMES DAILY AS NEEDED FOR SEVERE PAIN. MAY CAUSE DROWSINESS.    No facility-administered encounter medications on file as of 05/15/2021.    Functional Status:  In your present state of health, do you have any difficulty performing the following activities: 01/18/2021 01/18/2021  Hearing? N -  Vision? N -  Difficulty concentrating or making decisions? N -  Walking or climbing stairs? Y -  Dressing or bathing? N -  Doing errands, shopping? - N  Some recent data might be hidden    Fall/Depression Screening:  PHQ 2/9 Scores 02/07/2021 10/26/2020 09/14/2020 06/15/2020 05/23/2020 07/01/2019 02/24/2019  PHQ - 2 Score _0 0 3  PHQ- 9 Score 5 - _1 0 15  Some encounter information is confidential and restricted. Go to Review Flowsheets activity to see all data.    Assessment:  Care Plan There are no care plans that you recently modified to display for this patient.    Goals Addressed   None    Patient reports that he would like to  reschedule his appointment after [facility] LCSW sent secure text message and made several calls to patient and then was unable to answer his reutrn call on 05/15/21. As a part of your Medicaid benefit, you are eligible for care management and care coordination services at no cost or copay. I was unable to reach you by phone today but would be happy to help you with your health related needs. Please feel free to call me @ [phone].    A member of the Managed Medicaid care management team will reach out to you again over the next 7-14 days.   [facility] LCSW will follow up on 05/23/21.  Eula Fried, BSW, MSW, [employer] Managed Medicaid [email or web address] [email or web address] Phone: [phone]

## 2021-05-18 ENCOUNTER — Other Ambulatory Visit (HOSPITAL_COMMUNITY): Payer: Self-pay

## 2021-05-23 ENCOUNTER — Other Ambulatory Visit: Payer: Self-pay | Admitting: Licensed Clinical Social Worker

## 2021-05-24 NOTE — Patient Instructions (Signed)
Visit Information  Gerald Olson was given information about Medicaid Managed Care team care coordination services as a part of their Healthy [facility] Medicaid benefit. Gerald Olson verbally consented to engagement with the [facility] Managed Care team.   If you are experiencing a medical emergency, please call 911 or report to your local emergency department or urgent care.   If you have a non-emergency medical problem during routine business hours, please contact your provider's office and ask to speak with a nurse.   For questions related to your Healthy [facility] health plan, please call: [phone] or visit the homepage here: [email or web address]  If you would like to schedule transportation through your Healthy [facility] plan, please call the following number at least 2 days in advance of your appointment: [phone]  Call the [place] at [phone], at any time, 24 hours a day, 7 days a week. If you are in danger or need immediate medical attention call 911.  If you would like help to quit smoking, call 1-800-QUIT-NOW 9867707857) OR Espaol: 1-855-Djelo-Ya (2-831-517-6160) o para ms informacin haga clic aqu or Text READY to 200-400 to register via text  Gerald Olson - following are the goals we discussed in your visit today:   Goals Addressed             This Visit's Progress    Track and Manage My Symptoms-Depression       Timeframe:  Long-Range Goal Priority:  High Start Date:  05/23/21                            Expected End Date:  ongoing                      Follow Up Date 06/16/21    - avoid negative self-talk - develop a personal safety plan - develop a plan to deal with triggers like holidays, anniversaries - exercise at least 2 to 3 times per week - have a plan for how to handle bad days - journal feelings and what helps to feel better or worse - spend time or talk with others at least 2 to 3 times  per week - spend time or talk with others every day - watch for early signs of feeling worse - write in journal every day    Why is this important?   Keeping track of your progress will help your treatment team find the right mix of medicine and therapy for you.  Write in your journal every day.  Day-to-day changes in depression symptoms are normal. It may be more helpful to check your progress at the end of each week instead of every day.     Notes:        Gerald Olson, BSW, MSW, [employer] Managed Medicaid LCSW [email or web address] Phone: [phone]

## 2021-05-24 NOTE — Patient Outreach (Signed)
Medicaid Managed Care Social Work Note  05/24/2021 Name:  Gerald Olson MRN:  [id number] DOB:  05/26/1966  Gerald Olson is an 55 y.o. year old male who is a primary patient of Chrismon, Vickki Muff, PA-C.  The Medicaid Managed Care Coordination team was consulted for assistance with:  Gerald Olson and Resources  [name] was given information about Medicaid Managed Care Coordination team services today. Gerald Olson Patient agreed to services and verbal consent obtained.  Engaged with patient  for by telephone forinitial visit in response to referral for case management and/or care coordination services.   Assessments/Interventions:  Review of past medical history, allergies, medications, health status, including review of consultants reports, laboratory and other test data, was performed as part of comprehensive evaluation and provision of chronic care management services.  SDOH: (Social Determinant of Health) assessments and interventions performed: SDOH Interventions    Flowsheet Row Most Recent Value  SDOH Interventions   Stress Interventions Provide Counseling  Depression Interventions/Treatment  Currently on Treatment       Advanced Directives Status:  See Care Plan for related entries.  Care Plan                 No Known Allergies  Medications Reviewed Today     Reviewed by [name], RN (Case Manager) on 05/09/21 at 1731  Med List Status: <None>   Medication Order Taking? Sig Documenting Provider Last Dose Status Informant  Accu-Chek FastClix Lancets MISC [id number] Yes USE AS DIRECTED 2 TIMES DAILY Flinchum, Kelby Aline, FNP Taking Active Self  acidophilus (RISAQUAD) CAPS capsule [id number] Yes Take 1 capsule by mouth daily. [provider] Taking Active Self  Alpha-Lipoic Acid 600 MG CAPS [id number] No Take 1,200 mg by mouth in the morning.  Patient not taking: Reported on 05/09/2021   [provider] Not Taking Active Self            Med Note [name], [name]   Tue May 09, 2021  4:00 PM) Patient taking 2400 mg daily  Blood Glucose Monitoring Suppl (ACCU-CHEK GUIDE ME) w/Device KIT [id number] Yes 1 kit by Does not apply route 3 (three) times daily. Flinchum, Kelby Aline, FNP Taking Active Self  calcium citrate (CALCITRATE - DOSED IN MG ELEMENTAL CALCIUM) 950 (200 Ca) MG tablet [id number] Yes Take 630 mg of elemental calcium by mouth daily. [provider] Taking Active Self  Cholecalciferol (VITAMIN D) 125 MCG (5000 UT) CAPS [id number] Yes Take 5,000 Units by mouth daily. [provider] Taking Active    Patient not taking:   Discontinued 08/09/20 1309 (Patient Preference)   glucose blood test strip [id number] Yes USE AS DIRECTED 2 TIMES DAILY  Patient taking differently: USE AS DIRECTED 2 TIMES DAILY   Flinchum, Kelby Aline, FNP Taking Active   imatinib (Gerald Olson) 400 MG tablet [id number] Yes Take 1 tablet (400 mg total) by mouth daily. [name], RPH-CPP Taking Active Self  insulin glargine (LANTUS SOLOSTAR) 100 UNIT/ML Solostar Pen [id number] Yes Inject 14 Units into the skin every morning. [name], MD Taking Active   Insulin Pen Needle 32G X 4 MM MISC [id number] Yes USE TO INJECT VICTOZA ONCE DAILY. [name], MD Taking Active Self  lisinopril (ZESTRIL) 20 MG tablet [id number] No TAKE 1 TABLET (20 MG TOTAL) BY MOUTH DAILY.  Patient not taking: Reported on 05/09/2021   Tania Ade Not Taking Active   magnesium 30 MG tablet [id number] Yes Take 500 mg by  mouth daily. [provider] Taking Active Self  methocarbamol (ROBAXIN) 500 MG tablet [id number] Yes Take 1 tablet (500 mg total) by mouth every 6 (six) hours as needed for muscle spasms. Kristeen Miss, MD Taking Active   Misc Natural Products [facility] CHINESE PO) [id number] Yes Take 4,000 mg by mouth daily. [provider] Taking Active Self  Multiple Vitamins-Minerals (MULTIVITAMIN WITH MINERALS) tablet [id number]  Yes Take 1 tablet by mouth daily.  [provider] Taking Active Self  OVER THE COUNTER MEDICATION [id number] Yes Take 2,000 mg by mouth daily. Black Seed Oil [provider] Taking Active Self  OVER THE COUNTER MEDICATION [id number] Yes Take 1 capsule by mouth daily. Testosterone Support [provider] Taking Active Self  OVER THE COUNTER MEDICATION [id number] Yes Take 1,500 mg by mouth daily. Acetyl L-carnitine [provider] Taking Active Self  OVER THE COUNTER MEDICATION [id number] Yes Take 2,000 mg by mouth daily. Marshmallow supplement [provider] Taking Active Self  topiramate (TOPAMAX) 100 MG tablet [id number] Yes Take 1 tablet (100 mg total) by mouth 2 (two) times daily. Frann Rider, NP Taking Active   traMADol (ULTRAM) 50 MG tablet [id number] Yes TAKE 1-2 TABLETS (50-100 MG TOTAL) BY MOUTH 3 (THREE) TIMES DAILY AS NEEDED FOR SEVERE PAIN. MAY CAUSE DROWSINESS. 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 08/09/2020   No-show for appointment 08/04/2020   Occult blood in stools 06/15/2020   Mesenteric lymphadenopathy 06/14/2020   Splenomegaly- mild  06/14/2020   Screening for blood or protein in urine 06/14/2020   Elevated blood pressure reading 06/14/2020   History of cannabis abuse 06/14/2020   Recurrent major depressive disorder, in partial remission ([place])  06/09/2020   Anxiety 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 06/09/2020   Alcohol use disorder, moderate, dependence ([place]) 06/09/2020   Spinal stenosis of cervical region 06/03/2020   Frequent falls 05/23/2020   Foraminal stenosis of cervical region 05/09/2020   Diabetic polyneuropathy    ADHD (attention deficit hyperactivity disorder)    Generalized anxiety disorder    Major depressive disorder    Cervical spondylosis 09/22/2019   Low back pain 07/28/2019   Essential hypertension, benign 76/16/0737   Alcoholic cirrhosis of liver 06/02/2018   Type 2 diabetes mellitus with hyperglycemia 04/25/2018   Alcohol use disorder, mild, abuse 04/25/2018    Conditions to be addressed/monitored per PCP order:  Anxiety and Depression  Care Plan : General Social Work (Adult)  Updates made by [name], LCSW since 05/24/2021 12:00 AM     Problem: Anxiety Identification (Anxiety)      Long-Range Goal: Anxiety Symptoms Identified   Start Date: 05/23/2021  Priority: High  Note:   Timeframe:  Long-Range Goal Priority:  High Start Date:  05/23/21  Expected End Date:  ongoing                      Follow Up Date 06/16/21   Current barriers:   Chronic Mental Health needs related to anxiety, grief and depression Mental Health Concerns  and Social Isolation Needs Support, Education, and Care Coordination in order to meet unmet mental health needs. Clinical Goal(s): demonstrate a reduction in symptoms related to :Anxiety , Depression, Grief, connect with provider for ongoing mental health treatment.  , and increase coping skills, healthy habits, self-management skills, and stress reduction     Clinical Interventions:  Assessed patient's previous and current treatment, coping skills, support system and barriers to care  Depression screen reviewed  PHQ2/ PHQ9 completed Provided brief CBT  Reviewed mental health medications with patient and discussed compliance:  Quality of  sleep assessed & Sleep Hygiene techniques promoted  Suicidal Ideation/Homicidal Ideation assessed: [place]  ; Review various resources, discussed options and provided patient information about  Options for mental health treatment based on need and insurance Inter-disciplinary care team collaboration (see longitudinal plan of care) Patient Goals/Self-Care Activities: Over the next 120 days Call your insurance provider for more information about your Enhanced Benefits       Follow up:  Patient agrees to Care Plan and Follow-up.  Plan: The Managed Medicaid care management team will reach out to the patient again over the next 30 days.  Date of next scheduled Social Work care management/care coordination outreach:  06/16/21  Eula Fried, BSW, MSW, LCSW Managed Medicaid LCSW [email or web address] Phone: [phone]

## 2021-05-26 ENCOUNTER — Other Ambulatory Visit: Payer: Self-pay

## 2021-06-06 ENCOUNTER — Other Ambulatory Visit: Payer: Self-pay
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
## 2021-06-12 ENCOUNTER — Other Ambulatory Visit (HOSPITAL_COMMUNITY): Payer: Self-pay

## 2021-06-15 ENCOUNTER — Other Ambulatory Visit: Payer: Self-pay

## 2021-06-15 ENCOUNTER — Inpatient Hospital Stay: Payer: Medicaid Other | Attending: Oncology

## 2021-06-15 ENCOUNTER — Ambulatory Visit: Payer: Self-pay

## 2021-06-15 DIAGNOSIS — E119 Type 2 diabetes mellitus without complications: Secondary | ICD-10-CM | POA: Insufficient documentation

## 2021-06-15 DIAGNOSIS — M549 Dorsalgia, unspecified: Secondary | ICD-10-CM | POA: Diagnosis not present

## 2021-06-15 DIAGNOSIS — G8929 Other chronic pain: Secondary | ICD-10-CM | POA: Diagnosis not present

## 2021-06-15 DIAGNOSIS — M542 Cervicalgia: Secondary | ICD-10-CM | POA: Diagnosis not present

## 2021-06-15 DIAGNOSIS — Z794 Long term (current) use of insulin: Secondary | ICD-10-CM | POA: Diagnosis not present

## 2021-06-15 DIAGNOSIS — C9210 Chronic myeloid leukemia, BCR/ABL-positive, not having achieved remission: Secondary | ICD-10-CM | POA: Diagnosis not present

## 2021-06-15 DIAGNOSIS — Z87891 Personal history of nicotine dependence: Secondary | ICD-10-CM | POA: Diagnosis not present

## 2021-06-15 DIAGNOSIS — Z79899 Other long term (current) drug therapy: Secondary | ICD-10-CM | POA: Insufficient documentation

## 2021-06-15 DIAGNOSIS — D649 Anemia, unspecified: Secondary | ICD-10-CM | POA: Diagnosis not present

## 2021-06-15 DIAGNOSIS — R161 Splenomegaly, not elsewhere classified: Secondary | ICD-10-CM | POA: Insufficient documentation

## 2021-06-15 LAB — CBC WITH DIFFERENTIAL/PLATELET
Abs Immature Granulocytes: 0.01 10*3/uL (ref 0.00–0.07)
Basophils Absolute: 0 10*3/uL (ref 0.0–0.1)
Basophils Relative: 1 %
Eosinophils Absolute: 0.2 10*3/uL (ref 0.0–0.5)
Eosinophils Relative: 3 %
HCT: 36.2 % — ABNORMAL LOW (ref 39.0–52.0)
Hemoglobin: 11.7 g/dL — ABNORMAL LOW (ref 13.0–17.0)
Immature Granulocytes: 0 %
Lymphocytes Relative: 21 %
Lymphs Abs: 1 10*3/uL (ref 0.7–4.0)
MCH: 27.7 pg (ref 26.0–34.0)
MCHC: 32.3 g/dL (ref 30.0–36.0)
MCV: 85.6 fL (ref 80.0–100.0)
Monocytes Absolute: 0.5 10*3/uL (ref 0.1–1.0)
Monocytes Relative: 10 %
Neutro Abs: 2.9 10*3/uL (ref 1.7–7.7)
Neutrophils Relative %: 65 %
Platelets: 258 10*3/uL (ref 150–400)
RBC: 4.23 MIL/uL (ref 4.22–5.81)
RDW: 16.3 % — ABNORMAL HIGH (ref 11.5–15.5)
WBC: 4.5 10*3/uL (ref 4.0–10.5)
nRBC: 0 % (ref 0.0–0.2)

## 2021-06-15 LAB — COMPREHENSIVE METABOLIC PANEL
ALT: 33 U/L (ref 0–44)
AST: 60 U/L — ABNORMAL HIGH (ref 15–41)
Albumin: 3.6 g/dL (ref 3.5–5.0)
Alkaline Phosphatase: 76 U/L (ref 38–126)
Anion gap: 7 (ref 5–15)
BUN: 20 mg/dL (ref 6–20)
CO2: 25 mmol/L (ref 22–32)
Calcium: 8.7 mg/dL — ABNORMAL LOW (ref 8.9–10.3)
Chloride: 105 mmol/L (ref 98–111)
Creatinine, Ser: 0.94 mg/dL (ref 0.61–1.24)
GFR, Estimated: >60 mL/min (ref >60)
Glucose, Bld: 163 mg/dL — ABNORMAL HIGH (ref 70–99)
Potassium: 4.2 mmol/L (ref 3.5–5.1)
Sodium: 137 mmol/L (ref 135–145)
Total Bilirubin: 0.8 mg/dL (ref 0.3–1.2)
Total Protein: 6.1 g/dL — ABNORMAL LOW (ref 6.5–8.1)

## 2021-06-15 LAB — MAGNESIUM: Magnesium: 1.9 mg/dL (ref 1.7–2.4)

## 2021-06-15 LAB — PHOSPHORUS: Phosphorus: 3 mg/dL (ref 2.5–4.6)

## 2021-06-15 NOTE — Patient Instructions (Signed)
Visit Information  [name]  - as a part of your Medicaid benefit, you are eligible for care management and care coordination services at no cost or copay. I was unable to reach you by phone today but would be happy to help you with your health related needs. Please feel free to call me @ [phone]   A member of the Managed Medicaid care management team will reach out to you again over the next 30days.   [name], BSW, [place] Managed Medicaid Team  [phone]

## 2021-06-15 NOTE — Patient Outreach (Signed)
Care Coordination  06/15/2021  Gerald Olson [birth year] [id number]   Medicaid Managed Care   Unsuccessful Outreach Note  06/15/2021 Name: Gerald Olson MRN: [id number] DOB: 1966-05-28  Referred by: Margo Common, PA-C (Inactive) Reason for referral : High Risk Managed Medicaid (MM Social work Unsuccessful Telephone outreach)   An unsuccessful telephone outreach was attempted today. The patient was referred to the case management team for assistance with care management and care coordination.   Follow Up Plan: The care management team will reach out to the patient again over the next 30 days.   [name], BSW, [place] Managed Medicaid Team  [phone]

## 2021-06-16 ENCOUNTER — Other Ambulatory Visit (HOSPITAL_COMMUNITY): Payer: Self-pay

## 2021-06-16 ENCOUNTER — Other Ambulatory Visit: Payer: Self-pay | Admitting: Family Medicine

## 2021-06-16 ENCOUNTER — Ambulatory Visit: Payer: Self-pay

## 2021-06-16 ENCOUNTER — Other Ambulatory Visit: Payer: Self-pay | Admitting: Licensed Clinical Social Worker

## 2021-06-16 DIAGNOSIS — F0634 Mood disorder due to known physiological condition with mixed features: Secondary | ICD-10-CM

## 2021-06-16 DIAGNOSIS — I10 Essential (primary) hypertension: Secondary | ICD-10-CM

## 2021-06-16 DIAGNOSIS — F3341 Major depressive disorder, recurrent, in partial remission: Secondary | ICD-10-CM

## 2021-06-16 DIAGNOSIS — Z981 Arthrodesis status: Secondary | ICD-10-CM

## 2021-06-16 DIAGNOSIS — F064 Anxiety disorder due to known physiological condition: Secondary | ICD-10-CM

## 2021-06-16 MED ORDER — TRAMADOL HCL 50 MG PO TABS
ORAL_TABLET | ORAL | 0 refills | Status: DC
  Filled 2021-06-16: qty 30, 5d supply, fill #0

## 2021-06-16 NOTE — Patient Outreach (Signed)
Medicaid Managed Care Social Work Note  06/16/2021 Name:  Gerald Olson MRN:  [id number] DOB:  09-21-1965  Gerald Olson is an 55 y.o. year old male who is a primary patient of Chrismon, Vickki Muff, PA-C (Inactive).  The Medicaid Managed Care Coordination team was consulted for assistance with:  [place] and Resources  Mr. Drewes was given information about Medicaid Managed Care Coordination team services today. Gerald Olson Patient agreed to services and verbal consent obtained.  Engaged with patient  for by telephone forfollow up visit in response to referral for case management and/or care coordination services.   Assessments/Interventions:  Review of past medical history, allergies, medications, health status, including review of consultants reports, laboratory and other test data, was performed as part of comprehensive evaluation and provision of chronic care management services.  SDOH: (Social Determinant of Health) assessments and interventions performed: SDOH Interventions    Flowsheet Row Most Recent Value  SDOH Interventions   Stress Interventions Provide Counseling, Offered [name]       Advanced Directives Status:  See Care Plan for related entries.  Care Plan                 No Known Allergies  Medications Reviewed Today     Reviewed by [name], RN (Case Manager) on 06/06/21 at 1541  Med List Status: <None>   Medication Order Taking? Sig Documenting Provider Last Dose Status Informant  Accu-Chek FastClix Lancets MISC [id number] No USE AS DIRECTED 2 TIMES DAILY Flinchum, Kelby Aline, FNP Taking Active Self  acidophilus (RISAQUAD) CAPS capsule [id number] No Take 1 capsule by mouth daily. [provider] Taking Active Self  Alpha-Lipoic Acid 600 MG CAPS [id number] No Take 1,200 mg by mouth in the morning. [provider] Taking Active            Med Note [facility], MAUREEN A   Tue May 09, 2021  4:00 PM)  Patient taking 2400 mg daily  Blood Glucose Monitoring Suppl (ACCU-CHEK GUIDE ME) w/Device KIT [id number] No 1 kit by Does not apply route 3 (three) times daily. Flinchum, Kelby Aline, FNP Taking Active Self  calcium citrate (CALCITRATE - DOSED IN MG ELEMENTAL CALCIUM) 950 (200 Ca) MG tablet [id number] No Take 630 mg of elemental calcium by mouth daily. [provider] Taking Active Self  Cholecalciferol (VITAMIN D) 125 MCG (5000 UT) CAPS [id number] No Take 5,000 Units by mouth daily. [provider] Taking Active   Patient not taking:  Discontinued 08/09/20 1309 (Patient Preference)   glucose blood test strip [id number] No USE AS DIRECTED 2 TIMES DAILY  Patient taking differently: USE AS DIRECTED 2 TIMES DAILY   Flinchum, Kelby Aline, FNP Taking Active   imatinib (GLEEVEC) 400 MG tablet [id number] No Take 1 tablet (400 mg total) by mouth daily. [name], RPH-CPP Taking Active Self  insulin glargine (LANTUS SOLOSTAR) 100 UNIT/ML Solostar Pen [id number] No Inject 14 Units into the skin every morning. [name], MD Taking Active   Insulin Pen Needle 32G X 4 MM MISC [id number] No USE TO INJECT VICTOZA ONCE DAILY. [name], MD Taking Active Self  lisinopril (ZESTRIL) 20 MG tablet [id number] No TAKE 1 TABLET (20 MG TOTAL) BY MOUTH DAILY. Chrismon, Driscilla Grammes Taking Active   magnesium 30 MG tablet [id number] No Take 500 mg by mouth daily. [provider] Taking Active Self  methocarbamol (ROBAXIN) 500 MG tablet [id number] No Take 1 tablet (500 mg total) by  mouth every 6 (six) hours as needed for muscle spasms. Kristeen Miss, MD Taking Active   Misc Natural Products [facility] CHINESE PO) [id number] No Take 4,000 mg by mouth daily. [provider] Taking Active Self  Multiple Vitamins-Minerals (MULTIVITAMIN WITH MINERALS) tablet [id number] No Take 1 tablet by mouth daily.  [provider] Taking Active Self  OVER THE COUNTER MEDICATION [id number] No Take  2,000 mg by mouth daily. Black Seed Oil [provider] Taking Active Self  OVER THE COUNTER MEDICATION [id number] No Take 1 capsule by mouth daily. Testosterone Support [provider] Taking Active Self  OVER THE COUNTER MEDICATION [id number] No Take 1,500 mg by mouth daily. Acetyl L-carnitine [provider] Taking Active Self  OVER THE COUNTER MEDICATION [id number] No Take 2,000 mg by mouth daily. Marshmallow supplement [provider] Taking Active Self  topiramate (TOPAMAX) 100 MG tablet [id number] No Take 1 tablet (100 mg total) by mouth 2 (two) times daily. Frann Rider, NP Taking Active   traMADol (ULTRAM) 50 MG tablet [id number] No TAKE 1-2 TABLETS (50-100 MG TOTAL) BY MOUTH 3 (THREE) TIMES DAILY AS NEEDED FOR SEVERE PAIN. MAY CAUSE DROWSINESS. 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 08/09/2020   No-show for appointment 08/04/2020   Occult blood in stools 06/15/2020   Mesenteric lymphadenopathy 06/14/2020   Splenomegaly- mild  06/14/2020   Screening for blood or protein in urine 06/14/2020   Elevated blood pressure reading 06/14/2020   History of cannabis abuse 06/14/2020   Recurrent major depressive disorder, in partial remission ([place]) 06/09/2020   Anxiety 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 06/09/2020   Alcohol use disorder, moderate, dependence ([place]) 06/09/2020   Spinal  stenosis of cervical region 06/03/2020   Frequent falls 05/23/2020   Foraminal stenosis of cervical region 05/09/2020   Diabetic polyneuropathy    ADHD (attention deficit hyperactivity disorder)    Generalized anxiety disorder    Major depressive disorder    Cervical spondylosis 09/22/2019   Low back pain 07/28/2019   Essential hypertension, benign 83/66/2947   Alcoholic cirrhosis of liver 06/02/2018   Type 2 diabetes mellitus with hyperglycemia 04/25/2018   Alcohol use disorder, mild, abuse 04/25/2018    Conditions to be addressed/monitored per PCP order:  Anxiety and Depression  Care Plan : General Social Work (Adult)  Updates made by [name], LCSW since 06/16/2021 12:00 AM     Problem: Anxiety Identification (Anxiety)      Long-Range Goal: Anxiety Symptoms Identified   Start Date: 05/23/2021  Priority: High  Note:   Timeframe:  Long-Range Goal Priority:  High Start Date:  05/23/21                            Expected End Date:  ongoing  Follow Up Date 07/03/21  Current barriers:   Chronic Mental Health needs related to anxiety, grief and depression Mental Health Concerns  and Social Isolation Needs Support, Education, and Care Coordination in order to meet unmet mental health needs. Clinical Goal(s): demonstrate a reduction in symptoms related to :Anxiety , Depression, Grief, connect with provider for ongoing mental health treatment.  , and increase coping skills, healthy habits, self-management skills, and stress reduction     Clinical Interventions:  Assessed patient's previous and current treatment, coping skills, support system and barriers to care  Depression screen reviewed  PHQ2/ PHQ9 completed Provided brief CBT  Reviewed mental health medications with patient and discussed compliance:  Quality of sleep assessed & Sleep Hygiene techniques promoted  Suicidal Ideation/Homicidal Ideation assessed: [place]   ; Review various resources, discussed options and provided patient information about  Options for mental health treatment based on need and insurance Inter-disciplinary care team collaboration (see longitudinal plan of care) Patient prefers CBT therapy and ONLY counseling instead of medication management. [facility] LCSW has been to [facility] and [facility] and states that he had a bill from them when he thought Medicaid should have covered his services. [facility] LCSW made referral [facility] and he has agreed to contact them to make a self referral as well.  Patient Goals/Self-Care Activities: Over the next 120 days Call your insurance provider for more information about your Enhanced Benefits        Follow up:  Patient agrees to Care Plan and Follow-up.  Plan: The Managed Medicaid care management team will reach out to the patient again over the next 30 days.  Date of next scheduled Social Work care management/care coordination outreach:  07/03/21  Eula Fried, BSW, MSW, LCSW Managed Medicaid LCSW [email or web address] Phone: [phone]

## 2021-06-16 NOTE — Telephone Encounter (Signed)
Apt for 06/20/2021 at 1pm.

## 2021-06-16 NOTE — Patient Instructions (Signed)
Visit Information  Gerald Olson was given information about Medicaid Managed Care team care coordination services as a part of their Healthy [facility] Medicaid benefit. Gerald Olson verbally consented to engagement with the [facility] Managed Care team.   If you are experiencing a medical emergency, please call 911 or report to your local emergency department or urgent care.   If you have a non-emergency medical problem during routine business hours, please contact your provider's office and ask to speak with a nurse.   For questions related to your Healthy [facility] health plan, please call: [phone] or visit the homepage here: [email or web address]  If you would like to schedule transportation through your [facility] plan, please call the following number at least 2 days in advance of your appointment: [phone]  Call the [place] at [phone], at any time, 24 hours a day, 7 days a week. If you are in danger or need immediate medical attention call 911.  If you would like help to quit smoking, call 1-800-QUIT-NOW 5754910527) OR Espaol: 1-855-Djelo-Ya (3-734-287-6811) o para ms informacin haga clic aqu or Text READY to 200-400 to register via text  Gerald Olson - following are the goals we discussed in your visit today:   Goals Addressed             This Visit's Progress    Track and Manage My Symptoms-Depression       Timeframe:  Long-Range Goal Priority:  High Start Date:  05/23/21                            Expected End Date:  ongoing                      Follow Up Date 07/03/21   - avoid negative self-talk - develop a personal safety plan - develop a plan to deal with triggers like holidays, anniversaries - exercise at least 2 to 3 times per week - have a plan for how to handle bad days - journal feelings and what helps to feel better or worse - spend time or talk with others at least 2 to 3 times  per week - spend time or talk with others every day - watch for early signs of feeling worse - write in journal every day    Why is this important?   Keeping track of your progress will help your treatment team find the right mix of medicine and therapy for you.  Write in your journal every day.  Day-to-day changes in depression symptoms are normal. It may be more helpful to check your progress at the end of each week instead of every day.     Notes:        Eula Fried, BSW, MSW, [employer] Managed Medicaid LCSW [email or web address] Phone: [phone]

## 2021-06-16 NOTE — Progress Notes (Signed)
[place]  [phone]) [phone] Fax:(336) [phone]  ID: Gerald Olson OB: [birth year]  MR#: [id number]  HQI#:[id number]  Patient Care Team: Margo Common, PA-C (Inactive) as PCP - General (Family Medicine) [name], RN as Case Manager Ethelda Chick as Social Worker Alyce Pagan as [place] [employer] (Licensed Clinical Social Worker) Hughes Better, [place] (Pharmacist)   CHIEF COMPLAINT: CML.  INTERVAL HISTORY: Patient returns to clinic today for repeat laboratory work and further evaluation.  He continues to tolerate Gleevec well without significant side effects.  He continues to have chronic back and neck pain.  He has no neurologic complaints. He denies any recent fevers or illnesses. He has a good appetite and denies weight loss. He has no chest pain, shortness of breath, cough, or hemoptysis. He denies any nausea, vomiting, constipation, or diarrhea. He has no melena or hematochezia. He has no urinary complaints.  Patient offers no further specific complaints today.  REVIEW OF SYSTEMS:   Review of Systems  Constitutional: Negative.  Negative for fever, malaise/fatigue and weight loss.  Respiratory: Negative.  Negative for cough, hemoptysis and shortness of breath.   Cardiovascular: Negative.  Negative for chest pain and leg swelling.  Gastrointestinal: Negative.  Negative for abdominal pain and blood in stool.  Genitourinary: Negative.  Negative for dysuria, flank pain and hematuria.  Musculoskeletal:  Positive for back pain and neck pain.  Skin: Negative.  Negative for rash.  Neurological: Negative.  Negative for dizziness, focal weakness, weakness and headaches.  Psychiatric/Behavioral: Negative.  The patient is not nervous/anxious.    As per HPI. Otherwise, a complete review of systems is negative.  PAST MEDICAL HISTORY: Past Medical History:  Diagnosis Date   ADHD (attention deficit  hyperactivity disorder)    Diagnosed during teenage years   Alcohol use disorder    On average 14-21 drinks per week   Alcoholic cirrhosis of liver    Per patient - condition is not related to moderate alcohol use but rather prolonged NSAID use and recent abuse.   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[place]   Major depressive disorder    Other spondylosis with radiculopathy, cervical region    Retinopathy    Right eye; per patient report   Type 2 diabetes mellitus with hyperglycemia     PAST SURGICAL HISTORY: Past Surgical History:  Procedure Laterality Date   CHOLECYSTECTOMY N/A 06/02/2018   Procedure: LAPAROSCOPIC CHOLECYSTECTOMY;  Surgeon: Vickie Epley, MD;  Location: [facility] ORS;  Service: General;  Laterality: N/A;   KNEE ARTHROSCOPY     30 Years ago   lumbar five-sacral one posterior lumbar interbody fusion  01/24/2021    FAMILY HISTORY: Family History  Problem Relation Age of Onset   Aneurysm Father    Drug abuse Sister    Multiple sclerosis Sister    Suicidality Maternal Aunt    Emphysema Maternal Grandfather    Diabetes Neg Hx     ADVANCED DIRECTIVES (Y/N):  N  HEALTH MAINTENANCE: Social History   Tobacco  Use   Smoking status: Former    Types: Cigarettes    Quit date: 06/25/2003    Years since quitting: 18.0   Smokeless tobacco: Never   Tobacco comments:    Currently vapes  Vaping Use   Vaping Use: Every day  Substance Use Topics   Alcohol use: Yes    Alcohol/week: 14.0 - 21.0 standard drinks    Types: 14 - 21 Cans of beer per week    Comment: 2-3 beers per night on average; sometimes  up to 6   Drug use: Yes    Types: Marijuana    Comment: 1/week to help sleep; edibles, does not smoke     Colonoscopy:  PAP:  Bone density:  Lipid panel:  No Known Allergies   Current Outpatient Medications  Medication Sig Dispense Refill   imatinib (GLEEVEC) 400 MG tablet Take 1 tablet (400 mg total) by mouth daily. 30 tablet 5   insulin glargine (LANTUS SOLOSTAR) 100 UNIT/ML Solostar Pen Inject 10 Units into the skin every morning. 15 mL 3   Insulin Pen Needle 32G X 4 MM MISC USE TO INJECT VICTOZA ONCE DAILY. 100 each 11   traMADol (ULTRAM) 50 MG tablet TAKE 1 - 2 TABLETS BY MOUTH 3 TIMES DAILY AS NEEDED FOR SEVERE PAIN. MAY CAUSE DROWSINESS. 60 tablet 0   No current facility-administered medications for this visit.    OBJECTIVE: Vitals:   06/20/21 1440  BP: 135/87  Pulse: (!) 102  Resp: 18  Temp: (!) 97.2 F (36.2 C)  SpO2: 99%     Body mass index is 29.29 kg/m.    ECOG FS:0 - Asymptomatic  General: Well-developed, well-nourished, no acute distress. Eyes: Pink conjunctiva, anicteric sclera. HEENT: Normocephalic, moist mucous membranes. Lungs: No audible wheezing or coughing. Heart: Regular rate and rhythm. Abdomen: Soft, nontender, no obvious distention. Musculoskeletal: No edema, cyanosis, or clubbing. Neuro: Alert, answering all questions appropriately. Cranial nerves grossly intact. Skin: No rashes or petechiae noted. Psych: Normal affect.  LAB RESULTS:  Lab Results  Component Value Date   NA 137 06/15/2021   K 4.2 06/15/2021   CL 105 06/15/2021   CO2 25 06/15/2021   GLUCOSE 163 (H) 06/15/2021   BUN 20 06/15/2021   CREATININE 0.94 06/15/2021   CALCIUM 8.7 (L) 06/15/2021   PROT 6.1 (L) 06/15/2021   ALBUMIN 3.6 06/15/2021   AST 60 (H) 06/15/2021   ALT 33 06/15/2021   ALKPHOS 76 06/15/2021   BILITOT 0.8 06/15/2021   GFRNONAA >60 06/15/2021   GFRAA 90 09/14/2020    Lab Results  Component Value Date   WBC 4.5 06/15/2021   NEUTROABS 2.9  06/15/2021   HGB 11.7 (L) 06/15/2021   HCT 36.2 (L) 06/15/2021   MCV 85.6 06/15/2021   PLT 258 06/15/2021     STUDIES: No results found.   ASSESSMENT: CML  PLAN:    1.  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  Repeat CT scan results from July 18, 2020 reviewed independently with only mild abdominal lymphadenopathy and mild splenomegaly that are chronic and unchanged.  Bone marrow biopsy was negative for disease.  Patient was initiated on 400 mg Gleevec in approximately November 2021 and has been in complete molecular remission since January 2022.  Continue Gleevec as prescribed.  Return to clinic in 3 months for laboratory work and evaluation by clinical pharmacy.  Patient will return to clinic in 6  months for repeat laboratory work and further evaluation. 2.  Anemia: Mild.  Patient's most recent hemoglobin is 11.7. 3. Splenomegaly/mesenteric lymphadenopathy: Chronic and unchanged.  Unclear if this is related to CML.  Monitor.   4.  Back/neck pain: Chronic and unchanged.  Follow-up and treatment per orthopedic physician.   I spent a total of 30 minutes reviewing chart data, face-to-face evaluation with the patient, counseling and coordination of care as detailed above.   Patient expressed understanding and was in agreement with this plan. He also understands that He can call clinic at any time with any questions, concerns, or complaints.    Lloyd Huger, MD   06/20/2021 6:38 PM

## 2021-06-16 NOTE — Telephone Encounter (Signed)
Pt. Reports he has a history of back "issues." Had back surgery in June. Having increasing back pain x 8 weeks, getting worse. Taking Naproxen and aspirin. Requesting an appointment and refill on Tramadol.Per [employer]. Will send triage for review and an appointment. Please advise.      Reason for Disposition  [1] MODERATE back pain (e.g., interferes with normal activities) AND [2] present > 3 days  Answer Assessment - Initial Assessment Questions 1. ONSET: "When did the pain begin?"      8 weeks ago 2. LOCATION: "Where does it hurt?" (upper, mid or lower back)     Low back 3. SEVERITY: "How bad is the pain?"  (e.g., Scale 1-10; mild, moderate, or severe)   - MILD (1-3): doesn't interfere with normal activities    - MODERATE (4-7): interferes with normal activities or awakens from sleep    - SEVERE (8-10): excruciating pain, unable to do any normal activities      Now- 9 4. PATTERN: "Is the pain constant?" (e.g., yes, no; constant, intermittent)      Constant 5. RADIATION: "Does the pain shoot into your legs or elsewhere?"     No 6. CAUSE:  "What do you think is causing the back pain?"      Arthritis 7. BACK OVERUSE:  "Any recent lifting of heavy objects, strenuous work or exercise?"     No 8. MEDICATIONS: "What have you taken so far for the pain?" (e.g., nothing, acetaminophen, NSAIDS)     Naproxen 9. NEUROLOGIC SYMPTOMS: "Do you have any weakness, numbness, or problems with bowel/bladder control?"     No 10. OTHER SYMPTOMS: "Do you have any other symptoms?" (e.g., fever, abdominal pain, burning with urination, blood in urine)       No 11. PREGNANCY: "Is there any chance you are pregnant?" (e.g., yes, no; LMP)       N/a  Protocols used: Back Pain-A-AH

## 2021-06-17 ENCOUNTER — Other Ambulatory Visit (HOSPITAL_COMMUNITY): Payer: Self-pay

## 2021-06-17 MED ORDER — LISINOPRIL 20 MG PO TABS
20.0000 mg | ORAL_TABLET | Freq: Every day | ORAL | 0 refills | Status: DC
  Filled 2021-06-17: qty 90, 90d supply, fill #0

## 2021-06-17 NOTE — Telephone Encounter (Signed)
Requested Prescriptions  Pending Prescriptions Disp Refills  . lisinopril (ZESTRIL) 20 MG tablet 90 tablet 0    Sig: TAKE 1 TABLET (20 MG TOTAL) BY MOUTH DAILY.     There is no refill protocol information for this order

## 2021-06-19 ENCOUNTER — Ambulatory Visit (INDEPENDENT_AMBULATORY_CARE_PROVIDER_SITE_OTHER): Payer: Medicaid Other | Admitting: Endocrinology

## 2021-06-19 ENCOUNTER — Other Ambulatory Visit: Payer: Self-pay

## 2021-06-19 VITALS — BP 120/76 | HR 82 | Ht 74.0 in | Wt 228.0 lb

## 2021-06-19 DIAGNOSIS — E1165 Type 2 diabetes mellitus with hyperglycemia: Secondary | ICD-10-CM | POA: Diagnosis not present

## 2021-06-19 LAB — POCT GLYCOSYLATED HEMOGLOBIN (HGB A1C): Hemoglobin A1C: 5.9 % — AB (ref 4.0–5.6)

## 2021-06-19 MED ORDER — LANTUS SOLOSTAR 100 UNIT/ML ~~LOC~~ SOPN
10.0000 [IU] | PEN_INJECTOR | SUBCUTANEOUS | 3 refills | Status: DC
  Filled 2021-06-19: qty 9, 90d supply, fill #0
  Filled 2021-08-02: qty 9, 90d supply, fill #1

## 2021-06-19 NOTE — Progress Notes (Signed)
Subjective:    Patient ID: Gerald Olson, male    DOB: Jan 13, 1966, 55 y.o.   MRN: [id number]  HPI Pt returns for f/u of diabetes mellitus:  DM type: Insulin-requiring type 2 Dx'ed: 2006 (he lost 70 lbs, so normoglycemia returned until 2019).   Complications: PN, PAD, and DR Therapy: insulin since 2020. DKA: never Severe hypoglycemia: never Pancreatitis: once (2021), on Victoza.   Pancreatic imaging: normal on 2021 CT SDOH: medicaid pays for lantus.  Other: He declines multiple daily injections; he declined to continue [place];  He stopped metformin, due to lightheadedness.   Interval history: He brings his meter with his cbg's which I have reviewed today.  cbg's vary from 87-172.  There is no trend throughout the day.   Past Medical History:  Diagnosis Date   ADHD (attention deficit hyperactivity disorder)    Diagnosed during teenage years   Alcohol use disorder    On average 14-21 drinks per week   Alcoholic cirrhosis of liver    Per patient - condition is not related to moderate alcohol use but rather prolonged NSAID use and recent abuse.   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[place]   Major depressive disorder    Other spondylosis with radiculopathy, cervical region    Retinopathy    Right eye; per patient report   Type 2 diabetes mellitus with hyperglycemia     Past Surgical History:  Procedure Laterality Date   CHOLECYSTECTOMY N/A 06/02/2018   Procedure: LAPAROSCOPIC CHOLECYSTECTOMY;  Surgeon: Vickie Epley, MD;  Location: [facility] ORS;  Service: General;  Laterality: N/A;   KNEE ARTHROSCOPY     30 Years  ago   lumbar five-sacral one posterior lumbar interbody fusion  01/24/2021    Social History   Socioeconomic History   Marital status: Single    Spouse name: Not on file   Number of children: Not on file   Years of education: 18   Highest education level: Master's degree (e.g., MA, MS, MEng, MEd, MSW, MBA)  Occupational History   Not on file  Tobacco Use   Smoking status: Former    Types: Cigarettes    Quit date: 06/25/2003    Years since quitting: 18.0   Smokeless tobacco: Never   Tobacco comments:    Currently vapes  Vaping Use   Vaping Use: Every day  Substance and Sexual Activity   Alcohol use: Yes    Alcohol/week: 14.0 - 21.0 standard drinks    Types: 14 - 21 Cans of beer per week    Comment: 2-3 beers per night on average; sometimes up to 6   Drug use: Yes    Types: Marijuana    Comment: 1/week to help sleep; edibles, does not smoke   Sexual activity: Yes  Other Topics Concern   Not on file  Social History Narrative   Not on file   Social Determinants of Health   Financial Resource Strain: High Risk   Difficulty of Paying Living Expenses: Hard  Food Insecurity: No Food Insecurity   Worried  About Running Out of Food in the Last Year: Never true   Ran Out of Food in the Last Year: Never true  Transportation Needs: Unmet Transportation Needs   Lack of Transportation (Medical): Yes   Lack of Transportation (Non-Medical): Yes  Physical Activity: Insufficiently Active   Days of Exercise per Week: 3 days   Minutes of Exercise per Session: 10 min  Stress: Stress Concern Present   Feeling of Stress : Rather much  Social Connections: Socially Isolated   Frequency of Communication with Friends and Family: More than three times a week   Frequency of Social Gatherings with Friends and Family: More than three times a week   Attends Religious Services: Never   Marine scientist or Organizations: No   Attends Archivist Meetings: Never   Marital Status:  Never married  Human resources officer Violence: Not on file    Current Outpatient Medications on File Prior to Visit  Medication Sig Dispense Refill   imatinib (GLEEVEC) 400 MG tablet Take 1 tablet (400 mg total) by mouth daily. 30 tablet 5   Insulin Pen Needle 32G X 4 MM MISC USE TO INJECT VICTOZA ONCE DAILY. 100 each 11   traMADol (ULTRAM) 50 MG tablet TAKE 1 - 2 TABLETS BY MOUTH 3 TIMES DAILY AS NEEDED FOR SEVERE PAIN. MAY CAUSE DROWSINESS. 60 tablet 0   [DISCONTINUED] DULoxetine (CYMBALTA) 60 MG capsule Take 1 capsule (60 mg total) by mouth 2 (two) times daily. (Patient not taking: No sig reported) 60 capsule 1   No current facility-administered medications on file prior to visit.    No Known Allergies  Family History  Problem Relation Age of Onset   Aneurysm Father    Drug abuse Sister    Multiple sclerosis Sister    Suicidality Maternal Aunt    Emphysema Maternal Grandfather    Diabetes Neg Hx     BP 120/76 (BP Location: Right Arm, Patient Position: Sitting, Cuff Size: Large)   Pulse 82   Ht 6\' 2"  (1.88 m)   Wt 228 lb (103.4 kg)   SpO2 97%   BMI 29.27 kg/m    Review of Systems     Objective:   Physical Exam    Lab Results  Component Value Date   CREATININE 0.94 06/15/2021   BUN 20 06/15/2021   NA 137 06/15/2021   K 4.2 06/15/2021   CL 105 06/15/2021   CO2 25 06/15/2021   Lab Results  Component Value Date   HGBA1C 5.9 (A) 06/19/2021       Assessment & Plan:  Insulin-requiring type 2 DM: overcontrolled.   Patient Instructions  check your blood sugar twice a day.  vary the time of day when you check, between before the 3 meals, and at bedtime.  also check if you have symptoms of your blood sugar being too high or too low.  please keep a record of the readings and bring it to your next appointment here (or you can bring the meter itself).  You can write it on any piece of paper.  please call us sooner if your blood sugar goes below 70, or if you have a lot of  readings over 200.   I have sent a prescription to your pharmacy, to reduce the Lantus to 10 units each morning.   Please come back for a follow-up appointment in 3 months.

## 2021-06-19 NOTE — Patient Instructions (Addendum)
check your blood sugar twice a day.  vary the time of day when you check, between before the 3 meals, and at bedtime.  also check if you have symptoms of your blood sugar being too high or too low.  please keep a record of the readings and bring it to your next appointment here (or you can bring the meter itself).  You can write it on any piece of paper.  please call us sooner if your blood sugar goes below 70, or if you have a lot of readings over 200.   I have sent a prescription to your pharmacy, to reduce the Lantus to 10 units each morning.   Please come back for a follow-up appointment in 3 months.

## 2021-06-20 ENCOUNTER — Ambulatory Visit: Payer: Medicaid Other | Admitting: Physician Assistant

## 2021-06-20 ENCOUNTER — Encounter: Payer: Self-pay | Admitting: Physician Assistant

## 2021-06-20 ENCOUNTER — Inpatient Hospital Stay (HOSPITAL_BASED_OUTPATIENT_CLINIC_OR_DEPARTMENT_OTHER): Payer: Medicaid Other | Admitting: Oncology

## 2021-06-20 VITALS — BP 135/87 | HR 102 | Temp 97.2°F | Resp 18 | Wt 228.1 lb

## 2021-06-20 VITALS — BP 122/80 | HR 101 | Temp 99.1°F | Ht 74.0 in | Wt 229.0 lb

## 2021-06-20 DIAGNOSIS — R161 Splenomegaly, not elsewhere classified: Secondary | ICD-10-CM | POA: Diagnosis not present

## 2021-06-20 DIAGNOSIS — Z87891 Personal history of nicotine dependence: Secondary | ICD-10-CM | POA: Diagnosis not present

## 2021-06-20 DIAGNOSIS — Z79899 Other long term (current) drug therapy: Secondary | ICD-10-CM | POA: Diagnosis not present

## 2021-06-20 DIAGNOSIS — C9210 Chronic myeloid leukemia, BCR/ABL-positive, not having achieved remission: Secondary | ICD-10-CM

## 2021-06-20 DIAGNOSIS — D649 Anemia, unspecified: Secondary | ICD-10-CM | POA: Diagnosis not present

## 2021-06-20 DIAGNOSIS — Z794 Long term (current) use of insulin: Secondary | ICD-10-CM | POA: Diagnosis not present

## 2021-06-20 DIAGNOSIS — M549 Dorsalgia, unspecified: Secondary | ICD-10-CM | POA: Diagnosis not present

## 2021-06-20 DIAGNOSIS — E119 Type 2 diabetes mellitus without complications: Secondary | ICD-10-CM | POA: Diagnosis not present

## 2021-06-20 DIAGNOSIS — M545 Low back pain, unspecified: Secondary | ICD-10-CM

## 2021-06-20 DIAGNOSIS — G8929 Other chronic pain: Secondary | ICD-10-CM | POA: Diagnosis not present

## 2021-06-20 DIAGNOSIS — Z981 Arthrodesis status: Secondary | ICD-10-CM

## 2021-06-20 DIAGNOSIS — M542 Cervicalgia: Secondary | ICD-10-CM | POA: Diagnosis not present

## 2021-06-20 LAB — POCT URINALYSIS DIPSTICK
Appearance: NORMAL
Bilirubin, UA: NEGATIVE
Blood, UA: NEGATIVE
Glucose, UA: NEGATIVE
Ketones, UA: NEGATIVE
Leukocytes, UA: NEGATIVE
Nitrite, UA: NEGATIVE
Protein, UA: POSITIVE — AB
Spec Grav, UA: 1.015
Urobilinogen, UA: NEGATIVE U/dL — AB
pH, UA: 6.5

## 2021-06-20 NOTE — Progress Notes (Signed)
Established patient visit   Patient: Gerald Olson   DOB: 1965/11/28   55 y.o. Male  MRN: [id number] Visit Date: 06/20/2021  Today's healthcare provider: Alfredia Ferguson, PA-C   Cc. Low back pain  Subjective    HPI  [name] is a 55 year old male who is status post lumbar fusion 5/22 and has a history of CML and alcoholic cirrhosis who presents today with acute right sided mid to lower back pain.  He describes his current pain is different from his chronic low back pain.  It starts at the end of his ribs on the right side and can radiate down to his pelvis.  This pain primarily occurs when he goes from sitting to standing or bending over.  He routinely takes naproxen and tramadol for pain relief, reports alternating these medications.  He also often takes up to 5 "high-dose" aspirin for headache relief.  He occasionally will take 10 mg + of his mother's Flexeril to help sleep, but this does not help with current low back pain. He uses heat and a TENS unit as well.   His last follow-up with the spine surgeon was several months ago. Reports chronic urinary issues, reports chronic odor to urine and light-colored urine.  Recently reports may be some increased frequency of urination and decreased volume.  Follows with the urologist.  Denies any gross hematuria dysuria or inability to urinate.  Denies nausea/vomiting.  Reports drinking 2 quarts of water daily.  He also reports the only medications he is taking are the naproxen, tramadol, insulin, gleevec.  He has not taken his lisinopril in several weeks.  Reports blood pressure at home is 120s over 80s.  Reports drinking 2 monster energy drinks before the visit today.    Medications: Outpatient Medications Prior to Visit  Medication Sig   imatinib (GLEEVEC) 400 MG tablet Take 1 tablet (400 mg total) by mouth daily.   insulin glargine (LANTUS SOLOSTAR) 100 UNIT/ML Solostar Pen Inject 10 Units into the skin every morning.   Insulin Pen Needle  32G X 4 MM MISC USE TO INJECT VICTOZA ONCE DAILY.   traMADol (ULTRAM) 50 MG tablet TAKE 1 - 2 TABLETS BY MOUTH 3 TIMES DAILY AS NEEDED FOR SEVERE PAIN. MAY CAUSE DROWSINESS.   [DISCONTINUED] Accu-Chek FastClix Lancets MISC USE AS DIRECTED 2 TIMES DAILY (Patient not taking: Reported on 06/20/2021)   [DISCONTINUED] acidophilus (RISAQUAD) CAPS capsule Take 1 capsule by mouth daily. (Patient not taking: Reported on 06/20/2021)   [DISCONTINUED] Alpha-Lipoic Acid 600 MG CAPS Take 1,200 mg by mouth in the morning. (Patient not taking: Reported on 06/20/2021)   [DISCONTINUED] Blood Glucose Monitoring Suppl (ACCU-CHEK GUIDE ME) w/Device KIT 1 kit by Does not apply route 3 (three) times daily. (Patient not taking: Reported on 06/20/2021)   [DISCONTINUED] calcium citrate (CALCITRATE - DOSED IN MG ELEMENTAL CALCIUM) 950 (200 Ca) MG tablet Take 630 mg of elemental calcium by mouth daily. (Patient not taking: Reported on 06/20/2021)   [DISCONTINUED] Cholecalciferol (VITAMIN D) 125 MCG (5000 UT) CAPS Take 5,000 Units by mouth daily. (Patient not taking: Reported on 06/20/2021)   [DISCONTINUED] glucose blood test strip USE AS DIRECTED 2 TIMES DAILY (Patient not taking: Reported on 06/20/2021)   [DISCONTINUED] lisinopril (ZESTRIL) 20 MG tablet Take 1 tablet (20 mg total) by mouth daily. (Patient not taking: Reported on 06/20/2021)   [DISCONTINUED] magnesium 30 MG tablet Take 500 mg by mouth daily. (Patient not taking: Reported on 06/20/2021)   [DISCONTINUED] methocarbamol (ROBAXIN)  500 MG tablet Take 1 tablet (500 mg total) by mouth every 6 (six) hours as needed for muscle spasms. (Patient not taking: Reported on 06/20/2021)   [DISCONTINUED] Misc Natural Products (GINSENG-RED CHINESE PO) Take 4,000 mg by mouth daily. (Patient not taking: Reported on 06/20/2021)   [DISCONTINUED] Multiple Vitamins-Minerals (MULTIVITAMIN WITH MINERALS) tablet Take 1 tablet by mouth daily.  (Patient not taking: Reported on 06/20/2021)    [DISCONTINUED] OVER THE COUNTER MEDICATION Take 2,000 mg by mouth daily. Black Seed Oil (Patient not taking: Reported on 06/20/2021)   [DISCONTINUED] OVER THE COUNTER MEDICATION Take 1 capsule by mouth daily. Testosterone Support (Patient not taking: Reported on 06/20/2021)   [DISCONTINUED] OVER THE COUNTER MEDICATION Take 1,500 mg by mouth daily. Acetyl L-carnitine (Patient not taking: Reported on 06/20/2021)   [DISCONTINUED] OVER THE COUNTER MEDICATION Take 2,000 mg by mouth daily. Marshmallow supplement (Patient not taking: Reported on 06/20/2021)   [DISCONTINUED] topiramate (TOPAMAX) 100 MG tablet Take 1 tablet (100 mg total) by mouth 2 (two) times daily. (Patient not taking: Reported on 06/20/2021)   No facility-administered medications prior to visit.    Review of Systems  Constitutional:  Positive for activity change.  Genitourinary:  Positive for decreased urine volume and frequency. Negative for difficulty urinating, hematuria and urgency.  Musculoskeletal:  Positive for arthralgias and back pain.  Neurological:  Negative for weakness and light-headedness.   Last CBC Lab Results  Component Value Date   WBC 4.5 06/15/2021   HGB 11.7 (L) 06/15/2021   HCT 36.2 (L) 06/15/2021   MCV 85.6 06/15/2021   MCH 27.7 06/15/2021   RDW 16.3 (H) 06/15/2021   PLT 258 02/58/5277   Last metabolic panel Lab Results  Component Value Date   GLUCOSE 163 (H) 06/15/2021   NA 137 06/15/2021   K 4.2 06/15/2021   CL 105 06/15/2021   CO2 25 06/15/2021   BUN 20 06/15/2021   CREATININE 0.94 06/15/2021   GFRNONAA >60 06/15/2021   CALCIUM 8.7 (L) 06/15/2021   PHOS 3.0 06/15/2021   PROT 6.1 (L) 06/15/2021   ALBUMIN 3.6 06/15/2021   LABGLOB 2.6 09/14/2020   AGRATIO 1.6 09/14/2020   BILITOT 0.8 06/15/2021   ALKPHOS 76 06/15/2021   AST 60 (H) 06/15/2021   ALT 33 06/15/2021   ANIONGAP 7 06/15/2021   Last lipids Lab Results  Component Value Date   CHOL 122 06/14/2020   HDL 34 (L) 06/14/2020    LDLCALC 72 06/14/2020   TRIG 77 06/14/2020   CHOLHDL 3.6 06/14/2020   Last hemoglobin A1c Lab Results  Component Value Date   HGBA1C 5.9 (A) 06/19/2021   Last thyroid functions Lab Results  Component Value Date   TSH 3.970 06/27/2020   T4TOTAL 5.2 06/27/2020   Last vitamin D Lab Results  Component Value Date   VD25OH 114.0 (H) 10/08/2019   Last vitamin B12 and Folate Lab Results  Component Value Date   VITAMINB12 540 06/27/2020       Objective    BP 122/80 Comment: at home per pt  Pulse (!) 101   Temp 99.1 F (37.3 C) (Oral)   Ht $R'6\' 2"'gn$  (1.88 m)   Wt 229 lb (103.9 kg)   SpO2 99%   BMI 29.40 kg/m  BP Readings from Last 3 Encounters:  06/20/21 135/87  06/20/21 122/80  06/19/21 120/76   Wt Readings from Last 3 Encounters:  06/20/21 228 lb 1.6 oz (103.5 kg)  06/20/21 229 lb (103.9 kg)  06/19/21 228 lb (103.4 kg)  Physical Exam Constitutional:      Appearance: Normal appearance.  Cardiovascular:     Rate and Rhythm: Normal rate and regular rhythm.     Heart sounds: Normal heart sounds.  Pulmonary:     Effort: Pulmonary effort is normal.     Breath sounds: Normal breath sounds.  Musculoskeletal:     Comments: Pain with flexion and extension of the lumbar spine. Focal tenderness or bony tenderness on exam. No visible ecchymosis, edema, erythema.  Neurological:     Mental Status: He is alert.  Psychiatric:        Mood and Affect: Mood is elated.        Speech: Speech is rapid and pressured.        Behavior: Behavior is hyperactive.     Results for orders placed or performed in visit on 06/20/21  POCT Urinalysis Dipstick  Result Value Ref Range   Color, UA yellow    Clarity, UA clear    Glucose, UA Negative Negative   Bilirubin, UA negative    Ketones, UA negative    Spec Grav, UA 1.015 1.010 - 1.025   Blood, UA negative    pH, UA 6.5 5.0 - 8.0   Protein, UA Positive (A) Negative   Urobilinogen, UA negative (A) 0.2 or 1.0 E.U./dL   Nitrite,  UA negative    Leukocytes, UA Negative Negative   Appearance normal     Assessment & Plan    Acute right sided back pain  Strongly advised against taking "5 high dose aspirin" for pain relief.  Discouraged pt from taking multiple 10 mg Flexeril pills, especially if they arent prescribed to him. Fine to alternate with the naproxen and tramadol for pain relief. Urine dip - for leuk, hematuria; + protein --chronic and followed by urology and oncology has f/u scheduled Will check micro , if pain increases or pt develops N/V can proceed with CT scan to evaluate for nephrolithiasis, but unlikely.  Encouraged to increase fluid intake, increase electrolytes with Gatorade. Strongly advised to f/u with ortho surgeon as pain appears largely musculoskeletal, but may be secondary to CML or Gleevec.  Discussed potential lumbar films-- pt states he had them recently with ortho surgeon, last I see are from 5/22.   2. Hypertension Okay to d/c lisinopril for now, but monitor BP at home and f/u in 1 mo specifically for BP.   Strongly encouraged follow up with specialists-oncologist, endocrinology, and urology.   Return in about 1 month (around 07/21/2021) for hypertension.      I, [name], PA-C have reviewed all documentation for this visit. The documentation on  06/20/2021 for the exam, diagnosis, procedures, and orders are all accurate and complete.    [name], PA-C  [facility] [phone] (phone) [phone] (fax)  [place]

## 2021-06-20 NOTE — Progress Notes (Signed)
Pt has questions about lab levels and is wondering if Gleevec medication effects kidneys?

## 2021-06-21 ENCOUNTER — Other Ambulatory Visit: Payer: Self-pay

## 2021-06-21 ENCOUNTER — Other Ambulatory Visit: Payer: Self-pay | Admitting: Physician Assistant

## 2021-06-21 DIAGNOSIS — N069 Isolated proteinuria with unspecified morphologic lesion: Secondary | ICD-10-CM

## 2021-06-21 DIAGNOSIS — I10 Essential (primary) hypertension: Secondary | ICD-10-CM

## 2021-06-21 MED ORDER — LISINOPRIL 10 MG PO TABS
10.0000 mg | ORAL_TABLET | Freq: Every day | ORAL | 3 refills | Status: DC
  Filled 2021-06-21 – 2022-03-07 (×3): qty 90, 90d supply, fill #0
  Filled 2022-06-18: qty 90, 90d supply, fill #1

## 2021-06-22 ENCOUNTER — Ambulatory Visit: Payer: Medicaid Other | Admitting: Oncology

## 2021-06-22 DIAGNOSIS — Z794 Long term (current) use of insulin: Secondary | ICD-10-CM | POA: Diagnosis not present

## 2021-06-22 DIAGNOSIS — H2513 Age-related nuclear cataract, bilateral: Secondary | ICD-10-CM | POA: Diagnosis not present

## 2021-06-22 DIAGNOSIS — H35713 Central serous chorioretinopathy, bilateral: Secondary | ICD-10-CM | POA: Diagnosis not present

## 2021-06-22 DIAGNOSIS — E113293 Type 2 diabetes mellitus with mild nonproliferative diabetic retinopathy without macular edema, bilateral: Secondary | ICD-10-CM | POA: Diagnosis not present

## 2021-06-22 LAB — URINALYSIS, MICROSCOPIC ONLY
Bacteria, UA: NONE SEEN
Casts: NONE SEEN /lpf
Epithelial Cells (non renal): NONE SEEN /hpf (ref 0–10)
WBC, UA: NONE SEEN /hpf (ref 0–5)

## 2021-06-24 LAB — BCR-ABL1, CML/ALL, PCR, QUANT: Interpretation (BCRAL):: NEGATIVE

## 2021-06-26 ENCOUNTER — Other Ambulatory Visit (HOSPITAL_COMMUNITY): Payer: Self-pay

## 2021-06-26 ENCOUNTER — Other Ambulatory Visit: Payer: Self-pay

## 2021-06-27 ENCOUNTER — Other Ambulatory Visit (HOSPITAL_COMMUNITY): Payer: Self-pay

## 2021-06-27 DIAGNOSIS — M545 Low back pain, unspecified: Secondary | ICD-10-CM | POA: Diagnosis not present

## 2021-06-27 DIAGNOSIS — Z981 Arthrodesis status: Secondary | ICD-10-CM | POA: Diagnosis not present

## 2021-06-27 MED ORDER — CYCLOBENZAPRINE HCL 5 MG PO TABS
ORAL_TABLET | ORAL | 0 refills | Status: DC
  Filled 2021-06-27: qty 15, 15d supply, fill #0

## 2021-06-28 ENCOUNTER — Other Ambulatory Visit (HOSPITAL_COMMUNITY): Payer: Self-pay

## 2021-06-28 MED ORDER — ACETAZOLAMIDE ER 500 MG PO CP12
ORAL_CAPSULE | ORAL | 2 refills | Status: DC
  Filled 2021-06-28: qty 60, 30d supply, fill #0
  Filled 2021-08-01: qty 60, 30d supply, fill #1
  Filled 2021-09-12 (×2): qty 60, 30d supply, fill #2

## 2021-07-03 ENCOUNTER — Other Ambulatory Visit: Payer: Self-pay

## 2021-07-03 NOTE — Patient Instructions (Signed)
Visit Information  Mr. Moffa was given information about Medicaid Managed Care team care coordination services as a part of their Healthy [facility] Medicaid benefit. [name] verbally consented to engagement with the [facility] Managed Care team.   If you are experiencing a medical emergency, please call 911 or report to your local emergency department or urgent care.   If you have a non-emergency medical problem during routine business hours, please contact your provider's office and ask to speak with a nurse.   For questions related to your Healthy [facility] health plan, please call: [phone] or visit the homepage here: [email or web address]  If you would like to schedule transportation through your Healthy [facility] plan, please call the following number at least 2 days in advance of your appointment: [phone]  Call the [place] at [phone], at any time, 24 hours a day, 7 days a week. If you are in danger or need immediate medical attention call 911.  If you would like help to quit smoking, call 1-800-QUIT-NOW (401)488-7585) OR Espaol: 1-855-Djelo-Ya (9-458-592-9244) o para ms informacin haga clic aqu or Text READY to 200-400 to register via text  Mr. Fiveash - following are the goals we discussed in your visit today:   Goals Addressed             This Visit's Progress    COMPLETED: Track and Manage My Symptoms-Depression       Timeframe:  Long-Range Goal Priority:  High Start Date:  05/23/21                            Expected End Date:  07/03/21                   - avoid negative self-talk - develop a personal safety plan - develop a plan to deal with triggers like holidays, anniversaries - exercise at least 2 to 3 times per week - have a plan for how to handle bad days - journal feelings and what helps to feel better or worse - spend time or talk with others at least 2 to 3 times per week - spend  time or talk with others every day - watch for early signs of feeling worse - write in journal every day    Why is this important?   Keeping track of your progress will help your treatment team find the right mix of medicine and therapy for you.  Write in your journal every day.  Day-to-day changes in depression symptoms are normal. It may be more helpful to check your progress at the end of each week instead of every day.     Notes:        Eula Fried, BSW, MSW, [employer] Managed Medicaid [email or web address] Phone: [phone]

## 2021-07-03 NOTE — Patient Outreach (Signed)
Medicaid Managed Care Social Work Note  07/03/2021 Name:  Gerald Olson MRN:  [id number] DOB:  Oct 20, 1965  Gerald Olson is an 55 y.o. year old male who is a primary patient of Chrismon, Vickki Muff, PA-C (Inactive).  The Medicaid Managed Care Coordination team was consulted for assistance with:  [place] and Resources  [name] was given information about Medicaid Managed Care Coordination team services today. Gerald Olson Patient agreed to services and verbal consent obtained.  Engaged with patient  for by telephone for discharge visit  in response to referral for case management and/or care coordination services.   Assessments/Interventions:  Review of past medical history, allergies, medications, health status, including review of consultants reports, laboratory and other test data, was performed as part of comprehensive evaluation and provision of chronic care management services.  SDOH: (Social Determinant of Health) assessments and interventions performed: SDOH Interventions    Flowsheet Row Most Recent Value  SDOH Interventions   Stress Interventions Provide Counseling, Offered [name]       Advanced Directives Status:  See Care Plan for related entries.  Care Plan                 No Known Allergies  Medications Reviewed Today     Reviewed by Emelia Loron (Physician Assistant Certified) on 99/37/16 at 1457  Med List Status: <None>   Medication Order Taking? Sig Documenting Provider Last Dose Status Informant   Patient not taking:   Discontinued 08/09/20 1309 (Patient Preference)   imatinib (GLEEVEC) 400 MG tablet [id number] Yes Take 1 tablet (400 mg total) by mouth daily. [name], RPH-CPP  Active Self  insulin glargine (LANTUS SOLOSTAR) 100 UNIT/ML Solostar Pen [id number] Yes Inject 10 Units into the skin every morning. [name], MD  Active   Insulin Pen Needle 32G X 4 MM MISC [id number] Yes USE TO INJECT VICTOZA ONCE  DAILY. [name], MD  Active Self  traMADol Veatrice Bourbon) 50 MG tablet [id number] Yes TAKE 1 - 2 TABLETS BY MOUTH 3 TIMES DAILY AS NEEDED FOR SEVERE PAIN. MAY CAUSE DROWSINESS. 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 08/09/2020   Mesenteric lymphadenopathy 06/14/2020   Splenomegaly- mild  06/14/2020   History of cannabis abuse 06/14/2020   Recurrent major depressive disorder, in partial remission ([place]) 06/09/2020   Anxiety 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 06/09/2020   Alcohol use disorder, moderate, dependence ([place]) 06/09/2020   Frequent falls 05/23/2020   Foraminal stenosis of cervical region 05/09/2020   Diabetic polyneuropathy    ADHD (attention deficit hyperactivity disorder)    Generalized anxiety disorder    Major depressive disorder    Cervical spondylosis 09/22/2019   Essential hypertension, benign 76/19/5093   Alcoholic cirrhosis of liver 06/02/2018   Type 2 diabetes mellitus with hyperglycemia 04/25/2018    Conditions to be addressed/monitored per PCP order:  Anxiety and Depression  Care Plan : General Social Work (Adult)  Updates made by [name], LCSW since 07/03/2021 12:00 AM  Problem: Anxiety Identification (Anxiety)      Long-Range Goal: Anxiety Symptoms Identified   Start Date: 05/23/2021  Priority: High  Note:   Timeframe:  Long-Range Goal Priority:  High Start Date:  05/23/21                            Expected End Date: 07/03/21  Current barriers:   Chronic Mental Health needs related to anxiety, grief and  depression Mental Health Concerns  and Social Isolation Needs Support, Education, and Care Coordination in order to meet unmet mental health needs. Clinical Goal(s): demonstrate a reduction in symptoms related to :Anxiety , Depression, Grief, connect with provider for ongoing mental health treatment.  , and increase coping skills, healthy habits, self-management skills, and stress reduction     Clinical Interventions:  Assessed patient's previous and current treatment, coping skills, support system and barriers to care  Depression screen reviewed  PHQ2/ PHQ9 completed Provided brief CBT  Reviewed mental health medications with patient and discussed compliance:  Quality of sleep assessed & Sleep Hygiene techniques promoted  Suicidal Ideation/Homicidal Ideation assessed: [place]  ; Review various resources, discussed options and provided patient information about  Options for mental health treatment based on need and insurance Inter-disciplinary care team collaboration (see longitudinal plan of care) Patient prefers CBT therapy and ONLY counseling instead of medication management. [facility] LCSW has been to [facility] and [facility] and states that he had a bill from them when he thought Medicaid should have covered his services. [facility] LCSW made referral [facility] and he has agreed to contact them to make a self referral as well. UPDATE- Patient has not contacted [facility] yet and prefers to not pursue mental health treatment at this time as he wishes to focus on physical health and only wants to see a psychologist at this time which is not covered by his Medicaid plan. [facility] LCSW has provided list of mental health resources in case he changes his mind. [facility] LCSW will close case at this time.  Patient Goals/Self-Care Activities: Over the next 120 days Call your insurance provider for more information about your Enhanced Benefits        Follow  up:  Patient agrees to Care Plan and Follow-up.  Plan: The Managed Medicaid care management team is available to follow up with the patient after provider conversation with the patient regarding recommendation for care management engagement and subsequent re-referral to the care management team.   Eula Fried, BSW, MSW, LCSW Managed Medicaid LCSW [email or web address] Phone: [phone]

## 2021-07-04 ENCOUNTER — Other Ambulatory Visit: Payer: Self-pay

## 2021-07-04 NOTE — Patient Outreach (Signed)
Medicaid Managed Care   Nurse Care Manager Note  07/04/2021 Name:  Gerald Olson MRN:  [id number] DOB:  October 08, 1965  Gerald Olson is an 55 y.o. year old male who is a primary patient of Chrismon, Vickki Muff, PA-C (Inactive).  The [facility] Managed Care Coordination team was consulted for assistance with:    HTN DMII CML  Chronic Back Pain  Mr. Marcella was given information about Medicaid Managed Care Coordination team services today. Gerald Olson Patient agreed to services and verbal consent obtained.  Engaged with patient by telephone for follow up visit in response to provider referral for case management and/or care coordination services.   Assessments/Interventions:  Review of past medical history, allergies, medications, health status, including review of consultants reports, laboratory and other test data, was performed as part of comprehensive evaluation and provision of chronic care management services.  SDOH (Social Determinants of Health) assessments and interventions performed:   Care Plan  No Known Allergies  Medications Reviewed Today     Reviewed by [name], RN (Case Manager) on 07/04/21 at [place] List Status: <None>   Medication Order Taking? Sig Documenting Provider Last Dose Status Informant  acetaZOLAMIDE ER (DIAMOX) 500 MG capsule [id number]  Take 1 capsule (500 mg total) by mouth 2 (two) times daily   Active   cyclobenzaprine (FLEXERIL) 5 MG tablet [id number]  Take 1 tablet by mouth daily at bed time as needed for back pain   Active   Patient not taking:  Discontinued 08/09/20 1309 (Patient Preference)   imatinib (GLEEVEC) 400 MG tablet [id number] No Take 1 tablet (400 mg total) by mouth daily. [name], RPH-CPP Taking Active Self  insulin glargine (LANTUS SOLOSTAR) 100 UNIT/ML Solostar Pen [id number] No Inject 10 Units into the skin every morning. [name], MD Taking Active   Insulin Pen Needle 32G X 4 MM MISC [id number] No USE TO INJECT  VICTOZA ONCE DAILY. [name], MD Taking Active Self  lisinopril (ZESTRIL) 10 MG tablet [id number]  Take 1 tablet (10 mg total) by mouth daily.   Active   traMADol (ULTRAM) 50 MG tablet [id number] No TAKE 1 - 2 TABLETS BY MOUTH 3 TIMES DAILY AS NEEDED FOR SEVERE PAIN. MAY CAUSE DROWSINESS. 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 08/09/2020   Mesenteric lymphadenopathy 06/14/2020   Splenomegaly- mild  06/14/2020   History of cannabis abuse 06/14/2020   Recurrent major depressive disorder, in partial remission ([place]) 06/09/2020   Anxiety 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 06/09/2020   Alcohol use disorder, moderate, dependence ([place]) 06/09/2020   Frequent falls 05/23/2020   Foraminal stenosis of cervical region 05/09/2020   Diabetic polyneuropathy    ADHD (attention deficit hyperactivity disorder)    Generalized anxiety disorder    Major depressive disorder    Cervical spondylosis 09/22/2019   Essential hypertension, benign 87/86/7672   Alcoholic cirrhosis of liver 06/02/2018   Type 2 diabetes mellitus with hyperglycemia 04/25/2018    Conditions to be addressed/monitored per PCP order:  HTN, DMII,  and CML, Chronic Back Pain  Care Plan : RNCM Plan of Care  Updates made by [name], RN since 07/04/2021 12:00 AM     Problem: Chronic Disease Management and Care Coordination Needs for DM, HTN, CML and Back Pain      Long-Range Goal: Plan of Care for Chronic Disease management and Care Coordination Needs   Start Date: 05/09/2021   Expected End Date: 10/07/2021  Priority: High  Note:   Current Barriers:  Knowledge Deficits related to plan of care for management of HTN, DMII, and CML and Chronic Back Pain  Care Coordination needs related to Financial constraints related to paying back taxes on house, Limited social support, and Transportation  Chronic Disease Management support and education needs related to HTN, DMII, and CML and Chronic Back Pain Financial Constraints.  Transportation barriers Non-adherence to prescribed medication regimen No Advanced Directives in place  RNCM Clinical Goal(s):  Patient will verbalize understanding of plan for management of HTN, DMII, and CML and Chronic Back Pain verbalize basic understanding of HTN, DMII, and CML and Chronic Back Pain disease process and self health management plan  take all medications exactly as prescribed and will call provider for medication related questions attend all scheduled medical appointments: [facility] Pharmacist on 07/07/2021 demonstrate ongoing adherence to prescribed treatment plan for HTN, DMII, and CML and Chronic Back Pain as evidenced by daily monitoring and recording of CBG, adherence to ADA/ carb modified diet, adherence to prescribed medication regimen, contacting provider for new or worsened symptoms or questions  demonstrate ongoing health management independence  continue to work with [place] to address care management and care coordination needs related to HTN, DMII, and CML and Chronic Back Pain  work with pharmacist to address complex medication regimen  related to HTN, DMII, and CML and Chronic Back Pain work with Education officer, museum to address financial constraints related to overdue payment of back taxes and potential risk of losing home, Limited social support, transportation, and counseling for anxiety/stress related to the management of HTN, DMII, and CML and Chronic Back Pain work with community resource care guide to address needs  related to financial constraints related to overdue taxes and limited social support. collaborate with the care management team towards completion of advanced directives  through collaboration with RN Care manager, provider, and care team.   Interventions: Inter-disciplinary care team collaboration (see longitudinal plan of care) Evaluation of current treatment plan related to  self management and patient's adherence to plan as established by provider   Diabetes:  (Status: Goal on Track (progressing): YES.)  Lab Results  Component Value Date   HGBA1C 5.9 (A) 06/19/2021    Assessed patient's understanding of A1c goal: <7% Provided education to patient about basic DM disease process; Reviewed medications with patient and discussed importance of medication adherence.   Counseled on importance of regular laboratory monitoring as prescribed;        Discussed plans with patient for ongoing care management follow up and provided patient with direct contact information for care management team;      Reviewed scheduled/upcoming provider appointments including: [facility] Pharmacist on 07/07/2021        Advised patient, providing education and rationale, to check cbg 2 times/day and record.  Patient reports blood sugar range mainly between 120 - 130 with lowest reading 82 and highest reading 157.       call provider for findings outside established parameters;       Referral made to pharmacy  team for assistance with complex medication regimen;       Referral made to social work team for assistance with counseling with LCSW.  LCSW has provided counseling within the past month. LCSW closing her case due to patient not wanting to pursue mental health treatment and wants to focus on physical health at this time.  Review of patient status, including review of consultants reports, relevant laboratory and other test results, and medications completed.         Assessed social determinant of health barriers;          Chronic Myeloid Leukemia (CML)  (Status: Goal on Track (progressing): YES.) Evaluation of current treatment plan related to  CML and patient's adherence to plan as established by provider. Patient continues to work on plan for removal of suspected black mold in his bedroom due to wet humid conditions.  Discussed plans with patient for ongoing care management follow up and provided patient with direct contact information for care management team Reviewed medications with patient and discussed the importance of taking all medications as prescribed; Social Work referral for [facility] counseling completed. Pharmacy referral for complex medication regimen.  Scheduled appointment on 07/07/2021 Assessed social determinant of health barriers;   Hypertension: (Status: Goal on Track (progressing): YES.) Last practice recorded BP readings:    BP Readings from Last 3 Encounters:  06/20/21 135/87  06/20/21 122/80  06/19/21 120/76    Most recent eGFR/CrCl: No results found for: EGFR  No components found for: CRCL  Evaluation of current treatment plan related to hypertension self management and patient's adherence to plan as established by provider;   Reviewed medications with patient and discussed importance of compliance;  Counseled on the importance of exercise goals with target of 150 minutes per week but understand how patient's increased fatigue impacts the amount of exercise he can do. Discussed plans with patient for ongoing care management follow up and provided patient with direct contact information for care management team; Advised patient, providing education and rationale, to monitor blood pressure 2 to 3 times/week and record, calling PCP for findings outside established parameters.  Reviewed scheduled/upcoming provider appointments including: [facility] Pharmacist on 07/07/2021  Assessed social determinant of health barriers;   Pain:  (Status: Goal on Track (progressing): YES.) Pain assessment  performed.  Patient continues to have mid to lower back pain which he rates as a "7 - 8" currently, with pain increasing to an "11" over the past weekend due to his bending when giving his dog a bath.  Pain rating is on a Pain Scale of 0 - 10. Medications reviewed Reviewed provider established plan for pain management; Discussed importance of adherence to all scheduled medical appointments;  Patient Goals/Self-Care Activities: Patient will self administer medications as prescribed as evidenced by self report/primary caregiver report  Patient will attend all scheduled provider appointments as evidenced by clinician review of documented attendance to scheduled appointments and patient/caregiver report Patient will call pharmacy for medication refills as evidenced by patient report and review of pharmacy fill history as appropriate Patient will continue to perform ADL's independently as evidenced by patient/caregiver report Patient will continue to perform IADL's independently as evidenced by patient/caregiver report Patient will call provider office for new concerns or questions as evidenced by review of documented incoming telephone call notes and patient report Patient will work with BSW to address care coordination needs and will continue to work with the clinical team to address health care and disease management related needs as evidenced by  documented adherence to scheduled care management/care coordination appointments - check blood sugar at prescribed times: twice daily - enter blood sugar readings and medication or insulin into daily log - take the blood sugar log to all doctor visits - check blood pressure weekly - write blood pressure results in a log or diary - keep a blood pressure log - take blood pressure log to all doctor appointments - call doctor for signs and symptoms of high blood pressure - keep all doctor appointments - take medications for blood pressure exactly as  prescribed       Follow Up:  Patient agrees to Care Plan and Follow-up.  Plan: The Managed Medicaid care management team will reach out to the patient again over the next 30 days.  Date/time of next scheduled RN care management/care coordination outreach:  12//6/202 at 3:00 PM  [place], [facility] Mobile: [phone]

## 2021-07-04 NOTE — Patient Instructions (Signed)
Visit Information  [name] was given information about Medicaid Managed Care team care coordination services as a part of their Healthy [facility] Medicaid benefit. Gerald Olson verbally consented to engagement with the [facility] Managed Care team.   If you are experiencing a medical emergency, please call 911 or report to your local emergency department or urgent care.   If you have a non-emergency medical problem during routine business hours, please contact your provider's office and ask to speak with a nurse.   For questions related to your Healthy [facility] health plan, please call: [phone] or visit the homepage here: [email or web address]  If you would like to schedule transportation through your Healthy [facility] plan, please call the following number at least 2 days in advance of your appointment: [phone]  Call the [place] at [phone], at any time, 24 hours a day, 7 days a week. If you are in danger or need immediate medical attention call 911.  If you would like help to quit smoking, call 1-800-QUIT-NOW (479)332-8344) OR Espaol: 1-855-Djelo-Ya (2-505-397-6734) o para ms informacin haga clic aqu or Text READY to 200-400 to register via text  Gerald Olson - following are the goals we discussed in your visit today:   Goals Addressed   None     Please see education materials related to today's visit provided by MyChart link.  The patient can review all information regarding today's visit vis My Chart link.  The Managed Medicaid care management team will reach out to the patient again over the next 30 days.   [name] RN, BSN Community Care Coordinator [place] [facility] Mobile: [phone]   Following is a copy of your plan of care:  Care Plan : [facility] Plan of Care  Updates made by [name], RN since 07/04/2021 12:00 AM     Problem: Chronic Disease Management  and Care Coordination Needs for DM, HTN, CML and Back Pain      Long-Range Goal: Plan of Care for Chronic Disease management and Care Coordination Needs   Start Date: 05/09/2021  Expected End Date: 10/07/2021  Priority: High  Note:   Current Barriers:  Knowledge Deficits related to plan of care for management of HTN, DMII, and CML and Chronic Back Pain  Care Coordination needs related to Financial constraints related to paying back taxes on house, Limited social support, and Transportation  Chronic Disease Management support and education needs related to HTN, DMII, and CML and Chronic Back Pain Financial Constraints.  Transportation barriers Non-adherence to prescribed medication regimen No Advanced Directives in place  RNCM Clinical Goal(s):  Patient will verbalize understanding of plan for management of HTN, DMII, and CML and Chronic Back Pain verbalize basic understanding of HTN, DMII, and CML and Chronic Back Pain disease process and self health management plan  take all medications exactly as prescribed and will call provider for medication related questions attend all scheduled medical appointments: [facility] Pharmacist on 07/07/2021 demonstrate ongoing adherence to prescribed treatment plan for HTN, DMII, and CML and Chronic Back Pain as evidenced by daily monitoring and recording of CBG, adherence to ADA/ carb modified diet, adherence to prescribed medication regimen, contacting provider for new or worsened symptoms or questions  demonstrate ongoing health management independence  continue to work with [place] to address care management and care coordination needs related to HTN, DMII, and CML and Chronic Back Pain  work with pharmacist to address complex medication regimen  related to  HTN, DMII, and CML and Chronic Back Pain work with Education officer, museum to address financial constraints related to overdue payment of back taxes and potential risk of losing home, Limited social support,  transportation, and counseling for anxiety/stress related to the management of HTN, DMII, and CML and Chronic Back Pain work with community resource care guide to address needs related to financial constraints related to overdue taxes and limited social support. collaborate with the care management team towards completion of advanced directives  through collaboration with RN Care manager, provider, and care team.   Interventions: Inter-disciplinary care team collaboration (see longitudinal plan of care) Evaluation of current treatment plan related to  self management and patient's adherence to plan as established by provider   Diabetes:  (Status: Goal on Track (progressing): YES.)  Lab Results  Component Value Date   HGBA1C 5.9 (A) 06/19/2021    Assessed patient's understanding of A1c goal: <7% Provided education to patient about basic DM disease process; Reviewed medications with patient and discussed importance of medication adherence.   Counseled on importance of regular laboratory monitoring as prescribed;        Discussed plans with patient for ongoing care management follow up and provided patient with direct contact information for care management team;      Reviewed scheduled/upcoming provider appointments including: [facility] Pharmacist on 07/07/2021        Advised patient, providing education and rationale, to check cbg 2 times/day and record.  Patient reports blood sugar range mainly between 120 - 130 with lowest reading 82 and highest reading 157.       call provider for findings outside established parameters;       Referral made to pharmacy team for assistance with complex medication regimen;       Referral made to social work team for assistance with counseling with LCSW.  LCSW has provided counseling within the past month. LCSW closing her case due to patient not wanting to pursue mental health treatment and wants to focus on physical health at this time.  Review of patient status,  including review of consultants reports, relevant laboratory and other test results, and medications completed.         Assessed social determinant of health barriers;         Chronic Myeloid Leukemia (CML)  (Status: Goal on Track (progressing): YES.) Evaluation of current treatment plan related to  CML and patient's adherence to plan as established by provider. Patient continues to work on plan for removal of suspected black mold in his bedroom due to wet humid conditions.  Discussed plans with patient for ongoing care management follow up and provided patient with direct contact information for care management team Reviewed medications with patient and discussed the importance of taking all medications as prescribed; Social Work referral for [facility] counseling completed. Pharmacy referral for complex medication regimen.  Scheduled appointment on 07/07/2021 Assessed social determinant of health barriers;   Hypertension: (Status: Goal on Track (progressing): YES.) Last practice recorded BP readings:    BP Readings from Last 3 Encounters:  06/20/21 135/87  06/20/21 122/80  06/19/21 120/76    Most recent eGFR/CrCl: No results found for: EGFR  No components found for: CRCL  Evaluation of current treatment plan related to hypertension self management and patient's adherence to plan as established by provider;   Reviewed medications with patient and discussed importance of compliance;  Counseled on the importance of exercise goals with target of 150 minutes per week but understand how patient's  increased fatigue impacts the amount of exercise he can do. Discussed plans with patient for ongoing care management follow up and provided patient with direct contact information for care management team; Advised patient, providing education and rationale, to monitor blood pressure 2 to 3 times/week and record, calling PCP for findings outside established parameters.  Reviewed scheduled/upcoming provider  appointments including: [facility] Pharmacist on 07/07/2021  Assessed social determinant of health barriers;   Pain:  (Status: Goal on Track (progressing): YES.) Pain assessment performed.  Patient continues to have mid to lower back pain which he rates as a "7 - 8" currently, with pain increasing to an "11" over the past weekend due to his bending when giving his dog a bath.  Pain rating is on a Pain Scale of 0 - 10. Medications reviewed Reviewed provider established plan for pain management; Discussed importance of adherence to all scheduled medical appointments;  Patient Goals/Self-Care Activities: Patient will self administer medications as prescribed as evidenced by self report/primary caregiver report  Patient will attend all scheduled provider appointments as evidenced by clinician review of documented attendance to scheduled appointments and patient/caregiver report Patient will call pharmacy for medication refills as evidenced by patient report and review of pharmacy fill history as appropriate Patient will continue to perform ADL's independently as evidenced by patient/caregiver report Patient will continue to perform IADL's independently as evidenced by patient/caregiver report Patient will call provider office for new concerns or questions as evidenced by review of documented incoming telephone call notes and patient report Patient will work with BSW to address care coordination needs and will continue to work with the clinical team to address health care and disease management related needs as evidenced by documented adherence to scheduled care management/care coordination appointments - check blood sugar at prescribed times: twice daily - enter blood sugar readings and medication or insulin into daily log - take the blood sugar log to all doctor visits - check blood pressure weekly - write blood pressure results in a log or diary - keep a blood pressure log - take blood pressure log to  all doctor appointments - call doctor for signs and symptoms of high blood pressure - keep all doctor appointments - take medications for blood pressure exactly as prescribed

## 2021-07-07 ENCOUNTER — Ambulatory Visit: Payer: Self-pay

## 2021-07-09 ENCOUNTER — Telehealth: Payer: Self-pay | Admitting: Pharmacist

## 2021-07-09 NOTE — Patient Outreach (Signed)
07/07/2021 Name: Gerald Olson MRN: [id number] DOB: Aug 15, 1966  Referred by: Margo Common, PA-C (Inactive) Reason for referral : No chief complaint on file.   An unsuccessful telephone outreach was attempted today. The patient was referred to the case management team for assistance with care management and care coordination.    Follow Up Plan: A HIPAA compliant phone message was left for the patient providing contact information and requesting a return call. and The Managed Medicaid care management team will reach out to the patient again over the next 10 days.   Hughes Better PharmD, CPP High Risk Managed Medicaid [place] [phone]

## 2021-07-10 ENCOUNTER — Telehealth: Payer: Self-pay

## 2021-07-10 NOTE — Telephone Encounter (Signed)
Copied from [place] [id number]. Topic: General - Other >> Jul 10, 2021  4:27 PM Erick Blinks wrote: Reason for CRM: wants PCP to know that his BP returned back down to normal an hour after his last appt. Had [facility] at the cancer center. Also is taking Lisinopril again  Message for PCP

## 2021-07-13 DIAGNOSIS — R801 Persistent proteinuria, unspecified: Secondary | ICD-10-CM | POA: Diagnosis not present

## 2021-07-13 DIAGNOSIS — R82998 Other abnormal findings in urine: Secondary | ICD-10-CM | POA: Diagnosis not present

## 2021-07-13 DIAGNOSIS — I10 Essential (primary) hypertension: Secondary | ICD-10-CM | POA: Diagnosis not present

## 2021-07-13 DIAGNOSIS — E1129 Type 2 diabetes mellitus with other diabetic kidney complication: Secondary | ICD-10-CM | POA: Diagnosis not present

## 2021-07-27 ENCOUNTER — Other Ambulatory Visit (HOSPITAL_COMMUNITY): Payer: Self-pay

## 2021-08-01 ENCOUNTER — Other Ambulatory Visit: Payer: Self-pay

## 2021-08-01 ENCOUNTER — Other Ambulatory Visit (HOSPITAL_COMMUNITY): Payer: Self-pay

## 2021-08-01 DIAGNOSIS — Z981 Arthrodesis status: Secondary | ICD-10-CM

## 2021-08-01 NOTE — Telephone Encounter (Signed)
Copied from [place] [id number]. Topic: Quick Communication - Rx Refill/Question >> Aug 01, 2021  4:04 PM [name] wrote: Medication: Rx #: [id number]  traMADol (ULTRAM) 50 MG tablet [[id number]]   Has the patient contacted their pharmacy? Yes.   (Agent: If no, request that the patient contact the pharmacy for the refill. If patient does not wish to contact the pharmacy document the reason why and proceed with request.) (Agent: If yes, when and what did the pharmacy advise?)  Preferred Pharmacy (with phone number or street name): [facility]  Phone:  [phone] Fax:  [phone]  Has the patient been seen for an appointment in the last year OR does the patient have an upcoming appointment? Yes.    Agent: Please be advised that RX refills may take up to 3 business days. We ask that you follow-up with your pharmacy.

## 2021-08-01 NOTE — Patient Instructions (Signed)
Visit Information  [name] was given information about Medicaid Managed Care team care coordination services as a part of their Healthy [facility] Medicaid benefit. Gerald Olson verbally consented to engagement with the [facility] Managed Care team.   If you are experiencing a medical emergency, please call 911 or report to your local emergency department or urgent care.   If you have a non-emergency medical problem during routine business hours, please contact your provider's office and ask to speak with a nurse.   For questions related to your Healthy [facility] health plan, please call: [phone] or visit the homepage here: [email or web address]  If you would like to schedule transportation through your Healthy [facility] plan, please call the following number at least 2 days in advance of your appointment: [phone]  Call the [place] at [phone], at any time, 24 hours a day, 7 days a week. If you are in danger or need immediate medical attention call 911.  If you would like help to quit smoking, call 1-800-QUIT-NOW (878)567-2085) OR Espaol: 1-855-Djelo-Ya (2-878-676-7209) o para ms informacin haga clic aqu or Text READY to 200-400 to register via text  Gerald Olson - following are the goals we discussed in your visit today:   Goals Addressed   None     Please see education materials related to today's visit provided by MyChart link.  The patient verbalized understanding of today's visit information and will review information vis My Chart link.  The Managed Medicaid care management team will reach out to the patient again over the next 30 days.   Gerald Marvel RN, BSN Community Care Coordinator [place] [facility] Mobile: [phone]   Following is a copy of your plan of care:  Care Plan : [facility] Plan of Care  Updates made by [name], RN since 08/01/2021 12:00 AM      Problem: Chronic Disease Management and Care Coordination Needs for DM, HTN, CML and Back Pain      Long-Range Goal: Plan of Care for Chronic Disease management and Care Coordination Needs   Start Date: 05/09/2021  Expected End Date: 10/07/2021  Priority: High  Note:   Current Barriers:  Knowledge Deficits related to plan of care for management of HTN, DMII, and CML and Chronic Back Pain  Care Coordination needs related to Financial constraints related to paying back taxes on house and Limited social support  Chronic Disease Management support and education needs related to HTN, DMII, and CML and Chronic Back Pain Financial Constraints.  Transportation barriers Non-adherence to prescribed medication regimen No Advanced Directives in place  RNCM Clinical Goal(s):  Patient will verbalize understanding of plan for management of HTN, DMII, and CML and Chronic Back Pain verbalize basic understanding of HTN, DMII, and CML and Chronic Back Pain disease process and self health management plan  take all medications exactly as prescribed and will call provider for medication related questions attend all scheduled medical appointments: 08/31/2021 Cysto; 09/11/2021 Dr Darden Dates Office Visit; 09/20/2021 Dr Loanne [name]  demonstrate ongoing adherence to prescribed treatment plan for HTN, DMII, and CML and Chronic Back Pain as evidenced by daily monitoring and recording of CBG, adherence to ADA/ carb modified diet, adherence to prescribed medication regimen, contacting provider for new or worsened symptoms or questions  demonstrate ongoing health management independence  continue to work with RN Care Manager to address care management and care coordination needs related to HTN, DMII, and CML and Chronic Back Pain  work with  pharmacist to address complex medication regimen  related to HTN, DMII, and CML and Chronic Back Pain work with Education officer, museum to address financial constraints related to overdue payment of back  taxes and potential risk of losing home, Limited social support and counseling for anxiety/stress related to the management of HTN, DMII, and CML and Chronic Back Pain. collaborate with the care management team towards completion of advanced directives  through collaboration with RN Care manager, provider, and care team.   Interventions: Inter-disciplinary care team collaboration (see longitudinal plan of care) Evaluation of current treatment plan related to  self management and patient's adherence to plan as established by provider   Diabetes:  (Status: Goal on Track (progressing): YES.)  Lab Results  Component Value Date   HGBA1C 5.9 (A) 06/19/2021    Assessed patient's understanding of A1c goal: <7% Continue to provided education to patient about basic DM disease process; Reviewed medications with patient and discussed importance of medication adherence.   Counseled on importance of regular laboratory monitoring as prescribed;        Discussed plans with patient for ongoing care management follow up and provided patient with direct contact information for care management team;      Reviewed scheduled/upcoming provider appointments including: see scheduled January appointments under Clinical Goals        Patient reports he has not checked his blood sugar in the last 2 weeks due to not eating his routine diet because of a decreased appetite and changes to his taste buds during a recent respiratory illness over Thanksgiving (started on 07/18/21 thru 07/23/21).  Blood sugars were in the 120 range prior to that.  Patient denies any hypoglycemic or hyperglycemic episodes over the last 2 weeks.  Advised patient, providing education and rationale, to get back on his regular schedule of checking blood sugars 2 times/day and record.       Call provider for findings outside established parameters;       Referral made to pharmacy team for assistance with complex medication regimen.  Patient reports he  missed a recent telephone visit from [facility] Pharmacist but will be available next time pharmacist attempts to make contact.       Review of patient status, including review of consultants reports, relevant laboratory and other test results, and medications completed.               Chronic Myeloid Leukemia (CML)  (Status: Goal on Track (progressing): YES.) Evaluation of current treatment plan related to  CML and patient's adherence to plan as established by provider. Patient continues to have suspected black mold in his bedroom due to warm & cool temperature conditions.  Patient suspects the recent respiratory illness was caused by possible black mold in bedroom.  Patient working on removing black mold with use of vinager. Discussed plans with patient for ongoing care management follow up and provided patient with direct contact information for care management team Reviewed medications with patient and discussed the importance of taking all medications as prescribed; Pharmacy referral for complex medication regimen.  Awaiting rescheduled appointment.  Hypertension: (Status: Goal on Track (progressing): YES.) Last practice recorded BP readings:     BP Readings from Last 3 Encounters:  06/20/21 135/87  06/20/21 122/80  06/19/21 120/76    Most recent eGFR/CrCl: No results found for: EGFR  No components found for: CRCL  Evaluation of current treatment plan related to hypertension self management and patient's adherence to plan as established by provider;   Reviewed  medications with patient and discussed importance of compliance;  Counseled on the importance of exercise goals with target of 150 minutes per week but understand how patient's increased fatigue impacts the amount of exercise he can do.  Patient reports he was able to walk a mile over the past month. Discussed plans with patient for ongoing care management follow up and provided patient with direct contact information for care management  team; Patient reports he has not been checking his blood pressure in last 2 weeks due to a respiratory illness.  Advised patient, providing education and rationale, to monitor blood pressure 2 to 3 times/week and record, calling PCP for findings outside established parameters.  Reviewed scheduled/upcoming provider appointments including:See Clinical Goeal above for future appointments  Pain:  (Status: Goal on Track (progressing): YES.) Pain assessment performed.  Patient continues to have mid to lower back pain which he rates as a "4" currently. Pain rating is on a Pain Scale of 0 - 10.  Patient reports his back pain has improved slightly since he was coughing over the last 2 weeks. Medications reviewed.  Noted that patient needs refill on Tramadol.  This RN Care Manager placed a phone call to [facility] and requested a refill of Tramadol.  Dr. Lelon Huh will be patient's PCP temporarily since patient's PCP retired. Reviewed provider established plan for pain management; Discussed importance of adherence to all scheduled medical appointments;  Patient Goals/Self-Care Activities: Patient will self administer medications as prescribed as evidenced by self report/primary caregiver report  Patient will attend all scheduled provider appointments as evidenced by clinician review of documented attendance to scheduled appointments and patient/caregiver report Patient will call pharmacy for medication refills as evidenced by patient report and review of pharmacy fill history as appropriate Patient will continue to perform ADL's independently as evidenced by patient/caregiver report Patient will continue to perform IADL's independently as evidenced by patient/caregiver report Patient will call provider office for new concerns or questions as evidenced by review of documented incoming telephone call notes and patient report Patient will work with BSW to address care coordination needs and  will continue to work with the clinical team to address health care and disease management related needs as evidenced by documented adherence to scheduled care management/care coordination appointments - check blood sugar at prescribed times: twice daily - enter blood sugar readings and medication or insulin into daily log - take the blood sugar log to all doctor visits - check blood pressure weekly - write blood pressure results in a log or diary - keep a blood pressure log - take blood pressure log to all doctor appointments - call doctor for signs and symptoms of high blood pressure - keep all doctor appointments - take medications for blood pressure exactly as prescribed

## 2021-08-01 NOTE — Telephone Encounter (Signed)
Requested medications are due for refill today.  yes  Requested medications are on the active medications list.  yes  Last refill. 06/16/2021  Future visit scheduled.   yes  Notes to clinic.  Medication not delegated.    Requested Prescriptions  Pending Prescriptions Disp Refills   traMADol (ULTRAM) 50 MG tablet 60 tablet 0    Sig: TAKE 1 - 2 TABLETS BY MOUTH 3 TIMES DAILY AS NEEDED FOR SEVERE PAIN. MAY CAUSE DROWSINESS.     Not Delegated - Analgesics:  Opioid Agonists Failed - 08/01/2021  4:09 PM      Failed - This refill cannot be delegated      Failed - Urine Drug Screen completed in last 360 days      Passed - Valid encounter within last 6 months    Recent Outpatient Visits           1 month ago S/P lumbar spinal fusion   [facility] [place], [place], PA-C   5 months ago S/P lumbar spinal fusion   [place], PA-C   9 months ago Low back pain, unspecified back pain laterality, unspecified chronicity, unspecified whether sciatica present   [facility], Kelby Aline, FNP   10 months ago Essential hypertension, benign   [facility], Kelby Aline, FNP   11 months ago Fall, initial encounter   [facility], Kelby Aline, FNP       Future Appointments             In 1 month Thedore Mins, Ria Comment, PA-C [employer], [place]   In 1 month [name], RPH-CPP [facility] at [employer]

## 2021-08-01 NOTE — Progress Notes (Addendum)
Encounter details: CCM Time Spent     None      Moderate to High Complex Decision Making     None      CCM Services: This encounter meets routine CCM services.  Prior to outreach and patient consent for Chronic Care Management, I referred this patient for services after reviewing the nominated patient list or from a personal encounter with the patient.  I have personally reviewed this encounter including the documentation in this note and have collaborated with the care management provider regarding care management and care coordination activities to include development and update of the comprehensive care plan. I am certifying that I agree with the content of this note and encounter as supervising physician.

## 2021-08-01 NOTE — Patient Outreach (Addendum)
Medicaid Managed Care   Nurse Care Manager Note  08/01/2021 Name:  Gerald Olson MRN:  [id number] DOB:  [birth year]  Gerald Olson is an 55 y.o. year old male who is a primary patient of Thedore Mins, Ria Comment, [place].  The [facility] Managed Care Coordination team was consulted for assistance with:    HTN DMII CML Chronic Back Pain  [name] was given information about Medicaid Managed Care Coordination team services today. Gerald Olson Patient agreed to services and verbal consent obtained.  Engaged with patient by telephone for follow up visit in response to provider referral for case management and/or care coordination services.   Assessments/Interventions:  Review of past medical history, allergies, medications, health status, including review of consultants reports, laboratory and other test data, was performed as part of comprehensive evaluation and provision of chronic care management services.  SDOH (Social Determinants of Health) assessments and interventions performed:   Care Plan  No Known Allergies  Medications Reviewed Today     Reviewed by [name], RN (Case Manager) on 08/01/21 at 1  Med List Status: <None>   Medication Order Taking? Sig Documenting Provider Last Dose Status Informant  acetaZOLAMIDE ER (DIAMOX) 500 MG capsule [id number]  Take 1 capsule (500 mg total) by mouth 2 (two) times daily   Active   cyclobenzaprine (FLEXERIL) 5 MG tablet [id number]  Take 1 tablet by mouth daily at bed time as needed for back pain   Active   Patient not taking:  Discontinued 08/09/20 1309 (Patient Preference)   imatinib (GLEEVEC) 400 MG tablet [id number] No Take 1 tablet (400 mg total) by mouth daily. [name], RPH-CPP Taking Active Self  insulin glargine (LANTUS SOLOSTAR) 100 UNIT/ML Solostar Pen [id number] No Inject 10 Units into the skin every morning. [name], MD Taking Active   Insulin Pen Needle 32G X 4 MM MISC [id number] No USE TO INJECT VICTOZA ONCE DAILY.  [name], MD Taking Active Self  lisinopril (ZESTRIL) 10 MG tablet [id number]  Take 1 tablet (10 mg total) by mouth daily.   Active   traMADol (ULTRAM) 50 MG tablet [id number] No TAKE 1 - 2 TABLETS BY MOUTH 3 TIMES DAILY AS NEEDED FOR SEVERE PAIN. MAY CAUSE DROWSINESS. 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 08/09/2020   Mesenteric lymphadenopathy 06/14/2020   Splenomegaly- mild  06/14/2020   History of cannabis abuse 06/14/2020   Recurrent major depressive disorder, in partial remission ([place]) 06/09/2020   Anxiety 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 06/09/2020   Alcohol use disorder, moderate, dependence ([place]) 06/09/2020   Frequent falls 05/23/2020   Foraminal stenosis of cervical region 05/09/2020   Diabetic polyneuropathy    ADHD (attention deficit hyperactivity disorder)    Generalized anxiety disorder    Major depressive disorder    Cervical spondylosis 09/22/2019   Essential hypertension, benign 90/30/0923   Alcoholic cirrhosis of liver 06/02/2018   Type 2 diabetes mellitus with hyperglycemia 04/25/2018    Conditions to be addressed/monitored per PCP order:  HTN, DMII, and CML, Chronic  Back Pain  Care Plan : RNCM Plan of Care  Updates made by [name], RN since 08/01/2021 12:00 AM     Problem: Chronic Disease Management and Care Coordination Needs for DM, HTN, CML and Back Pain      Long-Range Goal: Plan of Care for Chronic Disease management and Care Coordination Needs   Start Date: 05/09/2021  Expected End Date:  10/07/2021  Priority: High  Note:   Current Barriers:  Knowledge Deficits related to plan of care for management of HTN, DMII, and CML and Chronic Back Pain  Care Coordination needs related to Financial constraints related to paying back taxes on house and Limited social support  Chronic Disease Management support and education needs related to HTN, DMII, and CML and Chronic Back Pain Financial Constraints.  Transportation barriers Non-adherence to prescribed medication regimen No Advanced Directives in place  RNCM Clinical Goal(s):  Patient will verbalize understanding of plan for management of HTN, DMII, and CML and Chronic Back Pain verbalize basic understanding of HTN, DMII, and CML and Chronic Back Pain disease process and self health management plan  take all medications exactly as prescribed and will call provider for medication related questions attend all scheduled medical appointments: 08/31/2021 Cysto; 09/11/2021 Dr Darden Dates Office Visit; 09/20/2021 Dr Loanne [name]  demonstrate ongoing adherence to prescribed treatment plan for HTN, DMII, and CML and Chronic Back Pain as evidenced by daily monitoring and recording of CBG, adherence to ADA/ carb modified diet, adherence to prescribed medication regimen, contacting provider for new or worsened symptoms or questions  demonstrate ongoing health management independence  continue to work with RN Care Manager to address care management and care coordination needs related to HTN, DMII, and CML and Chronic Back Pain  work with pharmacist to address complex medication regimen  related to HTN, DMII, and CML and Chronic Back Pain work with Education officer, museum to address financial constraints related to overdue payment of back taxes and potential risk of losing home, Limited social support and counseling for anxiety/stress related to the management of HTN, DMII, and CML and Chronic Back Pain. collaborate with the care management team towards completion of advanced  directives  through collaboration with RN Care manager, provider, and care team.   Interventions: Inter-disciplinary care team collaboration (see longitudinal plan of care) Evaluation of current treatment plan related to  self management and patient's adherence to plan as established by provider   Diabetes:  (Status: Goal on Track (progressing): YES.)  Lab Results  Component Value Date   HGBA1C 5.9 (A) 06/19/2021    Assessed patient's understanding of A1c goal: <7% Continue to provided education to patient about basic DM disease process; Reviewed medications with patient and discussed importance of medication adherence.   Counseled on importance of regular laboratory monitoring as prescribed;        Discussed plans with patient for ongoing care management follow up and provided patient with direct contact information for care management team;      Reviewed scheduled/upcoming provider appointments including: see scheduled January appointments under Clinical Goals        Patient reports he has not checked his blood sugar in the last 2 weeks due to not eating his routine diet because of a decreased appetite and changes to his taste buds during a recent respiratory illness over Thanksgiving (started on 07/18/21 thru 07/23/21).  Blood sugars were in the 120 range prior to that.  Patient denies any hypoglycemic or hyperglycemic episodes over the last 2 weeks.  Advised patient, providing education and rationale, to get back on his regular schedule of checking blood sugars 2 times/day and record.       Call provider for findings outside established parameters;       Referral made to pharmacy team for assistance with complex medication regimen.  Patient reports he missed a recent telephone visit from [facility] Pharmacist but will be available next time pharmacist attempts to make contact.       Review of patient status, including review of consultants reports, relevant laboratory and other test results, and  medications completed.               Chronic Myeloid Leukemia (CML)  (Status: Goal on Track (progressing): YES.) Evaluation of current treatment plan related to  CML and patient's adherence to plan as established by provider. Patient continues to have suspected black mold in his bedroom due to warm & cool temperature conditions.  Patient suspects the recent respiratory illness was caused by possible black mold in bedroom.  Patient working on removing black mold with use of vinager. Discussed plans with patient for ongoing care management follow up and provided patient with direct contact information for care management team Reviewed medications with patient and discussed the importance of taking all medications as prescribed; Pharmacy referral for complex medication regimen.  Awaiting rescheduled appointment.  Hypertension: (Status: Goal on Track (progressing): YES.) Last practice recorded BP readings:     BP Readings from Last 3 Encounters:  06/20/21 135/87  06/20/21 122/80  06/19/21 120/76    Most recent eGFR/CrCl: No results found for: EGFR  No components found for: CRCL  Evaluation of current treatment plan related to hypertension self management and patient's adherence to plan as established by provider;   Reviewed medications with patient and discussed importance of compliance;  Counseled on the importance of exercise goals with target of 150 minutes per week but understand how patient's increased fatigue impacts the amount of exercise he can do.  Patient reports he was able to walk a mile over the past month. Discussed plans with patient for ongoing care management follow up and provided patient with direct contact information for care management team; Patient reports he has not been checking his blood pressure in last 2 weeks due to a respiratory illness.  Advised patient, providing education and rationale, to monitor blood pressure 2 to 3 times/week and record, calling PCP for findings  outside established parameters.  Reviewed scheduled/upcoming provider appointments including:See Clinical Goeal above for future appointments  Pain:  (Status: Goal on Track (progressing): YES.) Pain assessment performed.  Patient continues to have mid to lower back pain which he rates as a "4" currently. Pain rating is on a Pain Scale of 0 - 10.  Patient reports his back pain has improved slightly since he was coughing over the last 2 weeks. Medications reviewed.  Noted that patient needs refill on Tramadol.  This RN Care Manager placed a phone call to [facility] and requested a refill of Tramadol.  Dr. Lelon Huh will be patient's PCP temporarily since patient's PCP retired. Reviewed provider established plan for pain management; Discussed importance of adherence to all scheduled medical appointments;  Patient Goals/Self-Care Activities: Patient will self administer medications as prescribed as evidenced by self report/primary caregiver report  Patient will attend all scheduled provider appointments as evidenced by clinician review of documented attendance to scheduled appointments and patient/caregiver report Patient will call pharmacy for medication refills as evidenced by patient report and review  of pharmacy fill history as appropriate Patient will continue to perform ADL's independently as evidenced by patient/caregiver report Patient will continue to perform IADL's independently as evidenced by patient/caregiver report Patient will call provider office for new concerns or questions as evidenced by review of documented incoming telephone call notes and patient report Patient will work with BSW to address care coordination needs and will continue to work with the clinical team to address health care and disease management related needs as evidenced by documented adherence to scheduled care management/care coordination appointments - check blood sugar at prescribed times:  twice daily - enter blood sugar readings and medication or insulin into daily log - take the blood sugar log to all doctor visits - check blood pressure weekly - write blood pressure results in a log or diary - keep a blood pressure log - take blood pressure log to all doctor appointments - call doctor for signs and symptoms of high blood pressure - keep all doctor appointments - take medications for blood pressure exactly as prescribed       Follow Up:  Patient agrees to Care Plan and Follow-up.  Plan: The Managed Medicaid care management team will reach out to the patient again over the next 30 days.  Date/time of next scheduled RN care management/care coordination outreach:  August 29, 2021 at 3:00 PM  [place], [place] [facility] Mobile: [phone]

## 2021-08-02 ENCOUNTER — Other Ambulatory Visit (HOSPITAL_COMMUNITY): Payer: Self-pay

## 2021-08-02 MED ORDER — TRAMADOL HCL 50 MG PO TABS
ORAL_TABLET | ORAL | 1 refills | Status: DC
  Filled 2021-08-02: qty 60, 10d supply, fill #0
  Filled 2021-10-27: qty 18, 3d supply, fill #1

## 2021-08-29 ENCOUNTER — Other Ambulatory Visit (HOSPITAL_COMMUNITY): Payer: Self-pay

## 2021-08-29 ENCOUNTER — Other Ambulatory Visit: Payer: Self-pay

## 2021-08-29 NOTE — Patient Instructions (Signed)
Visit Information  [name] was given information about Medicaid Managed Care team care coordination services as a part of their Healthy [facility] Medicaid benefit. Gerald Olson verbally consented to engagement with the [facility] Managed Care team.   If you are experiencing a medical emergency, please call 911 or report to your local emergency department or urgent care.   If you have a non-emergency medical problem during routine business hours, please contact your provider's office and ask to speak with a nurse.   For questions related to your Healthy [facility] health plan, please call: [phone] or visit the homepage here: [email or web address]  If you would like to schedule transportation through your Healthy [facility] plan, please call the following number at least 2 days in advance of your appointment: [phone]  Call the [facility] at [phone], at any time, 24 hours a day, 7 days a week. If you are in danger or need immediate medical attention call 911.  If you would like help to quit smoking, call 1-800-QUIT-NOW 3236334440) OR Espaol: 1-855-Djelo-Ya (4-270-623-7628) o para ms informacin haga clic aqu or Text READY to 200-400 to register via text  Gerald Olson - following are the goals we discussed in your visit today: See Patient Goals also listed below under [name].  Goals Addressed             This Visit's Progress    RNCM - Chronic Back Pain Management       Timeframe:  Long-Range Goal Priority:  High Start Date:   05/09/2021                          Expected End Date: 11/04/2021         Patient Goals: Patient will self administer medications as prescribed Patient will attend all scheduled provider appointments Patient will call pharmacy for medication refills Patient will continue to perform ADL's independently Patient will continue to perform IADL's independently Patient will  call provider office for new concerns or questions Patient will work with BSW to address care coordination needs and will continue to work with the clinical team to address health care and disease management related needs.                      RNCM - CML Disease Progression Minimized or Managed       Timeframe:  Long-Range Goal Priority:  High Start Date:     05/09/2021                       Expected End Date:  11/04/2021     Patient Goals: Patient will self administer medications as prescribed Patient will attend all scheduled provider appointments Patient will call pharmacy for medication refills Patient will continue to perform ADL's independently Patient will continue to perform IADL's independently Patient will call provider office for new concerns or questions Patient will work with BSW to address care coordination needs and will continue to work with the clinical team to address health care and disease management related needs.                              RNCM - Glycemic Management & Monitored       Timeframe:  Long-Range Goal Priority:  High Start Date:     05/09/2021  Expected End Date:  11/04/2021  Patient Goals: Patient will self administer medications as prescribed Patient will attend all scheduled provider appointments Patient will call pharmacy for medication refills Patient will continue to perform ADL's independently Patient will continue to perform IADL's independently Patient will call provider office for new concerns or questions Patient will work with BSW to address care coordination needs and will continue to work with the clinical team to address health care and disease management related needs.                 RNCM - Hypertension Monitored and Managed       Timeframe:  Long-Range Goal Priority:  High Start Date:  05/09/2021                           Expected End Date:  11/04/2021  Patient Goals Patient will self administer medications  as prescribed Patient will attend all scheduled provider appointments Patient will call pharmacy for medication refills Patient will continue to perform ADL's independently Patient will continue to perform IADL's independently Patient will call provider office for new concerns or questions Patient will work with BSW to address care coordination needs and will continue to work with the clinical team to address health care and disease management related needs.                      Please see education materials related to today's visit provided by MyChart link.  The patient verbalized understanding of today's visit information and education and will review information via My Chart link.  The Managed Medicaid care management team will reach out to the patient again over the next 30 days.   [name] RN, BSN Community Care Coordinator [facility] Mobile: [phone]   Following is a copy of your plan of care:  Care Plan : [facility] Plan of Care  Updates made by [name], RN since 08/29/2021 12:00 AM     Problem: Chronic Disease Management and Care Coordination Needs for DM, HTN, CML and Back Pain      Long-Range Goal: Plan of Care for Chronic Disease management and Care Coordination Needs   Start Date: 05/09/2021  Expected End Date: 11/04/2021  Priority: High  Note:   Current Barriers:  Knowledge Deficits related to plan of care for management of HTN, DMII, and CML and Chronic Back Pain  Care Coordination needs related to Financial constraints related to paying back taxes on house and Limited social support  Chronic Disease Management support and education needs related to HTN, DMII, and CML and Chronic Back Pain Financial Constraints.  Transportation barriers Non-adherence to prescribed medication regimen No Advanced Directives in place  RNCM Clinical Goal(s):  Patient will verbalize understanding of plan for management of HTN, DMII,  and CML and Chronic Back Pain verbalize basic understanding of HTN, DMII, and CML and Chronic Back Pain disease process and self health management plan  take all medications exactly as prescribed and will call provider for medication related questions attend all scheduled medical appointments: 08/31/2021 C[facility] - Patient canceling; 09/11/2021 Dr Darden Dates Office Visit; 09/20/2021 Dr Loanne [name] and New PCP office visit. demonstrate ongoing adherence to prescribed treatment plan for HTN, DMII, and CML and Chronic Back Pain as evidenced by daily monitoring and recording of CBG, adherence to ADA/ carb modified diet, adherence to prescribed medication regimen, contacting provider for new or worsened symptoms or questions  demonstrate ongoing health management independence  continue to work with RN Care Manager to address care management and care coordination needs related to HTN, DMII, and CML and Chronic Back Pain  work with pharmacist to address complex medication regimen  related to HTN, DMII, and CML and Chronic Back Pain work with Education officer, museum to address financial constraints related to overdue payment of back taxes and potential risk of losing home, Limited social support and counseling for anxiety/stress related to the management of HTN, DMII, and CML and Chronic Back Pain. collaborate with the care management team towards completion of advanced directives  through collaboration with RN Care manager, provider, and care team.   Interventions: Inter-disciplinary care team collaboration (see longitudinal plan of care) Evaluation of current treatment plan related to  self management and patient's adherence to plan as established by provider   Diabetes:  (Status: Goal on Track (progressing): YES.)  Lab Results  Component Value Date   HGBA1C 5.9 (A) 06/19/2021    Assessed patient's understanding of A1c goal: <7% Continue to provided education to patient about basic DM disease process; Reviewed medications  with patient and discussed importance of medication adherence.   Counseled on importance of regular laboratory monitoring as prescribed;        Discussed plans with patient for ongoing care management follow up and provided patient with direct contact information for care management team;      Reviewed scheduled/upcoming provider appointments including: see scheduled January appointments under Clinical Goals        Patient reports blood sugars are doing well.  Today's fasting blood sugar 114 at 1:00 pm. Another non-fasting blood sugar reading was 147 recently  Patient reports no hypoglycemic or hyperglycemic episodes.       Call provider for findings outside established parameters;       Referral made to pharmacy team previously for assistance with complex medication regimen.  [facility] pharmacist continue to make telephone contact with patient.        Review of patient status, including review of consultants reports, relevant laboratory and other test results, and medications completed.    Patient reports new foot wound on bottom of left foot.  Patient learned he had a "chunk of skin the size of a fingernail" removed from the bottom of his left foot which occurred around 08/21/21 or 08/22/21.  Patient did not feel any injury to his foot and it most likely occurred when he was walking barefoot in his home.   Patient was first aware of this foot wound when he noticed blood on his sock. Patient first used mercurochrome to clean the wound but it started to look infected after a couple of days and then he used black seed oil with noted improvement.  Educated patient on the importance of inspecting his feet daily especially left foot wound due to diagnosis of diabetes with neuropathy.  Patient verbalized understanding.          Chronic Myeloid Leukemia (CML)  (Status: Goal on Track (progressing): YES.) Evaluation of current treatment plan related to  CML and patient's adherence to plan as established by provider.  Patient continues to have suspected black mold in his bedroom due to warm & cool temperature.  Patient has worked on removing black mold with use of vinegar. Discussed plans with patient for ongoing care management follow up and provided patient with direct contact information for care management team Reviewed medications with patient and discussed the importance of taking all medications as prescribed; Pharmacy  referral for complex medication regimen.  Awaiting rescheduled appointment with [facility] pharmacist.  Hypertension: (Status: Goal on Track (progressing): YES.) Last practice recorded BP readings:    BP Readings from Last 3 Encounters:  06/20/21 135/87  06/20/21 122/80  06/19/21 120/76  Most recent eGFR/CrCl: No results found for: EGFR  No components found for: CRCL  Evaluation of current treatment plan related to hypertension self management and patient's adherence to plan as established by provider;   Reviewed medications with patient and discussed importance of compliance;  Counseled on the importance of exercise goals with target of 150 minutes per week but understand how patient's increased fatigue impacts the amount of exercise he can do.  Patient reports he did walk up & down a sloped grassy area on New Year Eve which caused him intense pain the next day. Discussed plans with patient for ongoing care management follow up and provided patient with direct contact information for care management team; Patient reports his recent blood pressure reading was 126/82 and another blood pressure reading of 147/91 that decreased following a recent medical appointment.  Advised to contact PCP for findings outside established parameters.  Reviewed scheduled/upcoming provider appointments including:See Clinical Goeal above for future appointments  Pain:  (Status: Goal on Track (progressing): YES.) Pain assessment performed.  Patient reports increased mid to lower back pain following walking on sloped  grassy area on New Year's Eve.  Patient reports intense pain of a "10" the next day.  He rates pain as a "8" today. Pain rating is on a Pain Scale of 0 - 10. Using pain medication with fair relief.  Medications reviewed.  Reviewed provider established plan for pain management; Discussed importance of adherence to all scheduled medical appointments;  Patient Goals/Self-Care Activities: Patient will self administer medications as prescribed as evidenced by self report/primary caregiver report  Patient will attend all scheduled provider appointments as evidenced by clinician review of documented attendance to scheduled appointments and patient/caregiver report Patient will call pharmacy for medication refills as evidenced by patient report and review of pharmacy fill history as appropriate Patient will continue to perform ADL's independently as evidenced by patient/caregiver report Patient will continue to perform IADL's independently as evidenced by patient/caregiver report Patient will call provider office for new concerns or questions as evidenced by review of documented incoming telephone call notes and patient report Patient will work with BSW to address care coordination needs and will continue to work with the clinical team to address health care and disease management related needs as evidenced by documented adherence to scheduled care management/care coordination appointments - check blood sugar at prescribed times: twice daily - enter blood sugar readings and medication or insulin into daily log - take the blood sugar log to all doctor visits - check blood pressure weekly - write blood pressure results in a log or diary - keep a blood pressure log - take blood pressure log to all doctor appointments - call doctor for signs and symptoms of high blood pressure - keep all doctor appointments - take medications for blood pressure exactly as prescribed

## 2021-08-29 NOTE — Patient Outreach (Signed)
Medicaid Managed Care   Nurse Care Manager Note  08/29/2021 Name:  Gerald Olson MRN:  [id number] DOB:  1966-04-13  Gerald Olson is an 56 y.o. year old male who is a primary patient of Thedore Mins, Ria Comment, [place].  The [facility] Managed Care Coordination team was consulted for assistance with:    HTN DMII CML Chronic Back Pain  Gerald Olson was given information about Medicaid Managed Care Coordination team services today. Gerald Olson Patient agreed to services and verbal consent obtained.  Engaged with patient by telephone for follow up visit in response to provider referral for case management and/or care coordination services.   Assessments/Interventions:  Review of past medical history, allergies, medications, health status, including review of consultants reports, laboratory and other test data, was performed as part of comprehensive evaluation and provision of chronic care management services.  SDOH (Social Determinants of Health) assessments and interventions performed:   Care Plan  No Known Allergies  Medications Reviewed Today     Reviewed by [name], RN (Case Manager) on 08/29/21 at [place] List Status: <None>   Medication Order Taking? Sig Documenting Provider Last Dose Status Informant  acetaZOLAMIDE ER (DIAMOX) 500 MG capsule [id number]  Take 1 capsule (500 mg total) by mouth 2 (two) times daily   Active   cyclobenzaprine (FLEXERIL) 5 MG tablet [id number]  Take 1 tablet by mouth daily at bed time as needed for back pain   Active    Patient not taking:   Discontinued 08/09/20 1309 (Patient Preference)   imatinib (GLEEVEC) 400 MG tablet [id number]  Take 1 tablet (400 mg total) by mouth daily. [name], RPH-CPP  Active Self  insulin glargine (LANTUS SOLOSTAR) 100 UNIT/ML Solostar Pen [id number]  Inject 10 Units into the skin every morning. [name], MD  Active   Insulin Pen Needle 32G X 4 MM MISC [id number]  USE TO INJECT VICTOZA ONCE DAILY. [name], MD   Active Self  lisinopril (ZESTRIL) 10 MG tablet [id number]  Take 1 tablet (10 mg total) by mouth daily.   Active   traMADol (ULTRAM) 50 MG tablet [id number]  TAKE 1 - 2 TABLETS BY MOUTH 3 TIMES DAILY AS NEEDED FOR SEVERE PAIN. MAY CAUSE DROWSINESS. 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 08/09/2020   Mesenteric lymphadenopathy 06/14/2020   Splenomegaly- mild  06/14/2020   History of cannabis abuse 06/14/2020   Recurrent major depressive disorder, in partial remission ([place]) 06/09/2020   Anxiety 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 06/09/2020   Alcohol use disorder, moderate, dependence ([place]) 06/09/2020   Frequent falls 05/23/2020   Foraminal stenosis of cervical region 05/09/2020   Diabetic polyneuropathy    ADHD (attention deficit hyperactivity disorder)    Generalized anxiety disorder    Major depressive disorder    Cervical spondylosis 09/22/2019   Essential hypertension, benign 75/64/3329   Alcoholic cirrhosis of liver 06/02/2018   Type 2 diabetes mellitus with hyperglycemia 04/25/2018    Conditions to be addressed/monitored per PCP order:  HTN, DMII, and  CML and Chronic Back Pain  Care Plan : RNCM Plan of Care  Updates made by [name], RN since 08/29/2021 12:00 AM     Problem: Chronic Disease Management and Care Coordination Needs for DM, HTN, CML and Back Pain      Long-Range Goal: Plan of Care for Chronic Disease management and Care Coordination Needs   Start Date: 05/09/2021  Expected End Date: 11/04/2021  Priority: High  Note:    Current Barriers:  Knowledge Deficits related to plan of care for management of HTN, DMII, and CML and Chronic Back Pain  Care Coordination needs related to Financial constraints related to paying back taxes on house and Limited social support  Chronic Disease Management support and education needs related to HTN, DMII, and CML and Chronic Back Pain Financial Constraints.  Transportation barriers Non-adherence to prescribed medication regimen No Advanced Directives in place  RNCM Clinical Goal(s):  Patient will verbalize understanding of plan for management of HTN, DMII, and CML and Chronic Back Pain verbalize basic understanding of HTN, DMII, and CML and Chronic Back Pain disease process and self health management plan  take all medications exactly as prescribed and will call provider for medication related questions attend all scheduled medical appointments: 08/31/2021 [facility] - Patient canceling; 09/11/2021 Dr Darden Dates Office Visit; 09/20/2021 Dr Loanne [name] and New PCP office visit. demonstrate ongoing adherence to prescribed treatment plan for HTN, DMII, and CML and Chronic Back Pain as evidenced by daily monitoring and recording of CBG, adherence to ADA/ carb modified diet, adherence to prescribed medication regimen, contacting provider for new or worsened symptoms or questions  demonstrate ongoing health management independence  continue to work with RN Care Manager to address care management and care coordination needs related to HTN, DMII, and CML and Chronic Back Pain  work with pharmacist to address complex medication regimen  related to HTN, DMII, and CML and Chronic Back Pain work with Education officer, museum to address financial constraints related to overdue payment of back taxes and potential risk of losing home, Limited social support and counseling for anxiety/stress related to the management of HTN, DMII, and CML and Chronic Back Pain. collaborate with the care management team towards completion of  advanced directives  through collaboration with RN Care manager, provider, and care team.   Interventions: Inter-disciplinary care team collaboration (see longitudinal plan of care) Evaluation of current treatment plan related to  self management and patient's adherence to plan as established by provider   Diabetes:  (Status: Goal on Track (progressing): YES.)  Lab Results  Component Value Date   HGBA1C 5.9 (A) 06/19/2021    Assessed patient's understanding of A1c goal: <7% Continue to provided education to patient about basic DM disease process; Reviewed medications with patient and discussed importance of medication adherence.   Counseled on importance of regular laboratory monitoring as prescribed;        Discussed plans with patient for ongoing care management follow up and provided patient with direct contact information for care management team;      Reviewed scheduled/upcoming provider appointments including: see scheduled January appointments under Clinical Goals        Patient reports blood sugars are doing well.  Today's fasting blood sugar 114 at 1:00 pm. Another non-fasting blood sugar reading was 147 recently  Patient reports no hypoglycemic or hyperglycemic episodes.       Call provider for findings outside established parameters;       Referral made to pharmacy team previously for assistance  with complex medication regimen.  [facility] pharmacist continue to make telephone contact with patient.        Review of patient status, including review of consultants reports, relevant laboratory and other test results, and medications completed.    Patient reports new foot wound on bottom of left foot.  Patient learned he had a "chunk of skin the size of a fingernail" removed from the bottom of his left foot which occurred around 08/21/21 or 08/22/21.  Patient did not feel any injury to his foot and it most likely occurred when he was walking barefoot in his home.   Patient was first aware of  this foot wound when he noticed blood on his sock. Patient first used mercurochrome to clean the wound but it started to look infected after a couple of days and then he used black seed oil with noted improvement.  Educated patient on the importance of inspecting his feet daily especially left foot wound due to diagnosis of diabetes with neuropathy.  Patient verbalized understanding.          Chronic Myeloid Leukemia (CML)  (Status: Goal on Track (progressing): YES.) Evaluation of current treatment plan related to  CML and patient's adherence to plan as established by provider. Patient continues to have suspected black mold in his bedroom due to warm & cool temperature.  Patient has worked on removing black mold with use of vinegar. Discussed plans with patient for ongoing care management follow up and provided patient with direct contact information for care management team Reviewed medications with patient and discussed the importance of taking all medications as prescribed; Pharmacy referral for complex medication regimen.  Awaiting rescheduled appointment with [facility] pharmacist.  Hypertension: (Status: Goal on Track (progressing): YES.) Last practice recorded BP readings:    BP Readings from Last 3 Encounters:  06/20/21 135/87  06/20/21 122/80  06/19/21 120/76  Most recent eGFR/CrCl: No results found for: EGFR  No components found for: CRCL  Evaluation of current treatment plan related to hypertension self management and patient's adherence to plan as established by provider;   Reviewed medications with patient and discussed importance of compliance;  Counseled on the importance of exercise goals with target of 150 minutes per week but understand how patient's increased fatigue impacts the amount of exercise he can do.  Patient reports he did walk up & down a sloped grassy area on New Year Eve which caused him intense pain the next day. Discussed plans with patient for ongoing care management  follow up and provided patient with direct contact information for care management team; Patient reports his recent blood pressure reading was 126/82 and another blood pressure reading of 147/91 that decreased following a recent medical appointment.  Advised to contact PCP for findings outside established parameters.  Reviewed scheduled/upcoming provider appointments including:See Clinical Goeal above for future appointments  Pain:  (Status: Goal on Track (progressing): YES.) Pain assessment performed.  Patient reports increased mid to lower back pain following walking on sloped grassy area on New Year's Eve.  Patient reports intense pain of a "10" the next day.  He rates pain as a "8" today. Pain rating is on a Pain Scale of 0 - 10. Using pain medication with fair relief.  Medications reviewed.  Reviewed provider established plan for pain management; Discussed importance of adherence to all scheduled medical appointments;  Patient Goals/Self-Care Activities: Patient will self administer medications as prescribed as evidenced by self report/primary caregiver report  Patient will attend all scheduled provider  appointments as evidenced by clinician review of documented attendance to scheduled appointments and patient/caregiver report Patient will call pharmacy for medication refills as evidenced by patient report and review of pharmacy fill history as appropriate Patient will continue to perform ADL's independently as evidenced by patient/caregiver report Patient will continue to perform IADL's independently as evidenced by patient/caregiver report Patient will call provider office for new concerns or questions as evidenced by review of documented incoming telephone call notes and patient report Patient will work with BSW to address care coordination needs and will continue to work with the clinical team to address health care and disease management related needs as evidenced by documented adherence  to scheduled care management/care coordination appointments - check blood sugar at prescribed times: twice daily - enter blood sugar readings and medication or insulin into daily log - take the blood sugar log to all doctor visits - check blood pressure weekly - write blood pressure results in a log or diary - keep a blood pressure log - take blood pressure log to all doctor appointments - call doctor for signs and symptoms of high blood pressure - keep all doctor appointments - take medications for blood pressure exactly as prescribed       Follow Up:  Patient agrees to Care Plan and Follow-up.  Plan: The Managed Medicaid care management team will reach out to the patient again over the next 30 days.  Date/time of next scheduled RN care management/care coordination outreach:  09/27/2021 at 3:00pm  [place], [place] Coordinator [facility] Mobile: [phone]

## 2021-08-30 ENCOUNTER — Telehealth: Payer: Self-pay | Admitting: Oncology

## 2021-08-30 NOTE — Telephone Encounter (Signed)
Pt called to see if he could change the date that he comes for lab work. Instead of 1-26, wants to come late on 1-25. Call back at [phone]

## 2021-08-31 ENCOUNTER — Other Ambulatory Visit: Payer: Self-pay | Admitting: Urology

## 2021-09-04 ENCOUNTER — Other Ambulatory Visit: Payer: Self-pay

## 2021-09-04 NOTE — Patient Outreach (Signed)
Medicaid Managed Care Social Work Note  09/04/2021 Name:  Gerald Olson MRN:  [id number] DOB:  08/01/1966  Gerald Olson is an 56 y.o. year old male who is a primary patient of Thedore Mins, Ria Comment, [place].  The [facility] Managed Care Coordination team was consulted for assistance with:  Community Resources   Gerald Olson was given information about Medicaid Managed Care Coordination team services today. Gerald Olson Patient agreed to services and verbal consent obtained.  Engaged with patient  for by telephone forfollow up visit in response to referral for case management and/or care coordination services.   Assessments/Interventions:  Review of past medical history, allergies, medications, health status, including review of consultants reports, laboratory and other test data, was performed as part of comprehensive evaluation and provision of chronic care management services.  SDOH: (Social Determinant of Health) assessments and interventions performed: BSW contacted patient regarding housing repair, dental, and tax resources. BSW provided resources for [place] for assistance with filing taxes for free. Patient asked for resources to be emailed to him. BSW will email and send via mychart. No other resources are needed at this time.    Advanced Directives Status:  Not addressed in this encounter.  Care Plan                 No Known Allergies  Medications Reviewed Today     Reviewed by [name], RN (Case Manager) on 08/29/21 at [place] List Status: <None>   Medication Order Taking? Sig Documenting Provider Last Dose Status Informant  acetaZOLAMIDE ER (DIAMOX) 500 MG capsule [id number]  Take 1 capsule (500 mg total) by mouth 2 (two) times daily   Active   cyclobenzaprine (FLEXERIL) 5 MG tablet [id number]  Take 1 tablet by mouth daily at bed time as needed for back pain   Active    Patient not taking:   Discontinued 08/09/20 1309 (Patient  Preference)   imatinib (GLEEVEC) 400 MG tablet [id number]  Take 1 tablet (400 mg total) by mouth daily. [name], RPH-CPP  Active Self  insulin glargine (LANTUS SOLOSTAR) 100 UNIT/ML Solostar Pen [id number]  Inject 10 Units into the skin every morning. [name], MD  Active   Insulin Pen Needle 32G X 4 MM MISC [id number]  USE TO INJECT VICTOZA ONCE DAILY. [name], MD  Active Self  lisinopril (ZESTRIL) 10 MG tablet [id number]  Take 1 tablet (10 mg total) by mouth daily.   Active   traMADol (ULTRAM) 50 MG tablet [id number]  TAKE 1 - 2 TABLETS BY MOUTH 3 TIMES DAILY AS NEEDED FOR SEVERE PAIN. MAY CAUSE DROWSINESS. 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 08/09/2020   Mesenteric lymphadenopathy 06/14/2020   Splenomegaly- mild  06/14/2020   History of cannabis abuse 06/14/2020   Recurrent major depressive disorder, in partial remission ([place]) 06/09/2020   Anxiety 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 06/09/2020   Alcohol use disorder, moderate, dependence ([place]) 06/09/2020   Frequent falls 05/23/2020   Foraminal stenosis  of cervical region 05/09/2020   Diabetic polyneuropathy    ADHD (attention deficit hyperactivity disorder)    Generalized anxiety disorder    Major depressive disorder    Cervical spondylosis 09/22/2019   Essential hypertension, benign 69/48/5462   Alcoholic cirrhosis of liver 06/02/2018   Type 2 diabetes mellitus with hyperglycemia 04/25/2018    Conditions to be addressed/monitored per PCP order:   community resources  There are  no care plans that you recently modified to display for this patient.   Follow up:  Patient agrees to Care Plan and Follow-up.  Plan: The Managed Medicaid care management team will reach out to the patient again over the next 30 days.  Date/time of next scheduled Social Work care management/care coordination outreach:  10/05/21  [name], Arita Miss, [place] Medicaid Team  [phone]

## 2021-09-04 NOTE — Patient Instructions (Signed)
Visit Information  Gerald Olson was given information about Medicaid Managed Care team care coordination services as a part of their Healthy [facility] Medicaid benefit. Gerald Olson verbally consented to engagement with the [facility] Managed Care team.   If you are experiencing a medical emergency, please call 911 or report to your local emergency department or urgent care.   If you have a non-emergency medical problem during routine business hours, please contact your provider's office and ask to speak with a nurse.   For questions related to your Healthy [facility] health plan, please call: [phone] or visit the homepage here: [email or web address]  If you would like to schedule transportation through your Healthy [facility] plan, please call the following number at least 2 days in advance of your appointment: [phone]  Call the [place] at [phone], at any time, 24 hours a day, 7 days a week. If you are in danger or need immediate medical attention call 911.  If you would like help to quit smoking, call 1-800-QUIT-NOW 732-589-6543) OR Espaol: 1-855-Djelo-Ya (4-497-530-0511) o para ms informacin haga clic aqu or Text READY to 200-400 to register via text  Gerald Olson - following are the goals we discussed in your visit today:   Goals Addressed   None      Social Worker will follow up in 30 days .   Gerald Olson, BSW, [place] Managed Medicaid Team  [phone]   Following is a copy of your plan of care:  There are no care plans that you recently modified to display for this patient.

## 2021-09-11 ENCOUNTER — Encounter: Payer: Self-pay | Admitting: Adult Health

## 2021-09-11 ENCOUNTER — Ambulatory Visit: Payer: Medicaid Other | Admitting: Adult Health

## 2021-09-11 ENCOUNTER — Other Ambulatory Visit: Payer: Self-pay

## 2021-09-11 VITALS — BP 152/84 | HR 102 | Ht 74.0 in | Wt 219.0 lb

## 2021-09-11 DIAGNOSIS — M5416 Radiculopathy, lumbar region: Secondary | ICD-10-CM

## 2021-09-11 DIAGNOSIS — M48061 Spinal stenosis, lumbar region without neurogenic claudication: Secondary | ICD-10-CM | POA: Diagnosis not present

## 2021-09-11 DIAGNOSIS — R4189 Other symptoms and signs involving cognitive functions and awareness: Secondary | ICD-10-CM

## 2021-09-11 DIAGNOSIS — E1142 Type 2 diabetes mellitus with diabetic polyneuropathy: Secondary | ICD-10-CM

## 2021-09-11 DIAGNOSIS — M4317 Spondylolisthesis, lumbosacral region: Secondary | ICD-10-CM | POA: Diagnosis not present

## 2021-09-11 MED ORDER — AMITRIPTYLINE HCL 25 MG PO TABS
50.0000 mg | ORAL_TABLET | Freq: Every day | ORAL | 11 refills | Status: DC
  Filled 2021-09-11 – 2021-09-12 (×2): qty 60, 30d supply, fill #0

## 2021-09-11 NOTE — Patient Instructions (Addendum)
Your Plan:  You will be called to schedule a re-evaluation with Dr. Ellene Route   Start amitriptyline 25mg  nightly for 1 week then increase to 50mg  nightly    Follow up in 6 months or call earlier if needed       Thank you for coming to see Gerald Olson at [facility]. I hope we have been able to provide you high quality care today.  You may receive a patient satisfaction survey over the next few weeks. We would appreciate your feedback and comments so that we may continue to improve ourselves and the health of our patients.    Amitriptyline Tablets What is this medication? AMITRIPTYLINE (a mee TRIP ti leen) treats depression. It increases the amount of serotonin and norepinephrine in the brain, hormones that help regulate mood. It belongs to a group of medications called tricyclic antidepressants (TCAs). This medicine may be used for other purposes; ask your health care provider or pharmacist if you have questions. COMMON BRAND NAME(S): Elavil, Vanatrip What should I tell my care team before I take this medication? They need to know if you have any of these conditions: An alcohol problem Asthma, trouble breathing Bipolar disorder or schizophrenia Difficulty passing urine, prostate trouble Glaucoma Heart disease or previous heart attack Liver disease Over active thyroid Seizures Thoughts or plans of suicide, a previous suicide attempt, or family history of suicide attempt An unusual or allergic reaction to amitriptyline, other medications, foods, dyes, or preservatives Pregnant or trying to get pregnant Breast-feeding How should I use this medication? Take this medication by mouth with a drink of water. Follow the directions on the prescription label. You can take the tablets with or without food. Take your medication at regular intervals. Do not take it more often than directed. Do not stop taking this medication suddenly except upon the advice of your care team.  Stopping this medication too quickly may cause serious side effects or your condition may worsen. A special MedGuide will be given to you by the pharmacist with each prescription and refill. Be sure to read this information carefully each time. Talk to your care team regarding the use of this medication in children. Special care may be needed. Overdosage: If you think you have taken too much of this medicine contact a poison control center or emergency room at once. NOTE: This medicine is only for you. Do not share this medicine with others. What if I miss a dose? If you miss a dose, take it as soon as you can. If it is almost time for your next dose, take only that dose. Do not take double or extra doses. What may interact with this medication? Do not take this medication with any of the following: Arsenic trioxide Certain medications used to regulate abnormal heartbeat or to treat other heart conditions Cisapride Droperidol Halofantrine Linezolid MAOIs like Carbex, Eldepryl, Marplan, Nardil, and Parnate Methylene blue Other medications for mental depression Phenothiazines like perphenazine, thioridazine and chlorpromazine Pimozide Probucol Procarbazine Sparfloxacin St. John's Wort This medication may also interact with the following: Atropine and related medications like hyoscyamine, scopolamine, tolterodine and others Barbiturate medications for inducing sleep or treating seizures, like phenobarbital Cimetidine Disulfiram Ethchlorvynol Thyroid hormones such as levothyroxine Ziprasidone This list may not describe all possible interactions. Give your health care provider a list of all the medicines, herbs, non-prescription drugs, or dietary supplements you use. Also tell them if you smoke, drink alcohol, or use illegal drugs. Some items may interact with your medicine. What  should I watch for while using this medication? Tell your care team if your symptoms do not get better or if  they get worse. Visit your care team for regular checks on your progress. Because it may take several weeks to see the full effects of this medication, it is important to continue your treatment as prescribed by your care team. Patients and their families should watch out for new or worsening thoughts of suicide or depression. Also watch out for sudden changes in feelings such as feeling anxious, agitated, panicky, irritable, hostile, aggressive, impulsive, severely restless, overly excited and hyperactive, or not being able to sleep. If this happens, especially at the beginning of treatment or after a change in dose, call your care team. You may get drowsy or dizzy. Do not drive, use machinery, or do anything that needs mental alertness until you know how this medication affects you. Do not stand or sit up quickly, especially if you are an older patient. This reduces the risk of dizzy or fainting spells. Alcohol may interfere with the effect of this medication. Avoid alcoholic drinks. Do not treat yourself for coughs, colds, or allergies without asking your care team for advice. Some ingredients can increase possible side effects. Your mouth may get dry. Chewing sugarless gum or sucking hard candy, and drinking plenty of water will help. Contact your care team if the problem does not go away or is severe. This medication may cause dry eyes and blurred vision. If you wear contact lenses you may feel some discomfort. Lubricating drops may help. See your eye doctor if the problem does not go away or is severe. This medication can cause constipation. Try to have a bowel movement at least every 2 to 3 days. If you do not have a bowel movement for 3 days, call your care team. This medication can make you more sensitive to the sun. Keep out of the sun. If you cannot avoid being in the sun, wear protective clothing and use sunscreen. Do not use sun lamps or tanning beds/booths. What side effects may I notice from  receiving this medication? Side effects that you should report to your care team as soon as possible: Allergic reactions--skin rash, itching, hives, swelling of the face, lips, tongue, or throat Heart rhythm changes-- fast or irregular heartbeat, dizziness, feeling faint or lightheaded, chest pain, trouble breathing Serotonin syndrome--irritability, confusion, fast or irregular heartbeat, muscle stiffness, twitching muscles, sweating, high fever, seizure, chills, vomiting, diarrhea Sudden eye pain or change in vision such as blurry vision, seeing halos around lights, vision loss Thoughts of suicide or self-harm, worsening mood, feelings of depression Side effects that usually do not require medical attention (report to your care team if they continue or are bothersome): Change in appetite or weight Change in sex drive or performance Constipation Dizziness Drowsiness Dry mouth Tremors This list may not describe all possible side effects. Call your doctor for medical advice about side effects. You may report side effects to FDA at 1-800-FDA-1088. Where should I keep my medication? Keep out of the reach of children and pets. Store at room temperature between 20 and 25 degrees C (68 and 77 degrees F). Throw away any unused medication after the expiration date. NOTE: This sheet is a summary. It may not cover all possible information. If you have questions about this medicine, talk to your doctor, pharmacist, or health care provider.  2022 [employer] (2020-09-09 00:00:00)

## 2021-09-11 NOTE — Progress Notes (Signed)
[facility] [address] [place]. [place] [address] [phone]       OFFICE FOLLOW UP VISIT NOTE  Gerald Olson Date of Birth:  June 01, 1966 Medical Record Number:  [id number]   Referring MD: Cammie Fulp Reason for Referral: Neuropathy   Chief Complaint  Patient presents with   Follow-up    RM 3 alone here for 6 month f/u pt reports sx are the same no improvement has been noted.       HPI:  Update 09/11/2021 JM: Patient returns for 57-month follow-up unaccompanied. Reports continued left leg pain persistence since decompression procedure 12/2020. Reports self discontinued topamax around December as he didn't believe any benefit - denies worsening since stopping - continues tramadol only 2-3x per week managed by PCP. Does report some worsening right side back pain back in September but feels he may have over did it as he was trying to increase exercise. Seen by ortho 06/2021 who advised to f/u with neurosurgery but has not yet seen them - he was also advised of this at prior visit 6 months ago. Obtained xrays during ortho visit which showed in-place hardware from L5-S1 fusion and no acute pathology per ortho report (unable to personally view via epic).  No further concerns at this time    History provided for reference purposes only Update 03/01/2021 JM: Gerald Olson returns for 30-month follow-up regarding neuropathy and cognitive complaints.  Since prior visit, underwent surgical decompression and arthrodesis at L5-S1 by Dr. Ellene Route for spondylolisthesis L5-S1 with lumbar radiculopathy.  He reports increased pain since procedure particularly left leg -he reports recently being seen by Dr. Ellene Route and was told to further discuss pain management options with our office today (again, per patient report). He has remained on topiramate 50 mg AM and 100 mg PM in combination with alpha-lipoic acid which have previously been stable for diabetic neuropathy.  He has no longer on  duloxetine.  He discusses use of Percocet after procedure but this makes him too drowsy.  He was seen by his PCP who placed him on tramadol with benefit -he reports previously being seen by pain management but does not wish to go back due to restriction of other providers managing pain or prescribing medications.  He continues to follow with psychiatry for depression with recent visit 6/14.  Reports cognition stable since prior visit.  He was also seen by PCP 6/14 but unable to access OV note for review.  No further concerns at this time  Update 08/30/2020 JM: Gerald Olson returns for follow-up after being seen 4 months prior for neuropathy and cognitive complaints.  Remains on topiramate 50 mg twice daily for diabetic neuropathy initially providing benefit and even improving especially in combination with alpha-lipoic acid but unfortunately suffered a fall approximately 1 month ago not experiencing thoracic pain and worsening left leg radiculopathy. He has follow-up visit with orthopedics on Friday for further evaluation. Cognition stable without worsening.  Initiated duloxetine at prior visit in hopes of benefit of depression as well as paresthesias but he self discontinued as he did not feel as though this was beneficial and reported dizziness.  He routinely is followed by psychiatry for depression and anxiety but currently declines interest in antidepression medications. He continues to follow with orthopedics Dr. Rodell Perna for spinal and lumbar stenosis previously participating in outpatient PT but currently on hold due to recent fall. 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 No further concerns at this time.  Update 04/27/2020 JM: Gerald Olson returns today for neuropathy follow-up Since prior visit, he has noticed some improvement in regards to right leg > left leg neuropathy but does report continued numbness sensation.  He believes  Topamax 25 mg nightly has been beneficial and he has tolerated well.  He did trial taking 2 tablets for a couple of nights with further improvement and denies side effects.   He is also been having increased cervical pain radiating down left arm.  He has not contacted Dr. [name] in regards to worsening symptoms over the past 2 months.  He also reports right shoulder burning type pain that can wake him up while sleeping.  He reports he has not slept in the past 3 nights due to neck and shoulder pain.  He questions possibility of fibromyalgia due to multiple pain locations. Awaiting lumbar and cervical procedure once diabetes becomes well controlled but he continues to have difficulty.  Continues to follow with endocrinology. He also complains of cognitive concerns including short-term and long-term memory, delayed recall and occasional word finding difficulty.  He denies any prior stroke history or TBI.  He did have neurocognitive evaluation by Dr. Melvyn Novas on 03/31/2020 did show just chronic weakness across processing speed and variability noted approximately complex attention as well as encoding and retrieval aspects of verbal and visual memory.  Etiology potentially pronounced psychiatric distress, ongoing sleep dysfunction, chronic pain, history of ADHD, vascular factors such as uncontrolled diabetes and potentially vascular changes would affect similar areas of cognitive functioning which could contribute to ongoing deficits.  No concerns for early onset neurodegenerative illness such as Alzheimer's disease at the time of testing. Recommendations of obtaining MRI for further evaluation as well as medication and psychotherapy for treatment of anxiety and depression as well as pain management and different lifestyle options (heavy alcohol intake, heavy caffeine intake, and THC use).  Update 01/28/2020 Dr. Leonie Man : He returns for follow-up after last visit 6 months ago.  He states that he did increase the gabapentin  but was taking it only twice daily.  It helps with the pins-and-needles sensation but is still in constant pain as well as feels numb.  In the last few weeks he feels symptoms may gotten worse.  Is also gained about 20 pounds this year.  He also complains of involuntary myoclonic jerk-like movements in his left hand.  He had EMG nerve conduction study done on 10/22/2019 by Dr. 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  Lab works for reversible causes of neuropathy on 10/08/2019 pretty much normal except for low vitamin D levels of 114.  Patient states he has known low vitamin D level since last 20 years and was on vitamin D replacement however few weeks prior to seeing me and last visit here ran out of vitamin D and had not restarted taking it due to financial reasons.  He has chronic back pain with left leg radiculopathy and he plans on having back surgery but needs to lose some weight prior to that.  He has been taking while like to switch seems to help his chronic pain.  He has not tried Topamax and is willing to try that.  Initial visit 10/08/2019 Dr. Leonie Man: Gerald Olson is a 57 year old Caucasian male with past medical history of diabetes, hypertension, obesity, degenerative arthritis and chronic back and neck  pain who has been having lower extremity paresthesias for the last 6 months which seem to be progressive.  He complains of constant burning, tingling and pins and needle sensation.  At times this becomes bearable.  This is present throughout the day.  He recently started taking gabapentin 100 mg twice daily which is not being effective.  He is also noticed that his balance is poor and has had frequent falls.  In fact he had a fall yesterday and today seems to be in acute pain and his gait is limited.  He has a longstanding history of degenerative cervical and lumbar spine  disease.  He has been seen by orthopedic surgeon wants to operate on his neck but his diabetes is out of control and is working with endocrinologist to bring it under control so he can have the surgery.  Review of electronic medical records show that he had a cervical spine x-ray on 09/22/2019 which showed degenerative changes from C3-C7.  X-rays of lumbar spine on 07/21/2018 show significant facet degenerative changes at L5-S1 with significant foraminal narrowing on the right at the L4-5 and on the left at L5-S1.  He takes tramadol and muscle relaxant Robaxin which helps partially only.  He has not had any EMG nerve conduction studies done or lab work for other reversible causes of neuropathy done.  He states his feet are now constantly numb with lack of feeling which has been contributing to his falling.  His sugars have been persistently high and last A1c on 10/01/2019 was yet high at 7.6 but improved from 9.1 from 3 months ago.      ROS:   14 system review of systems is positive for see HPI and all other systems negative  PMH:  Past Medical History:  Diagnosis Date   ADHD (attention deficit hyperactivity disorder)    Diagnosed during teenage years   Alcohol use disorder    On average 14-21 drinks per week   Alcoholic cirrhosis of liver    Per patient - condition is not related to moderate alcohol use but rather prolonged NSAID use and recent abuse.   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[place]   Major depressive disorder    Other spondylosis with radiculopathy, cervical region    Retinopathy    Right eye; per patient report    Type 2 diabetes mellitus with hyperglycemia     Social History:  Social History   Socioeconomic History   Marital status: Single    Spouse name: Not on file   Number of children: Not on file   Years of education: 18   Highest education level: Master's degree (e.g., MA, MS, MEng, MEd, MSW, MBA)  Occupational History   Not on file  Tobacco Use   Smoking status: Former    Types: Cigarettes    Quit date: 06/25/2003    Years since quitting: 18.2   Smokeless tobacco: Never   Tobacco comments:    Currently vapes  Vaping Use   Vaping Use: Every day  Substance and Sexual Activity  Alcohol use: Yes    Alcohol/week: 14.0 - 21.0 standard drinks    Types: 14 - 21 Cans of beer per week    Comment: 2-3 beers per night on average; sometimes up to 6   Drug use: Yes    Types: Marijuana    Comment: 1/week to help sleep; edibles, does not smoke   Sexual activity: Yes  Other Topics Concern   Not on file  Social History Narrative   Not on file   Social Determinants of Health   Financial Resource Strain: High Risk   Difficulty of Paying Living Expenses: Hard  Food Insecurity: No Food Insecurity   Worried About Charity fundraiser in the Last Year: Never true   Ran Out of Food in the Last Year: Never true  Transportation Needs: Unmet Transportation Needs   Lack of Transportation (Medical): Yes   Lack of Transportation (Non-Medical): Yes  Physical Activity: Insufficiently Active   Days of Exercise per Week: 3 days   Minutes of Exercise per Session: 10 min  Stress: Stress Concern Present   Feeling of Stress : Very much  Social Connections: Socially Isolated   Frequency of Communication with Friends and Family: More than three times a week   Frequency of Social Gatherings with Friends and Family: More than three times a week   Attends Religious Services: Never   Marine scientist or Organizations: No   Attends Music therapist: Never   Marital Status: Never married   Human resources officer Violence: Not on file    Medications:   Current Outpatient Medications on File Prior to Visit  Medication Sig Dispense Refill   acetaZOLAMIDE ER (DIAMOX) 500 MG capsule Take 1 capsule (500 mg total) by mouth 2 (two) times daily 60 capsule 2   imatinib (GLEEVEC) 400 MG tablet Take 1 tablet (400 mg total) by mouth daily. 30 tablet 5   insulin glargine (LANTUS SOLOSTAR) 100 UNIT/ML Solostar Pen Inject 10 Units into the skin every morning. 15 mL 3   lisinopril (ZESTRIL) 10 MG tablet Take 1 tablet (10 mg total) by mouth daily. 90 tablet 3   traMADol (ULTRAM) 50 MG tablet TAKE 1 - 2 TABLETS BY MOUTH 3 TIMES DAILY AS NEEDED FOR Olson PAIN. MAY CAUSE DROWSINESS. 60 tablet 1   [DISCONTINUED] DULoxetine (CYMBALTA) 60 MG capsule Take 1 capsule (60 mg total) by mouth 2 (two) times daily. (Patient not taking: No sig reported) 60 capsule 1   No current facility-administered medications on file prior to visit.    Allergies:   No Known Allergies    Today's Vitals   09/11/21 1445  BP: (!) 152/84  Pulse: (!) 102  SpO2: 94%  Weight: 219 lb (99.3 kg)  Height: 6\' 2"  (1.88 m)    Body mass index is 28.12 kg/m.  Physical Exam General: Pleasant middle-aged Caucasian male seated, in no evident distress Head: head normocephalic and atraumatic.   Neck: supple with no carotid or supraclavicular bruits Cardiovascular: regular rate and rhythm, no murmurs Skin:  no rash/petichiae Vascular:  Normal pulses all extremities  Neurologic Exam Mental Status: Awake and fully alert. Oriented to place and time. Recent and remote memory intact during visit. Attention span, concentration and fund of knowledge appropriate during visit. Mood and affect appropriate.   Cranial Nerves: Pupils equal, briskly reactive to light. Extraocular movements full without nystagmus. Visual fields full to confrontation. Hearing intact. Facial sensation intact. Face, tongue, palate moves normally and symmetrically.   Motor: Normal  bulk and tone. Normal strength in all tested extremity muscles.   Sensory.:  Diminished   touch , pinprick sensation in both feet stocking distribution.  Impaired vibration in both feet from ankle down.  Position sense mildly impaired.   Coordination: Rapid alternating movements normal in all extremities. Finger-to-nose and heel-to-shin performed accurately bilaterally. Fine action tremor of both outstretched upper extremities left greater than right - stable.  No pill-rolling or cogwheel rigidity. Gait and Station: Arises from chair with  difficulty. Stance is stooped and favors his back and left leg.  Broad-based antalgic gait without use of AD.  Difficulty performing tandem walking heel toe Reflexes: 1+ and symmetric in upper extremities and absent in both lower extremities. Toes downgoing.       ASSESSMENT/PLAN: 56 year old Caucasian male with lower extremity paresthesias and pain due to worsening diabetic peripheral neuropathic pain.  He also has chronic back pain and left leg sciatica as well as increasing falls and gait difficulties due to degenerative lumbar and cervical spine disease. Prior complaints of cognitive deficits which have been stable.     Peripheral neuropathy, diabetic -EMG/NCV 10/22/2019 diabetic peripheral polyneuropathy -start amitriptyline 25mg  nightly for 1 week then increase to 50mg  nightly.  Discussed potential side effect between tramadol and amitriptyline - advised to hold tramadol for now - only uses 2-3x/week currently. He wishes to proceed with starting amitriptyline.  Discussed potential side effects to monitor for. -No benefit with topiramate, gabapentin or duloxetine -if no benefit with amitriptyline, will have to refer to pain clinic  Spondylolisthesis L5-S1 with lumbar radiculopathy Cervical pain -s/p surgical decompression and arthrodesis L5-S1 by Dr. Ellene Route -Worsening neuropathy pain post procedure and advised to follow-up with  neurosurgery for further treatment options - placed new referral to ensure follow up appt made  Cognitive deficits -Overall stable since prior without worsening -per neurocognitive evaluation concern for cognitive decline compared to prior testing although reported main contributor likely psychiatric distress but also potentially vascular in nature -MRI brain w/wo contrast 05/29/2020 scattered T2/FLAIR punctate hyperintense foci in the subcortical deep white matter likely representing mild chronic microvascular ischemic change in brain volume mildly less than expected for age otherwise unremarkable -Continues to be followed by psychiatry    Follow-up in 6 months or call earlier if needed   CC:  [name], PA-C    I spent 36 minutes of face-to-face and non-face-to-face time with patient.  This included previsit chart review, lab review, study review, order entry, electronic health record documentation, patient education/discussion regarding ongoing neuropathy with paresthesias and further treatment recommendations, cognitive complaints, lumbar procedure and continued cervical pain and answered all other questions to patient satisfaction  Frann Rider, AGNP-BC  [facility] [address] [place], [place] [address]  Phone [phone] Fax [phone] Note: This document was prepared with digital dictation and possible smart phrase technology. Any transcriptional errors that result from this process are unintentional. What is mild for Elevated When She Went to normal already Having normal UTI yearly normal discharge diet adjusted

## 2021-09-12 ENCOUNTER — Other Ambulatory Visit (HOSPITAL_COMMUNITY): Payer: Self-pay

## 2021-09-13 ENCOUNTER — Other Ambulatory Visit: Payer: Self-pay | Admitting: *Deleted

## 2021-09-13 DIAGNOSIS — C9210 Chronic myeloid leukemia, BCR/ABL-positive, not having achieved remission: Secondary | ICD-10-CM

## 2021-09-18 ENCOUNTER — Other Ambulatory Visit: Payer: Self-pay

## 2021-09-20 ENCOUNTER — Ambulatory Visit: Payer: Medicaid Other | Admitting: Endocrinology

## 2021-09-20 ENCOUNTER — Other Ambulatory Visit: Payer: Self-pay

## 2021-09-20 ENCOUNTER — Inpatient Hospital Stay: Payer: Medicaid Other | Attending: Oncology

## 2021-09-20 ENCOUNTER — Encounter: Payer: Self-pay | Admitting: Physician Assistant

## 2021-09-20 ENCOUNTER — Ambulatory Visit: Payer: Medicaid Other | Admitting: Physician Assistant

## 2021-09-20 VITALS — BP 107/84 | HR 92 | Temp 98.5°F | Wt 220.0 lb

## 2021-09-20 DIAGNOSIS — M4317 Spondylolisthesis, lumbosacral region: Secondary | ICD-10-CM | POA: Diagnosis not present

## 2021-09-20 DIAGNOSIS — I10 Essential (primary) hypertension: Secondary | ICD-10-CM

## 2021-09-20 DIAGNOSIS — D732 Chronic congestive splenomegaly: Secondary | ICD-10-CM | POA: Diagnosis not present

## 2021-09-20 DIAGNOSIS — D649 Anemia, unspecified: Secondary | ICD-10-CM | POA: Insufficient documentation

## 2021-09-20 DIAGNOSIS — C9210 Chronic myeloid leukemia, BCR/ABL-positive, not having achieved remission: Secondary | ICD-10-CM | POA: Diagnosis not present

## 2021-09-20 DIAGNOSIS — F3341 Major depressive disorder, recurrent, in partial remission: Secondary | ICD-10-CM

## 2021-09-20 DIAGNOSIS — R5383 Other fatigue: Secondary | ICD-10-CM

## 2021-09-20 LAB — CBC WITH DIFFERENTIAL/PLATELET
Abs Immature Granulocytes: 0.02 10*3/uL (ref 0.00–0.07)
Basophils Absolute: 0.1 10*3/uL (ref 0.0–0.1)
Basophils Relative: 1 %
Eosinophils Absolute: 0.3 10*3/uL (ref 0.0–0.5)
Eosinophils Relative: 4 %
HCT: 37.8 % — ABNORMAL LOW (ref 39.0–52.0)
Hemoglobin: 12.2 g/dL — ABNORMAL LOW (ref 13.0–17.0)
Immature Granulocytes: 0 %
Lymphocytes Relative: 22 %
Lymphs Abs: 1.4 10*3/uL (ref 0.7–4.0)
MCH: 28.3 pg (ref 26.0–34.0)
MCHC: 32.3 g/dL (ref 30.0–36.0)
MCV: 87.7 fL (ref 80.0–100.0)
Monocytes Absolute: 0.6 10*3/uL (ref 0.1–1.0)
Monocytes Relative: 9 %
Neutro Abs: 4.1 10*3/uL (ref 1.7–7.7)
Neutrophils Relative %: 64 %
Platelets: 289 10*3/uL (ref 150–400)
RBC: 4.31 MIL/uL (ref 4.22–5.81)
RDW: 17.8 % — ABNORMAL HIGH (ref 11.5–15.5)
WBC: 6.4 10*3/uL (ref 4.0–10.5)
nRBC: 0 % (ref 0.0–0.2)

## 2021-09-20 LAB — COMPREHENSIVE METABOLIC PANEL
ALT: 30 U/L (ref 0–44)
AST: 36 U/L (ref 15–41)
Albumin: 3.5 g/dL (ref 3.5–5.0)
Alkaline Phosphatase: 91 U/L (ref 38–126)
Anion gap: 5 (ref 5–15)
BUN: 18 mg/dL (ref 6–20)
CO2: 19 mmol/L — ABNORMAL LOW (ref 22–32)
Calcium: 8.3 mg/dL — ABNORMAL LOW (ref 8.9–10.3)
Chloride: 112 mmol/L — ABNORMAL HIGH (ref 98–111)
Creatinine, Ser: 0.81 mg/dL (ref 0.61–1.24)
GFR, Estimated: >60 mL/min (ref >60)
Glucose, Bld: 163 mg/dL — ABNORMAL HIGH (ref 70–99)
Potassium: 3.7 mmol/L (ref 3.5–5.1)
Sodium: 136 mmol/L (ref 135–145)
Total Bilirubin: 0.2 mg/dL — ABNORMAL LOW (ref 0.3–1.2)
Total Protein: 6.2 g/dL — ABNORMAL LOW (ref 6.5–8.1)

## 2021-09-20 LAB — PHOSPHORUS: Phosphorus: 2.6 mg/dL (ref 2.5–4.6)

## 2021-09-20 LAB — MAGNESIUM: Magnesium: 2.3 mg/dL (ref 1.7–2.4)

## 2021-09-20 NOTE — Patient Instructions (Signed)
[facility] --[phone]

## 2021-09-20 NOTE — Assessment & Plan Note (Signed)
Pt taking lisinopril at least a few days per week, and BP is improved on repeat. Initial value was taking while he was speaking.   Continue lisinopril and f/u with nephrologist

## 2021-09-20 NOTE — Assessment & Plan Note (Signed)
>>  ASSESSMENT AND PLAN FOR ESSENTIAL HYPERTENSION, BENIGN WRITTEN ON 09/20/2021  4:49 PM BY DRUBEL, LINDSAY, PA-C  Pt taking lisinopril at least a few days per week, and BP is improved on repeat. Initial value was taking while he was speaking.   Continue lisinopril and f/u with nephrologist

## 2021-09-20 NOTE — Progress Notes (Signed)
Established patient visit   Patient: Gerald Olson   DOB: 1965-08-29   56 y.o. Male  MRN: [id number] Visit Date: 09/20/2021  Today's healthcare provider: [name], PA-C   Cc. HTN f/u  Subjective    HPI  Hypertension, follow-up  BP Readings from Last 3 Encounters:  09/20/21 107/84  09/11/21 (!) 152/84  06/20/21 135/87   Wt Readings from Last 3 Encounters:  09/20/21 220 lb (99.8 kg)  09/11/21 219 lb (99.3 kg)  06/20/21 228 lb 1.6 oz (103.5 kg)     He was last seen for hypertension 3 months ago.  Management since that visit includes stopping lisinopril for now monitor BP at home and follow up in a month.  He reports excellent compliance with treatment. He is not having side effects.  He is following a Regular diet. He does not smoke.  Outside blood pressures are 120's/80's, but when he mentions how he monitors his high blood pressure, he states he mainly feels his pulse. Only takes BP med when he feels he has a high pulse, maybe 2/3 days. At last nephrologist visit was 145/101, advised to stay on lisinopril, has proteinuria secondary to DMII and uncontrolled HTN.  Isn't avoiding NSAIDs, takes advil frequently.   Symptoms:  No chest pain No chest pressure  No palpitations No syncope  No dyspnea No orthopnea  No paroxysmal nocturnal dyspnea No lower extremity edema   Pertinent labs: Lab Results  Component Value Date   CHOL 122 06/14/2020   HDL 34 (L) 06/14/2020   LDLCALC 72 06/14/2020   TRIG 77 06/14/2020   CHOLHDL 3.6 06/14/2020   Lab Results  Component Value Date   NA 136 09/20/2021   K 3.7 09/20/2021   CREATININE 0.81 09/20/2021   GFRNONAA >60 09/20/2021   GLUCOSE 163 (H) 09/20/2021   TSH 3.970 06/27/2020     The ASCVD Risk score (Arnett DK, et al., 2019) failed to calculate for the following reasons:   The valid total cholesterol range is 130 to 320 mg/dL   Chronic pain follow up -Hasn't refilled tramadol since 08/02/21  -states when he has  flares, takes a few days in a row -when pain isn't as bad, will drink etoh, 1-2 beers instead  Fatigue -Extensive discussion on how amitriptyline made him very tried  -feels lower in the winter, moire fatigued, hx of MDD -reports recent 'fungal lung infection', 'cracked a rib', ' coughed up part of his lung', states he treated with distilled vinegar -Curious if his magnesium and vitamin D levels are low  Behavioral health --used to see Dr Nicolasa Ducking, she left, saw another provider and received a big bill so stopped going --has upcoming appt with LCSW, will keep  Medications: Outpatient Medications Prior to Visit  Medication Sig   acetaZOLAMIDE ER (DIAMOX) 500 MG capsule Take 1 capsule (500 mg total) by mouth 2 (two) times daily   imatinib (GLEEVEC) 400 MG tablet Take 1 tablet (400 mg total) by mouth daily.   insulin glargine (LANTUS SOLOSTAR) 100 UNIT/ML Solostar Pen Inject 10 Units into the skin every morning.   traMADol (ULTRAM) 50 MG tablet TAKE 1 - 2 TABLETS BY MOUTH 3 TIMES DAILY AS NEEDED FOR SEVERE PAIN. MAY CAUSE DROWSINESS.   lisinopril (ZESTRIL) 10 MG tablet Take 1 tablet (10 mg total) by mouth daily. (Patient not taking: Reported on 09/20/2021)   [DISCONTINUED] amitriptyline (ELAVIL) 25 MG tablet Take 2 tablets (50 mg total) by mouth at bedtime.  No facility-administered medications prior to visit.    Review of Systems  Constitutional:  Positive for fatigue (worsening fatigue). Negative for activity change, appetite change, chills, diaphoresis, fever and unexpected weight change.  Respiratory: Negative.    Cardiovascular: Negative.   Gastrointestinal: Negative.   Musculoskeletal:  Positive for arthralgias and back pain. Negative for gait problem, joint swelling, myalgias, neck pain and neck stiffness.      Objective    Blood pressure 107/84, pulse 92, temperature 98.5 F (36.9 C), temperature source Oral, weight 220 lb (99.8 kg), SpO2 100 %.    Physical  Exam Constitutional:      General: He is awake.     Appearance: He is well-developed.  HENT:     Head: Normocephalic.  Eyes:     Conjunctiva/sclera: Conjunctivae normal.  Cardiovascular:     Rate and Rhythm: Normal rate and regular rhythm.     Heart sounds: Normal heart sounds.  Pulmonary:     Effort: Pulmonary effort is normal.     Breath sounds: Normal breath sounds.  Skin:    General: Skin is warm.  Neurological:     Mental Status: He is alert and oriented to person, place, and time.  Psychiatric:        Attention and Perception: He is inattentive.        Mood and Affect: Mood and affect normal.        Speech: Speech is tangential.        Behavior: Behavior is hyperactive. Behavior is cooperative.     Comments: Difficult to get a word in with patient. Difficult to redirect conversation.     Results for orders placed or performed in visit on 09/20/21  Magnesium  Result Value Ref Range   Magnesium 2.3 1.7 - 2.4 mg/dL  CBC with Differential  Result Value Ref Range   WBC 6.4 4.0 - 10.5 K/uL   RBC 4.31 4.22 - 5.81 MIL/uL   Hemoglobin 12.2 (L) 13.0 - 17.0 g/dL   HCT 37.8 (L) 39.0 - 52.0 %   MCV 87.7 80.0 - 100.0 fL   MCH 28.3 26.0 - 34.0 pg   MCHC 32.3 30.0 - 36.0 g/dL   RDW 17.8 (H) 11.5 - 15.5 %   Platelets 289 150 - 400 K/uL   nRBC 0.0 0.0 - 0.2 %   Neutrophils Relative % 64 %   Neutro Abs 4.1 1.7 - 7.7 K/uL   Lymphocytes Relative 22 %   Lymphs Abs 1.4 0.7 - 4.0 K/uL   Monocytes Relative 9 %   Monocytes Absolute 0.6 0.1 - 1.0 K/uL   Eosinophils Relative 4 %   Eosinophils Absolute 0.3 0.0 - 0.5 K/uL   Basophils Relative 1 %   Basophils Absolute 0.1 0.0 - 0.1 K/uL   Immature Granulocytes 0 %   Abs Immature Granulocytes 0.02 0.00 - 0.07 K/uL  Comprehensive metabolic panel  Result Value Ref Range   Sodium 136 135 - 145 mmol/L   Potassium 3.7 3.5 - 5.1 mmol/L   Chloride 112 (H) 98 - 111 mmol/L   CO2 19 (L) 22 - 32 mmol/L   Glucose, Bld 163 (H) 70 - 99 mg/dL    BUN 18 6 - 20 mg/dL   Creatinine, Ser 0.81 0.61 - 1.24 mg/dL   Calcium 8.3 (L) 8.9 - 10.3 mg/dL   Total Protein 6.2 (L) 6.5 - 8.1 g/dL   Albumin 3.5 3.5 - 5.0 g/dL   AST 36 15 - 41 U/L  ALT 30 0 - 44 U/L   Alkaline Phosphatase 91 38 - 126 U/L   Total Bilirubin 0.2 (L) 0.3 - 1.2 mg/dL   GFR, Estimated >60 >60 mL/min   Anion gap 5 5 - 15    Assessment & Plan     Problem List Items Addressed This Visit       Cardiovascular and Mediastinum   Essential hypertension, benign - Primary    Pt taking lisinopril at least a few days per week, and BP is improved on repeat. Initial value was taking while he was speaking.   Continue lisinopril and f/u with nephrologist        Musculoskeletal and Integument   Spondylolisthesis at L5-S1 level    Pt is aware he should not take nsaids or excessive tylenol. Aware he needs to avoid etoh.  Ok with occasional tramadol use, still has a refill from 08/02/21, will monitor        Other   Recurrent major depressive disorder, in partial remission ([place])    Pt has appt in Feb to meet with new counselor.  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  Will check thyroid and cmp. Has f/u with int. Med to manage DM on Friday.       Relevant Orders   TSH + free T4   Comprehensive Metabolic Panel (CMET)   CBC     Return in about 4 months (around 01/18/2022).       I,Laura E Walsh,acting as a Education administrator for [employer], PA-C.,have documented all relevant documentation on the behalf of [name], PA-C,as directed by  [name], PA-C while in the presence of [name], PA-C.  I, [name], PA-C have reviewed all documentation for this visit. The documentation on  09/20/2021 for the exam, diagnosis, procedures, and orders are all accurate and complete.    [name], PA-C  [facility] [phone] (phone) [phone] (fax)  [place]

## 2021-09-20 NOTE — Assessment & Plan Note (Signed)
Pt is aware he should not take nsaids or excessive tylenol. Aware he needs to avoid etoh.  Ok with occasional tramadol use, still has a refill from 08/02/21, will monitor

## 2021-09-20 NOTE — Assessment & Plan Note (Signed)
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  Will check thyroid and cmp. Has f/u with int. Med to manage DM on Friday.

## 2021-09-20 NOTE — Assessment & Plan Note (Signed)
Pt has appt in Feb to meet with new counselor.  I do think he needs to see psych again, but can wait to discuss until next f/u

## 2021-09-21 ENCOUNTER — Other Ambulatory Visit: Payer: Medicaid Other

## 2021-09-22 ENCOUNTER — Other Ambulatory Visit: Payer: Self-pay

## 2021-09-22 ENCOUNTER — Ambulatory Visit: Payer: Medicaid Other | Admitting: Endocrinology

## 2021-09-22 VITALS — BP 144/90 | HR 66 | Ht 74.0 in | Wt 221.6 lb

## 2021-09-22 DIAGNOSIS — E1165 Type 2 diabetes mellitus with hyperglycemia: Secondary | ICD-10-CM

## 2021-09-22 LAB — POCT GLYCOSYLATED HEMOGLOBIN (HGB A1C): Hemoglobin A1C: 6 % — AB (ref 4.0–5.6)

## 2021-09-22 MED ORDER — LANTUS SOLOSTAR 100 UNIT/ML ~~LOC~~ SOPN
5.0000 [IU] | PEN_INJECTOR | SUBCUTANEOUS | 3 refills | Status: DC
  Filled 2021-09-22: qty 3, 28d supply, fill #0

## 2021-09-22 NOTE — Progress Notes (Signed)
Subjective:    Patient ID: Gerald Olson, male    DOB: [birth year], 56 y.o.   MRN: [id number]  HPI Pt returns for f/u of diabetes mellitus:  DM type: Insulin-requiring type 2 Dx'ed: 2006 (he lost 70 lbs, so normoglycemia returned until 2019).   Complications: PN, PAD, and DR Therapy: insulin since 2020. DKA: never Severe hypoglycemia: never Pancreatitis: once (2021), on Victoza.   Pancreatic imaging: normal on 2021 CT SDOH: medicaid pays for lantus.  Other: He declines multiple daily injections; he declined to continue [place];  He stopped metformin, due to lightheadedness.   Interval history: He brings his meter with his cbg's which I have reviewed today.  cbg's vary from 72-168.  There is no trend throughout the day.  Main symptom is fatigue.   Past Medical History:  Diagnosis Date   ADHD (attention deficit hyperactivity disorder)    Diagnosed during teenage years   Alcohol use disorder    On average 14-21 drinks per week   Alcoholic cirrhosis of liver    Per patient - condition is not related to moderate alcohol use but rather prolonged NSAID use and recent abuse.   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[place]   Major depressive disorder    Other spondylosis with radiculopathy, cervical region    Retinopathy    Right eye; per patient report   Type 2 diabetes mellitus with hyperglycemia     Past Surgical History:  Procedure Laterality Date   CHOLECYSTECTOMY N/A 06/02/2018   Procedure: LAPAROSCOPIC CHOLECYSTECTOMY;  Surgeon: Vickie Epley, MD;  Location: [facility] ORS;  Service: General;  Laterality: N/A;   KNEE  ARTHROSCOPY     30 Years ago   lumbar five-sacral one posterior lumbar interbody fusion  01/24/2021    Social History   Socioeconomic History   Marital status: Single    Spouse name: Not on file   Number of children: Not on file   Years of education: 18   Highest education level: Master's degree (e.g., MA, MS, MEng, MEd, MSW, MBA)  Occupational History   Not on file  Tobacco Use   Smoking status: Former    Types: Cigarettes    Quit date: 06/25/2003    Years since quitting: 18.2   Smokeless tobacco: Never   Tobacco comments:    Currently vapes  Vaping Use   Vaping Use: Every day  Substance and Sexual Activity   Alcohol use: Yes    Alcohol/week: 14.0 - 21.0 standard drinks    Types: 14 - 21 Cans of beer per week    Comment: 2-3 beers per night on average; sometimes up to 6   Drug use: Yes    Types: Marijuana    Comment: 1/week to help sleep; edibles, does not smoke   Sexual activity: Yes  Other Topics Concern   Not on file  Social History Narrative   Not on file   Social Determinants of Health   Financial Resource Strain: High Risk   Difficulty of Paying Living Expenses: Hard  Food Insecurity: No  Food Insecurity   Worried About Charity fundraiser in the Last Year: Never true   Ran Out of Food in the Last Year: Never true  Transportation Needs: Public librarian (Medical): Yes   Lack of Transportation (Non-Medical): Yes  Physical Activity: Insufficiently Active   Days of Exercise per Week: 3 days   Minutes of Exercise per Session: 10 min  Stress: Stress Concern Present   Feeling of Stress : Very much  Social Connections: Socially Isolated   Frequency of Communication with Friends and Family: More than three times a week   Frequency of Social Gatherings with Friends and Family: More than three times a week   Attends Religious Services: Never   Marine scientist or Organizations: No   Attends Music therapist:  Never   Marital Status: Never married  Human resources officer Violence: Not on file    Current Outpatient Medications on File Prior to Visit  Medication Sig Dispense Refill   acetaZOLAMIDE ER (DIAMOX) 500 MG capsule Take 1 capsule (500 mg total) by mouth 2 (two) times daily 60 capsule 2   imatinib (GLEEVEC) 400 MG tablet Take 1 tablet (400 mg total) by mouth daily. 30 tablet 5   lisinopril (ZESTRIL) 10 MG tablet Take 1 tablet (10 mg total) by mouth daily. 90 tablet 3   traMADol (ULTRAM) 50 MG tablet TAKE 1 - 2 TABLETS BY MOUTH 3 TIMES DAILY AS NEEDED FOR SEVERE PAIN. MAY CAUSE DROWSINESS. 60 tablet 1   [DISCONTINUED] DULoxetine (CYMBALTA) 60 MG capsule Take 1 capsule (60 mg total) by mouth 2 (two) times daily. (Patient not taking: No sig reported) 60 capsule 1   No current facility-administered medications on file prior to visit.    No Known Allergies  Family History  Problem Relation Age of Onset   Aneurysm Father    Drug abuse Sister    Multiple sclerosis Sister    Suicidality Maternal Aunt    Emphysema Maternal Grandfather    Diabetes Neg Hx     BP (!) 144/90    Pulse 66    Ht 6\' 2"  (1.88 m)    Wt 221 lb 9.6 oz (100.5 kg)    SpO2 91%    BMI 28.45 kg/m    Review of Systems     Objective:   Physical Exam    A1c=6.0%    Assessment & Plan:  Insulin-requiring type 2 DM: overcontrolled.  We discussed options.  He chooses to reduce insulin, and not add another med now.    Patient Instructions  check your blood sugar twice a day.  vary the time of day when you check, between before the 3 meals, and at bedtime.  also check if you have symptoms of your blood sugar being too high or too low.  please keep a record of the readings and bring it to your next appointment here (or you can bring the meter itself).  You can write it on any piece of paper.  please call us sooner if your blood sugar goes below 70, or if you have a lot of readings over 200.   I have sent a prescription to your  pharmacy, to reduce the Lantus to 5 units each morning.   Please come back for a follow-up appointment in 3 months.

## 2021-09-22 NOTE — Patient Instructions (Addendum)
check your blood sugar twice a day.  vary the time of day when you check, between before the 3 meals, and at bedtime.  also check if you have symptoms of your blood sugar being too high or too low.  please keep a record of the readings and bring it to your next appointment here (or you can bring the meter itself).  You can write it on any piece of paper.  please call us sooner if your blood sugar goes below 70, or if you have a lot of readings over 200.   I have sent a prescription to your pharmacy, to reduce the Lantus to 5 units each morning.   Please come back for a follow-up appointment in 3 months.

## 2021-09-26 ENCOUNTER — Other Ambulatory Visit: Payer: Self-pay | Admitting: Endocrinology

## 2021-09-26 ENCOUNTER — Other Ambulatory Visit (HOSPITAL_COMMUNITY): Payer: Self-pay

## 2021-09-27 ENCOUNTER — Other Ambulatory Visit: Payer: Self-pay

## 2021-09-27 ENCOUNTER — Other Ambulatory Visit (HOSPITAL_COMMUNITY): Payer: Self-pay

## 2021-09-27 LAB — BCR-ABL1, CML/ALL, PCR, QUANT: Interpretation (BCRAL):: NEGATIVE

## 2021-09-27 MED ORDER — UNIFINE PENTIPS 32G X 4 MM MISC
11 refills | Status: DC
  Filled 2021-09-27: qty 100, 34d supply, fill #0
  Filled 2022-01-17: qty 100, 34d supply, fill #1
  Filled 2022-06-18: qty 100, 34d supply, fill #2

## 2021-09-27 NOTE — Patient Outreach (Signed)
Medicaid Managed Care   Nurse Care Manager Note  09/27/2021 Name:  Gerald Olson MRN:  [id number] DOB:  07-21-1966  Gerald Olson is an 56 y.o. year old male who is a primary patient of Gerald Olson, Gerald Olson, [place].  The [facility] Managed Care Coordination team was consulted for assistance with:    HTN DMII CML Chronic Back Pain  Gerald Olson was given information about Medicaid Managed Care Coordination team services today. Gerald Olson Patient agreed to services and verbal consent obtained.  Engaged with patient by telephone for follow up visit in response to provider referral for case management and/or care coordination services.   Assessments/Interventions:  Review of past medical history, allergies, medications, health status, including review of consultants reports, laboratory and other test data, was performed as part of comprehensive evaluation and provision of chronic care management services.  SDOH (Social Determinants of Health) assessments and interventions performed:   Care Plan  No Known Allergies  Medications Reviewed Today     Reviewed by [name], RN (Case Manager) on 09/27/21 at [place] List Status: <None>   Medication Order Taking? Sig Documenting Provider Last Dose Status Informant  acetaZOLAMIDE ER (DIAMOX) 500 MG capsule [id number] No Take 1 capsule (500 mg total) by mouth 2 (two) times daily  Taking Active   Patient not taking:  Discontinued 08/09/20 1309 (Patient Preference)   imatinib (GLEEVEC) 400 MG tablet [id number] No Take 1 tablet (400 mg total) by mouth daily. [name], RPH-CPP Taking Active Self  insulin glargine (LANTUS SOLOSTAR) 100 UNIT/ML Solostar Pen [id number]  Inject 5 Units into the skin every morning. [name], MD  Active   Insulin Pen Needle (UNIFINE PENTIPS) 32G X 4 MM MISC [id number]  Use to inject Victoza once a day [name], MD  Active   lisinopril (ZESTRIL) 10 MG tablet [id number] No Take 1 tablet (10 mg total) by mouth  daily.  Taking Active   traMADol (ULTRAM) 50 MG tablet [id number] No TAKE 1 - 2 TABLETS BY MOUTH 3 TIMES DAILY AS NEEDED FOR SEVERE PAIN. MAY CAUSE DROWSINESS. 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 08/09/2020   Mesenteric lymphadenopathy 06/14/2020   Splenomegaly- mild  06/14/2020   History of cannabis abuse 06/14/2020   Recurrent major depressive disorder, in partial remission ([place]) 06/09/2020   Anxiety 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 06/09/2020   Alcohol use disorder, moderate, dependence ([place]) 06/09/2020   Frequent falls 05/23/2020   Foraminal stenosis of cervical region 05/09/2020   Diabetic polyneuropathy    ADHD (attention deficit hyperactivity disorder)    Generalized anxiety disorder    Major depressive disorder    Cervical spondylosis 09/22/2019   Essential hypertension, benign 70/35/0093   Alcoholic cirrhosis of liver 06/02/2018   Type 2 diabetes mellitus with hyperglycemia 04/25/2018    Conditions to be addressed/monitored per PCP order:  HTN, DMII, and CML, Chronic Back Pain  Care Plan : RNCM Plan of Care  Updates made by [name], RN  since 09/27/2021 12:00 AM     Problem: Chronic Disease Management and Care Coordination Needs for DM, HTN, CML and Back Pain      Long-Range Goal: Plan of Care for Chronic Disease management and Care Coordination Needs   Start Date: 05/09/2021  Expected End Date: 11/25/2021  Priority: High  Note:   Current Barriers:  Knowledge Deficits related to plan of  care for management of HTN, DMII, and CML and Chronic Back Pain  Care Coordination needs related to Financial constraints related to paying back taxes on house and Limited social support  Chronic Disease Management support and education needs related to HTN, DMII, and CML and Chronic Back Pain Financial Constraints.  Transportation barriers Non-adherence to prescribed medication regimen No Advanced Directives in place  RNCM Clinical Goal(s):  Patient will verbalize understanding of plan for management of HTN, DMII, and CML and Chronic Back Pain verbalize basic understanding of HTN, DMII, and CML and Chronic Back Pain disease process and self health management plan  take all medications exactly as prescribed and will call provider for medication related questions attend all scheduled medical appointments: April Appointments are next appointments demonstrate ongoing adherence to prescribed treatment plan for HTN, DMII, and CML and Chronic Back Pain as evidenced by daily monitoring and recording of CBG, adherence to ADA/ carb modified diet, adherence to prescribed medication regimen, contacting provider for new or worsened symptoms or questions  demonstrate ongoing health management independence  continue to work with RN Care Manager to address care management and care coordination needs related to HTN, DMII, and CML and Chronic Back Pain  work with pharmacist to address complex medication regimen  related to HTN, DMII, and CML and Chronic Back Pain work with Education officer, museum to address financial constraints related to overdue payment of back taxes and potential risk of losing home, Limited social support and counseling for anxiety/stress related to the management of HTN, DMII, and CML and Chronic Back Pain. collaborate with the care management team towards completion of advanced directives  through collaboration with RN Care manager, provider, and care team.   Interventions: Inter-disciplinary care  team collaboration (see longitudinal plan of care) Evaluation of current treatment plan related to  self management and patient's adherence to plan as established by provider   Diabetes:  (Status: Goal on Track (progressing): YES.)  Lab Results  Component Value Date   HGBA1C 6.0 (A) 09/22/2021    Assessed patient's understanding of A1c goal: <7% Continue to provided education to patient about basic DM disease process; Reviewed medications with patient and discussed importance of medication adherence.  On 09/22/2021 Lantus insulin decreased to 5 units every AM. Counseled on importance of regular laboratory monitoring as prescribed;        Discussed plans with patient for ongoing care management follow up and provided patient with direct contact information for care management team;      Reviewed scheduled/upcoming provider appointments including: April appointments are next appointments       Patient reports blood sugars are doing well and mainly in the 120 range. Patient reports no hypoglycemic or hyperglycemic episodes.       Call provider for findings outside established parameters;       Referral made to pharmacy team previously for assistance with complex medication regimen.  [facility] pharmacist continue to make telephone contact with patient.        Review of patient status, including review of consultants reports, relevant laboratory and other test results, and medications completed.  Reviewed the importance of drinking low sugar drinks and use of sugar substitutes.         Chronic Myeloid Leukemia (CML)  (Status: Goal on Track (progressing): YES.) Evaluation of current treatment plan related to  CML and patient's adherence to plan as established by provider. Patient reports increased fatigue for last 2 months.  Patient states at times he "has not energy to do things".  Discussed the importance of listening to the body and resting when necessary and not pushing through things.  Awaiting recent  lab work results to determine if abnormal labs are causing increased fatigue Discussed plans with patient for ongoing care management follow up and provided patient with direct contact information for care management team Reviewed medications with patient and discussed the importance of taking all medications as prescribed; Pharmacy referral for complex medication regimen.  Awaiting rescheduled appointment with [facility] pharmacist.  Hypertension: (Status: Goal on Track (progressing): YES.) Last practice recorded BP readings:   BP Readings from Last 3 Encounters:  09/22/21 (!) 144/90  09/20/21 107/84  09/11/21 (!) 152/84    Most recent eGFR/CrCl: No results found for: EGFR  No components found for: CRCL  Evaluation of current treatment plan related to hypertension self management and patient's adherence to plan as established by provider;   Reviewed medications with patient and discussed importance of compliance;  Counseled on the importance of exercise goals with target of 150 minutes per week but understand how patient's increased fatigue impacts the amount of exercise he can do.  Discussed plans with patient for ongoing care management follow up and provided patient with direct contact information for care management team; Discussed patient's recent blood pressure readings at recent medical office visits.  Reviewed scheduled/upcoming provider appointments including: Next appointments are in April 2023 Reviewed to importance of low sodium diet.  Patient uses Pink Salt if he ever uses salt with foods.  Patient verbalized understanding.  Pain:  (Status: Goal on Track (progressing): YES.) Pain assessment performed.  Patient reports chronic back pain with right side greater than left side.  He rates pain on right side of back '5 - 6' with max '8'. He rates pain on left side of back '2' with max '3 - 4'. Pain rating is on a Pain Scale of 0 - 10. Using pain medication with fair relief.  Medications  reviewed.  Reviewed provider established plan for pain management; Discussed importance of adherence to all scheduled medical appointments;  Patient Goals/Self-Care Activities: Patient will self administer medications as prescribed as evidenced by self report/primary caregiver report  Patient will attend all scheduled provider appointments as evidenced by clinician review of documented attendance to scheduled appointments and patient/caregiver report Patient will call pharmacy for medication refills as evidenced by patient report and review of pharmacy fill history as appropriate Patient will continue to perform ADL's independently as evidenced by patient/caregiver report Patient will continue to perform IADL's independently as evidenced by patient/caregiver report Patient will call provider office for new concerns or questions as evidenced by review of documented incoming telephone call notes and patient report Patient will work with BSW to address care coordination needs and will continue to work with the clinical team to address health care and disease management related needs as evidenced by documented adherence to scheduled care management/care coordination appointments - check blood sugar at prescribed times: twice daily - enter blood sugar readings and medication or insulin into daily log - take the blood sugar log to all doctor visits - check  blood pressure weekly - write blood pressure results in a log or diary - keep a blood pressure log - take blood pressure log to all doctor appointments - call doctor for signs and symptoms of high blood pressure - keep all doctor appointments - take medications for blood pressure exactly as prescribed       Follow Up:  Patient agrees to Care Plan and Follow-up.  Plan: The Managed Medicaid care management team will reach out to the patient again over the next 30 days.  Date/time of next scheduled RN care management/care coordination  outreach:  10/25/2021 at 3:00 PM  [place], [place] [facility] Mobile: [phone]

## 2021-09-27 NOTE — Patient Instructions (Signed)
Visit Information  Gerald Olson was given information about Medicaid Managed Care team care coordination services as a part of their Healthy [facility] Medicaid benefit. Gerald Olson verbally consented to engagement with the [facility] Managed Care team.   If you are experiencing a medical emergency, please call 911 or report to your local emergency department or urgent care.   If you have a non-emergency medical problem during routine business hours, please contact your provider's office and ask to speak with a nurse.   For questions related to your Healthy [facility] health plan, please call: [phone] or visit the homepage here: [email or web address]  If you would like to schedule transportation through your Healthy [facility] plan, please call the following number at least 2 days in advance of your appointment: [phone]  Call the [place] at [phone], at any time, 24 hours a day, 7 days a week. If you are in danger or need immediate medical attention call 911.  If you would like help to quit smoking, call 1-800-QUIT-NOW 463-064-2148) OR Espaol: 1-855-Djelo-Ya (5-883-254-9826) o para ms informacin haga clic aqu or Text READY to 200-400 to register via text  Gerald Olson - following are the goals we discussed in your visit today: Please see Patient Goals in the [place] of Care below also.  Goals Addressed             This Visit's Progress    RNCM - Chronic Back Pain Management       Timeframe:  Long-Range Goal Priority:  High Start Date:   05/09/2021                          Expected End Date: 11/25/2021         Patient Goals: Patient will self administer medications as prescribed Patient will attend all scheduled provider appointments Patient will call pharmacy for medication refills Patient will continue to perform ADL's independently Patient will continue to perform IADL's independently Patient will  call provider office for new concerns or questions Patient will work with BSW to address care coordination needs and will continue to work with the clinical team to address health care and disease management related needs.                      RNCM - CML Disease Progression Minimized or Managed       Timeframe:  Long-Range Goal Priority:  High Start Date:     05/09/2021                       Expected End Date:  11/25/2021     Patient Goals: Patient will self administer medications as prescribed Patient will attend all scheduled provider appointments Patient will call pharmacy for medication refills Patient will continue to perform ADL's independently Patient will continue to perform IADL's independently Patient will call provider office for new concerns or questions Patient will work with BSW to address care coordination needs and will continue to work with the clinical team to address health care and disease management related needs.                              RNCM - Glycemic Management & Monitored       Timeframe:  Long-Range Goal Priority:  High Start Date:     05/09/2021  Expected End Date:  11/25/2021  Patient Goals: Patient will self administer medications as prescribed Patient will attend all scheduled provider appointments Patient will call pharmacy for medication refills Patient will continue to perform ADL's independently Patient will continue to perform IADL's independently Patient will call provider office for new concerns or questions Patient will work with BSW to address care coordination needs and will continue to work with the clinical team to address health care and disease management related needs.                 RNCM - Hypertension Monitored and Managed       Timeframe:  Long-Range Goal Priority:  High Start Date:  05/09/2021                           Expected End Date:  4/1//2023  Patient Goals Patient will self administer medications as  prescribed Patient will attend all scheduled provider appointments Patient will call pharmacy for medication refills Patient will continue to perform ADL's independently Patient will continue to perform IADL's independently Patient will call provider office for new concerns or questions Patient will work with BSW to address care coordination needs and will continue to work with the clinical team to address health care and disease management related needs.                      Please see education materials related to today's visit provided by MyChart link.  The patient verbalized understanding of today's visit information and Instructions and will review it via My Chart link.  The Managed Medicaid care management team will reach out to the patient again over the next 30 days.   [name] RN, BSN Community Care Coordinator [facility] Mobile: [phone]   Following is a copy of your plan of care:  Care Plan : [facility] Plan of Care  Updates made by [name], RN since 09/27/2021 12:00 AM     Problem: Chronic Disease Management and Care Coordination Needs for DM, HTN, CML and Back Pain      Long-Range Goal: Plan of Care for Chronic Disease management and Care Coordination Needs   Start Date: 05/09/2021  Expected End Date: 11/25/2021  Priority: High  Note:   Current Barriers:  Knowledge Deficits related to plan of care for management of HTN, DMII, and CML and Chronic Back Pain  Care Coordination needs related to Financial constraints related to paying back taxes on house and Limited social support  Chronic Disease Management support and education needs related to HTN, DMII, and CML and Chronic Back Pain Financial Constraints.  Transportation barriers Non-adherence to prescribed medication regimen No Advanced Directives in place  RNCM Clinical Goal(s):  Patient will verbalize understanding of plan for management of HTN, DMII, and CML and  Chronic Back Pain verbalize basic understanding of HTN, DMII, and CML and Chronic Back Pain disease process and self health management plan  take all medications exactly as prescribed and will call provider for medication related questions attend all scheduled medical appointments: April Appointments are next appointments demonstrate ongoing adherence to prescribed treatment plan for HTN, DMII, and CML and Chronic Back Pain as evidenced by daily monitoring and recording of CBG, adherence to ADA/ carb modified diet, adherence to prescribed medication regimen, contacting provider for new or worsened symptoms or questions  demonstrate ongoing health management independence  continue to work with RN Care  Manager to address care management and care coordination needs related to HTN, DMII, and CML and Chronic Back Pain  work with pharmacist to address complex medication regimen  related to HTN, DMII, and CML and Chronic Back Pain work with Education officer, museum to address financial constraints related to overdue payment of back taxes and potential risk of losing home, Limited social support and counseling for anxiety/stress related to the management of HTN, DMII, and CML and Chronic Back Pain. collaborate with the care management team towards completion of advanced directives  through collaboration with RN Care manager, provider, and care team.   Interventions: Inter-disciplinary care team collaboration (see longitudinal plan of care) Evaluation of current treatment plan related to  self management and patient's adherence to plan as established by provider   Diabetes:  (Status: Goal on Track (progressing): YES.)  Lab Results  Component Value Date   HGBA1C 6.0 (A) 09/22/2021    Assessed patient's understanding of A1c goal: <7% Continue to provided education to patient about basic DM disease process; Reviewed medications with patient and discussed importance of medication adherence.  On 09/22/2021 Lantus  insulin decreased to 5 units every AM. Counseled on importance of regular laboratory monitoring as prescribed;        Discussed plans with patient for ongoing care management follow up and provided patient with direct contact information for care management team;      Reviewed scheduled/upcoming provider appointments including: April appointments are next appointments       Patient reports blood sugars are doing well and mainly in the 120 range. Patient reports no hypoglycemic or hyperglycemic episodes.       Call provider for findings outside established parameters;       Referral made to pharmacy team previously for assistance with complex medication regimen.  [facility] pharmacist continue to make telephone contact with patient.        Review of patient status, including review of consultants reports, relevant laboratory and other test results, and medications completed.  Reviewed the importance of drinking low sugar drinks and use of sugar substitutes.         Chronic Myeloid Leukemia (CML)  (Status: Goal on Track (progressing): YES.) Evaluation of current treatment plan related to  CML and patient's adherence to plan as established by provider. Patient reports increased fatigue for last 2 months.  Patient states at times he "has not energy to do things".  Discussed the importance of listening to the body and resting when necessary and not pushing through things.  Awaiting recent lab work results to determine if abnormal labs are causing increased fatigue Discussed plans with patient for ongoing care management follow up and provided patient with direct contact information for care management team Reviewed medications with patient and discussed the importance of taking all medications as prescribed; Pharmacy referral for complex medication regimen.  Awaiting rescheduled appointment with [facility] pharmacist.  Hypertension: (Status: Goal on Track (progressing): YES.) Last practice recorded BP readings:   BP  Readings from Last 3 Encounters:  09/22/21 (!) 144/90  09/20/21 107/84  09/11/21 (!) 152/84    Most recent eGFR/CrCl: No results found for: EGFR  No components found for: CRCL  Evaluation of current treatment plan related to hypertension self management and patient's adherence to plan as established by provider;   Reviewed medications with patient and discussed importance of compliance;  Counseled on the importance of exercise goals with target of 150 minutes per week but understand how patient's increased fatigue impacts the amount  of exercise he can do.  Discussed plans with patient for ongoing care management follow up and provided patient with direct contact information for care management team; Discussed patient's recent blood pressure readings at recent medical office visits.  Reviewed scheduled/upcoming provider appointments including: Next appointments are in April 2023 Reviewed to importance of low sodium diet.  Patient uses Pink Salt if he ever uses salt with foods.  Patient verbalized understanding.  Pain:  (Status: Goal on Track (progressing): YES.) Pain assessment performed.  Patient reports chronic back pain with right side greater than left side.  He rates pain on right side of back '5 - 6' with max '8'. He rates pain on left side of back '2' with max '3 - 4'. Pain rating is on a Pain Scale of 0 - 10. Using pain medication with fair relief.  Medications reviewed.  Reviewed provider established plan for pain management; Discussed importance of adherence to all scheduled medical appointments;  Patient Goals/Self-Care Activities: Patient will self administer medications as prescribed as evidenced by self report/primary caregiver report  Patient will attend all scheduled provider appointments as evidenced by clinician review of documented attendance to scheduled appointments and patient/caregiver report Patient will call pharmacy for medication refills as evidenced by patient  report and review of pharmacy fill history as appropriate Patient will continue to perform ADL's independently as evidenced by patient/caregiver report Patient will continue to perform IADL's independently as evidenced by patient/caregiver report Patient will call provider office for new concerns or questions as evidenced by review of documented incoming telephone call notes and patient report Patient will work with BSW to address care coordination needs and will continue to work with the clinical team to address health care and disease management related needs as evidenced by documented adherence to scheduled care management/care coordination appointments - check blood sugar at prescribed times: twice daily - enter blood sugar readings and medication or insulin into daily log - take the blood sugar log to all doctor visits - check blood pressure weekly - write blood pressure results in a log or diary - keep a blood pressure log - take blood pressure log to all doctor appointments - call doctor for signs and symptoms of high blood pressure - keep all doctor appointments - take medications for blood pressure exactly as prescribed

## 2021-09-28 ENCOUNTER — Other Ambulatory Visit: Payer: Self-pay

## 2021-09-28 ENCOUNTER — Inpatient Hospital Stay: Payer: Medicaid Other | Attending: Oncology | Admitting: Pharmacist

## 2021-09-28 ENCOUNTER — Other Ambulatory Visit (HOSPITAL_COMMUNITY): Payer: Self-pay

## 2021-09-28 DIAGNOSIS — C9210 Chronic myeloid leukemia, BCR/ABL-positive, not having achieved remission: Secondary | ICD-10-CM

## 2021-09-28 MED ORDER — IMATINIB MESYLATE 400 MG PO TABS
400.0000 mg | ORAL_TABLET | Freq: Every day | ORAL | 5 refills | Status: DC
  Filled 2021-09-28 – 2021-10-09 (×2): qty 30, 30d supply, fill #0
  Filled 2021-11-09: qty 30, 30d supply, fill #1
  Filled 2021-12-21: qty 30, 30d supply, fill #2
  Filled 2022-02-13: qty 30, 30d supply, fill #3
  Filled 2022-03-26: qty 30, 30d supply, fill #4
  Filled 2022-05-07: qty 30, 30d supply, fill #5

## 2021-09-28 NOTE — Progress Notes (Signed)
[facility]  Telephone:([phone] [phone]  Patient Care Team: [name], PA-C as PCP - General (Physician Assistant) [name], RN as Case Manager Ethelda Chick as Social Worker Alyce Pagan as [place] Management (Licensed Clinical Social Worker) Hughes Better, [place] (Pharmacist)   Name of the patient: Gerald Olson  [id number]  12-Aug-1966   Date of visit: 09/28/21  Virtual visit: I connected with  Gerald Olson on 09/28/21 at  1:30 PM EST by a video enabled telemedicine application and verified that I am speaking with the correct person using two identifiers. I discussed the limitations of evaluation and management by telemedicine and the availability of in person appointments. The patient expressed understanding and agreed to proceed. Locations: Patient: home , Provider: in office  HPI: Patient is a 56 y.o. male with CML. Currently treated with Gleevec (imatinib), started on 07/28/20.  Reason for Consult: Oral chemotherapy follow-up for Gleevec (imatinib) therapy.   PAST MEDICAL HISTORY: Past Medical History:  Diagnosis Date   ADHD (attention deficit hyperactivity disorder)    Diagnosed during teenage years   Alcohol use disorder    On average 14-21 drinks per week   Alcoholic cirrhosis of liver    Per patient - condition is not related to moderate alcohol use but rather prolonged NSAID use and recent abuse.   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    ALLERGIES:  has No Known Allergies.  MEDICATIONS:  Current Outpatient Medications  Medication Sig Dispense Refill   acetaZOLAMIDE ER (DIAMOX) 500 MG capsule Take 1 capsule (500 mg total) by mouth 2 (two) times daily 60 capsule 2   imatinib (GLEEVEC) 400 MG tablet Take 1 tablet (400 mg total) by mouth daily. 30 tablet 5   insulin glargine (LANTUS SOLOSTAR) 100 UNIT/ML Solostar Pen Inject 5 Units into the skin every morning. 15 mL 3   Insulin Pen Needle (UNIFINE PENTIPS) 32G X 4 MM MISC Use to inject Victoza once a day 100 each 11   lisinopril (ZESTRIL) 10 MG tablet Take 1 tablet (10 mg total) by mouth daily. 90 tablet 3   traMADol (ULTRAM) 50 MG tablet TAKE 1 - 2 TABLETS BY MOUTH 3 TIMES DAILY AS NEEDED FOR SEVERE PAIN. MAY CAUSE DROWSINESS. 60 tablet 1   No current facility-administered medications for this visit.    VITAL SIGNS: There were no vitals taken for this visit. There were no vitals filed for this visit.  Estimated body mass index is 28.45 kg/m as calculated from the following:   Height as of 09/22/21: 6\' 2"  (1.88 m).   Weight as of 09/22/21: 100.5 kg (221 lb 9.6 oz).  LABS: CBC:    Component Value Date/Time  WBC 6.4 09/20/2021 1624   HGB 12.2 (L) 09/20/2021 1624   HGB 14.1 09/14/2020 1554   HCT 37.8 (L) 09/20/2021 1624   HCT 42.3 09/14/2020 1554   PLT 289 09/20/2021 1624   PLT 263 09/14/2020 1554   MCV 87.7 09/20/2021 1624   MCV 88 09/14/2020 1554   NEUTROABS 4.1 09/20/2021 1624   NEUTROABS 3.9 09/14/2020 1554   LYMPHSABS 1.4 09/20/2021 1624   LYMPHSABS 1.7 09/14/2020 1554   MONOABS 0.6 09/20/2021 1624   EOSABS 0.3 09/20/2021 1624   EOSABS 0.1 09/14/2020 1554   BASOSABS 0.1 09/20/2021 1624   BASOSABS 0.0 09/14/2020 1554   Comprehensive  Metabolic Panel:    Component Value Date/Time   NA 136 09/20/2021 1624   NA 139 09/14/2020 1554   K 3.7 09/20/2021 1624   CL 112 (H) 09/20/2021 1624   CO2 19 (L) 09/20/2021 1624   BUN 18 09/20/2021 1624   BUN 9 09/14/2020 1554   CREATININE 0.81 09/20/2021 1624   CREATININE 0.80 01/12/2019 1452   GLUCOSE 163 (H) 09/20/2021 1624   CALCIUM 8.3 (L) 09/20/2021 1624   AST 36 09/20/2021 1624   ALT 30 09/20/2021 1624   ALKPHOS 91 09/20/2021 1624   BILITOT 0.2 (L) 09/20/2021 1624   BILITOT 0.6 09/14/2020 1554   PROT 6.2 (L) 09/20/2021 1624   PROT 6.8 09/14/2020 1554   ALBUMIN 3.5 09/20/2021 1624   ALBUMIN 4.2 09/14/2020 1554    Present during today's visit: patient only  Assessment and Plan-  BCR-ABL labs, CMP/CBC reviewed with patient, disease continues to be controlled.  Continue imatinib 400mg  daily.   Oral Chemotherapy Side Effect/Intolerance:  Diarrhea: about 5 times per month, he is able to manage the diarrhea and connects it mostly to his diet Edema:  more in left foot, only some days, he uses compression stockings to manage Fatigue: multiple factorial, lack of sleep, imatinib, muscles fatigued post surgery. He plans to begin to increase activity to hopefully increase his stamina. No reported nausea  Oral Chemotherapy Adherence: Still reports missing a few doses per month  New medications: none reported  Medication Access Issues: no issues, he fills at [facility]  Patient expressed understanding and was in agreement with this plan. He also understands that He can call clinic at any time with any questions, concerns, or complaints.   Follow-up plan: Patient will f/u in 3 months with MD  Thank you for allowing me to participate in the care of this very pleasant patient.   Time Total: 25 mins  Visit consisted of counseling and education on dealing with issues of symptom management in the setting of serious and potentially life-threatening illness.Greater  than 50%  of this time was spent counseling and coordinating care related to the above assessment and plan.  Signed by: [name], PharmD, BCPS, Salley Slaughter, CPP Hematology/Oncology Clinical Pharmacist Practitioner [place]/DB/AP [facility] [phone]  09/28/2021 1:50 PM

## 2021-10-03 ENCOUNTER — Other Ambulatory Visit (HOSPITAL_COMMUNITY): Payer: Self-pay

## 2021-10-06 ENCOUNTER — Other Ambulatory Visit (HOSPITAL_COMMUNITY): Payer: Self-pay

## 2021-10-06 ENCOUNTER — Ambulatory Visit (HOSPITAL_COMMUNITY): Payer: Medicaid Other | Admitting: Licensed Clinical Social Worker

## 2021-10-09 ENCOUNTER — Other Ambulatory Visit (HOSPITAL_COMMUNITY): Payer: Self-pay

## 2021-10-12 DIAGNOSIS — M4317 Spondylolisthesis, lumbosacral region: Secondary | ICD-10-CM | POA: Diagnosis not present

## 2021-10-12 DIAGNOSIS — I10 Essential (primary) hypertension: Secondary | ICD-10-CM | POA: Diagnosis not present

## 2021-10-12 DIAGNOSIS — Z6826 Body mass index (BMI) 26.0-26.9, adult: Secondary | ICD-10-CM | POA: Diagnosis not present

## 2021-10-16 ENCOUNTER — Other Ambulatory Visit (HOSPITAL_COMMUNITY): Payer: Self-pay

## 2021-10-17 ENCOUNTER — Other Ambulatory Visit: Payer: Self-pay

## 2021-10-17 ENCOUNTER — Ambulatory Visit (INDEPENDENT_AMBULATORY_CARE_PROVIDER_SITE_OTHER): Payer: Medicaid Other | Admitting: Licensed Clinical Social Worker

## 2021-10-17 DIAGNOSIS — F32A Depression, unspecified: Secondary | ICD-10-CM | POA: Diagnosis not present

## 2021-10-17 NOTE — Progress Notes (Signed)
Comprehensive Clinical Assessment (CCA) Note  10/17/2021 Gerald Olson [id number]  Chief Complaint:  Chief Complaint  Patient presents with   Depression   Anxiety   Visit Diagnosis:  depression unspecified    Client is a 56 year old male. Client is referred by PCP for a  depression.   Client states mental health symptoms as evidenced by:   Depression Difficulty Concentrating; Fatigue; Sleep (too much or little); Weight gain/loss; Irritability Difficulty Concentrating; Fatigue; Sleep (too much or little); Weight gain/loss; Irritability  Duration of Depressive Symptoms Greater than two weeks Greater than two weeks  Mania Increased Energy; Racing thoughts; Overconfidence Increased Energy; Racing thoughts; Overconfidence  Anxiety Tension; Worrying; Restlessness; Fatigue Tension; Worrying; Restlessness; Fatigue  Psychosis None None  Trauma None None  Obsessions None None  Compulsions None None  Inattention Avoids/dislikes activities that require focus; Disorganized; Forgetful; Does not follow instructions (not oppositional); Does not seem to listen; Fails to pay attention/makes careless mistakes Avoids/dislikes activities that require focus; Disorganized; Forgetful; Does not follow instructions (not oppositional); Does not seem to listen; Fails to pay attention/makes careless mistakes  Hyperactivity/Impulsivity Always on the go; Blurts out answers; Difficulty waiting turn Always on the go; Blurts out answers; Difficulty waiting turn  Oppositional/Defiant Behaviors None None  Emotional Irregularity Chronic feelings of emptiness Chronic feelings of emptiness     Client denies suicidal and homicidal ideations at this time  Client denies hallucinations and delusions at this time  Client was screened for the following SDOH: Depression  Assessment Information that integrates subjective and objective details with a therapist's professional interpretation:     Patient was alert and  oriented x3.  Patient was pleasant, cooperative, maintained good eye contact.  He engaged well in therapy session and was dressed casually.  He presented today with tangential thought process and anxious mood\affect.  Comes in today as a referral from primary care physician.  Patient reports that he has a history of depression only.  Patient states that for the past year he has been suffering from severe depression due to chronic illness.  Patient reports that he is currently on disability for SSDI for lower lumbar degenerative disc disease.  Patient states that he suffers from irritability, isolation, lack of motivation, and memory loss.  Patient was tangential with thought process with euphoric thoughts.  This is as evidenced by redirection from his masters degree in psychology back to his mental health symptoms.  Patient reports that he "has gotten 3 perfect scores in his masters program and has been offered jobs at [employer] and [employer].  Patient reports that he graduated from [employer] with a bachelors and masters degree in psychology.  LCSW notes that patient was hard to redirect due to patient's rapid thought process.  LCSW does note that patient reports that he has had 3 monster energy drinks since 8 AM this morning.  Patient was interested in cognitive behavioral therapy.  LCSW explained that cover [facility] therapy could only be conducted 1 time monthly.  LCSW recommended for patient to be referred out due to having insurance through [place].  Patient was agreeable to referral being sent to [facility].   Client meets criteria for: Depression unspecified type Client states use of the following substances: None reported     Client was in agreement with treatment recommendations.   CCA Screening, Triage and Referral (STR)  Patient Reported Information  Referral name: PCP    Whom do you see for routine medical  problems?  Primary Care  Practice/Facility Name: [facility]  How Long Has This Been Causing You Problems? > than 6 months  What Do You Feel Would Help You the Most Today? Treatment for Depression or other mood problem   Have You Recently Been in Any Inpatient Treatment (Hospital/Detox/Crisis Center/28-Day Program)? No  Have You Ever Received Services From [employer] Before? No  Have You Recently Had Any Thoughts About Hurting Yourself? No  Are You Planning to Commit Suicide/Harm Yourself At This time? No   Have you Recently Had Thoughts About [place]? No   Have You Used Any Alcohol or Drugs in the Past 24 Hours? No   Do You Currently Have a Therapist/Psychiatrist? No   Have You Been Recently Discharged From Any Office Practice or Programs? No      CCA Screening Triage Referral Assessment Type of Contact: Face-to-Face    Is CPS involved or ever been involved? Never  Is APS involved or ever been involved? Never   Patient Determined To Be At Risk for Harm To Self or Others Based on Review of Patient Reported Information or Presenting Complaint? No    Location of Assessment: [facility] of Residence: [place]      CCA Biopsychosocial Intake/Chief Complaint:  depression  Current Symptoms/Problems: tangential thoughts, not sleeping enough been up for 48 hours but pt reports he has drank 3 monsters, poor memory.   Patient Reported Schizophrenia/Schizoaffective Diagnosis in Past: No   Strengths: willing to engage in treatment.  Preferences: therapy only  Abilities: "gunsmoth, Writer in the Water quality scientist, and muscian"   Type of Services Patient Feels are Needed: therapy   Initial Clinical Notes/Concerns: lack of sleep   Mental Health Symptoms Depression:   Difficulty Concentrating; Fatigue; Sleep (too much or little); Weight gain/loss; Irritability   Duration of Depressive  symptoms:  Greater than two weeks   Mania:   Increased Energy; Racing thoughts; Overconfidence   Anxiety:    Tension; Worrying; Restlessness; Fatigue   Psychosis:   None   Duration of Psychotic symptoms: No data recorded  Trauma:   None   Obsessions:   None   Compulsions:   None   Inattention:   Avoids/dislikes activities that require focus; Disorganized; Forgetful; Does not follow instructions (not oppositional); Does not seem to listen; Fails to pay attention/makes careless mistakes   Hyperactivity/Impulsivity:   Always on the go; Blurts out answers; Difficulty waiting turn   Oppositional/Defiant Behaviors:   None   Emotional Irregularity:   Chronic feelings of emptiness   Other Mood/Personality Symptoms:  No data recorded   Mental Status Exam Appearance and self-care  Stature:   Average   Weight:   Average weight   Clothing:   Casual   Grooming:   Normal   Cosmetic use:   None   Posture/gait:   Normal   Motor activity:   Not Remarkable   Sensorium  Attention:   Normal   Concentration:   Normal   Orientation:   X5   Recall/memory:   Normal   Affect and Mood  Affect:   Anxious; Depressed   Mood:   Anxious; Hypomania   Relating  Eye contact:   Normal   Facial expression:   Anxious   Attitude toward examiner:   Cooperative   Thought and Language  Speech flow:  Flight of Ideas   Thought content:   Appropriate to Mood and Circumstances   Preoccupation:  No data recorded  Hallucinations:   None   Organization:  No data recorded  [employer] of Knowledge:   Fair   Intelligence:   Average   Abstraction:   Functional   Judgement:   Fair   Reality Testing:   Realistic   Insight:   Flashes of insight   Decision Making:   Normal   Social Functioning  Social Maturity:   Isolates; Impulsive   Social Judgement:  No data recorded  Stress  Stressors:   Illness; Financial; Work   Coping  Ability:   Programme researcher, broadcasting/film/video Deficits:   Interpersonal; Self-care; Self-control   Supports:   Family; Friends/Service system     Religion: Religion/Spirituality Are You A Religious Person?: No  Leisure/Recreation: Leisure / Recreation Do You Have Hobbies?: No  Exercise/Diet: Exercise/Diet Do You Exercise?: No Have You Gained or Lost A Significant Amount of Weight in the Past Six Months?: No Do You Follow a Special Diet?: No Do You Have Any Trouble Sleeping?: Yes Explanation of Sleeping Difficulties: not sleeping patient has been up for the last 48 hours.   CCA Employment/Education Employment/Work Situation: Employment / Work Technical sales engineer: On disability Why is Patient on Disability: Low lumbar,  DDD, How Long has Patient Been on Disability: 1 year Patient's Job has Been Impacted by Current Illness: No Has Patient ever Been in the [employer]?: No  Education: Education Is Patient Currently Attending School?: No Last Grade Completed: 12 Did Teacher, adult education From [employer]?: Yes Did You Attend College?: Yes What Type of College Degree Do you Have?: [employer]: Art therapist in Psychology Did Heritage manager?: No What Was Your Major?: Psychology Did You Have An Individualized Education Program (IIEP): No Did You Have Any Difficulty At [employer]?: No Patient's Education Has Been Impacted by Current Illness: No   CCA Family/Childhood History Family and Relationship History: Family history Marital status: Single Are you sexually active?: No What is your sexual orientation?: hetrosexual Does patient have children?: No  Childhood History:  Childhood History By whom was/is the patient raised?: Both parents Additional childhood history information: Both parents and than they seprate when pt was 56 years old Does patient have siblings?: Yes Number of Siblings: 1 Description of patient's current relationship with siblings:  strained relationship. Did patient suffer any verbal/emotional/physical/sexual abuse as a child?: No Did patient suffer from severe childhood neglect?: No Has patient ever been sexually abused/assaulted/raped as an adolescent or adult?: No Was the patient ever a victim of a crime or a disaster?: No Witnessed domestic violence?: No Has patient been affected by domestic violence as an adult?: No  Child/Adolescent Assessment:     CCA Substance Use Alcohol/Drug Use: Alcohol / Drug Use History of alcohol / drug use?: Yes Substance #1 Name of Substance 1: Alchohol 1 - Amount (size/oz): 2 to 3 beers 1 - Frequency: daily 1 - Last Use / Amount: last night. 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 08/09/2020   Mesenteric lymphadenopathy 06/14/2020   Splenomegaly- mild  06/14/2020   History of cannabis abuse 06/14/2020   Recurrent major depressive disorder, in  partial remission ([place]) 06/09/2020   Anxiety 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 06/09/2020   Alcohol use disorder, moderate, dependence ([place]) 06/09/2020   Frequent falls 05/23/2020   Foraminal stenosis of cervical region 05/09/2020   Diabetic polyneuropathy    ADHD (attention deficit hyperactivity disorder)    Generalized anxiety disorder    Major depressive disorder    Cervical spondylosis 09/22/2019   Essential hypertension, benign 64/15/8309   Alcoholic cirrhosis of liver 06/02/2018   Type 2 diabetes mellitus with hyperglycemia 04/25/2018     Collaboration of Care: Other Referral to [facility] for CBT therapy   Patient/Guardian was advised Release of  Information must be obtained prior to any record release in order to collaborate their care with an outside provider. Patient/Guardian was advised if they have not already done so to contact the registration department to sign all necessary forms in order for [place] to release information regarding their care.   Consent: Patient/Guardian gives verbal consent for treatment and assignment of benefits for services provided during this visit. Patient/Guardian expressed understanding and agreed to proceed.   Dory Horn, LCSW

## 2021-10-25 ENCOUNTER — Other Ambulatory Visit: Payer: Self-pay | Admitting: Physician Assistant

## 2021-10-25 ENCOUNTER — Other Ambulatory Visit: Payer: Self-pay

## 2021-10-25 DIAGNOSIS — K0889 Other specified disorders of teeth and supporting structures: Secondary | ICD-10-CM

## 2021-10-25 NOTE — Patient Outreach (Signed)
Medicaid Managed Care   Nurse Care Manager Note  10/25/2021 Name:  Gerald Olson MRN:  [id number] DOB:  Dec 14, 1965  Gerald Olson is an 56 y.o. year old male who is a primary patient of Thedore Mins, Ria Comment, [place].  The [facility] Managed Care Coordination team was consulted for assistance with:    HTN DMII CML Chronic Back Pain  [name] was given information about Medicaid Managed Care Coordination team services today. Gerald Olson Patient agreed to services and verbal consent obtained.  Engaged with patient by telephone for follow up visit in response to provider referral for case management and/or care coordination services.   Assessments/Interventions:  Review of past medical history, allergies, medications, health status, including review of consultants reports, laboratory and other test data, was performed as part of comprehensive evaluation and provision of chronic care management services.  SDOH (Social Determinants of Health) assessments and interventions performed:   Care Plan  No Known Allergies  Medications Reviewed Today     Reviewed by [name], RN (Case Manager) on 10/25/21 at [place] List Status: <None>   Medication Order Taking? Sig Documenting Provider Last Dose Status Informant  acetaZOLAMIDE ER (DIAMOX) 500 MG capsule [id number] No Take 1 capsule (500 mg total) by mouth 2 (two) times daily  Taking Active   Patient not taking:  Discontinued 08/09/20 1309 (Patient Preference)   imatinib (GLEEVEC) 400 MG tablet [id number]  Take 1 tablet (400 mg total) by mouth daily. [name], RPH-CPP  Active   insulin glargine (LANTUS SOLOSTAR) 100 UNIT/ML Solostar Pen [id number]  Inject 5 Units into the skin every morning. [name], MD  Active   Insulin Pen Needle (UNIFINE PENTIPS) 32G X 4 MM MISC [id number]  Use to inject Victoza once a day [name], MD  Active   lisinopril (ZESTRIL) 10 MG tablet [id number] No Take 1 tablet (10 mg total) by mouth daily.   Taking Active   traMADol (ULTRAM) 50 MG tablet [id number] No TAKE 1 - 2 TABLETS BY MOUTH 3 TIMES DAILY AS NEEDED FOR SEVERE PAIN. MAY CAUSE DROWSINESS. 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 08/09/2020   Mesenteric lymphadenopathy 06/14/2020   Splenomegaly- mild  06/14/2020   History of cannabis abuse 06/14/2020   Recurrent major depressive disorder, in partial remission ([place]) 06/09/2020   Anxiety 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 06/09/2020   Alcohol use disorder, moderate, dependence ([place]) 06/09/2020   Frequent falls 05/23/2020   Foraminal stenosis of cervical region 05/09/2020   Diabetic polyneuropathy    ADHD (attention deficit hyperactivity disorder)    Generalized anxiety disorder    Major depressive disorder    Cervical spondylosis 09/22/2019   Essential hypertension, benign 32/07/2481   Alcoholic cirrhosis of liver 06/02/2018   Type 2 diabetes mellitus with hyperglycemia 04/25/2018    Conditions to be addressed/monitored per PCP order:  HTN, DMII, and CML and Chronic Back Pain  Care Plan : RNCM Plan of Care  Updates made by [name],  RN since 10/25/2021 12:00 AM     Problem: Chronic Disease Management and Care Coordination Needs for DM, HTN, CML and Back Pain      Long-Range Goal: Plan of Care for Chronic Disease management and Care Coordination Needs   Start Date: 05/09/2021  Expected End Date: 12/25/2021  Priority: High  Note:   Current Barriers:  Knowledge Deficits related to plan of care  for management of HTN, DMII, and CML and Chronic Back Pain  Care Coordination needs related to Financial constraints related to paying back taxes on house and Limited social support  Chronic Disease Management support and education needs related to HTN, DMII, and CML and Chronic Back Pain Financial Constraints.  Transportation barriers Non-adherence to prescribed medication regimen No Advanced Directives in place  RNCM Clinical Goal(s):  Patient will verbalize understanding of plan for management of HTN, DMII, and CML and Chronic Back Pain verbalize basic understanding of HTN, DMII, and CML and Chronic Back Pain disease process and self health management plan  take all medications exactly as prescribed and will call provider for medication related questions attend all scheduled medical appointments: April Appointments are next appointments demonstrate ongoing adherence to prescribed treatment plan for HTN, DMII, and CML and Chronic Back Pain as evidenced by daily monitoring and recording of CBG, adherence to ADA/ carb modified diet, adherence to prescribed medication regimen, contacting provider for new or worsened symptoms or questions  demonstrate ongoing health management independence  continue to work with RN Care Manager to address care management and care coordination needs related to HTN, DMII, and CML and Chronic Back Pain  work with pharmacist to address complex medication regimen  related to HTN, DMII, and CML and Chronic Back Pain work with Education officer, museum to address financial constraints related to overdue payment of back taxes and potential risk of losing home, Limited social support and counseling for anxiety/stress related to the management of HTN, DMII, and CML and Chronic Back Pain. collaborate with the care management team towards completion of advanced directives  through collaboration with RN Care manager, provider, and care team.   Interventions: Inter-disciplinary care team  collaboration (see longitudinal plan of care) Evaluation of current treatment plan related to  self management and patient's adherence to plan as established by provider   Diabetes:  (Status: Goal on Track (progressing): YES.)  Lab Results  Component Value Date   HGBA1C 6.0 (A) 09/22/2021    Assessed patient's understanding of A1c goal: <7% Continue to provided education to patient about basic DM disease process; Reviewed medications with patient and discussed importance of medication adherence. Patient compliant with medications. Counseled on importance of regular laboratory monitoring as prescribed;        Discussed plans with patient for ongoing care management follow up and provided patient with direct contact information for care management team;      Reviewed scheduled/upcoming provider appointments including: April appointments are next appointments       Patient reports blood sugars are slightly elevated into the 140 range.  Patient's highest reading was 171 following missing one dose of insulin and eating pizza.  Patient reports he has a tooth issue that is causing him pain and also to eat on the opposite side to chew.  Discussed how this tooth issue needs to be addressed soon in order to help prevent the tooth from becoming infected. Discussed how infection can increase blood sugar level.  Patient will continue to monitor blood sugar readings. Patient reports no hypoglycemic or hyperglycemic  episodes.       Call provider for findings outside established parameters;       Referral made to pharmacy team previously for assistance with complex medication regimen.  Plan to follow up with [facility] pharmacist regarding contacting  patient.        Review of patient status, including review of consultants reports, relevant laboratory and other test results, and medications completed.  Patient understands the importance of drinking low sugar drinks and use of sugar substitutes.         Chronic  Myeloid Leukemia (CML)  (Status: Goal on Track (progressing): YES.) Evaluation of current treatment plan related to  CML and patient's adherence to plan as established by provider. Patient reports continued increased fatigue.  Discussed the importance of listening to the body and resting when necessary and not pushing through things.  Discussed plans with patient for ongoing care management follow up and provided patient with direct contact information for care management team Reviewed medications with patient and discussed the importance of taking all medications as prescribed; Pharmacy referral for complex medication regimen.  Awaiting rescheduled appointment with [facility] pharmacist - the RN Care Manager will follow up   Hypertension: (Status: Goal on Track (progressing): YES.) Last practice recorded BP readings:   BP Readings from Last 3 Encounters:  09/22/21 (!) 144/90  09/20/21 107/84  09/11/21 (!) 152/84     Most recent eGFR/CrCl: No results found for: EGFR  No components found for: CRCL  Evaluation of current treatment plan related to hypertension self management and patient's adherence to plan as established by provider;   Reviewed medications with patient and discussed importance of compliance.  Discussed plans with patient for ongoing care management follow up and provided patient with direct contact information for care management team; Discussed patient's recent blood pressure readings at recent medical office visits.  Reviewed scheduled/upcoming provider appointments including: Next appointments are in April 2023  Pain:  (Status: Goal on Track (progressing): YES.) Pain assessment performed.  Patient reports chronic back pain. He rates back pain '5' currently. Pain rating is on a Pain Scale of 0 - 10. Using pain medication PRN with fair relief.  Medications reviewed. Patient is in need of refill of Tramadol mainly for tooth pain that wakes patient while asleep. Reviewed provider  established plan for pain management; Discussed importance of adherence to all scheduled medical appointments;  Patient Goals/Self-Care Activities: Patient will self administer medications as prescribed as evidenced by self report/primary caregiver report  Patient will attend all scheduled provider appointments as evidenced by clinician review of documented attendance to scheduled appointments and patient/caregiver report Patient will call pharmacy for medication refills as evidenced by patient report and review of pharmacy fill history as appropriate Patient will continue to perform ADL's independently as evidenced by patient/caregiver report Patient will continue to perform IADL's independently as evidenced by patient/caregiver report Patient will call provider office for new concerns or questions as evidenced by review of documented incoming telephone call notes and patient report Patient will work with BSW to address care coordination needs and will continue to work with the clinical team to address health care and disease management related needs as evidenced by documented adherence to scheduled care management/care coordination appointments - check blood sugar at prescribed times: twice daily - enter blood sugar readings and medication or insulin into daily log - take the blood sugar log to all doctor visits - check blood pressure weekly - write blood pressure results in a log or diary -  keep a blood pressure log - take blood pressure log to all doctor appointments - call doctor for signs and symptoms of high blood pressure - keep all doctor appointments - take medications for blood pressure exactly as prescribed       Follow Up:  Patient agrees to Care Plan and Follow-up.  Plan: The Managed Medicaid care management team will reach out to the patient again over the next 30 days.  Date/time of next scheduled RN care management/care coordination outreach:  11/22/2021 at 3:00  pm  [name] RN, [facility] Mobile: [phone]

## 2021-10-25 NOTE — Progress Notes (Signed)
CCM call [place] endorsed tooth pain ?Fine to see me in office but may be better served by a dentist, referral placed ?

## 2021-10-25 NOTE — Patient Instructions (Signed)
Visit Information  Gerald Olson was given information about Medicaid Managed Care team care coordination services as a part of their Healthy [facility] Medicaid benefit. Gerald Olson verbally consented to engagement with the [facility] Managed Care team.   If you are experiencing a medical emergency, please call 911 or report to your local emergency department or urgent care.   If you have a non-emergency medical problem during routine business hours, please contact your provider's office and ask to speak with a nurse.   For questions related to your Healthy [facility] health plan, please call: [phone] or visit the homepage here: [email or web address]  If you would like to schedule transportation through your Healthy [facility] plan, please call the following number at least 2 days in advance of your appointment: [phone]  For information about your ride after you set it up, call Ride Assist at [phone]. Use this number to activate a Will Call pickup, or if your transportation is late for a scheduled pickup. Use this number, too, if you need to make a change or cancel a previously scheduled reservation.  If you need transportation services right away, call [phone]. The after-hours call center is staffed 24 hours to handle ride assistance and urgent reservation requests (including discharges) 365 days a year. Urgent trips include sick visits, hospital discharge requests and life-sustaining treatment.  Call the [place] at [phone], at any time, 24 hours a day, 7 days a week. If you are in danger or need immediate medical attention call 911.  If you would like help to quit smoking, call 1-800-QUIT-NOW 713-802-6375) OR Espaol: 1-855-Djelo-Ya (9-509-326-7124) o para ms informacin haga clic aqu or Text READY to 200-400 to register via text  Gerald Olson - following are the goals we discussed in your visit today:   Goals  Addressed             This Visit's Progress    RNCM - Chronic Back Pain Management       Timeframe:  Long-Range Goal Priority:  High Start Date:   05/09/2021                          Expected End Date: 12/25/2021         Patient Goals: Patient will self administer medications as prescribed Patient will attend all scheduled provider appointments Patient will call pharmacy for medication refills Patient will continue to perform ADL's independently Patient will continue to perform IADL's independently Patient will call provider office for new concerns or questions Patient will work with BSW to address care coordination needs and will continue to work with the clinical team to address health care and disease management related needs.                      RNCM - CML Disease Progression Minimized or Managed       Timeframe:  Long-Range Goal Priority:  High Start Date:     05/09/2021                       Expected End Date:  12/25/2021     Patient Goals: Patient will self administer medications as prescribed Patient will attend all scheduled provider appointments Patient will call pharmacy for medication refills Patient will continue to perform ADL's independently Patient will continue to perform IADL's independently Patient will call provider office for new concerns or questions Patient will work with BSW to  address care coordination needs and will continue to work with the clinical team to address health care and disease management related needs.                              RNCM - Glycemic Management & Monitored       Timeframe:  Long-Range Goal Priority:  High Start Date:     05/09/2021                        Expected End Date:  12/25/2021  Patient Goals: Patient will self administer medications as prescribed Patient will attend all scheduled provider appointments Patient will call pharmacy for medication refills Patient will continue to perform ADL's independently Patient will  continue to perform IADL's independently Patient will call provider office for new concerns or questions Patient will work with BSW to address care coordination needs and will continue to work with the clinical team to address health care and disease management related needs.                 RNCM - Hypertension Monitored and Managed       Timeframe:  Long-Range Goal Priority:  High Start Date:  05/09/2021                           Expected End Date:  5/1//2023  Patient Goals Patient will self administer medications as prescribed Patient will attend all scheduled provider appointments Patient will call pharmacy for medication refills Patient will continue to perform ADL's independently Patient will continue to perform IADL's independently Patient will call provider office for new concerns or questions Patient will work with BSW to address care coordination needs and will continue to work with the clinical team to address health care and disease management related needs.                      Please see education materials related to today's visit provided by MyChart link.  Patient verbalizes understanding of instructions and care plan provided today and agrees to view in [place]. Active MyChart status confirmed with patient.    The Managed Medicaid care management team will reach out to the patient again over the next 30 days.   [name] RN, BSN Community Care Coordinator [facility] Mobile: [phone]   Following is a copy of your plan of care:  Care Plan : [facility] Plan of Care  Updates made by [name], RN since 10/25/2021 12:00 AM     Problem: Chronic Disease Management and Care Coordination Needs for DM, HTN, CML and Back Pain      Long-Range Goal: Plan of Care for Chronic Disease management and Care Coordination Needs   Start Date: 05/09/2021  Expected End Date: 12/25/2021  Priority: High  Note:   Current Barriers:  Knowledge  Deficits related to plan of care for management of HTN, DMII, and CML and Chronic Back Pain  Care Coordination needs related to Financial constraints related to paying back taxes on house and Limited social support  Chronic Disease Management support and education needs related to HTN, DMII, and CML and Chronic Back Pain Financial Constraints.  Transportation barriers Non-adherence to prescribed medication regimen No Advanced Directives in place  RNCM Clinical Goal(s):  Patient will verbalize understanding of plan for management of HTN, DMII, and CML  and Chronic Back Pain verbalize basic understanding of HTN, DMII, and CML and Chronic Back Pain disease process and self health management plan  take all medications exactly as prescribed and will call provider for medication related questions attend all scheduled medical appointments: April Appointments are next appointments demonstrate ongoing adherence to prescribed treatment plan for HTN, DMII, and CML and Chronic Back Pain as evidenced by daily monitoring and recording of CBG, adherence to ADA/ carb modified diet, adherence to prescribed medication regimen, contacting provider for new or worsened symptoms or questions  demonstrate ongoing health management independence  continue to work with RN Care Manager to address care management and care coordination needs related to HTN, DMII, and CML and Chronic Back Pain  work with pharmacist to address complex medication regimen  related to HTN, DMII, and CML and Chronic Back Pain work with Education officer, museum to address financial constraints related to overdue payment of back taxes and potential risk of losing home, Limited social support and counseling for anxiety/stress related to the management of HTN, DMII, and CML and Chronic Back Pain. collaborate with the care management team towards completion of advanced directives  through collaboration with RN Care manager, provider, and care team.    Interventions: Inter-disciplinary care team collaboration (see longitudinal plan of care) Evaluation of current treatment plan related to  self management and patient's adherence to plan as established by provider   Diabetes:  (Status: Goal on Track (progressing): YES.)  Lab Results  Component Value Date   HGBA1C 6.0 (A) 09/22/2021    Assessed patient's understanding of A1c goal: <7% Continue to provided education to patient about basic DM disease process; Reviewed medications with patient and discussed importance of medication adherence. Patient compliant with medications. Counseled on importance of regular laboratory monitoring as prescribed;        Discussed plans with patient for ongoing care management follow up and provided patient with direct contact information for care management team;      Reviewed scheduled/upcoming provider appointments including: April appointments are next appointments       Patient reports blood sugars are slightly elevated into the 140 range.  Patient's highest reading was 171 following missing one dose of insulin and eating pizza.  Patient reports he has a tooth issue that is causing him pain and also to eat on the opposite side to chew.  Discussed how this tooth issue needs to be addressed soon in order to help prevent the tooth from becoming infected. Discussed how infection can increase blood sugar level.  Patient will continue to monitor blood sugar readings. Patient reports no hypoglycemic or hyperglycemic episodes.       Call provider for findings outside established parameters;       Referral made to pharmacy team previously for assistance with complex medication regimen.  Plan to follow up with [facility] pharmacist regarding contacting  patient.        Review of patient status, including review of consultants reports, relevant laboratory and other test results, and medications completed.  Patient understands the importance of drinking low sugar drinks and  use of sugar substitutes.         Chronic Myeloid Leukemia (CML)  (Status: Goal on Track (progressing): YES.) Evaluation of current treatment plan related to  CML and patient's adherence to plan as established by provider. Patient reports continued increased fatigue.  Discussed the importance of listening to the body and resting when necessary and not pushing through things.  Discussed plans with patient for  ongoing care management follow up and provided patient with direct contact information for care management team Reviewed medications with patient and discussed the importance of taking all medications as prescribed; Pharmacy referral for complex medication regimen.  Awaiting rescheduled appointment with [facility] pharmacist - the RN Care Manager will follow up   Hypertension: (Status: Goal on Track (progressing): YES.) Last practice recorded BP readings:   BP Readings from Last 3 Encounters:  09/22/21 (!) 144/90  09/20/21 107/84  09/11/21 (!) 152/84     Most recent eGFR/CrCl: No results found for: EGFR  No components found for: CRCL  Evaluation of current treatment plan related to hypertension self management and patient's adherence to plan as established by provider;   Reviewed medications with patient and discussed importance of compliance.  Discussed plans with patient for ongoing care management follow up and provided patient with direct contact information for care management team; Discussed patient's recent blood pressure readings at recent medical office visits.  Reviewed scheduled/upcoming provider appointments including: Next appointments are in April 2023  Pain:  (Status: Goal on Track (progressing): YES.) Pain assessment performed.  Patient reports chronic back pain. He rates back pain '5' currently. Pain rating is on a Pain Scale of 0 - 10. Using pain medication PRN with fair relief.  Medications reviewed. Patient is in need of refill of Tramadol mainly for tooth pain that wakes  patient while asleep. Reviewed provider established plan for pain management; Discussed importance of adherence to all scheduled medical appointments;  Patient Goals/Self-Care Activities: Patient will self administer medications as prescribed as evidenced by self report/primary caregiver report  Patient will attend all scheduled provider appointments as evidenced by clinician review of documented attendance to scheduled appointments and patient/caregiver report Patient will call pharmacy for medication refills as evidenced by patient report and review of pharmacy fill history as appropriate Patient will continue to perform ADL's independently as evidenced by patient/caregiver report Patient will continue to perform IADL's independently as evidenced by patient/caregiver report Patient will call provider office for new concerns or questions as evidenced by review of documented incoming telephone call notes and patient report Patient will work with BSW to address care coordination needs and will continue to work with the clinical team to address health care and disease management related needs as evidenced by documented adherence to scheduled care management/care coordination appointments - check blood sugar at prescribed times: twice daily - enter blood sugar readings and medication or insulin into daily log - take the blood sugar log to all doctor visits - check blood pressure weekly - write blood pressure results in a log or diary - keep a blood pressure log - take blood pressure log to all doctor appointments - call doctor for signs and symptoms of high blood pressure - keep all doctor appointments - take medications for blood pressure exactly as prescribed

## 2021-10-27 ENCOUNTER — Other Ambulatory Visit (HOSPITAL_COMMUNITY): Payer: Self-pay

## 2021-10-30 ENCOUNTER — Other Ambulatory Visit (HOSPITAL_COMMUNITY): Payer: Self-pay

## 2021-10-30 ENCOUNTER — Other Ambulatory Visit: Payer: Self-pay

## 2021-10-30 NOTE — Patient Instructions (Signed)
Visit Information ? ?[name] was given information about Medicaid Managed Care team care coordination services as a part of their Healthy [facility] Medicaid benefit. Gerald Olson verbally consented to engagement with the [facility] Managed Care team.  ? ?If you are experiencing a medical emergency, please call 911 or report to your local emergency department or urgent care.  ? ?If you have a non-emergency medical problem during routine business hours, please contact your provider's office and ask to speak with a nurse.  ? ?For questions related to your Healthy [facility] health plan, please call: [phone] or visit the homepage here: [email or web address] ? ?If you would like to schedule transportation through your Healthy [facility] plan, please call the following number at least 2 days in advance of your appointment: [phone] ? For information about your ride after you set it up, call Ride Assist at [phone]. Use this number to activate a Will Call pickup, or if your transportation is late for a scheduled pickup. Use this number, too, if you need to make a change or cancel a previously scheduled reservation. ? If you need transportation services right away, call [phone]. The after-hours call center is staffed 24 hours to handle ride assistance and urgent reservation requests (including discharges) 365 days a year. Urgent trips include sick visits, hospital discharge requests and life-sustaining treatment. ? ?Call the [place] at [phone], at any time, 24 hours a day, 7 days a week. If you are in danger or need immediate medical attention call 911. ? ?If you would like help to quit smoking, call 1-800-QUIT-NOW (801)104-5023) OR Espa?ol: 1-855-D?jelo-Ya (720) 164-7277) o para m?s informaci?n haga clic aqu? or Text READY to 200-400 to register via text ? ?[name] - following are the goals we discussed in your visit today:  ? Goals  Addressed   ?None ?  ? ? ? ?Social Worker will follow up with patient in 30 days.  ? ?Gerald Olson, BSW, MHA ?[place]  ?High Risk Managed Medicaid Team  ?[phone]  ? ?Following is a copy of your plan of care:  ?There are no care plans that you recently modified to display for this patient. ? ?  ?

## 2021-10-30 NOTE — Patient Outreach (Signed)
?Medicaid Managed Care ?Social Work Note ? ?10/30/2021 ?Name:  Gerald Olson MRN:  [id number] DOB:  [birth year] ? ?Gerald Olson is an 56 y.o. year old male who is a primary patient of Gerald Olson, Gerald Olson, [place].  The [facility] Managed Care Coordination team was consulted for assistance with:   dental resources ? ?Gerald Olson was given information about Medicaid Managed Care Coordination team services today. Gerald Olson Patient agreed to services and verbal consent obtained. ? ?Engaged with patient  for by telephone forfollow up visit in response to referral for case management and/or care coordination services.  ? ?Assessments/Interventions:  Review of past medical history, allergies, medications, health status, including review of consultants reports, laboratory and other test data, was performed as part of comprehensive evaluation and provision of chronic care management services. ? ?SDOH: (Social Determinant of Health) assessments and interventions performed: ?BSW completed follow up telephone call with patient. He stated he still cannot locate his Tax paperwork. Patient stated he needs dental resources to get his tooth pulled. BSW verified email address and sent patient dental resources in [place] and [place]. No other resources needed at this time.  ? ?Advanced Directives Status:  Not addressed in this encounter. ? ?Care Plan ?                ?No Known Allergies ? ?Medications Reviewed Today   ? ? Reviewed by Gerald Rise, RN (Case Manager) on 10/25/21 at [place] List Status: <None>  ? ?Medication Order Taking? Sig Documenting Provider Last Dose Status Informant  ?acetaZOLAMIDE ER (DIAMOX) 500 MG capsule [id number] No Take 1 capsule (500 mg total) by mouth 2 (two) times daily  Taking Active   ?Patient not taking:  Discontinued 08/09/20 1309 (Patient Preference)   ?imatinib (GLEEVEC) 400 MG tablet [id number]  Take 1 tablet (400 mg total) by mouth daily. Gerald Olson, RPH-CPP  Active   ?insulin glargine  (LANTUS SOLOSTAR) 100 UNIT/ML Solostar Pen [id number]  Inject 5 Units into the skin every morning. Gerald Olson  Active   ?Insulin Pen Needle (UNIFINE PENTIPS) 32G X 4 MM MISC [id number]  Use to inject Victoza once a day Gerald Olson  Active   ?lisinopril (ZESTRIL) 10 MG tablet [id number] No Take 1 tablet (10 mg total) by mouth daily.  Taking Active   ?traMADol (ULTRAM) 50 MG tablet [id number] No TAKE 1 - 2 TABLETS BY MOUTH 3 TIMES DAILY AS NEEDED FOR SEVERE PAIN. MAY CAUSE DROWSINESS. Gerald Sons, Olson Taking Active   ?Med List Note Gerald Olson, RPH-CPP 07/28/20 1202): Gleevec (imatinib) filled at [facility]  ? ?  ?  ? ?  ? ? ?Patient Active Problem List  ? Diagnosis Date Noted  ? Other fatigue 09/20/2021  ? Spondylolisthesis at L5-S1 level 01/24/2021  ? Cervical radicular pain 10/26/2020  ? Lumbar facet arthropathy 10/26/2020  ? Lumbar degenerative disc disease 10/26/2020  ? Opiate misuse 10/26/2020  ? 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 Mesenteric lymphadenopathy 06/14/2020  ? Splenomegaly- mild  06/14/2020  ? History of cannabis abuse 06/14/2020  ? Recurrent major depressive disorder, in partial remission (Gerald Olson) 06/09/2020  ? Anxiety 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 06/09/2020  ? Alcohol use disorder, moderate, dependence (Gerald Olson) 06/09/2020  ? Frequent falls 05/23/2020  ? Foraminal stenosis of cervical region 05/09/2020  ? Diabetic polyneuropathy   ? ADHD (attention deficit hyperactivity disorder)   ? Generalized anxiety disorder   ?  Major depressive disorder   ? Cervical spondylosis 09/22/2019  ? Essential hypertension, benign 01/28/2019  ? Alcoholic cirrhosis of liver 06/02/2018  ? Type 2 diabetes mellitus with hyperglycemia 04/25/2018  ? ? ?Conditions to be addressed/monitored per PCP order:   dental  ? ?There are no care plans that you recently modified to display for this patient. ? ? ?Follow up:  Patient agrees to Care Plan and  Follow-up. ? ?Plan: The Managed Medicaid care management team will reach out to the patient again over the next 30 days. ? ?Date/time of next scheduled Social Work care management/care coordination outreach:  4/6/3 ? ?Gerald Olson, BSW, [place] ?[place]  ?High Risk Managed Medicaid Team  ?[phone]  ?

## 2021-11-01 ENCOUNTER — Other Ambulatory Visit (HOSPITAL_COMMUNITY): Payer: Self-pay

## 2021-11-07 ENCOUNTER — Other Ambulatory Visit (HOSPITAL_COMMUNITY): Payer: Self-pay

## 2021-11-09 ENCOUNTER — Other Ambulatory Visit (HOSPITAL_COMMUNITY): Payer: Self-pay

## 2021-11-20 ENCOUNTER — Other Ambulatory Visit (HOSPITAL_COMMUNITY): Payer: Self-pay

## 2021-11-20 MED ORDER — CHLORHEXIDINE GLUCONATE 0.12 % MT SOLN
OROMUCOSAL | 0 refills | Status: DC
  Filled 2021-11-20: qty 473, 14d supply, fill #0

## 2021-11-20 MED ORDER — AMOXICILLIN 500 MG PO CAPS
ORAL_CAPSULE | ORAL | 0 refills | Status: DC
  Filled 2021-11-20: qty 21, 7d supply, fill #0

## 2021-11-20 MED ORDER — IBUPROFEN 800 MG PO TABS
800.0000 mg | ORAL_TABLET | Freq: Three times a day (TID) | ORAL | 0 refills | Status: DC | PRN
  Filled 2021-11-20: qty 20, 7d supply, fill #0

## 2021-11-21 IMAGING — CT CT BIOPSY BONE MARROW
1 of 2 series · 10 of 14 positions shown, 13 images · non-contrast
Comparison: none

INDICATION: Lymphadenopathy. Concern for CML. Please perform CT-guided bone
marrow biopsy for tissue diagnostic purposes.

[Series 2: i-spiral 5.0 b30f · axial · 0.76mm/px · z∈[+856,+961]mm · 10 of 38 slices shown, 13 images]
[im 4/38  soft-tissue]
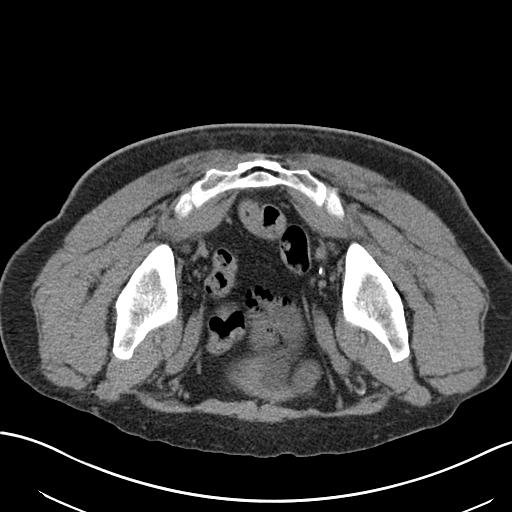
[im 4/38  bone]
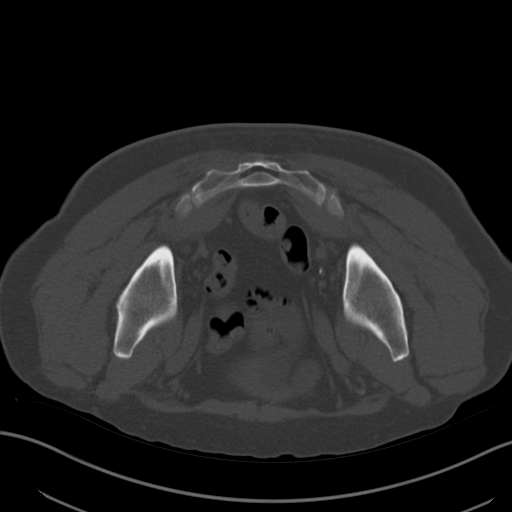
[im 7/38  bone]
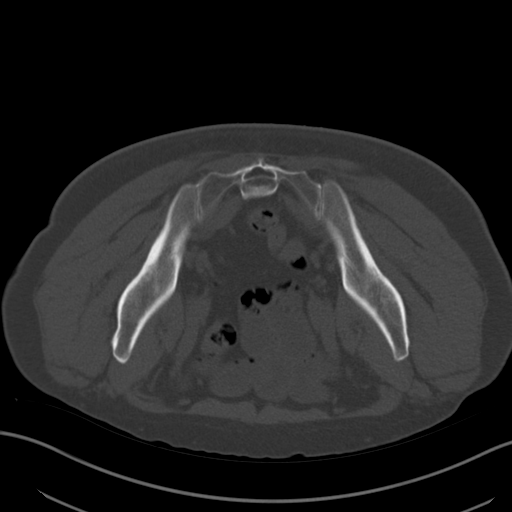
[im 11/38  bone]
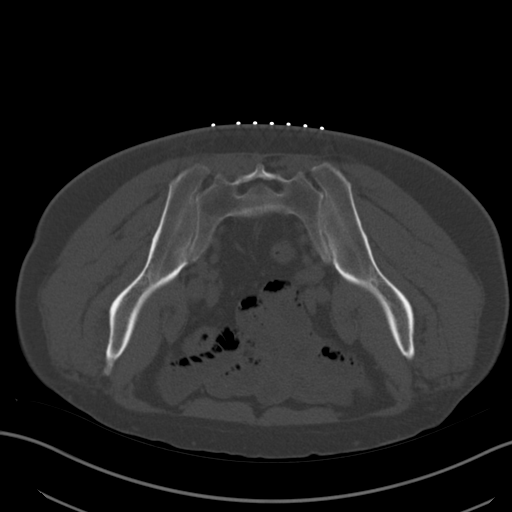
[im 14/38  bone]
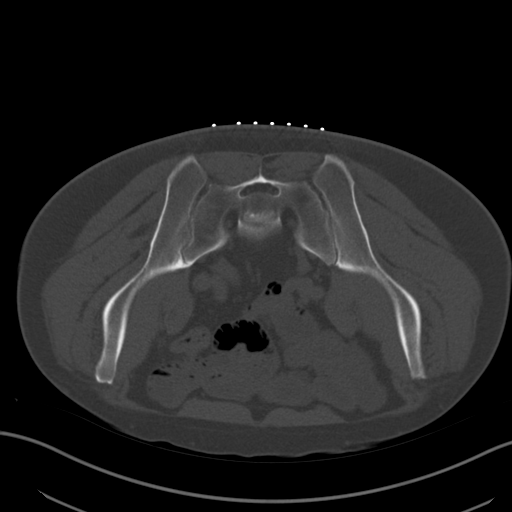
[im 17/38  soft-tissue]
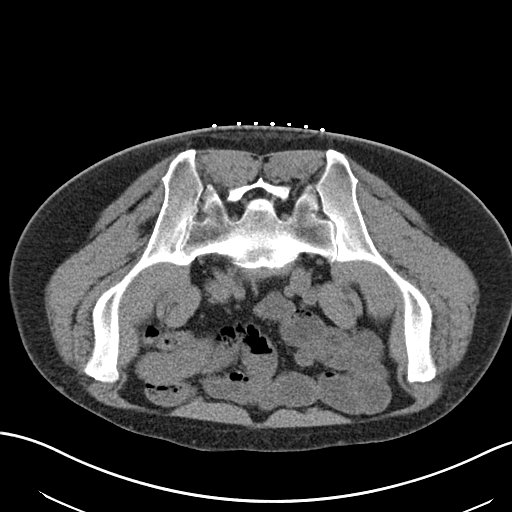
[im 17/38  bone]
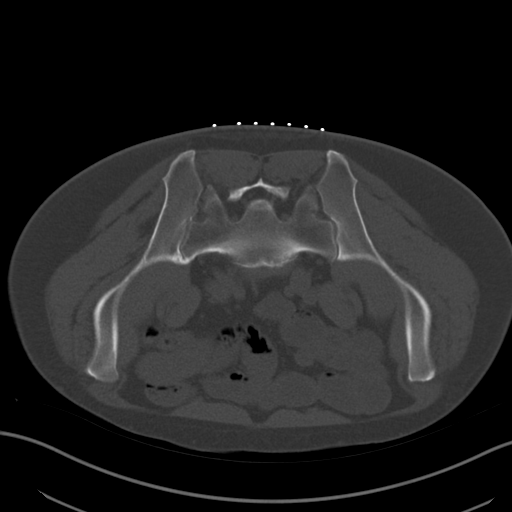
[im 21/38  bone]
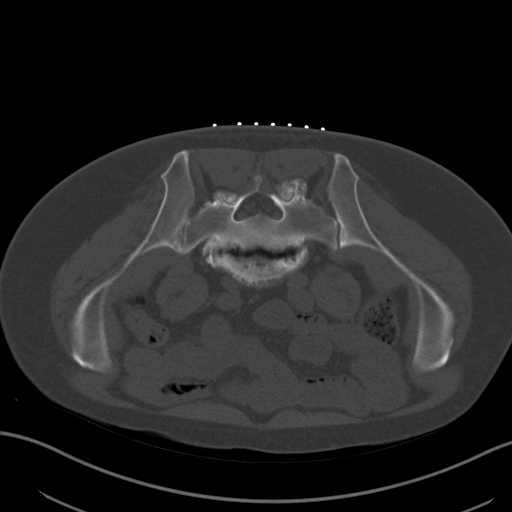
[im 24/38  bone]
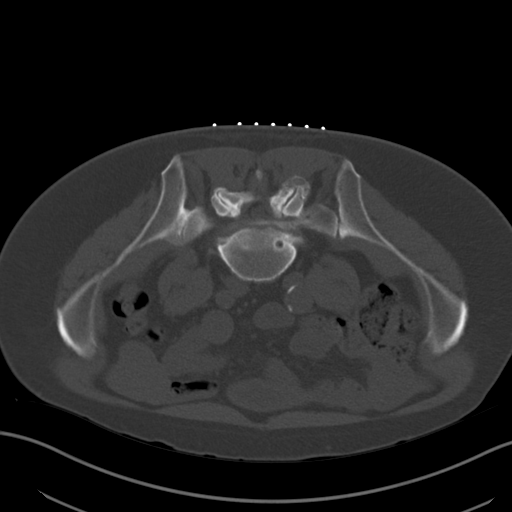
[im 27/38  bone]
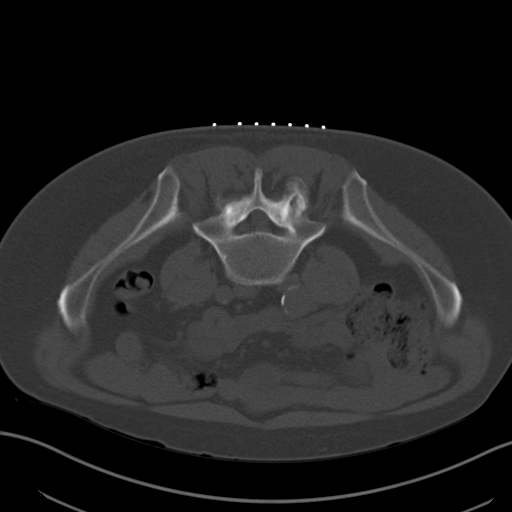
[im 31/38  soft-tissue]
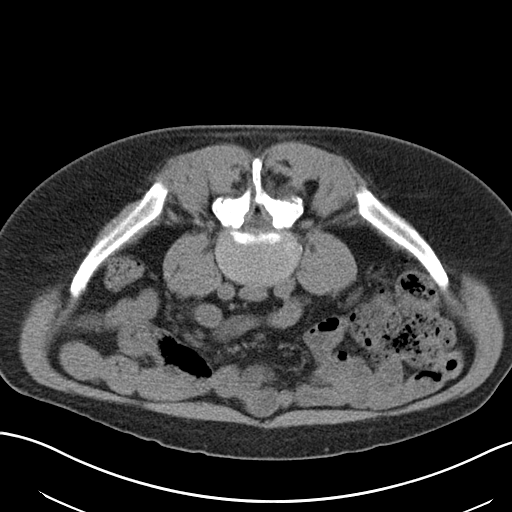
[im 31/38  bone]
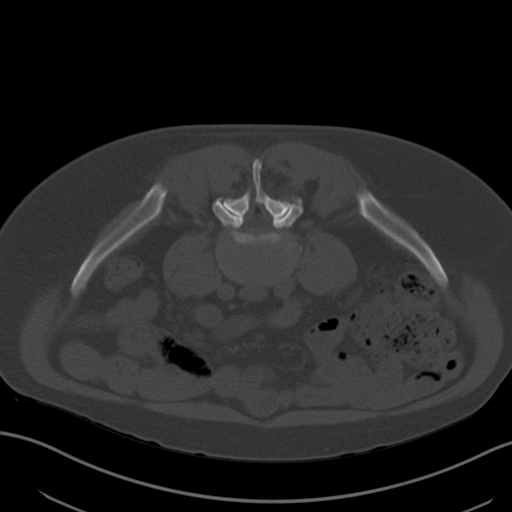
[im 34/38  bone]
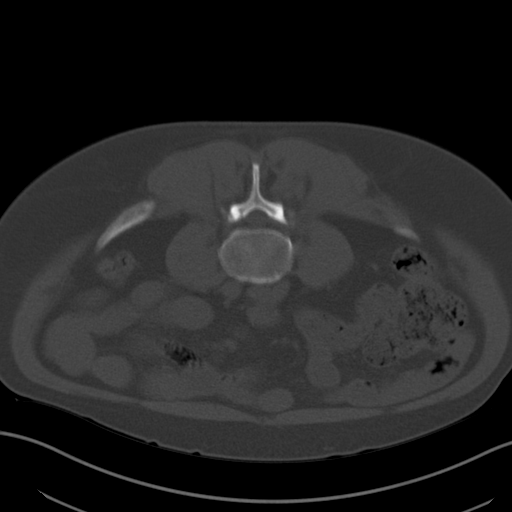

[10 of 14 positions shown; findings below may reference images not displayed]

EXAM:
CT-GUIDED BONE MARROW BIOPSY AND ASPIRATION

MEDICATIONS:
None

ANESTHESIA/SEDATION:
Fentanyl 100 mcg IV; Versed 2 mg IV

Sedation Time: 10 Minutes; The patient was continuously monitored
during the procedure by the interventional radiology nurse under my
direct supervision.

COMPLICATIONS:
None immediate.

PROCEDURE:
Informed consent was obtained from the patient following an
explanation of the procedure, risks, benefits and alternatives. The
patient understands, agrees and consents for the procedure. All
questions were addressed. A time out was performed prior to the
initiation of the procedure.

The patient was positioned prone and non-contrast localization CT
was performed of the pelvis to demonstrate the iliac marrow spaces.
The operative site was prepped and draped in the usual sterile
fashion.

Under sterile conditions and local anesthesia, a 22 gauge spinal
needle was utilized for procedural planning. Next, an 11 gauge
coaxial bone biopsy needle was advanced into the left iliac marrow
space. Needle position was confirmed with CT imaging. Initially, a
bone marrow aspiration was performed. Next, a bone marrow biopsy was
obtained with the 11 gauge outer bone marrow device. The needle was
removed and superficial hemostasis was obtained with manual
compression. A dressing was applied. The patient tolerated the
procedure well without immediate post procedural complication.
IMPRESSION: Successful CT guided left iliac bone marrow aspiration and core
biopsy.

## 2021-11-22 ENCOUNTER — Other Ambulatory Visit: Payer: Self-pay

## 2021-11-22 NOTE — Patient Instructions (Signed)
Visit Information ? ?Mr. Kantz was given information about Medicaid Managed Care team care coordination services as a part of their Healthy [facility] Medicaid benefit. [name] verbally consented to engagement with the [facility] Managed Care team.  ? ?If you are experiencing a medical emergency, please call 911 or report to your local emergency department or urgent care.  ? ?If you have a non-emergency medical problem during routine business hours, please contact your provider's office and ask to speak with a nurse.  ? ?For questions related to your Healthy [facility] health plan, please call: [phone] or visit the homepage here: [email or web address] ? ?If you would like to schedule transportation through your Healthy [facility] plan, please call the following number at least 2 days in advance of your appointment: [phone] ? For information about your ride after you set it up, call Ride Assist at [phone]. Use this number to activate a Will Call pickup, or if your transportation is late for a scheduled pickup. Use this number, too, if you need to make a change or cancel a previously scheduled reservation. ? If you need transportation services right away, call [phone]. The after-hours call center is staffed 24 hours to handle ride assistance and urgent reservation requests (including discharges) 365 days a year. Urgent trips include sick visits, hospital discharge requests and life-sustaining treatment. ? ?Call the [place] at [phone], at any time, 24 hours a day, 7 days a week. If you are in danger or need immediate medical attention call 911. ? ?If you would like help to quit smoking, call 1-800-QUIT-NOW 276 637 9620) OR Espa?ol: 1-855-D?jelo-Ya 304-064-8804) o para m?s informaci?n haga clic aqu? or Text READY to 200-400 to register via text ? ?Mr. Speir - following are the goals we discussed in your visit today:  ? Goals  Addressed   ? ?  ?  ?  ?  ? This Visit's Progress  ?  RNCM - Chronic Back Pain Management     ?  Timeframe:  Long-Range Goal ?Priority:  High ?Start Date:   05/09/2021                          ?Expected End Date: 01/25/2022 ? ?       ?Patient Goals: ?Patient will self administer medications as prescribed ?Patient will attend all scheduled provider appointments ?Patient will call pharmacy for medication refills ?Patient will continue to perform ADL's independently ?Patient will continue to perform IADL's independently ?Patient will call provider office for new concerns or questions ?Patient will work with BSW to address care coordination needs and will continue to work with the clinical team to address health care and disease management related needs.                  ?  ?  RNCM - CML Disease Progression Minimized or Managed     ?  Timeframe:  Long-Range Goal ?Priority:  High ?Start Date:     05/09/2021                       ?Expected End Date:  01/25/2022 ? ?   ?Patient Goals: ?Patient will self administer medications as prescribed ?Patient will attend all scheduled provider appointments ?Patient will call pharmacy for medication refills ?Patient will continue to perform ADL's independently ?Patient will continue to perform IADL's independently ?Patient will call provider office for new concerns or questions ?Patient will work with BSW to  address care coordination needs and will continue to work with the clinical team to address health care and disease management related needs.                          ?  ?  RNCM - Glycemic Management & Monitored     ?  Timeframe:  Long-Range Goal ?Priority:  High ?Start Date:     05/09/2021                        ?Expected End Date:  01/25/2022 ? ?Patient Goals: ?Patient will self administer medications as prescribed ?Patient will attend all scheduled provider appointments ?Patient will call pharmacy for medication refills ?Patient will continue to perform ADL's independently ?Patient will  continue to perform IADL's independently ?Patient will call provider office for new concerns or questions ?Patient will work with BSW to address care coordination needs and will continue to work with the clinical team to address health care and disease management related needs.             ?  ?  RNCM - Hypertension Monitored and Managed     ?  Timeframe:  Long-Range Goal ?Priority:  High ?Start Date:  05/09/2021                           ?Expected End Date:  6/1//2023 ? ?Patient Goals ?Patient will self administer medications as prescribed ?Patient will attend all scheduled provider appointments ?Patient will call pharmacy for medication refills ?Patient will continue to perform ADL's independently ?Patient will continue to perform IADL's independently ?Patient will call provider office for new concerns or questions ?Patient will work with BSW to address care coordination needs and will continue to work with the clinical team to address health care and disease management related needs.               ?  ? ?  ? ? ?Please see education materials related to today's visit provided by MyChart link. ? ?Patient verbalizes understanding of instructions and care plan provided today and agrees to view in [place]. Active MyChart status confirmed with patient.   ? ?The Managed Medicaid care management team will reach out to the patient again over the next 30 days.  ? ?[name] RN, BSN ?Community Care Coordinator ?[facility] ?Mobile: [phone]  ? ?Following is a copy of your plan of care:  ?Care Plan : [place]  ?Updates made by [name], RN since 11/22/2021 12:00 AM  ?  ? ?Problem: Chronic Disease Management and Care Coordination Needs for DM, HTN, CML and Back Pain   ?  ? ?Long-Range Goal: Plan of Care for Chronic Disease management and Care Coordination Needs (HTN, DM, CML, Chronic Back Pain)   ?Start Date: 05/09/2021  ?Expected End Date: 01/25/2022  ?Priority: High   ?Note:   ?Current Barriers:  ?Knowledge Deficits related to plan of care for management of HTN, DMII, and CML and Chronic Back Pain  ?Care Coordination needs related to Financial constraints related to paying back taxes on house and Limited social support  ?Chronic Disease Management support and education needs related to HTN, DMII, and CML and Chronic Back Pain ?Film/video editor.  ?Transportation barriers ?Non-adherence to prescribed medication regimen ?No Advanced Directives in place ? ?RNCM Clinical Goal(s):  ?Patient will verbalize understanding of plan for management  of HTN, DMII, and CML and Chronic Back Pain ?verbalize basic understanding of HTN, DMII, and CML and Chronic Back Pain disease process and self health management plan  ?take all medications exactly as prescribed and will call provider for medication related questions ?attend all scheduled medical appointments: 12/12/21 Lab appointment; 12/19/21 Dr. Grayland Ormond; 12/26/21 Dr Loanne [name]: 01/16/22 PCP office visit. ?demonstrate ongoing adherence to prescribed treatment plan for HTN, DMII, and CML and Chronic Back Pain as evidenced by daily monitoring and recording of CBG, adherence to ADA/ carb modified diet, adherence to prescribed medication regimen, contacting provider for new or worsened symptoms or questions  ?demonstrate ongoing health management independence  ?continue to work with RN Care Manager to address care management and care coordination needs related to HTN, DMII, and CML and Chronic Back Pain  ?work with pharmacist to address complex medication regimen  related to HTN, DMII, and CML and Chronic Back Pain ?work with Education officer, museum to address financial constraints related to overdue payment of back taxes and potential risk of losing home, Limited social support and counseling for anxiety/stress related to the management of HTN, DMII, and CML and Chronic Back Pain. ?collaborate with the care management team towards completion of advanced  directives  through collaboration with RN Care manager, provider, and care team.  ? ?Interventions: ?Inter-disciplinary care team collaboration (see longitudinal plan of care) ?Evaluation of current treatment plan rel

## 2021-11-22 NOTE — Patient Outreach (Signed)
?Medicaid Managed Care   ?Nurse Care Manager Note ? ?11/22/2021 ?Name:  Gerald Olson MRN:  [id number] DOB:  08/01/1966 ? ?KENN Gerald Olson is an 56 y.o. year old male who is a primary patient of Thedore Mins, Ria Comment, [place].  The [facility] Managed Care Coordination team was consulted for assistance with:    ?HTN ?DMII ?CML ?Chronic Back Pain ? ?[name] was given information about Medicaid Managed Care Coordination team services today. Gerald Olson Patient agreed to services and verbal consent obtained. ? ?Engaged with patient by telephone for follow up visit in response to provider referral for case management and/or care coordination services.  ? ?Assessments/Interventions:  Review of past medical history, allergies, medications, health status, including review of consultants reports, laboratory and other test data, was performed as part of comprehensive evaluation and provision of chronic care management services. ? ?SDOH (Social Determinants of Health) assessments and interventions performed: ? ? ?Care Plan ? ?No Known Allergies ? ?Medications Reviewed Today   ? ? Reviewed by [name], RN (Case Manager) on 11/22/21 at 55  Med List Status: <None>  ? ?Medication Order Taking? Sig Documenting Provider Last Dose Status Informant  ?acetaZOLAMIDE ER (DIAMOX) 500 MG capsule [id number] No Take 1 capsule (500 mg total) by mouth 2 (two) times daily  Taking Active   ?amoxicillin (AMOXIL) 500 MG capsule [id number]  Take 1 capsule by mouth every 8 hours until finished   Active   ?chlorhexidine (PERIDEX) 0.12 % solution [id number]  Swish for one minute and spit twice daiy for 10 days as dircted.   Active   ?Patient not taking:  Discontinued 08/09/20 1309 (Patient Preference)   ?ibuprofen (IBU) 800 MG tablet [id number]  Take 1 tablet every 8 hours as needed for pain   Active   ?imatinib (GLEEVEC) 400 MG tablet [id number]  Take 1 tablet (400 mg total) by mouth daily. [name], RPH-CPP  Active   ?insulin glargine (LANTUS  SOLOSTAR) 100 UNIT/ML Solostar Pen [id number]  Inject 5 Units into the skin every morning. [name], MD  Active   ?Insulin Pen Needle (UNIFINE PENTIPS) 32G X 4 MM MISC [id number]  Use to inject Victoza once a day [name], MD  Active   ?lisinopril (ZESTRIL) 10 MG tablet [id number] No Take 1 tablet (10 mg total) by mouth daily.  Taking Active   ?traMADol (ULTRAM) 50 MG tablet [id number] No TAKE 1 - 2 TABLETS BY MOUTH 3 TIMES DAILY AS NEEDED FOR SEVERE PAIN. MAY CAUSE DROWSINESS. Birdie Sons, MD Taking Active   ?Med List Note [name], RPH-CPP 07/28/20 1202): Gleevec (imatinib) filled at [facility]  ? ?  ?  ? ?  ? ? ?Patient Active Problem List  ? Diagnosis Date Noted  ? Other fatigue 09/20/2021  ? Spondylolisthesis at L5-S1 level 01/24/2021  ? Cervical radicular pain 10/26/2020  ? Lumbar facet arthropathy 10/26/2020  ? Lumbar degenerative disc disease 10/26/2020  ? Opiate misuse 10/26/2020  ? 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 Mesenteric lymphadenopathy 06/14/2020  ? Splenomegaly- mild  06/14/2020  ? History of cannabis abuse 06/14/2020  ? Recurrent major depressive disorder, in partial remission ([place]) 06/09/2020  ? Anxiety 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 06/09/2020  ? Alcohol use disorder, moderate, dependence ([place]) 06/09/2020  ? Frequent falls 05/23/2020  ? Foraminal stenosis of cervical region 05/09/2020  ? Diabetic polyneuropathy   ? ADHD (attention deficit hyperactivity disorder)   ? Generalized anxiety  disorder   ? Major depressive disorder   ? Cervical spondylosis 09/22/2019  ? Essential hypertension, benign 01/28/2019  ? Alcoholic cirrhosis of liver 06/02/2018  ? Type 2 diabetes mellitus with hyperglycemia 04/25/2018  ? ? ?Conditions to be addressed/monitored per PCP order:  HTN, DMII, and CML, Chronic Back Pain ? ?Care Plan : RNCM Plan of Care  ?Updates made by [name], RN since 11/22/2021 12:00 AM  ?   ? ?Problem: Chronic Disease Management and Care Coordination Needs for DM, HTN, CML and Back Pain   ?  ? ?Long-Range Goal: Plan of Care for Chronic Disease management and Care Coordination Needs (HTN, DM, CML, Chronic Back Pain)   ?Start Date: 05/09/2021  ?Expected End Date: 01/25/2022  ?Priority: High  ?Note:   ?Current Barriers:  ?Knowledge Deficits related to plan of care for management of HTN, DMII, and CML and Chronic Back Pain  ?Care Coordination needs related to Financial constraints related to paying back taxes on house and Limited social support  ?Chronic Disease Management support and education needs related to HTN, DMII, and CML and Chronic Back Pain ?Film/video editor.  ?Transportation barriers ?Non-adherence to prescribed medication regimen ?No Advanced Directives in place ? ?RNCM Clinical Goal(s):  ?Patient will verbalize understanding of plan for management of HTN, DMII, and CML and Chronic Back Pain ?verbalize basic understanding of HTN, DMII, and CML and Chronic Back Pain disease process and self health management plan  ?take all medications exactly as prescribed and will call provider for medication related questions ?attend all scheduled medical appointments: 12/12/21 Lab appointment; 12/19/21 Dr. Grayland Ormond; 12/26/21 Dr Loanne [name]: 01/16/22 PCP office visit. ?demonstrate ongoing adherence to prescribed treatment plan for HTN, DMII, and CML and Chronic Back Pain as evidenced by daily monitoring and recording of CBG, adherence to ADA/ carb modified diet, adherence to prescribed medication regimen, contacting provider for new or worsened symptoms or questions  ?demonstrate ongoing health management independence  ?continue to work with RN Care Manager to address care management and care coordination needs related to HTN, DMII, and CML and Chronic Back Pain  ?work with pharmacist to address complex medication regimen  related to HTN, DMII, and CML and Chronic Back Pain ?work with Education officer, museum to address  financial constraints related to overdue payment of back taxes and potential risk of losing home, Limited social support and counseling for anxiety/stress related to the management of HTN, DMII, and CML and Chronic Back Pain. ?collaborate with the care management team towards completion of advanced directives  through collaboration with RN Care manager, provider, and care team.  ? ?Interventions: ?Inter-disciplinary care team collaboration (see longitudinal plan of care) ?Evaluation of current treatment plan related to  self management and patient's adherence to plan as established by provider ? ? ?Diabetes:  (Status: Goal on Track (progressing): YES.) ? ?Lab Results  ?Component Value Date  ? HGBA1C 6.0 (A) 09/22/2021  ?  ?Assessed patient's understanding of A1c goal: <7% ?Continue to provided education to patient about basic DM disease process; ?Reviewed medications with patient and discussed importance of medication adherence. Patient compliant with medications. ?Counseled on importance of regular laboratory monitoring as prescribed;        ?Discussed plans with patient for ongoing care management follow up and provided patient with direct contact information for care management team;      ?Reviewed scheduled/upcoming provider appointments including: See RNCM Clinical Goals above.      ?Patient reports fasting blood sugars range 130 - 145.  Patient checked  blood sugar during today's visit at 3:15 pm with result 165.  Patient reports eating soft fluids and liquids since he had left bottom back molar removed 48 hours ago (11/20/2021).  Denies any issues with tooth extraction: no pain or bleeding.  Reviewed how infection can increase blood sugar level. Patient reports no hypoglycemic or hyperglycemic episodes.       ?Call provider for findings outside established parameters;       ?Referral made to pharmacy team previously for assistance with complex medication regimen.  Patient doing well with medication compliance  and his understanding of all medications.  Pharmacist referral not needed at this time.       ?Review of patient status, including review of consultants reports, relevant laboratory and other test results, and medi

## 2021-11-23 ENCOUNTER — Other Ambulatory Visit (HOSPITAL_COMMUNITY): Payer: Self-pay

## 2021-11-30 ENCOUNTER — Other Ambulatory Visit: Payer: Self-pay

## 2021-11-30 NOTE — Patient Outreach (Addendum)
?Medicaid Managed Care ?Social Work Note ? ?11/30/2021 ?Name:  Gerald Olson MRN:  [id number] DOB:  January 25, 1966 ? ?Gerald Olson is an 56 y.o. year old male who is a primary patient of Thedore Olson, Gerald Olson, [place].  The [facility] Managed Care Coordination team was consulted for assistance with:  Community Resources  ? ?[name] was given information about Medicaid Managed Care Coordination team services today. Gerald Olson Patient agreed to services and verbal consent obtained. ? ?Engaged with patient  for by telephone forfollow up visit in response to referral for case management and/or care coordination services.  ? ?Assessments/Interventions:  Review of past medical history, allergies, medications, health status, including review of consultants reports, laboratory and other test data, was performed as part of comprehensive evaluation and provision of chronic care management services. ? ?SDOH: (Social Determinant of Health) assessments and interventions performed: ?BSW completed telephone outreach with patient. He stated he did have a recent fall but fell on his side. Patient states he is in the process of trying to rebuild his porch, BSW provided patient with Healthy [facility] repair benefit. Patient states he has not found his tax paperwork yet, patient states he does not through anything away and he has a lot of boxes all over the house. Patient stated he did go to [facility] and got his tooth pulled, he states he is feeling a lot better.  Patient states no other resources are needed at this time.  ? ?Advanced Directives Status:  Not addressed in this encounter. ? ?Care Plan ?                ?No Known Allergies ? ?Medications Reviewed Today   ? ? Reviewed by [name], RN (Case Manager) on 11/22/21 at 51  Med List Status: <None>  ? ?Medication Order Taking? Sig Documenting Provider Last Dose Status Informant  ?acetaZOLAMIDE ER (DIAMOX) 500 MG capsule [id number] No Take 1 capsule (500 mg total) by mouth 2  (two) times daily  Taking Active   ?amoxicillin (AMOXIL) 500 MG capsule [id number]  Take 1 capsule by mouth every 8 hours until finished   Active   ?chlorhexidine (PERIDEX) 0.12 % solution [id number]  Swish for one minute and spit twice daiy for 10 days as dircted.   Active   ?Patient not taking:  Discontinued 08/09/20 1309 (Patient Preference)   ?ibuprofen (IBU) 800 MG tablet [id number]  Take 1 tablet every 8 hours as needed for pain   Active   ?imatinib (GLEEVEC) 400 MG tablet [id number]  Take 1 tablet (400 mg total) by mouth daily. [name], RPH-CPP  Active   ?insulin glargine (LANTUS SOLOSTAR) 100 UNIT/ML Solostar Pen [id number]  Inject 5 Units into the skin every morning. [name], MD  Active   ?Insulin Pen Needle (UNIFINE PENTIPS) 32G X 4 MM MISC [id number]  Use to inject Victoza once a day [name], MD  Active   ?lisinopril (ZESTRIL) 10 MG tablet [id number] No Take 1 tablet (10 mg total) by mouth daily.  Taking Active   ?traMADol (ULTRAM) 50 MG tablet [id number] No TAKE 1 - 2 TABLETS BY MOUTH 3 TIMES DAILY AS NEEDED FOR SEVERE PAIN. MAY CAUSE DROWSINESS. Birdie Sons, MD Taking Active   ?Med List Note [name], RPH-CPP 07/28/20 1202): Gleevec (imatinib) filled at [place]  ? ?  ?  ? ?  ? ? ?Patient Active Problem List  ? Diagnosis Date Noted  ? Other fatigue 09/20/2021  ?  Spondylolisthesis at L5-S1 level 01/24/2021  ? Cervical radicular pain 10/26/2020  ? Lumbar facet arthropathy 10/26/2020  ? Lumbar degenerative disc disease 10/26/2020  ? Opiate misuse 10/26/2020  ? 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 Mesenteric lymphadenopathy 06/14/2020  ? Splenomegaly- mild  06/14/2020  ? History of cannabis abuse 06/14/2020  ? Recurrent major depressive disorder, in partial remission ([place]) 06/09/2020  ? Anxiety 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 06/09/2020  ? Alcohol use disorder, moderate, dependence ([place]) 06/09/2020  ? Frequent  falls 05/23/2020  ? Foraminal stenosis of cervical region 05/09/2020  ? Diabetic polyneuropathy   ? ADHD (attention deficit hyperactivity disorder)   ? Generalized anxiety disorder   ? Major depressive disorder   ? Cervical spondylosis 09/22/2019  ? Essential hypertension, benign 01/28/2019  ? Alcoholic cirrhosis of liver 06/02/2018  ? Type 2 diabetes mellitus with hyperglycemia 04/25/2018  ? ? ?Conditions to be addressed/monitored per PCP order:   community resources ? ?There are no care plans that you recently modified to display for this patient. ? ? ?Follow up:  Patient agrees to Care Plan and Follow-up. ? ?Plan: The Managed Medicaid care management team will reach out to the patient again over the next 30 days. ? ?Date/time of next scheduled Social Work care management/care coordination outreach:  12/31/21 ? ?[name], BSW, [place] ?[place]  ?High Risk Managed Medicaid Team  ?[phone]  ?

## 2021-11-30 NOTE — Patient Instructions (Signed)
Visit Information ? ?Mr. Zeitlin was given information about Medicaid Managed Care team care coordination services as a part of their Healthy [facility] Medicaid benefit. [name] verbally consented to engagement with the [facility] Managed Care team.  ? ?If you are experiencing a medical emergency, please call 911 or report to your local emergency department or urgent care.  ? ?If you have a non-emergency medical problem during routine business hours, please contact your provider's office and ask to speak with a nurse.  ? ?For questions related to your Healthy [facility] health plan, please call: [phone] or visit the homepage here: [email or web address] ? ?If you would like to schedule transportation through your Healthy [facility] plan, please call the following number at least 2 days in advance of your appointment: [phone] ? For information about your ride after you set it up, call Ride Assist at [phone]. Use this number to activate a Will Call pickup, or if your transportation is late for a scheduled pickup. Use this number, too, if you need to make a change or cancel a previously scheduled reservation. ? If you need transportation services right away, call [phone]. The after-hours call center is staffed 24 hours to handle ride assistance and urgent reservation requests (including discharges) 365 days a year. Urgent trips include sick visits, hospital discharge requests and life-sustaining treatment. ? ?Call the [place] at [phone], at any time, 24 hours a day, 7 days a week. If you are in danger or need immediate medical attention call 911. ? ?If you would like help to quit smoking, call 1-800-QUIT-NOW 902-546-0777) OR Espa?ol: 1-855-D?jelo-Ya (631)850-7360) o para m?s informaci?n haga clic aqu? or Text READY to 200-400 to register via text ? ?Mr. Shutters - following are the goals we discussed in your visit today:  ? Goals  Addressed   ?None ?  ? ? ?Social Worker will follow up in 30 days .  ? ?[name], BSW, [place] ?[place]  ?High Risk Managed Medicaid Team  ?[phone]  ? ?Following is a copy of your plan of care:  ?There are no care plans that you recently modified to display for this patient. ? ?  ?

## 2021-12-06 ENCOUNTER — Telehealth: Payer: Self-pay | Admitting: Oncology

## 2021-12-06 ENCOUNTER — Telehealth: Payer: Self-pay | Admitting: *Deleted

## 2021-12-06 NOTE — Telephone Encounter (Signed)
Patient called stating that he cannot make appointment on 18th, but can come 19th and to call him back to reschedule appointment but do not call in mornings ?

## 2021-12-06 NOTE — Telephone Encounter (Signed)
Pt called in stating that he needs to resch appt, has another appt in [place] same day K[facility]  ?

## 2021-12-12 ENCOUNTER — Other Ambulatory Visit: Payer: Medicaid Other

## 2021-12-13 ENCOUNTER — Inpatient Hospital Stay: Payer: Medicaid Other | Attending: Oncology

## 2021-12-13 DIAGNOSIS — E1142 Type 2 diabetes mellitus with diabetic polyneuropathy: Secondary | ICD-10-CM | POA: Diagnosis not present

## 2021-12-13 DIAGNOSIS — G8929 Other chronic pain: Secondary | ICD-10-CM | POA: Diagnosis not present

## 2021-12-13 DIAGNOSIS — Z87891 Personal history of nicotine dependence: Secondary | ICD-10-CM | POA: Diagnosis not present

## 2021-12-13 DIAGNOSIS — D649 Anemia, unspecified: Secondary | ICD-10-CM | POA: Diagnosis not present

## 2021-12-13 DIAGNOSIS — I10 Essential (primary) hypertension: Secondary | ICD-10-CM | POA: Diagnosis not present

## 2021-12-13 DIAGNOSIS — C9210 Chronic myeloid leukemia, BCR/ABL-positive, not having achieved remission: Secondary | ICD-10-CM | POA: Insufficient documentation

## 2021-12-13 DIAGNOSIS — R161 Splenomegaly, not elsewhere classified: Secondary | ICD-10-CM | POA: Insufficient documentation

## 2021-12-13 DIAGNOSIS — M542 Cervicalgia: Secondary | ICD-10-CM | POA: Diagnosis not present

## 2021-12-13 DIAGNOSIS — R197 Diarrhea, unspecified: Secondary | ICD-10-CM | POA: Insufficient documentation

## 2021-12-13 LAB — COMPREHENSIVE METABOLIC PANEL
ALT: 33 U/L (ref 0–44)
AST: 60 U/L — ABNORMAL HIGH (ref 15–41)
Albumin: 3.7 g/dL (ref 3.5–5.0)
Alkaline Phosphatase: 82 U/L (ref 38–126)
Anion gap: 5 (ref 5–15)
BUN: 23 mg/dL — ABNORMAL HIGH (ref 6–20)
CO2: 25 mmol/L (ref 22–32)
Calcium: 8.5 mg/dL — ABNORMAL LOW (ref 8.9–10.3)
Chloride: 104 mmol/L (ref 98–111)
Creatinine, Ser: 1.02 mg/dL (ref 0.61–1.24)
GFR, Estimated: >60 mL/min (ref >60)
Glucose, Bld: 164 mg/dL — ABNORMAL HIGH (ref 70–99)
Potassium: 4.2 mmol/L (ref 3.5–5.1)
Sodium: 134 mmol/L — ABNORMAL LOW (ref 135–145)
Total Bilirubin: 0.8 mg/dL (ref 0.3–1.2)
Total Protein: 6.4 g/dL — ABNORMAL LOW (ref 6.5–8.1)

## 2021-12-13 LAB — CBC WITH DIFFERENTIAL/PLATELET
Abs Immature Granulocytes: 0.02 10*3/uL (ref 0.00–0.07)
Basophils Absolute: 0 10*3/uL (ref 0.0–0.1)
Basophils Relative: 1 %
Eosinophils Absolute: 0.2 10*3/uL (ref 0.0–0.5)
Eosinophils Relative: 4 %
HCT: 36 % — ABNORMAL LOW (ref 39.0–52.0)
Hemoglobin: 11.4 g/dL — ABNORMAL LOW (ref 13.0–17.0)
Immature Granulocytes: 0 %
Lymphocytes Relative: 19 %
Lymphs Abs: 1 10*3/uL (ref 0.7–4.0)
MCH: 28.2 pg (ref 26.0–34.0)
MCHC: 31.7 g/dL (ref 30.0–36.0)
MCV: 89.1 fL (ref 80.0–100.0)
Monocytes Absolute: 0.4 10*3/uL (ref 0.1–1.0)
Monocytes Relative: 7 %
Neutro Abs: 3.5 10*3/uL (ref 1.7–7.7)
Neutrophils Relative %: 69 %
Platelets: 236 10*3/uL (ref 150–400)
RBC: 4.04 MIL/uL — ABNORMAL LOW (ref 4.22–5.81)
RDW: 13.7 % (ref 11.5–15.5)
WBC: 5.1 10*3/uL (ref 4.0–10.5)
nRBC: 0 % (ref 0.0–0.2)

## 2021-12-13 LAB — PHOSPHORUS: Phosphorus: 3.2 mg/dL (ref 2.5–4.6)

## 2021-12-13 LAB — MAGNESIUM: Magnesium: 2.2 mg/dL (ref 1.7–2.4)

## 2021-12-15 NOTE — Progress Notes (Signed)
[place] [facility]  ?[phone] [phone] ? ?ID: Gerald Olson OB: 04/18/1966  MR#: [id number]  XAJ#:[id number] ? ?Patient Care Team: ?Emelia Loron as PCP - General (Physician Assistant) ?[name], RN as Case Manager ?Ethelda Chick as Social Worker ?[name], LCSW as [facility] (Licensed Holiday representative) ?Hughes Better, RPH-CPP (Inactive) (Pharmacist) ? ?I connected with Gerald Olson on 12/20/21 at  2:15 PM EDT by video enabled telemedicine visit and verified that I am speaking with the correct person using two identifiers.  ? ?I discussed the limitations, risks, security and privacy concerns of performing an evaluation and management service by telemedicine and the availability of in-person appointments. I also discussed with the patient that there may be a patient responsible charge related to this service. The patient expressed understanding and agreed to proceed.  ? ?Other persons participating in the visit and their role in the encounter: Patient, MD. ? ?Patient?s location: Home. ?Provider?s location: Clinic. ? ?CHIEF COMPLAINT: CML. ? ?INTERVAL HISTORY: Patient agreed to video assisted telemedicine visit for further evaluation and discussion of his laboratory results.  He continues to tolerate Gleevec well without significant side effects.  He admits to occasional diarrhea, but feels this is more related to his dietary intake.  He continues to have chronic back and neck pain.  He has no neurologic complaints. He denies any recent fevers or illnesses. He has a good appetite and denies weight loss. He has no chest pain, shortness of breath, cough, or hemoptysis. He denies any nausea, vomiting, constipation, or diarrhea. He has no melena or hematochezia. He has no urinary complaints.  Patient offers no further specific complaints today. ? ?REVIEW OF SYSTEMS:   ?Review of Systems  ?Constitutional:  Negative.  Negative for fever, malaise/fatigue and weight loss.  ?Respiratory: Negative.  Negative for cough, hemoptysis and shortness of breath.   ?Cardiovascular: Negative.  Negative for chest pain and leg swelling.  ?Gastrointestinal:  Positive for diarrhea. Negative for abdominal pain and blood in stool.  ?Genitourinary: Negative.  Negative for dysuria, flank pain and hematuria.  ?Musculoskeletal:  Positive for back pain and neck pain.  ?Skin: Negative.  Negative for rash.  ?Neurological: Negative.  Negative for dizziness, focal weakness, weakness and headaches.  ?Psychiatric/Behavioral: Negative.  The patient is not nervous/anxious.   ? ?As per HPI. Otherwise, a complete review of systems is negative. ? ?PAST MEDICAL HISTORY: ?Past Medical History:  ?Diagnosis Date  ? ADHD (attention deficit hyperactivity disorder)   ? Diagnosed during teenage years  ? Alcohol use disorder   ? On average 14-21 drinks per week  ? Alcoholic cirrhosis of liver   ? Per patient - condition is not related to moderate alcohol use but rather prolonged NSAID use and recent abuse.  ? Allergy   ? Calculus of gallbladder without cholecystitis without obstruction   ? Cancer [facility])   ? leukemia  ? Chronic pain   ? Neck/lower back  ? Degenerative disc disease, lumbar   ? Diabetic polyneuropathy   ? Essential hypertension, benign   ? Generalized anxiety disorder   ? Glaucoma   ? Per patient report  ? Headache   ? History of concussion 2006  ? 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 Major depressive disorder   ? Other spondylosis with radiculopathy, cervical region   ? Retinopathy   ? Right eye; per patient report  ? Type 2 diabetes mellitus with  hyperglycemia   ? ? ?PAST SURGICAL HISTORY: ?Past Surgical History:  ?Procedure Laterality Date  ? CHOLECYSTECTOMY N/A 06/02/2018  ? Procedure: LAPAROSCOPIC CHOLECYSTECTOMY;  Surgeon: Vickie Epley, MD;  Location: [facility] ORS;  Service: General;  Laterality: N/A;  ? KNEE  ARTHROSCOPY    ? 30 Years ago  ? lumbar five-sacral one posterior lumbar interbody fusion  01/24/2021  ? ? ?FAMILY HISTORY: ?Family History  ?Problem Relation Age of Onset  ? Aneurysm Father   ? Drug abuse Sister   ? Multiple sclerosis Sister   ? Suicidality Maternal Aunt   ? Emphysema Maternal Grandfather   ? Diabetes Neg Hx   ? ? ?ADVANCED DIRECTIVES (Y/N):  N ? ?HEALTH MAINTENANCE: ?Social History  ? ?Tobacco Use  ? Smoking status: Former  ?  Types: Cigarettes  ?  Quit date: 06/25/2003  ?  Years since quitting: 18.5  ? Smokeless tobacco: Never  ? Tobacco comments:  ?  Currently vapes  ?Vaping Use  ? Vaping Use: Every day  ?Substance Use Topics  ? Alcohol use: Yes  ?  Alcohol/week: 14.0 - 21.0 standard drinks  ?  Types: 14 - 21 Cans of beer per week  ?  Comment: 2-3 beers per night on average; sometimes up to 6  ? Drug use: Yes  ?  Types: Marijuana  ?  Comment: 1/week to help sleep; edibles, does not smoke  ? ? ? Colonoscopy: ? PAP: ? Bone density: ? Lipid panel: ? ?No Known Allergies ? ? ?Current Outpatient Medications  ?Medication Sig Dispense Refill  ? acetaZOLAMIDE ER (DIAMOX) 500 MG capsule Take 1 capsule (500 mg total) by mouth 2 (two) times daily 60 capsule 2  ? ibuprofen (IBU) 800 MG tablet Take 1 tablet every 8 hours as needed for pain 20 tablet 0  ? imatinib (GLEEVEC) 400 MG tablet Take 1 tablet (400 mg total) by mouth daily. 30 tablet 5  ? insulin glargine (LANTUS SOLOSTAR) 100 UNIT/ML Solostar Pen Inject 5 Units into the skin every morning. 15 mL 3  ? Insulin Pen Needle (UNIFINE PENTIPS) 32G X 4 MM MISC Use to inject Victoza once a day 100 each 11  ? lisinopril (ZESTRIL) 10 MG tablet Take 1 tablet (10 mg total) by mouth daily. 90 tablet 3  ? traMADol (ULTRAM) 50 MG tablet TAKE 1 - 2 TABLETS BY MOUTH 3 TIMES DAILY AS NEEDED FOR SEVERE PAIN. MAY CAUSE DROWSINESS. 60 tablet 1  ? amoxicillin (AMOXIL) 500 MG capsule Take 1 capsule by mouth every 8 hours until finished (Patient not taking: Reported on  12/19/2021) 21 capsule 0  ? chlorhexidine (PERIDEX) 0.12 % solution Swish for one minute and spit twice daiy for 10 days as dircted. (Patient not taking: Reported on 12/19/2021) 473 mL 0  ? ?No current facility-administered medications for this visit.  ? ? ?OBJECTIVE: ?There were no vitals filed for this visit. ?   There is no height or weight on file to calculate BMI.    ECOG FS:0 - Asymptomatic ? ?General: Well-developed, well-nourished, no acute distress. ?HEENT: Normocephalic. ?Neuro: Alert, answering all questions appropriately. Cranial nerves grossly intact. ?Psych: Normal affect. ? ?LAB RESULTS: ? ?Lab Results  ?Component Value Date  ? NA 134 (L) 12/13/2021  ? K 4.2 12/13/2021  ? CL 104 12/13/2021  ? CO2 25 12/13/2021  ? GLUCOSE 164 (H) 12/13/2021  ? BUN 23 (H) 12/13/2021  ? CREATININE 1.02 12/13/2021  ? CALCIUM 8.5 (L) 12/13/2021  ? PROT 6.4 (L) 12/13/2021  ?  ALBUMIN 3.7 12/13/2021  ? AST 60 (H) 12/13/2021  ? ALT 33 12/13/2021  ? ALKPHOS 82 12/13/2021  ? BILITOT 0.8 12/13/2021  ? GFRNONAA >60 12/13/2021  ? GFRAA 90 09/14/2020  ? ? ?Lab Results  ?Component Value Date  ? WBC 5.1 12/13/2021  ? NEUTROABS 3.5 12/13/2021  ? HGB 11.4 (L) 12/13/2021  ? HCT 36.0 (L) 12/13/2021  ? MCV 89.1 12/13/2021  ? PLT 236 12/13/2021  ? ? ? ?STUDIES: ?No results found. ? ? ?ASSESSMENT: CML ? ?PLAN:   ? ?1.  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  Repeat CT scan results from July 18, 2020 reviewed independently with only mild abdominal lymphadenopathy and mild splenomegaly that are chronic and unchanged.  Bone marrow biopsy was negative for disease.  Patient was initiated on 400 mg Gleevec in approximately November 2021 and has been in and continues to be in a complete molecular remission since January 2022.  Continue Gleevec 400 mg daily as prescribed.  Return to clinic in 3 months for laboratory work only and then in 6 months for laboratory  work and video assisted telemedicine visit.   ?2.  Anemia: Chronic and unchanged.  Patient's most recent hemoglobin is 11.4. ?3. Splenomegaly/mesenteric lymphadenopathy: Chronic and unchanged.  Unclear if this is related

## 2021-12-18 ENCOUNTER — Telehealth: Payer: Self-pay | Admitting: Oncology

## 2021-12-18 LAB — BCR-ABL1, CML/ALL, PCR, QUANT: Interpretation (BCRAL):: NEGATIVE

## 2021-12-18 NOTE — Telephone Encounter (Signed)
Pt called in to change md visit to Virtual, unable to make it in person due to finances...KJ  ?

## 2021-12-19 ENCOUNTER — Other Ambulatory Visit (HOSPITAL_COMMUNITY): Payer: Self-pay

## 2021-12-19 ENCOUNTER — Inpatient Hospital Stay (HOSPITAL_BASED_OUTPATIENT_CLINIC_OR_DEPARTMENT_OTHER): Payer: Medicaid Other | Admitting: Oncology

## 2021-12-19 DIAGNOSIS — R197 Diarrhea, unspecified: Secondary | ICD-10-CM

## 2021-12-19 DIAGNOSIS — E1140 Type 2 diabetes mellitus with diabetic neuropathy, unspecified: Secondary | ICD-10-CM

## 2021-12-19 DIAGNOSIS — Z87891 Personal history of nicotine dependence: Secondary | ICD-10-CM | POA: Diagnosis not present

## 2021-12-19 DIAGNOSIS — M542 Cervicalgia: Secondary | ICD-10-CM | POA: Diagnosis not present

## 2021-12-19 DIAGNOSIS — D649 Anemia, unspecified: Secondary | ICD-10-CM

## 2021-12-19 DIAGNOSIS — M549 Dorsalgia, unspecified: Secondary | ICD-10-CM | POA: Diagnosis not present

## 2021-12-19 DIAGNOSIS — I10 Essential (primary) hypertension: Secondary | ICD-10-CM

## 2021-12-19 DIAGNOSIS — C9210 Chronic myeloid leukemia, BCR/ABL-positive, not having achieved remission: Secondary | ICD-10-CM | POA: Diagnosis not present

## 2021-12-19 NOTE — Progress Notes (Signed)
Pt reports trouble sleeping, neuropathy worse in left leg. Pt asking about possibility of reduced dose of gleevac d/t "digestive issues" x1 yr. Pt c/o diarrhea. ?

## 2021-12-20 ENCOUNTER — Other Ambulatory Visit: Payer: Self-pay

## 2021-12-20 NOTE — Patient Instructions (Signed)
Visit Information ? ?[name] was given information about Medicaid Managed Care team care coordination services as a part of their Healthy [facility] Medicaid benefit. [name] verbally consented to engagement with the [facility] Managed Care team.  ? ?If you are experiencing a medical emergency, please call 911 or report to your local emergency department or urgent care.  ? ?If you have a non-emergency medical problem during routine business hours, please contact your provider's office and ask to speak with a nurse.  ? ?For questions related to your Healthy [name] health plan, please call: [phone] or visit the homepage here: [email or web address] ? ?If you would like to schedule transportation through your Healthy [facility] plan, please call the following number at least 2 days in advance of your appointment: [phone] ? For information about your ride after you set it up, call Ride Assist at [phone]. Use this number to activate a Will Call pickup, or if your transportation is late for a scheduled pickup. Use this number, too, if you need to make a change or cancel a previously scheduled reservation. ? If you need transportation services right away, call [phone]. The after-hours call center is staffed 24 hours to handle ride assistance and urgent reservation requests (including discharges) 365 days a year. Urgent trips include sick visits, hospital discharge requests and life-sustaining treatment. ? ?Call the [facility] at [phone], at any time, 24 hours a day, 7 days a week. If you are in danger or need immediate medical attention call 911. ? ?If you would like help to quit smoking, call 1-800-QUIT-NOW 563-579-1828) OR Espa?ol: 1-855-D?jelo-Ya (216)760-4895) o para m?s informaci?n haga clic aqu? or Text READY to 200-400 to register via text ? ?[name] - following are the goals we discussed in your visit today:  ? Goals  Addressed   ? ?  ?  ?  ?  ? This Visit's Progress  ?  RNCM - Chronic Back Pain Management     ?  Timeframe:  Long-Range Goal ?Priority:  High ?Start Date:   05/09/2021                          ?Expected End Date: 02/24/2022 ? ?       ?Patient Goals: ?Patient will self administer medications as prescribed ?Patient will attend all scheduled provider appointments ?Patient will call pharmacy for medication refills ?Patient will continue to perform ADL's independently ?Patient will continue to perform IADL's independently ?Patient will call provider office for new concerns or questions ?Patient will work with BSW to address care coordination needs and will continue to work with the clinical team to address health care and disease management related needs.                  ?  ?  RNCM - CML Disease Progression Minimized or Managed     ?  Timeframe:  Long-Range Goal ?Priority:  High ?Start Date:     05/09/2021                       ?Expected End Date:  02/24/2022 ? ?   ?Patient Goals: ?Patient will self administer medications as prescribed ?Patient will attend all scheduled provider appointments ?Patient will call pharmacy for medication refills ?Patient will continue to perform ADL's independently ?Patient will continue to perform IADL's independently ?Patient will call provider office for new concerns or questions ?Patient will work with BSW to  address care coordination needs and will continue to work with the clinical team to address health care and disease management related needs.                          ?  ?  RNCM - Glycemic Management & Monitored     ?  Timeframe:  Long-Range Goal ?Priority:  High ?Start Date:     05/09/2021                        ?Expected End Date:  02/24/2022 ? ?Patient Goals: ?Patient will self administer medications as prescribed ?Patient will attend all scheduled provider appointments ?Patient will call pharmacy for medication refills ?Patient will continue to perform ADL's independently ?Patient will  continue to perform IADL's independently ?Patient will call provider office for new concerns or questions ?Patient will work with BSW to address care coordination needs and will continue to work with the clinical team to address health care and disease management related needs.             ?  ?  RNCM - Hypertension Monitored and Managed     ?  Timeframe:  Long-Range Goal ?Priority:  High ?Start Date:  05/09/2021                           ?Expected End Date:  7/1//2023 ? ?Patient Goals ?Patient will self administer medications as prescribed ?Patient will attend all scheduled provider appointments ?Patient will call pharmacy for medication refills ?Patient will continue to perform ADL's independently ?Patient will continue to perform IADL's independently ?Patient will call provider office for new concerns or questions ?Patient will work with BSW to address care coordination needs and will continue to work with the clinical team to address health care and disease management related needs.               ?  ? ?  ? ? ?Please see education materials related to today's visit provided by MyChart link. ? ?Patient verbalizes understanding of instructions and care plan provided today and agrees to view in [place]. Active MyChart status confirmed with patient.   ? ?The Managed Medicaid care management team will reach out to the patient again over the next 30 days.  ? ?Gerald Olson, BSN ?Community Care Coordinator ?[facility] ?Mobile: [phone]  ? ?Following is a copy of your plan of care:  ?Care Plan : [place]  ?Updates made by [name], Olson since 12/20/2021 12:00 AM  ?  ? ?Problem: Chronic Disease Management and Care Coordination Needs for DM, HTN, CML and Back Pain   ?  ? ?Long-Range Goal: Plan of Care for Chronic Disease management and Care Coordination Needs (HTN, DM, CML, Chronic Back Pain)   ?Start Date: 05/09/2021  ?Expected End Date: 02/24/2022  ?Priority: High   ?Note:   ?Current Barriers:  ?Knowledge Deficits related to plan of care for management of HTN, DMII, and CML and Chronic Back Pain  ?Care Coordination needs related to Financial constraints related to paying back taxes on house and Limited social support  ?Chronic Disease Management support and education needs related to HTN, DMII, and CML and Chronic Back Pain ?Film/video editor.  ?Transportation barriers ?Non-adherence to prescribed medication regimen ?No Advanced Directives in place ? ?RNCM Clinical Goal(s):  ?Patient will verbalize understanding of plan for management  of HTN, DMII, and CML and Chronic Back Pain ?verbalize basic understanding of HTN, DMII, and CML and Chronic Back Pain disease process and self health management plan  ?take all medications exactly as prescribed and will call provider for medication related questions ?attend all scheduled medical appointments: 12/26/21 Dr Loanne [name]; 01/01/22 BSW visit; 01/16/22 PCP office visit. ?demonstrate ongoing adherence to prescribed treatment plan for HTN, DMII, and CML and Chronic Back Pain as evidenced by daily monitoring and recording of CBG, adherence to ADA/ carb modified diet, adherence to prescribed medication regimen, contacting provider for new or worsened symptoms or questions  ?demonstrate ongoing health management independence  ?continue to work with Olson Care Manager to address care management and care coordination needs related to HTN, DMII, and CML and Chronic Back Pain  ?work with pharmacist to address complex medication regimen  related to HTN, DMII, and CML and Chronic Back Pain ?work with Education officer, museum to address financial constraints related to overdue payment of back taxes and potential risk of losing home, Limited social support and counseling for anxiety/stress related to the management of HTN, DMII, and CML and Chronic Back Pain. ?collaborate with the care management team towards completion of advanced directives  through collaboration  with Olson Care manager, provider, and care team.  ? ?Interventions: ?Inter-disciplinary care team collaboration (see longitudinal plan of care) ?Evaluation of current treatment plan related to  self management and

## 2021-12-20 NOTE — Patient Outreach (Signed)
?Medicaid Managed Care   ?Nurse Care Manager Note ? ?12/20/2021 ?Name:  Gerald Olson MRN:  [id number] DOB:  1966-02-28 ? ?Gerald Olson is an 56 y.o. year old male who is a primary patient of Thedore Mins, Ria Comment, [place].  The [facility] Managed Care Coordination team was consulted for assistance with:    ?HTN ?DMII ?CML ?Chronic Neck and Back Pain ? ?Gerald Olson was given information about Medicaid Managed Care Coordination team services today. [name] Patient agreed to services and verbal consent obtained. ? ?Engaged with patient by telephone for follow up visit in response to provider referral for case management and/or care coordination services.  ? ?Assessments/Interventions:  Review of past medical history, allergies, medications, health status, including review of consultants reports, laboratory and other test data, was performed as part of comprehensive evaluation and provision of chronic care management services. ? ?SDOH (Social Determinants of Health) assessments and interventions performed: ? ? ?Care Plan ? ?No Known Allergies ? ?Medications Reviewed Today   ? ? Reviewed by [name], RN (Case Manager) on 12/20/21 at [place] List Status: <None>  ? ?Medication Order Taking? Sig Documenting Provider Last Dose Status Informant  ?acetaZOLAMIDE ER (DIAMOX) 500 MG capsule [id number] No Take 1 capsule (500 mg total) by mouth 2 (two) times daily  Taking Active   ?amoxicillin (AMOXIL) 500 MG capsule [id number] No Take 1 capsule by mouth every 8 hours until finished  ?Patient not taking: Reported on 12/19/2021  ?  Not Taking Active   ?chlorhexidine (PERIDEX) 0.12 % solution [id number] No Swish for one minute and spit twice daiy for 10 days as dircted.  ?Patient not taking: Reported on 12/19/2021  ?  Not Taking Active   ?Patient not taking:  Discontinued 08/09/20 1309 (Patient Preference)   ?ibuprofen (IBU) 800 MG tablet [id number] No Take 1 tablet every 8 hours as needed for pain  Taking Active   ?imatinib (GLEEVEC)  400 MG tablet [id number] No Take 1 tablet (400 mg total) by mouth daily. [name], RPH-CPP Taking Active   ?insulin glargine (LANTUS SOLOSTAR) 100 UNIT/ML Solostar Pen [id number] No Inject 5 Units into the skin every morning. [name], MD Taking Active   ?Insulin Pen Needle (UNIFINE PENTIPS) 32G X 4 MM MISC [id number] No Use to inject Victoza once a day [name], MD Taking Active   ?lisinopril (ZESTRIL) 10 MG tablet [id number] No Take 1 tablet (10 mg total) by mouth daily.  Taking Active   ?traMADol (ULTRAM) 50 MG tablet [id number] No TAKE 1 - 2 TABLETS BY MOUTH 3 TIMES DAILY AS NEEDED FOR SEVERE PAIN. MAY CAUSE DROWSINESS. Birdie Sons, MD Taking Active   ?Med List Note [name], RPH-CPP 07/28/20 1202): Gleevec (imatinib) filled at [facility]  ? ?  ?  ? ?  ? ? ?Patient Active Problem List  ? Diagnosis Date Noted  ? Other fatigue 09/20/2021  ? Spondylolisthesis at L5-S1 level 01/24/2021  ? Cervical radicular pain 10/26/2020  ? Lumbar facet arthropathy 10/26/2020  ? Lumbar degenerative disc disease 10/26/2020  ? Opiate misuse 10/26/2020  ? 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 Mesenteric lymphadenopathy 06/14/2020  ? Splenomegaly- mild  06/14/2020  ? History of cannabis abuse 06/14/2020  ? Recurrent major depressive disorder, in partial remission ([place]) 06/09/2020  ? Anxiety 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 06/09/2020  ? Alcohol use disorder, moderate, dependence ([place]) 06/09/2020  ? Frequent falls 05/23/2020  ? Foraminal  stenosis of cervical region 05/09/2020  ? Diabetic polyneuropathy   ? ADHD (attention deficit hyperactivity disorder)   ? Generalized anxiety disorder   ? Major depressive disorder   ? Cervical spondylosis 09/22/2019  ? Essential hypertension, benign 01/28/2019  ? Alcoholic cirrhosis of liver 06/02/2018  ? Type 2 diabetes mellitus with hyperglycemia 04/25/2018  ? ? ?Conditions to be addressed/monitored per PCP  order:  HTN, DMII, and CML and Chronic Neck & Back Pain ? ?Care Plan : RNCM Plan of Care  ?Updates made by [name], RN since 12/20/2021 12:00 AM  ?  ? ?Problem: Chronic Disease Management and Care Coordination Needs for DM, HTN, CML and Back Pain   ?  ? ?Long-Range Goal: Plan of Care for Chronic Disease management and Care Coordination Needs (HTN, DM, CML, Chronic Back Pain)   ?Start Date: 05/09/2021  ?Expected End Date: 02/24/2022  ?Priority: High  ?Note:   ?Current Barriers:  ?Knowledge Deficits related to plan of care for management of HTN, DMII, and CML and Chronic Back Pain  ?Care Coordination needs related to Financial constraints related to paying back taxes on house and Limited social support  ?Chronic Disease Management support and education needs related to HTN, DMII, and CML and Chronic Back Pain ?Film/video editor.  ?Transportation barriers ?Non-adherence to prescribed medication regimen ?No Advanced Directives in place ? ?RNCM Clinical Goal(s):  ?Patient will verbalize understanding of plan for management of HTN, DMII, and CML and Chronic Back Pain ?verbalize basic understanding of HTN, DMII, and CML and Chronic Back Pain disease process and self health management plan  ?take all medications exactly as prescribed and will call provider for medication related questions ?attend all scheduled medical appointments: 12/26/21 Dr Loanne [name]; 01/01/22 BSW visit; 01/16/22 PCP office visit. ?demonstrate ongoing adherence to prescribed treatment plan for HTN, DMII, and CML and Chronic Back Pain as evidenced by daily monitoring and recording of CBG, adherence to ADA/ carb modified diet, adherence to prescribed medication regimen, contacting provider for new or worsened symptoms or questions  ?demonstrate ongoing health management independence  ?continue to work with RN Care Manager to address care management and care coordination needs related to HTN, DMII, and CML and Chronic Back Pain  ?work with pharmacist  to address complex medication regimen  related to HTN, DMII, and CML and Chronic Back Pain ?work with Education officer, museum to address financial constraints related to overdue payment of back taxes and potential risk of losing home, Limited social support and counseling for anxiety/stress related to the management of HTN, DMII, and CML and Chronic Back Pain. ?collaborate with the care management team towards completion of advanced directives  through collaboration with RN Care manager, provider, and care team.  ? ?Interventions: ?Inter-disciplinary care team collaboration (see longitudinal plan of care) ?Evaluation of current treatment plan related to  self management and patient's adherence to plan as established by provider ? ? ?Diabetes:  (Status: Goal on Track (progressing): YES.) ?Lab Results  ?Component Value Date  ? HGBA1C 6.0 (A) 09/22/2021  ?Assessed patient's understanding of A1c goal: <7% ?Continue to provided education to patient about basic DM disease process; ?Reviewed medications with patient and discussed importance of medication adherence. Patient compliant with medications. ?Counseled on importance of regular laboratory monitoring as prescribed;        ?Discussed plans with patient for ongoing care management follow up and provided patient with direct contact information for care management team;      ?Reviewed scheduled/upcoming provider appointments including: See RNCM Clinical  Goals above.      ?Patient reports fasting blood sugars range 120 - 140; usually in 140 - 145.  Patient had one recent fasting blood sugar of 170 after having a Guinness beer the night before.   Patient reports no hypoglycemic or hyperglycemic episodes.       ?Call provider for findings outside established parameters.      ?Referral made to pharmacy team previously for assistance with complex medication regimen.  Patient doing well with medication compliance and his understanding of all medications.  Pharmacist referral not needed  at this time.       ?Review of patient status, including review of consultants reports, relevant laboratory and other test results, and medications completed.  ?Patient understands the importance of

## 2021-12-21 ENCOUNTER — Other Ambulatory Visit (HOSPITAL_COMMUNITY): Payer: Self-pay

## 2021-12-21 ENCOUNTER — Other Ambulatory Visit: Payer: Self-pay

## 2021-12-21 ENCOUNTER — Other Ambulatory Visit: Payer: Self-pay | Admitting: Physician Assistant

## 2021-12-21 DIAGNOSIS — I10 Essential (primary) hypertension: Secondary | ICD-10-CM

## 2021-12-21 DIAGNOSIS — Z981 Arthrodesis status: Secondary | ICD-10-CM

## 2021-12-21 MED ORDER — BLOOD PRESSURE MONITOR AUTOMAT DEVI: 0 refills | Status: DC

## 2021-12-21 MED ORDER — TRAMADOL HCL 50 MG PO TABS
50.0000 mg | ORAL_TABLET | Freq: Three times a day (TID) | ORAL | 0 refills | Status: DC | PRN
Start: 2021-12-25 — End: 2022-03-28
  Filled 2021-12-21: qty 9, 3d supply, fill #0
  Filled 2022-03-07: qty 9, 3d supply, fill #1

## 2021-12-26 ENCOUNTER — Other Ambulatory Visit: Payer: Self-pay

## 2021-12-26 ENCOUNTER — Encounter: Payer: Self-pay | Admitting: Endocrinology

## 2021-12-26 ENCOUNTER — Ambulatory Visit: Payer: Medicaid Other | Admitting: Endocrinology

## 2021-12-26 VITALS — BP 112/76 | HR 82 | Ht 74.0 in | Wt 228.8 lb

## 2021-12-26 DIAGNOSIS — E1165 Type 2 diabetes mellitus with hyperglycemia: Secondary | ICD-10-CM

## 2021-12-26 LAB — POCT GLYCOSYLATED HEMOGLOBIN (HGB A1C): Hemoglobin A1C: 6 % — AB (ref 4.0–5.6)

## 2021-12-26 MED ORDER — LANTUS SOLOSTAR 100 UNIT/ML ~~LOC~~ SOPN
3.0000 [IU] | PEN_INJECTOR | SUBCUTANEOUS | 0 refills | Status: DC
  Filled 2021-12-26 – 2022-01-04 (×2): qty 9, 84d supply, fill #0

## 2021-12-26 NOTE — Patient Instructions (Addendum)
check your blood sugar twice a day.  vary the time of day when you check, between before the 3 meals, and at bedtime.  also check if you have symptoms of your blood sugar being too high or too low.  please keep a record of the readings and bring it to your next appointment here (or you can bring the meter itself).  You can write it on any piece of paper.  please call us sooner if your blood sugar goes below 70, or if you have a lot of readings over 200.    ?Please reduce the Lantus to 3 units each morning.   ?You should have an endocrinology follow-up appointment in 3 months.   ?

## 2021-12-26 NOTE — Progress Notes (Signed)
? ?Subjective:  ? ? Patient ID: Gerald Olson, male    DOB: 1966-02-07, 56 y.o.   MRN: [id number] ? ?HPI ?Pt returns for f/u of diabetes mellitus:  ?DM type: 2 ?Dx'ed: 2006 (he lost 70 lbs, so normoglycemia returned until 2019).   ?Complications: PPN, PAD, and DR.   ?Therapy: insulin since 2020. ?DKA: never ?Severe hypoglycemia: never ?Pancreatitis: once (2021), on Victoza.   ?Pancreatic imaging: normal on 2021 CT ?SDOH: medicaid pays for lantus.  ?Other: He declines multiple daily injections; he declined to continue [place];  He stopped metformin, due to lightheadedness.   ?Interval history: He brings his meter with his cbg's which I have reviewed today.  cbg's vary from 92-194.  Main symptom is fatigue.  He takes 7 units qd.   ?Past Medical History:  ?Diagnosis Date  ? ADHD (attention deficit hyperactivity disorder)   ? Diagnosed during teenage years  ? Alcohol use disorder   ? On average 14-21 drinks per week  ? Alcoholic cirrhosis of liver   ? Per patient - condition is not related to moderate alcohol use but rather prolonged NSAID use and recent abuse.  ? Allergy   ? Calculus of gallbladder without cholecystitis without obstruction   ? Cancer [facility])   ? leukemia  ? Chronic pain   ? Neck/lower back  ? Degenerative disc disease, lumbar   ? Diabetic polyneuropathy   ? Essential hypertension, benign   ? Generalized anxiety disorder   ? Glaucoma   ? Per patient report  ? Headache   ? History of concussion 2006  ? 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 Major depressive disorder   ? Other spondylosis with radiculopathy, cervical region   ? Retinopathy   ? Right eye; per patient report  ? Type 2 diabetes mellitus with hyperglycemia   ? ? ?Past Surgical History:  ?Procedure Laterality Date  ? CHOLECYSTECTOMY N/A 06/02/2018  ? Procedure: LAPAROSCOPIC CHOLECYSTECTOMY;  Surgeon: Vickie Epley, MD;  Location: [facility] ORS;  Service: General;  Laterality: N/A;  ? KNEE ARTHROSCOPY    ? 30 Years ago  ?  lumbar five-sacral one posterior lumbar interbody fusion  01/24/2021  ? ? ?Social History  ? ?Socioeconomic History  ? Marital status: Single  ?  Spouse name: Not on file  ? Number of children: Not on file  ? Years of education: 46  ? Highest education level: Master's degree (e.g., MA, MS, MEng, MEd, MSW, MBA)  ?Occupational History  ? Not on file  ?Tobacco Use  ? Smoking status: Former  ?  Types: Cigarettes  ?  Quit date: 06/25/2003  ?  Years since quitting: 18.5  ? Smokeless tobacco: Never  ? Tobacco comments:  ?  Currently vapes  ?Vaping Use  ? Vaping Use: Every day  ?Substance and Sexual Activity  ? Alcohol use: Yes  ?  Alcohol/week: 14.0 - 21.0 standard drinks  ?  Types: 14 - 21 Cans of beer per week  ?  Comment: 2-3 beers per night on average; sometimes up to 6  ? Drug use: Yes  ?  Types: Marijuana  ?  Comment: 1/week to help sleep; edibles, does not smoke  ? Sexual activity: Yes  ?Other Topics Concern  ? Not on file  ?Social History Narrative  ? Not on file  ? ?Social Determinants of Health  ? ?Financial Resource Strain: High Risk  ? Difficulty of Paying Living Expenses: Hard  ?Food Insecurity: No Food Insecurity  ?  Worried About Charity fundraiser in the Last Year: Never true  ? Ran Out of Food in the Last Year: Never true  ?Transportation Needs: Unmet Transportation Needs  ? Lack of Transportation (Medical): Yes  ? Lack of Transportation (Non-Medical): Yes  ?Physical Activity: Insufficiently Active  ? Days of Exercise per Week: 3 days  ? Minutes of Exercise per Session: 10 min  ?Stress: Stress Concern Present  ? Feeling of Stress : Very much  ?Social Connections: Socially Isolated  ? Frequency of Communication with Friends and Family: More than three times a week  ? Frequency of Social Gatherings with Friends and Family: More than three times a week  ? Attends Religious Services: Never  ? Active Member of Clubs or Organizations: No  ? Attends Archivist Meetings: Never  ? Marital Status: Never  married  ?Intimate Partner Violence: Not on file  ? ? ?Current Outpatient Medications on File Prior to Visit  ?Medication Sig Dispense Refill  ? acetaZOLAMIDE ER (DIAMOX) 500 MG capsule Take 1 capsule (500 mg total) by mouth 2 (two) times daily 60 capsule 2  ? amoxicillin (AMOXIL) 500 MG capsule Take 1 capsule by mouth every 8 hours until finished 21 capsule 0  ? Blood Pressure Monitoring (BLOOD PRESSURE MONITOR AUTOMAT) DEVI Check blood pressure in AM and PM daily 1 each 0  ? chlorhexidine (PERIDEX) 0.12 % solution Swish for one minute and spit twice daiy for 10 days as dircted. 473 mL 0  ? ibuprofen (IBU) 800 MG tablet Take 1 tablet every 8 hours as needed for pain 20 tablet 0  ? imatinib (GLEEVEC) 400 MG tablet Take 1 tablet (400 mg total) by mouth daily. 30 tablet 5  ? Insulin Pen Needle (UNIFINE PENTIPS) 32G X 4 MM MISC Use to inject Victoza once a day 100 each 11  ? lisinopril (ZESTRIL) 10 MG tablet Take 1 tablet (10 mg total) by mouth daily. 90 tablet 3  ? traMADol (ULTRAM) 50 MG tablet Take 1 tablet (50 mg total) by mouth 3 (three) times daily as needed for severe pain. 30 tablet 0  ? [DISCONTINUED] DULoxetine (CYMBALTA) 60 MG capsule Take 1 capsule (60 mg total) by mouth 2 (two) times daily. (Patient not taking: No sig reported) 60 capsule 1  ? ?No current facility-administered medications on file prior to visit.  ? ? ?No Known Allergies ? ?Family History  ?Problem Relation Age of Onset  ? Aneurysm Father   ? Drug abuse Sister   ? Multiple sclerosis Sister   ? Suicidality Maternal Aunt   ? Emphysema Maternal Grandfather   ? Diabetes Neg Hx   ? ? ?BP 112/76 (BP Location: Left Arm, Patient Position: Sitting, Cuff Size: Normal)   Pulse 82   Ht '6\' 2"'$  (1.88 m)   Wt 228 lb 12.8 oz (103.8 kg)   SpO2 97%   BMI 29.38 kg/m?  ? ? ?Review of Systems ?He denies hypoglycemia.   ?   ?Objective:  ? Physical Exam ?VITAL SIGNS:  See vs page.   ?GENERAL: no distress.   ? ?Lab Results  ?Component Value Date  ? CREATININE  1.02 12/13/2021  ? BUN 23 (H) 12/13/2021  ? NA 134 (L) 12/13/2021  ? K 4.2 12/13/2021  ? CL 104 12/13/2021  ? CO2 25 12/13/2021  ? ? ?Lab Results  ?Component Value Date  ? HGBA1C 6.0 (A) 09/22/2021  ? ?   ?Assessment & Plan:  ?Type 2 DM: overcontrolled. We discussed.  He is hesitant to d/c insulin, so we'll reduce further.   ? ?Patient Instructions  ?check your blood sugar twice a day.  vary the time of day when you check, between before the 3 meals, and at bedtime.  also check if you have symptoms of your blood sugar being too high or too low.  please keep a record of the readings and bring it to your next appointment here (or you can bring the meter itself).  You can write it on any piece of paper.  please call us sooner if your blood sugar goes below 70, or if you have a lot of readings over 200.    ?Please reduce the Lantus to 3 units each morning.   ?You should have an endocrinology follow-up appointment in 3 months.   ? ? ?

## 2021-12-27 DIAGNOSIS — I10 Essential (primary) hypertension: Secondary | ICD-10-CM | POA: Diagnosis not present

## 2022-01-01 ENCOUNTER — Other Ambulatory Visit (HOSPITAL_COMMUNITY): Payer: Self-pay

## 2022-01-01 ENCOUNTER — Other Ambulatory Visit: Payer: Self-pay

## 2022-01-01 NOTE — Patient Outreach (Signed)
?Medicaid Managed Care ?Social Work Note ? ?01/01/2022 ?Name:  Gerald Olson MRN:  [id number] DOB:  [birth year] ? ?Gerald Olson is an 56 y.o. year old male who is a primary patient of Thedore Mins, Ria Comment, [place].  The [facility] Managed Care Coordination team was consulted for assistance with:  Community Resources  ? ?Gerald Olson was given information about Medicaid Managed Care Coordination team services today. Gerald Olson Patient agreed to services and verbal consent obtained. ? ?Engaged with patient  for by telephone forfollow up visit in response to referral for case management and/or care coordination services.  ? ?Assessments/Interventions:  Review of past medical history, allergies, medications, health status, including review of consultants reports, laboratory and other test data, was performed as part of comprehensive evaluation and provision of chronic care management services. ? ?SDOH: (Social Determinant of Health) assessments and interventions performed: ?BSW completed telephone outreach with patient. He stated he has been feeling a little down, one of his good friends past away yesterday. Patient stated he would like a new tax paper to complete since he can not find his old one. Patient states he does not want to go down to the office. BSW  checked to see if information can be mailed to him, He can contact them to mail the application or he can apply online. No other resources are needed at this time.  ? ?Advanced Directives Status:  Not addressed in this encounter. ? ?Care Plan ?                ?No Known Allergies ? ?Medications Reviewed Today   ? ? Reviewed by Sarina Ill, CMA (Certified Medical Assistant) on 12/26/21 at 1518  Med List Status: <None>  ? ?Medication Order Taking? Sig Documenting Provider Last Dose Status Informant  ?acetaZOLAMIDE ER (DIAMOX) 500 MG capsule [id number] Yes Take 1 capsule (500 mg total) by mouth 2 (two) times daily  Taking Active   ?amoxicillin (AMOXIL) 500 MG capsule [id number]  Yes Take 1 capsule by mouth every 8 hours until finished  Taking Active   ?Blood Pressure Monitoring (BLOOD PRESSURE MONITOR AUTOMAT) DEVI [id number] Yes Check blood pressure in AM and PM daily Drubel, Ria Comment, PA-C Taking Active   ?chlorhexidine (PERIDEX) 0.12 % solution [id number] Yes Swish for one minute and spit twice daiy for 10 days as dircted.  Taking Active   ? Patient not taking:   Discontinued 08/09/20 1309 (Patient Preference)   ?ibuprofen (IBU) 800 MG tablet [id number] Yes Take 1 tablet every 8 hours as needed for pain  Taking Active   ?imatinib (GLEEVEC) 400 MG tablet [id number] Yes Take 1 tablet (400 mg total) by mouth daily. [name], RPH-CPP Taking Active   ?insulin glargine (LANTUS SOLOSTAR) 100 UNIT/ML Solostar Pen [id number] Yes Inject 5 Units into the skin every morning. [name], MD Taking Active   ?Insulin Pen Needle (UNIFINE PENTIPS) 32G X 4 MM MISC [id number] Yes Use to inject Victoza once a day [name], MD Taking Active   ?lisinopril (ZESTRIL) 10 MG tablet [id number] Yes Take 1 tablet (10 mg total) by mouth daily.  Taking Active   ?traMADol (ULTRAM) 50 MG tablet [id number] Yes Take 1 tablet (50 mg total) by mouth 3 (three) times daily as needed for severe pain. [name], PA-C Taking Active   ?Med List Note [name], RPH-CPP 07/28/20 1202): Gleevec (imatinib) filled at [facility]  ? ?  ?  ? ?  ? ? ?Patient Active Problem List  ?  Diagnosis Date Noted  ? Other fatigue 09/20/2021  ? Spondylolisthesis at L5-S1 level 01/24/2021  ? Cervical radicular pain 10/26/2020  ? Lumbar facet arthropathy 10/26/2020  ? Lumbar degenerative disc disease 10/26/2020  ? Opiate misuse 10/26/2020  ? 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 Mesenteric lymphadenopathy 06/14/2020  ? Splenomegaly- mild  06/14/2020  ? History of cannabis abuse 06/14/2020  ? Recurrent major depressive disorder, in partial remission ([place]) 06/09/2020  ?  Anxiety 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 06/09/2020  ? Alcohol use disorder, moderate, dependence ([place]) 06/09/2020  ? Frequent falls 05/23/2020  ? Foraminal stenosis of cervical region 05/09/2020  ? Diabetic polyneuropathy   ? ADHD (attention deficit hyperactivity disorder)   ? Generalized anxiety disorder   ? Major depressive disorder   ? Cervical spondylosis 09/22/2019  ? Essential hypertension, benign 01/28/2019  ? Alcoholic cirrhosis of liver 06/02/2018  ? Type 2 diabetes mellitus with hyperglycemia 04/25/2018  ? ? ?Conditions to be addressed/monitored per PCP order:   tax program ? ?There are no care plans that you recently modified to display for this patient. ? ? ?Follow up:  Patient agrees to Care Plan and Follow-up. ? ?Plan: The Managed Medicaid care management team will reach out to the patient again over the next 30-45 days. ? ?Date/time of next scheduled Social Work care management/care coordination outreach:  02/14/22 ? ?[name], BSW, [place] ?[place]  ?High Risk Managed Medicaid Team  ?[phone]  ?

## 2022-01-01 NOTE — Patient Instructions (Signed)
Visit Information ? ?Mr. Saindon was given information about Medicaid Managed Care team care coordination services as a part of their Healthy [facility] Medicaid benefit. [name] verbally consented to engagement with the [facility] Managed Care team.  ? ?If you are experiencing a medical emergency, please call 911 or report to your local emergency department or urgent care.  ? ?If you have a non-emergency medical problem during routine business hours, please contact your provider's office and ask to speak with a nurse.  ? ?For questions related to your Healthy [facility] health plan, please call: [phone] or visit the homepage here: [email or web address] ? ?If you would like to schedule transportation through your Healthy [facility] plan, please call the following number at least 2 days in advance of your appointment: [phone] ? For information about your ride after you set it up, call Ride Assist at [phone]. Use this number to activate a Will Call pickup, or if your transportation is late for a scheduled pickup. Use this number, too, if you need to make a change or cancel a previously scheduled reservation. ? If you need transportation services right away, call [phone]. The after-hours call center is staffed 24 hours to handle ride assistance and urgent reservation requests (including discharges) 365 days a year. Urgent trips include sick visits, hospital discharge requests and life-sustaining treatment. ? ?Call the [place] at [phone], at any time, 24 hours a day, 7 days a week. If you are in danger or need immediate medical attention call 911. ? ?If you would like help to quit smoking, call 1-800-QUIT-NOW 639 088 6097) OR Espa?ol: 1-855-D?jelo-Ya (623)865-1577) o para m?s informaci?n haga clic aqu? or Text READY to 200-400 to register via text ? ?Mr. Allocca - following are the goals we discussed in your visit today:  ? Goals  Addressed   ?None ?  ? ? ?Social Worker will follow up in 30-45 days .  ? ?[name], BSW, [place] ?[place]  ?High Risk Managed Medicaid Team  ?[phone]  ? ?Following is a copy of your plan of care:  ?There are no care plans that you recently modified to display for this patient. ? ?  ?

## 2022-01-04 ENCOUNTER — Other Ambulatory Visit: Payer: Self-pay

## 2022-01-15 NOTE — Progress Notes (Unsigned)
I,Sha'taria Tyson,acting as a Education administrator for [employer], PA-C.,have documented all relevant documentation on the behalf of Gerald Kirschner, PA-C,as directed by  Gerald Kirschner, PA-C while in the presence of Gerald Kirschner, PA-C.   Established patient visit   Patient: Gerald Olson   DOB: 1965-11-10   56 y.o. Male  MRN: [id number] Visit Date: 01/16/2022  Today's healthcare provider: [name], PA-C  Cc. HTN f/u  Subjective    HPI   Hypertension, follow-up  BP Readings from Last 3 Encounters:  01/16/22 115/80  12/26/21 112/76  09/22/21 (!) 144/90   Wt Readings from Last 3 Encounters:  01/16/22 226 lb 3.2 oz (102.6 kg)  12/26/21 228 lb 12.8 oz (103.8 kg)  09/22/21 221 lb 9.6 oz (100.5 kg)     He was last seen for hypertension 4 months ago.  BP at that visit was 107/84. Management since that visit includes continue lisinopril and f/u with nephrologist.  He reports excellent compliance with treatment. He is having side effects.  He is following a Low Sodium, Diabetic diet. He is not exercising. He does not smoke. He does vape.  Use of agents associated with hypertension: none.   Outside blood pressures are : 115/80 when not in pain.  Symptoms: No chest pain No chest pressure  No palpitations No syncope  No dyspnea No orthopnea  No paroxysmal nocturnal dyspnea Yes lower extremity edema (left foot)   Pertinent labs Lab Results  Component Value Date   CHOL 122 06/14/2020   HDL 34 (L) 06/14/2020   LDLCALC 72 06/14/2020   TRIG 77 06/14/2020   CHOLHDL 3.6 06/14/2020   Lab Results  Component Value Date   NA 134 (L) 12/13/2021   K 4.2 12/13/2021   CREATININE 1.02 12/13/2021   GFRNONAA >60 12/13/2021   GLUCOSE 164 (H) 12/13/2021   TSH 3.970 06/27/2020     The ASCVD Risk score (Arnett DK, et al., 2019) failed to calculate for the following reasons:   The valid total cholesterol range is 130 to 320  mg/dL  ---------------------------------------------------------------------------------------------------   Medications: Outpatient Medications Prior to Visit  Medication Sig   acetaZOLAMIDE ER (DIAMOX) 500 MG capsule Take 1 capsule (500 mg total) by mouth 2 (two) times daily   Blood Pressure Monitoring (BLOOD PRESSURE MONITOR AUTOMAT) DEVI Check blood pressure in AM and PM daily   ibuprofen (IBU) 800 MG tablet Take 1 tablet every 8 hours as needed for pain   imatinib (GLEEVEC) 400 MG tablet Take 1 tablet (400 mg total) by mouth daily.   insulin glargine (LANTUS SOLOSTAR) 100 UNIT/ML Solostar Pen Inject 3 Units into the skin every morning.   Insulin Pen Needle (UNIFINE PENTIPS) 32G X 4 MM MISC Use to inject Victoza once a day   lisinopril (ZESTRIL) 10 MG tablet Take 1 tablet (10 mg total) by mouth daily.   traMADol (ULTRAM) 50 MG tablet Take 1 tablet (50 mg total) by mouth 3 (three) times daily as needed for severe pain.   [DISCONTINUED] amoxicillin (AMOXIL) 500 MG capsule Take 1 capsule by mouth every 8 hours until finished (Patient not taking: Reported on 01/16/2022)   [DISCONTINUED] chlorhexidine (PERIDEX) 0.12 % solution Swish for one minute and spit twice daiy for 10 days as dircted. (Patient not taking: Reported on 01/16/2022)   No facility-administered medications prior to visit.    Review of Systems  Musculoskeletal:  Positive for back pain.      Objective    Blood pressure 115/80, pulse  83, height '6\' 2"'$  (1.88 m), weight 226 lb 3.2 oz (102.6 kg), SpO2 99 %.    Physical Exam Constitutional:      General: He is awake.     Appearance: He is well-developed.  HENT:     Head: Normocephalic.  Eyes:     Conjunctiva/sclera: Conjunctivae normal.  Cardiovascular:     Rate and Rhythm: Normal rate and regular rhythm.     Heart sounds: Normal heart sounds.  Pulmonary:     Effort: Pulmonary effort is normal.     Breath sounds: Normal breath sounds.  Skin:    General: Skin is  warm.  Neurological:     Mental Status: He is alert and oriented to person, place, and time.  Psychiatric:        Attention and Perception: Attention normal.        Mood and Affect: Mood normal.        Speech: Speech normal.        Behavior: Behavior is cooperative.     Assessment & Plan     Problem List Items Addressed This Visit       Cardiovascular and Mediastinum   Essential hypertension, benign    Elevated in office but pt in significant pain today Normal at home, last endo visit was 112/76 in office         Endocrine   Type 2 diabetes mellitus with hyperglycemia - Primary    Managed by Endo, but cannot find uacr on record within the year Will obtain uacr  Last a1c 6.0%       Relevant Orders   POCT HgB A1C (Completed)   Urine Microalbumin w/creat. ratio     Return in about 6 months (around 07/19/2022) for hypertension.      I, Gerald Kirschner, PA-C have reviewed all documentation for this visit. The documentation on  01/16/2022 for the exam, diagnosis, procedures, and orders are all accurate and complete.  Gerald Kirschner, PA-C [facility] [address] [place], [place], [address] Office: [phone] Fax: [place]

## 2022-01-16 ENCOUNTER — Ambulatory Visit: Payer: Medicaid Other | Admitting: Physician Assistant

## 2022-01-16 ENCOUNTER — Encounter: Payer: Self-pay | Admitting: Physician Assistant

## 2022-01-16 VITALS — BP 115/80 | HR 83 | Ht 74.0 in | Wt 226.2 lb

## 2022-01-16 DIAGNOSIS — E1165 Type 2 diabetes mellitus with hyperglycemia: Secondary | ICD-10-CM

## 2022-01-16 DIAGNOSIS — I10 Essential (primary) hypertension: Secondary | ICD-10-CM

## 2022-01-16 LAB — POCT GLYCOSYLATED HEMOGLOBIN (HGB A1C): Hemoglobin A1C: 6.1 % — AB (ref 4.0–5.6)

## 2022-01-16 NOTE — Assessment & Plan Note (Signed)
Managed by Endo, but cannot find uacr on record within the year Will obtain uacr  Last a1c 6.0%

## 2022-01-16 NOTE — Assessment & Plan Note (Signed)
>>  ASSESSMENT AND PLAN FOR ESSENTIAL HYPERTENSION, BENIGN WRITTEN ON 01/16/2022  3:49 PM BY DRUBEL, LINDSAY, PA-C  Elevated in office but pt in significant pain today Normal at home, last endo visit was 112/76 in office

## 2022-01-16 NOTE — Assessment & Plan Note (Signed)
Elevated in office but pt in significant pain today Normal at home, last endo visit was 112/76 in office

## 2022-01-17 ENCOUNTER — Other Ambulatory Visit (HOSPITAL_COMMUNITY): Payer: Self-pay

## 2022-01-17 ENCOUNTER — Other Ambulatory Visit: Payer: Self-pay

## 2022-01-17 ENCOUNTER — Other Ambulatory Visit: Payer: Self-pay | Admitting: Endocrinology

## 2022-01-17 DIAGNOSIS — E1165 Type 2 diabetes mellitus with hyperglycemia: Secondary | ICD-10-CM

## 2022-01-17 LAB — MICROALBUMIN / CREATININE URINE RATIO
Creatinine, Urine: 128.7 mg/dL
Microalb/Creat Ratio: 4 mg/g creat (ref 0–29)
Microalbumin, Urine: 5.5 ug/mL

## 2022-01-17 NOTE — Patient Outreach (Signed)
Medicaid Managed Care   Nurse Care Manager Note  01/17/2022 Name:  Gerald Olson MRN:  [id number] DOB:  [birth year]  Gerald Olson is an 56 y.o. year old male who is a primary patient of Thedore Mins, Ria Comment, [place].  [facility] Managed Care Coordination team was consulted for assistance with:    HTN DMII CML Chronic Back Pain  Gerald Olson was given information about Medicaid Managed Care Coordination team services today. Gerald Olson Patient agreed to services and verbal consent obtained.  Engaged with patient by telephone for follow up visit in response to provider referral for case management and/or care coordination services.   Assessments/Interventions:  Review of past medical history, allergies, medications, health status, including review of consultants reports, laboratory and other test data, was performed as part of comprehensive evaluation and provision of chronic care management services.  SDOH (Social Determinants of Health) assessments and interventions performed:   Care Plan  No Known Allergies  Medications Reviewed Today     Reviewed by [name], RN (Case Manager) on 01/17/22 at [place] List Status: <None>   Medication Order Taking? Sig Documenting Provider Last Dose Status Informant  acetaZOLAMIDE ER (DIAMOX) 500 MG capsule [id number] No Take 1 capsule (500 mg total) by mouth 2 (two) times daily  Taking Active   Blood Pressure Monitoring (BLOOD PRESSURE MONITOR AUTOMAT) DEVI [id number] No Check blood pressure in AM and PM daily [name], PA-C Taking Active   Patient not taking:  Discontinued 08/09/20 1309 (Patient Preference)   ibuprofen (IBU) 800 MG tablet [id number] No Take 1 tablet every 8 hours as needed for pain  Taking Active   imatinib (GLEEVEC) 400 MG tablet [id number] No Take 1 tablet (400 mg total) by mouth daily. [name], RPH-CPP Taking Active   insulin glargine (LANTUS SOLOSTAR) 100 UNIT/ML Solostar Pen [id number] No Inject 3 Units into  the skin every morning. [name], MD Taking Active   Insulin Pen Needle (UNIFINE PENTIPS) 32G X 4 MM MISC [id number] No Use to inject Victoza once a day [name], MD Taking Active   lisinopril (ZESTRIL) 10 MG tablet [id number] No Take 1 tablet (10 mg total) by mouth daily.  Taking Active   traMADol (ULTRAM) 50 MG tablet [id number] No Take 1 tablet (50 mg total) by mouth 3 (three) times daily as needed for severe pain. 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 08/09/2020   Mesenteric lymphadenopathy 06/14/2020   Splenomegaly- mild  06/14/2020   History of cannabis abuse 06/14/2020   Recurrent major depressive disorder, in partial remission ([place]) 06/09/2020   Anxiety 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 06/09/2020   Alcohol use disorder, moderate, dependence ([place]) 06/09/2020   Frequent falls 05/23/2020   Foraminal stenosis of cervical region 05/09/2020   Diabetic polyneuropathy    ADHD (attention deficit hyperactivity disorder)    Generalized anxiety disorder    Major depressive disorder    Cervical spondylosis 09/22/2019   Essential hypertension, benign 62/94/7654   Alcoholic cirrhosis of  liver 06/02/2018   Type 2 diabetes mellitus with hyperglycemia 04/25/2018    Conditions to be addressed/monitored per PCP order:  HTN, DMII, and CML and Chronic Back Pain  Care Plan : RNCM Plan of Care  Updates made by [name], RN since 01/17/2022 12:00 AM     Problem: Chronic Disease Management and Care Coordination  Needs for DM, HTN, CML and Back Pain      Long-Range Goal: Plan of Care for Chronic Disease management and Care Coordination Needs (HTN, DM, CML, Chronic Back Pain)   Start Date: 05/09/2021  Expected End Date: 03/27/2022  Priority: High  Note:   Current Barriers:  Knowledge Deficits related to plan of care for management of HTN, DMII, and CML and Chronic Back Pain  Care Coordination needs related to Financial constraints related to paying back taxes on house and Limited social support  Chronic Disease Management support and education needs related to HTN, DMII, and CML and Chronic Back Pain Financial Constraints.  Transportation barriers Non-adherence to prescribed medication regimen No Advanced Directives in place  RNCM Clinical Goal(s):  Patient will verbalize understanding of plan for management of HTN, DMII, and CML and Chronic Back Pain verbalize basic understanding of HTN, DMII, and CML and Chronic Back Pain disease process and self health management plan  take all medications exactly as prescribed and will call provider for medication related questions attend all scheduled medical appointments: 02/14/2022 Dr. Ellene Route (Neurosurgeon); 03/13/2022 Neurologist. demonstrate ongoing adherence to prescribed treatment plan for HTN, DMII, and CML and Chronic Back Pain as evidenced by daily monitoring and recording of CBG, adherence to ADA/ carb modified diet, adherence to prescribed medication regimen, contacting provider for new or worsened symptoms or questions  demonstrate ongoing health management independence  continue to work with RN Care Manager to address care management and care coordination needs related to HTN, DMII, and CML and Chronic Back Pain  work with pharmacist to address complex medication regimen  related to HTN, DMII, and CML and Chronic Back Pain work with Education officer, museum to address financial constraints related to overdue payment of back taxes and potential risk of losing home,  Limited social support and counseling for anxiety/stress related to the management of HTN, DMII, and CML and Chronic Back Pain. collaborate with the care management team towards completion of advanced directives  through collaboration with RN Care manager, provider, and care team.   Interventions: Inter-disciplinary care team collaboration (see longitudinal plan of care) Evaluation of current treatment plan related to  self management and patient's adherence to plan as established by provider   Diabetes:  (Status: Goal on Track (progressing): YES.) Lab Results  Component Value Date   HGBA1C 6.1 (A) 01/16/2022  Assessed patient's understanding of A1c goal: <6.5% Continue to provided education to patient about basic DM disease process; Reviewed medications with patient and discussed importance of medication adherence. Patient compliant with medications.  Noted decrease in Lantus insulin to 3 units on 12/26/2021 at Dr. Cordelia Pen office visit. Counseled on importance of regular laboratory monitoring as prescribed;        Discussed plans with patient for ongoing care management follow up and provided patient with direct contact information for care management team;      Reviewed scheduled/upcoming provider appointments including: See RNCM Clinical Goals above.      Patient reports checking his blood sugar twice a day using his Accu-check Guide glucometer.  Blood sugar range during the first half of the month: 105 -130 and  last half of the month: 140 - 180.  Reports blood sugar range of 160 - 180 during the day.  The highest reading being 182. The noted difference in blood sugar range during the early part of the month vs the latter part of the month is related to the food choices he has to eat because of decreased food stamp amount towards the end of the month.  At the end of the month the patient only has enough money for canned foods, pasta & processed foods.  Patient informed this RN Care Manager that  he has a noticeable increase in blood sugar readings when he eats gluten food items, such as pasta and breads vs straight carbohydrates.  Patient reports no hypoglycemic or hyperglycemic episodes.       Call provider for findings outside established parameters.      Referral made to pharmacy team previously for assistance with complex medication regimen.  Patient doing well with medication compliance and his understanding of all medications.  Pharmacist referral not needed at this time.       Review of patient status, including review of consultants reports, relevant laboratory and other test results, and medications completed.  Patient understands the importance of drinking low sugar drinks and use of sugar substitutes.        Chronic Myeloid Leukemia (CML)  (Status: Goal on Track (progressing): YES.) Evaluation of current treatment plan related to  CML and patient's adherence to plan as established by provider.  Patient continues to be in remission and the plan is for patient to continue on Gleevec 400 mg daily.  Patient continues to take the [place] with some protein to avoid having diarrhea.  Discussed plans with patient for ongoing care management follow up and provided patient with direct contact information for care management team Reviewed medications with patient and discussed the importance of taking all medications as prescribed; Pharmacy referral for complex medication regimen made previously.  [facility] Pharmacist not needed at this time. Patient did mention how Medicaid Insurance denied payment for recent blood work related to [facility].  Patient is considering appealing this denial.   Hypertension: (Status: Goal on Track (progressing): YES.) Last practice recorded Blood Pressure readings: BP Readings from Last 3 Encounters:  01/16/22 115/80  12/26/21 112/76  09/22/21 (!) 144/90    Most recent eGFR/CrCl: No results found for: EGFR  No components found for: CRCL  Evaluation of current  treatment plan related to hypertension self management and patient's adherence to plan as established by provider;   Reviewed medications with patient and discussed importance of compliance.  Discussed plans with patient for ongoing care management follow up and provided patient with direct contact information for care management team; Discussed patient's recent blood pressure readings at recent medical office visits.  Reviewed scheduled/upcoming provider appointments including: See RNCM Clinical Goals above. Patient did obtain a blood pressure monitor through [facility] this past month.  Patient report most recent blood pressure reading at home: 138/87 with heart rate 93.  Continued to re-enforce education on Orthostatic blood pressure changes that occur normally when changing positions from lying to sitting to standing.  Patient continues to report he does experience some lightheaded with moving from a sitting to a standing position which only last a few seconds.  Patient checks his blood pressure and heart rate at time of episode with blood pressure 125/84 and heart rate elevated.  Patient suspects caffeine may be making heart rate elevated. Instructed patient to change positions slowly and  continue to monitor. Patient verbalized understanding.   Pain:  (Status: Goal on Track (progressing): YES.) Pain assessment performed.  Patient reports chronic neck & back pain.  Patient's back pain intensified starting 5 days ago when overdoing activities plus worsening the pain today by bending over to help move a box spring mattress. Today, he rates back pain while sitting a '6' and '8 - 10" with movement.  Pain rating is on a Pain Scale of 0 - 10. Currently using Ibuprofen 600 mg PRN during the day and Tramadol at night with fair relief - able to sleep only 2 hours at a stretch.  Patient took tramadol 1 tab approximately every 2 hours (3 tablets within 6 hours) last night with fair relief.  Pain interferes  with sleep due to staying in same position for 2 hours.  Per patient, his pain is either triggered by staying in same position for a prolonged period of time or by moving too much. Medications reviewed.  Reviewed provider established plan for pain management; Discussed importance of adherence to all scheduled medical appointments; See [facility] Clinical Goals above.  Has appointment with Dr. Ellene Route on 02/14/2022. 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  Patient did have a fall approximately 7 weeks ago due to the Neuropathy. He fell backwards in laundry room but was able to land on his right side resulting in bruising on right hip and entire right side; no bones broken; no cuts or bleeding. Patient reports a recent fall in Living Room caused by left leg neuropathy.  Injuries occurred: bruising and small cuts on arm.  Patient also reports noted swelling in left leg from knee down to foot & ankle and is using compression socks which seem to help relieve swelling.  Will continue to monitor.      Patient Goals/Self-Care Activities: Patient will self administer medications as prescribed as evidenced by self report/primary caregiver report  Patient will attend all scheduled provider appointments as evidenced by clinician review of documented attendance to scheduled appointments and patient/caregiver report Patient will call pharmacy for medication refills as evidenced by patient report and review of pharmacy fill history as appropriate Patient will continue to perform ADL's independently as evidenced by patient/caregiver report Patient will continue to perform IADL's independently as evidenced by patient/caregiver report Patient will call provider office for new concerns or questions as evidenced by review of documented incoming telephone call notes and patient report Patient will work with BSW to address care  coordination needs and will continue to work with the clinical team to address health care and disease management related needs as evidenced by documented adherence to scheduled care management/care coordination appointments - check blood sugar at prescribed times: twice daily - enter blood sugar readings and medication or insulin into daily log - take the blood sugar log to all doctor visits - check blood pressure weekly - write blood pressure results in a log or diary - keep a blood pressure log - take blood pressure log to all doctor appointments - call doctor for signs and symptoms of high blood pressure - keep all doctor appointments - take medications for blood pressure exactly as prescribed       Follow Up:  Patient agrees to Care Plan and Follow-up.  Plan: The Managed Medicaid care management team will reach out to the patient again over the next 40 days.  Date/time of next scheduled  RN care management/care coordination outreach:  February 21, 2022 at 3:00 PM  [place], [facility] Mobile: [phone]

## 2022-01-17 NOTE — Patient Instructions (Signed)
Visit Information  Gerald Olson was given information about Medicaid Managed Care team care coordination services as a part of their Healthy [facility] Medicaid benefit. Gerald Olson verbally consented to engagement with the [facility] Managed Care team.   If you are experiencing a medical emergency, please call 911 or report to your local emergency department or urgent care.   If you have a non-emergency medical problem during routine business hours, please contact your provider's office and ask to speak with a nurse.   For questions related to your Healthy [facility] health plan, please call: [phone] or visit the homepage here: [email or web address]  If you would like to schedule transportation through your Healthy [facility] plan, please call the following number at least 2 days in advance of your appointment: [phone]  For information about your ride after you set it up, call Ride Assist at [phone]. Use this number to activate a Will Call pickup, or if your transportation is late for a scheduled pickup. Use this number, too, if you need to make a change or cancel a previously scheduled reservation.  If you need transportation services right away, call [phone]. The after-hours call center is staffed 24 hours to handle ride assistance and urgent reservation requests (including discharges) 365 days a year. Urgent trips include sick visits, hospital discharge requests and life-sustaining treatment.  Call the [place] at [phone], at any time, 24 hours a day, 7 days a week. If you are in danger or need immediate medical attention call 911.  If you would like help to quit smoking, call 1-800-QUIT-NOW 7174375700) OR Espaol: 1-855-Djelo-Ya (9-476-546-5035) o para ms informacin haga clic aqu or Text READY to 200-400 to register via text  Gerald Olson - following are the goals we discussed in your visit today:   Goals  Addressed             This Visit's Progress    RNCM - Chronic Back Pain Management       Timeframe:  Long-Range Goal Priority:  High Start Date:   05/09/2021                          Expected End Date: 03/27/2022         Patient Goals: Patient will self administer medications as prescribed Patient will attend all scheduled provider appointments Patient will call pharmacy for medication refills Patient will continue to perform ADL's independently Patient will continue to perform IADL's independently Patient will call provider office for new concerns or questions Patient will work with BSW to address care coordination needs and will continue to work with the clinical team to address health care and disease management related needs.   Patient's pain will be managed to a tolerable pain level where patient can be functional with ADL's and IADL's.                    RNCM - CML Disease Progression Minimized or Managed       Timeframe:  Long-Range Goal Priority:  High Start Date:     05/09/2021                       Expected End Date:  03/27/2022     Patient Goals: Patient will self administer medications as prescribed Patient will attend all scheduled provider appointments Patient will call pharmacy for medication refills Patient will continue to perform ADL's independently Patient will continue to  perform IADL's independently Patient will call provider office for new concerns or questions Patient will work with BSW to address care coordination needs and will continue to work with the clinical team to address health care and disease management related needs.                              RNCM - Glycemic Management & Monitored       Timeframe:  Long-Range Goal Priority:  High Start Date:     05/09/2021                        Expected End Date:  03/27/2022  Patient Goals: Patient will self administer medications as prescribed Patient will attend all scheduled provider  appointments Patient will call pharmacy for medication refills Patient will continue to perform ADL's independently Patient will continue to perform IADL's independently Patient will call provider office for new concerns or questions Patient will work with BSW to address care coordination needs and will continue to work with the clinical team to address health care and disease management related needs.      Patient monitor blood sugars twice a day at varies times using Accu-check guide and record results. A1C level will remain at 6.0 or below.            RNCM - Hypertension Monitored and Managed       Timeframe:  Long-Range Goal Priority:  High Start Date:  05/09/2021                           Expected End Date:  8/1//2023  Patient Goals Patient will self administer medications as prescribed Patient will attend all scheduled provider appointments Patient will call pharmacy for medication refills Patient will continue to perform ADL's independently Patient will continue to perform IADL's independently Patient will call provider office for new concerns or questions Patient will work with BSW to address care coordination needs and will continue to work with the clinical team to address health care and disease management related needs.    Patient will check blood pressure with new blood pressure monitor at least once a week and record readings.                   Please see education materials related to today's visit provided by MyChart link.  Patient verbalizes understanding of instructions and care plan provided today and agrees to view in [place]. Active MyChart status and patient understanding of how to access instructions and care plan via MyChart confirmed with patient.     The Managed Medicaid care management team will reach out to the patient again over the next 40 days.   Gerald Marvel RN, BSN Community Care Coordinator [facility] Mobile:  [phone]   Following is a copy of your plan of care:  Care Plan : [facility] Plan of Care  Updates made by [name], RN since 01/17/2022 12:00 AM     Problem: Chronic Disease Management and Care Coordination Needs for DM, HTN, CML and Back Pain      Long-Range Goal: Plan of Care for Chronic Disease management and Care Coordination Needs (HTN, DM, CML, Chronic Back Pain)   Start Date: 05/09/2021  Expected End Date: 03/27/2022  Priority: High  Note:   Current Barriers:  Knowledge Deficits related to plan of care for  management of HTN, DMII, and CML and Chronic Back Pain  Care Coordination needs related to Financial constraints related to paying back taxes on house and Limited social support  Chronic Disease Management support and education needs related to HTN, DMII, and CML and Chronic Back Pain Financial Constraints.  Transportation barriers Non-adherence to prescribed medication regimen No Advanced Directives in place  RNCM Clinical Goal(s):  Patient will verbalize understanding of plan for management of HTN, DMII, and CML and Chronic Back Pain verbalize basic understanding of HTN, DMII, and CML and Chronic Back Pain disease process and self health management plan  take all medications exactly as prescribed and will call provider for medication related questions attend all scheduled medical appointments: 02/14/2022 Dr. Ellene Route (Neurosurgeon); 03/13/2022 Neurologist. demonstrate ongoing adherence to prescribed treatment plan for HTN, DMII, and CML and Chronic Back Pain as evidenced by daily monitoring and recording of CBG, adherence to ADA/ carb modified diet, adherence to prescribed medication regimen, contacting provider for new or worsened symptoms or questions  demonstrate ongoing health management independence  continue to work with RN Care Manager to address care management and care coordination needs related to HTN, DMII, and CML and Chronic Back Pain  work with pharmacist  to address complex medication regimen  related to HTN, DMII, and CML and Chronic Back Pain work with Education officer, museum to address financial constraints related to overdue payment of back taxes and potential risk of losing home, Limited social support and counseling for anxiety/stress related to the management of HTN, DMII, and CML and Chronic Back Pain. collaborate with the care management team towards completion of advanced directives  through collaboration with RN Care manager, provider, and care team.   Interventions: Inter-disciplinary care team collaboration (see longitudinal plan of care) Evaluation of current treatment plan related to  self management and patient's adherence to plan as established by provider   Diabetes:  (Status: Goal on Track (progressing): YES.) Lab Results  Component Value Date   HGBA1C 6.1 (A) 01/16/2022  Assessed patient's understanding of A1c goal: <6.5% Continue to provided education to patient about basic DM disease process; Reviewed medications with patient and discussed importance of medication adherence. Patient compliant with medications.  Noted decrease in Lantus insulin to 3 units on 12/26/2021 at Dr. Cordelia Pen office visit. Counseled on importance of regular laboratory monitoring as prescribed;        Discussed plans with patient for ongoing care management follow up and provided patient with direct contact information for care management team;      Reviewed scheduled/upcoming provider appointments including: See RNCM Clinical Goals above.      Patient reports checking his blood sugar twice a day using his Accu-check Guide glucometer.  Blood sugar range during the first half of the month: 105 -130 and  last half of the month: 140 - 180.  Reports blood sugar range of 160 - 180 during the day.  The highest reading being 182. The noted difference in blood sugar range during the early part of the month vs the latter part of the month is related to the food choices he  has to eat because of decreased food stamp amount towards the end of the month.  At the end of the month the patient only has enough money for canned foods, pasta & processed foods.  Patient informed this RN Care Manager that he has a noticeable increase in blood sugar readings when he eats gluten food items, such as pasta and breads vs straight carbohydrates.  Patient reports no hypoglycemic or hyperglycemic episodes.       Call provider for findings outside established parameters.      Referral made to pharmacy team previously for assistance with complex medication regimen.  Patient doing well with medication compliance and his understanding of all medications.  Pharmacist referral not needed at this time.       Review of patient status, including review of consultants reports, relevant laboratory and other test results, and medications completed.  Patient understands the importance of drinking low sugar drinks and use of sugar substitutes.        Chronic Myeloid Leukemia (CML)  (Status: Goal on Track (progressing): YES.) Evaluation of current treatment plan related to  CML and patient's adherence to plan as established by provider.  Patient continues to be in remission and the plan is for patient to continue on Gleevec 400 mg daily.  Patient continues to take the [place] with some protein to avoid having diarrhea.  Discussed plans with patient for ongoing care management follow up and provided patient with direct contact information for care management team Reviewed medications with patient and discussed the importance of taking all medications as prescribed; Pharmacy referral for complex medication regimen made previously.  [facility] Pharmacist not needed at this time. Patient did mention how Medicaid Insurance denied payment for recent blood work related to [facility].  Patient is considering appealing this denial.   Hypertension: (Status: Goal on Track (progressing): YES.) Last practice recorded Blood  Pressure readings: BP Readings from Last 3 Encounters:  01/16/22 115/80  12/26/21 112/76  09/22/21 (!) 144/90    Most recent eGFR/CrCl: No results found for: EGFR  No components found for: CRCL  Evaluation of current treatment plan related to hypertension self management and patient's adherence to plan as established by provider;   Reviewed medications with patient and discussed importance of compliance.  Discussed plans with patient for ongoing care management follow up and provided patient with direct contact information for care management team; Discussed patient's recent blood pressure readings at recent medical office visits.  Reviewed scheduled/upcoming provider appointments including: See RNCM Clinical Goals above. Patient did obtain a blood pressure monitor through [facility] this past month.  Patient report most recent blood pressure reading at home: 138/87 with heart rate 93.  Continued to re-enforce education on Orthostatic blood pressure changes that occur normally when changing positions from lying to sitting to standing.  Patient continues to report he does experience some lightheaded with moving from a sitting to a standing position which only last a few seconds.  Patient checks his blood pressure and heart rate at time of episode with blood pressure 125/84 and heart rate elevated.  Patient suspects caffeine may be making heart rate elevated. Instructed patient to change positions slowly and continue to monitor. Patient verbalized understanding.   Pain:  (Status: Goal on Track (progressing): YES.) Pain assessment performed.  Patient reports chronic neck & back pain.  Patient's back pain intensified starting 5 days ago when overdoing activities plus worsening the pain today by bending over to help move a box spring mattress. Today, he rates back pain while sitting a '6' and '8 - 10" with movement.  Pain rating is on a Pain Scale of 0 - 10. Currently using Ibuprofen 600 mg PRN  during the day and Tramadol at night with fair relief - able to sleep only 2 hours at a stretch.  Patient took tramadol 1 tab approximately every 2 hours (3 tablets within 6  hours) last night with fair relief.  Pain interferes with sleep due to staying in same position for 2 hours.  Per patient, his pain is either triggered by staying in same position for a prolonged period of time or by moving too much. Medications reviewed.  Reviewed provider established plan for pain management; Discussed importance of adherence to all scheduled medical appointments; See [facility] Behavioral Clinical Goals above.  Has appointment with Dr. Ellene Route on 02/14/2022. 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  Patient did have a fall approximately 7 weeks ago due to the Neuropathy. He fell backwards in laundry room but was able to land on his right side resulting in bruising on right hip and entire right side; no bones broken; no cuts or bleeding. Patient reports a recent fall in Living Room caused by left leg neuropathy.  Injuries occurred: bruising and small cuts on arm.  Patient also reports noted swelling in left leg from knee down to foot & ankle and is using compression socks which seem to help relieve swelling.  Will continue to monitor.      Patient Goals/Self-Care Activities: Patient will self administer medications as prescribed as evidenced by self report/primary caregiver report  Patient will attend all scheduled provider appointments as evidenced by clinician review of documented attendance to scheduled appointments and patient/caregiver report Patient will call pharmacy for medication refills as evidenced by patient report and review of pharmacy fill history as appropriate Patient will continue to perform ADL's independently as evidenced by patient/caregiver report Patient will continue to perform IADL's independently as  evidenced by patient/caregiver report Patient will call provider office for new concerns or questions as evidenced by review of documented incoming telephone call notes and patient report Patient will work with BSW to address care coordination needs and will continue to work with the clinical team to address health care and disease management related needs as evidenced by documented adherence to scheduled care management/care coordination appointments - check blood sugar at prescribed times: twice daily - enter blood sugar readings and medication or insulin into daily log - take the blood sugar log to all doctor visits - check blood pressure weekly - write blood pressure results in a log or diary - keep a blood pressure log - take blood pressure log to all doctor appointments - call doctor for signs and symptoms of high blood pressure - keep all doctor appointments - take medications for blood pressure exactly as prescribed

## 2022-01-19 ENCOUNTER — Other Ambulatory Visit (HOSPITAL_COMMUNITY): Payer: Self-pay

## 2022-01-19 MED FILL — Glucose Blood Test Strip: 25 days supply | Qty: 50 | Fill #0 | Status: AC

## 2022-01-24 ENCOUNTER — Other Ambulatory Visit (HOSPITAL_COMMUNITY): Payer: Self-pay

## 2022-01-26 ENCOUNTER — Other Ambulatory Visit (HOSPITAL_COMMUNITY): Payer: Self-pay

## 2022-02-07 DIAGNOSIS — M4317 Spondylolisthesis, lumbosacral region: Secondary | ICD-10-CM | POA: Diagnosis not present

## 2022-02-07 DIAGNOSIS — M5126 Other intervertebral disc displacement, lumbar region: Secondary | ICD-10-CM | POA: Diagnosis not present

## 2022-02-13 ENCOUNTER — Other Ambulatory Visit (HOSPITAL_COMMUNITY): Payer: Self-pay

## 2022-02-14 ENCOUNTER — Ambulatory Visit: Payer: Medicaid Other

## 2022-02-16 DIAGNOSIS — M546 Pain in thoracic spine: Secondary | ICD-10-CM | POA: Diagnosis not present

## 2022-02-16 DIAGNOSIS — M4810 Ankylosing hyperostosis [Forestier], site unspecified: Secondary | ICD-10-CM | POA: Diagnosis not present

## 2022-02-16 DIAGNOSIS — Z6829 Body mass index (BMI) 29.0-29.9, adult: Secondary | ICD-10-CM | POA: Diagnosis not present

## 2022-02-19 ENCOUNTER — Other Ambulatory Visit: Payer: Self-pay

## 2022-02-21 ENCOUNTER — Ambulatory Visit: Payer: Medicaid Other

## 2022-02-26 ENCOUNTER — Other Ambulatory Visit (HOSPITAL_COMMUNITY): Payer: Self-pay

## 2022-03-02 ENCOUNTER — Other Ambulatory Visit (HOSPITAL_COMMUNITY): Payer: Self-pay

## 2022-03-07 ENCOUNTER — Encounter: Payer: Self-pay | Admitting: Adult Health

## 2022-03-07 ENCOUNTER — Ambulatory Visit (INDEPENDENT_AMBULATORY_CARE_PROVIDER_SITE_OTHER): Payer: Medicaid Other | Admitting: Adult Health

## 2022-03-07 ENCOUNTER — Other Ambulatory Visit (HOSPITAL_COMMUNITY): Payer: Self-pay

## 2022-03-07 VITALS — BP 132/94 | HR 98 | Ht 75.0 in | Wt 238.0 lb

## 2022-03-07 DIAGNOSIS — R4189 Other symptoms and signs involving cognitive functions and awareness: Secondary | ICD-10-CM

## 2022-03-07 DIAGNOSIS — E1142 Type 2 diabetes mellitus with diabetic polyneuropathy: Secondary | ICD-10-CM

## 2022-03-07 NOTE — Progress Notes (Signed)
[facility] [address] [place]. [place] [address] [phone]       OFFICE FOLLOW UP VISIT NOTE  [name] Date of Birth:  [birth year] Medical Record Number:  [id number]   Referring MD: Cammie Fulp Reason for Referral: Neuropathy   Chief Complaint  Patient presents with   Follow-up    Rm 3 alone Pt is well, has been having cramping and numbness in legs. Has had a few falls due to pain.        HPI:  Update 03/07/2022 JM: Patient returns for 57-monthneuropathy follow-up unaccompanied.  He continues to experience neuropathic pains with cramping and numbness in bilateral lower extremities as well as occasionally left hand.  He believes his diabetic neuropathy has been worsening.  Continues to follow with Dr. EEllene Routefor spine and LLE pain, he has had a couple falls since prior visit due to this pain but thankfully without significant injury.  Previously tried amitriptyline but unable to tolerate due to fatigue, previously no benefit on topiramate nor gabapentin. He questions benefit with use of Qutenza. No further concerns at this time.     History provided for reference purposes only Update 09/11/2021 JM: Patient returns for 666-monthollow-up unaccompanied. Reports continued left leg pain persistence since decompression procedure 12/2020. Reports self discontinued topamax around December as he didn't believe any benefit - denies worsening since stopping - continues tramadol only 2-3x per week managed by PCP. Does report some worsening right side back pain back in September but feels he may have over did it as he was trying to increase exercise. Seen by ortho 06/2021 who advised to f/u with neurosurgery but has not yet seen them - he was also advised of this at prior visit 6 months ago. Obtained xrays during ortho visit which showed in-place hardware from L5-S1 fusion and no acute pathology per ortho report (unable to personally view via epic).  No further concerns  at this time  Update 03/01/2021 JM: Gerald Olson for 6-42-monthllow-up regarding neuropathy and cognitive complaints.  Since prior visit, underwent surgical decompression and arthrodesis at L5-S1 by Dr. ElsEllene Router spondylolisthesis L5-S1 with lumbar radiculopathy.  He reports increased pain since procedure particularly left leg -he reports recently being seen by Dr. ElsEllene Routed was told to further discuss pain management options with our office today (again, per patient report). He has remained on topiramate 50 mg AM and 100 mg PM in combination with alpha-lipoic acid which have previously been stable for diabetic neuropathy.  He has no longer on duloxetine.  He discusses use of Percocet after procedure but this makes him too drowsy.  He was seen by his PCP who placed him on tramadol with benefit -he reports previously being seen by pain management but does not wish to go back due to restriction of other providers managing pain or prescribing medications.  He continues to follow with psychiatry for depression with recent visit 6/14.  Reports cognition stable since prior visit.  He was also seen by PCP 6/14 but unable to access OV note for review.  No further concerns at this time  Update 08/30/2020 JM: Gerald Olson for follow-up after being seen 4 months prior for neuropathy and cognitive complaints.  Remains on topiramate 50 mg twice daily for diabetic neuropathy initially providing benefit and even improving especially in combination with alpha-lipoic acid but unfortunately suffered a fall approximately 1 month ago not experiencing thoracic pain and worsening left leg radiculopathy. He has follow-up  visit with orthopedics on Friday for further evaluation. Cognition stable without worsening.  Initiated duloxetine at prior visit in hopes of benefit of depression as well as paresthesias but he self discontinued as he did not feel as though this was beneficial and reported dizziness.  He routinely is followed  by psychiatry for depression and anxiety but currently declines interest in antidepression medications. He continues to follow with orthopedics Dr. Rodell Perna for spinal and lumbar stenosis previously participating in outpatient PT but currently on hold due to recent fall. 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 No further concerns at this time.  Update 04/27/2020 JM: Gerald Olson returns today for neuropathy follow-up Since prior visit, he has noticed some improvement in regards to right leg > left leg neuropathy but does report continued numbness sensation.  He believes Topamax 25 mg nightly has been beneficial and he has tolerated well.  He did trial taking 2 tablets for a couple of nights with further improvement and denies side effects.   He is also been having increased cervical pain radiating down left arm.  He has not contacted Dr. [name] in regards to worsening symptoms over the past 2 months.  He also reports right shoulder burning type pain that can wake him up while sleeping.  He reports he has not slept in the past 3 nights due to neck and shoulder pain.  He questions possibility of fibromyalgia due to multiple pain locations. Awaiting lumbar and cervical procedure once diabetes becomes well controlled but he continues to have difficulty.  Continues to follow with endocrinology. He also complains of cognitive concerns including short-term and long-term memory, delayed recall and occasional word finding difficulty.  He denies any prior stroke history or TBI.  He did have neurocognitive evaluation by Dr. Melvyn Novas on 03/31/2020 did show just chronic weakness across processing speed and variability noted approximately complex attention as well as encoding and retrieval aspects of verbal and visual memory.  Etiology potentially pronounced psychiatric distress, ongoing sleep dysfunction, chronic pain, history of ADHD, vascular factors  such as uncontrolled diabetes and potentially vascular changes would affect similar areas of cognitive functioning which could contribute to ongoing deficits.  No concerns for early onset neurodegenerative illness such as Alzheimer's disease at the time of testing. Recommendations of obtaining MRI for further evaluation as well as medication and psychotherapy for treatment of anxiety and depression as well as pain management and different lifestyle options (heavy alcohol intake, heavy caffeine intake, and THC use).  Update 01/28/2020 Dr. Leonie Man : He returns for follow-up after last visit 6 months ago.  He states that he did increase the gabapentin but was taking it only twice daily.  It helps with the pins-and-needles sensation but is still in constant pain as well as feels numb.  In the last few weeks he feels symptoms may gotten worse.  Is also gained about 20 pounds this year.  He also complains of involuntary myoclonic jerk-like movements in his left hand.  He had EMG nerve conduction study done on 10/22/2019 by Dr. 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  Lab works for reversible causes of neuropathy on 10/08/2019 pretty much normal except for low vitamin D levels of 114.  Patient states he has known low vitamin D level since last 20 years and was on vitamin D replacement  however few weeks prior to seeing me and last visit here ran out of vitamin D and had not restarted taking it due to financial reasons.  He has chronic back pain with left leg radiculopathy and he plans on having back surgery but needs to lose some weight prior to that.  He has been taking while like to switch seems to help his chronic pain.  He has not tried Topamax and is willing to try that.  Initial visit 10/08/2019 Dr. Leonie Man: [name] is a 56 year old Caucasian male with past medical history of diabetes,  hypertension, obesity, degenerative arthritis and chronic back and neck pain who has been having lower extremity paresthesias for the last 6 months which seem to be progressive.  He complains of constant burning, tingling and pins and needle sensation.  At times this becomes bearable.  This is present throughout the day.  He recently started taking gabapentin 100 mg twice daily which is not being effective.  He is also noticed that his balance is poor and has had frequent falls.  In fact he had a fall yesterday and today seems to be in acute pain and his gait is limited.  He has a longstanding history of degenerative cervical and lumbar spine disease.  He has been seen by orthopedic surgeon wants to operate on his neck but his diabetes is out of control and is working with endocrinologist to bring it under control so he can have the surgery.  Review of electronic medical records show that he had a cervical spine x-ray on 09/22/2019 which showed degenerative changes from C3-C7.  X-rays of lumbar spine on 07/21/2018 show significant facet degenerative changes at L5-S1 with significant foraminal narrowing on the right at the L4-5 and on the left at L5-S1.  He takes tramadol and muscle relaxant Robaxin which helps partially only.  He has not had any EMG nerve conduction studies done or lab work for other reversible causes of neuropathy done.  He states his feet are now constantly numb with lack of feeling which has been contributing to his falling.  His sugars have been persistently high and last A1c on 10/01/2019 was yet high at 7.6 but improved from 9.1 from 3 months ago.      ROS:   14 system review of systems is positive for see HPI and all other systems negative  PMH:  Past Medical History:  Diagnosis Date   ADHD (attention deficit hyperactivity disorder)    Diagnosed during teenage years   Alcohol use disorder    On average 14-21 drinks per week   Alcoholic cirrhosis of liver    Per patient -  condition is not related to moderate alcohol use but rather prolonged NSAID use and recent abuse.   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[place]   Major depressive disorder    Other spondylosis with radiculopathy, cervical region    Retinopathy    Right eye; per patient report   Type 2 diabetes mellitus with hyperglycemia     Social History:  Social History  Socioeconomic History   Marital status: Single    Spouse name: Not on file   Number of children: Not on file   Years of education: 67   Highest education level: Master's degree (e.g., MA, MS, MEng, MEd, MSW, MBA)  Occupational History   Not on file  Tobacco Use   Smoking status: Former    Types: Cigarettes    Quit date: 06/25/2003    Years since quitting: 18.7   Smokeless tobacco: Never   Tobacco comments:    Currently vapes  Vaping Use   Vaping Use: Every day  Substance and Sexual Activity   Alcohol use: Yes    Alcohol/week: 14.0 - 21.0 standard drinks of alcohol    Types: 14 - 21 Cans of beer per week    Comment: 2-3 beers per night on average; sometimes up to 6   Drug use: Yes    Types: Marijuana    Comment: 1/week to help sleep; edibles, does not smoke   Sexual activity: Yes  Other Topics Concern   Not on file  Social History Narrative   Not on file   Social Determinants of Health   Financial Resource Strain: High Risk (05/09/2021)   Overall Financial Resource Strain (CARDIA)    Difficulty of Paying Living Expenses: Hard  Food Insecurity: No Food Insecurity (05/09/2021)   Hunger Vital Sign    Worried About Running Out of Food in the Last Year:  Never true    Ran Out of Food in the Last Year: Never true  Transportation Needs: Unmet Transportation Needs (05/09/2021)   PRAPARE - Transportation    Lack of Transportation (Medical): Yes    Lack of Transportation (Non-Medical): Yes  Physical Activity: Insufficiently Active (05/09/2021)   Exercise Vital Sign    Days of Exercise per Week: 3 days    Minutes of Exercise per Session: 10 min  Stress: Stress Concern Present (07/03/2021)   [place]    Feeling of Stress : Very much  Social Connections: Socially Isolated (05/09/2021)   Social Connection and Isolation Panel [NHANES]    Frequency of Communication with Friends and Family: More than three times a week    Frequency of Social Gatherings with Friends and Family: More than three times a week    Attends Religious Services: Never    Marine scientist or Organizations: No    Attends Music therapist: Never    Marital Status: Never married  Human resources officer Violence: Not on file    Medications:   Current Outpatient Medications on File Prior to Visit  Medication Sig Dispense Refill   Blood Pressure Monitoring (BLOOD PRESSURE MONITOR AUTOMAT) DEVI Check blood pressure in AM and PM daily 1 each 0   glucose blood (ACCU-CHEK GUIDE) test strip USE AS DIRECTED 2 TIMES DAILY 180 strip 0   ibuprofen (IBU) 800 MG tablet Take 1 tablet every 8 hours as needed for pain 20 tablet 0   imatinib (GLEEVEC) 400 MG tablet Take 1 tablet (400 mg total) by mouth daily. 30 tablet 5   insulin glargine (LANTUS SOLOSTAR) 100 UNIT/ML Solostar Pen Inject 3 Units into the skin every morning. 15 mL 0   Insulin Pen Needle (UNIFINE PENTIPS) 32G X 4 MM MISC Use to inject Victoza once a day 100 each 11   lisinopril (ZESTRIL) 10 MG tablet Take 1 tablet by mouth daily. 90 tablet 3   traMADol (ULTRAM) 50 MG tablet Take 1 tablet (  50 mg total) by mouth 3 (three) times daily as needed for  severe pain. 30 tablet 0   [DISCONTINUED] DULoxetine (CYMBALTA) 60 MG capsule Take 1 capsule (60 mg total) by mouth 2 (two) times daily. (Patient not taking: No sig reported) 60 capsule 1   No current facility-administered medications on file prior to visit.    Allergies:   No Known Allergies   Physical exam:   Today's Vitals   03/07/22 1334  BP: (!) 132/94  Pulse: 98  Weight: 238 lb (108 kg)  Height: '6\' 3"'$  (1.905 m)   Body mass index is 29.75 kg/m.   Physical Exam General: Pleasant middle-aged Caucasian male seated, in no evident distress Head: head normocephalic and atraumatic.   Neck: supple with no carotid or supraclavicular bruits Cardiovascular: regular rate and rhythm, no murmurs Skin:  no rash/petichiae Vascular:  Normal pulses all extremities  Neurologic Exam Mental Status: Awake and fully alert. Oriented to place and time. Recent and remote memory intact during visit. Attention span, concentration and fund of knowledge appropriate during visit. Mood and affect appropriate.   Cranial Nerves: Pupils equal, briskly reactive to light. Extraocular movements full without nystagmus. Visual fields full to confrontation. Hearing intact. Facial sensation intact. Face, tongue, palate moves normally and symmetrically.  Motor: Normal bulk and tone. Normal strength in all tested extremity muscles.   Sensory.:  Diminished   touch , pinprick sensation in both feet stocking distribution.  Impaired vibration in both feet from ankle down.  Position sense mildly impaired.   Coordination: Rapid alternating movements normal in all extremities. Finger-to-nose and heel-to-shin performed accurately bilaterally. Fine action tremor of both outstretched upper extremities left greater than right - stable.  No pill-rolling or cogwheel rigidity. Gait and Station: Arises from chair with  difficulty. Stance is stooped and favors his back and left leg.  Broad-based antalgic gait without use of AD.   Difficulty performing tandem walking heel toe Reflexes: 1+ and symmetric in upper extremities and absent in both lower extremities. Toes downgoing.        ASSESSMENT/PLAN: 56 year old Caucasian male with lower extremity paresthesias and pain due to worsening diabetic peripheral neuropathic pain.  He also has chronic back pain and left leg sciatica as well as increasing falls and gait difficulties due to degenerative lumbar and cervical spine disease. Prior complaints of cognitive deficits which have been stable.     Peripheral neuropathy, diabetic -EMG/NCV 10/22/2019 diabetic peripheral polyneuropathy -intolerant/no benefit to amitrityline, topiramate, gabapentin and duloxetine  -referral placed to PMR for potential benefit with Qutenza    Spondylolisthesis L5-S1 with lumbar radiculopathy Cervical pain -s/p surgical decompression and arthrodesis L5-S1 by Dr. Ellene Route -Worsening neuropathy pain post procedure and advised to follow-up with neurosurgery for further treatment options -consider participation with PT (possibly aquatic therapy) but will defer to NS   Cognitive deficits -Overall stable since prior without worsening -per neurocognitive evaluation concern for cognitive decline compared to prior testing although reported main contributor likely psychiatric distress but also potentially vascular in nature -MRI brain w/wo contrast 05/29/2020 scattered T2/FLAIR punctate hyperintense foci in the subcortical deep white matter likely representing mild chronic microvascular ischemic change in brain volume mildly less than expected for age otherwise unremarkable -Continues to be followed by psychiatry     Follow-up as needed at this time. If no benefit with use of Qutenza, may need to establish care with pain clinic as there are limited further treatment options that we can provide in regards to his diabetic neuropathy  CC:  [name], PA-C    I spent 31 minutes of  face-to-face and non-face-to-face time with patient.  This included previsit chart review, lab review, study review, order entry, electronic health record documentation, patient education/discussion regarding ongoing neuropathy with paresthesias and further treatment recommendations, cognitive complaints, spinal and LLE pain and answered all other questions to patient satisfaction  Frann Rider, AGNP-BC  [facility] [address] [place], [place] [address]  Phone [phone] Fax [phone] Note: This document was prepared with digital dictation and possible smart phrase technology. Any transcriptional errors that result from this process are unintentional. What is mild for Elevated When She Went to normal already Having normal UTI yearly normal discharge diet adjusted

## 2022-03-07 NOTE — Patient Instructions (Addendum)
Referral placed to physical medicine and rehab for evaluation with use of Qutenza   Continue to follow with Dr. Ellene Route for back issues and left leg pain as well as discussion regarding possible benefit with physical therapy (such as aquatic therapy)

## 2022-03-08 ENCOUNTER — Other Ambulatory Visit (HOSPITAL_COMMUNITY): Payer: Self-pay

## 2022-03-13 ENCOUNTER — Ambulatory Visit: Payer: Medicaid Other | Admitting: Adult Health

## 2022-03-20 ENCOUNTER — Inpatient Hospital Stay: Payer: Medicaid Other

## 2022-03-20 ENCOUNTER — Other Ambulatory Visit (HOSPITAL_COMMUNITY): Payer: Self-pay

## 2022-03-22 ENCOUNTER — Encounter: Payer: Self-pay | Admitting: Physical Medicine & Rehabilitation

## 2022-03-22 ENCOUNTER — Other Ambulatory Visit (HOSPITAL_COMMUNITY): Payer: Self-pay

## 2022-03-26 ENCOUNTER — Other Ambulatory Visit (HOSPITAL_COMMUNITY): Payer: Self-pay

## 2022-03-26 ENCOUNTER — Inpatient Hospital Stay: Payer: Medicaid Other | Attending: Oncology

## 2022-03-26 DIAGNOSIS — C9210 Chronic myeloid leukemia, BCR/ABL-positive, not having achieved remission: Secondary | ICD-10-CM | POA: Insufficient documentation

## 2022-03-26 LAB — CBC WITH DIFFERENTIAL/PLATELET
Abs Immature Granulocytes: 0.02 10*3/uL (ref 0.00–0.07)
Basophils Absolute: 0.1 10*3/uL (ref 0.0–0.1)
Basophils Relative: 1 %
Eosinophils Absolute: 1.2 10*3/uL — ABNORMAL HIGH (ref 0.0–0.5)
Eosinophils Relative: 17 %
HCT: 36.4 % — ABNORMAL LOW (ref 39.0–52.0)
Hemoglobin: 11.7 g/dL — ABNORMAL LOW (ref 13.0–17.0)
Immature Granulocytes: 0 %
Lymphocytes Relative: 21 %
Lymphs Abs: 1.5 10*3/uL (ref 0.7–4.0)
MCH: 28.3 pg (ref 26.0–34.0)
MCHC: 32.1 g/dL (ref 30.0–36.0)
MCV: 88.1 fL (ref 80.0–100.0)
Monocytes Absolute: 0.7 10*3/uL (ref 0.1–1.0)
Monocytes Relative: 10 %
Neutro Abs: 3.6 10*3/uL (ref 1.7–7.7)
Neutrophils Relative %: 51 %
Platelets: 255 10*3/uL (ref 150–400)
RBC: 4.13 MIL/uL — ABNORMAL LOW (ref 4.22–5.81)
RDW: 16.5 % — ABNORMAL HIGH (ref 11.5–15.5)
WBC: 7 10*3/uL (ref 4.0–10.5)
nRBC: 0 % (ref 0.0–0.2)

## 2022-03-26 LAB — COMPREHENSIVE METABOLIC PANEL
ALT: 30 U/L (ref 0–44)
AST: 44 U/L — ABNORMAL HIGH (ref 15–41)
Albumin: 3.7 g/dL (ref 3.5–5.0)
Alkaline Phosphatase: 70 U/L (ref 38–126)
Anion gap: 7 (ref 5–15)
BUN: 14 mg/dL (ref 6–20)
CO2: 27 mmol/L (ref 22–32)
Calcium: 8.5 mg/dL — ABNORMAL LOW (ref 8.9–10.3)
Chloride: 102 mmol/L (ref 98–111)
Creatinine, Ser: 0.93 mg/dL (ref 0.61–1.24)
GFR, Estimated: >60 mL/min (ref >60)
Glucose, Bld: 168 mg/dL — ABNORMAL HIGH (ref 70–99)
Potassium: 4.2 mmol/L (ref 3.5–5.1)
Sodium: 136 mmol/L (ref 135–145)
Total Bilirubin: 0.7 mg/dL (ref 0.3–1.2)
Total Protein: 6 g/dL — ABNORMAL LOW (ref 6.5–8.1)

## 2022-03-26 LAB — PHOSPHORUS: Phosphorus: 3.4 mg/dL (ref 2.5–4.6)

## 2022-03-26 LAB — MAGNESIUM: Magnesium: 2.4 mg/dL (ref 1.7–2.4)

## 2022-03-27 ENCOUNTER — Other Ambulatory Visit (HOSPITAL_COMMUNITY): Payer: Self-pay

## 2022-03-28 ENCOUNTER — Other Ambulatory Visit: Payer: Self-pay | Admitting: Physician Assistant

## 2022-03-28 ENCOUNTER — Other Ambulatory Visit: Payer: Self-pay | Admitting: Obstetrics and Gynecology

## 2022-03-28 ENCOUNTER — Other Ambulatory Visit (HOSPITAL_COMMUNITY): Payer: Self-pay

## 2022-03-28 DIAGNOSIS — M545 Low back pain, unspecified: Secondary | ICD-10-CM

## 2022-03-28 DIAGNOSIS — Z981 Arthrodesis status: Secondary | ICD-10-CM

## 2022-03-28 DIAGNOSIS — M4317 Spondylolisthesis, lumbosacral region: Secondary | ICD-10-CM

## 2022-03-28 MED ORDER — TRAMADOL HCL 50 MG PO TABS
50.0000 mg | ORAL_TABLET | Freq: Three times a day (TID) | ORAL | 0 refills | Status: DC | PRN
  Filled 2022-03-28: qty 30, 10d supply, fill #0

## 2022-03-28 NOTE — Patient Outreach (Signed)
Medicaid Managed Care   Nurse Care Manager Note  03/28/2022 Name:  BARBARA KENG MRN:  [id number] DOB:  Aug 18, 1966  Gerald Olson is an 56 y.o. year old male who is a primary patient of Ok Edwards, Lillia Abed, [place].  [facility] Managed Care Coordination team was consulted for assistance with:    Chronic healthcare management needs, DM, leukemia-in remission, cirrhosis, HTN, neuropathy, anxiety/depression/ADHD  Mr. Valverde was given information about Medicaid Managed Care Coordination team services today. Gerald Olson Patient agreed to services and verbal consent obtained.  Engaged with patient by telephone for follow up visit in response to provider referral for case management and/or care coordination services.   Assessments/Interventions:  Review of past medical history, allergies, medications, health status, including review of consultants reports, laboratory and other test data, was performed as part of comprehensive evaluation and provision of chronic care management services.  SDOH (Social Determinants of Health) assessments and interventions performed: SDOH Interventions    Flowsheet Row Most Recent Value  SDOH Interventions   Transportation Interventions Intervention Not Indicated      Care Plan  No Known Allergies  Medications Reviewed Today     Reviewed by Danie Chandler, RN (Registered Nurse) on 03/28/22 at 1509  Med List Status: <None>   Medication Order Taking? Sig Documenting Provider Last Dose Status Informant  Blood Pressure Monitoring (BLOOD PRESSURE MONITOR AUTOMAT) DEVI [id number] No Check blood pressure in AM and PM daily Alfredia Ferguson, PA-C Taking Active   Patient not taking:  Discontinued 08/09/20 1309 (Patient Preference)   glucose blood (ACCU-CHEK GUIDE) test strip [id number] No USE AS DIRECTED 2 TIMES DAILY Carlus Pavlov, MD Taking Active   ibuprofen (IBU) 800 MG tablet [id number] No Take 1 tablet every 8 hours as needed for pain  Taking Active   imatinib  (GLEEVEC) 400 MG tablet [id number] No Take 1 tablet (400 mg total) by mouth daily. Remi Haggard, RPH-CPP Taking Active   insulin glargine (LANTUS SOLOSTAR) 100 UNIT/ML Solostar Pen [id number] No Inject 3 Units into the skin every morning.  Patient taking differently: Inject 7 Units into the skin every morning. 7 units 6 days a week   Romero Belling, MD Taking Active Self  Insulin Pen Needle (UNIFINE PENTIPS) 32G X 4 MM MISC [id number] No Use to inject Victoza once a day Romero Belling, MD Taking Active   lisinopril (ZESTRIL) 10 MG tablet [id number] No Take 1 tablet by mouth daily.  Taking Active   traMADol (ULTRAM) 50 MG tablet [id number] No Take 1 tablet (50 mg total) by mouth 3 (three) times daily as needed for severe pain. 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 08/09/2020   Mesenteric lymphadenopathy 06/14/2020   Splenomegaly- mild  06/14/2020   History of cannabis abuse 06/14/2020   Recurrent major depressive disorder, in partial remission (HCC) 06/09/2020   Anxiety 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 06/09/2020   Alcohol use disorder, moderate, dependence (HCC) 06/09/2020   Frequent falls 05/23/2020   Foraminal stenosis of cervical region 05/09/2020  Diabetic polyneuropathy    ADHD (attention deficit hyperactivity disorder)    Generalized anxiety disorder    Major depressive disorder    Cervical spondylosis 09/22/2019   Essential hypertension, benign 86/76/7209   Alcoholic cirrhosis of liver 06/02/2018   Type 2  diabetes mellitus with hyperglycemia 04/25/2018   Conditions to be addressed/monitored per PCP order:  Chronic healthcare management needs, DM, leukemia-in remission, cirrhosis, HTN, neuropathy, anxiety/depression/ADHD, LDD  Care Plan : RNCM Plan of Care  Updates made by [name], RN since 03/28/2022 12:00 AM     Problem: Chronic Disease Management and Care Coordination Needs for DM, HTN, CML and Back Pain      Long-Range Goal: Plan of Care for Chronic Disease management and Care Coordination Needs (HTN, DM, CML, Chronic Back Pain)   Start Date: 05/09/2021  Expected End Date: 06/28/2022  Priority: High  Note:   Current Barriers:  Knowledge Deficits related to plan of care for management of HTN, DMII, and CML and Chronic Back Pain  Chronic Disease Management support and education needs related to HTN, DMII, and CML and Chronic Back Pain 03/28/22:  Patient states BP WNL, no readings available from pt.  Blood sugars 130-140, has appt with endo 04/03/22.  Needs refill of Tramadol-will message PCP at patient request. Neuropathy BLE-cramping and numbness, occasionally left hand.  RNCM Clinical Goal(s):  Patient will verbalize understanding of plan for management of HTN, DMII, and CML and Chronic Back Pain verbalize basic understanding of HTN, DMII, and CML and Chronic Back Pain disease process and self health management plan  take all medications exactly as prescribed and will call provider for medication related questions attend all scheduled medical appointments demonstrate ongoing adherence to prescribed treatment plan for HTN, DMII, and CML and Chronic Back Pain as evidenced by daily monitoring and recording of CBG, adherence to ADA/ carb modified diet, adherence to prescribed medication regimen, contacting provider for new or worsened symptoms or questions  demonstrate ongoing health management independence  continue to work with RN Care Manager to address care management and care  coordination needs related to HTN, DMII, and CML and Chronic Back Pain  work with pharmacist to address complex medication regimen  related to HTN, DMII, and CML and Chronic Back Pain work with Education officer, museum to address financial constraints related to overdue payment of back taxes and potential risk of losing home, Limited social support and counseling for anxiety/stress related to the management of HTN, DMII, and CML and Chronic Back Pain. collaborate with the care management team towards completion of advanced directives  through collaboration with RN Care manager, provider, and care team.   Interventions: Inter-disciplinary care team collaboration (see longitudinal plan of care) Evaluation of current treatment plan related to  self management and patient's adherence to plan as established by provider Collaboration with PCP office for Tramadol refill at patient request.  Diabetes:  (Status: Goal on Track (progressing): YES.) Lab Results  Component Value Date   HGBA1C 6.1 (A) 01/16/2022  Assessed patient's understanding of A1c goal: <6.5% Continue to provided education to patient about basic DM disease process; Reviewed medications with patient and discussed importance of medication adherence. Counseled on importance of regular laboratory monitoring as prescribed;        Discussed plans with patient for ongoing care management follow up and provided patient with direct contact information for care management team;      Reviewed scheduled/upcoming provider appointments including: See RNCM Clinical Goals above.  Call provider for findings outside established parameters.      Referral made to pharmacy team previously for assistance with complex medication regimen.  Patient doing well with medication compliance and his understanding of all medications.  Pharmacist referral not needed at this time.       Review of patient status, including review of consultants reports, relevant laboratory and  other test results, and medications completed.  Patient understands the importance of drinking low sugar drinks and use of sugar substitutes.       Chronic Myeloid Leukemia (CML)  (Status: Goal on Track (progressing): YES.) Evaluation of current treatment plan related to  CML and patient's adherence to plan as established by provider.  Patient continues to be in remission and the plan is for patient to continue on Gleevec 400 mg daily.  Patient continues to take the [place] with some protein to avoid having diarrhea.  Discussed plans with patient for ongoing care management follow up and provided patient with direct contact information for care management team Reviewed medications with patient and discussed the importance of taking all medications as prescribed; Pharmacy referral for complex medication regimen made previously.  [facility] Pharmacist not needed at this time.  Hypertension: (Status: Goal on Track (progressing): YES.) Last practice recorded Blood Pressure readings: BP Readings from Last 3 Encounters:  01/16/22 115/80  12/26/21 112/76  09/22/21 (!) 144/90    Most recent eGFR/CrCl: No results found for: EGFR  No components found for: CRCL  Evaluation of current treatment plan related to hypertension self management and patient's adherence to plan as established by provider;   Reviewed medications with patient and discussed importance of compliance.  Discussed plans with patient for ongoing care management follow up and provided patient with direct contact information for care management team; Discussed patient's recent blood pressure readings at recent medical office visits.  Reviewed scheduled/upcoming provider appointments including: See RNCM Clinical Goals above. Patient did obtain a blood pressure monitor through [employer].   Continued to re-enforce education on Orthostatic blood pressure changes that occur normally when changing positions from lying to sitting to standing.   Patient continues to report he does experience some lightheaded with moving from a sitting to a standing position which only last a few seconds.  Patient checks his blood pressure and heart rate at time of episode. Patient suspects caffeine may be making heart rate elevated. Instructed patient to change positions slowly and continue to monitor. Patient verbalized understanding.  Pain:  (Status: Goal on Track (progressing): YES.) Pain assessment performed.  Patient reports chronic neck & back pain. Pain rating is on a Pain Scale of 0 - 10. Currently using Ibuprofen 600 mg PRN during the day and Tramadol at night with fair relief - able to sleep only 2 hours at a stretch.  Pain interferes with sleep due to staying in same position for 2 hours.  Per patient, his pain is either triggered by staying in same position for a prolonged period of time or by moving too much. Medications reviewed.  Reviewed provider established plan for pain management; Discussed importance of adherence to all scheduled medical appointments; Patient continues to report having Neuropathy on his left side of his body (arm and leg) causing numbness and lack of coordination with movement.    Will continue to monitor.     Patient Goals/Self-Care Activities: Patient will self administer medications as prescribed as evidenced by self report/primary caregiver report  Patient will attend all scheduled provider appointments as evidenced by clinician  review of documented attendance to scheduled appointments and patient/caregiver report Patient will call pharmacy for medication refills as evidenced by patient report and review of pharmacy fill history as appropriate Patient will continue to perform ADL's independently as evidenced by patient/caregiver report Patient will continue to perform IADL's independently as evidenced by patient/caregiver report Patient will call provider office for new concerns or questions as evidenced by review of  documented incoming telephone call notes and patient report Patient will work with BSW to address care coordination needs and will continue to work with the clinical team to address health care and disease management related needs as evidenced by documented adherence to scheduled care management/care coordination appointments - check blood sugar at prescribed times: twice daily - enter blood sugar readings and medication or insulin into daily log - take the blood sugar log to all doctor visits - check blood pressure weekly - write blood pressure results in a log or diary - keep a blood pressure log - take blood pressure log to all doctor appointments - call doctor for signs and symptoms of high blood pressure - keep all doctor appointments - take medications for blood pressure exactly as prescribed   Follow Up:  Patient agrees to Care Plan and Follow-up.  Plan: The Managed Medicaid care management team will reach out to the patient again over the next 30 business  days. and The  Patient has been provided with contact information for the Managed Medicaid care management team and has been advised to call with any health related questions or concerns.  Date/time of next scheduled RN care management/care coordination outreach:  05/08/22 at 315.

## 2022-03-28 NOTE — Patient Instructions (Signed)
Hi Mr. Bonaventure, thank you for speaking with me today-have a great afternoon!  Mr. Lampert was given information about Medicaid Managed Care team care coordination services as a part of their Healthy [facility] Medicaid benefit. [name] verbally consented to engagement with the [facility] Managed Care team.   If you are experiencing a medical emergency, please call 911 or report to your local emergency department or urgent care.   If you have a non-emergency medical problem during routine business hours, please contact your provider's office and ask to speak with a nurse.   For questions related to your Healthy [facility] health plan, please call: [phone] or visit the homepage here: [email or web address]  If you would like to schedule transportation through your Healthy [facility] plan, please call the following number at least 2 days in advance of your appointment: [phone]  For information about your ride after you set it up, call Ride Assist at [phone]. Use this number to activate a Will Call pickup, or if your transportation is late for a scheduled pickup. Use this number, too, if you need to make a change or cancel a previously scheduled reservation.  If you need transportation services right away, call [phone]. The after-hours call center is staffed 24 hours to handle ride assistance and urgent reservation requests (including discharges) 365 days a year. Urgent trips include sick visits, hospital discharge requests and life-sustaining treatment.  Call the [facility] at [phone], at any time, 24 hours a day, 7 days a week. If you are in danger or need immediate medical attention call 911.  If you would like help to quit smoking, call 1-800-QUIT-NOW (607) 467-2677) OR Espaol: 1-855-Djelo-Ya (8-546-270-3500) o para ms informacin haga clic aqu or Text READY to 200-400 to register via text  Mr. Malcomb - following are  the goals we discussed in your visit today:   Goals Addressed             This Visit's Progress    RNCM - Chronic Back Pain Management       Timeframe:  Long-Range Goal Priority:  High Start Date:   05/09/2021                          Expected End Date: ongoing  Patient Goals: Patient will self administer medications as prescribed Patient will attend all scheduled provider appointments Patient will call pharmacy for medication refills Patient will continue to perform ADL's independently Patient will continue to perform IADL's independently Patient will call provider office for new concerns or questions Patient will work with BSW to address care coordination needs and will continue to work with the clinical team to address health care and disease management related needs.   Patient's pain will be managed to a tolerable pain level where patient can be functional with ADL's and IADL's.    03/28/22:  ongoing LBP-takes medication as needed                 RNCM - CML Disease Progression Minimized or Managed       Timeframe:  Long-Range Goal Priority:  High Start Date:     05/09/2021                       Expected End Date: ongoing  Patient Goals: Patient will self administer medications as prescribed Patient will attend all scheduled provider appointments Patient will call pharmacy for medication refills Patient will continue to  perform ADL's independently Patient will continue to perform IADL's independently Patient will call provider office for new concerns or questions Patient will work with BSW to address care coordination needs and will continue to work with the clinical team to address health care and disease management related needs.  03/28/22:  No new updates.                             RNCM - Glycemic Management & Monitored       Timeframe:  Long-Range Goal Priority:  High Start Date:     05/09/2021                        Expected End Date:  ongoing  Patient  Goals: Patient will self administer medications as prescribed Patient will attend all scheduled provider appointments Patient will call pharmacy for medication refills Patient will continue to perform ADL's independently Patient will continue to perform IADL's independently Patient will call provider office for new concerns or questions Patient will work with BSW to address care coordination needs and will continue to work with the clinical team to address health care and disease management related needs.      Patient monitor blood sugars twice a day at varies times using Accu-check guide and record results. A1C level will remain at 6.0 or below.    03/28/22:  has appt with endo 04/03/22.  Checks 2 times a day-130-140         RNCM - Hypertension Monitored and Managed       Timeframe:  Long-Range Goal Priority:  High Start Date:  05/09/2021                           Expected End Date:  ongoing  Patient Goals Patient will self administer medications as prescribed Patient will attend all scheduled provider appointments Patient will call pharmacy for medication refills Patient will continue to perform ADL's independently Patient will continue to perform IADL's independently Patient will call provider office for new concerns or questions Patient will work with BSW to address care coordination needs and will continue to work with the clinical team to address health care and disease management related needs.    Patient will check blood pressure with new blood pressure monitor at least once a week and record readings.          03/28/22: checks BP occasionally, WNL per patient     Patient verbalizes understanding of instructions and care plan provided today and agrees to view in [place]. Active MyChart status and patient understanding of how to access instructions and care plan via MyChart confirmed with patient.     The Managed Medicaid care management team will reach out to the patient again over  the next 30 business  days.  The  Patient  has been provided with contact information for the Managed Medicaid care management team and has been advised to call with any health related questions or concerns.   Aida Raider RN, BSN [place] Management Coordinator - Managed Medicaid High Risk [phone]   Following is a copy of your plan of care:  Care Plan : RNCM Plan of Care  Updates made by [name], RN since 03/28/2022 12:00 AM     Problem: Chronic Disease Management and Care Coordination Needs for DM, HTN, CML and Back Pain  Long-Range Goal: Plan of Care for Chronic Disease management and Care Coordination Needs (HTN, DM, CML, Chronic Back Pain)   Start Date: 05/09/2021  Expected End Date: 06/28/2022  Priority: High  Note:   Current Barriers:  Knowledge Deficits related to plan of care for management of HTN, DMII, and CML and Chronic Back Pain  Chronic Disease Management support and education needs related to HTN, DMII, and CML and Chronic Back Pain 03/28/22:  Patient states BP WNL, no readings available from pt.  Blood sugars 130-140, has appt with endo 04/03/22.  Needs refill of Tramadol-will message PCP at patient request. Neuropathy BLE-cramping and numbness, occasionally left hand.  RNCM Clinical Goal(s):  Patient will verbalize understanding of plan for management of HTN, DMII, and CML and Chronic Back Pain verbalize basic understanding of HTN, DMII, and CML and Chronic Back Pain disease process and self health management plan  take all medications exactly as prescribed and will call provider for medication related questions attend all scheduled medical appointments demonstrate ongoing adherence to prescribed treatment plan for HTN, DMII, and CML and Chronic Back Pain as evidenced by daily monitoring and recording of CBG, adherence to ADA/ carb modified diet, adherence to prescribed medication regimen, contacting provider for new or  worsened symptoms or questions  demonstrate ongoing health management independence  continue to work with RN Care Manager to address care management and care coordination needs related to HTN, DMII, and CML and Chronic Back Pain  work with pharmacist to address complex medication regimen  related to HTN, DMII, and CML and Chronic Back Pain work with Education officer, museum to address financial constraints related to overdue payment of back taxes and potential risk of losing home, Limited social support and counseling for anxiety/stress related to the management of HTN, DMII, and CML and Chronic Back Pain. collaborate with the care management team towards completion of advanced directives  through collaboration with RN Care manager, provider, and care team.   Interventions: Inter-disciplinary care team collaboration (see longitudinal plan of care) Evaluation of current treatment plan related to  self management and patient's adherence to plan as established by provider Collaboration with PCP office for Tramadol refill at patient request.  Diabetes:  (Status: Goal on Track (progressing): YES.) Lab Results  Component Value Date   HGBA1C 6.1 (A) 01/16/2022  Assessed patient's understanding of A1c goal: <6.5% Continue to provided education to patient about basic DM disease process; Reviewed medications with patient and discussed importance of medication adherence. Counseled on importance of regular laboratory monitoring as prescribed;        Discussed plans with patient for ongoing care management follow up and provided patient with direct contact information for care management team;      Reviewed scheduled/upcoming provider appointments including: See RNCM Clinical Goals above.       Call provider for findings outside established parameters.      Referral made to pharmacy team previously for assistance with complex medication regimen.  Patient doing well with medication compliance and his understanding of  all medications.  Pharmacist referral not needed at this time.       Review of patient status, including review of consultants reports, relevant laboratory and other test results, and medications completed.  Patient understands the importance of drinking low sugar drinks and use of sugar substitutes.       Chronic Myeloid Leukemia (CML)  (Status: Goal on Track (progressing): YES.) Evaluation of current treatment plan related to  CML and patient's adherence to plan as  established by provider.  Patient continues to be in remission and the plan is for patient to continue on Gleevec 400 mg daily.  Patient continues to take the [place] with some protein to avoid having diarrhea.  Discussed plans with patient for ongoing care management follow up and provided patient with direct contact information for care management team Reviewed medications with patient and discussed the importance of taking all medications as prescribed; Pharmacy referral for complex medication regimen made previously.  [facility] Pharmacist not needed at this time.  Hypertension: (Status: Goal on Track (progressing): YES.) Last practice recorded Blood Pressure readings: BP Readings from Last 3 Encounters:  01/16/22 115/80  12/26/21 112/76  09/22/21 (!) 144/90    Most recent eGFR/CrCl: No results found for: EGFR  No components found for: CRCL  Evaluation of current treatment plan related to hypertension self management and patient's adherence to plan as established by provider;   Reviewed medications with patient and discussed importance of compliance.  Discussed plans with patient for ongoing care management follow up and provided patient with direct contact information for care management team; Discussed patient's recent blood pressure readings at recent medical office visits.  Reviewed scheduled/upcoming provider appointments including: See RNCM Clinical Goals above. Patient did obtain a blood pressure monitor through [employer].   Continued to re-enforce education on Orthostatic blood pressure changes that occur normally when changing positions from lying to sitting to standing.  Patient continues to report he does experience some lightheaded with moving from a sitting to a standing position which only last a few seconds.  Patient checks his blood pressure and heart rate at time of episode. Patient suspects caffeine may be making heart rate elevated. Instructed patient to change positions slowly and continue to monitor. Patient verbalized understanding.  Pain:  (Status: Goal on Track (progressing): YES.) Pain assessment performed.  Patient reports chronic neck & back pain. Pain rating is on a Pain Scale of 0 - 10. Currently using Ibuprofen 600 mg PRN during the day and Tramadol at night with fair relief - able to sleep only 2 hours at a stretch.  Pain interferes with sleep due to staying in same position for 2 hours.  Per patient, his pain is either triggered by staying in same position for a prolonged period of time or by moving too much. Medications reviewed.  Reviewed provider established plan for pain management; Discussed importance of adherence to all scheduled medical appointments; Patient continues to report having Neuropathy on his left side of his body (arm and leg) causing numbness and lack of coordination with movement.    Will continue to monitor.     Patient Goals/Self-Care Activities: Patient will self administer medications as prescribed as evidenced by self report/primary caregiver report  Patient will attend all scheduled provider appointments as evidenced by clinician review of documented attendance to scheduled appointments and patient/caregiver report Patient will call pharmacy for medication refills as evidenced by patient report and review of pharmacy fill history as appropriate Patient will continue to perform ADL's independently as evidenced by patient/caregiver report Patient will continue  to perform IADL's independently as evidenced by patient/caregiver report Patient will call provider office for new concerns or questions as evidenced by review of documented incoming telephone call notes and patient report Patient will work with BSW to address care coordination needs and will continue to work with the clinical team to address health care and disease management related needs as evidenced by documented adherence to scheduled care management/care coordination appointments -  check blood sugar at prescribed times: twice daily - enter blood sugar readings and medication or insulin into daily log - take the blood sugar log to all doctor visits - check blood pressure weekly - write blood pressure results in a log or diary - keep a blood pressure log - take blood pressure log to all doctor appointments - call doctor for signs and symptoms of high blood pressure - keep all doctor appointments - take medications for blood pressure exactly as prescribed

## 2022-04-01 LAB — BCR-ABL1, CML/ALL, PCR, QUANT: Interpretation (BCRAL):: NEGATIVE

## 2022-04-03 ENCOUNTER — Other Ambulatory Visit (HOSPITAL_COMMUNITY): Payer: Self-pay

## 2022-04-03 ENCOUNTER — Ambulatory Visit (INDEPENDENT_AMBULATORY_CARE_PROVIDER_SITE_OTHER): Payer: Medicaid Other | Admitting: Internal Medicine

## 2022-04-03 ENCOUNTER — Encounter: Payer: Self-pay | Admitting: Internal Medicine

## 2022-04-03 VITALS — BP 134/88 | HR 89 | Ht 75.0 in | Wt 236.0 lb

## 2022-04-03 DIAGNOSIS — E1165 Type 2 diabetes mellitus with hyperglycemia: Secondary | ICD-10-CM | POA: Diagnosis not present

## 2022-04-03 DIAGNOSIS — E785 Hyperlipidemia, unspecified: Secondary | ICD-10-CM

## 2022-04-03 MED ORDER — DAPAGLIFLOZIN PROPANEDIOL 5 MG PO TABS
5.0000 mg | ORAL_TABLET | Freq: Every day | ORAL | 3 refills | Status: DC
  Filled 2022-04-03: qty 30, 30d supply, fill #0
  Filled 2022-05-07: qty 90, 90d supply, fill #0
  Filled 2022-06-18: qty 30, 30d supply, fill #0

## 2022-04-03 MED ORDER — LANTUS SOLOSTAR 100 UNIT/ML ~~LOC~~ SOPN: 7.0000 [IU] | PEN_INJECTOR | SUBCUTANEOUS | 3 refills | Status: DC

## 2022-04-03 NOTE — Progress Notes (Signed)
Patient ID: Gerald Olson, male   DOB: 1965/12/29, 56 y.o.   MRN: [id number]  HPI: Gerald Olson is a 56 y.o.-year-old male, returning for follow-up for DM2 dx in 2019, with prediabetes 2006, insulin-dependent since 2020, uncontrolled, with complications (peripheral artery disease, diabetic retinopathy, peripheral neuropathy). Pt. previously saw Dr. Loanne [name], last visit 3 months ago.  Reviewed HbA1c: Lab Results  Component Value Date   HGBA1C 6.1 (A) 01/16/2022   HGBA1C 6.0 (A) 12/26/2021   HGBA1C 6.0 (A) 09/22/2021   HGBA1C 5.9 (A) 06/19/2021   HGBA1C 5.9 (A) 04/19/2021   HGBA1C 7.2 (H) 01/18/2021   HGBA1C 7.0 (A) 09/05/2020   HGBA1C 7.4 (H) 06/14/2020   HGBA1C 7.3 (A) 05/04/2020   HGBA1C 7.8 (A) 02/11/2020   Pt is on a regimen of: - Lantus 7 units in  am 6/7 days  Pt checks his sugars ~1x a day and they are: - am: 120-145 - 2h after b'fast: n/c - before lunch: n/c - 2h after lunch: n/c - before dinner: 160-190 - 2h after dinner: n/c - bedtime: n/c - nighttime: n/c Lowest sugar was 110; he has hypoglycemia awareness at 90.  Highest sugar was 190.  Glucometer: Accu-Chek guide  Pt's meals are: - Breakfast: none - Lunch: none - Dinner: canned soup, hamburger helper or other meat, rice, beans, corn - Snacks: tea with stevia Drinks 2 beers a day.  - + CKD - saw [facility], last BUN/creatinine:  Lab Results  Component Value Date   BUN 14 03/26/2022   BUN 23 (H) 12/13/2021   CREATININE 0.93 03/26/2022   CREATININE 1.02 12/13/2021    Ref Range & Units 07/13/2021  Creatinine, Ur 20 - 320 mg/dL 189   Urine Protein/Creatinine Ratio 25 - 148 mg/g creat 407 High    Protein/Creatinine Ratio, Urine 0.025 - 0.148 mg/mg creat 0.407 High    Protein Urine Random 5 - 25 mg/dL 77 High    On lisinopril 10 mg daily.  -+ Dyslipidemia; last set of lipids: Lab Results  Component Value Date   CHOL 122 06/14/2020   HDL 34 (L) 06/14/2020   LDLCALC 72 06/14/2020   TRIG 77  06/14/2020   CHOLHDL 3.6 06/14/2020  He is not on a statin.  - last eye exam was on 10/27/2021. + NPDR OU, without macular edema.  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  - + numbness and tingling in his feet.  Last foot exam 06/19/2021. He is taking collagen + peptides + retinol. He also takes B vitamins with Benfotiamine for neuropathy.  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  Previously on a low carb unprocessed diet.  ROS: + see HPI No increased urination, blurry vision, nausea, chest pain. He has severe back pain.  Past Medical History:  Diagnosis Date   ADHD (attention deficit hyperactivity disorder)    Diagnosed during teenage years   Alcohol use disorder    On average 14-21 drinks per week   Alcoholic cirrhosis of liver    Per patient - condition is not related to moderate alcohol use but rather prolonged NSAID use and recent abuse.   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 [place]   Major depressive disorder    Other spondylosis with radiculopathy, cervical region    Retinopathy    Right eye; per patient report   Type 2 diabetes mellitus with hyperglycemia    Past Surgical History:  Procedure Laterality Date   CHOLECYSTECTOMY N/A 06/02/2018   Procedure: LAPAROSCOPIC CHOLECYSTECTOMY;  Surgeon: Vickie Epley, MD;  Location: [facility] ORS;  Service: General;  Laterality: N/A;   KNEE ARTHROSCOPY     30 Years ago   lumbar five-sacral one posterior lumbar interbody fusion  01/24/2021   Social  History   Socioeconomic History   Marital status: Single    Spouse name: Not on file   Number of children: Not on file   Years of education: 18   Highest education level: Master's degree (e.g., MA, MS, MEng, MEd, MSW, MBA)  Occupational History   Not on file  Tobacco Use   Smoking status: Former    Types: Cigarettes    Quit date: 06/25/2003    Years since quitting: 18.7   Smokeless tobacco: Never   Tobacco comments:    Currently vapes  Vaping Use   Vaping Use: Every day  Substance and Sexual Activity   Alcohol use: Yes    Alcohol/week: 14.0 - 21.0 standard drinks of alcohol    Types: 14 - 21 Cans of beer per week    Comment: 2-3 beers per night on average; sometimes up to 6   Drug use: Yes    Types: Marijuana    Comment: 1/week to help sleep; edibles, does not smoke   Sexual activity: Yes  Other Topics Concern   Not on file  Social History Narrative   Not on file   Social Determinants of Health   Financial Resource Strain: High Risk (05/09/2021)   Overall Financial Resource Strain (CARDIA)    Difficulty of Paying Living Expenses: Hard  Food Insecurity: No Food Insecurity (05/09/2021)   Hunger Vital Sign    Worried About Running Out of Food in the Last Year: Never true    Ran Out of Food in the Last Year: Never true  Transportation Needs: No Transportation Needs (03/28/2022)   PRAPARE - Hydrologist (Medical): No    Lack of Transportation (Non-Medical): No  Physical Activity: Insufficiently Active (05/09/2021)   Exercise Vital Sign    Days of Exercise per Week: 3 days    Minutes of Exercise per Session: 10 min  Stress: Stress Concern Present (07/03/2021)   [place]    Feeling of Stress : Very much  Social Connections: Socially Isolated (05/09/2021)   Social Connection and Isolation Panel [NHANES]    Frequency of Communication with Friends and Family: More than three times a  week    Frequency of Social Gatherings with Friends and Family: More than three times a week    Attends Religious Services: Never    Marine scientist or Organizations: No    Attends Archivist Meetings: Never    Marital Status: Never married  Intimate Partner Violence: Not At Risk (03/28/2022)   Humiliation, Afraid, Rape, and Kick questionnaire    Fear of Current or Ex-Partner: No    Emotionally Abused: No    Physically Abused: No  Sexually Abused: No   Current Outpatient Medications on File Prior to Visit  Medication Sig Dispense Refill   Blood Pressure Monitoring (BLOOD PRESSURE MONITOR AUTOMAT) DEVI Check blood pressure in AM and PM daily 1 each 0   glucose blood (ACCU-CHEK GUIDE) test strip USE AS DIRECTED 2 TIMES DAILY 180 strip 0   ibuprofen (IBU) 800 MG tablet Take 1 tablet every 8 hours as needed for pain 20 tablet 0   imatinib (GLEEVEC) 400 MG tablet Take 1 tablet (400 mg total) by mouth daily. 30 tablet 5   insulin glargine (LANTUS SOLOSTAR) 100 UNIT/ML Solostar Pen Inject 3 Units into the skin every morning. (Patient taking differently: Inject 7 Units into the skin every morning. 7 units 6 days a week) 15 mL 0   Insulin Pen Needle (UNIFINE PENTIPS) 32G X 4 MM MISC Use to inject Victoza once a day 100 each 11   lisinopril (ZESTRIL) 10 MG tablet Take 1 tablet by mouth daily. 90 tablet 3   traMADol (ULTRAM) 50 MG tablet Take 1 tablet (50 mg total) by mouth 3 (three) times daily as needed for severe pain. 30 tablet 0   [DISCONTINUED] DULoxetine (CYMBALTA) 60 MG capsule Take 1 capsule (60 mg total) by mouth 2 (two) times daily. (Patient not taking: No sig reported) 60 capsule 1   No current facility-administered medications on file prior to visit.   No Known Allergies Family History  Problem Relation Age of Onset   Aneurysm Father    Drug abuse Sister    Multiple sclerosis Sister    Suicidality Maternal Aunt    Emphysema Maternal Grandfather    Diabetes Neg Hx     PE: BP 134/88 (BP Location: Left Arm, Patient Position: Sitting, Cuff Size: Small)   Pulse 89   Ht 6' 3" (1.905 m)   Wt 236 lb (107 kg)   SpO2 96%   BMI 29.50 kg/m  Wt Readings from Last 3 Encounters:  04/03/22 236 lb (107 kg)  03/07/22 238 lb (108 kg)  01/16/22 226 lb 3.2 oz (102.6 kg)   Constitutional: overweight, in NAD Eyes: no exophthalmos ENT: moist mucous membranes, no thyromegaly, no cervical lymphadenopathy Cardiovascular: RRR, No MRG Respiratory: CTA B Musculoskeletal: no deformities Skin: moist, warm, no rashes Neurological: no tremor with outstretched hands Ridged, atrophic halluceal toenail - L foot   ASSESSMENT: 1. DM2, insulin-dependent, uncontrolled, with complications - PAD - DR - PN  2.  Dyslipidemia  PLAN:  1. Patient with long-standing, uncontrolled diabetes, on oral antidiabetic regimen, with good control.  Latest HbA1c obtained 3 months ago was 6.1%.  At today's visit, HbA1c is 6.6% (higher). - At this visit, he tells me that he relaxed his diet in the last year.  Previously, he was eating more unprocessed foods and avoiding carbs.  As of now, he is drinking unsweetened tea + Stevia throughout the day and is now mainly eating 1 meal a day, dinner.  He is now eating more processed foods.  Also, he drinks 2 beers a day, as otherwise, he feels that his blood pressure and blood sugars are higher.  -His sugars are higher lately.  Reviewing his blood sugars at home, they appear to be at or higher than target in the morning and quite high before dinner. -At today's visit we discussed about increasing the Lantus frequency to take it every day, rather than 6 out of 7 days.  However, I feel that this is not enough to control his blood sugars  throughout the day.  We need better mealtime coverage.  Due to his history of chronic kidney disease, I suggested an SGLT2 inhibitor.  He tried to obtain this before through the patient assistance program, but this did not  work, despite the fact that he met the criteria.  Will retry [facility] -I sent the prescription to his pharmacy.  If this is not covered, will need to go to the patient assistance program.  - I suggested to:  Patient Instructions  Please increase: - Lantus 7 units daily in am  Add: - Farxiga 5 mg daily in am  Please return in 3 months with your sugar log.   - check sugars at different times of the day - check 1x a day, rotating checks - discussed about CBG targets for treatment: 80-130 mg/dL before meals and <180 mg/dL after meals; target HbA1c <7%. - given foot care handout  - given instructions for hypoglycemia management "15-15 rule"  - advised for yearly eye exams  - at today's visit, we wanted me to look at his left halluceal toenail.  This appears to be atrophic, with ridges, but new nail is growing in the nailbed.  - Return to clinic in 3 mo with sugar log   2.  Dyslipidemia - Reviewed latest lipid panel from 2021: LDL above our target of less than 55 due to cardiovascular disease: Lab Results  Component Value Date   CHOL 122 06/14/2020   HDL 34 (L) 06/14/2020   LDLCALC 72 06/14/2020   TRIG 77 06/14/2020   CHOLHDL 3.6 06/14/2020  -He is not on a statin. -He is due for another lipid panel -we will check this today  - Total time spent for the visit: 40 min, in precharting, reviewing Dr. Cordelia Pen last note, obtaining medical information from the chart and from the pt, reviewing his  previous labs, evaluations, and treatments, reviewing his symptoms, counseling him about his diabetes (please see the discussed topics above), and developing a plan to further treat it; he had a number of questions which I addressed.  [name], MD PhD [facility]

## 2022-04-03 NOTE — Patient Instructions (Signed)
Please increase: - Lantus 7 units daily in am  Add: - Farxiga 5 mg daily in am  Please return in 3 months with your sugar log.   PATIENT INSTRUCTIONS FOR TYPE 2 DIABETES:  **Please join MyChart!** - see attached instructions about how to join if you have not done so already.  DIET AND EXERCISE Diet and exercise is an important part of diabetic treatment.  We recommended aerobic exercise in the form of brisk walking (working between 40-60% of maximal aerobic capacity, similar to brisk walking) for 150 minutes per week (such as 30 minutes five days per week) along with 3 times per week performing 'resistance' training (using various gauge rubber tubes with handles) 5-10 exercises involving the major muscle groups (upper body, lower body and core) performing 10-15 repetitions (or near fatigue) each exercise. Start at half the above goal but build slowly to reach the above goals. If limited by weight, joint pain, or disability, we recommend daily walking in a swimming pool with water up to waist to reduce pressure from joints while allow for adequate exercise.    BLOOD GLUCOSES Monitoring your blood glucoses is important for continued management of your diabetes. Please check your blood glucoses 2-4 times a day: fasting, before meals and at bedtime (you can rotate these measurements - e.g. one day check before the 3 meals, the next day check before 2 of the meals and before bedtime, etc.).   HYPOGLYCEMIA (low blood sugar) Hypoglycemia is usually a reaction to not eating, exercising, or taking too much insulin/ other diabetes drugs.  Symptoms include tremors, sweating, hunger, confusion, headache, etc. Treat IMMEDIATELY with 15 grams of Carbs: 4 glucose tablets  cup regular juice/soda 2 tablespoons raisins 4 teaspoons sugar 1 tablespoon honey Recheck blood glucose in 15 mins and repeat above if still symptomatic/blood glucose <100.  RECOMMENDATIONS TO REDUCE YOUR RISK OF DIABETIC  COMPLICATIONS: * Take your prescribed MEDICATION(S) * Follow a DIABETIC diet: Complex carbs, fiber rich foods, (monounsaturated and polyunsaturated) fats * AVOID saturated/trans fats, high fat foods, >2,300 mg salt per day. * EXERCISE at least 5 times a week for 30 minutes or preferably daily.  * DO NOT SMOKE OR DRINK more than 1 drink a day. * Check your FEET every day. Do not wear tightfitting shoes. Contact us if you develop an ulcer * See your EYE doctor once a year or more if needed * Get a FLU shot once a year * Get a PNEUMONIA vaccine once before and once after age 56 years  GOALS:  * Your Hemoglobin A1c of <7%  * fasting sugars need to be <130 * after meals sugars need to be <180 (2h after you start eating) * Your Systolic BP should be 811 or lower  * Your Diastolic BP should be 80 or lower  * Your HDL (Good Cholesterol) should be 40 or higher  * Your LDL (Bad Cholesterol) should be 100 or lower. * Your Triglycerides should be 150 or lower  * Your Urine microalbumin (kidney function) should be <30 * Your Body Mass Index should be 25 or lower    Please consider the following ways to cut down carbs and fat and increase fiber and micronutrients in your diet: - substitute whole grain for white bread or pasta - substitute brown rice for white rice - substitute 90-calorie flat bread pieces for slices of bread when possible - substitute sweet potatoes or yams for white potatoes - substitute humus for margarine - substitute tofu for  cheese when possible - substitute almond or rice milk for regular milk (would not drink soy milk daily due to concern for soy estrogen influence on breast cancer risk) - substitute dark chocolate for other sweets when possible - substitute water - can add lemon or orange slices for taste - for diet sodas (artificial sweeteners will trick your body that you can eat sweets without getting calories and will lead you to overeating and weight gain in the long  run) - do not skip breakfast or other meals (this will slow down the metabolism and will result in more weight gain over time)  - can try smoothies made from fruit and almond/rice milk in am instead of regular breakfast - can also try old-fashioned (not instant) oatmeal made with almond/rice milk in am - order the dressing on the side when eating salad at a restaurant (pour less than half of the dressing on the salad) - eat as little meat as possible - can try juicing, but should not forget that juicing will get rid of the fiber, so would alternate with eating raw veg./fruits or drinking smoothies - use as little oil as possible, even when using olive oil - can dress a salad with a mix of balsamic vinegar and lemon juice, for e.g. - use agave nectar, stevia sugar, or regular sugar rather than artificial sweateners - steam or broil/roast veggies  - snack on veggies/fruit/nuts (unsalted, preferably) when possible, rather than processed foods - reduce or eliminate aspartame in diet (it is in diet sodas, chewing gum, etc) Read the labels!  Try to read Dr. Janene Harvey book: "Program for Reversing Diabetes" for other ideas for healthy eating.

## 2022-04-04 LAB — POCT GLYCOSYLATED HEMOGLOBIN (HGB A1C): Hemoglobin A1C: 6.6 % — AB (ref 4.0–5.6)

## 2022-04-04 NOTE — Addendum Note (Signed)
Addended by: Jefferson Fuel on: 04/04/2022 07:07 AM   Modules accepted: Orders

## 2022-04-09 ENCOUNTER — Other Ambulatory Visit: Payer: Self-pay | Admitting: Obstetrics and Gynecology

## 2022-04-09 NOTE — Patient Outreach (Signed)
Care Coordination  04/09/2022  Gerald Olson June 07, 1966 [id number]  My Chart message sent responding to patient.  Aida Raider RN, BSN   [facility] - Managed Medicaid High Risk [phone]

## 2022-04-25 ENCOUNTER — Other Ambulatory Visit (HOSPITAL_COMMUNITY): Payer: Self-pay

## 2022-05-01 ENCOUNTER — Other Ambulatory Visit (HOSPITAL_COMMUNITY): Payer: Self-pay

## 2022-05-03 ENCOUNTER — Other Ambulatory Visit (HOSPITAL_COMMUNITY): Payer: Self-pay

## 2022-05-07 ENCOUNTER — Other Ambulatory Visit (HOSPITAL_COMMUNITY): Payer: Self-pay

## 2022-05-08 ENCOUNTER — Other Ambulatory Visit (HOSPITAL_COMMUNITY): Payer: Self-pay

## 2022-05-08 ENCOUNTER — Other Ambulatory Visit: Payer: Self-pay

## 2022-05-18 ENCOUNTER — Encounter: Payer: Self-pay | Admitting: Physical Medicine & Rehabilitation

## 2022-05-18 ENCOUNTER — Encounter: Payer: Medicaid Other | Attending: Physical Medicine & Rehabilitation | Admitting: Physical Medicine & Rehabilitation

## 2022-05-18 VITALS — BP 147/103 | HR 97 | Ht 75.0 in | Wt 231.2 lb

## 2022-05-18 DIAGNOSIS — M47812 Spondylosis without myelopathy or radiculopathy, cervical region: Secondary | ICD-10-CM | POA: Diagnosis not present

## 2022-05-18 DIAGNOSIS — M47816 Spondylosis without myelopathy or radiculopathy, lumbar region: Secondary | ICD-10-CM | POA: Insufficient documentation

## 2022-05-18 DIAGNOSIS — E1142 Type 2 diabetes mellitus with diabetic polyneuropathy: Secondary | ICD-10-CM | POA: Insufficient documentation

## 2022-05-18 NOTE — Progress Notes (Unsigned)
Subjective:    Patient ID: Gerald Olson, male    DOB: Nov 23, 1965, 56 y.o.   MRN: [id number]  HPI 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  Patient reports he had back and neck pain for many years with shooting pain down both legs and altered sensation in his feet.  He also developed diabetic polyneuropathy when he feels like this is his greatest area of pain at this time.  He has chronic burning sensations in his feet that is very severe and has not responded well to oral medications.  Reports his lower back pain and sciatica improved after having his lumbar surgery completed however he continues to have neck pain and he then developed worsening pain in his thoracic spine.  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  His back feels stiff throughout the day and impairs his activities. He reports he has failed multiple medications for his  neuropathy including Topamax, gabapentin, amitriptyline, Cymbalta.  He is not sure if he has used Lyrica in the past.  He uses a natural treatments are better for him and was interested in [place].  He has used tramadol with benefit to his lower back pain, taking it mostly at night because he does not want to have any impairment during the day.  He does take ibuprofen for his back pain with some benefit.  Chiropractic care provided mild benefit to his back pain in the past.  He reports physical therapy did not help and worsened his back pain.  Patient does take occasional cannabis extract for his pain.  He also drinks alcohol 2-3 drinks regularly.  THC, ETOH? Duloxetine? Tramadol Diabetic neuropathy Both feet  Canabis extract Burning and numbness  Gabapentin, amitrip, cymbalta-didn't help,Topamax, feet asleep Leukemia Siatic better- both  legs Tramadol - helps at night PT- didn't help, makes pain worse Chiro-helped in past Numb b/l feet below knee DISH Left side worse T and C spine paraspinal Fiar faber negative Decreased s pine motion  Stiff back Slr positive? Epidural injection didn't help Decreased DTR legs Qutenza, lyrica if not approved TCH product   Pain Inventory Average Pain 7 Pain Right Now 8 My pain is constant, sharp, burning, dull, stabbing, tingling, aching, and numbness  In the last 24 hours, has pain interfered with the following? General activity 9 Relation with others 9 Enjoyment of life 10 What TIME of day is your pain at its worst? morning , daytime, evening, and night Sleep (in general) Poor  Pain is worse with: walking, bending, sitting, and standing Pain improves with: heat/ice and TENS Relief from Meds: 5  walk without assistance walk with assistance use a cane how many minutes can you walk? 5-30 ability to climb steps?  yes do you drive?  yes  disabled: date disabled 2019 I need assistance with the following:  household duties  weakness numbness tingling trouble walking spasms depression anxiety  Any changes since last visit?  no  Any changes since last visit?  no    Family History  Problem Relation Age of Onset   Aneurysm Father    Drug abuse Sister    Multiple sclerosis Sister    Suicidality Maternal Aunt    Emphysema Maternal Grandfather    Diabetes Neg Hx    Social History   Socioeconomic History  Marital status: Single    Spouse name: Not on file   Number of children: Not on file   Years of education: 84   Highest education level: Master's degree (e.g., MA, MS, MEng, MEd, MSW, MBA)  Occupational History   Not on file  Tobacco Use   Smoking status: Former    Types: Cigarettes    Quit date: 06/25/2003    Years since quitting: 18.9   Smokeless tobacco: Never   Tobacco comments:    Currently vapes  Vaping Use   Vaping Use: Every day   Substance and Sexual Activity   Alcohol use: Yes    Alcohol/week: 14.0 - 21.0 standard drinks of alcohol    Types: 14 - 21 Cans of beer per week    Comment: 2-3 beers per night on average; sometimes up to 6   Drug use: Yes    Types: Marijuana    Comment: 1/week to help sleep; edibles, does not smoke   Sexual activity: Yes  Other Topics Concern   Not on file  Social History Narrative   Not on file   Social Determinants of Health   Financial Resource Strain: High Risk (05/09/2021)   Overall Financial Resource Strain (CARDIA)    Difficulty of Paying Living Expenses: Hard  Food Insecurity: No Food Insecurity (05/09/2021)   Hunger Vital Sign    Worried About Running Out of Food in the Last Year: Never true    Ran Out of Food in the Last Year: Never true  Transportation Needs: No Transportation Needs (03/28/2022)   PRAPARE - Hydrologist (Medical): No    Lack of Transportation (Non-Medical): No  Physical Activity: Insufficiently Active (05/09/2021)   Exercise Vital Sign    Days of Exercise per Week: 3 days    Minutes of Exercise per Session: 10 min  Stress: Stress Concern Present (07/03/2021)   [place]    Feeling of Stress : Very much  Social Connections: Socially Isolated (05/09/2021)   Social Connection and Isolation Panel [NHANES]    Frequency of Communication with Friends and Family: More than three times a week    Frequency of Social Gatherings with Friends and Family: More than three times a week    Attends Religious Services: Never    Marine scientist or Organizations: No    Attends Archivist Meetings: Never    Marital Status: Never married   Past Surgical History:  Procedure Laterality Date   CHOLECYSTECTOMY N/A 06/02/2018   Procedure: LAPAROSCOPIC CHOLECYSTECTOMY;  Surgeon: Vickie Epley, MD;  Location: [facility] ORS;  Service: General;  Laterality: N/A;   KNEE  ARTHROSCOPY     30 Years ago   lumbar five-sacral one posterior lumbar interbody fusion  01/24/2021   Past Medical History:  Diagnosis Date   ADHD (attention deficit hyperactivity disorder)    Diagnosed during teenage years   Alcohol use disorder    On average 14-21 drinks per week   Alcoholic cirrhosis of liver    Per patient - condition is not related to moderate alcohol use but rather prolonged NSAID use and recent abuse.   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 [place]   Major depressive disorder    Other spondylosis with radiculopathy, cervical region    Retinopathy    Right eye; per patient report   Type 2 diabetes mellitus with hyperglycemia    BP (!) 147/103   Pulse 97   Ht '6\' 3"'$  (1.905 m)   Wt 231 lb 3.2 oz (104.9 kg)   SpO2 95%   BMI 28.90 kg/m   Opioid Risk Score:   Fall Risk Score:  `1  Depression screen [facility] 2/9     05/18/2022    2:43 PM 10/17/2021    1:17 PM 09/20/2021    3:27 PM 06/20/2021    1:28 PM 05/26/2021    3:22 PM 05/24/2021   10:04 AM 02/07/2021    4:23 PM  Depression screen PHQ 2/9  Decreased Interest '3  3 3 3 2 2  '$ Down, Depressed, Hopeless '3  3 2 2 1 1  '$ PHQ - 2 Score '6  6 5 5 3 3  '$ Altered sleeping '3  3 3 3 1 '$ 0  Tired, decreased energy '3  3 3 3 '$ 0 0  Change in appetite '3  3 2 1 '$ 0 0  Feeling bad or failure about yourself  '3  3 1 3 1 1  '$ Trouble concentrating '2  3 1 2 '$ 0 0  Moving slowly or fidgety/restless 1  0 0 0  1  Suicidal thoughts 0  1 0 0 0 0  PHQ-9 Score '21  22 15 17 5 5  '$ Difficult doing work/chores Somewhat difficult  Not difficult at all Somewhat difficult Very difficult  Somewhat difficult Not difficult at all     Information is confidential and restricted. Go to Review Flowsheets to unlock data.     Review of Systems  Constitutional:  Positive for unexpected weight change.       Gain  HENT: Negative.    Eyes: Negative.   Respiratory: Negative.    Cardiovascular:  Positive for leg swelling.  Gastrointestinal:  Positive for diarrhea.  Endocrine:       High blood sugar  Genitourinary: Negative.   Musculoskeletal:  Positive for back pain and gait problem.  Skin: Negative.   Allergic/Immunologic: Negative.   Neurological:  Positive for weakness and numbness.       Tingling  Hematological: Negative.   Psychiatric/Behavioral:  Positive for dysphoric mood. The patient is nervous/anxious.   All other systems reviewed and are negative.      Objective:   Physical Exam  Gen: no distress, normal appearing HEENT: oral mucosa pink and moist, NCAT Cardio: Reg rate Chest: normal effort, normal rate of breathing Abd: soft, non-distended Ext: no edema Psych: pleasant, normal affect Skin: intact Neuro: Alert and oriented x4, follows commands, normal speech language, cranial nerves II through XII grossly intact  Sensation decreased to light touch in a stocking glove distribution in bilateral lower extremities below the knee musculoskeletal:   Decreased motion at the lumbar and cervical spine in all directions Tenderness throughout paraspinals but greater in the cervical thoracic spine compared to lumbar spine Strength 5 out of 5 in bilateral shoulder abduction, elbow flexion and extension, finger flexion Strength 5 out of 5 in bilateral hip flexion, knee extension, ankle PF and DF, he does report difficulty coordinating movements at his left ankle SLR equivocal on the right negative on  the left FABER and FADIR negative bilaterally Spurling's test negative bilaterally Facet loading negative   L spine 01/24/2021 Intraoperative images document  instrumented posterolateral and interbody PLIF L5-S1. The L5 pedicle screws are directed towards the superior endplate. Graft markers project in the interspace. Alignment preserved..   IMPRESSION: Limited intraoperative lateral view of the lumbar spine obtained for localization purposes    L spine MRI 11/22/2020 COMPARISON:  MRI lumbar spine July 20, 2018.   FINDINGS: Segmentation:  Standard.   Alignment:  Grade 1 anterolisthesis of L5 on S1, similar to prior.   Vertebrae: Vertebral body heights are maintained. No specific evidence of acute fracture, discitis/osteomyelitis, or suspicious bone lesion. Degenerative/discogenic endplate signal changes about the right eccentric L5-S1 disc.   Conus medullaris and cauda equina: Conus extends to the T12-L1 level. Conus appears normal.   Paraspinal and other soft tissues: Unremarkable.   Disc levels:   T12-L1: Only imaged sagittally. Disc bulging and facet hypertrophy without evidence of significant canal or foraminal stenosis.   L1-L2: Small broad disc bulge with small superimposed left subarticular disc protrusion. Mild facet hypertrophy. No significant canal or foraminal stenosis.   L2-L3: No significant disc protrusion, foraminal stenosis, or canal stenosis.   L3-L4: Similar left foraminal/far lateral disc protrusion which contacts the exiting/exited left L3 nerve root without displacement (series 8, image 20). No significant canal or foraminal stenosis.   L4-L5: Broad disc bulge with superimposed left foraminal disc protrusion with annular fissure. Mild bilateral subarticular recess stenosis. No significant central canal stenosis. Mild left foraminal stenosis, similar to prior. No significant right foraminal stenosis.   L5-S1: Grade 1 anterolisthesis of L5 on S1. Uncovering of the disc with superimposed disc bulge and bilateral foraminal disc protrusions. Severe bilateral facet arthropathy/hypertrophy. Similar right  greater than left subarticular recess stenosis with possible impingement of the descending right S1 nerve root. Similar moderate bilateral foraminal stenosis.   IMPRESSION: 1. Similar grade 1 anterolisthesis of L5 on S1 with right greater than left subarticular recess stenosis, possible impingement of the descending right S1 nerve root, and moderate right greater than left foraminal stenosis. 2. Similar disc bulge with annular fissure on the left at L4-L5 with mild left foraminal stenosis and mild bilateral subarticular recess stenosis. 3. Similar left foraminal/far lateral disc protrusion at L3-L4 which contacts the exiting/exited left L3 nerve root, unchanged.     C spine MRI 05/29/2020 FINDINGS: Alignment: Straightening of the normal cervical lordosis. One or 2 mm of degenerative anterolisthesis at C7-T1.   Vertebrae: No fracture or primary bone lesion.   Cord: No primary cord lesion.  See below regarding stenosis.   Posterior Fossa, vertebral arteries, paraspinal tissues: Negative   Disc levels:   Foramen magnum is widely patent. No significant finding at the C1-2 level.   C2-3: Endplate osteophytes and bulging of the disc. No compressive central canal stenosis. Mild foraminal narrowing on the right.   C3-4: Endplate osteophytes and bulging of the disc. AP diameter of the canal in the midline 9.3 mm. Bilateral foraminal encroachment right worse than left. Either C4 nerve could be affected.   C4-5: Endplate osteophytes and bulging of the disc. AP diameter of the canal in the midline 7.6 mm. Slight triangulation of the cord without abnormal cord signal. Bilateral bony foraminal stenosis could affect either C5 nerve.   C5-6: Spondylosis with endplate osteophytes and protruding disc material more prominent towards the left. AP diameter of the canal in the midline 6.8 mm. Effacement of the subarachnoid space and  some cord deformity particularly on the left. No conclusive  abnormal T2 signal within the cord. Bilateral foraminal stenosis could compress either or both C6 nerves.   C6-7: Endplate osteophytes and bulging of the disc. AP diameter of the canal in the midline 8.2 mm. No cord deformity. Bilateral foraminal stenosis left worse than right. Either C7 nerve could be affected, particularly the left.   C7-T1: Bilateral facet arthropathy with 1 or 2 mm of anterolisthesis. No disc pathology. No compressive canal stenosis. Bilateral foraminal stenosis could affect either C8 nerve. Edematous change of the facet joints could certainly relate to regional pain.   IMPRESSION: C2-3: Mild foraminal narrowing on the right.   C3-4: Spondylosis with bilateral foraminal narrowing right worse than left. Either C4 nerve could be affected.   C4-5: Spondylosis with bilateral foraminal narrowing. Either C5 nerve could be affected. Canal narrowing with AP diameter of 7.6 mm. Slight deformity of the cord without abnormal cord signal.   C5-6: Spondylosis with bilateral foraminal narrowing. Findings more pronounced on the left. Either C6 nerve could be affected. Canal narrowing worse on the left with deformity of the cord. I do not see conclusive T2 signal within the cord. AP diameter of the canal in the midline only 6.8 mm. The patient could be at risk of developing myelopathy at this level.   C6-7: Spondylosis with bilateral foraminal narrowing. Either C7 nerve could be affected. Somewhat worse on the left.   C7-T1: Facet arthropathy with 1 or 2 mm of anterolisthesis and edematous change. Bilateral foraminal stenosis could affect either C8 nerve. The facet arthropathy is likely painful.   Assessment & Plan:  Diabetic polyneuropathy due to diabetes mellitus -Discussed risks and benefits of Qutenza, patient is interested in trying this medication. -Lyrica may be an option to try, patient says he will consider this if Qutenza is not an option or does not improve  his pain significantly -Discussed importance of blood glucose control to prevent worsening of his polyneuropathy  Back and neck pain.  Imaging has shown lumbar and cervical spondylosis and he is status post PLIF L5-S1.  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  -It does appear that he has had benefit from tramadol however we will not initiate today due to current use of THC products and alcohol, discussed cessation of this products. -Lyrica may also provide benefit for his back pain, will discuss further at future visit -Discussed limiting use of ibuprofen due to kidney, was GI, cardiovascular risks

## 2022-05-21 ENCOUNTER — Other Ambulatory Visit (HOSPITAL_COMMUNITY): Payer: Self-pay

## 2022-05-24 ENCOUNTER — Other Ambulatory Visit (HOSPITAL_COMMUNITY): Payer: Self-pay

## 2022-05-24 ENCOUNTER — Other Ambulatory Visit: Payer: Self-pay | Admitting: Physician Assistant

## 2022-05-24 DIAGNOSIS — M4317 Spondylolisthesis, lumbosacral region: Secondary | ICD-10-CM

## 2022-05-24 DIAGNOSIS — M545 Low back pain, unspecified: Secondary | ICD-10-CM

## 2022-05-26 ENCOUNTER — Encounter (HOSPITAL_COMMUNITY): Payer: Self-pay | Admitting: Pharmacist

## 2022-05-26 ENCOUNTER — Other Ambulatory Visit (HOSPITAL_COMMUNITY): Payer: Self-pay

## 2022-05-29 ENCOUNTER — Encounter: Payer: Self-pay | Admitting: Physician Assistant

## 2022-05-29 ENCOUNTER — Other Ambulatory Visit (HOSPITAL_BASED_OUTPATIENT_CLINIC_OR_DEPARTMENT_OTHER): Payer: Self-pay

## 2022-05-29 ENCOUNTER — Telehealth (INDEPENDENT_AMBULATORY_CARE_PROVIDER_SITE_OTHER): Payer: Medicaid Other | Admitting: Physician Assistant

## 2022-05-29 DIAGNOSIS — M4317 Spondylolisthesis, lumbosacral region: Secondary | ICD-10-CM | POA: Diagnosis not present

## 2022-05-29 NOTE — Assessment & Plan Note (Addendum)
Long discussion on appropriate and safe pain management. Advised tramadol is no longer an effective pain management tool for him, and based on his use, we will not continue to prescribe. Cautioned otc NSAID use, alcohol use, advised not to use medications that are not prescribed to him. Also advised not to used expired prescriptions.  At this time due to his risks of bleeding/GI complications and history of medication misuse, I am not comfortable prescribing further anti-inflammatories or pain meds. Advised he f/u with pain management or with his orthopedist/neurologist for other interventions. Offered additional referrals if needed.

## 2022-05-29 NOTE — Progress Notes (Signed)
I,Sha'taria Tyson,acting as a Education administrator for [employer], PA-C.,have documented all relevant documentation on the behalf of [name], PA-C,as directed by  [name], PA-C while in the presence of [name], PA-C.  MyChart Phone Visit   Virtual Visit via Phone Note   This visit type was conducted due to national recommendations for restrictions regarding the COVID-19 Pandemic (e.g. social distancing) in an effort to limit this patient's exposure and mitigate transmission in our community. This patient is at least at moderate risk for complications without adequate follow up. This format is felt to be most appropriate for this patient at this time. Physical exam was limited by quality of the audio technology used for the visit.   Patient location: home Provider location: [facility]  I discussed the limitations of evaluation and management by telemedicine and the availability of in person appointments. The patient expressed understanding and agreed to proceed.  Patient: Gerald Olson   DOB: 10-Dec-1965   56 y.o. Male  MRN: [id number] Visit Date: 05/29/2022  Today's healthcare provider: [name], PA-C   Cc. Chronic back pain  Subjective    HPI  Follow up for back pain  Pt was taking tramadol 50 mg up to TID for relief of back pain. He had a consultation w/ pain management, Dr Curlene Dolphin, 05/18/2022 was recommended Lyrica for pain relief. It was recommended he not continue tramadol d/t his use of ETOH and THC products.  It was also recommended he limit his use of ibuprofen.   Pt has been taking diclofenac pills from his mother; found some old celebrex and has been taking this as well. Pt has also been using a topical herbal supplement --wild lettuce or lactusa virosa.  He drinks 2-3 beers daily and uses PRN cannabis tincture.   Pt reports since being w/o Tramadol for 4 weeks his pain has increased. Pt reports when he did have on hand he was even  taking tramadol 3 times in a night, not waiting 6-8 hours between doses.  He states he would not take tramadol if he had drank w/in 4 hours, and instead would take a few aspirin.  Pt reports taking sister's Lyrica at one point which he felt didn't work.   -----------------------------------------------------------------------------------------  Medications: Outpatient Medications Prior to Visit  Medication Sig   Blood Pressure Monitoring (BLOOD PRESSURE MONITOR AUTOMAT) DEVI Check blood pressure in AM and PM daily   glucose blood (ACCU-CHEK GUIDE) test strip USE AS DIRECTED 2 TIMES DAILY   ibuprofen (IBU) 800 MG tablet Take 1 tablet every 8 hours as needed for pain   imatinib (GLEEVEC) 400 MG tablet Take 1 tablet (400 mg total) by mouth daily.   insulin glargine (LANTUS SOLOSTAR) 100 UNIT/ML Solostar Pen Inject 7 Units into the skin every morning.   Insulin Pen Needle (UNIFINE PENTIPS) 32G X 4 MM MISC Use to inject Victoza once a day   lisinopril (ZESTRIL) 10 MG tablet Take 1 tablet by mouth daily.   dapagliflozin propanediol (FARXIGA) 5 MG TABS tablet Take 1 tablet (5 mg total) by mouth daily before breakfast. (Patient not taking: Reported on 05/18/2022)   traMADol (ULTRAM) 50 MG tablet Take 1 tablet (50 mg total) by mouth 3 (three) times daily as needed for severe pain. (Patient not taking: Reported on 05/18/2022)   No facility-administered medications prior to visit.    Review of Systems  Constitutional:  Negative for fatigue and fever.  Respiratory:  Negative for cough and shortness of breath.  Cardiovascular:  Negative for chest pain, palpitations and leg swelling.  Neurological:  Negative for dizziness and headaches.    Objective    There were no vitals taken for this visit.  Physical Exam Psychiatric:        Speech: Speech is rapid and pressured and tangential.     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 Advised tramadol is no longer an effective pain management tool for him, and based on his use, we will not continue to prescribe. Cautioned otc NSAID use, alcohol use, advised not to use medications that are not prescribed to him. Also advised not to used expired prescriptions.  At this time due to his risks of bleeding/GI complications and history of medication misuse, I am not comfortable prescribing further anti-inflammatories or pain meds. Advised he f/u with pain management or with his orthopedist/neurologist for other interventions. Offered additional referrals if needed.      Return at next scheduled appt.    I discussed the assessment and treatment plan with the patient. The patient was provided an opportunity to ask questions and all were answered. The patient agreed with the plan and demonstrated an understanding of the instructions.   The patient was advised to call back or seek an in-person evaluation if the symptoms worsen or if the condition fails to improve as anticipated.  I provided 30 minutes of non-face-to-face time during this encounter.  I, [name], PA-C have reviewed all documentation for this visit. The documentation on  05/29/2022  for the exam, diagnosis, procedures, and orders are all accurate and complete.  [name], PA-C [facility] [address] [place], [place], [address] Office: [phone] Fax: [place]

## 2022-05-30 IMAGING — RF DG LUMBAR SPINE 2-3V
1 series · 2 of 2 positions shown · non-contrast
Comparison: MR 11/22/2020

CLINICAL DATA: Lumbar anterolisthesis

EXAM:
LUMBAR SPINE - 2-3 VIEW; DG C-ARM 1-60 MIN

[Series 1: run · 2 of 2 slices shown]
[im 1/2]
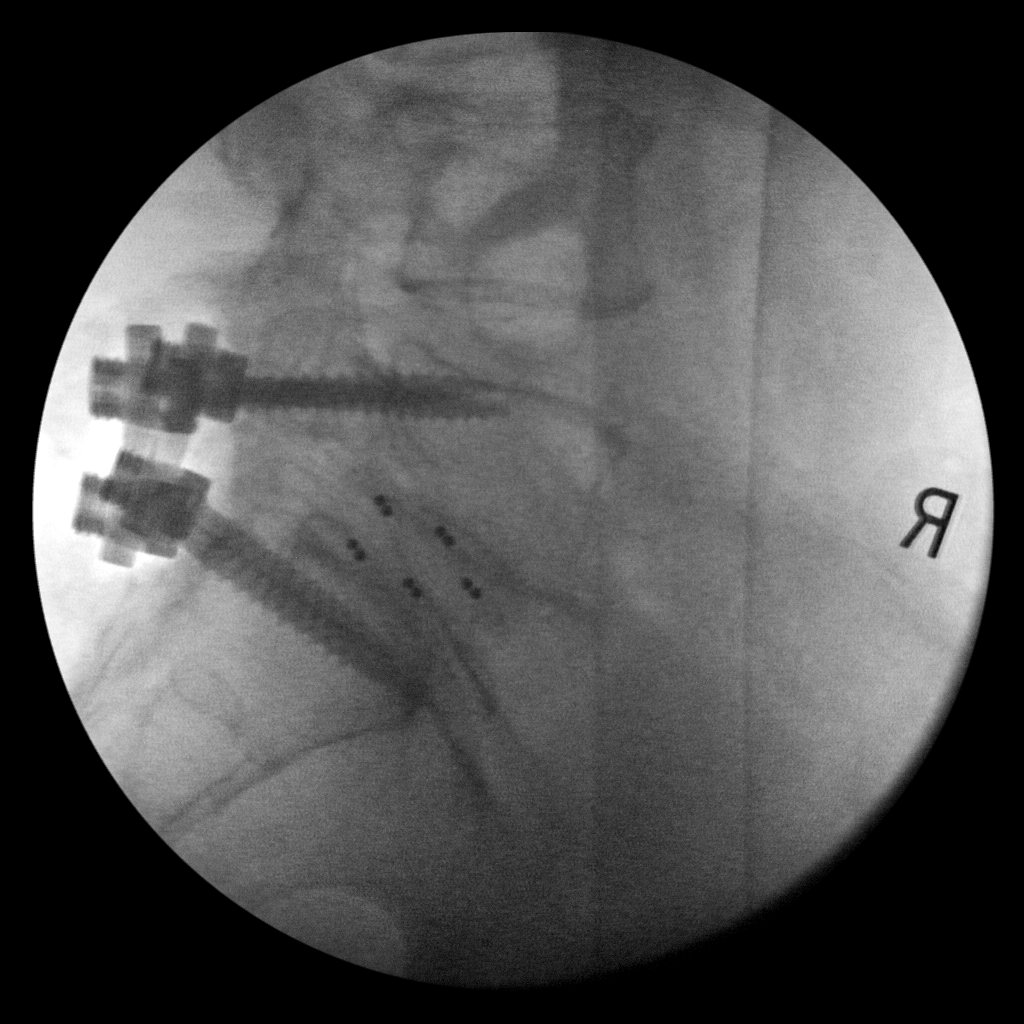
[im 2/2]
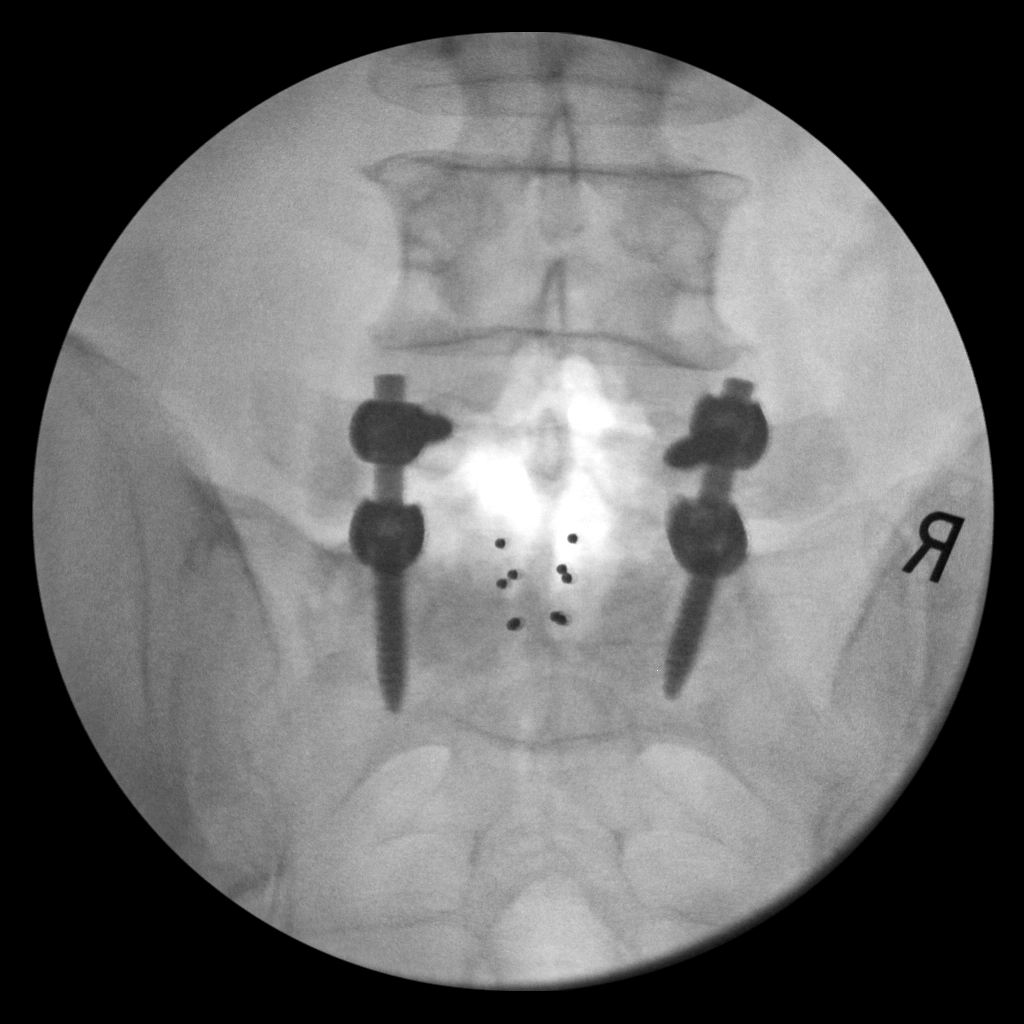

[2 of 2 positions shown; findings below may reference images not displayed]

FINDINGS: Intraoperative images document instrumented posterolateral and
interbody PLIF L5-S1. The L5 pedicle screws are directed towards the
superior endplate. Graft markers project in the interspace.
Alignment preserved.
IMPRESSION: PLIF L5-S1

## 2022-05-30 IMAGING — CR DG LUMBAR SPINE 1V
1 series · 1 of 1 positions shown · non-contrast
Comparison: MRI 11/22/2020

CLINICAL DATA: L5-S1 PLIF

EXAM:
LUMBAR SPINE - 1 VIEW

[lateral]
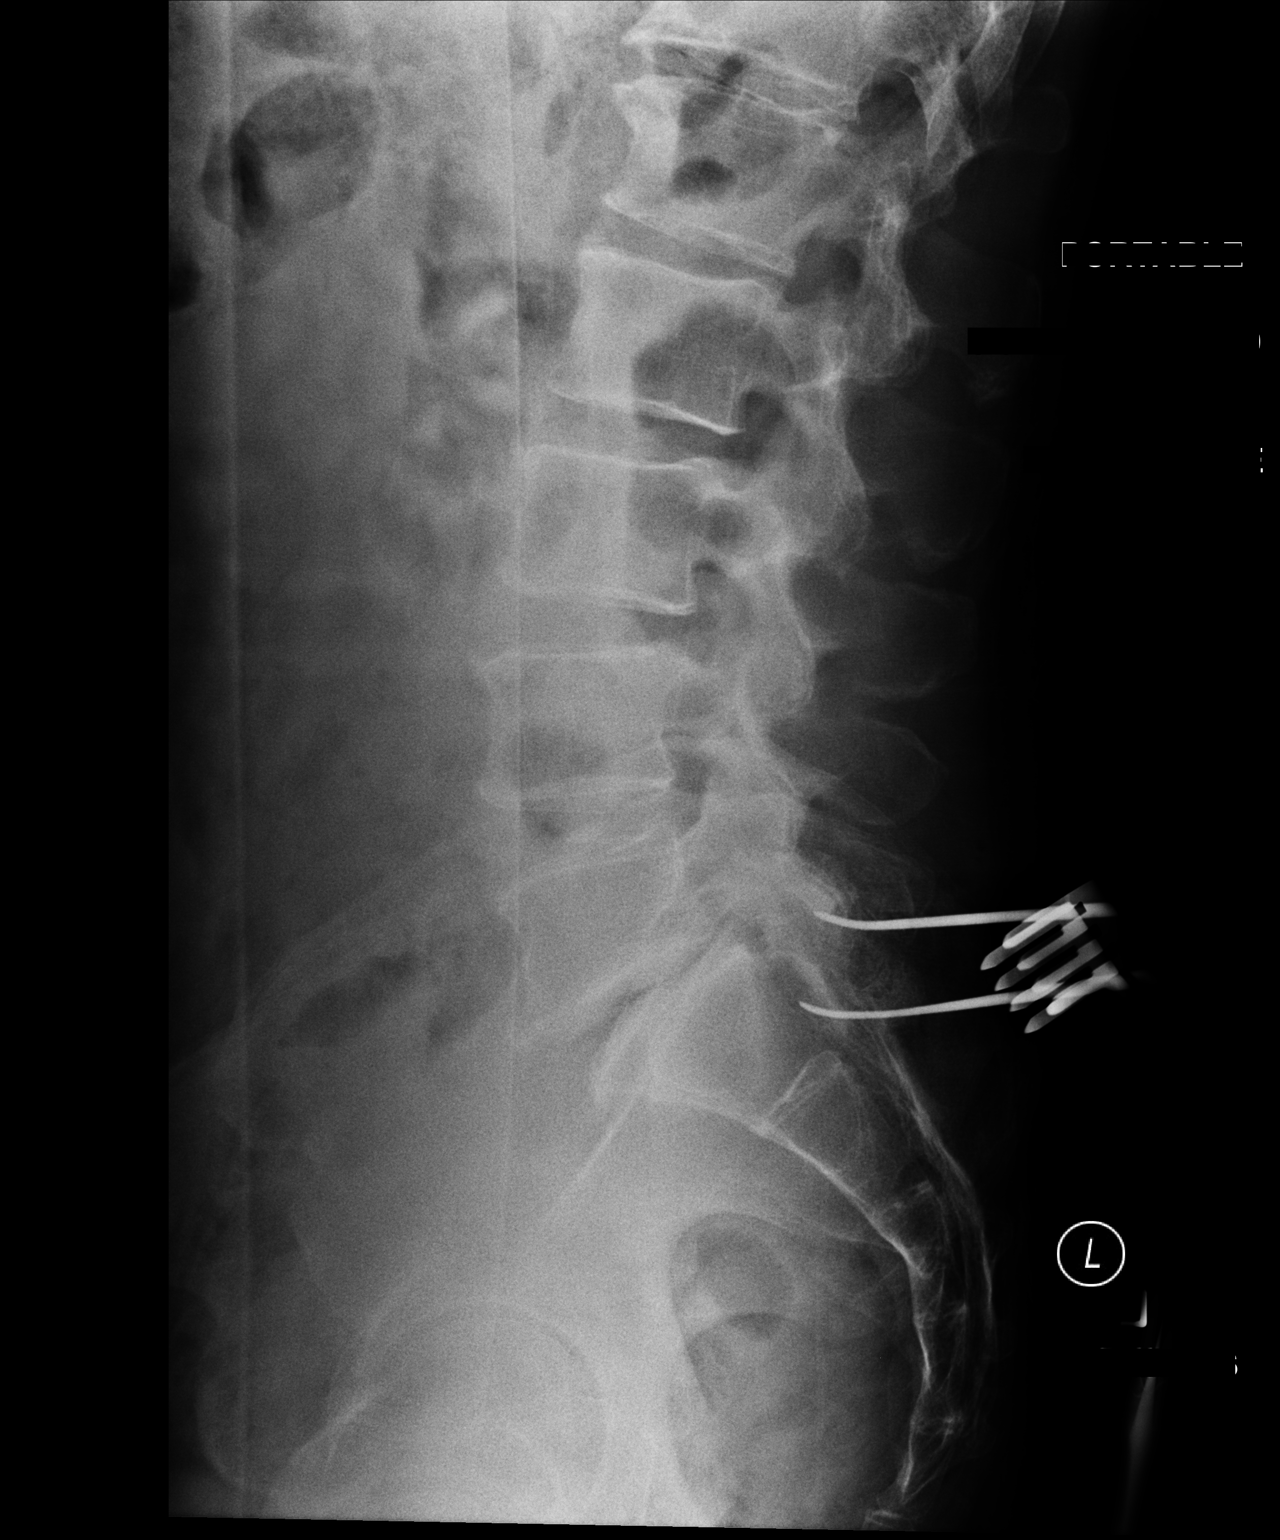

[1 of 1 positions shown; findings below may reference images not displayed]

FINDINGS: Limited intraoperative lateral view of the lumbar spine at [DATE].
Surgical instruments and localizing instruments overlie the
posterior elements at L5-S1 level with slightly more caudal
instrument posterior to the S1 vertebral body.
IMPRESSION: Limited intraoperative lateral view of the lumbar spine obtained for
localization purposes

## 2022-06-06 LAB — HM DIABETES EYE EXAM

## 2022-06-11 ENCOUNTER — Other Ambulatory Visit (HOSPITAL_COMMUNITY): Payer: Self-pay

## 2022-06-12 ENCOUNTER — Ambulatory Visit: Payer: Medicaid Other

## 2022-06-13 ENCOUNTER — Other Ambulatory Visit: Payer: Self-pay | Admitting: Obstetrics and Gynecology

## 2022-06-13 NOTE — Patient Instructions (Signed)
Hi Gerald Olson, thanks for speaking with me-I hope your appointment goes well this week.  Gerald Olson was given information about Medicaid Managed Care team care coordination services as a part of their Healthy [facility] Medicaid benefit. Gerald Olson verbally consented to engagement with the [facility] Managed Care team.   If you are experiencing a medical emergency, please call 911 or report to your local emergency department or urgent care.   If you have a non-emergency medical problem during routine business hours, please contact your provider's office and ask to speak with a nurse.   For questions related to your Healthy [facility] health plan, please call: [phone] or visit the homepage here: [email or web address]  If you would like to schedule transportation through your Healthy [facility] plan, please call the following number at least 2 days in advance of your appointment: [phone]  For information about your ride after you set it up, call Ride Assist at [phone]. Use this number to activate a Will Call pickup, or if your transportation is late for a scheduled pickup. Use this number, too, if you need to make a change or cancel a previously scheduled reservation.  If you need transportation services right away, call [phone]. The after-hours call center is staffed 24 hours to handle ride assistance and urgent reservation requests (including discharges) 365 days a year. Urgent trips include sick visits, hospital discharge requests and life-sustaining treatment.  Call the [place] at [phone], at any time, 24 hours a day, 7 days a week. If you are in danger or need immediate medical attention call 911.  If you would like help to quit smoking, call 1-800-QUIT-NOW (336) 685-8543) OR Espaol: 1-855-Djelo-Ya (3-151-761-6073) o para ms informacin haga clic aqu or Text READY to 200-400 to register via text  Mr. Mcelrath -  following are the goals we discussed in your visit today:   Goals Addressed             This Visit's Progress    RNCM - Chronic Back Pain Management       Timeframe:  Long-Range Goal Priority:  High Start Date:   05/09/2021                          Expected End Date: ongoing  Patient Goals: Patient will self administer medications as prescribed Patient will attend all scheduled provider appointments Patient will call pharmacy for medication refills Patient will continue to perform ADL's independently Patient will continue to perform IADL's independently Patient will call provider office for new concerns or questions Patient will work with BSW to address care coordination needs and will continue to work with the clinical team to address health care and disease management related needs.   Patient's pain will be managed to a tolerable pain level where patient can be functional with ADL's and IADL's.    06/13/22:      rates pain at a 7 most days-has appt with pain management this week          RNCM - CML Disease Progression Minimized or Managed       Timeframe:  Long-Range Goal Priority:  High Start Date:     05/09/2021                       Expected End Date: ongoing  Patient Goals: Patient will self administer medications as prescribed Patient will attend all scheduled provider appointments Patient will call pharmacy  for medication refills Patient will continue to perform ADL's independently Patient will continue to perform IADL's independently Patient will call provider office for new concerns or questions Patient will work with BSW to address care coordination needs and will continue to work with the clinical team to address health care and disease management related needs.  06/13/22:  Patient up to date on appointments                          RNCM - Glycemic Management & Monitored       Timeframe:  Long-Range Goal Priority:  High Start Date:     05/09/2021                         Expected End Date:  ongoing  Patient Goals: Patient will self administer medications as prescribed Patient will attend all scheduled provider appointments Patient will call pharmacy for medication refills Patient will continue to perform ADL's independently Patient will continue to perform IADL's independently Patient will call provider office for new concerns or questions Patient will work with BSW to address care coordination needs and will continue to work with the clinical team to address health care and disease management related needs.      Patient monitor blood sugars twice a day at varies times using Accu-check guide and record results. A1C level will remain at 6.0 or below.    06/13/22:  BG-100-140, A1C-6.6 in August       RNCM - Hypertension Monitored and Managed       Timeframe:  Long-Range Goal Priority:  High Start Date:  05/09/2021                           Expected End Date:  ongoing  Patient Goals Patient will self administer medications as prescribed Patient will attend all scheduled provider appointments Patient will call pharmacy for medication refills Patient will continue to perform ADL's independently Patient will continue to perform IADL's independently Patient will call provider office for new concerns or questions Patient will work with BSW to address care coordination needs and will continue to work with the clinical team to address health care and disease management related needs.    Patient will check blood pressure with new blood pressure monitor at least once a week and record readings.          06/13/22:  seen and evaluated by PCP 05/29/22, checks BP occasionally   Patient verbalizes understanding of instructions and care plan provided today and agrees to view in [place]. Active MyChart status and patient understanding of how to access instructions and care plan via MyChart confirmed with patient.     The Managed Medicaid care management team will  reach out to the patient again over the next 30 business  days.  The  Patient has been provided with contact information for the Managed Medicaid care management team and has been advised to call with any health related questions or concerns.   Aida Raider RN, BSN [place] Management Coordinator - Managed Medicaid High Risk [phone]   Following is a copy of your plan of care:  Care Plan : RNCM Plan of Care  Updates made by [name], RN since 06/13/2022 12:00 AM     Problem: Chronic Disease Management and Care Coordination Needs for DM, HTN, CML and Back Pain  Long-Range Goal: Plan of Care for Chronic Disease management and Care Coordination Needs (HTN, DM, CML, Chronic Back Pain)   Start Date: 05/09/2021  Expected End Date: 09/13/2022  Priority: High  Note:   Current Barriers:  Knowledge Deficits related to plan of care for management of HTN, DMII, and CML and Chronic Back Pain  Chronic Disease Management support and education needs related to HTN, DMII, and CML and Chronic Back Pain 06/13/22:  patient working with pain management for medication to control pain-rates pain as a 7 most days-has appt this week.  No other complaints/issues today  RNCM Clinical Goal(s):  Patient will verbalize understanding of plan for management of HTN, DMII, and CML and Chronic Back Pain verbalize basic understanding of HTN, DMII, and CML and Chronic Back Pain disease process and self health management plan  take all medications exactly as prescribed and will call provider for medication related questions attend all scheduled medical appointments demonstrate ongoing adherence to prescribed treatment plan for HTN, DMII, and CML and Chronic Back Pain as evidenced by daily monitoring and recording of CBG, adherence to ADA/ carb modified diet, adherence to prescribed medication regimen, contacting provider for new or worsened symptoms or questions  demonstrate  ongoing health management independence  continue to work with RN Care Manager to address care management and care coordination needs related to HTN, DMII, and CML and Chronic Back Pain  work with pharmacist to address complex medication regimen  related to HTN, DMII, and CML and Chronic Back Pain work with Education officer, museum to address financial constraints related to overdue payment of back taxes and potential risk of losing home, Limited social support and counseling for anxiety/stress related to the management of HTN, DMII, and CML and Chronic Back Pain. collaborate with the care management team towards completion of advanced directives  through collaboration with RN Care manager, provider, and care team.   Interventions: Inter-disciplinary care team collaboration (see longitudinal plan of care) Evaluation of current treatment plan related to  self management and patient's adherence to plan as established by provider Collaboration with PCP office for Tramadol refill at patient request.  Diabetes:  (Status: Goal on Track (progressing): YES.) Lab Results  Component Value Date   HGBA1C 6.1 (A) 01/16/2022                                                                            6.6                                    04/04/22 Assessed patient's understanding of A1c goal: <6.5% Continue to provided education to patient about basic DM disease process; Reviewed medications with patient and discussed importance of medication adherence. Counseled on importance of regular laboratory monitoring as prescribed;        Discussed plans with patient for ongoing care management follow up and provided patient with direct contact information for care management team;      Reviewed scheduled/upcoming provider appointments including: See RNCM Clinical Goals above.       Call provider for findings outside established parameters.      Referral made to pharmacy team  previously for assistance with complex medication  regimen.  Patient doing well with medication compliance and his understanding of all medications.  Pharmacist referral not needed at this time.       Review of patient status, including review of consultants reports, relevant laboratory and other test results, and medications completed.  Patient understands the importance of drinking low sugar drinks and use of sugar substitutes.       Chronic Myeloid Leukemia (CML)  (Status: Goal on Track (progressing): YES.) Evaluation of current treatment plan related to  CML and patient's adherence to plan as established by provider.  Patient continues to be in remission and the plan is for patient to continue on Gleevec 400 mg daily.  Patient continues to take the [place] with some protein to avoid having diarrhea.  Discussed plans with patient for ongoing care management follow up and provided patient with direct contact information for care management team Reviewed medications with patient and discussed the importance of taking all medications as prescribed; Pharmacy referral for complex medication regimen made previously.  [facility] Pharmacist not needed at this time.  Hypertension: (Status: Goal on Track (progressing): YES.) Last practice recorded Blood Pressure readings: BP Readings from Last 3 Encounters:  01/16/22 115/80  12/26/21 112/76  09/22/21 (!) 144/90   06/13/22:  BP 145/87 Most recent eGFR/CrCl: No results found for: EGFR  No components found for: CRCL  Evaluation of current treatment plan related to hypertension self management and patient's adherence to plan as established by provider;   Reviewed medications with patient and discussed importance of compliance.  Discussed plans with patient for ongoing care management follow up and provided patient with direct contact information for care management team; Discussed patient's recent blood pressure readings at recent medical office visits.  Reviewed scheduled/upcoming provider appointments  including: See RNCM Clinical Goals above. Patient did obtain a blood pressure monitor through [employer].   Continued to re-enforce education on Orthostatic blood pressure changes that occur normally when changing positions from lying to sitting to standing.  Patient continues to report he does experience some lightheaded with moving from a sitting to a standing position which only last a few seconds.  Patient checks his blood pressure and heart rate at time of episode. Patient suspects caffeine may be making heart rate elevated. Instructed patient to change positions slowly and continue to monitor. Patient verbalized understanding.  Pain:  (Status: Goal on Track (progressing): YES.) Pain assessment performed.  Patient reports chronic neck & back pain. Pain rating is on a Pain Scale of 0 - 10. Currently using Ibuprofen 600 mg PRN during the day and Tramadol at night with fair relief - able to sleep only 2 hours at a stretch.  Pain interferes with sleep due to staying in same position for 2 hours.  Per patient, his pain is either triggered by staying in same position for a prolonged period of time or by moving too much. Medications reviewed.  Reviewed provider established plan for pain management; Discussed importance of adherence to all scheduled medical appointments; Patient continues to report having Neuropathy on his left side of his body (arm and leg) causing numbness and lack of coordination with movement.    Will continue to monitor.     Patient Goals/Self-Care Activities: Patient will self administer medications as prescribed as evidenced by self report/primary caregiver report  Patient will attend all scheduled provider appointments as evidenced by clinician review of documented attendance to scheduled appointments and patient/caregiver report Patient will call pharmacy  for medication refills as evidenced by patient report and review of pharmacy fill history as appropriate Patient will  continue to perform ADL's independently as evidenced by patient/caregiver report Patient will continue to perform IADL's independently as evidenced by patient/caregiver report Patient will call provider office for new concerns or questions as evidenced by review of documented incoming telephone call notes and patient report Patient will work with BSW to address care coordination needs and will continue to work with the clinical team to address health care and disease management related needs as evidenced by documented adherence to scheduled care management/care coordination appointments - check blood sugar at prescribed times: twice daily - enter blood sugar readings and medication or insulin into daily log - take the blood sugar log to all doctor visits - check blood pressure weekly - write blood pressure results in a log or diary - keep a blood pressure log - take blood pressure log to all doctor appointments - call doctor for signs and symptoms of high blood pressure - keep all doctor appointments - take medications for blood pressure exactly as prescribed

## 2022-06-13 NOTE — Patient Outreach (Signed)
Medicaid Managed Care   Nurse Care Manager Note  06/13/2022 Name:  Gerald Olson MRN:  [id number] DOB:  1965-11-23  Gerald Olson is an 57 y.o. year old male who is a primary patient of Thedore Mins, Ria Comment, [place].  The [facility] Managed Care Coordination team was consulted for assistance with:    Chronic healthcare management needs, DM, HTN, cirrhosis, neuropathy, anxiety/depression/ADHD, LDD, leukemia in remission  Mr. [name] was given information about Medicaid Managed Care Coordination team services today. Gerald Olson Patient agreed to services and verbal consent obtained.  Engaged with patient by telephone for follow up visit in response to provider referral for case management and/or care coordination services.   Assessments/Interventions:  Review of past medical history, allergies, medications, health status, including review of consultants reports, laboratory and other test data, was performed as part of comprehensive evaluation and provision of chronic care management services.  SDOH (Social Determinants of Health) assessments and interventions performed: SDOH Interventions    Flowsheet Row Patient Outreach Telephone from 06/13/2022 in [place] Patient Outreach Telephone from 03/28/2022 in [place] Office Visit from 09/20/2021 in [place] Patient Outreach Telephone from 07/03/2021 in [place] Patient Outreach Telephone from 06/16/2021 in [place] Patient Outreach Telephone from 05/26/2021 in [place] Interventions        Food Insecurity Interventions Intervention Not Indicated -- -- -- -- --  Transportation Interventions -- Intervention Not Indicated -- -- -- --  Utilities Interventions Intervention Not Indicated -- -- -- -- --  Depression  Interventions/Treatment  -- -- Counseling -- -- Counseling  [Patient working towards finding therapist,  saw psychiatrist and is not being covered so planning to no longer see them]  Stress Interventions -- -- -- Provide Counseling, Offered [name] Provide Counseling, [name]  No Known Allergies  Medications Reviewed Today     Reviewed by [name], RN (Registered Nurse) on 06/13/22 at 44  Med List Status: <None>   Medication Order Taking? Sig Documenting Provider Last Dose Status Informant  Blood Pressure Monitoring (BLOOD PRESSURE MONITOR AUTOMAT) DEVI [id number] No Check blood pressure in AM and PM daily Drubel, Ria Comment, PA-C Taking Active   dapagliflozin propanediol (FARXIGA) 5 MG TABS tablet [id number] No Take 1 tablet (5 mg total) by mouth daily before breakfast.  Patient not taking: Reported on 05/18/2022   [name], MD Not Taking Active   Patient not taking:  Discontinued 08/09/20 1309 (Patient Preference)   glucose blood (ACCU-CHEK GUIDE) test strip [id number] No USE AS DIRECTED 2 TIMES DAILY [name], MD Taking Active   ibuprofen (IBU) 800 MG tablet [id number] No Take 1 tablet every 8 hours as needed for pain  Taking Active   imatinib (GLEEVEC) 400 MG tablet [id number] No Take 1 tablet (400 mg total) by mouth daily. [name], RPH-CPP Taking Active   insulin glargine (LANTUS SOLOSTAR) 100 UNIT/ML Solostar Pen [id number] No Inject 7 Units into the skin every morning. [name], MD Taking Active   Insulin Pen Needle (UNIFINE PENTIPS) 32G X 4 MM MISC [id number] No Use to inject Victoza once a day [name], MD Taking Active   lisinopril (ZESTRIL) 10 MG tablet [id number] No Take 1 tablet by mouth daily.  Taking Active   traMADol (ULTRAM) 50 MG tablet [id number] No Take  1 tablet (50 mg total) by mouth 3 (three) times daily as needed for severe pain.  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 08/09/2020   Mesenteric lymphadenopathy 06/14/2020   Splenomegaly- mild  06/14/2020   History of cannabis abuse 06/14/2020   Recurrent major depressive disorder, in partial remission ([place]) 06/09/2020   Anxiety 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 06/09/2020   Alcohol use disorder, moderate, dependence ([place]) 06/09/2020   Frequent falls 05/23/2020   Foraminal stenosis of cervical region 05/09/2020   Diabetic polyneuropathy    ADHD (attention deficit hyperactivity disorder)    Generalized anxiety disorder    Major depressive disorder    Cervical spondylosis 09/22/2019   Essential hypertension, benign 32/99/2426   Alcoholic cirrhosis of liver 06/02/2018   Type 2 diabetes mellitus with hyperglycemia 04/25/2018   Conditions to be addressed/monitored per PCP order:  Chronic healthcare management needs, DM, HTN, cirrhosis, neuropathy, anxiety/depression/ADHD, LDD, leukemia in remission, spondylosis  Care Plan : RNCM Plan of Care  Updates made by [name], RN since 06/13/2022 12:00 AM     Problem: Chronic Disease Management and Care Coordination Needs for DM, HTN, CML and Back Pain      Long-Range Goal: Plan of Care for Chronic Disease management and Care Coordination Needs (HTN, DM, CML, Chronic Back Pain)   Start Date: 05/09/2021  Expected End Date: 09/13/2022  Priority:  High  Note:   Current Barriers:  Knowledge Deficits related to plan of care for management of HTN, DMII, and CML and Chronic Back Pain  Chronic Disease Management support and education needs related to HTN, DMII, and CML and Chronic Back Pain 06/13/22:  patient working with pain management for medication to control pain-rates pain as a 7 most days-has appt this week.  No other complaints/issues today  RNCM Clinical Goal(s):  Patient will verbalize understanding of plan for management of HTN, DMII, and CML and Chronic Back Pain verbalize basic understanding of HTN, DMII, and CML and Chronic Back Pain disease process and self health management plan  take all medications exactly as prescribed and will call provider for medication related questions attend all scheduled medical appointments demonstrate ongoing adherence to prescribed treatment plan for HTN, DMII, and CML and Chronic Back Pain as evidenced by daily monitoring and recording of CBG, adherence to ADA/ carb modified diet, adherence to prescribed medication regimen, contacting provider for new or worsened symptoms or questions  demonstrate ongoing health management independence  continue to work with [place] to address care management and care coordination needs related to HTN, DMII, and CML and Chronic Back Pain  work with pharmacist to address complex medication regimen  related to HTN, DMII, and CML and Chronic Back Pain work with Education officer, museum to address financial constraints related to overdue payment  of back taxes and potential risk of losing home, Limited social support and counseling for anxiety/stress related to the management of HTN, DMII, and CML and Chronic Back Pain. collaborate with the care management team towards completion of advanced directives  through collaboration with RN Care manager, provider, and care team.   Interventions: Inter-disciplinary care team collaboration (see longitudinal plan of care) Evaluation  of current treatment plan related to  self management and patient's adherence to plan as established by provider Collaboration with PCP office for Tramadol refill at patient request.  Diabetes:  (Status: Goal on Track (progressing): YES.) Lab Results  Component Value Date   HGBA1C 6.1 (A) 01/16/2022                                                                            6.6                                    04/04/22 Assessed patient's understanding of A1c goal: <6.5% Continue to provided education to patient about basic DM disease process; Reviewed medications with patient and discussed importance of medication adherence. Counseled on importance of regular laboratory monitoring as prescribed;        Discussed plans with patient for ongoing care management follow up and provided patient with direct contact information for care management team;      Reviewed scheduled/upcoming provider appointments including: See RNCM Clinical Goals above.       Call provider for findings outside established parameters.      Referral made to pharmacy team previously for assistance with complex medication regimen.  Patient doing well with medication compliance and his understanding of all medications.  Pharmacist referral not needed at this time.       Review of patient status, including review of consultants reports, relevant laboratory and other test results, and medications completed.  Patient understands the importance of drinking low sugar drinks and use of sugar substitutes.       Chronic Myeloid Leukemia (CML)  (Status: Goal on Track (progressing): YES.) Evaluation of current treatment plan related to  CML and patient's adherence to plan as established by provider.  Patient continues to be in remission and the plan is for patient to continue on Gleevec 400 mg daily.  Patient continues to take the Gleevec with some protein to avoid having diarrhea.  Discussed plans with patient for ongoing care management  follow up and provided patient with direct contact information for care management team Reviewed medications with patient and discussed the importance of taking all medications as prescribed; Pharmacy referral for complex medication regimen made previously.  [facility] Pharmacist not needed at this time.  Hypertension: (Status: Goal on Track (progressing): YES.) Last practice recorded Blood Pressure readings: BP Readings from Last 3 Encounters:  01/16/22 115/80  12/26/21 112/76  09/22/21 (!) 144/90   06/13/22:  BP 145/87 Most recent eGFR/CrCl: No results found for: EGFR  No components found for: CRCL  Evaluation of current treatment plan related to hypertension self management and patient's adherence to plan as established by provider;   Reviewed medications with patient and discussed importance of compliance.  Discussed plans with patient for ongoing care management follow up and provided patient with direct contact information for care management team; Discussed patient's recent blood pressure readings at recent medical office visits.  Reviewed scheduled/upcoming provider appointments including: See RNCM Clinical Goals above. Patient did obtain a blood pressure monitor through [employer].   Continued to re-enforce education on Orthostatic blood pressure changes that occur normally when changing positions from lying to sitting to standing.  Patient continues to report he does experience some lightheaded with moving from a sitting to a standing position which only last a few seconds.  Patient checks his blood pressure and heart rate at time of episode. Patient suspects caffeine may be making heart rate elevated. Instructed patient to change positions slowly and continue to monitor. Patient verbalized understanding.  Pain:  (Status: Goal on Track (progressing): YES.) Pain assessment performed.  Patient reports chronic neck & back pain. Pain rating is on a Pain Scale of 0 - 10. Currently using  Ibuprofen 600 mg PRN during the day and Tramadol at night with fair relief - able to sleep only 2 hours at a stretch.  Pain interferes with sleep due to staying in same position for 2 hours.  Per patient, his pain is either triggered by staying in same position for a prolonged period of time or by moving too much. Medications reviewed.  Reviewed provider established plan for pain management; Discussed importance of adherence to all scheduled medical appointments; Patient continues to report having Neuropathy on his left side of his body (arm and leg) causing numbness and lack of coordination with movement.    Will continue to monitor.     Patient Goals/Self-Care Activities: Patient will self administer medications as prescribed as evidenced by self report/primary caregiver report  Patient will attend all scheduled provider appointments as evidenced by clinician review of documented attendance to scheduled appointments and patient/caregiver report Patient will call pharmacy for medication refills as evidenced by patient report and review of pharmacy fill history as appropriate Patient will continue to perform ADL's independently as evidenced by patient/caregiver report Patient will continue to perform IADL's independently as evidenced by patient/caregiver report Patient will call provider office for new concerns or questions as evidenced by review of documented incoming telephone call notes and patient report Patient will work with BSW to address care coordination needs and will continue to work with the clinical team to address health care and disease management related needs as evidenced by documented adherence to scheduled care management/care coordination appointments - check blood sugar at prescribed times: twice daily - enter blood sugar readings and medication or insulin into daily log - take the blood sugar log to all doctor visits - check blood pressure weekly - write blood pressure results  in a log or diary - keep a blood pressure log - take blood pressure log to all doctor appointments - call doctor for signs and symptoms of high blood pressure - keep all doctor appointments - take medications for blood pressure exactly as prescribed   Follow Up:  Patient agrees to Care Plan and Follow-up.  Plan: The Managed Medicaid care management team will reach out to the patient again over the next 30 business  days. and The  Patient has been provided with contact information for the Managed Medicaid care management team and has been advised to call with any health related questions or concerns.  Date/time of next scheduled RN care management/care coordination outreach: 07/13/22 at 315

## 2022-06-15 ENCOUNTER — Other Ambulatory Visit (HOSPITAL_COMMUNITY): Payer: Self-pay

## 2022-06-15 ENCOUNTER — Encounter: Payer: Medicaid Other | Attending: Physical Medicine & Rehabilitation | Admitting: Physical Medicine & Rehabilitation

## 2022-06-15 ENCOUNTER — Encounter: Payer: Self-pay | Admitting: Physical Medicine & Rehabilitation

## 2022-06-15 VITALS — BP 128/80 | HR 94 | Ht 75.0 in | Wt 241.2 lb

## 2022-06-15 DIAGNOSIS — F1210 Cannabis abuse, uncomplicated: Secondary | ICD-10-CM | POA: Insufficient documentation

## 2022-06-15 DIAGNOSIS — Z789 Other specified health status: Secondary | ICD-10-CM | POA: Diagnosis present

## 2022-06-15 DIAGNOSIS — M47816 Spondylosis without myelopathy or radiculopathy, lumbar region: Secondary | ICD-10-CM | POA: Diagnosis present

## 2022-06-15 DIAGNOSIS — E1142 Type 2 diabetes mellitus with diabetic polyneuropathy: Secondary | ICD-10-CM | POA: Diagnosis present

## 2022-06-15 DIAGNOSIS — M47812 Spondylosis without myelopathy or radiculopathy, cervical region: Secondary | ICD-10-CM | POA: Insufficient documentation

## 2022-06-15 MED ORDER — DICLOFENAC SODIUM 50 MG PO TBEC
50.0000 mg | DELAYED_RELEASE_TABLET | Freq: Two times a day (BID) | ORAL | 2 refills | Status: DC
  Filled 2022-06-15: qty 60, 30d supply, fill #0

## 2022-06-15 MED ORDER — PREGABALIN 75 MG PO CAPS
75.0000 mg | ORAL_CAPSULE | Freq: Two times a day (BID) | ORAL | 2 refills | Status: DC
  Filled 2022-06-15: qty 60, 30d supply, fill #0

## 2022-06-15 MED ORDER — CAPSAICIN-CLEANSING GEL 8 % EX KIT
4.0000 | PACK | Freq: Once | CUTANEOUS | Status: AC
  Administered 2022-06-15: 4 via TOPICAL

## 2022-06-15 MED ORDER — CAPSAICIN-CLEANSING GEL 8 % EX KIT: 4.0000 | PACK | Freq: Once | CUTANEOUS | Status: DC

## 2022-06-15 NOTE — Progress Notes (Signed)
Subjective:    Patient ID: Gerald Olson, male    DOB: [birth year], 56 y.o.   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  Patient reports he had back and neck pain for many years with shooting pain down both legs and altered sensation in his feet.  He also developed diabetic polyneuropathy when he feels like this is his greatest area of pain at this time.  He has chronic burning sensations in his feet that is very severe and has not responded well to oral medications.  Reports his lower back pain and sciatica improved after having his lumbar surgery completed however he continues to have neck pain and he then developed worsening pain in his thoracic spine.  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  His back feels stiff throughout the day and impairs his activities. He reports he has failed multiple medications for his  neuropathy including Topamax, gabapentin, amitriptyline, Cymbalta.  He is not sure if he has used Lyrica in the past.  He uses a natural treatments are better for him and was interested in [facility].  He has used tramadol with benefit to his lower back pain, taking it mostly at night because he does not want to have any impairment during the day.  He does take ibuprofen for his back pain with some benefit.  Chiropractic care provided mild benefit to his back pain in the past.  He reports physical therapy did not help and worsened his back pain.  Patient does take occasional cannabis extract for his pain.  He also drinks alcohol 2-3 drinks regularly.  Interval History [name] is here for follow-up of his chronic pain and completion of Qutenza treatment.  He says he researched Qutenza and has no questions about the procedure.  He continues to have neck and back  pain worse around his thoracic spine felt to be related to DISH.  He has been taking ibuprofen for this pain.  He also uses wild lettuce as a pain treatment.  He has not used tramadol in several months and reports it was not providing much benefit.  He continues to use occasional alcohol and THC products.  Patient reports he has used Voltaren tablets in the past for his back pain and they worked better than ibuprofen.  He reports that he tried Lyrica about 10 years ago for his back pain and it did not provide significant improvement.  He has not tried this for his pain in his feet and is interested in trying this.   Pain Inventory Average Pain 7 Pain Right Now 8 My pain is constant, sharp, burning, dull, stabbing, tingling, aching, and numbness  In the last 24 hours, has pain interfered with the following? General activity 9 Relation with others 9 Enjoyment of life 10 What TIME of day is your pain at its worst? morning , daytime, evening, and night Sleep (in general) Poor  Pain is worse with: walking, bending, sitting, and standing Pain improves with: heat/ice and TENS Relief from Meds: 5  walk without assistance walk with assistance use a cane how many minutes can you walk? 5-30 ability to climb steps?  yes do you drive?  yes  disabled: date disabled 2019 I need assistance with the following:  household duties  weakness  numbness tingling trouble walking spasms depression anxiety  Any changes since last visit?  no  Any changes since last visit?  no    Family History  Problem Relation Age of Onset   Aneurysm Father    Drug abuse Sister    Multiple sclerosis Sister    Suicidality Maternal Aunt    Emphysema Maternal Grandfather    Diabetes Neg Hx    Social History   Socioeconomic History   Marital status: Single    Spouse name: Not on file   Number of children: Not on file   Years of education: 63   Highest education level: Master's degree (e.g., MA, MS, MEng,  MEd, MSW, MBA)  Occupational History   Not on file  Tobacco Use   Smoking status: Former    Types: Cigarettes    Quit date: 06/25/2003    Years since quitting: 18.9   Smokeless tobacco: Never   Tobacco comments:    Currently vapes  Vaping Use   Vaping Use: Every day  Substance and Sexual Activity   Alcohol use: Yes    Alcohol/week: 14.0 - 21.0 standard drinks of alcohol    Types: 14 - 21 Cans of beer per week    Comment: 2-3 beers per night on average; sometimes up to 6   Drug use: Yes    Types: Marijuana    Comment: 1/week to help sleep; edibles, does not smoke   Sexual activity: Yes  Other Topics Concern   Not on file  Social History Narrative   Not on file   Social Determinants of Health   Financial Resource Strain: High Risk (05/09/2021)   Overall Financial Resource Strain (CARDIA)    Difficulty of Paying Living Expenses: Hard  Food Insecurity: No Food Insecurity (06/13/2022)   Hunger Vital Sign    Worried About Running Out of Food in the Last Year: Never true    Ran Out of Food in the Last Year: Never true  Transportation Needs: No Transportation Needs (03/28/2022)   PRAPARE - Hydrologist (Medical): No    Lack of Transportation (Non-Medical): No  Physical Activity: Insufficiently Active (05/09/2021)   Exercise Vital Sign    Days of Exercise per Week: 3 days    Minutes of Exercise per Session: 10 min  Stress: Stress Concern Present (07/03/2021)   [place]    Feeling of Stress : Very much  Social Connections: Socially Isolated (05/09/2021)   Social Connection and Isolation Panel [NHANES]    Frequency of Communication with Friends and Family: More than three times a week    Frequency of Social Gatherings with Friends and Family: More than three times a week    Attends Religious Services: Never    Marine scientist or Organizations: No    Attends Archivist  Meetings: Never    Marital Status: Never married   Past Surgical History:  Procedure Laterality Date   CHOLECYSTECTOMY N/A 06/02/2018   Procedure: LAPAROSCOPIC CHOLECYSTECTOMY;  Surgeon: Vickie Epley, MD;  Location: [facility] ORS;  Service: General;  Laterality: N/A;   KNEE ARTHROSCOPY     30 Years ago   lumbar five-sacral one posterior lumbar interbody fusion  01/24/2021   Past Medical History:  Diagnosis Date   ADHD (attention deficit hyperactivity disorder)    Diagnosed during teenage years   Alcohol use disorder    On average 14-21 drinks per week   Alcoholic  cirrhosis of liver    Per patient - condition is not related to moderate alcohol use but rather prolonged NSAID use and recent abuse.   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[place]   Major depressive disorder    Other spondylosis with radiculopathy, cervical region    Retinopathy    Right eye; per patient report   Type 2 diabetes mellitus with hyperglycemia    BP 128/80   Pulse 94   Ht '6\' 3"'$  (1.905 m)   Wt 109.4 kg   SpO2 96%   BMI 30.15 kg/m   Opioid Risk Score:   Fall Risk Score:  `1  Depression screen PHQ 2/9     06/15/2022    1:32 PM 05/18/2022    2:43 PM 10/17/2021    1:17 PM 09/20/2021    3:27 PM 06/20/2021    1:28 PM 05/26/2021    3:22 PM 05/24/2021   10:04 AM  Depression screen PHQ 2/9  Decreased Interest '3 3  3 3 3 2  '$ Down, Depressed, Hopeless '3 3  3 2 2 1  '$ PHQ - 2 Score '6 6  6 5 5 3  '$ Altered sleeping  '3  3 3 3 1  '$ Tired, decreased energy  '3  3 3 3 '$ 0  Change in appetite  '3  3 2 1 '$ 0  Feeling bad or failure about yourself   '3  3 1 3 1   '$ Trouble concentrating  '2  3 1 2 '$ 0  Moving slowly or fidgety/restless  1  0 0 0   Suicidal thoughts  0  1 0 0 0  PHQ-9 Score  '21  22 15 17 5  '$ Difficult doing work/chores  Somewhat difficult  Not difficult at all Somewhat difficult Very difficult Somewhat difficult     Information is confidential and restricted. Go to Review Flowsheets to unlock data.      Review of Systems  Constitutional:  Positive for unexpected weight change.       Gain  HENT: Negative.    Eyes: Negative.   Respiratory: Negative.    Cardiovascular:  Positive for leg swelling.  Gastrointestinal:  Positive for diarrhea.  Endocrine:       High blood sugar  Genitourinary: Negative.   Musculoskeletal:  Positive for back pain and gait problem.  Skin: Negative.   Allergic/Immunologic: Negative.   Neurological:  Positive for weakness and numbness.       Tingling  Hematological: Negative.   Psychiatric/Behavioral:  Positive for dysphoric mood. The patient is nervous/anxious.   All other systems reviewed and are negative.      Objective:   Physical Exam  Gen: no distress, normal appearing HEENT: oral mucosa pink and moist, NCAT Cardio: Reg rate Chest: normal effort, normal rate of breathing Abd: soft, non-distended Ext: no edema Psych: pleasant, normal affect Skin: intact Neuro: Alert and oriented x4, follows commands, normal speech language, cranial nerves II through XII grossly intact  Sensation decreased to light touch in a stocking glove distribution in bilateral lower extremities below the knee  musculoskeletal:   Decreased motion at the lumbar and cervical spine in all directions Tenderness throughout paraspinals but greater in the cervical and thoracic spine compared to lumbar spine No wounds or skin breakdown noted in bilateral feet  L spine 01/24/2021 Intraoperative images document instrumented posterolateral and interbody PLIF L5-S1. The L5 pedicle screws are directed towards the superior  endplate. Graft markers project in the interspace. Alignment preserved..   IMPRESSION: Limited intraoperative lateral view of the lumbar spine obtained for localization purposes    L spine MRI 11/22/2020 COMPARISON:  MRI lumbar spine July 20, 2018.   FINDINGS: Segmentation:  Standard.   Alignment:  Grade 1 anterolisthesis of L5 on S1, similar to prior.   Vertebrae: Vertebral body heights are maintained. No specific evidence of acute fracture, discitis/osteomyelitis, or suspicious bone lesion. Degenerative/discogenic endplate signal changes about the right eccentric L5-S1 disc.   Conus medullaris and cauda equina: Conus extends to the T12-L1 level. Conus appears normal.   Paraspinal and other soft tissues: Unremarkable.   Disc levels:   T12-L1: Only imaged sagittally. Disc bulging and facet hypertrophy without evidence of significant canal or foraminal stenosis.   L1-L2: Small broad disc bulge with small superimposed left subarticular disc protrusion. Mild facet hypertrophy. No significant canal or foraminal stenosis.   L2-L3: No significant disc protrusion, foraminal stenosis, or canal stenosis.   L3-L4: Similar left foraminal/far lateral disc protrusion which contacts the exiting/exited left L3 nerve root without displacement (series 8, image 20). No significant canal or foraminal stenosis.   L4-L5: Broad disc bulge with superimposed left foraminal disc protrusion with annular fissure. Mild bilateral subarticular recess stenosis. No significant central canal stenosis. Mild left foraminal stenosis, similar to prior. No significant right foraminal stenosis.   L5-S1: Grade 1 anterolisthesis of L5 on S1. Uncovering of the disc with superimposed disc bulge and bilateral foraminal disc protrusions. Severe bilateral facet arthropathy/hypertrophy. Similar right greater than left subarticular recess stenosis with possible impingement of the descending right S1 nerve  root. Similar moderate bilateral foraminal stenosis.   IMPRESSION: 1. Similar grade 1 anterolisthesis of L5 on S1 with right greater than left subarticular recess stenosis, possible impingement of the descending right S1 nerve root, and moderate right greater than left foraminal stenosis. 2. Similar disc bulge with annular fissure on the left at L4-L5 with mild left foraminal stenosis and mild bilateral subarticular recess stenosis. 3. Similar left foraminal/far lateral disc protrusion at L3-L4 which contacts the exiting/exited left L3 nerve root, unchanged.     C spine MRI 05/29/2020 FINDINGS: Alignment: Straightening of the normal cervical lordosis. One or 2 mm of degenerative anterolisthesis at C7-T1.   Vertebrae: No fracture or primary bone lesion.   Cord: No primary cord lesion.  See below regarding stenosis.   Posterior Fossa, vertebral arteries, paraspinal tissues: Negative   Disc levels:   Foramen magnum is widely patent. No significant finding at the C1-2 level.   C2-3: Endplate osteophytes and bulging of the disc. No compressive central canal stenosis. Mild foraminal narrowing on the right.   C3-4: Endplate osteophytes and bulging of the disc. AP diameter of the canal in the midline 9.3 mm. Bilateral foraminal encroachment right worse than left. Either C4 nerve could be affected.   C4-5: Endplate osteophytes and bulging of the disc. AP diameter of the canal in the midline 7.6 mm. Slight triangulation of the cord without abnormal cord signal. Bilateral bony foraminal stenosis could affect either C5 nerve.   C5-6: Spondylosis with endplate osteophytes and protruding  disc material more prominent towards the left. AP diameter of the canal in the midline 6.8 mm. Effacement of the subarachnoid space and some cord deformity particularly on the left. No conclusive abnormal T2 signal within the cord. Bilateral foraminal stenosis could compress either or both C6  nerves.   C6-7: Endplate osteophytes and bulging of the disc. AP diameter of the canal in the midline 8.2 mm. No cord deformity. Bilateral foraminal stenosis left worse than right. Either C7 nerve could be affected, particularly the left.   C7-T1: Bilateral facet arthropathy with 1 or 2 mm of anterolisthesis. No disc pathology. No compressive canal stenosis. Bilateral foraminal stenosis could affect either C8 nerve. Edematous change of the facet joints could certainly relate to regional pain.   IMPRESSION: C2-3: Mild foraminal narrowing on the right.   C3-4: Spondylosis with bilateral foraminal narrowing right worse than left. Either C4 nerve could be affected.   C4-5: Spondylosis with bilateral foraminal narrowing. Either C5 nerve could be affected. Canal narrowing with AP diameter of 7.6 mm. Slight deformity of the cord without abnormal cord signal.   C5-6: Spondylosis with bilateral foraminal narrowing. Findings more pronounced on the left. Either C6 nerve could be affected. Canal narrowing worse on the left with deformity of the cord. I do not see conclusive T2 signal within the cord. AP diameter of the canal in the midline only 6.8 mm. The patient could be at risk of developing myelopathy at this level.   C6-7: Spondylosis with bilateral foraminal narrowing. Either C7 nerve could be affected. Somewhat worse on the left.   C7-T1: Facet arthropathy with 1 or 2 mm of anterolisthesis and edematous change. Bilateral foraminal stenosis could affect either C8 nerve. The facet arthropathy is likely painful.   Assessment & Plan:  Diabetic polyneuropathy due to diabetes mellitus --Discussed Qutenza as an option for neuropathic pain control. Discussed that this is a capsaicin patch, stronger than capsaicin cream. Discussed that it is currently approved for diabetic peripheral neuropathy and post-herpetic neuralgia, but that it has also shown benefit in treating other forms of  neuropathy. Provided patient with link to site to learn more about the patch: [employer]. Discussed that the patch would be placed in office and benefits usually last 3 months. Discussed that unintended exposure to capsaicin can cause severe irritation of eyes, mucous membranes, respiratory tract, and skin, but that Qutenza is a local treatment and does not have the systemic side effects of other nerve medications. Discussed that there may be pain, itching, erythema, and decreased sensory function associated with the application of Qutenza. Side effects usually subside within 1 week. A cold pack of analgesic medications can help with these side effects. Blood pressure can also be increased due to pain associated with administration of the patch. 4 patchs of Qutenza was applied to the area of pain. Ice packs were applied during the procedure to ensure patient comfort. Blood pressure was monitored every 15 minutes. The patient tolerated the procedure well. Post-procedure instructions were given and follow-up has been scheduled.  -Lyrica 75 mg twice daily ordered -Discussed importance of blood glucose control to prevent worsening of his polyneuropathy  Back and neck pain.  Imaging has shown lumbar and cervical spondylosis and he is status post PLIF L5-S1.  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  -Primary use of tramadol however we will not initiate today due to current use of THC products and alcohol, discussed cessation of this products. -We will order Voltaren 50 mg twice  daily, discussed stopping ibuprofen and any other NSAIDs

## 2022-06-18 ENCOUNTER — Other Ambulatory Visit (HOSPITAL_COMMUNITY): Payer: Self-pay

## 2022-06-18 ENCOUNTER — Other Ambulatory Visit: Payer: Self-pay | Admitting: Oncology

## 2022-06-18 DIAGNOSIS — C9210 Chronic myeloid leukemia, BCR/ABL-positive, not having achieved remission: Secondary | ICD-10-CM

## 2022-06-18 MED ORDER — IMATINIB MESYLATE 400 MG PO TABS
400.0000 mg | ORAL_TABLET | Freq: Every day | ORAL | 5 refills | Status: DC
  Filled 2022-06-18: qty 30, 30d supply, fill #0
  Filled 2022-07-13: qty 30, 30d supply, fill #1
  Filled 2022-08-14: qty 30, 30d supply, fill #2
  Filled 2022-09-12: qty 30, 30d supply, fill #3
  Filled 2022-11-13 (×2): qty 30, 30d supply, fill #4
  Filled 2022-12-27: qty 30, 30d supply, fill #5

## 2022-06-20 ENCOUNTER — Inpatient Hospital Stay: Payer: Medicaid Other | Attending: Oncology

## 2022-06-20 DIAGNOSIS — D649 Anemia, unspecified: Secondary | ICD-10-CM | POA: Insufficient documentation

## 2022-06-20 DIAGNOSIS — R161 Splenomegaly, not elsewhere classified: Secondary | ICD-10-CM | POA: Insufficient documentation

## 2022-06-20 DIAGNOSIS — C9211 Chronic myeloid leukemia, BCR/ABL-positive, in remission: Secondary | ICD-10-CM | POA: Diagnosis not present

## 2022-06-20 DIAGNOSIS — C9210 Chronic myeloid leukemia, BCR/ABL-positive, not having achieved remission: Secondary | ICD-10-CM

## 2022-06-20 LAB — CBC WITH DIFFERENTIAL/PLATELET
Abs Immature Granulocytes: 0.01 10*3/uL (ref 0.00–0.07)
Basophils Absolute: 0 10*3/uL (ref 0.0–0.1)
Basophils Relative: 1 %
Eosinophils Absolute: 0.3 10*3/uL (ref 0.0–0.5)
Eosinophils Relative: 6 %
HCT: 38.4 % — ABNORMAL LOW (ref 39.0–52.0)
Hemoglobin: 12.3 g/dL — ABNORMAL LOW (ref 13.0–17.0)
Immature Granulocytes: 0 %
Lymphocytes Relative: 19 %
Lymphs Abs: 1.1 10*3/uL (ref 0.7–4.0)
MCH: 27.8 pg (ref 26.0–34.0)
MCHC: 32 g/dL (ref 30.0–36.0)
MCV: 86.9 fL (ref 80.0–100.0)
Monocytes Absolute: 0.7 10*3/uL (ref 0.1–1.0)
Monocytes Relative: 13 %
Neutro Abs: 3.7 10*3/uL (ref 1.7–7.7)
Neutrophils Relative %: 61 %
Platelets: 283 10*3/uL (ref 150–400)
RBC: 4.42 MIL/uL (ref 4.22–5.81)
RDW: 15.6 % — ABNORMAL HIGH (ref 11.5–15.5)
WBC: 5.9 10*3/uL (ref 4.0–10.5)
nRBC: 0 % (ref 0.0–0.2)

## 2022-06-20 LAB — COMPREHENSIVE METABOLIC PANEL
ALT: 37 U/L (ref 0–44)
AST: 52 U/L — ABNORMAL HIGH (ref 15–41)
Albumin: 3.4 g/dL — ABNORMAL LOW (ref 3.5–5.0)
Alkaline Phosphatase: 72 U/L (ref 38–126)
Anion gap: 7 (ref 5–15)
BUN: 19 mg/dL (ref 6–20)
CO2: 24 mmol/L (ref 22–32)
Calcium: 8.1 mg/dL — ABNORMAL LOW (ref 8.9–10.3)
Chloride: 104 mmol/L (ref 98–111)
Creatinine, Ser: 0.97 mg/dL (ref 0.61–1.24)
GFR, Estimated: >60 mL/min (ref >60)
Glucose, Bld: 207 mg/dL — ABNORMAL HIGH (ref 70–99)
Potassium: 4 mmol/L (ref 3.5–5.1)
Sodium: 135 mmol/L (ref 135–145)
Total Bilirubin: 0.6 mg/dL (ref 0.3–1.2)
Total Protein: 6 g/dL — ABNORMAL LOW (ref 6.5–8.1)

## 2022-06-20 LAB — MAGNESIUM: Magnesium: 2.1 mg/dL (ref 1.7–2.4)

## 2022-06-20 LAB — PHOSPHORUS: Phosphorus: 3 mg/dL (ref 2.5–4.6)

## 2022-06-27 LAB — BCR-ABL1, CML/ALL, PCR, QUANT: Interpretation (BCRAL):: NEGATIVE

## 2022-07-04 ENCOUNTER — Inpatient Hospital Stay: Payer: Medicaid Other | Attending: Oncology | Admitting: Oncology

## 2022-07-04 DIAGNOSIS — R161 Splenomegaly, not elsewhere classified: Secondary | ICD-10-CM

## 2022-07-04 DIAGNOSIS — Z87891 Personal history of nicotine dependence: Secondary | ICD-10-CM

## 2022-07-04 DIAGNOSIS — I10 Essential (primary) hypertension: Secondary | ICD-10-CM | POA: Diagnosis not present

## 2022-07-04 DIAGNOSIS — C9210 Chronic myeloid leukemia, BCR/ABL-positive, not having achieved remission: Secondary | ICD-10-CM | POA: Diagnosis not present

## 2022-07-04 DIAGNOSIS — M549 Dorsalgia, unspecified: Secondary | ICD-10-CM | POA: Diagnosis not present

## 2022-07-04 DIAGNOSIS — M542 Cervicalgia: Secondary | ICD-10-CM

## 2022-07-04 DIAGNOSIS — E1121 Type 2 diabetes mellitus with diabetic nephropathy: Secondary | ICD-10-CM | POA: Diagnosis not present

## 2022-07-04 DIAGNOSIS — R197 Diarrhea, unspecified: Secondary | ICD-10-CM | POA: Diagnosis not present

## 2022-07-04 NOTE — Progress Notes (Signed)
Taking his gleevac as prescribed. Appetite is fair but he is gaining weight, 240 lb at MD visit recently. Has Fatigue.

## 2022-07-04 NOTE — Progress Notes (Signed)
[place]  [phone] [phone]  ID: Gerald Olson OB: February 07, 1966  MR#: [id number]  TWS#:[id number]  Patient Care Team: [name], PA-C as PCP - General (Physician Assistant) Ethelda Chick as Social Worker [name], Collin as [place] Management (Licensed Clinical Social Worker) Grayland Ormond, Kathlene November, MD as Consulting Physician (Oncology) Craft, Lorel Monaco, RN as Case Manager  I connected with Gerald Olson on 07/04/22 at 11:00 AM EST by video enabled telemedicine visit and verified that I am speaking with the correct person using two identifiers.   I discussed the limitations, risks, security and privacy concerns of performing an evaluation and management service by telemedicine and the availability of in-person appointments. I also discussed with the patient that there may be a patient responsible charge related to this service. The patient expressed understanding and agreed to proceed.   Other persons participating in the visit and their role in the encounter: Patient, MD.  Patient's location: Home. Provider's location: Clinic.  CHIEF COMPLAINT: CML.  INTERVAL HISTORY: Patient agreed to video assisted telemedicine visit for further evaluation and discussion of his laboratory results.  He has microphone ultimately was not working and visit was transitioned to telephone.  He currently feels well and is asymptomatic.  He is tolerating Gleevec without significant side effects.  He continues to have chronic back and neck pain.  He has no neurologic complaints. He denies any recent fevers or illnesses. He has a good appetite and denies weight loss. He has no chest pain, shortness of breath, cough, or hemoptysis. He denies any nausea, vomiting, constipation, or diarrhea. He has no melena or hematochezia. He has no urinary complaints.  Patient offers no further specific complaints today.  REVIEW OF SYSTEMS:   Review  of Systems  Constitutional: Negative.  Negative for fever, malaise/fatigue and weight loss.  Respiratory: Negative.  Negative for cough, hemoptysis and shortness of breath.   Cardiovascular: Negative.  Negative for chest pain and leg swelling.  Gastrointestinal: Negative.  Negative for abdominal pain, blood in stool and diarrhea.  Genitourinary: Negative.  Negative for dysuria, flank pain and hematuria.  Musculoskeletal:  Positive for back pain and neck pain.  Skin: Negative.  Negative for rash.  Neurological: Negative.  Negative for dizziness, focal weakness, weakness and headaches.  Psychiatric/Behavioral: Negative.  The patient is not nervous/anxious.     As per HPI. Otherwise, a complete review of systems is negative.  PAST MEDICAL HISTORY: Past Medical History:  Diagnosis Date   ADHD (attention deficit hyperactivity disorder)    Diagnosed during teenage years   Alcohol use disorder    On average 14-21 drinks per week   Alcoholic cirrhosis of liver    Per patient - condition is not related to moderate alcohol use but rather prolonged NSAID use and recent abuse.   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[place]   Major depressive disorder    Other spondylosis with radiculopathy, cervical region    Retinopathy    Right eye; per patient report  Type 2 diabetes mellitus with hyperglycemia     PAST SURGICAL HISTORY: Past Surgical History:  Procedure Laterality Date   CHOLECYSTECTOMY N/A 06/02/2018   Procedure: LAPAROSCOPIC CHOLECYSTECTOMY;  Surgeon: Vickie Epley, MD;  Location: [facility] ORS;  Service: General;   Laterality: N/A;   KNEE ARTHROSCOPY     30 Years ago   lumbar five-sacral one posterior lumbar interbody fusion  01/24/2021    FAMILY HISTORY: Family History  Problem Relation Age of Onset   Aneurysm Father    Drug abuse Sister    Multiple sclerosis Sister    Suicidality Maternal Aunt    Emphysema Maternal Grandfather    Diabetes Neg Hx     ADVANCED DIRECTIVES (Y/N):  N  HEALTH MAINTENANCE: Social History   Tobacco Use   Smoking status: Former    Types: Cigarettes    Quit date: 06/25/2003    Years since quitting: 19.0   Smokeless tobacco: Never   Tobacco comments:    Currently vapes  Vaping Use   Vaping Use: Every day  Substance Use Topics   Alcohol use: Yes    Alcohol/week: 14.0 - 21.0 standard drinks of alcohol    Types: 14 - 21 Cans of beer per week    Comment: 2-3 beers per night on average; sometimes up to 6   Drug use: Yes    Types: Marijuana    Comment: 1/week to help sleep; edibles, does not smoke     Colonoscopy:  PAP:  Bone density:  Lipid panel:  No Known Allergies   Current Outpatient Medications  Medication Sig Dispense Refill   glucose blood (ACCU-CHEK GUIDE) test strip USE AS DIRECTED 2 TIMES DAILY 180 strip 0   imatinib (GLEEVEC) 400 MG tablet Take 1 tablet (400 mg total) by mouth daily. 30 tablet 5   insulin glargine (LANTUS SOLOSTAR) 100 UNIT/ML Solostar Pen Inject 7 Units into the skin every morning. 15 mL 3   Insulin Pen Needle (UNIFINE PENTIPS) 32G X 4 MM MISC Use to inject Victoza once a day 100 each 11   lisinopril (ZESTRIL) 10 MG tablet Take 1 tablet by mouth daily. 90 tablet 3   pregabalin (LYRICA) 75 MG capsule Take 1 capsule (75 mg total) by mouth 2 (two) times daily. 60 capsule 2   Blood Pressure Monitoring (BLOOD PRESSURE MONITOR AUTOMAT) DEVI Check blood pressure in AM and PM daily 1 each 0   dapagliflozin propanediol (FARXIGA) 5 MG TABS tablet Take 1 tablet (5 mg total) by mouth daily before breakfast. (Patient not taking:  Reported on 07/04/2022) 90 tablet 3   diclofenac (VOLTAREN) 50 MG EC tablet Take 1 tablet (50 mg total) by mouth 2 (two) times daily. (Patient not taking: Reported on 07/04/2022) 60 tablet 2   No current facility-administered medications for this visit.    OBJECTIVE: There were no vitals filed for this visit.    There is no height or weight on file to calculate BMI.    ECOG FS:0 - Asymptomatic  General: Well-developed, well-nourished, no acute distress. HEENT: Normocephalic. Neuro: Alert, answering all questions appropriately. Cranial nerves grossly intact. Psych: Normal affect.  LAB RESULTS:  Lab Results  Component Value Date   NA 135 06/20/2022   K 4.0 06/20/2022   CL 104 06/20/2022   CO2 24 06/20/2022   GLUCOSE 207 (H) 06/20/2022   BUN 19 06/20/2022   CREATININE 0.97 06/20/2022   CALCIUM 8.1 (L) 06/20/2022   PROT 6.0 (L) 06/20/2022   ALBUMIN 3.4 (L) 06/20/2022  AST 52 (H) 06/20/2022   ALT 37 06/20/2022   ALKPHOS 72 06/20/2022   BILITOT 0.6 06/20/2022   GFRNONAA >60 06/20/2022   GFRAA 90 09/14/2020    Lab Results  Component Value Date   WBC 5.9 06/20/2022   NEUTROABS 3.7 06/20/2022   HGB 12.3 (L) 06/20/2022   HCT 38.4 (L) 06/20/2022   MCV 86.9 06/20/2022   PLT 283 06/20/2022     STUDIES: No results found.   ASSESSMENT: CML  PLAN:    1.  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  Repeat CT scan results from July 18, 2020 reviewed independently with only mild abdominal lymphadenopathy and mild splenomegaly that are chronic and unchanged.  Bone marrow biopsy was negative for disease.  Patient was initiated on 400 mg Gleevec in approximately November 2021 and has been in and continues to be in a complete molecular remission since January 2022.  His most recent BCR-ABL test on June 20, 2022 continue to be negative.  Continue Gleevec 400 mg daily.  Return to clinic in 3 months  for laboratory work only and then in 6 months for laboratory work and video assisted telemedicine visit.    2.  Anemia: Mild.  Patient's most recent hemoglobin is 12.3.   3. Splenomegaly/mesenteric lymphadenopathy: Chronic and unchanged.  Unclear if this is related to CML.  Monitor.   4.  Back/neck pain: Chronic and unchanged.   5.  Diarrhea: Patient does not complain of this today.  I spent a total of 30 minutes reviewing chart data, face-to-face evaluation with the patient, counseling and coordination of care as detailed above.  Patient expressed understanding and was in agreement with this plan. He also understands that He can call clinic at any time with any questions, concerns, or complaints.    Lloyd Huger, MD   07/04/2022 12:27 PM

## 2022-07-10 ENCOUNTER — Other Ambulatory Visit (HOSPITAL_COMMUNITY): Payer: Self-pay

## 2022-07-13 ENCOUNTER — Other Ambulatory Visit (HOSPITAL_COMMUNITY): Payer: Self-pay

## 2022-07-13 ENCOUNTER — Encounter: Payer: Self-pay | Admitting: Obstetrics and Gynecology

## 2022-07-13 ENCOUNTER — Other Ambulatory Visit: Payer: Medicaid Other | Admitting: Obstetrics and Gynecology

## 2022-07-13 NOTE — Patient Outreach (Signed)
Medicaid Managed Care   Nurse Care Manager Note  07/13/2022 Name:  Gerald Olson MRN:  [id number] DOB:  20-Jul-1966  Gerald Olson is an 56 y.o. year old male who is a primary patient of Thedore Mins, Ria Comment, [place].  The [facility] Managed Care Coordination team was consulted for assistance with:    Chronic healthcare management needs, HTN, DM, chronic pain, neuropathy, anxiety/depression/ADHD, LDD, h/o leukemia  Gerald Olson was given information about Medicaid Managed Care Coordination team services today. Gerald Olson Patient agreed to services and verbal consent obtained.  Engaged with patient by telephone for follow up visit in response to provider referral for case management and/or care coordination services.   Assessments/Interventions:  Review of past medical history, allergies, medications, health status, including review of consultants reports, laboratory and other test data, was performed as part of comprehensive evaluation and provision of chronic care management services.  SDOH (Social Determinants of Health) assessments and interventions performed: SDOH Interventions    Flowsheet Row Patient Outreach Telephone from 07/13/2022 in [place] Patient Outreach Telephone from 06/13/2022 in [place] Coordination Patient Outreach Telephone from 03/28/2022 in [place] Office Visit from 09/20/2021 in [place] Patient Outreach Telephone from 07/03/2021 in [place] Patient Outreach Telephone from 06/16/2021 in [place] Interventions        Food Insecurity Interventions -- Intervention Not Indicated -- -- -- --  Transportation Interventions -- -- Intervention Not Indicated -- -- --  Utilities Interventions -- Intervention Not Indicated -- -- -- --  Alcohol Usage Interventions Alohol  Education/Brief Advice  [providers have counseled patient regarding alcohol use] -- -- -- -- --  Depression Interventions/Treatment  -- -- -- Counseling -- --  Stress Interventions -- -- -- -- Provide Counseling, Offered [name] Provide Counseling, [place]  No Known Allergies  Medications Reviewed Today     Reviewed by [name], RN (Registered Nurse) on 07/13/22 at [place] List Status: <None>   Medication Order Taking? Sig Documenting Provider Last Dose Status Informant  Blood Pressure Monitoring (BLOOD PRESSURE MONITOR AUTOMAT) DEVI [id number] No Check blood pressure in AM and PM daily Drubel, Ria Comment, PA-C Taking Active   dapagliflozin propanediol (FARXIGA) 5 MG TABS tablet [id number] No Take 1 tablet (5 mg total) by mouth daily before breakfast.  Patient not taking: Reported on 07/04/2022   [name], MD Not Taking Active   diclofenac (VOLTAREN) 50 MG EC tablet [id number] No Take 1 tablet (50 mg total) by mouth 2 (two) times daily.  Patient not taking: Reported on 07/04/2022   Jennye Boroughs, MD Not Taking Active   Patient not taking:  Discontinued 08/09/20 1309 (Patient Preference)   glucose blood (ACCU-CHEK GUIDE) test strip [id number] No USE AS DIRECTED 2 TIMES DAILY [name], MD Taking Active   imatinib (GLEEVEC) 400 MG tablet [id number] No Take 1 tablet (400 mg total) by mouth daily. Lloyd Huger, MD Taking Active   insulin glargine (LANTUS SOLOSTAR) 100 UNIT/ML Solostar Pen [id number] No Inject 7 Units into the skin every morning. [name], MD Taking Active   Insulin Pen Needle (UNIFINE PENTIPS) 32G X 4 MM MISC [id number] No Use to inject Victoza once a day [name], MD Taking Active   lisinopril (ZESTRIL) 10 MG tablet [id number] No Take 1 tablet by mouth daily.  Taking Active   pregabalin (LYRICA) 75 MG capsule [id number] No Take 1 capsule (75 mg total) by mouth 2 (two) times  daily. 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 08/09/2020   Mesenteric lymphadenopathy 06/14/2020   Splenomegaly- mild  06/14/2020   History of cannabis abuse 06/14/2020   Recurrent major depressive disorder, in partial remission ([address]) 06/09/2020   Anxiety 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 06/09/2020   Alcohol use disorder, moderate, dependence ([place]) 06/09/2020   Frequent falls 05/23/2020   Foraminal stenosis of cervical region 05/09/2020   Diabetic polyneuropathy    ADHD (attention deficit hyperactivity disorder)    Generalized anxiety disorder    Major depressive disorder    Cervical spondylosis 09/22/2019   Essential hypertension, benign 35/32/9924   Alcoholic cirrhosis of liver 06/02/2018   Type 2 diabetes mellitus with hyperglycemia 04/25/2018   Conditions to be addressed/monitored per PCP order:  Chronic healthcare management needs, HTN, DM, chronic pain, neuropathy, anxiety/depression/ADHD, LDD, h/o leukemia  Care Plan : RNCM Plan of Care  Updates made by [name], RN since 07/13/2022 12:00 AM     Problem: Chronic Disease Management and Care Coordination Needs for DM, HTN, CML and Back Pain      Long-Range Goal: Plan of Care for Chronic Disease management and Care Coordination Needs (HTN, DM, CML, Chronic Back Pain)   Start Date: 05/09/2021  Expected End Date: 09/13/2022  Priority: High  Note:   Current Barriers:  Knowledge Deficits related to plan of care for management of HTN, DMII, and CML and  Chronic Back Pain  Chronic Disease Management support and education needs related to HTN, DMII, and CML and Chronic Back Pain 07/13/22:  Patient states neuropathy pain better with addition of Qutenza patch.  Blood sugars and blood pressure WNL.  Has upcoming appts with PCP, ENDO, and pain management  RNCM Clinical Goal(s):  Patient will verbalize understanding of plan for management of HTN, DMII, and CML and Chronic Back Pain verbalize basic understanding of HTN, DMII, and CML and Chronic Back Pain disease process and self health management plan  take all medications exactly as prescribed and will call provider for medication related questions attend all scheduled medical appointments demonstrate ongoing adherence to prescribed treatment plan for HTN, DMII, and CML and Chronic Back Pain as evidenced by daily monitoring and recording of CBG, adherence to ADA/ carb modified diet, adherence to prescribed medication regimen, contacting provider for new or worsened symptoms or questions  demonstrate ongoing health management independence  continue to work with RN Care Manager to address care management and care coordination needs related to HTN, DMII, and CML and Chronic Back Pain  work with pharmacist to address complex medication regimen  related to HTN, DMII, and CML and Chronic Back Pain work with Education officer, museum to address financial constraints related to overdue payment of back taxes and potential risk of losing home, Limited social support and counseling for anxiety/stress related to the management of HTN, DMII, and CML and Chronic Back Pain. collaborate with  the care management team towards completion of advanced directives  through collaboration with RN Care manager, provider, and care team.   Interventions: Inter-disciplinary care team collaboration (see longitudinal plan of care) Evaluation of current treatment plan related to  self management and patient's adherence to plan as established by  provider Collaboration with PCP office for Tramadol refill at patient request.  Diabetes:  (Status: Goal on Track (progressing): YES.) Lab Results  Component Value Date   HGBA1C 6.1 (A) 01/16/2022                                                                            6.6                                    04/04/22 Assessed patient's understanding of A1c goal: <6.5% Continue to provided education to patient about basic DM disease process; Reviewed medications with patient and discussed importance of medication adherence. Counseled on importance of regular laboratory monitoring as prescribed;        Discussed plans with patient for ongoing care management follow up and provided patient with direct contact information for care management team;      Reviewed scheduled/upcoming provider appointments including: See RNCM Clinical Goals above.       Call provider for findings outside established parameters.      Referral made to pharmacy team previously for assistance with complex medication regimen.  Patient doing well with medication compliance and his understanding of all medications.  Pharmacist referral not needed at this time.       Review of patient status, including review of consultants reports, relevant laboratory and other test results, and medications completed.  Patient understands the importance of drinking low sugar drinks and use of sugar substitutes.       Chronic Myeloid Leukemia (CML)  (Status: Goal on Track (progressing): YES.) Evaluation of current treatment plan related to  CML and patient's adherence to plan as established by provider.  Patient continues to be in remission and the plan is for patient to continue on Gleevec 400 mg daily.  Patient continues to take the [place] with some protein to avoid having diarrhea.  Discussed plans with patient for ongoing care management follow up and provided patient with direct contact information for care management team Reviewed  medications with patient and discussed the importance of taking all medications as prescribed; Pharmacy referral for complex medication regimen made previously.  [facility] Pharmacist not needed at this time. 07/13/22: ONC appt 07/04/22-WNL  Hypertension: (Status: Goal on Track (progressing): YES.) Last practice recorded Blood Pressure readings: BP Readings from Last 3 Encounters:  01/16/22 115/80  12/26/21 112/76  09/22/21 (!) 144/90   06/13/22:  BP 145/87 Most recent eGFR/CrCl: No results found for: EGFR  No components found for: CRCL  Evaluation of current treatment plan related to hypertension self management and patient's adherence to plan as established by provider;   Reviewed medications with patient and discussed importance of compliance.  Discussed plans with patient for ongoing care management follow up and provided patient with direct contact information for care management team; Discussed patient's recent blood pressure readings  at recent medical office visits.  Reviewed scheduled/upcoming provider appointments including: See RNCM Clinical Goals above. Patient did obtain a blood pressure monitor through [employer].   Continued to re-enforce education on Orthostatic blood pressure changes that occur normally when changing positions from lying to sitting to standing.  Patient continues to report he does experience some lightheaded with moving from a sitting to a standing position which only last a few seconds.  Patient checks his blood pressure and heart rate at time of episode. Patient suspects caffeine may be making heart rate elevated. Instructed patient to change positions slowly and continue to monitor. Patient verbalized understanding.  Pain:  (Status: Goal on Track (progressing): YES.) Pain assessment performed.  Patient reports chronic neck & back pain. Pain rating is on a Pain Scale of 0 - 10. Currently using Ibuprofen 600 mg PRN during the day and Tramadol at night with fair  relief - able to sleep only 2 hours at a stretch.  Pain interferes with sleep due to staying in same position for 2 hours.  Per patient, his pain is either triggered by staying in same position for a prolonged period of time or by moving too much. Medications reviewed.  Reviewed provider established plan for pain management; Discussed importance of adherence to all scheduled medical appointments; Patient continues to report having Neuropathy on his left side of his body (arm and leg) causing numbness and lack of coordination with movement.    Will continue to monitor.     Patient Goals/Self-Care Activities: Patient will self administer medications as prescribed as evidenced by self report/primary caregiver report  Patient will attend all scheduled provider appointments as evidenced by clinician review of documented attendance to scheduled appointments and patient/caregiver report Patient will call pharmacy for medication refills as evidenced by patient report and review of pharmacy fill history as appropriate Patient will continue to perform ADL's independently as evidenced by patient/caregiver report Patient will continue to perform IADL's independently as evidenced by patient/caregiver report Patient will call provider office for new concerns or questions as evidenced by review of documented incoming telephone call notes and patient report Patient will work with BSW to address care coordination needs and will continue to work with the clinical team to address health care and disease management related needs as evidenced by documented adherence to scheduled care management/care coordination appointments - check blood sugar at prescribed times: twice daily - enter blood sugar readings and medication or insulin into daily log - take the blood sugar log to all doctor visits - check blood pressure weekly - write blood pressure results in a log or diary - keep a blood pressure log - take blood  pressure log to all doctor appointments - call doctor for signs and symptoms of high blood pressure - keep all doctor appointments - take medications for blood pressure exactly as prescribed   Follow Up:  Patient agrees to Care Plan and Follow-up.  Plan: The Managed Medicaid care management team will reach out to the patient again over the next 30 business  days. and The  Patient has been provided with contact information for the Managed Medicaid care management team and has been advised to call with any health related questions or concerns.  Date/time of next scheduled RN care management/care coordination outreach:  08/15/22 at 315.

## 2022-07-13 NOTE — Patient Instructions (Signed)
Hi Gerald Olson, thank you for updating me today-have a nice weekend!  Gerald Olson was given information about Medicaid Managed Care team care coordination services as a part of their Healthy [facility] Medicaid benefit. Gerald Olson verbally consented to engagement with the [facility] Managed Care team.   If you are experiencing a medical emergency, please call 911 or report to your local emergency department or urgent care.   If you have a non-emergency medical problem during routine business hours, please contact your provider's office and ask to speak with a nurse.   For questions related to your Healthy [facility] health plan, please call: [phone] or visit the homepage here: [email or web address]  If you would like to schedule transportation through your Healthy [facility] plan, please call the following number at least 2 days in advance of your appointment: [phone]  For information about your ride after you set it up, call Ride Assist at [phone]. Use this number to activate a Will Call pickup, or if your transportation is late for a scheduled pickup. Use this number, too, if you need to make a change or cancel a previously scheduled reservation.  If you need transportation services right away, call [phone]. The after-hours call center is staffed 24 hours to handle ride assistance and urgent reservation requests (including discharges) 365 days a year. Urgent trips include sick visits, hospital discharge requests and life-sustaining treatment.  Call the [facility] at [phone], at any time, 24 hours a day, 7 days a week. If you are in danger or need immediate medical attention call 911.  If you would like help to quit smoking, call 1-800-QUIT-NOW 708-196-6115) OR Espaol: 1-855-Djelo-Ya (1-700-174-9449) o para ms informacin haga clic aqu or Text READY to 200-400 to register via text  Gerald Olson - following are the goals  we discussed in your visit today:   Goals Addressed             This Visit's Progress    RNCM - Chronic Back Pain Management       Timeframe:  Long-Range Goal Priority:  High Start Date:   05/09/2021                          Expected End Date: ongoing    Patient Goals: Patient will self administer medications as prescribed Patient will attend all scheduled provider appointments Patient will call pharmacy for medication refills Patient will continue to perform ADL's independently Patient will continue to perform IADL's independently Patient will call provider office for new concerns or questions Patient will work with BSW to address care coordination needs and will continue to work with the clinical team to address health care and disease management related needs.   Patient's pain will be managed to a tolerable pain level where patient can be functional with ADL's and IADL's.    07/13/22:  Patient feeling better with addition of Qutenza patch.        RNCM - CML Disease Progression Minimized or Managed       Timeframe:  Long-Range Goal Priority:  High Start Date:     05/09/2021                       Expected End Date: ongoing  Patient Goals: Patient will self administer medications as prescribed Patient will attend all scheduled provider appointments Patient will call pharmacy for medication refills Patient will continue to perform ADL's independently Patient will  continue to perform IADL's independently Patient will call provider office for new concerns or questions Patient will work with BSW to address care coordination needs and will continue to work with the clinical team to address health care and disease management related needs.  111/17/23:  ONC appt 11/18-WNL     RNCM - Glycemic Management & Monitored       Timeframe:  Long-Range Goal Priority:  High Start Date:     05/09/2021                        Expected End Date:  ongoing  Patient Goals: Patient will self  administer medications as prescribed Patient will attend all scheduled provider appointments Patient will call pharmacy for medication refills Patient will continue to perform ADL's independently Patient will continue to perform IADL's independently Patient will call provider office for new concerns or questions Patient will work with BSW to address care coordination needs and will continue to work with the clinical team to address health care and disease management related needs.      Patient monitor blood sugars twice a day at varies times using Accu-check guide and record results. A1C level will remain at 6.0 or below.    07/13/22:  Blood sugars 130-180-has ENDO appt 12/14.     RNCM - Hypertension Monitored and Managed       Timeframe:  Long-Range Goal Priority:  High Start Date:  05/09/2021                           Expected End Date:  ongoing  Patient Goals Patient will self administer medications as prescribed Patient will attend all scheduled provider appointments Patient will call pharmacy for medication refills Patient will continue to perform ADL's independently Patient will continue to perform IADL's independently Patient will call provider office for new concerns or questions Patient will work with BSW to address care coordination needs and will continue to work with the clinical team to address health care and disease management related needs.    Patient will check blood pressure with new blood pressure monitor at least once a week and record readings.          07/13/22:  Blood pressure 128/80 at recent provider appt 10/20-checks occasionally   Patient verbalizes understanding of instructions and care plan provided today and agrees to view in [place]. Active MyChart status and patient understanding of how to access instructions and care plan via MyChart confirmed with patient.     The Managed Medicaid care management team will reach out to the patient again over the next 30  business  days.  The  Patient has been provided with contact information for the Managed Medicaid care management team and has been advised to call with any health related questions or concerns.   Aida Raider RN, BSN [place] Management Coordinator - Managed Medicaid High Risk [phone]   Following is a copy of your plan of care:  Care Plan : RNCM Plan of Care  Updates made by [name], RN since 07/13/2022 12:00 AM     Problem: Chronic Disease Management and Care Coordination Needs for DM, HTN, CML and Back Pain      Long-Range Goal: Plan of Care for Chronic Disease management and Care Coordination Needs (HTN, DM, CML, Chronic Back Pain)   Start Date: 05/09/2021  Expected End Date: 09/13/2022  Priority: High  Note:   Current Barriers:  Knowledge Deficits related to plan of care for management of HTN, DMII, and CML and Chronic Back Pain  Chronic Disease Management support and education needs related to HTN, DMII, and CML and Chronic Back Pain 07/13/22:  Patient states neuropathy pain better with addition of Qutenza patch.  Blood sugars and blood pressure WNL.  Has upcoming appts with PCP, ENDO, and pain management  RNCM Clinical Goal(s):  Patient will verbalize understanding of plan for management of HTN, DMII, and CML and Chronic Back Pain verbalize basic understanding of HTN, DMII, and CML and Chronic Back Pain disease process and self health management plan  take all medications exactly as prescribed and will call provider for medication related questions attend all scheduled medical appointments demonstrate ongoing adherence to prescribed treatment plan for HTN, DMII, and CML and Chronic Back Pain as evidenced by daily monitoring and recording of CBG, adherence to ADA/ carb modified diet, adherence to prescribed medication regimen, contacting provider for new or worsened symptoms or questions  demonstrate ongoing health management  independence  continue to work with RN Care Manager to address care management and care coordination needs related to HTN, DMII, and CML and Chronic Back Pain  work with pharmacist to address complex medication regimen  related to HTN, DMII, and CML and Chronic Back Pain work with Education officer, museum to address financial constraints related to overdue payment of back taxes and potential risk of losing home, Limited social support and counseling for anxiety/stress related to the management of HTN, DMII, and CML and Chronic Back Pain. collaborate with the care management team towards completion of advanced directives  through collaboration with RN Care manager, provider, and care team.   Interventions: Inter-disciplinary care team collaboration (see longitudinal plan of care) Evaluation of current treatment plan related to  self management and patient's adherence to plan as established by provider Collaboration with PCP office for Tramadol refill at patient request.  Diabetes:  (Status: Goal on Track (progressing): YES.) Lab Results  Component Value Date   HGBA1C 6.1 (A) 01/16/2022                                                                            6.6                                    04/04/22 Assessed patient's understanding of A1c goal: <6.5% Continue to provided education to patient about basic DM disease process; Reviewed medications with patient and discussed importance of medication adherence. Counseled on importance of regular laboratory monitoring as prescribed;        Discussed plans with patient for ongoing care management follow up and provided patient with direct contact information for care management team;      Reviewed scheduled/upcoming provider appointments including: See RNCM Clinical Goals above.       Call provider for findings outside established parameters.      Referral made to pharmacy team previously for assistance with complex medication regimen.  Patient doing well  with medication compliance and his understanding of all medications.  Pharmacist referral not needed at this time.  Review of patient status, including review of consultants reports, relevant laboratory and other test results, and medications completed.  Patient understands the importance of drinking low sugar drinks and use of sugar substitutes.       Chronic Myeloid Leukemia (CML)  (Status: Goal on Track (progressing): YES.) Evaluation of current treatment plan related to  CML and patient's adherence to plan as established by provider.  Patient continues to be in remission and the plan is for patient to continue on Gleevec 400 mg daily.  Patient continues to take the [place] with some protein to avoid having diarrhea.  Discussed plans with patient for ongoing care management follow up and provided patient with direct contact information for care management team Reviewed medications with patient and discussed the importance of taking all medications as prescribed; Pharmacy referral for complex medication regimen made previously.  [facility] Pharmacist not needed at this time. 07/13/22: ONC appt 07/04/22-WNL  Hypertension: (Status: Goal on Track (progressing): YES.) Last practice recorded Blood Pressure readings: BP Readings from Last 3 Encounters:  01/16/22 115/80  12/26/21 112/76  09/22/21 (!) 144/90   06/13/22:  BP 145/87 Most recent eGFR/CrCl: No results found for: EGFR  No components found for: CRCL  Evaluation of current treatment plan related to hypertension self management and patient's adherence to plan as established by provider;   Reviewed medications with patient and discussed importance of compliance.  Discussed plans with patient for ongoing care management follow up and provided patient with direct contact information for care management team; Discussed patient's recent blood pressure readings at recent medical office visits.  Reviewed scheduled/upcoming provider appointments  including: See RNCM Clinical Goals above. Patient did obtain a blood pressure monitor through [employer].   Continued to re-enforce education on Orthostatic blood pressure changes that occur normally when changing positions from lying to sitting to standing.  Patient continues to report he does experience some lightheaded with moving from a sitting to a standing position which only last a few seconds.  Patient checks his blood pressure and heart rate at time of episode. Patient suspects caffeine may be making heart rate elevated. Instructed patient to change positions slowly and continue to monitor. Patient verbalized understanding.  Pain:  (Status: Goal on Track (progressing): YES.) Pain assessment performed.  Patient reports chronic neck & back pain. Pain rating is on a Pain Scale of 0 - 10. Currently using Ibuprofen 600 mg PRN during the day and Tramadol at night with fair relief - able to sleep only 2 hours at a stretch.  Pain interferes with sleep due to staying in same position for 2 hours.  Per patient, his pain is either triggered by staying in same position for a prolonged period of time or by moving too much. Medications reviewed.  Reviewed provider established plan for pain management; Discussed importance of adherence to all scheduled medical appointments; Patient continues to report having Neuropathy on his left side of his body (arm and leg) causing numbness and lack of coordination with movement.    Will continue to monitor.     Patient Goals/Self-Care Activities: Patient will self administer medications as prescribed as evidenced by self report/primary caregiver report  Patient will attend all scheduled provider appointments as evidenced by clinician review of documented attendance to scheduled appointments and patient/caregiver report Patient will call pharmacy for medication refills as evidenced by patient report and review of pharmacy fill history as appropriate Patient will  continue to perform ADL's independently as evidenced by patient/caregiver report Patient will  continue to perform IADL's independently as evidenced by patient/caregiver report Patient will call provider office for new concerns or questions as evidenced by review of documented incoming telephone call notes and patient report Patient will work with BSW to address care coordination needs and will continue to work with the clinical team to address health care and disease management related needs as evidenced by documented adherence to scheduled care management/care coordination appointments - check blood sugar at prescribed times: twice daily - enter blood sugar readings and medication or insulin into daily log - take the blood sugar log to all doctor visits - check blood pressure weekly - write blood pressure results in a log or diary - keep a blood pressure log - take blood pressure log to all doctor appointments - call doctor for signs and symptoms of high blood pressure - keep all doctor appointments - take medications for blood pressure exactly as prescribed

## 2022-07-18 ENCOUNTER — Other Ambulatory Visit (HOSPITAL_COMMUNITY): Payer: Self-pay

## 2022-07-20 ENCOUNTER — Other Ambulatory Visit (HOSPITAL_COMMUNITY): Payer: Self-pay

## 2022-07-25 ENCOUNTER — Encounter: Payer: Medicaid Other | Admitting: Physician Assistant

## 2022-08-02 ENCOUNTER — Ambulatory Visit: Payer: Medicaid Other | Admitting: Physical Medicine & Rehabilitation

## 2022-08-02 ENCOUNTER — Encounter: Payer: Medicaid Other | Admitting: Physician Assistant

## 2022-08-03 ENCOUNTER — Encounter: Payer: Self-pay | Admitting: Physical Medicine & Rehabilitation

## 2022-08-03 ENCOUNTER — Other Ambulatory Visit (HOSPITAL_COMMUNITY): Payer: Self-pay

## 2022-08-03 ENCOUNTER — Encounter: Payer: Medicaid Other | Attending: Physical Medicine & Rehabilitation | Admitting: Physical Medicine & Rehabilitation

## 2022-08-03 VITALS — BP 164/91 | HR 91 | Ht 75.0 in | Wt 249.0 lb

## 2022-08-03 DIAGNOSIS — F1210 Cannabis abuse, uncomplicated: Secondary | ICD-10-CM | POA: Insufficient documentation

## 2022-08-03 DIAGNOSIS — Z789 Other specified health status: Secondary | ICD-10-CM | POA: Insufficient documentation

## 2022-08-03 DIAGNOSIS — E1142 Type 2 diabetes mellitus with diabetic polyneuropathy: Secondary | ICD-10-CM | POA: Insufficient documentation

## 2022-08-03 DIAGNOSIS — M47812 Spondylosis without myelopathy or radiculopathy, cervical region: Secondary | ICD-10-CM | POA: Insufficient documentation

## 2022-08-03 DIAGNOSIS — M47816 Spondylosis without myelopathy or radiculopathy, lumbar region: Secondary | ICD-10-CM | POA: Diagnosis not present

## 2022-08-03 MED ORDER — PREGABALIN 100 MG PO CAPS
100.0000 mg | ORAL_CAPSULE | Freq: Two times a day (BID) | ORAL | 6 refills | Status: DC
  Filled 2022-08-03 – 2022-08-11 (×2): qty 30, 15d supply, fill #0
  Filled 2022-09-03: qty 30, 15d supply, fill #1
  Filled 2022-10-18: qty 30, 15d supply, fill #2

## 2022-08-03 MED ORDER — DICLOFENAC SODIUM 50 MG PO TBEC
50.0000 mg | DELAYED_RELEASE_TABLET | Freq: Two times a day (BID) | ORAL | 2 refills | Status: DC
  Filled 2022-08-03 – 2022-08-11 (×2): qty 60, 30d supply, fill #0
  Filled 2022-09-03: qty 60, 30d supply, fill #1
  Filled 2022-10-18: qty 60, 30d supply, fill #2

## 2022-08-03 NOTE — Progress Notes (Signed)
Subjective:    Patient ID: Gerald Olson, male    DOB: 1966-05-24, 56 y.o.   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  Patient reports he had back and neck pain for many years with shooting pain down both legs and altered sensation in his feet.  He also developed diabetic polyneuropathy when he feels like this is his greatest area of pain at this time.  He has chronic burning sensations in his feet that is very severe and has not responded well to oral medications.  Reports his lower back pain and sciatica improved after having his lumbar surgery completed however he continues to have neck pain and he then developed worsening pain in his thoracic spine.  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  His back feels stiff throughout the day and impairs his activities. He reports he has failed multiple medications for his  neuropathy including Topamax, gabapentin, amitriptyline, Cymbalta.  He is not sure if he has used Lyrica in the past.  He uses a natural treatments are better for him and was interested in [place].  He has used tramadol with benefit to his lower back pain, taking it mostly at night because he does not want to have any impairment during the day.  He does take ibuprofen for his back pain with some benefit.  Chiropractic care provided mild benefit to his back pain in the past.  He reports physical therapy did not help and worsened his back pain.  Patient does take occasional cannabis extract for his pain.  He also drinks alcohol 2-3 drinks regularly.   Interval History Gerald Olson is here for follow-up of his chronic pain and completion of Qutenza treatment.  He says he researched Qutenza and has no questions about the procedure.  He continues to have neck and  back pain worse around his thoracic spine felt to be related to DISH.  He has been taking ibuprofen for this pain.  He also uses wild lettuce as a pain treatment.  He has not used tramadol in several months and reports it was not providing much benefit.  He continues to use occasional alcohol and THC products.  Patient reports he has used Voltaren tablets in the past for his back pain and they worked better than ibuprofen.  He reports that he tried Lyrica about 10 years ago for his back pain and it did not provide significant improvement.  He has not tried this for his pain in his feet and is interested in trying this.  Ibuprofen  Burning after 2 days after qutenza-  CBD balm -helped Qutenza- helped for several weeks Lyrica  75 mg a day Increase lyrica to '100mg'$  daily   Pain Inventory Average Pain 6 Pain Right Now 9 My pain is sharp, burning, dull, stabbing, and aching  In the last 24 hours, has pain interfered with the following? General activity 9 Relation with others 5 Enjoyment of life 10 What TIME of day is your pain at its worst? morning  and daytime Sleep (in general) Poor  Pain is worse with: walking, bending, standing, and some activites Pain improves with: heat/ice and medication Relief from Meds: 5  Family History  Problem Relation Age of Onset   Aneurysm Father    Drug abuse  Sister    Multiple sclerosis Sister    Suicidality Maternal Aunt    Emphysema Maternal Grandfather    Diabetes Neg Hx    Social History   Socioeconomic History   Marital status: Single    Spouse name: Not on file   Number of children: Not on file   Years of education: 50   Highest education level: Master's degree (e.g., MA, MS, MEng, MEd, MSW, MBA)  Occupational History   Not on file  Tobacco Use   Smoking status: Former    Types: Cigarettes    Quit date: 06/25/2003    Years since quitting: 19.1   Smokeless tobacco: Never   Tobacco comments:    Currently vapes  Vaping Use   Vaping  Use: Every day  Substance and Sexual Activity   Alcohol use: Yes    Alcohol/week: 14.0 - 21.0 standard drinks of alcohol    Types: 14 - 21 Cans of beer per week    Comment: 2-3 beers per night on average; sometimes up to 6   Drug use: Yes    Types: Marijuana    Comment: 1/week to help sleep; edibles, does not smoke   Sexual activity: Yes  Other Topics Concern   Not on file  Social History Narrative   Not on file   Social Determinants of Health   Financial Resource Strain: High Risk (05/09/2021)   Overall Financial Resource Strain (CARDIA)    Difficulty of Paying Living Expenses: Hard  Food Insecurity: No Food Insecurity (06/13/2022)   Hunger Vital Sign    Worried About Running Out of Food in the Last Year: Never true    Ran Out of Food in the Last Year: Never true  Transportation Needs: No Transportation Needs (03/28/2022)   PRAPARE - Hydrologist (Medical): No    Lack of Transportation (Non-Medical): No  Physical Activity: Insufficiently Active (05/09/2021)   Exercise Vital Sign    Days of Exercise per Week: 3 days    Minutes of Exercise per Session: 10 min  Stress: Stress Concern Present (07/03/2021)   [place]    Feeling of Stress : Very much  Social Connections: Socially Isolated (05/09/2021)   Social Connection and Isolation Panel [NHANES]    Frequency of Communication with Friends and Family: More than three times a week    Frequency of Social Gatherings with Friends and Family: More than three times a week    Attends Religious Services: Never    Marine scientist or Organizations: No    Attends Archivist Meetings: Never    Marital Status: Never married   Past Surgical History:  Procedure Laterality Date   CHOLECYSTECTOMY N/A 06/02/2018   Procedure: LAPAROSCOPIC CHOLECYSTECTOMY;  Surgeon: Vickie Epley, MD;  Location: [facility];  Service: General;   Laterality: N/A;   KNEE ARTHROSCOPY     30 Years ago   lumbar five-sacral one posterior lumbar interbody fusion  01/24/2021   Past Surgical History:  Procedure Laterality Date   CHOLECYSTECTOMY N/A 06/02/2018   Procedure: LAPAROSCOPIC CHOLECYSTECTOMY;  Surgeon: Vickie Epley, MD;  Location: [facility];  Service: General;  Laterality: N/A;   KNEE ARTHROSCOPY     30 Years ago   lumbar five-sacral one posterior lumbar interbody fusion  01/24/2021   Past Medical History:  Diagnosis Date   ADHD (attention deficit hyperactivity disorder)    Diagnosed during teenage years   Alcohol  use disorder    On average 14-21 drinks per week   Alcoholic cirrhosis of liver    Per patient - condition is not related to moderate alcohol use but rather prolonged NSAID use and recent abuse.   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[place]   Major depressive disorder    Other spondylosis with radiculopathy, cervical region    Retinopathy    Right eye; per patient report   Type 2 diabetes mellitus with hyperglycemia    BP (!) 164/91   Pulse 91   Ht '6\' 3"'$  (1.905 m)   Wt 249 lb (112.9 kg)   SpO2 97%   BMI 31.12 kg/m   Opioid Risk Score:   Fall Risk Score:  `1  Depression screen PHQ 2/9     06/15/2022    1:32 PM 05/18/2022    2:43 PM 10/17/2021    1:17 PM 09/20/2021    3:27 PM 06/20/2021    1:28 PM 05/26/2021    3:22 PM 05/24/2021   10:04 AM  Depression screen PHQ 2/9  Decreased Interest '3 3  3 3 3 2  '$ Down, Depressed, Hopeless '3 3  3 2 2 1  '$ PHQ - 2 Score '6 6  6 5 5 3  '$ Altered sleeping  '3  3 3 3 1  '$ Tired, decreased energy  '3  3 3 3  '$ 0  Change in appetite  '3  3 2 1 '$ 0  Feeling bad or failure about yourself   '3  3 1 3 1  '$ Trouble concentrating  '2  3 1 2 '$ 0  Moving slowly or fidgety/restless  1  0 0 0   Suicidal thoughts  0  1 0 0 0  PHQ-9 Score  '21  22 15 17 5  '$ Difficult doing work/chores  Somewhat difficult  Not difficult at all Somewhat difficult Very difficult Somewhat difficult     Information is confidential and restricted. Go to Review Flowsheets to unlock data.      Review of Systems  Musculoskeletal:  Positive for back pain.       Bilateral leg pain  All other systems reviewed and are negative.     Objective:   Physical Exam   Gen: no distress, normal appearing HEENT: oral mucosa pink and moist, NCAT Cardio: Reg rate Chest: normal effort, normal rate of breathing Abd: soft, non-distended Ext: no edema Psych: pleasant, normal affect Skin: intact Neuro: Alert , normal speech language, cranial nerves II through XII grossly intact  Sensation decreased to light touch in a stocking glove distribution in bilateral lower extremities below the knee Strength 5/5 in all 4 extremities musculoskeletal:   Decreased motion at the lumbar and cervical spine in all directions Tenderness throughout paraspinals throughout L spine, T spine and C spine    L spine 01/24/2021 Intraoperative images document instrumented posterolateral and interbody PLIF L5-S1. The L5 pedicle screws are directed towards the superior endplate. Graft markers project in the interspace. Alignment preserved..   IMPRESSION: Limited intraoperative lateral view of the lumbar spine obtained for  localization purposes     L spine MRI 11/22/2020 COMPARISON:  MRI lumbar spine July 20, 2018.   FINDINGS: Segmentation:  Standard.   Alignment:  Grade 1 anterolisthesis of L5 on S1, similar to prior.   Vertebrae: Vertebral body heights are maintained. No specific evidence of acute fracture, discitis/osteomyelitis, or suspicious bone lesion.  Degenerative/discogenic endplate signal changes about the right eccentric L5-S1 disc.   Conus medullaris and cauda equina: Conus extends to the T12-L1 level. Conus appears normal.   Paraspinal and other soft tissues: Unremarkable.   Disc levels:   T12-L1: Only imaged sagittally. Disc bulging and facet hypertrophy without evidence of significant canal or foraminal stenosis.   L1-L2: Small broad disc bulge with small superimposed left subarticular disc protrusion. Mild facet hypertrophy. No significant canal or foraminal stenosis.   L2-L3: No significant disc protrusion, foraminal stenosis, or canal stenosis.   L3-L4: Similar left foraminal/far lateral disc protrusion which contacts the exiting/exited left L3 nerve root without displacement (series 8, image 20). No significant canal or foraminal stenosis.   L4-L5: Broad disc bulge with superimposed left foraminal disc protrusion with annular fissure. Mild bilateral subarticular recess stenosis. No significant central canal stenosis. Mild left foraminal stenosis, similar to prior. No significant right foraminal stenosis.   L5-S1: Grade 1 anterolisthesis of L5 on S1. Uncovering of the disc with superimposed disc bulge and bilateral foraminal disc protrusions. Severe bilateral facet arthropathy/hypertrophy. Similar right greater than left subarticular recess stenosis with possible impingement of the descending right S1 nerve root. Similar moderate bilateral foraminal stenosis.   IMPRESSION: 1. Similar grade 1 anterolisthesis of L5 on S1 with right greater than left subarticular recess stenosis, possible impingement of the descending right S1 nerve root, and moderate right greater than left foraminal stenosis. 2. Similar disc bulge with annular fissure on the left at L4-L5 with mild left foraminal stenosis and mild bilateral subarticular recess stenosis. 3. Similar left foraminal/far lateral disc protrusion at L3-L4  which contacts the exiting/exited left L3 nerve root, unchanged.       C spine MRI 05/29/2020 FINDINGS: Alignment: Straightening of the normal cervical lordosis. One or 2 mm of degenerative anterolisthesis at C7-T1.   Vertebrae: No fracture or primary bone lesion.   Cord: No primary cord lesion.  See below regarding stenosis.   Posterior Fossa, vertebral arteries, paraspinal tissues: Negative   Disc levels:   Foramen magnum is widely patent. No significant finding at the C1-2 level.   C2-3: Endplate osteophytes and bulging of the disc. No compressive central canal stenosis. Mild foraminal narrowing on the right.   C3-4: Endplate osteophytes and bulging of the disc. AP diameter of the canal in the midline 9.3 mm. Bilateral foraminal encroachment right worse than left. Either C4 nerve could be affected.   C4-5: Endplate osteophytes and bulging of the disc. AP diameter of the canal in the midline 7.6 mm. Slight triangulation of the cord without abnormal cord signal. Bilateral bony foraminal stenosis could affect either C5 nerve.   C5-6: Spondylosis with endplate osteophytes and protruding disc material more prominent towards the left. AP diameter of the canal in the midline 6.8 mm. Effacement of the subarachnoid space and some cord deformity particularly on the left. No conclusive abnormal T2 signal within the cord. Bilateral foraminal stenosis could compress either or both C6 nerves.   C6-7: Endplate osteophytes and bulging of the disc. AP diameter of the canal in the midline 8.2 mm. No cord deformity. Bilateral foraminal stenosis left worse than right. Either  C7 nerve could be affected, particularly the left.   C7-T1: Bilateral facet arthropathy with 1 or 2 mm of anterolisthesis. No disc pathology. No compressive canal stenosis. Bilateral foraminal stenosis could affect either C8 nerve. Edematous change of the facet joints could certainly relate to regional pain.    IMPRESSION: C2-3: Mild foraminal narrowing on the right.   C3-4: Spondylosis with bilateral foraminal narrowing right worse than left. Either C4 nerve could be affected.   C4-5: Spondylosis with bilateral foraminal narrowing. Either C5 nerve could be affected. Canal narrowing with AP diameter of 7.6 mm. Slight deformity of the cord without abnormal cord signal.   C5-6: Spondylosis with bilateral foraminal narrowing. Findings more pronounced on the left. Either C6 nerve could be affected. Canal narrowing worse on the left with deformity of the cord. I do not see conclusive T2 signal within the cord. AP diameter of the canal in the midline only 6.8 mm. The patient could be at risk of developing myelopathy at this level.   C6-7: Spondylosis with bilateral foraminal narrowing. Either C7 nerve could be affected. Somewhat worse on the left.   C7-T1: Facet arthropathy with 1 or 2 mm of anterolisthesis and edematous change. Bilateral foraminal stenosis could affect either C8 nerve. The facet arthropathy is likely painful.      Assessment & Plan:   Diabetic polyneuropathy due to diabetes mellitus -Lyrica dose increased to '100mg'$  BID from '75mg'$  BID -Discussed importance of blood glucose control to prevent worsening of his polyneuropathy -Will continue Qutenza treatment, next visit 3 months from prior treatment   Back and neck pain.  Imaging has shown lumbar and cervical spondylosis and he is status post PLIF L5-S1.  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  -Primary use of tramadol however we will not initiate today due to current use of THC products and alcohol, discussed cessation of this products. -We reorder Voltaren 50 mg twice daily, discussed stopping ibuprofen and any other NSAIDs

## 2022-08-04 ENCOUNTER — Other Ambulatory Visit (HOSPITAL_COMMUNITY): Payer: Self-pay

## 2022-08-09 ENCOUNTER — Ambulatory Visit (INDEPENDENT_AMBULATORY_CARE_PROVIDER_SITE_OTHER): Payer: Medicaid Other | Admitting: Internal Medicine

## 2022-08-09 ENCOUNTER — Encounter: Payer: Self-pay | Admitting: Internal Medicine

## 2022-08-09 ENCOUNTER — Telehealth: Payer: Self-pay

## 2022-08-09 VITALS — BP 120/78 | HR 94 | Ht 75.0 in | Wt 240.8 lb

## 2022-08-09 DIAGNOSIS — E785 Hyperlipidemia, unspecified: Secondary | ICD-10-CM

## 2022-08-09 DIAGNOSIS — E1165 Type 2 diabetes mellitus with hyperglycemia: Secondary | ICD-10-CM

## 2022-08-09 LAB — POCT GLYCOSYLATED HEMOGLOBIN (HGB A1C): Hemoglobin A1C: 7.4 % — AB (ref 4.0–5.6)

## 2022-08-09 MED ORDER — DAPAGLIFLOZIN PROPANEDIOL 5 MG PO TABS
5.0000 mg | ORAL_TABLET | Freq: Every day | ORAL | 3 refills | Status: DC
  Filled 2022-08-09 – 2022-08-16 (×2): qty 90, 90d supply, fill #0
  Filled 2022-11-13: qty 90, 90d supply, fill #1
  Filled 2023-03-01: qty 90, 90d supply, fill #2

## 2022-08-09 NOTE — Patient Instructions (Addendum)
Please continue: - Lantus 7 units daily in am  Add: - Farxiga 5 mg daily in am Let me know if this is not covered.  Please return in 3 months with your sugar log.

## 2022-08-09 NOTE — Progress Notes (Signed)
Patient ID: Gerald Olson, male   DOB: [birth year], 56 y.o.   MRN: [id number]  HPI: Gerald Olson is a 56 y.o.-year-old male, returning for follow-up for DM2 dx in 2019, with prediabetes 2006, insulin-dependent since 2020, uncontrolled, with complications (peripheral artery disease, diabetic retinopathy, peripheral neuropathy). Pt. previously saw Dr. Loanne [name], but last visit with me 4 months ago.  Interim history: No increased urination, blurry vision, nausea, chest pain. He has back pain-seen in the pain clinic. He gained weight since last OV. He has been having running water in the house for several months now. He has a very limited budget and has problems affording fresh vegetables and other foods.  Reviewed HbA1c: Lab Results  Component Value Date   HGBA1C 6.6 (A) 04/04/2022   HGBA1C 6.1 (A) 01/16/2022   HGBA1C 6.0 (A) 12/26/2021   HGBA1C 6.0 (A) 09/22/2021   HGBA1C 5.9 (A) 06/19/2021   HGBA1C 5.9 (A) 04/19/2021   HGBA1C 7.2 (H) 01/18/2021   HGBA1C 7.0 (A) 09/05/2020   HGBA1C 7.4 (H) 06/14/2020   HGBA1C 7.3 (A) 05/04/2020   Pt is on a regimen of: - Lantus 7 units in  am 6/7 days >> daily At our visit in 03/2022, I suggested Farxiga, but he is not taking this now because this was not covered.  Pt was checking his sugars ~1x a day - not checking now. From 2 mo ago: - am: 120-145 >> 112, 142 - 2h after b'fast: n/c - before lunch: n/c - 2h after lunch: n/c - before dinner: 160-190 >> n/c - 2h after dinner: n/c - bedtime: n/c - nighttime: n/c Lowest sugar was 110 >> ?; he has hypoglycemia awareness at 90.  Highest sugar was 190 >> ?Marland Kitchen  Glucometer: Accu-Chek guide  Pt's meals are: - Breakfast: none - Lunch: none - Dinner: canned soup, hamburger helper or other meat, rice, beans, corn - Snacks: tea with stevia  - + CKD - saw [facility], last BUN/creatinine:  Lab Results  Component Value Date   BUN 19 06/20/2022   BUN 14 03/26/2022   CREATININE 0.97 06/20/2022    CREATININE 0.93 03/26/2022    Ref Range & Units 07/13/2021  Creatinine, Ur 20 - 320 mg/dL 189   Urine Protein/Creatinine Ratio 25 - 148 mg/g creat 407 High    Protein/Creatinine Ratio, Urine 0.025 - 0.148 mg/mg creat 0.407 High    Protein Urine Random 5 - 25 mg/dL 77 High    On lisinopril 10 mg daily.  -+ Dyslipidemia; last set of lipids: Lab Results  Component Value Date   CHOL 122 06/14/2020   HDL 34 (L) 06/14/2020   LDLCALC 72 06/14/2020   TRIG 77 06/14/2020   CHOLHDL 3.6 06/14/2020  He is not on a statin.  - last eye exam was on 10/27/2021. + NPDR OU, without macular edema.  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  - + numbness and tingling in his feet.  Last foot exam 06/19/2021. He is taking collagen + peptides + retinol. He also takes B vitamins with Benfotiamine for neuropathy.  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  ROS: + see HPI  Past Medical History:  Diagnosis Date   ADHD (attention deficit hyperactivity disorder)    Diagnosed during teenage years   Alcohol use disorder    On average 14-21 drinks per week   Alcoholic cirrhosis of liver    Per patient - condition is  not related to moderate alcohol use but rather prolonged NSAID use and recent abuse.   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[place]   Major depressive disorder    Other spondylosis with radiculopathy, cervical region    Retinopathy    Right eye; per patient report   Type 2 diabetes mellitus with hyperglycemia    Past  Surgical History:  Procedure Laterality Date   CHOLECYSTECTOMY N/A 06/02/2018   Procedure: LAPAROSCOPIC CHOLECYSTECTOMY;  Surgeon: Vickie Epley, MD;  Location: [facility] ORS;  Service: General;  Laterality: N/A;   KNEE ARTHROSCOPY     30 Years ago   lumbar five-sacral one posterior lumbar interbody fusion  01/24/2021   Social History   Socioeconomic History   Marital status: Single    Spouse name: Not on file   Number of children: Not on file   Years of education: 18   Highest education level: Master's degree (e.g., MA, MS, MEng, MEd, MSW, MBA)  Occupational History   Not on file  Tobacco Use   Smoking status: Former    Types: Cigarettes    Quit date: 06/25/2003    Years since quitting: 19.1   Smokeless tobacco: Never   Tobacco comments:    Currently vapes  Vaping Use   Vaping Use: Every day  Substance and Sexual Activity   Alcohol use: Yes    Alcohol/week: 14.0 - 21.0 standard drinks of alcohol    Types: 14 - 21 Cans of beer per week    Comment: 2-3 beers per night on average; sometimes up to 6   Drug use: Yes    Types: Marijuana    Comment: 1/week to help sleep; edibles, does not smoke   Sexual activity: Yes  Other Topics Concern   Not on file  Social History Narrative   Not on file   Social Determinants of Health   Financial Resource Strain: High Risk (05/09/2021)   Overall Financial Resource Strain (CARDIA)    Difficulty of Paying Living Expenses: Hard  Food Insecurity: No Food Insecurity (06/13/2022)   Hunger Vital Sign    Worried About Running Out of Food in the Last Year: Never true    Ran Out of Food in the Last Year: Never true  Transportation Needs: No Transportation Needs (03/28/2022)   PRAPARE - Hydrologist (Medical): No    Lack of Transportation (Non-Medical): No  Physical Activity: Insufficiently Active (05/09/2021)   Exercise Vital Sign    Days of Exercise per Week: 3 days    Minutes of Exercise per Session: 10 min   Stress: Stress Concern Present (07/03/2021)   [place]    Feeling of Stress : Very much  Social Connections: Socially Isolated (05/09/2021)   Social Connection and Isolation Panel [NHANES]    Frequency of Communication with Friends and Family: More than three times a week    Frequency of Social Gatherings with Friends and Family: More than three times a week    Attends Religious Services: Never  Active Member of Clubs or Organizations: No    Attends Archivist Meetings: Never    Marital Status: Never married  Intimate Partner Violence: Not At Risk (07/13/2022)   Humiliation, Afraid, Rape, and Kick questionnaire    Fear of Current or Ex-Partner: No    Emotionally Abused: No    Physically Abused: No    Sexually Abused: No   Current Outpatient Medications on File Prior to Visit  Medication Sig Dispense Refill   Blood Pressure Monitoring (BLOOD PRESSURE MONITOR AUTOMAT) DEVI Check blood pressure in AM and PM daily 1 each 0   dapagliflozin propanediol (FARXIGA) 5 MG TABS tablet Take 1 tablet (5 mg total) by mouth daily before breakfast. (Patient not taking: Reported on 07/04/2022) 90 tablet 3   diclofenac (VOLTAREN) 50 MG EC tablet Take 1 tablet (50 mg total) by mouth 2 (two) times daily. 60 tablet 2   glucose blood (ACCU-CHEK GUIDE) test strip USE AS DIRECTED 2 TIMES DAILY 180 strip 0   imatinib (GLEEVEC) 400 MG tablet Take 1 tablet (400 mg total) by mouth daily. 30 tablet 5   insulin glargine (LANTUS SOLOSTAR) 100 UNIT/ML Solostar Pen Inject 7 Units into the skin every morning. 15 mL 3   Insulin Pen Needle (UNIFINE PENTIPS) 32G X 4 MM MISC Use to inject Victoza once a day 100 each 11   lisinopril (ZESTRIL) 10 MG tablet Take 1 tablet by mouth daily. 90 tablet 3   pregabalin (LYRICA) 100 MG capsule Take 1 capsule (100 mg total) by mouth 2 (two) times daily. 30 capsule 6   [DISCONTINUED] DULoxetine (CYMBALTA) 60 MG  capsule Take 1 capsule (60 mg total) by mouth 2 (two) times daily. (Patient not taking: Reported on 08/09/2020) 60 capsule 1   No current facility-administered medications on file prior to visit.   No Known Allergies Family History  Problem Relation Age of Onset   Aneurysm Father    Drug abuse Sister    Multiple sclerosis Sister    Suicidality Maternal Aunt    Emphysema Maternal Grandfather    Diabetes Neg Hx    PE: BP 120/78 (BP Location: Left Arm, Patient Position: Sitting, Cuff Size: Normal)   Pulse 94   Ht _0  (1.905 m)   Wt 240 lb 12.8 oz (109.2 kg)   SpO2 98%   BMI 30.10 kg/m  Wt Readings from Last 3 Encounters:  08/09/22 240 lb 12.8 oz (109.2 kg)  08/03/22 249 lb (112.9 kg)  06/15/22 241 lb 3.2 oz (109.4 kg)   Constitutional: overweight, in NAD Eyes: no exophthalmos ENT: no thyromegaly, no cervical lymphadenopathy Cardiovascular: tachycardia, RR, No MRG Respiratory: CTA B Musculoskeletal: no deformities Skin: no rashes Neurological: no tremor with outstretched hands  ASSESSMENT: 1. DM2, insulin-dependent, uncontrolled, with complications - PAD - DR - PN  2.  Dyslipidemia  PLAN:  1. Patient with longstanding, uncontrolled, type 2 diabetes, on oral antidiabetic regimen, with good control at last visit, but worsening in the last 6.  At last visit, he was telling me that he relaxed his diet and at this visit, he mentions that he is still not able to eat a good diet beyond 1 week of every month, as he runs out of money.  Also, in the last 2 months, he has not been checking his blood sugars.  He did not have running water in the house.  He plans to have this restarted in the next 2 months. -At today's visit, he mentions that he was  not able to start Farxiga at last visit as this was not covered.  However, he would not want me to send a new prescription of Wilder Glade to his pharmacy as he feels that it may be covered at this time.  If not, we gave him paperwork for the  patient assistance program for AstraZeneca.  We could also try Jardiance, if the Wilder Glade is not covered.  Of note, he tried to obtain farxiga before through the patient assistance program, but he did not hear back from them, despite the fact that he met the criteria.  I advised him that if this happens, to call the number on the application. - I suggested to:  Patient Instructions  Please continue: - Lantus 7 units daily in am  Add: - Farxiga 5 mg daily in am  Please return in 3 months with your sugar log.   - we checked his HbA1c: 7.4% (higher) - advised to check sugars at different times of the day - 1x a day, rotating check times - advised for yearly eye exams >> he is UTD - at last visit, we wanted me to look at his left halluceal toenail.  This appeared to be atrophic, with ridges, but new nail is growing in the nailbed.  - return to clinic in 3-4 months  2.  Dyslipidemia -Reviewed latest lipid panel from 05/2020: LDL above our target of less than 55 due to cardiovascular disease, HDL low: Lab Results  Component Value Date   CHOL 122 06/14/2020   HDL 34 (L) 06/14/2020   LDLCALC 72 06/14/2020   TRIG 77 06/14/2020   CHOLHDL 3.6 06/14/2020  -He did not stop at the lab at last visit...  -He has an appointment with PCP coming up.  He did not check it at that time, will check this at next visit. -he is not on a statin  [name], MD PhD [facility]

## 2022-08-09 NOTE — Telephone Encounter (Signed)
Approved 08/09/22-08/09/23 PA Case# [id number].

## 2022-08-10 ENCOUNTER — Other Ambulatory Visit (HOSPITAL_COMMUNITY): Payer: Self-pay

## 2022-08-11 ENCOUNTER — Other Ambulatory Visit (HOSPITAL_COMMUNITY): Payer: Self-pay

## 2022-08-13 ENCOUNTER — Other Ambulatory Visit: Payer: Self-pay

## 2022-08-14 ENCOUNTER — Other Ambulatory Visit: Payer: Self-pay

## 2022-08-14 ENCOUNTER — Other Ambulatory Visit (HOSPITAL_COMMUNITY): Payer: Self-pay

## 2022-08-15 ENCOUNTER — Encounter: Payer: Self-pay | Admitting: Obstetrics and Gynecology

## 2022-08-15 ENCOUNTER — Other Ambulatory Visit: Payer: Medicaid Other | Admitting: Obstetrics and Gynecology

## 2022-08-15 NOTE — Patient Instructions (Signed)
Hi Mr. Deman, thanks for speaking with me-have a wonderful holiday!!  Mr. Moore was given information about Medicaid Managed Care team care coordination services as a part of their Healthy [facility] Medicaid benefit. [name] verbally consented to engagement with the [facility] Managed Care team.   If you are experiencing a medical emergency, please call 911 or report to your local emergency department or urgent care.   If you have a non-emergency medical problem during routine business hours, please contact your provider's office and ask to speak with a nurse.   For questions related to your Healthy [facility] health plan, please call: [phone] or visit the homepage here: [email or web address]  If you would like to schedule transportation through your Healthy [facility] plan, please call the following number at least 2 days in advance of your appointment: [phone]  For information about your ride after you set it up, call Ride Assist at [phone]. Use this number to activate a Will Call pickup, or if your transportation is late for a scheduled pickup. Use this number, too, if you need to make a change or cancel a previously scheduled reservation.  If you need transportation services right away, call [phone]. The after-hours call center is staffed 24 hours to handle ride assistance and urgent reservation requests (including discharges) 365 days a year. Urgent trips include sick visits, hospital discharge requests and life-sustaining treatment.  Call the [place] at [phone], at any time, 24 hours a day, 7 days a week. If you are in danger or need immediate medical attention call 911.  If you would like help to quit smoking, call 1-800-QUIT-NOW 7270467625) OR Espaol: 1-855-Djelo-Ya (6-301-601-0932) o para ms informacin haga clic aqu or Text READY to 200-400 to register via text  Mr. Pucci - following are the  goals we discussed in your visit today:   Goals Addressed             This Visit's Progress    RNCM - Chronic Back Pain Management       Timeframe:  Long-Range Goal Priority:  High Start Date:   05/09/2021                          Expected End Date: ongoing  Patient Goals: Patient will self administer medications as prescribed Patient will attend all scheduled provider appointments Patient will call pharmacy for medication refills Patient will continue to perform ADL's independently Patient will continue to perform IADL's independently Patient will call provider office for new concerns or questions Patient will work with BSW to address care coordination needs and will continue to work with the clinical team to address health care and disease management related needs.   Patient's pain will be managed to a tolerable pain level where patient can be functional with ADL's and IADL's.    08/15/22:  Seen by pain management 12/8    RNCM - CML Disease Progression Minimized or Managed       Timeframe:  Long-Range Goal Priority:  High Start Date:     05/09/2021                       Expected End Date: ongoing Patient Goals: Patient will self administer medications as prescribed Patient will attend all scheduled provider appointments Patient will call pharmacy for medication refills Patient will continue to perform ADL's independently Patient will continue to perform IADL's independently Patient will call provider office for  new concerns or questions Patient will work with BSW to address care coordination needs and will continue to work with the clinical team to address health care and disease management related needs.  08/15/22:  followed by Adella Nissen    RNCM - Glycemic Management & Monitored       Timeframe:  Long-Range Goal Priority:  High Start Date:     05/09/2021                        Expected End Date:  ongoing  Patient Goals: Patient will self administer medications as  prescribed Patient will attend all scheduled provider appointments Patient will call pharmacy for medication refills Patient will continue to perform ADL's independently Patient will continue to perform IADL's independently Patient will call provider office for new concerns or questions Patient will work with BSW to address care coordination needs and will continue to work with the clinical team to address health care and disease management related needs.      Patient monitor blood sugars twice a day at varies times using Accu-check guide and record results. A1C level will remain at 6.0 or below.    08/15/22:  Patient hasn't checked blood sugars since November.  A1C-7.4 on 08/09/22.    RNCM - Hypertension Monitored and Managed       Timeframe:  Long-Range Goal Priority:  High Start Date:  05/09/2021                           Expected End Date:  ongoing  Patient Goals Patient will self administer medications as prescribed Patient will attend all scheduled provider appointments Patient will call pharmacy for medication refills Patient will continue to perform ADL's independently Patient will continue to perform IADL's independently Patient will call provider office for new concerns or questions Patient will work with BSW to address care coordination needs and will continue to work with the clinical team to address health care and disease management related needs.    Patient will check blood pressure with new blood pressure monitor at least once a week and record readings.          08/15/22:  Patient up to date on all appts, follow ups scheduled.   Patient verbalizes understanding of instructions and care plan provided today and agrees to view in [place]. Active MyChart status and patient understanding of how to access instructions and care plan via MyChart confirmed with patient.     The Managed Medicaid care management team will reach out to the patient again over the next 30 business   days.  The  Patient  has been provided with contact information for the Managed Medicaid care management team and has been advised to call with any health related questions or concerns.   Aida Raider RN, BSN [place] Management Coordinator - Managed Medicaid High Risk [phone]   Following is a copy of your plan of care:  Care Plan : RNCM Plan of Care  Updates made by [name], RN since 08/15/2022 12:00 AM     Problem: Chronic Disease Management and Care Coordination Needs for DM, HTN, CML and Back Pain      Long-Range Goal: Plan of Care for Chronic Disease management and Care Coordination Needs (HTN, DM, CML, Chronic Back Pain)   Start Date: 05/09/2021  Expected End Date: 11/14/2022  Priority: High  Note:   Current Barriers:  Knowledge Deficits related to plan of care for management of HTN, DMII, and CML and Chronic Back Pain  Chronic Disease Management support and education needs related to HTN, DMII, and CML and Chronic Back Pain 08/15/22:  Patient with SDOH concerns-needs resources-referral placed.  Recent appointments with ENDO, pain management-has f/u appts scheduled.  Continues group therapy once a month.  Not checking blood sugars.  BP WNL per patient.  RNCM Clinical Goal(s):  Patient will verbalize understanding of plan for management of HTN, DMII, and CML and Chronic Back Pain verbalize basic understanding of HTN, DMII, and CML and Chronic Back Pain disease process and self health management plan  take all medications exactly as prescribed and will call provider for medication related questions attend all scheduled medical appointments demonstrate ongoing adherence to prescribed treatment plan for HTN, DMII, and CML and Chronic Back Pain as evidenced by daily monitoring and recording of CBG, adherence to ADA/ carb modified diet, adherence to prescribed medication regimen, contacting provider for new or worsened symptoms or questions   demonstrate ongoing health management independence  continue to work with RN Care Manager to address care management and care coordination needs related to HTN, DMII, and CML and Chronic Back Pain  work with pharmacist to address complex medication regimen  related to HTN, DMII, and CML and Chronic Back Pain work with Education officer, museum to address financial constraints related to overdue payment of back taxes and potential risk of losing home, Limited social support and counseling for anxiety/stress related to the management of HTN, DMII, and CML and Chronic Back Pain. collaborate with the care management team towards completion of advanced directives  through collaboration with RN Care manager, provider, and care team.   Interventions: Inter-disciplinary care team collaboration (see longitudinal plan of care) Evaluation of current treatment plan related to  self management and patient's adherence to plan as established by provider Collaboration with PCP office for Tramadol refill at patient request. BSW referral for resources Collaborated with BSW  Diabetes:  (Status: Goal on Track (progressing): YES.) Lab Results  Component Value Date   HGBA1C 6.1 (A) 01/16/2022                                                                            6.6                                    04/04/22                                                                           7.3                                     08/09/22 Assessed patient's understanding of A1c goal: <6.5% Continue to provided education to patient about  basic DM disease process; Reviewed medications with patient and discussed importance of medication adherence. Counseled on importance of regular laboratory monitoring as prescribed;        Discussed plans with patient for ongoing care management follow up and provided patient with direct contact information for care management team;      Reviewed scheduled/upcoming provider appointments including:  See RNCM Clinical Goals above.       Call provider for findings outside established parameters.      Referral made to pharmacy team previously for assistance with complex medication regimen.  Patient doing well with medication compliance and his understanding of all medications.  Pharmacist referral not needed at this time.       Review of patient status, including review of consultants reports, relevant laboratory and other test results, and medications completed.  Patient understands the importance of drinking low sugar drinks and use of sugar substitutes.       Chronic Myeloid Leukemia (CML)  (Status: Goal on Track (progressing): YES.) Evaluation of current treatment plan related to  CML and patient's adherence to plan as established by provider.  Patient continues to be in remission and the plan is for patient to continue on Gleevec 400 mg daily.  Patient continues to take the [place] with some protein to avoid having diarrhea.  Discussed plans with patient for ongoing care management follow up and provided patient with direct contact information for care management team Reviewed medications with patient and discussed the importance of taking all medications as prescribed; Pharmacy referral for complex medication regimen made previously.  [facility] Pharmacist not needed at this time. 07/13/22: ONC appt 07/04/22-WNL  Hypertension: (Status: Goal on Track (progressing): YES.) Last practice recorded Blood Pressure readings: BP Readings from Last 3 Encounters:  01/16/22 115/80  12/26/21 112/76  09/22/21 (!) 144/90   06/13/22:  BP 145/87   07/30/22:   BP 120/78 Most recent eGFR/CrCl: No results found for: EGFR  No components found for: CRCL  Evaluation of current treatment plan related to hypertension self management and patient's adherence to plan as established by provider;   Reviewed medications with patient and discussed importance of compliance.  Discussed plans with patient for ongoing care  management follow up and provided patient with direct contact information for care management team; Discussed patient's recent blood pressure readings at recent medical office visits.  Reviewed scheduled/upcoming provider appointments including: See RNCM Clinical Goals above. Patient did obtain a blood pressure monitor through [employer].   Continued to re-enforce education on Orthostatic blood pressure changes that occur normally when changing positions from lying to sitting to standing.  Patient continues to report he does experience some lightheaded with moving from a sitting to a standing position which only last a few seconds.  Patient checks his blood pressure and heart rate at time of episode. Patient suspects caffeine may be making heart rate elevated. Instructed patient to change positions slowly and continue to monitor. Patient verbalized understanding.  Pain:  (Status: Goal on Track (progressing): YES.) Pain assessment performed.  Patient reports chronic neck & back pain. Pain rating is on a Pain Scale of 0 - 10. Currently using Ibuprofen 600 mg PRN during the day and Tramadol at night with fair relief - able to sleep only 2 hours at a stretch.  Pain interferes with sleep due to staying in same position for 2 hours.  Per patient, his pain is either triggered by staying in same position for a prolonged period of time or by  moving too much. Medications reviewed.  Reviewed provider established plan for pain management; Discussed importance of adherence to all scheduled medical appointments; Patient continues to report having Neuropathy on his left side of his body (arm and leg) causing numbness and lack of coordination with movement.    Will continue to monitor.    Pain better with Qutenza patch  Patient Goals/Self-Care Activities: Patient will self administer medications as prescribed as evidenced by self report/primary caregiver report  Patient will attend all scheduled provider  appointments as evidenced by clinician review of documented attendance to scheduled appointments and patient/caregiver report Patient will call pharmacy for medication refills as evidenced by patient report and review of pharmacy fill history as appropriate Patient will continue to perform ADL's independently as evidenced by patient/caregiver report Patient will continue to perform IADL's independently as evidenced by patient/caregiver report Patient will call provider office for new concerns or questions as evidenced by review of documented incoming telephone call notes and patient report Patient will work with BSW to address care coordination needs and will continue to work with the clinical team to address health care and disease management related needs as evidenced by documented adherence to scheduled care management/care coordination appointments - check blood sugar at prescribed times: twice daily - enter blood sugar readings and medication or insulin into daily log - take the blood sugar log to all doctor visits - check blood pressure weekly - write blood pressure results in a log or diary - keep a blood pressure log - take blood pressure log to all doctor appointments - call doctor for signs and symptoms of high blood pressure - keep all doctor appointments - take medications for blood pressure exactly as prescribed

## 2022-08-15 NOTE — Patient Outreach (Signed)
Medicaid Managed Care   Nurse Care Manager Note  08/15/2022 Name:  Gerald Olson MRN:  [id number] DOB:  Jul 10, 1966  Gerald Olson is an 56 y.o. year old male who is a primary patient of Thedore Mins, Ria Comment, [place].  The [facility] Managed Care Coordination team was consulted for assistance with:    Chronic healthcare management needs, HTN, DM, chronic pain, anxiety/depression/ADHD , h/o leukemia  Mr. Masur was given information about Medicaid Managed Care Coordination team services today. Gerald Olson Patient agreed to services and verbal consent obtained.  Engaged with patient by telephone for follow up visit in response to provider referral for case management and/or care coordination services.   Assessments/Interventions:  Review of past medical history, allergies, medications, health status, including review of consultants reports, laboratory and other test data, was performed as part of comprehensive evaluation and provision of chronic care management services.  SDOH (Social Determinants of Health) assessments and interventions performed: SDOH Interventions    Flowsheet Row Patient Outreach Telephone from 08/15/2022 in [place] Patient Outreach Telephone from 07/13/2022 in [place] Patient Outreach Telephone from 06/13/2022 in [place] Coordination Patient Outreach Telephone from 03/28/2022 in [place] [employer] Office Visit from 09/20/2021 in [place] Patient Outreach Telephone from 07/03/2021 in [place] Interventions        Food Insecurity Interventions -- -- Intervention Not Indicated -- -- --  Transportation Interventions Intervention Not Indicated -- -- Intervention Not Indicated -- --  Utilities Interventions -- -- Intervention Not Indicated -- -- --  Alcohol Usage Interventions -- Alohol  Education/Brief Advice  [providers have counseled patient regarding alcohol use] -- -- -- --  Depression Interventions/Treatment  -- -- -- -- Counseling --  Physical Activity Interventions Intervention Not Indicated  [unable to engage in strenuous to moderate exercise] -- -- -- -- --  Stress Interventions -- -- -- -- -- Provide Counseling, [place]  No Known Allergies  Medications Reviewed Today     Reviewed by [name], RN (Registered Nurse) on 08/15/22 at 1525  Med List Status: <None>   Medication Order Taking? Sig Documenting Provider Last Dose Status Informant  Blood Pressure Monitoring (BLOOD PRESSURE MONITOR AUTOMAT) DEVI [id number] No Check blood pressure in AM and PM daily Drubel, Ria Comment, PA-C Taking Active   dapagliflozin propanediol (FARXIGA) 5 MG TABS tablet [id number]  Take 1 tablet (5 mg total) by mouth daily before breakfast. [name], MD  Active   diclofenac (VOLTAREN) 50 MG EC tablet [id number]  Take 1 tablet (50 mg total) by mouth 2 (two) times daily. Jennye Boroughs, MD  Active   Patient not taking:  Discontinued 08/09/20 1309 (Patient Preference)   glucose blood (ACCU-CHEK GUIDE) test strip [id number] No USE AS DIRECTED 2 TIMES DAILY [name], MD Taking Active   imatinib (GLEEVEC) 400 MG tablet [id number] No Take 1 tablet (400 mg total) by mouth daily. Lloyd Huger, MD Taking Active   insulin glargine (LANTUS SOLOSTAR) 100 UNIT/ML Solostar Pen [id number] No Inject 7 Units into the skin every morning. [name], MD Taking Active   Insulin Pen Needle (UNIFINE PENTIPS) 32G X 4 MM MISC [id number] No Use to inject Victoza once a day [name], MD Taking Active   lisinopril (ZESTRIL) 10 MG tablet [id number] No Take 1 tablet by mouth daily.  Taking Active   pregabalin (  LYRICA) 100 MG capsule [id number]  Take 1 capsule (100 mg total) by mouth 2 (two) times daily. 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 08/09/2020   Mesenteric lymphadenopathy 06/14/2020   Splenomegaly- mild  06/14/2020   History of cannabis abuse 06/14/2020   Recurrent major depressive disorder, in partial remission ([place]) 06/09/2020   Anxiety 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 06/09/2020   Alcohol use disorder, moderate, dependence ([place]) 06/09/2020   Frequent falls 05/23/2020   Foraminal stenosis of cervical region 05/09/2020   Diabetic polyneuropathy    ADHD (attention deficit hyperactivity disorder)    Generalized anxiety disorder    Major depressive disorder    Cervical spondylosis 09/22/2019   Essential hypertension, benign 37/16/9678   Alcoholic cirrhosis of liver 06/02/2018   Type 2 diabetes mellitus with hyperglycemia 04/25/2018   Conditions to be addressed/monitored per PCP order:  Chronic healthcare management needs, HTN, DM, chronic pain, anxiety/depression/ADHD, h/o leukemia  Care Plan : RNCM Plan of Care  Updates made by [name], RN since 08/15/2022 12:00 AM     Problem: Chronic Disease Management and Care Coordination Needs for DM, HTN, CML and Back Pain      Long-Range Goal: Plan of Care for Chronic Disease management and Care Coordination Needs (HTN, DM, CML, Chronic Back Pain)   Start Date: 05/09/2021  Expected End Date: 11/14/2022  Priority: High  Note:   Current Barriers:  Knowledge Deficits related to plan of care for management of HTN, DMII, and CML and Chronic Back Pain  Chronic Disease Management support and education needs  related to HTN, DMII, and CML and Chronic Back Pain 08/15/22:  Patient with SDOH concerns-needs resources-referral placed.  Recent appointments with ENDO, pain management-has f/u appts scheduled.  Continues group therapy once a month.  Not checking blood sugars.  BP WNL per patient.  RNCM Clinical Goal(s):  Patient will verbalize understanding of plan for management of HTN, DMII, and CML and Chronic Back Pain verbalize basic understanding of HTN, DMII, and CML and Chronic Back Pain disease process and self health management plan  take all medications exactly as prescribed and will call provider for medication related questions attend all scheduled medical appointments demonstrate ongoing adherence to prescribed treatment plan for HTN, DMII, and CML and Chronic Back Pain as evidenced by daily monitoring and recording of CBG, adherence to ADA/ carb modified diet, adherence to prescribed medication regimen, contacting provider for new or worsened symptoms or questions  demonstrate ongoing health management independence  continue to work with RN Care Manager to address care management and care coordination needs related to HTN, DMII, and CML and Chronic Back Pain  work with pharmacist to address complex medication regimen  related to HTN, DMII, and CML and Chronic Back Pain work with Education officer, museum to address financial constraints related to overdue payment of back taxes and potential risk of losing home, Limited social support and counseling for anxiety/stress related to the management of HTN, DMII, and CML and Chronic Back Pain. collaborate with the  care management team towards completion of advanced directives  through collaboration with RN Care manager, provider, and care team.   Interventions: Inter-disciplinary care team collaboration (see longitudinal plan of care) Evaluation of current treatment plan related to  self management and patient's adherence to plan as established by  provider Collaboration with PCP office for Tramadol refill at patient request. BSW referral for resources Collaborated with BSW  Diabetes:  (Status: Goal on Track (progressing): YES.) Lab Results  Component Value Date   HGBA1C 6.1 (A) 01/16/2022                                                                            6.6                                    04/04/22                                                                           7.3                                     08/09/22 Assessed patient's understanding of A1c goal: <6.5% Continue to provided education to patient about basic DM disease process; Reviewed medications with patient and discussed importance of medication adherence. Counseled on importance of regular laboratory monitoring as prescribed;        Discussed plans with patient for ongoing care management follow up and provided patient with direct contact information for care management team;      Reviewed scheduled/upcoming provider appointments including: See RNCM Clinical Goals above.       Call provider for findings outside established parameters.      Referral made to pharmacy team previously for assistance with complex medication regimen.  Patient doing well with medication compliance and his understanding of all medications.  Pharmacist referral not needed at this time.       Review of patient status, including review of consultants reports, relevant laboratory and other test results, and medications completed.  Patient understands the importance of drinking low sugar drinks and use of sugar substitutes.       Chronic Myeloid Leukemia (CML)  (Status: Goal on Track (progressing): YES.) Evaluation of current treatment plan related to  CML and patient's adherence to plan as established by provider.  Patient continues to be in remission and the plan is for patient to continue on Gleevec 400 mg daily.  Patient continues to take the [place] with some protein to avoid having  diarrhea.  Discussed plans with patient for ongoing care management follow up and provided patient with direct contact information for care management team Reviewed medications with patient and discussed the importance of taking all medications as prescribed; Pharmacy referral for complex medication regimen made previously.  [facility] Pharmacist  not needed at this time. 07/13/22: ONC appt 07/04/22-WNL  Hypertension: (Status: Goal on Track (progressing): YES.) Last practice recorded Blood Pressure readings: BP Readings from Last 3 Encounters:  01/16/22 115/80  12/26/21 112/76  09/22/21 (!) 144/90   06/13/22:  BP 145/87   07/30/22:   BP 120/78 Most recent eGFR/CrCl: No results found for: EGFR  No components found for: CRCL  Evaluation of current treatment plan related to hypertension self management and patient's adherence to plan as established by provider;   Reviewed medications with patient and discussed importance of compliance.  Discussed plans with patient for ongoing care management follow up and provided patient with direct contact information for care management team; Discussed patient's recent blood pressure readings at recent medical office visits.  Reviewed scheduled/upcoming provider appointments including: See RNCM Clinical Goals above. Patient did obtain a blood pressure monitor through [employer].   Continued to re-enforce education on Orthostatic blood pressure changes that occur normally when changing positions from lying to sitting to standing.  Patient continues to report he does experience some lightheaded with moving from a sitting to a standing position which only last a few seconds.  Patient checks his blood pressure and heart rate at time of episode. Patient suspects caffeine may be making heart rate elevated. Instructed patient to change positions slowly and continue to monitor. Patient verbalized understanding.  Pain:  (Status: Goal on Track (progressing): YES.) Pain  assessment performed.  Patient reports chronic neck & back pain. Pain rating is on a Pain Scale of 0 - 10. Currently using Ibuprofen 600 mg PRN during the day and Tramadol at night with fair relief - able to sleep only 2 hours at a stretch.  Pain interferes with sleep due to staying in same position for 2 hours.  Per patient, his pain is either triggered by staying in same position for a prolonged period of time or by moving too much. Medications reviewed.  Reviewed provider established plan for pain management; Discussed importance of adherence to all scheduled medical appointments; Patient continues to report having Neuropathy on his left side of his body (arm and leg) causing numbness and lack of coordination with movement.    Will continue to monitor.    Pain better with Qutenza patch  Patient Goals/Self-Care Activities: Patient will self administer medications as prescribed as evidenced by self report/primary caregiver report  Patient will attend all scheduled provider appointments as evidenced by clinician review of documented attendance to scheduled appointments and patient/caregiver report Patient will call pharmacy for medication refills as evidenced by patient report and review of pharmacy fill history as appropriate Patient will continue to perform ADL's independently as evidenced by patient/caregiver report Patient will continue to perform IADL's independently as evidenced by patient/caregiver report Patient will call provider office for new concerns or questions as evidenced by review of documented incoming telephone call notes and patient report Patient will work with BSW to address care coordination needs and will continue to work with the clinical team to address health care and disease management related needs as evidenced by documented adherence to scheduled care management/care coordination appointments - check blood sugar at prescribed times: twice daily - enter blood sugar  readings and medication or insulin into daily log - take the blood sugar log to all doctor visits - check blood pressure weekly - write blood pressure results in a log or diary - keep a blood pressure log - take blood pressure log to all doctor appointments - call doctor for  signs and symptoms of high blood pressure - keep all doctor appointments - take medications for blood pressure exactly as prescribed   Follow Up:  Patient agrees to Care Plan and Follow-up.  Plan: The Managed Medicaid care management team will reach out to the patient again over the next 30 business  days. and The  Patient has been provided with contact information for the Managed Medicaid care management team and has been advised to call with any health related questions or concerns.  Date/time of next scheduled RN care management/care coordination outreach: 09/19/22 at 315.

## 2022-08-16 ENCOUNTER — Telehealth: Payer: Self-pay

## 2022-08-16 ENCOUNTER — Other Ambulatory Visit: Payer: Self-pay

## 2022-08-16 ENCOUNTER — Other Ambulatory Visit (HOSPITAL_COMMUNITY): Payer: Self-pay

## 2022-08-16 NOTE — Telephone Encounter (Signed)
Pharmacy Patient Advocate Encounter  Prior Authorization for FARXIGA 5 MG TAB has been approved.    PA# [id number] Effective dates: 08/16/22 through 08/16/23  Spoke with Pharmacy to process.  Received notification from [place] that prior authorization for FARXIGA 5 MG TAB is needed.    PA submitted on 08/16/22 [place], [place] Patient Advocate Specialist Direct Number: [phone] Fax: [phone]

## 2022-08-21 ENCOUNTER — Ambulatory Visit: Payer: Medicaid Other

## 2022-08-29 ENCOUNTER — Telehealth: Payer: Self-pay

## 2022-08-29 NOTE — Telephone Encounter (Signed)
Valid Approval for Diclofenac Sod EC 50 mg tab valid 08/09/22-08/09/23

## 2022-08-31 ENCOUNTER — Encounter: Payer: Medicaid Other | Admitting: Physician Assistant

## 2022-09-03 ENCOUNTER — Other Ambulatory Visit (HOSPITAL_COMMUNITY): Payer: Self-pay

## 2022-09-04 ENCOUNTER — Other Ambulatory Visit: Payer: Self-pay

## 2022-09-12 ENCOUNTER — Other Ambulatory Visit (HOSPITAL_COMMUNITY): Payer: Self-pay

## 2022-09-19 ENCOUNTER — Ambulatory Visit: Payer: Medicaid Other | Admitting: Obstetrics and Gynecology

## 2022-09-19 ENCOUNTER — Other Ambulatory Visit (HOSPITAL_COMMUNITY): Payer: Self-pay

## 2022-09-19 ENCOUNTER — Encounter: Payer: Self-pay | Admitting: Obstetrics and Gynecology

## 2022-09-19 ENCOUNTER — Ambulatory Visit: Payer: Medicaid Other

## 2022-09-19 ENCOUNTER — Other Ambulatory Visit: Payer: Medicaid Other

## 2022-09-19 ENCOUNTER — Other Ambulatory Visit: Payer: Medicaid Other | Admitting: Obstetrics and Gynecology

## 2022-09-19 NOTE — Patient Outreach (Signed)
Medicaid Managed Care Social Work Note  09/19/2022 Name:  Gerald Olson MRN:  [id number] DOB:  October 03, 1965  Gerald Olson is an 57 y.o. year old male who is a primary patient of Thedore Mins, Ria Comment, [place].  The [facility] Managed Care Coordination team was consulted for assistance with:  Community Resources   [name] was given information about Medicaid Managed Care Coordination team services today. Gerald Olson Patient agreed to services and verbal consent obtained.  Engaged with patient  for by telephone forfollow up visit in response to referral for case management and/or care coordination services.   Assessments/Interventions:  Review of past medical history, allergies, medications, health status, including review of consultants reports, laboratory and other test data, was performed as part of comprehensive evaluation and provision of chronic care management services.  SDOH: (Social Determinant of Health) assessments and interventions performed: SDOH Interventions    Flowsheet Row Patient Outreach Telephone from 08/15/2022 in [place] Patient Outreach Telephone from 07/13/2022 in [place] Patient Outreach Telephone from 06/13/2022 in [place] Coordination Patient Outreach Telephone from 03/28/2022 in [place] Office Visit from 09/20/2021 in [place] Patient Outreach Telephone from 07/03/2021 in [place] Coordination  SDOH Interventions        Food Insecurity Interventions -- -- Intervention Not Indicated -- -- --  Transportation Interventions Intervention Not Indicated -- -- Intervention Not Indicated -- --  Utilities Interventions -- -- Intervention Not Indicated -- -- --  Alcohol Usage Interventions -- Alohol Education/Brief Advice  [providers have counseled patient regarding alcohol use] -- -- -- --   Depression Interventions/Treatment  -- -- -- -- Counseling --  Physical Activity Interventions Intervention Not Indicated  [unable to engage in strenuous to moderate exercise] -- -- -- -- --  Stress Interventions -- -- -- -- -- Provide Counseling, [place] completed telephone outreach with patient, he stated he did not re certify for his foodstamps he is familiar with the food pantries in the area, he stated he could just go to the store and purchase food because the grocery store is closer. Patient states his basement caved in and he is trying to to repair it himself. Patient states he has contacted the housing coalition but they will not help him because his home is under foreclosure BSW provided patient with information with food pantries and healthy blue.   Advanced Directives Status:  Not addressed in this encounter.  Care Plan                 No Known Allergies  Medications Reviewed Today     Reviewed by [name], RN (Registered Nurse) on 08/15/22 at 1525  Med List Status: <None>   Medication Order Taking? Sig Documenting Provider Last Dose Status Informant  Blood Pressure Monitoring (BLOOD PRESSURE MONITOR AUTOMAT) DEVI [id number] No Check blood pressure in AM and PM daily Drubel, Ria Comment, PA-C Taking Active   dapagliflozin propanediol (FARXIGA) 5 MG TABS tablet [id number]  Take 1 tablet (5 mg total) by mouth daily before breakfast. [name], MD  Active   diclofenac (VOLTAREN) 50 MG EC tablet [id number]  Take 1 tablet (50 mg total) by mouth 2 (two) times daily. Jennye Boroughs, MD  Active   Patient not taking:  Discontinued 08/09/20 1309 (Patient Preference)   glucose blood (ACCU-CHEK GUIDE) test strip [id number] No USE AS  DIRECTED 2 TIMES DAILY [name], MD Taking Active   imatinib (GLEEVEC) 400 MG tablet [id number] No Take 1 tablet (400 mg total) by mouth daily. Lloyd Huger, MD Taking Active   insulin glargine (LANTUS  SOLOSTAR) 100 UNIT/ML Solostar Pen [id number] No Inject 7 Units into the skin every morning. [name], MD Taking Active   Insulin Pen Needle (UNIFINE PENTIPS) 32G X 4 MM MISC [id number] No Use to inject Victoza once a day [name], MD Taking Active   lisinopril (ZESTRIL) 10 MG tablet [id number] No Take 1 tablet by mouth daily.  Taking Active   pregabalin (LYRICA) 100 MG capsule [id number]  Take 1 capsule (100 mg total) by mouth 2 (two) times daily. 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 08/09/2020   Mesenteric lymphadenopathy 06/14/2020   Splenomegaly- mild  06/14/2020   History of cannabis abuse 06/14/2020   Recurrent major depressive disorder, in partial remission ([place]) 06/09/2020   Anxiety 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 06/09/2020   Alcohol use disorder, moderate, dependence ([place]) 06/09/2020   Frequent falls 05/23/2020   Foraminal stenosis of cervical region 05/09/2020   Diabetic polyneuropathy    ADHD (attention deficit hyperactivity disorder)    Generalized anxiety disorder    Major depressive disorder    Cervical spondylosis 09/22/2019   Essential hypertension, benign 58/52/7782   Alcoholic cirrhosis of liver 06/02/2018   Type 2 diabetes mellitus with hyperglycemia 04/25/2018    Conditions to be addressed/monitored per PCP order:   community resources  Care Plan : General Social Work (Adult)  Updates made by Ethelda Chick since 09/19/2022 12:00 AM     Problem: Anxiety Identification (Anxiety)       Long-Range Goal: Anxiety Symptoms Identified   Start Date: 05/23/2021  Priority: High  Note:   Timeframe:  Long-Range Goal Priority:  High Start Date:  05/23/21                            Expected End Date: 07/03/21  Current barriers:   Chronic Mental Health needs related to anxiety, grief and depression Mental Health Concerns  and Social Isolation Needs Support, Education, and Care Coordination in order to meet unmet mental health needs. Clinical Goal(s): demonstrate a reduction in symptoms related to :Anxiety , Depression, Grief, connect with provider for ongoing mental health treatment.  , and increase coping skills, healthy habits, self-management skills, and stress reduction     Clinical Interventions:  Assessed patient's previous and current treatment, coping skills, support system and barriers to care  Depression screen reviewed  PHQ2/ PHQ9 completed Provided brief CBT  Reviewed mental health medications with patient and discussed compliance:  Quality of sleep assessed & Sleep Hygiene techniques promoted  Suicidal Ideation/Homicidal Ideation assessed: [place]  ; Review various resources, discussed options and provided patient information about  Options for mental health treatment based on need and insurance Inter-disciplinary care team collaboration (see longitudinal plan of care) Patient  prefers CBT therapy and ONLY counseling instead of medication management. [facility] LCSW has been to [facility] and [employer] and states that he had a bill from them when he thought Medicaid should have covered his services. [facility] LCSW made referral [facility] and he has agreed to contact them to make a self referral as well. UPDATE- Patient has not contacted [facility] yet and prefers to not pursue mental health treatment at this time as he wishes to focus on physical health and only wants to see a psychologist at this time which is not covered  by his Medicaid plan. [facility] LCSW has provided list of mental health resources in case he changes his mind. [facility] LCSW will close case at this time.  BSW completed telephone outreach with patient, he stated he did not re certify for his foodstamps he is familiar with the food pantries in the area, he stated he could just go to the store and purchase food because the grocery store is closer. Patient states his basement caved in and he is trying to to repair it himself. Patient states he has contacted the housing coalition but they will not help him because his home is under foreclosure BSW provided patient with information with food pantries and healthy blue.  Patient Goals/Self-Care Activities: Over the next 120 days Call your insurance provider for more information about your Enhanced Benefits        Follow up:  Patient agrees to Care Plan and Follow-up.  Plan: The Managed Medicaid care management team will reach out to the patient again over the next 30 days.  Date/time of next scheduled Social Work care management/care coordination outreach:  10/22/22  [name], Arita Miss, [place] Medicaid Team  [phone]

## 2022-09-19 NOTE — Patient Instructions (Signed)
Hi [name], I am glad you got your water fixed.  Please reach out for any additional resources needed.  [name] was given information about Medicaid Managed Care team care coordination services as a part of their Healthy [facility] Medicaid benefit. Gerald Olson verbally consented to engagement with the [facility] Managed Care team.   If you are experiencing a medical emergency, please call 911 or report to your local emergency department or urgent care.   If you have a non-emergency medical problem during routine business hours, please contact your provider's office and ask to speak with a nurse.   For questions related to your Healthy [facility] health plan, please call: [phone] or visit the homepage here: [email or web address]  If you would like to schedule transportation through your Healthy [facility] plan, please call the following number at least 2 days in advance of your appointment: [phone]  For information about your ride after you set it up, call Ride Assist at [phone]. Use this number to activate a Will Call pickup, or if your transportation is late for a scheduled pickup. Use this number, too, if you need to make a change or cancel a previously scheduled reservation.  If you need transportation services right away, call [phone]. The after-hours call center is staffed 24 hours to handle ride assistance and urgent reservation requests (including discharges) 365 days a year. Urgent trips include sick visits, hospital discharge requests and life-sustaining treatment.  Call the [place] at [phone], at any time, 24 hours a day, 7 days a week. If you are in danger or need immediate medical attention call 911.  If you would like help to quit smoking, call 1-800-QUIT-NOW 315-344-9337) OR Espaol: 1-855-Djelo-Ya (5-003-704-8889) o para ms informacin haga clic aqu or Text READY to 200-400 to register via  text  [name] - following are the goals we discussed in your visit today:   Goals Addressed             This Visit's Progress    RNCM - Chronic Back Pain Management       Timeframe:  Long-Range Goal Priority:  High Start Date:   05/09/2021                          Expected End Date: ongoing  Patient Goals: Patient will self administer medications as prescribed Patient will attend all scheduled provider appointments Patient will call pharmacy for medication refills Patient will continue to perform ADL's independently Patient will continue to perform IADL's independently Patient will call provider office for new concerns or questions Patient will work with BSW to address care coordination needs and will continue to work with the clinical team to address health care and disease management related needs.   Patient's pain will be managed to a tolerable pain level where patient can be functional with ADL's and IADL's.    09/19/22:  patient has all meds and taking all meds     RNCM - CML Disease Progression Minimized or Managed       Timeframe:  Long-Range Goal Priority:  High Start Date:     05/09/2021                       Expected End Date: ongoing  Patient Goals: Patient will self administer medications as prescribed Patient will attend all scheduled provider appointments Patient will call pharmacy for medication refills Patient will continue to perform ADL's  independently Patient will continue to perform IADL's independently Patient will call provider office for new concerns or questions Patient will work with BSW to address care coordination needs and will continue to work with the clinical team to address health care and disease management related needs.  09/19/22:  Followed by ONC-in remission                      RNCM - Glycemic Management & Monitored       Timeframe:  Long-Range Goal Priority:  High Start Date:     05/09/2021                        Expected End Date:   ongoing  Patient Goals: Patient will self administer medications as prescribed Patient will attend all scheduled provider appointments Patient will call pharmacy for medication refills Patient will continue to perform ADL's independently Patient will continue to perform IADL's independently Patient will call provider office for new concerns or questions Patient will work with BSW to address care coordination needs and will continue to work with the clinical team to address health care and disease management related needs.      Patient monitor blood sugars twice a day at varies times using Accu-check guide and record results. A1C level will remain at 6.0 or below.    09/19/22:  patient does not check blood sugars-appt with PCP 09/21/22.     RNCM - Hypertension Monitored and Managed       Timeframe:  Long-Range Goal Priority:  High Start Date:  05/09/2021                           Expected End Date:  ongoing  Patient Goals Patient will self administer medications as prescribed Patient will attend all scheduled provider appointments Patient will call pharmacy for medication refills Patient will continue to perform ADL's independently Patient will continue to perform IADL's independently Patient will call provider office for new concerns or questions Patient will work with BSW to address care coordination needs and will continue to work with the clinical team to address health care and disease management related needs.    Patient will check blood pressure with new blood pressure monitor at least once a week and record readings.          09/19/22:  patient does not check BP, has PCP appt 1/26   Patient verbalizes understanding of instructions and care plan provided today and agrees to view in [place]. Active MyChart status and patient understanding of how to access instructions and care plan via MyChart confirmed with patient.     The Managed Medicaid care management team will reach out to  the patient again over the next 30 business  days.  The  Patient  has been provided with contact information for the Managed Medicaid care management team and has been advised to call with any health related questions or concerns.   Gerald Raider RN, BSN [place] Management Coordinator - Managed Medicaid High Risk [phone]  Following is a copy of your plan of care:  Care Plan : RNCM Plan of Care  Updates made by Gerald Medicus, RN since 09/19/2022 12:00 AM     Problem: Chronic Disease Management and Care Coordination Needs for DM, HTN, CML and Back Pain      Long-Range Goal: Plan of Care for Chronic Disease management  and Care Coordination Needs (HTN, DM, CML, Chronic Back Pain)   Start Date: 05/09/2021  Expected End Date: 11/14/2022  Priority: High  Note:   Current Barriers:  Knowledge Deficits related to plan of care for management of HTN, DMII, and CML and Chronic Back Pain  Chronic Disease Management support and education needs related to HTN, DMII, and CML and Chronic Back Pain 09/19/22:  Patient does not check BP or BG.  Patient spoke to [facility] today who provided resources for SDOH concerns.  Patient has appt with PCP 1/26.   RNCM Clinical Goal(s):  Patient will verbalize understanding of plan for management of HTN, DMII, and CML and Chronic Back Pain verbalize basic understanding of HTN, DMII, and CML and Chronic Back Pain disease process and self health management plan  take all medications exactly as prescribed and will call provider for medication related questions attend all scheduled medical appointments demonstrate ongoing adherence to prescribed treatment plan for HTN, DMII, and CML and Chronic Back Pain as evidenced by daily monitoring and recording of CBG, adherence to ADA/ carb modified diet, adherence to prescribed medication regimen, contacting provider for new or worsened symptoms or questions  demonstrate ongoing health management  independence  continue to work with RN Care Manager to address care management and care coordination needs related to HTN, DMII, and CML and Chronic Back Pain  work with pharmacist to address complex medication regimen  related to HTN, DMII, and CML and Chronic Back Pain work with Education officer, museum to address financial constraints related to overdue payment of back taxes and potential risk of losing home, Limited social support and counseling for anxiety/stress related to the management of HTN, DMII, and CML and Chronic Back Pain. collaborate with the care management team towards completion of advanced directives  through collaboration with RN Care manager, provider, and care team.   Interventions: Inter-disciplinary care team collaboration (see longitudinal plan of care) Evaluation of current treatment plan related to  self management and patient's adherence to plan as established by provider Collaboration with PCP office for Tramadol refill at patient request. BSW referral for resources-completed. Collaborated with BSW  Diabetes:  (Status: Goal on Track (progressing): YES.) Lab Results  Component Value Date   HGBA1C 6.1 (A) 01/16/2022                                                                            6.6                                    04/04/22                                                                           7.3  08/09/22 Assessed patient's understanding of A1c goal: <6.5% Continue to provided education to patient about basic DM disease process; Reviewed medications with patient and discussed importance of medication adherence. Counseled on importance of regular laboratory monitoring as prescribed;        Discussed plans with patient for ongoing care management follow up and provided patient with direct contact information for care management team;      Reviewed scheduled/upcoming provider appointments including: See RNCM Clinical Goals  above.       Call provider for findings outside established parameters.      Referral made to pharmacy team previously for assistance with complex medication regimen.  Patient doing well with medication compliance and his understanding of all medications.  Pharmacist referral not needed at this time.       Review of patient status, including review of consultants reports, relevant laboratory and other test results, and medications completed.  Patient understands the importance of drinking low sugar drinks and use of sugar substitutes.       Chronic Myeloid Leukemia (CML)  (Status: Goal on Track (progressing): YES.) Evaluation of current treatment plan related to  CML and patient's adherence to plan as established by provider.  Patient continues to be in remission and the plan is for patient to continue on Gleevec 400 mg daily.  Patient continues to take the [place] with some protein to avoid having diarrhea.  Discussed plans with patient for ongoing care management follow up and provided patient with direct contact information for care management team Reviewed medications with patient and discussed the importance of taking all medications as prescribed; Pharmacy referral for complex medication regimen made previously.  [facility] Pharmacist not needed at this time. 07/13/22: ONC appt 07/04/22-WNL  Hypertension: (Status: Goal on Track (progressing): YES.) Last practice recorded Blood Pressure readings: BP Readings from Last 3 Encounters:  01/16/22 115/80  12/26/21 112/76  09/22/21 (!) 144/90   06/13/22:  BP 145/87   07/30/22:   BP 120/78 Most recent eGFR/CrCl: No results found for: EGFR  No components found for: CRCL  Evaluation of current treatment plan related to hypertension self management and patient's adherence to plan as established by provider;   Reviewed medications with patient and discussed importance of compliance.  Discussed plans with patient for ongoing care management follow up and  provided patient with direct contact information for care management team; Discussed patient's recent blood pressure readings at recent medical office visits.  Reviewed scheduled/upcoming provider appointments i Patient did obtain a blood pressure monitor through [facility].   Continued to re-enforce education on Orthostatic blood pressure changes that occur normally when changing positions from lying to sitting to standing.  Patient continues to report he does experience some lightheaded with moving from a sitting to a standing position which only last a few seconds.  Patient checks his blood pressure and heart rate at time of episode. Patient suspects caffeine may be making heart rate elevated. Instructed patient to change positions slowly and continue to monitor. Patient verbalized understanding.  Pain:  (Status: Goal on Track (progressing): YES.) Pain assessment performed.  Patient reports chronic neck & back pain. Pain rating is on a Pain Scale of 0 - 10. Currently using Ibuprofen 600 mg PRN during the day and Tramadol at night with fair relief - able to sleep only 2 hours at a stretch.  Pain interferes with sleep due to staying in same position for 2 hours.  Per patient, his pain is either triggered by staying in  same position for a prolonged period of time or by moving too much. Medications reviewed.  Reviewed provider established plan for pain management; Discussed importance of adherence to all scheduled medical appointments; Patient continues to report having Neuropathy on his left side of his body (arm and leg) causing numbness and lack of coordination with movement.    Will continue to monitor.    Pain better with Qutenza patch  Patient Goals/Self-Care Activities: Patient will self administer medications as prescribed as evidenced by self report/primary caregiver report  Patient will attend all scheduled provider appointments as evidenced by clinician review of documented attendance  to scheduled appointments and patient/caregiver report Patient will call pharmacy for medication refills as evidenced by patient report and review of pharmacy fill history as appropriate Patient will continue to perform ADL's independently as evidenced by patient/caregiver report Patient will continue to perform IADL's independently as evidenced by patient/caregiver report Patient will call provider office for new concerns or questions as evidenced by review of documented incoming telephone call notes and patient report Patient will work with BSW to address care coordination needs and will continue to work with the clinical team to address health care and disease management related needs as evidenced by documented adherence to scheduled care management/care coordination appointments - check blood sugar at prescribed times: twice daily - enter blood sugar readings and medication or insulin into daily log - take the blood sugar log to all doctor visits - check blood pressure weekly - write blood pressure results in a log or diary - keep a blood pressure log - take blood pressure log to all doctor appointments - call doctor for signs and symptoms of high blood pressure - keep all doctor appointments - take medications for blood pressure exactly as prescribed

## 2022-09-19 NOTE — Patient Instructions (Signed)
Visit Information  Mr. Pauley was given information about Medicaid Managed Care team care coordination services as a part of their Healthy [facility] Medicaid benefit. [name] verbally consented to engagement with the [facility] Managed Care team.   If you are experiencing a medical emergency, please call 911 or report to your local emergency department or urgent care.   If you have a non-emergency medical problem during routine business hours, please contact your provider's office and ask to speak with a nurse.   For questions related to your Healthy [facility] health plan, please call: [phone] or visit the homepage here: [email or web address]  If you would like to schedule transportation through your Healthy [facility] plan, please call the following number at least 2 days in advance of your appointment: [phone]  For information about your ride after you set it up, call Ride Assist at [phone]. Use this number to activate a Will Call pickup, or if your transportation is late for a scheduled pickup. Use this number, too, if you need to make a change or cancel a previously scheduled reservation.  If you need transportation services right away, call [phone]. The after-hours call center is staffed 24 hours to handle ride assistance and urgent reservation requests (including discharges) 365 days a year. Urgent trips include sick visits, hospital discharge requests and life-sustaining treatment.  Call the [place] at [phone], at any time, 24 hours a day, 7 days a week. If you are in danger or need immediate medical attention call 911.  If you would like help to quit smoking, call 1-800-QUIT-NOW 210-305-6008) OR Espaol: 1-855-Djelo-Ya (5-093-267-1245) o para ms informacin haga clic aqu or Text READY to 200-400 to register via text  Mr. Harmes - following are the goals we discussed in your visit today:   Goals  Addressed   None      Social Worker will follow up on 10/22/22 .   [name], BSW, [place]  High Risk Managed Medicaid Team  [phone]   Following is a copy of your plan of care:  Care Plan : General Social Work (Adult)  Updates made by Ethelda Chick since 09/19/2022 12:00 AM     Problem: Anxiety Identification (Anxiety)      Long-Range Goal: Anxiety Symptoms Identified   Start Date: 05/23/2021  Priority: High  Note:   Timeframe:  Long-Range Goal Priority:  High Start Date:  05/23/21                            Expected End Date: 07/03/21  Current barriers:   Chronic Mental Health needs related to anxiety, grief and depression Mental Health Concerns  and Social Isolation Needs Support, Education, and Care Coordination in order to meet unmet mental health needs. Clinical Goal(s): demonstrate a reduction in symptoms related to :Anxiety , Depression, Grief, connect with provider for ongoing mental health treatment.  , and increase coping skills, healthy habits, self-management skills, and stress reduction     Clinical Interventions:  Assessed patient's previous and current treatment, coping skills, support system and barriers to care  Depression screen reviewed  PHQ2/ PHQ9 completed Provided brief CBT  Reviewed mental health medications with patient and discussed compliance:  Quality of sleep assessed & Sleep Hygiene techniques promoted  Suicidal Ideation/Homicidal Ideation assessed: [place]  ; Review various resources, discussed options and provided patient information about  Options for  mental health treatment based on need and insurance Inter-disciplinary care team collaboration (see longitudinal plan of care) Patient prefers CBT therapy and ONLY counseling instead of medication management. [facility] LCSW has been to [facility] and [facility] and states that he had a bill from them when he thought Medicaid should  have covered his services. [facility] LCSW made referral [facility] and he has agreed to contact them to make a self referral as well. UPDATE- Patient has not contacted [facility] yet and prefers to not pursue mental health treatment at this time as he wishes to focus on physical health and only wants to see a psychologist at this time which is not covered by his Medicaid plan. [facility] LCSW has provided list of mental health resources in case he changes his mind. [facility] LCSW will close case at this time.  BSW completed telephone outreach with patient, he stated he did not re certify for his foodstamps he is familiar with the food pantries in the area, he stated he could just go to the store and purchase food because the grocery store is closer. Patient states his basement caved in and he is trying to to repair it himself. Patient states he has contacted the housing coalition but they will not help him because his home is under foreclosure BSW provided patient with information with food pantries and healthy blue.  Patient Goals/Self-Care Activities: Over the next 120 days Call your insurance provider for more information about your Enhanced Benefits

## 2022-09-19 NOTE — Patient Outreach (Signed)
Medicaid Managed Care   Nurse Care Manager Note  09/19/2022 Name:  Gerald Olson MRN:  [id number] DOB:  1966-05-30  Gerald Olson is an 57 y.o. year old male who is a primary patient of Thedore Mins, Ria Comment, [place].  The [facility] Managed Care Coordination team was consulted for assistance with:    Chronic healthcare management needs, HTN, DM, chronic pain, anxiety/depression/ADHD, h/o leukemia, SDOH  Gerald Olson was given information about Medicaid Managed Care Coordination team services today. Gerald Olson Patient agreed to services and verbal consent obtained.  Engaged with patient by telephone for follow up visit in response to provider referral for case management and/or care coordination services.   Assessments/Interventions:  Review of past medical history, allergies, medications, health status, including review of consultants reports, laboratory and other test data, was performed as part of comprehensive evaluation and provision of chronic care management services.  SDOH (Social Determinants of Health) assessments and interventions performed: SDOH Interventions    Flowsheet Row Patient Outreach Telephone from 09/19/2022 in [place] Patient Outreach Telephone from 08/15/2022 in [place] Patient Outreach Telephone from 07/13/2022 in [place] Patient Outreach Telephone from 06/13/2022 in [place] Coordination Patient Outreach Telephone from 03/28/2022 in [place] Office Visit from 09/20/2021 in [place]  SDOH Interventions        Food Insecurity Interventions -- -- -- Intervention Not Indicated -- --  Transportation Interventions -- Intervention Not Indicated -- -- Intervention Not Indicated --  Utilities Interventions -- -- -- Intervention Not Indicated -- --  Alcohol Usage Interventions -- -- Alohol  Education/Brief Advice  [providers have counseled patient regarding alcohol use] -- -- --  Depression Interventions/Treatment  -- -- -- -- -- Counseling  Financial Strain Interventions Other (Comment)  [BSW spoke with patient today and provided resources] -- -- -- -- --  Physical Activity Interventions -- Intervention Not Indicated  [unable to engage in strenuous to moderate exercise] -- -- -- --  Social Connections Interventions Intervention Not Indicated -- -- -- -- --     Care Plan  No Known Allergies  Medications Reviewed Today     Reviewed by [name], RN (Registered Nurse) on 09/19/22 at 1533  Med List Status: <None>   Medication Order Taking? Sig Documenting Provider Last Dose Status Informant  Blood Pressure Monitoring (BLOOD PRESSURE MONITOR AUTOMAT) DEVI [id number] No Check blood pressure in AM and PM daily Drubel, Ria Comment, PA-C Taking Active   dapagliflozin propanediol (FARXIGA) 5 MG TABS tablet [id number]  Take 1 tablet (5 mg total) by mouth daily before breakfast. [name], MD  Active   diclofenac (VOLTAREN) 50 MG EC tablet [id number]  Take 1 tablet (50 mg total) by mouth 2 (two) times daily. Jennye Boroughs, MD  Active   Patient not taking:  Discontinued 08/09/20 1309 (Patient Preference)   glucose blood (ACCU-CHEK GUIDE) test strip [id number] No USE AS DIRECTED 2 TIMES DAILY [name], MD Taking Active   imatinib (GLEEVEC) 400 MG tablet [id number] No Take 1 tablet (400 mg total) by mouth daily. Lloyd Huger, MD Taking Active   insulin glargine (LANTUS SOLOSTAR) 100 UNIT/ML Solostar Pen [id number] No Inject 7 Units into the skin every morning. [name], MD Taking Active   Insulin Pen Needle (UNIFINE PENTIPS) 32G X 4 MM MISC [id number] No Use to inject Victoza once a day [name], MD Taking Active  lisinopril (ZESTRIL) 10 MG tablet [id number] No Take 1 tablet by mouth daily.  Taking Active   pregabalin (LYRICA) 100 MG capsule [id number]   Take 1 capsule (100 mg total) by mouth 2 (two) times daily. 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 08/09/2020   Mesenteric lymphadenopathy 06/14/2020   Splenomegaly- mild  06/14/2020   History of cannabis abuse 06/14/2020   Recurrent major depressive disorder, in partial remission ([place]) 06/09/2020   Anxiety 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 06/09/2020   Alcohol use disorder, moderate, dependence ([place]) 06/09/2020   Frequent falls 05/23/2020   Foraminal stenosis of cervical region 05/09/2020   Diabetic polyneuropathy    ADHD (attention deficit hyperactivity disorder)    Generalized anxiety disorder    Major depressive disorder    Cervical spondylosis 09/22/2019   Essential hypertension, benign 94/80/1655   Alcoholic cirrhosis of liver 06/02/2018   Type 2 diabetes mellitus with hyperglycemia 04/25/2018   Conditions to be addressed/monitored per PCP order:  Chronic healthcare management needs, HTN, DM, chronic pain, anxiety/depression/ADHD, h/o leukemia, SDOH  Care Plan : RNCM Plan of Care  Updates made by [name], RN since 09/19/2022 12:00 AM     Problem: Chronic Disease Management and Care Coordination Needs for DM, HTN, CML and Back Pain      Long-Range Goal: Plan of Care for Chronic Disease management and Care Coordination Needs (HTN, DM, CML, Chronic Back Pain)   Start Date: 05/09/2021  Expected End Date: 11/14/2022  Priority: High  Note:   Current Barriers:  Knowledge Deficits related to plan of care for  management of HTN, DMII, and CML and Chronic Back Pain  Chronic Disease Management support and education needs related to HTN, DMII, and CML and Chronic Back Pain 09/19/22:  Patient does not check BP or BG.  Patient spoke to [facility] today who provided resources for SDOH concerns.  Patient has appt with PCP 1/26.   RNCM Clinical Goal(s):  Patient will verbalize understanding of plan for management of HTN, DMII, and CML and Chronic Back Pain verbalize basic understanding of HTN, DMII, and CML and Chronic Back Pain disease process and self health management plan  take all medications exactly as prescribed and will call provider for medication related questions attend all scheduled medical appointments demonstrate ongoing adherence to prescribed treatment plan for HTN, DMII, and CML and Chronic Back Pain as evidenced by daily monitoring and recording of CBG, adherence to ADA/ carb modified diet, adherence to prescribed medication regimen, contacting provider for new or worsened symptoms or questions  demonstrate ongoing health management independence  continue to work with RN Care Manager to address care management and care coordination needs related to HTN, DMII, and CML and Chronic Back Pain  work with pharmacist to address complex medication regimen  related to HTN, DMII, and CML and Chronic Back Pain work with Education officer, museum to address financial constraints related to overdue payment of back taxes and potential risk of losing home, Limited social support and counseling for anxiety/stress related to  the management of HTN, DMII, and CML and Chronic Back Pain. collaborate with the care management team towards completion of advanced directives  through collaboration with RN Care manager, provider, and care team.   Interventions: Inter-disciplinary care team collaboration (see longitudinal plan of care) Evaluation of current treatment plan related to  self management and patient's adherence to plan as  established by provider Collaboration with PCP office for Tramadol refill at patient request. BSW referral for resources-completed. Collaborated with BSW  Diabetes:  (Status: Goal on Track (progressing): YES.) Lab Results  Component Value Date   HGBA1C 6.1 (A) 01/16/2022                                                                            6.6                                    04/04/22                                                                           7.3                                     08/09/22 Assessed patient's understanding of A1c goal: <6.5% Continue to provided education to patient about basic DM disease process; Reviewed medications with patient and discussed importance of medication adherence. Counseled on importance of regular laboratory monitoring as prescribed;        Discussed plans with patient for ongoing care management follow up and provided patient with direct contact information for care management team;      Reviewed scheduled/upcoming provider appointments including: See RNCM Clinical Goals above.       Call provider for findings outside established parameters.      Referral made to pharmacy team previously for assistance with complex medication regimen.  Patient doing well with medication compliance and his understanding of all medications.  Pharmacist referral not needed at this time.       Review of patient status, including review of consultants reports, relevant laboratory and other test results, and medications completed.  Patient understands the importance of drinking low sugar drinks and use of sugar substitutes.       Chronic Myeloid Leukemia (CML)  (Status: Goal on Track (progressing): YES.) Evaluation of current treatment plan related to  CML and patient's adherence to plan as established by provider.  Patient continues to be in remission and the plan is for patient to continue on Gleevec 400 mg daily.  Patient continues to take the [place] with  some protein to avoid having diarrhea.  Discussed plans with patient for ongoing care management follow up and provided patient with direct contact information for care management team Reviewed medications with patient and discussed the importance of taking all  medications as prescribed; Pharmacy referral for complex medication regimen made previously.  [facility] Pharmacist not needed at this time. 07/13/22: ONC appt 07/04/22-WNL  Hypertension: (Status: Goal on Track (progressing): YES.) Last practice recorded Blood Pressure readings: BP Readings from Last 3 Encounters:  01/16/22 115/80  12/26/21 112/76  09/22/21 (!) 144/90   06/13/22:  BP 145/87   07/30/22:   BP 120/78 Most recent eGFR/CrCl: No results found for: EGFR  No components found for: CRCL  Evaluation of current treatment plan related to hypertension self management and patient's adherence to plan as established by provider;   Reviewed medications with patient and discussed importance of compliance.  Discussed plans with patient for ongoing care management follow up and provided patient with direct contact information for care management team; Discussed patient's recent blood pressure readings at recent medical office visits.  Reviewed scheduled/upcoming provider appointments i Patient did obtain a blood pressure monitor through [facility].   Continued to re-enforce education on Orthostatic blood pressure changes that occur normally when changing positions from lying to sitting to standing.  Patient continues to report he does experience some lightheaded with moving from a sitting to a standing position which only last a few seconds.  Patient checks his blood pressure and heart rate at time of episode. Patient suspects caffeine may be making heart rate elevated. Instructed patient to change positions slowly and continue to monitor. Patient verbalized understanding.  Pain:  (Status: Goal on Track (progressing): YES.) Pain assessment  performed.  Patient reports chronic neck & back pain. Pain rating is on a Pain Scale of 0 - 10. Currently using Ibuprofen 600 mg PRN during the day and Tramadol at night with fair relief - able to sleep only 2 hours at a stretch.  Pain interferes with sleep due to staying in same position for 2 hours.  Per patient, his pain is either triggered by staying in same position for a prolonged period of time or by moving too much. Medications reviewed.  Reviewed provider established plan for pain management; Discussed importance of adherence to all scheduled medical appointments; Patient continues to report having Neuropathy on his left side of his body (arm and leg) causing numbness and lack of coordination with movement.    Will continue to monitor.    Pain better with Qutenza patch  Patient Goals/Self-Care Activities: Patient will self administer medications as prescribed as evidenced by self report/primary caregiver report  Patient will attend all scheduled provider appointments as evidenced by clinician review of documented attendance to scheduled appointments and patient/caregiver report Patient will call pharmacy for medication refills as evidenced by patient report and review of pharmacy fill history as appropriate Patient will continue to perform ADL's independently as evidenced by patient/caregiver report Patient will continue to perform IADL's independently as evidenced by patient/caregiver report Patient will call provider office for new concerns or questions as evidenced by review of documented incoming telephone call notes and patient report Patient will work with BSW to address care coordination needs and will continue to work with the clinical team to address health care and disease management related needs as evidenced by documented adherence to scheduled care management/care coordination appointments - check blood sugar at prescribed times: twice daily - enter blood sugar readings and  medication or insulin into daily log - take the blood sugar log to all doctor visits - check blood pressure weekly - write blood pressure results in a log or diary - keep a blood pressure log - take blood pressure  log to all doctor appointments - call doctor for signs and symptoms of high blood pressure - keep all doctor appointments - take medications for blood pressure exactly as prescribed   Follow Up:  Patient agrees to Care Plan and Follow-up.  Plan: The Managed Medicaid care management team will reach out to the patient again over the next 30 business  days. and The  Patient has been provided with contact information for the Managed Medicaid care management team and has been advised to call with any health related questions or concerns.  Date/time of next scheduled RN care management/care coordination outreach:  10/18/22 at [phone]

## 2022-09-21 ENCOUNTER — Encounter: Payer: Self-pay | Admitting: Physician Assistant

## 2022-09-21 ENCOUNTER — Ambulatory Visit (INDEPENDENT_AMBULATORY_CARE_PROVIDER_SITE_OTHER): Payer: Medicaid Other | Admitting: Physician Assistant

## 2022-09-21 ENCOUNTER — Ambulatory Visit: Payer: Medicaid Other

## 2022-09-21 VITALS — BP 150/91 | HR 92 | Ht 75.0 in | Wt 244.6 lb

## 2022-09-21 DIAGNOSIS — Z1211 Encounter for screening for malignant neoplasm of colon: Secondary | ICD-10-CM

## 2022-09-21 DIAGNOSIS — E1159 Type 2 diabetes mellitus with other circulatory complications: Secondary | ICD-10-CM

## 2022-09-21 DIAGNOSIS — I152 Hypertension secondary to endocrine disorders: Secondary | ICD-10-CM | POA: Diagnosis not present

## 2022-09-21 DIAGNOSIS — Z0000 Encounter for general adult medical examination without abnormal findings: Secondary | ICD-10-CM | POA: Diagnosis not present

## 2022-09-21 DIAGNOSIS — E1165 Type 2 diabetes mellitus with hyperglycemia: Secondary | ICD-10-CM | POA: Diagnosis not present

## 2022-09-21 DIAGNOSIS — B351 Tinea unguium: Secondary | ICD-10-CM | POA: Diagnosis not present

## 2022-09-21 MED ORDER — LISINOPRIL 20 MG PO TABS
20.0000 mg | ORAL_TABLET | Freq: Every day | ORAL | 3 refills | Status: DC
  Filled 2022-09-21: qty 90, 90d supply, fill #0
  Filled 2023-03-01: qty 90, 90d supply, fill #1

## 2022-09-21 NOTE — Assessment & Plan Note (Signed)
Ref to podiatry

## 2022-09-21 NOTE — Assessment & Plan Note (Signed)
Follows with endo [facility]  On insulin  Last A1c up to 7.4%

## 2022-09-21 NOTE — Assessment & Plan Note (Signed)
>>  ASSESSMENT AND PLAN FOR ESSENTIAL HYPERTENSION, BENIGN WRITTEN ON 09/21/2022  4:50 PM BY DRUBEL, LINDSAY, PA-C  Elevated in office, increase lisinopril to 20 mg  F/u 4-6 weeks

## 2022-09-21 NOTE — Assessment & Plan Note (Signed)
Elevated in office, increase lisinopril to 20 mg  F/u 4-6 weeks

## 2022-09-21 NOTE — Progress Notes (Signed)
I,Sha'taria Tyson,acting as a Education administrator for [employer], PA-C.,have documented all relevant documentation on the behalf of Gerald Kirschner, PA-C,as directed by  Gerald Kirschner, PA-C while in the presence of Gerald Kirschner, PA-C.   Complete physical exam   Patient: Gerald Olson   DOB: 25-Jul-1966   57 y.o. Male  MRN: [id number] Visit Date: 09/21/2022  Today's healthcare provider: [name], PA-C   Cc. cpe  Subjective    Gerald Olson is a 57 y.o. male who presents today for a complete physical exam.  He reports consuming a general diet. The patient does not participate in regular exercise at present. He generally feels poorly. He reports sleeping poorly. He does have additional problems to discuss today.  HPI   -Patient reports concern with feet, toenails turning green and breaking off. He uses topical things with no improvement.    Blood pressure at home has ranged 120s-130s/90s   Past Medical History:  Diagnosis Date   ADHD (attention deficit hyperactivity disorder)    Diagnosed during teenage years   Alcohol use disorder    On average 14-21 drinks per week   Alcoholic cirrhosis of liver    Per patient - condition is not related to moderate alcohol use but rather prolonged NSAID use and recent abuse.   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[place]   Major depressive disorder    Other spondylosis with radiculopathy, cervical region    Retinopathy    Right eye; per patient report   Type 2 diabetes mellitus with hyperglycemia    Past Surgical History:  Procedure Laterality Date   CHOLECYSTECTOMY N/A  06/02/2018   Procedure: LAPAROSCOPIC CHOLECYSTECTOMY;  Surgeon: Vickie Epley, MD;  Location: [facility] ORS;  Service: General;  Laterality: N/A;   KNEE ARTHROSCOPY     30 Years ago   lumbar five-sacral one posterior lumbar interbody fusion  01/24/2021   Social History   Socioeconomic History   Marital status: Single    Spouse name: Not on file   Number of children: Not on file   Years of education: 18   Highest education level: Master's degree (e.g., MA, MS, MEng, MEd, MSW, MBA)  Occupational History   Not on file  Tobacco Use   Smoking status: Former    Types: Cigarettes    Quit date: 06/25/2003    Years since quitting: 19.2   Smokeless tobacco: Never   Tobacco comments:    Currently vapes  Vaping Use   Vaping Use: Every day  Substance and Sexual Activity   Alcohol use: Yes    Alcohol/week: 14.0 - 21.0 standard drinks of alcohol    Types: 14 - 21 Cans of beer per week    Comment: 2-3 beers per night on average; sometimes up to 6   Drug use: Yes    Types: Marijuana    Comment: 1/week to help sleep; edibles, does not smoke   Sexual activity: Yes  Other Topics Concern   Not on file  Social History Narrative   Not on file  Social Determinants of Health   Financial Resource Strain: High Risk (09/19/2022)   Overall Financial Resource Strain (CARDIA)    Difficulty of Paying Living Expenses: Very hard  Food Insecurity: No Food Insecurity (06/13/2022)   Hunger Vital Sign    Worried About Running Out of Food in the Last Year: Never true    Ran Out of Food in the Last Year: Never true  Transportation Needs: No Transportation Needs (08/15/2022)   PRAPARE - Hydrologist (Medical): No    Lack of Transportation (Non-Medical): No  Physical Activity: Insufficiently Active (08/15/2022)   Exercise Vital Sign    Days of Exercise per Week: 3 days    Minutes of Exercise per Session: 10 min  Stress: Stress Concern Present (07/03/2021)   [place]    Feeling of Stress : Very much  Social Connections: Socially Isolated (09/19/2022)   Social Connection and Isolation Panel [NHANES]    Frequency of Communication with Friends and Family: More than three times a week    Frequency of Social Gatherings with Friends and Family: More than three times a week    Attends Religious Services: Never    Marine scientist or Organizations: No    Attends Archivist Meetings: Never    Marital Status: Never married  Intimate Partner Violence: Not At Risk (07/13/2022)   Humiliation, Afraid, Rape, and Kick questionnaire    Fear of Current or Ex-Partner: No    Emotionally Abused: No    Physically Abused: No    Sexually Abused: No   Family Status  Relation Name Status   Mother  Alive   Father  Deceased   Sister  (Not Specified)   Mat Aunt  (Not Specified)   MGF  (Not Specified)   Neg Hx  (Not Specified)   Family History  Problem Relation Age of Onset   Aneurysm Father    Drug abuse Sister    Multiple sclerosis Sister    Suicidality Maternal Aunt    Emphysema Maternal Grandfather    Diabetes Neg Hx    No Known Allergies  Patient Care Team: Gerald Kirschner, PA-C as PCP - General (Physician Assistant) Ethelda Chick as Social Worker [name], LCSW as [place]th[place] Management (Licensed Clinical Social Worker) Grayland Ormond, Kathlene November, MD as Consulting Physician (Oncology) Craft, Lorel Monaco, RN as Case Manager   Medications: Outpatient Medications Prior to Visit  Medication Sig   Blood Pressure Monitoring (BLOOD PRESSURE MONITOR AUTOMAT) DEVI Check blood pressure in AM and PM daily   dapagliflozin propanediol (FARXIGA) 5 MG TABS tablet Take 1 tablet (5 mg total) by mouth daily before breakfast.   diclofenac (VOLTAREN) 50 MG EC tablet Take 1 tablet (50 mg total) by mouth 2 (two) times daily.   glucose blood (ACCU-CHEK GUIDE) test strip USE AS DIRECTED  2 TIMES DAILY   imatinib (GLEEVEC) 400 MG tablet Take 1 tablet (400 mg total) by mouth daily.   insulin glargine (LANTUS SOLOSTAR) 100 UNIT/ML Solostar Pen Inject 7 Units into the skin every morning.   Insulin Pen Needle (UNIFINE PENTIPS) 32G X 4 MM MISC Use to inject Victoza once a day   pregabalin (LYRICA) 100 MG capsule Take 1 capsule (100 mg total) by mouth 2 (two) times daily.   [DISCONTINUED] lisinopril (ZESTRIL) 10 MG tablet Take 1 tablet by mouth daily.   No facility-administered medications prior to visit.  Review of Systems  Constitutional:  Negative for fatigue and fever.  Respiratory:  Negative for cough and shortness of breath.   Cardiovascular:  Negative for chest pain, palpitations and leg swelling.  Musculoskeletal:  Positive for arthralgias, back pain and neck pain.  Neurological:  Negative for dizziness and headaches.     Objective    Blood pressure (!) 150/91, pulse 92, height '6\' 3"'$  (1.905 m), weight 244 lb 9.6 oz (110.9 kg), SpO2 97 %.    Physical Exam Constitutional:      General: He is awake.     Appearance: He is well-developed.  HENT:     Head: Normocephalic.  Eyes:     Conjunctiva/sclera: Conjunctivae normal.  Cardiovascular:     Rate and Rhythm: Normal rate and regular rhythm.     Heart sounds: Normal heart sounds.  Pulmonary:     Effort: Pulmonary effort is normal.     Breath sounds: Normal breath sounds.  Feet:     Right foot:     Protective Sensation: 4 sites tested.  3 sites sensed.     Skin integrity: Skin integrity normal.     Toenail Condition: Right toenails are abnormally thick. Fungal disease present.    Left foot:     Protective Sensation: 4 sites tested.  0 sites sensed.     Skin integrity: Skin integrity normal.     Toenail Condition: Left toenails are abnormally thick. Fungal disease present. Skin:    General: Skin is warm.  Neurological:     Mental Status: He is alert and oriented to person, place, and time.  Psychiatric:         Attention and Perception: Attention normal.        Mood and Affect: Mood normal.        Speech: Speech normal.        Behavior: Behavior is cooperative.      Last depression screening scores    06/15/2022    1:32 PM 05/18/2022    2:43 PM 10/17/2021    1:17 PM  PHQ 2/9 Scores  PHQ - 2 Score 6 6   PHQ- 9 Score  21      Information is confidential and restricted. Go to Review Flowsheets to unlock data.   Last fall risk screening    08/03/2022    3:24 PM  [place] in the past year? 0   Last Audit-C alcohol use screening    07/13/2022    3:41 PM  Alcohol Use Disorder Test (AUDIT)  1. How often do you have a drink containing alcohol? 4  2. How many drinks containing alcohol do you have on a typical day when you are drinking? 1  3. How often do you have six or more drinks on one occasion? 0  AUDIT-C Score 5  4. How often during the last year have you found that you were not able to stop drinking once you had started? 0  5. How often during the last year have you failed to do what was normally expected from you because of drinking? 0  6. How often during the last year have you needed a first drink in the morning to get yourself going after a heavy drinking session? 0  7. How often during the last year have you had a feeling of guilt of remorse after drinking? 0  8. How often during the last year have you been unable to remember what happened the night before because  you had been drinking? 0  9. Have you or someone else been injured as a result of your drinking? 0  10. Has a relative or friend or a doctor or another health worker been concerned about your drinking or suggested you cut down? 4  Alcohol Use Disorder Identification Test Final Score (AUDIT) 9   A score of 3 or more in women, and 4 or more in men indicates increased risk for alcohol abuse, EXCEPT if all of the points are from question 1   No results found for any visits on 09/21/22.  Assessment & Plan     Routine Health Maintenance and Physical Exam  Exercise Activities and Dietary recommendations --balanced diet high in fiber and protein, low in sugars, carbs, fats. --physical activity/exercise 30 minutes 3-5 times a week    Immunization History  Administered Date(s) Administered   Tdap 04/25/2018    Health Maintenance  Topic Date Due   COVID-19 Vaccine (1) Never done   Zoster Vaccines- Shingrix (1 of 2) Never done   COLONOSCOPY (Pts 45-48yr Insurance coverage will need to be confirmed)  Never done   FOOT EXAM  06/19/2022   INFLUENZA VACCINE  11/25/2022 (Originally 03/27/2022)   Diabetic kidney evaluation - Urine ACR  01/17/2023   HEMOGLOBIN A1C  02/08/2023   OPHTHALMOLOGY EXAM  06/07/2023   Diabetic kidney evaluation - eGFR measurement  06/21/2023   DTaP/Tdap/Td (2 - Td or Tdap) 04/25/2028   Hepatitis C Screening  Completed   HIV Screening  Completed   HPV VACCINES  Aged Out    Discussed health benefits of physical activity, and encouraged him to engage in regular exercise appropriate for his age and condition.  Problem List Items Addressed This Visit       Cardiovascular and Mediastinum   Essential hypertension, benign    Elevated in office, increase lisinopril to 20 mg  F/u 4-6 weeks      Relevant Medications   lisinopril (ZESTRIL) 20 MG tablet     Endocrine   Type 2 diabetes mellitus with hyperglycemia    Follows with endo Uacr utd  On insulin  Last A1c up to 7.4%      Relevant Medications   lisinopril (ZESTRIL) 20 MG tablet   Other Relevant Orders   Lipid panel     Musculoskeletal and Integument   Onychomycosis    Ref to podiatry      Relevant Orders   Ambulatory referral to Podiatry   Other Visit Diagnoses     Annual physical exam    -  Primary   Colon cancer screening       Relevant Orders   Cologuard        Return in about 4 weeks (around 10/19/2022) for hypertension.     I, LMikey Kirschner PA-C have reviewed all documentation for  this visit. The documentation on 09/21/22 for the exam, diagnosis, procedures, and orders are all accurate and complete.  LMikey Kirschner PA-C [facility] [place] [address]Office: [phone]: [phone]

## 2022-09-22 ENCOUNTER — Other Ambulatory Visit (HOSPITAL_COMMUNITY): Payer: Self-pay

## 2022-09-28 ENCOUNTER — Other Ambulatory Visit (HOSPITAL_COMMUNITY): Payer: Self-pay

## 2022-10-02 ENCOUNTER — Ambulatory Visit: Payer: Medicaid Other | Admitting: Podiatry

## 2022-10-02 DIAGNOSIS — Z79899 Other long term (current) drug therapy: Secondary | ICD-10-CM

## 2022-10-02 DIAGNOSIS — B351 Tinea unguium: Secondary | ICD-10-CM | POA: Diagnosis not present

## 2022-10-02 NOTE — Progress Notes (Signed)
Subjective:  Patient ID: Gerald Olson, male    DOB: 11-08-1965,  MRN: [id number]  Chief Complaint  Patient presents with   Nail Problem    Possible nail fungus     57 y.o. male presents with the above complaint.  Patient presents with thickened elongated dystrophic toenails x 10.  He states been present for quite some time he is not taking anything for nail fungus.  He would like to discuss treatment options for it.  He is tried some topical stuff which has not helped.  Causing him some pain 1 out of 10.   Review of Systems: Negative except as noted in the HPI. Denies N/V/F/Ch.  Past Medical History:  Diagnosis Date   ADHD (attention deficit hyperactivity disorder)    Diagnosed during teenage years   Alcohol use disorder    On average 14-21 drinks per week   Alcoholic cirrhosis of liver    Per patient - condition is not related to moderate alcohol use but rather prolonged NSAID use and recent abuse.   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[place]   Major depressive disorder    Other spondylosis with radiculopathy, cervical region    Retinopathy    Right eye; per patient report   Type 2 diabetes mellitus with hyperglycemia     Current Outpatient Medications:    Blood Pressure Monitoring (BLOOD PRESSURE MONITOR AUTOMAT) DEVI, Check blood pressure in AM and PM daily, Disp: 1 each, Rfl: 0   dapagliflozin propanediol (FARXIGA) 5 MG TABS tablet, Take 1 tablet (5 mg total) by mouth daily before breakfast., Disp: 90 tablet, Rfl: 3   diclofenac (VOLTAREN) 50 MG EC tablet, Take 1 tablet (50 mg total) by mouth 2 (two) times  daily., Disp: 60 tablet, Rfl: 2   glucose blood (ACCU-CHEK GUIDE) test strip, USE AS DIRECTED 2 TIMES DAILY, Disp: 180 strip, Rfl: 0   imatinib (GLEEVEC) 400 MG tablet, Take 1 tablet (400 mg total) by mouth daily., Disp: 30 tablet, Rfl: 5   insulin glargine (LANTUS SOLOSTAR) 100 UNIT/ML Solostar Pen, Inject 7 Units into the skin every morning., Disp: 15 mL, Rfl: 3   lisinopril (ZESTRIL) 20 MG tablet, Take 1 tablet (20 mg total) by mouth daily., Disp: 90 tablet, Rfl: 3   pregabalin (LYRICA) 100 MG capsule, Take 1 capsule (100 mg total) by mouth 2 (two) times daily., Disp: 30 capsule, Rfl: 6  Social History   Tobacco Use  Smoking Status Former   Types: Cigarettes   Quit date: 06/25/2003   Years since quitting: 19.3  Smokeless Tobacco Never  Tobacco Comments   Currently vapes    No Known Allergies Objective:  There were no vitals filed for this visit. There is no height or weight on file to calculate BMI. Constitutional Well developed. Well nourished.  Vascular Dorsalis pedis pulses palpable bilaterally. Posterior tibial pulses palpable bilaterally. Capillary refill normal to all digits.  No cyanosis or clubbing noted. Pedal hair growth normal.  Neurologic Normal speech. Oriented to person, place, and time. Epicritic sensation to light touch  grossly present bilaterally.  Dermatologic Nails thickened elongated dystrophic mycotic toenails x 10 mild pain on palpation Skin within normal limits  Orthopedic: Normal joint ROM without pain or crepitus bilaterally. No visible deformities. No bony tenderness.   Radiographs: None Assessment:   1. Long-term use of high-risk medication   2. Onychomycosis due to dermatophyte   3. Nail fungus    Plan:  Patient was evaluated and treated and all questions answered.  Bilateral nail fungus/onychomycosis -Educated the patient on the etiology of onychomycosis and various treatment options associated with improving the fungal load.  I  explained to the patient that there is 3 treatment options available to treat the onychomycosis including topical, p.o., laser treatment.  Patient elected to undergo p.o. options with Lamisil/terbinafine therapy.  In order for me to start the medication therapy, I explained to the patient the importance of evaluating the liver and obtaining the liver function test.  Once the liver function test comes back normal I will start him on 58-monthcourse of Lamisil therapy.  Patient understood all risk and would like to proceed with Lamisil therapy.  I have asked the patient to immediately stop the Lamisil therapy if she has any reactions to it and call the office or go to the emergency room right away.  Patient states understanding   No follow-ups on file.

## 2022-10-03 ENCOUNTER — Other Ambulatory Visit: Payer: Self-pay | Admitting: *Deleted

## 2022-10-03 DIAGNOSIS — C9210 Chronic myeloid leukemia, BCR/ABL-positive, not having achieved remission: Secondary | ICD-10-CM

## 2022-10-04 ENCOUNTER — Inpatient Hospital Stay: Payer: Medicaid Other | Attending: Oncology

## 2022-10-04 ENCOUNTER — Inpatient Hospital Stay: Payer: Medicaid Other

## 2022-10-04 DIAGNOSIS — C9210 Chronic myeloid leukemia, BCR/ABL-positive, not having achieved remission: Secondary | ICD-10-CM

## 2022-10-04 DIAGNOSIS — C9211 Chronic myeloid leukemia, BCR/ABL-positive, in remission: Secondary | ICD-10-CM | POA: Diagnosis not present

## 2022-10-04 LAB — CBC WITH DIFFERENTIAL/PLATELET
Abs Immature Granulocytes: 0.03 10*3/uL (ref 0.00–0.07)
Basophils Absolute: 0 10*3/uL (ref 0.0–0.1)
Basophils Relative: 0 %
Eosinophils Absolute: 0.2 10*3/uL (ref 0.0–0.5)
Eosinophils Relative: 4 %
HCT: 35.2 % — ABNORMAL LOW (ref 39.0–52.0)
Hemoglobin: 11.3 g/dL — ABNORMAL LOW (ref 13.0–17.0)
Immature Granulocytes: 1 %
Lymphocytes Relative: 24 %
Lymphs Abs: 1.1 10*3/uL (ref 0.7–4.0)
MCH: 27.7 pg (ref 26.0–34.0)
MCHC: 32.1 g/dL (ref 30.0–36.0)
MCV: 86.3 fL (ref 80.0–100.0)
Monocytes Absolute: 0.6 10*3/uL (ref 0.1–1.0)
Monocytes Relative: 13 %
Neutro Abs: 2.7 10*3/uL (ref 1.7–7.7)
Neutrophils Relative %: 58 %
Platelets: 260 10*3/uL (ref 150–400)
RBC: 4.08 MIL/uL — ABNORMAL LOW (ref 4.22–5.81)
RDW: 14.9 % (ref 11.5–15.5)
WBC: 4.7 10*3/uL (ref 4.0–10.5)
nRBC: 0 % (ref 0.0–0.2)

## 2022-10-04 LAB — LIPID PANEL
Cholesterol: 101 mg/dL (ref 0–200)
HDL: 29 mg/dL — ABNORMAL LOW (ref >40)
LDL Cholesterol: 56 mg/dL (ref 0–99)
Total CHOL/HDL Ratio: 3.5 RATIO
Triglycerides: 78 mg/dL (ref <150)
VLDL: 16 mg/dL (ref 0–40)

## 2022-10-04 LAB — COMPREHENSIVE METABOLIC PANEL
ALT: 44 U/L (ref 0–44)
AST: 54 U/L — ABNORMAL HIGH (ref 15–41)
Albumin: 3.4 g/dL — ABNORMAL LOW (ref 3.5–5.0)
Alkaline Phosphatase: 77 U/L (ref 38–126)
Anion gap: 6 (ref 5–15)
BUN: 9 mg/dL (ref 6–20)
CO2: 24 mmol/L (ref 22–32)
Calcium: 8.3 mg/dL — ABNORMAL LOW (ref 8.9–10.3)
Chloride: 107 mmol/L (ref 98–111)
Creatinine, Ser: 0.73 mg/dL (ref 0.61–1.24)
GFR, Estimated: >60 mL/min (ref >60)
Glucose, Bld: 182 mg/dL — ABNORMAL HIGH (ref 70–99)
Potassium: 4.2 mmol/L (ref 3.5–5.1)
Sodium: 137 mmol/L (ref 135–145)
Total Bilirubin: 0.3 mg/dL (ref 0.3–1.2)
Total Protein: 6.3 g/dL — ABNORMAL LOW (ref 6.5–8.1)

## 2022-10-04 LAB — PHOSPHORUS: Phosphorus: 2.8 mg/dL (ref 2.5–4.6)

## 2022-10-04 LAB — MAGNESIUM: Magnesium: 2.3 mg/dL (ref 1.7–2.4)

## 2022-10-10 DIAGNOSIS — Z1211 Encounter for screening for malignant neoplasm of colon: Secondary | ICD-10-CM | POA: Diagnosis not present

## 2022-10-10 LAB — COLOGUARD: Cologuard: NEGATIVE

## 2022-10-12 LAB — BCR-ABL1, CML/ALL, PCR, QUANT: Interpretation (BCRAL):: NEGATIVE

## 2022-10-16 ENCOUNTER — Other Ambulatory Visit (HOSPITAL_COMMUNITY): Payer: Self-pay

## 2022-10-18 ENCOUNTER — Other Ambulatory Visit (HOSPITAL_COMMUNITY): Payer: Self-pay

## 2022-10-18 ENCOUNTER — Encounter: Payer: Self-pay | Admitting: Obstetrics and Gynecology

## 2022-10-18 ENCOUNTER — Other Ambulatory Visit: Payer: Medicaid Other | Admitting: Obstetrics and Gynecology

## 2022-10-18 MED FILL — Glucose Blood Test Strip: 25 days supply | Qty: 50 | Fill #1 | Status: AC

## 2022-10-18 NOTE — Patient Instructions (Signed)
Hi Mr. Vanderzwaag, thanks for speaking with me-hope you feel better.  Mr. Alcide was given information about Medicaid Managed Care team care coordination services as a part of their Healthy [facility] Medicaid benefit. [name] verbally consented to engagement with the [facility] Managed Care team.   If you are experiencing a medical emergency, please call 911 or report to your local emergency department or urgent care.   If you have a non-emergency medical problem during routine business hours, please contact your provider's office and ask to speak with a nurse.   For questions related to your Healthy [facility] health plan, please call: [phone] or visit the homepage here: [email or web address]  If you would like to schedule transportation through your Healthy [facility] plan, please call the following number at least 2 days in advance of your appointment: [phone]  For information about your ride after you set it up, call Ride Assist at [phone]. Use this number to activate a Will Call pickup, or if your transportation is late for a scheduled pickup. Use this number, too, if you need to make a change or cancel a previously scheduled reservation.  If you need transportation services right away, call [phone]. The after-hours call center is staffed 24 hours to handle ride assistance and urgent reservation requests (including discharges) 365 days a year. Urgent trips include sick visits, hospital discharge requests and life-sustaining treatment.  Call the [place] at [phone], at any time, 24 hours a day, 7 days a week. If you are in danger or need immediate medical attention call 911.  If you would like help to quit smoking, call 1-800-QUIT-NOW (276) 688-5008) OR Espaol: 1-855-Djelo-Ya QO:409462) o para ms informacin haga clic aqu or Text READY to 200-400 to register via text  Mr. Kloster - following are the goals  we discussed in your visit today:   Goals Addressed             This Visit's Progress    RNCM - Chronic Back Pain Management       Timeframe:  Long-Range Goal Priority:  High Start Date:   05/09/2021                          Expected End Date: ongoing  Patient Goals: Patient will self administer medications as prescribed Patient will attend all scheduled provider appointments Patient will call pharmacy for medication refills Patient will continue to perform ADL's independently Patient will continue to perform IADL's independently Patient will call provider office for new concerns or questions Patient will work with BSW to address care coordination needs and will continue to work with the clinical team to address health care and disease management related needs.   Patient's pain will be managed to a tolerable pain level where patient can be functional with ADL's and IADL's.    10/18/22- patient has all meds and taking all meds    RNCM - CML Disease Progression Minimized or Managed       Timeframe:  Long-Range Goal Priority:  High Start Date:     05/09/2021                       Expected End Date: ongoing     Patient Goals: Patient will self administer medications as prescribed Patient will attend all scheduled provider appointments Patient will call pharmacy for medication refills Patient will continue to perform ADL's independently Patient will continue to perform IADL's independently  Patient will call provider office for new concerns or questions Patient will work with BSW to address care coordination needs and will continue to work with the clinical team to address health care and disease management related needs.  10/18/22:  Followed by ONC-in remission     RNCM - Glycemic Management & Monitored       Timeframe:  Long-Range Goal Priority:  High Start Date:     05/09/2021                        Expected End Date:  ongoing  Patient Goals: Patient will self administer  medications as prescribed Patient will attend all scheduled provider appointments Patient will call pharmacy for medication refills Patient will continue to perform ADL's independently Patient will continue to perform IADL's independently Patient will call provider office for new concerns or questions Patient will work with BSW to address care coordination needs and will continue to work with the clinical team to address health care and disease management related needs.      Patient monitor blood sugars twice a day at varies times using Accu-check guide and record results. A1C level will remain at 6.0 or below.    10/18/22:  Does not check blood sugar, ENDO appt 3/14.    RNCM - Hypertension Monitored and Managed       Timeframe:  Long-Range Goal Priority:  High Start Date:  05/09/2021                           Expected End Date:  ongoing  Patient Goals Patient will self administer medications as prescribed Patient will attend all scheduled provider appointments Patient will call pharmacy for medication refills Patient will continue to perform ADL's independently Patient will continue to perform IADL's independently Patient will call provider office for new concerns or questions Patient will work with BSW to address care coordination needs and will continue to work with the clinical team to address health care and disease management related needs.    Patient will check blood pressure with new blood pressure monitor at least once a week and record readings.          10/18/22:  checks BP occasionally, 136/86 today   Patient verbalizes understanding of instructions and care plan provided today and agrees to view in [place]. Active MyChart status and patient understanding of how to access instructions and care plan via MyChart confirmed with patient.     The Managed Medicaid care management team will reach out to the patient again over the next 30 business  days.  The  Patient  has been  provided with contact information for the Managed Medicaid care management team and has been advised to call with any health related questions or concerns.   Aida Raider RN, BSN [place]  [employer] - Managed Medicaid High Risk [phone].   Following is a copy of your plan of care:  Care Plan : RNCM Plan of Care  Updates made by [name], RN since 10/18/2022 12:00 AM     Problem: Chronic Disease Management and Care Coordination Needs for DM, HTN, CML and Back Pain      Long-Range Goal: Plan of Care for Chronic Disease management and Care Coordination Needs (HTN, DM, CML, Chronic Back Pain)   Start Date: 05/09/2021  Expected End Date: 01/16/2023  Priority: High  Note:   Current Barriers:  Knowledge Deficits related to plan of care for management of HTN, DMII, and CML and Chronic Back Pain  Chronic Disease Management support and education needs related to HTN, DMII, and CML and Chronic Back Pain 10/18/22:  BP 136/86 today, checks occasionally.  Does not check blood sugar.    RNCM Clinical Goal(s):  Patient will verbalize understanding of plan for management of HTN, DMII, and CML and Chronic Back Pain verbalize basic understanding of HTN, DMII, and CML and Chronic Back Pain disease process and self health management plan  take all medications exactly as prescribed and will call provider for medication related questions attend all scheduled medical appointments demonstrate ongoing adherence to prescribed treatment plan for HTN, DMII, and CML and Chronic Back Pain as evidenced by daily monitoring and recording of CBG, adherence to ADA/ carb modified diet, adherence to prescribed medication regimen, contacting provider for new or worsened symptoms or questions  demonstrate ongoing health management independence  continue to work with RN Care Manager to address care management and care coordination needs related to HTN, DMII, and CML and  Chronic Back Pain  work with pharmacist to address complex medication regimen  related to HTN, DMII, and CML and Chronic Back Pain work with Education officer, museum to address financial constraints related to overdue payment of back taxes and potential risk of losing home, Limited social support and counseling for anxiety/stress related to the management of HTN, DMII, and CML and Chronic Back Pain. collaborate with the care management team towards completion of advanced directives  through collaboration with RN Care manager, provider, and care team.   Interventions: Inter-disciplinary care team collaboration (see longitudinal plan of care) Evaluation of current treatment plan related to  self management and patient's adherence to plan as established by provider Collaboration with PCP office for Tramadol refill at patient request. BSW referral for resources-completed. Collaborated with BSW  Diabetes:  (Status: Goal on Track (progressing): YES.) Lab Results  Component Value Date   HGBA1C 6.1 (A) 01/16/2022                                                                            6.6                                    04/04/22                                                                           7.3                                     08/09/22 Assessed patient's understanding of A1c goal: <6.5% Continue to provided education to patient about basic DM disease process; Reviewed medications with patient and discussed importance of medication adherence. Counseled on importance of regular laboratory  monitoring as prescribed;        Discussed plans with patient for ongoing care management follow up and provided patient with direct contact information for care management team;      Reviewed scheduled/upcoming provider appointments including: See RNCM Clinical Goals above.       Call provider for findings outside established parameters.      Referral made to pharmacy team previously for assistance with  complex medication regimen.  Patient doing well with medication compliance and his understanding of all medications.  Pharmacist referral not needed at this time.       Review of patient status, including review of consultants reports, relevant laboratory and other test results, and medications completed.  Patient understands the importance of drinking low sugar drinks and use of sugar substitutes.       Chronic Myeloid Leukemia (CML)  (Status: Goal on Track (progressing): YES.) Evaluation of current treatment plan related to  CML and patient's adherence to plan as established by provider.  Patient continues to be in remission and the plan is for patient to continue on Gleevec 400 mg daily.  Patient continues to take the [place] with some protein to avoid having diarrhea.  Discussed plans with patient for ongoing care management follow up and provided patient with direct contact information for care management team Reviewed medications with patient and discussed the importance of taking all medications as prescribed; Pharmacy referral for complex medication regimen made previously.  [facility] Pharmacist not needed at this time. 07/13/22: ONC appt 07/04/22-WNL  Hypertension: (Status: Goal on Track (progressing): YES.) Last practice recorded Blood Pressure readings: BP Readings from Last 3 Encounters:  01/16/22 115/80  12/26/21 112/76  09/22/21 (!) 144/90   06/13/22:  BP 145/87   07/30/22:   BP 120/70  10/18/22:  BP  136/86 Most recent eGFR/CrCl: No results found for: EGFR  No components found for: CRCL  Evaluation of current treatment plan related to hypertension self management and patient's adherence to plan as established by provider;   Reviewed medications with patient and discussed importance of compliance.  Discussed plans with patient for ongoing care management follow up and provided patient with direct contact information for care management team; Discussed patient's recent blood pressure  readings at recent medical office visits.  Reviewed scheduled/upcoming provider appointments i Patient did obtain a blood pressure monitor through [facility].   Continued to re-enforce education on Orthostatic blood pressure changes that occur normally when changing positions from lying to sitting to standing.  Patient continues to report he does experience some lightheaded with moving from a sitting to a standing position which only last a few seconds.  Patient checks his blood pressure and heart rate at time of episode. Patient suspects caffeine may be making heart rate elevated. Instructed patient to change positions slowly and continue to monitor. Patient verbalized understanding.  Pain:  (Status: Goal on Track (progressing): YES.) Pain assessment performed.  Patient reports chronic neck & back pain. Pain rating is on a Pain Scale of 0 - 10. Currently using Ibuprofen 600 mg PRN during the day and Tramadol at night with fair relief - able to sleep only 2 hours at a stretch.  Pain interferes with sleep due to staying in same position for 2 hours.  Per patient, his pain is either triggered by staying in same position for a prolonged period of time or by moving too much. Medications reviewed.  Reviewed provider established plan for pain management; Discussed importance of adherence to all  scheduled medical appointments; Patient continues to report having Neuropathy on his left side of his body (arm and leg) causing numbness and lack of coordination with movement.    Will continue to monitor.    Pain better with Qutenza patch  Patient Goals/Self-Care Activities: Patient will self administer medications as prescribed as evidenced by self report/primary caregiver report  Patient will attend all scheduled provider appointments as evidenced by clinician review of documented attendance to scheduled appointments and patient/caregiver report Patient will call pharmacy for medication refills as evidenced  by patient report and review of pharmacy fill history as appropriate Patient will continue to perform ADL's independently as evidenced by patient/caregiver report Patient will continue to perform IADL's independently as evidenced by patient/caregiver report Patient will call provider office for new concerns or questions as evidenced by review of documented incoming telephone call notes and patient report Patient will work with BSW to address care coordination needs and will continue to work with the clinical team to address health care and disease management related needs as evidenced by documented adherence to scheduled care management/care coordination appointments - check blood sugar at prescribed times: twice daily - enter blood sugar readings and medication or insulin into daily log - take the blood sugar log to all doctor visits - check blood pressure weekly - write blood pressure results in a log or diary - keep a blood pressure log - take blood pressure log to all doctor appointments - call doctor for signs and symptoms of high blood pressure - keep all doctor appointments - take medications for blood pressure exactly as prescribed

## 2022-10-18 NOTE — Patient Outreach (Signed)
Medicaid Managed Care   Nurse Care Manager Note  10/18/2022 Name:  Gerald Olson MRN:  [id number] DOB:  May 05, 1966  Gerald Olson is an 57 y.o. year old male who is a primary patient of Thedore Mins, Ria Comment, [place].  The [facility] Managed Care Coordination team was consulted for assistance with:    Chronic healthcare management needs, HTN, DM, chronic pain, anxiety/depression/ADHD, h/o leukemia  Gerald Olson was given information about Medicaid Managed Care Coordination team services today. Gerald Olson Patient agreed to services and verbal consent obtained.  Engaged with patient by telephone for follow up visit in response to provider referral for case management and/or care coordination services.   Assessments/Interventions:  Review of past medical history, allergies, medications, health status, including review of consultants reports, laboratory and other test data, was performed as part of comprehensive evaluation and provision of chronic care management services.  SDOH (Social Determinants of Health) assessments and interventions performed: SDOH Interventions    Flowsheet Row Patient Outreach Telephone from 10/18/2022 in [place] Patient Outreach Telephone from 09/19/2022 in [place] Patient Outreach Telephone from 08/15/2022 in [place] Patient Outreach Telephone from 07/13/2022 in [place] Patient Outreach Telephone from 06/13/2022 in [place] Patient Outreach Telephone from 03/28/2022 in [place] Interventions        Food Insecurity Interventions Other (Comment)  [patient given resources] -- -- -- Intervention Not Indicated --  Housing Interventions Other (Comment)  [patient provided with resources] -- -- -- -- --  Transportation Interventions -- -- Intervention Not Indicated -- --  Intervention Not Indicated  Utilities Interventions -- -- -- -- Intervention Not Indicated --  Alcohol Usage Interventions -- -- -- Alohol Education/Brief Advice  [providers have counseled patient regarding alcohol use] -- --  Financial Strain Interventions -- Other (Comment)  [BSW spoke with patient today and provided resources] -- -- -- --  Physical Activity Interventions -- -- Intervention Not Indicated  [unable to engage in strenuous to moderate exercise] -- -- --  Social Connections Interventions -- Intervention Not Indicated -- -- -- --     Care Plan  No Known Allergies  Medications Reviewed Today     Reviewed by Gerald Medicus, RN (Registered Nurse) on 10/18/22 at [place] Status: <None>   Medication Order Taking? Sig Documenting Provider Last Dose Status Informant  Blood Pressure Monitoring (BLOOD PRESSURE MONITOR AUTOMAT) DEVI [id number] No Check blood pressure in AM and PM daily Drubel, Ria Comment, PA-C Taking Active   dapagliflozin propanediol (FARXIGA) 5 MG TABS tablet [id number] No Take 1 tablet (5 mg total) by mouth daily before breakfast. [name], MD Taking Active   diclofenac (VOLTAREN) 50 MG EC tablet [id number] No Take 1 tablet (50 mg total) by mouth 2 (two) times daily. Jennye Boroughs, MD Taking Active   Patient not taking:  Discontinued 08/09/20 1309 (Patient Preference)   glucose blood (ACCU-CHEK GUIDE) test strip [id number] No USE AS DIRECTED 2 TIMES DAILY [name], MD Taking Active   imatinib (GLEEVEC) 400 MG tablet [id number] No Take 1 tablet (400 mg total) by mouth daily. Lloyd Huger, MD Taking Active   insulin glargine (LANTUS SOLOSTAR) 100 UNIT/ML Solostar Pen [id number] No Inject 7 Units into the skin every morning. [name], MD Taking Active   lisinopril (ZESTRIL) 20 MG tablet [id number]  Take 1 tablet (20 mg total) by mouth daily.  [name], PA-C  Active   pregabalin (LYRICA) 100 MG capsule [id number] No Take 1 capsule  (100 mg total) by mouth 2 (two) times daily. 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 08/09/2020   Mesenteric lymphadenopathy 06/14/2020   Splenomegaly- mild  06/14/2020   History of cannabis abuse 06/14/2020   Recurrent major depressive disorder, in partial remission ([place]) 06/09/2020   Anxiety 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 06/09/2020   Alcohol use disorder, moderate, dependence ([place]) 06/09/2020   Frequent falls 05/23/2020   Foraminal stenosis of cervical region 05/09/2020   Diabetic polyneuropathy    ADHD (attention deficit hyperactivity disorder)    Generalized anxiety disorder    Major depressive disorder    Cervical spondylosis 09/22/2019   Essential hypertension, benign 123456   Alcoholic cirrhosis of liver 06/02/2018   Type 2 diabetes mellitus with hyperglycemia 04/25/2018   Conditions to be addressed/monitored per PCP order:  Chronic healthcare management needs, HTN, DM, chronic pain, anxiety/depression/ADHD, h/o leukemia  Care Plan : RNCM Plan of Care  Updates made by Gerald Medicus, RN since 10/18/2022 12:00 AM     Problem: Chronic Disease Management and Care Coordination Needs for DM, HTN, CML and Back Pain      Long-Range Goal: Plan of Care for Chronic Disease management and Care Coordination Needs (HTN, DM, CML, Chronic Back Pain)   Start Date: 05/09/2021  Expected End Date: 01/16/2023  Priority: High  Note:   Current Barriers:  Knowledge Deficits related to plan of  care for management of HTN, DMII, and CML and Chronic Back Pain  Chronic Disease Management support and education needs related to HTN, DMII, and CML and Chronic Back Pain 10/18/22:  BP 136/86 today, checks occasionally.  Does not check blood sugar.    RNCM Clinical Goal(s):  Patient will verbalize understanding of plan for management of HTN, DMII, and CML and Chronic Back Pain verbalize basic understanding of HTN, DMII, and CML and Chronic Back Pain disease process and self health management plan  take all medications exactly as prescribed and will call provider for medication related questions attend all scheduled medical appointments demonstrate ongoing adherence to prescribed treatment plan for HTN, DMII, and CML and Chronic Back Pain as evidenced by daily monitoring and recording of CBG, adherence to ADA/ carb modified diet, adherence to prescribed medication regimen, contacting provider for new or worsened symptoms or questions  demonstrate ongoing health management independence  continue to work with RN Care Manager to address care management and care coordination needs related to HTN, DMII, and CML and Chronic Back Pain  work with pharmacist to address complex medication regimen  related to HTN, DMII, and CML and Chronic Back Pain work with Education officer, museum to address financial constraints related to overdue payment of back taxes and potential risk of losing home, Limited social support and counseling for anxiety/stress related to the management of HTN, DMII, and CML and Chronic Back Pain. collaborate with the care management team towards completion of advanced directives  through collaboration with RN Care manager, provider, and care team.   Interventions: Inter-disciplinary care team collaboration (see longitudinal plan of care) Evaluation of current treatment plan related to  self management and patient's adherence to plan as established by provider Collaboration with PCP office for  Tramadol refill at patient request. BSW referral for resources-completed. Collaborated with BSW  Diabetes:  (Status: Goal on Track (progressing): YES.) Lab Results  Component Value Date   HGBA1C 6.1 (A) 01/16/2022                                                                            6.6                                    04/04/22                                                                           7.3                                     08/09/22 Assessed patient's understanding of A1c goal: <6.5% Continue to provided education to patient about basic DM disease process; Reviewed medications with patient and discussed importance of medication adherence. Counseled on importance of regular laboratory monitoring as prescribed;        Discussed plans with patient for ongoing care management follow up and provided patient with direct contact information for care management team;      Reviewed scheduled/upcoming provider appointments including: See RNCM Clinical Goals above.       Call provider for findings outside established parameters.      Referral made to pharmacy team previously for assistance with complex medication regimen.  Patient doing well with medication compliance and his understanding of all medications.  Pharmacist referral not needed at this time.       Review of patient status, including review of consultants reports, relevant laboratory and other test results, and medications completed.  Patient understands the importance of drinking low sugar drinks and use of sugar substitutes.       Chronic Myeloid Leukemia (CML)  (Status: Goal on Track (progressing): YES.) Evaluation of current treatment plan related to  CML and patient's adherence to plan as established by provider.  Patient continues to be in remission and the plan is for patient to continue on Gleevec 400 mg daily.  Patient continues to take the [place] with some protein to avoid having diarrhea.  Discussed plans with  patient for ongoing care management follow up and provided patient with direct contact information for care management team Reviewed medications with patient and discussed the importance of taking all medications as prescribed; Pharmacy referral for complex medication regimen made previously.  [facility] Pharmacist not needed at this time. 07/13/22: ONC appt 07/04/22-WNL  Hypertension: (Status: Goal on Track (progressing): YES.) Last practice recorded Blood Pressure readings: BP Readings from Last 3 Encounters:  01/16/22 115/80  12/26/21 112/76  09/22/21 (!) 144/90   06/13/22:  BP 145/87   07/30/22:   BP 120/70  10/18/22:  BP  136/86 Most recent eGFR/CrCl: No results found for: EGFR  No components found for: CRCL  Evaluation of current treatment plan related to hypertension self management and patient's adherence to plan as established by provider;   Reviewed medications with patient and discussed importance of compliance.  Discussed plans with patient for ongoing care management follow up and provided patient with direct contact information for care management team; Discussed patient's recent blood pressure readings at recent medical office visits.  Reviewed scheduled/upcoming provider appointments i Patient did obtain a blood pressure monitor through [facility].   Continued to re-enforce education on Orthostatic blood pressure changes that occur normally when changing positions from lying to sitting to standing.  Patient continues to report he does experience some lightheaded with moving from a sitting to a standing position which only last a few seconds.  Patient checks his blood pressure and heart rate at time of episode. Patient suspects caffeine may be making heart rate elevated. Instructed patient to change positions slowly and continue to monitor. Patient verbalized understanding.  Pain:  (Status: Goal on Track (progressing): YES.) Pain assessment performed.  Patient reports chronic neck  & back pain. Pain rating is on a Pain Scale of 0 - 10. Currently using Ibuprofen 600 mg PRN during the day and Tramadol at night with fair relief - able to sleep only 2 hours at a stretch.  Pain interferes with sleep due to staying in same position for 2 hours.  Per patient, his pain is either triggered by staying in same position for a prolonged period of time or by moving too much. Medications reviewed.  Reviewed provider established plan for pain management; Discussed importance of adherence to all scheduled medical appointments; Patient continues to report having Neuropathy on his left side of his body (arm and leg) causing numbness and lack of coordination with movement.    Will continue to monitor.    Pain better with Qutenza patch  Patient Goals/Self-Care Activities: Patient will self administer medications as prescribed as evidenced by self report/primary caregiver report  Patient will attend all scheduled provider appointments as evidenced by clinician review of documented attendance to scheduled appointments and patient/caregiver report Patient will call pharmacy for medication refills as evidenced by patient report and review of pharmacy fill history as appropriate Patient will continue to perform ADL's independently as evidenced by patient/caregiver report Patient will continue to perform IADL's independently as evidenced by patient/caregiver report Patient will call provider office for new concerns or questions as evidenced by review of documented incoming telephone call notes and patient report Patient will work with BSW to address care coordination needs and will continue to work with the clinical team to address health care and disease management related needs as evidenced by documented adherence to scheduled care management/care coordination appointments - check blood sugar at prescribed times: twice daily - enter blood sugar readings and medication or insulin into daily log - take  the blood sugar log to all doctor visits - check blood pressure weekly - write blood pressure results in a log or diary - keep a blood pressure log - take blood pressure log to all doctor appointments - call doctor for signs and symptoms of high blood pressure - keep  all doctor appointments - take medications for blood pressure exactly as prescribed   Follow Up:  Patient agrees to Care Plan and Follow-up.  Plan: The Managed Medicaid care management team will reach out to the patient again over the next 30 business  days. and The  Patient has been provided with contact information for the Managed Medicaid care management team and has been advised to call with any health related questions or concerns.  Date/time of next scheduled RN care management/care coordination outreach: 11/19/22 at 315

## 2022-10-22 ENCOUNTER — Ambulatory Visit: Payer: Medicaid Other

## 2022-10-22 LAB — COLOGUARD: COLOGUARD: NEGATIVE

## 2022-10-24 ENCOUNTER — Other Ambulatory Visit: Payer: Medicaid Other

## 2022-10-24 ENCOUNTER — Other Ambulatory Visit: Payer: Self-pay

## 2022-10-24 NOTE — Patient Outreach (Signed)
  Medicaid Managed Care   Unsuccessful Outreach Note  10/24/2022 Name: Gerald Olson MRN: [id number] DOB: Jan 05, 1966  Referred by: [name], PA-C Reason for referral : High Risk Managed Medicaid (MM social work [employer] )   An unsuccessful telephone outreach was attempted today. The patient was referred to the case management team for assistance with care management and care coordination.   Follow Up Plan: The patient has been provided with contact information for the care management team and has been advised to call with any health related questions or concerns.   [name], BSW, [place] Managed Medicaid Team  [phone]

## 2022-10-24 NOTE — Patient Instructions (Signed)
  Medicaid Managed Care   Unsuccessful Outreach Note  10/24/2022 Name: Gerald Olson MRN: [id number] DOB: 1965-12-11  Referred by: [name], PA-C Reason for referral : High Risk Managed Medicaid (MM social work [employer] )   An unsuccessful telephone outreach was attempted today. The patient was referred to the case management team for assistance with care management and care coordination.   Follow Up Plan: The patient has been provided with contact information for the care management team and has been advised to call with any health related questions or concerns.   [name], BSW, [place] Managed Medicaid Team  [phone]

## 2022-11-02 ENCOUNTER — Encounter: Payer: Medicaid Other | Attending: Physical Medicine & Rehabilitation | Admitting: Physical Medicine & Rehabilitation

## 2022-11-02 ENCOUNTER — Encounter: Payer: Self-pay | Admitting: Physical Medicine & Rehabilitation

## 2022-11-02 VITALS — BP 117/68 | HR 82 | Ht 75.0 in | Wt 235.0 lb

## 2022-11-02 DIAGNOSIS — E1142 Type 2 diabetes mellitus with diabetic polyneuropathy: Secondary | ICD-10-CM | POA: Diagnosis not present

## 2022-11-02 MED ORDER — PREGABALIN 100 MG PO CAPS
100.0000 mg | ORAL_CAPSULE | Freq: Two times a day (BID) | ORAL | 6 refills | Status: DC
  Filled 2022-11-02 – 2022-11-13 (×2): qty 30, 15d supply, fill #0

## 2022-11-02 MED ORDER — DICLOFENAC SODIUM 50 MG PO TBEC
50.0000 mg | DELAYED_RELEASE_TABLET | Freq: Two times a day (BID) | ORAL | 1 refills | Status: DC
  Filled 2022-11-02 – 2022-11-13 (×2): qty 60, 30d supply, fill #0
  Filled 2023-03-01: qty 60, 30d supply, fill #1

## 2022-11-02 MED ORDER — CAPSAICIN-CLEANSING GEL 8 % EX KIT
4.0000 | PACK | Freq: Once | CUTANEOUS | Status: AC
  Administered 2022-11-05: 4 via TOPICAL

## 2022-11-02 NOTE — Progress Notes (Signed)
Subjective:    Patient ID: Gerald Olson, male    DOB: [birth year], 57 y.o.   MRN: [id number]  HPI HPI 05/18/22 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  Patient reports he had back and neck pain for many years with shooting pain down both legs and altered sensation in his feet.  He also developed diabetic polyneuropathy when he feels like this is his greatest area of pain at this time.  He has chronic burning sensations in his feet that is very severe and has not responded well to oral medications.  Reports his lower back pain and sciatica improved after having his lumbar surgery completed however he continues to have neck pain and he then developed worsening pain in his thoracic spine.  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  His back feels stiff throughout the day and impairs his activities. He reports he has failed multiple medications for his  neuropathy including Topamax, gabapentin, amitriptyline, Cymbalta.  He is not sure if he has used Lyrica in the past.  He uses a natural treatments are better for him and was interested in [facility].  He has used tramadol with benefit to his lower back pain, taking it mostly at night because he does not want to have any impairment during the day.  He does take ibuprofen for his back pain with some benefit.  Chiropractic care provided mild benefit to his back pain in the past.  He reports physical therapy did not help and worsened his back pain.  Patient does take occasional cannabis extract for his pain.  He also drinks alcohol 2-3 drinks regularly.   Visit 06/15/22 Gerald Olson is here for follow-up of his chronic pain and completion of Qutenza treatment.  He says he researched Qutenza and has no questions about the procedure.  He continues to have  neck and back pain worse around his thoracic spine felt to be related to DISH.  He has been taking ibuprofen for this pain.  He also uses wild lettuce as a pain treatment.  He has not used tramadol in several months and reports it was not providing much benefit.  He continues to use occasional alcohol and THC products.  Patient reports he has used Voltaren tablets in the past for his back pain and they worked better than ibuprofen.  He reports that he tried Lyrica about 10 years ago for his back pain and it did not provide significant improvement.  He has not tried this for his pain in his feet and is interested in trying this.  Interval History 08/03/2022 Gerald Olson is here for follow-up of his chronic pain.  He reports that Qutenza was beneficial to his pain for several weeks.  He did have some burning about 2 days after application of Qutenza but this has improved.  Burning pain in his feet is returning especially in the mornings and at night.  He uses a CBD balm that helps his foot pain as well.  He also finds that his pain improved with Lyrica 75 mg daily.  He is not having any side effects with this medication.  He has been busy at his house with repair of a water well pump.  He is not able to pick up diclofenac medication due to insurance however he would  like a new prescription for this medication.  Until this time he is using ibuprofen.  He continues to have pain throughout his lower,, mid back and neck.  Interval history 11/02/2022 Patient is here for repeat Qutenza.  Patient reports benefit with prior application of this medicine.  He had some mild discomfort in his feet the day after the procedure, however this resolved with CBD cream.  Lyrica 75 mg continues to provide benefit to his neuropathy pain as well.  He has been using diclofenac intermittently and finds this to be very helpful for his back and neck pain.  He has no questions about the procedure today.  Pain Inventory Average Pain 6 Pain  Right Now 9 My pain is sharp, burning, dull, stabbing, and aching  In the last 24 hours, has pain interfered with the following? General activity 9 Relation with others 5 Enjoyment of life 10 What TIME of day is your pain at its worst? morning  and daytime Sleep (in general) Poor  Pain is worse with: walking, bending, standing, and some activites Pain improves with: heat/ice and medication Relief from Meds: 5  Family History  Problem Relation Age of Onset   Aneurysm Father    Drug abuse Sister    Multiple sclerosis Sister    Suicidality Maternal Aunt    Emphysema Maternal Grandfather    Diabetes Neg Hx    Social History   Socioeconomic History   Marital status: Single    Spouse name: Not on file   Number of children: Not on file   Years of education: 80   Highest education level: Master's degree (e.g., MA, MS, MEng, MEd, MSW, MBA)  Occupational History   Not on file  Tobacco Use   Smoking status: Former    Types: Cigarettes    Quit date: 06/25/2003    Years since quitting: 19.3   Smokeless tobacco: Never   Tobacco comments:    Currently vapes  Vaping Use   Vaping Use: Every day  Substance and Sexual Activity   Alcohol use: Yes    Alcohol/week: 14.0 - 21.0 standard drinks of alcohol    Types: 14 - 21 Cans of beer per week    Comment: 2-3 beers per night on average; sometimes up to 6   Drug use: Yes    Types: Marijuana    Comment: 1/week to help sleep; edibles, does not smoke   Sexual activity: Yes  Other Topics Concern   Not on file  Social History Narrative   Not on file   Social Determinants of Health   Financial Resource Strain: High Risk (09/19/2022)   Overall Financial Resource Strain (CARDIA)    Difficulty of Paying Living Expenses: Very hard  Food Insecurity: Food Insecurity Present (10/18/2022)   Hunger Vital Sign    Worried About Running Out of Food in the Last Year: Sometimes true    Ran Out of Food in the Last Year: Sometimes true   Transportation Needs: No Transportation Needs (08/15/2022)   PRAPARE - Hydrologist (Medical): No    Lack of Transportation (Non-Medical): No  Physical Activity: Insufficiently Active (08/15/2022)   Exercise Vital Sign    Days of Exercise per Week: 3 days    Minutes of Exercise per Session: 10 min  Stress: Stress Concern Present (07/03/2021)   [place]    Feeling of Stress : Very much  Social Connections: Socially Isolated (09/19/2022)  Social Connection and Isolation Panel [NHANES]    Frequency of Communication with Friends and Family: More than three times a week    Frequency of Social Gatherings with Friends and Family: More than three times a week    Attends Religious Services: Never    Marine scientist or Organizations: No    Attends Archivist Meetings: Never    Marital Status: Never married   Past Surgical History:  Procedure Laterality Date   CHOLECYSTECTOMY N/A 06/02/2018   Procedure: LAPAROSCOPIC CHOLECYSTECTOMY;  Surgeon: Vickie Epley, MD;  Location: [facility] ORS;  Service: General;  Laterality: N/A;   KNEE ARTHROSCOPY     30 Years ago   lumbar five-sacral one posterior lumbar interbody fusion  01/24/2021   Past Surgical History:  Procedure Laterality Date   CHOLECYSTECTOMY N/A 06/02/2018   Procedure: LAPAROSCOPIC CHOLECYSTECTOMY;  Surgeon: Vickie Epley, MD;  Location: [facility] ORS;  Service: General;  Laterality: N/A;   KNEE ARTHROSCOPY     30 Years ago   lumbar five-sacral one posterior lumbar interbody fusion  01/24/2021   Past Medical History:  Diagnosis Date   ADHD (attention deficit hyperactivity disorder)    Diagnosed during teenage years   Alcohol use disorder    On average 14-21 drinks per week   Alcoholic cirrhosis of liver    Per patient - condition is not related to moderate alcohol use but rather prolonged NSAID use and recent abuse.    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[place]   Major depressive disorder    Other spondylosis with radiculopathy, cervical region    Retinopathy    Right eye; per patient report   Type 2 diabetes mellitus with hyperglycemia    BP 117/68   Pulse 82   Ht '6\' 3"'$  (1.905 m)   Wt 106.6 kg   SpO2 95%   BMI 29.37 kg/m   Opioid Risk Score:   Fall Risk Score:  `1  Depression screen [facility] 2/9     11/02/2022    3:03 PM 06/15/2022    1:32 PM 05/18/2022    2:43 PM 10/17/2021    1:17 PM 09/20/2021    3:27 PM 06/20/2021    1:28 PM 05/26/2021    3:22 PM  Depression screen PHQ 2/9  Decreased Interest 0 '3 3  3 3 3  '$ Down, Depressed, Hopeless 0 '3 3  3 2 2  '$ PHQ - 2 Score 0 '6 6  6 5 5  '$ Altered sleeping   '3  3 3 3  '$ Tired, decreased energy   '3  3 3 3  '$ Change in appetite   '3  3 2 1  '$ Feeling bad or failure about yourself    '3  3 1 3  '$ Trouble concentrating   '2  3 1 2  '$ Moving slowly or fidgety/restless   1  0 0 0  Suicidal thoughts   0  1 0 0  PHQ-9 Score   '21  22 15 17  '$ Difficult doing work/chores   Somewhat difficult  Not  difficult at all Somewhat difficult Very difficult     Information is confidential and restricted. Go to Review Flowsheets to unlock data.       Review of Systems  Musculoskeletal:  Positive for back pain.       Bilateral leg pain  All other systems reviewed and are negative.      Objective:   Physical Exam   Gen: no distress, normal appearing HEENT: oral mucosa pink and moist, NCAT Cardio: Reg rate Chest: normal effort, normal rate of breathing Abd: soft, non-distended Ext: no edema Psych: pleasant,  normal affect Skin: No significant wounds or skin breakdown in area where Qutenza will be applied, he has a very slight abrasion on the distal great toe-Qutenza will not be applied on this location neuro: Alert , normal speech language, cranial nerves II through XII grossly intact  Sensation decreased to light touch in a stocking glove distribution in bilateral lower extremities below the knee Strength 5/5 in all 4 extremities musculoskeletal:   Decreased motion at the lumbar and cervical spine in all directions Tenderness throughout paraspinals throughout L spine, T spine and C spine    L spine 01/24/2021 Intraoperative images document instrumented posterolateral and interbody PLIF L5-S1. The L5 pedicle screws are directed towards the superior endplate. Graft markers project in the interspace. Alignment preserved..   IMPRESSION: Limited intraoperative lateral view of the lumbar spine obtained for localization purposes     L spine MRI 11/22/2020 COMPARISON:  MRI lumbar spine July 20, 2018.   FINDINGS: Segmentation:  Standard.   Alignment:  Grade 1 anterolisthesis of L5 on S1, similar to prior.   Vertebrae: Vertebral body heights are maintained. No specific evidence of acute fracture, discitis/osteomyelitis, or suspicious bone lesion. Degenerative/discogenic endplate signal changes about the right eccentric L5-S1 disc.   Conus medullaris and cauda equina: Conus extends to the T12-L1 level. Conus appears normal.   Paraspinal and other soft tissues: Unremarkable.   Disc levels:   T12-L1: Only imaged sagittally. Disc bulging and facet hypertrophy without evidence of significant canal or foraminal stenosis.   L1-L2: Small broad disc bulge with small superimposed left subarticular disc protrusion. Mild facet hypertrophy. No significant canal or foraminal stenosis.   L2-L3: No significant disc protrusion, foraminal stenosis, or canal stenosis.   L3-L4: Similar left  foraminal/far lateral disc protrusion which contacts the exiting/exited left L3 nerve root without displacement (series 8, image 20). No significant canal or foraminal stenosis.   L4-L5: Broad disc bulge with superimposed left foraminal disc protrusion with annular fissure. Mild bilateral subarticular recess stenosis. No significant central canal stenosis. Mild left foraminal stenosis, similar to prior. No significant right foraminal stenosis.   L5-S1: Grade 1 anterolisthesis of L5 on S1. Uncovering of the disc with superimposed disc bulge and bilateral foraminal disc protrusions. Severe bilateral facet arthropathy/hypertrophy. Similar right greater than left subarticular recess stenosis with possible impingement of the descending right S1 nerve root. Similar moderate bilateral foraminal stenosis.   IMPRESSION: 1. Similar grade 1 anterolisthesis of L5 on S1 with right greater than left subarticular recess stenosis, possible impingement of the descending right S1 nerve root, and moderate right greater than left foraminal stenosis. 2. Similar disc bulge with annular fissure on the left at L4-L5 with mild left foraminal stenosis and mild bilateral subarticular recess stenosis. 3. Similar left foraminal/far lateral disc protrusion at L3-L4 which contacts the exiting/exited left L3 nerve root, unchanged.       C spine MRI 05/29/2020 FINDINGS: Alignment: Straightening of the  normal cervical lordosis. One or 2 mm of degenerative anterolisthesis at C7-T1.   Vertebrae: No fracture or primary bone lesion.   Cord: No primary cord lesion.  See below regarding stenosis.   Posterior Fossa, vertebral arteries, paraspinal tissues: Negative   Disc levels:   Foramen magnum is widely patent. No significant finding at the C1-2 level.   C2-3: Endplate osteophytes and bulging of the disc. No compressive central canal stenosis. Mild foraminal narrowing on the right.   C3-4: Endplate  osteophytes and bulging of the disc. AP diameter of the canal in the midline 9.3 mm. Bilateral foraminal encroachment right worse than left. Either C4 nerve could be affected.   C4-5: Endplate osteophytes and bulging of the disc. AP diameter of the canal in the midline 7.6 mm. Slight triangulation of the cord without abnormal cord signal. Bilateral bony foraminal stenosis could affect either C5 nerve.   C5-6: Spondylosis with endplate osteophytes and protruding disc material more prominent towards the left. AP diameter of the canal in the midline 6.8 mm. Effacement of the subarachnoid space and some cord deformity particularly on the left. No conclusive abnormal T2 signal within the cord. Bilateral foraminal stenosis could compress either or both C6 nerves.   C6-7: Endplate osteophytes and bulging of the disc. AP diameter of the canal in the midline 8.2 mm. No cord deformity. Bilateral foraminal stenosis left worse than right. Either C7 nerve could be affected, particularly the left.   C7-T1: Bilateral facet arthropathy with 1 or 2 mm of anterolisthesis. No disc pathology. No compressive canal stenosis. Bilateral foraminal stenosis could affect either C8 nerve. Edematous change of the facet joints could certainly relate to regional pain.   IMPRESSION: C2-3: Mild foraminal narrowing on the right.   C3-4: Spondylosis with bilateral foraminal narrowing right worse than left. Either C4 nerve could be affected.   C4-5: Spondylosis with bilateral foraminal narrowing. Either C5 nerve could be affected. Canal narrowing with AP diameter of 7.6 mm. Slight deformity of the cord without abnormal cord signal.   C5-6: Spondylosis with bilateral foraminal narrowing. Findings more pronounced on the left. Either C6 nerve could be affected. Canal narrowing worse on the left with deformity of the cord. I do not see conclusive T2 signal within the cord. AP diameter of the canal in the midline  only 6.8 mm. The patient could be at risk of developing myelopathy at this level.   C6-7: Spondylosis with bilateral foraminal narrowing. Either C7 nerve could be affected. Somewhat worse on the left.   C7-T1: Facet arthropathy with 1 or 2 mm of anterolisthesis and edematous change. Bilateral foraminal stenosis could affect either C8 nerve. The facet arthropathy is likely painful.      Assessment & Plan:   Diabetic polyneuropathy due to diabetes mellitus -Continue Lyrica 100 mg twice daily -4 patch of Qutenza was applied to the area of pain.  Blood pressure was monitored every 15 minutes. The patient tolerated the procedure well. Post-procedure instructions were given and follow-up has been scheduled.   -Will continue Qutenza treatment, next visit 3 months from prior treatment   Back and neck pain.  Imaging has shown lumbar and cervical spondylosis and he is status post PLIF L5-S1.  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  -prior use of tramadol however currently uses THC and alcohol products -We reorder Voltaren 50 mg twice daily, discussed stopping ibuprofen and any other NSAIDs

## 2022-11-03 ENCOUNTER — Other Ambulatory Visit (HOSPITAL_COMMUNITY): Payer: Self-pay

## 2022-11-08 ENCOUNTER — Ambulatory Visit: Payer: Medicaid Other | Admitting: Internal Medicine

## 2022-11-08 ENCOUNTER — Encounter: Payer: Self-pay | Admitting: Internal Medicine

## 2022-11-08 ENCOUNTER — Other Ambulatory Visit (HOSPITAL_COMMUNITY): Payer: Self-pay

## 2022-11-08 VITALS — BP 140/82 | HR 94 | Ht 75.0 in | Wt 238.0 lb

## 2022-11-08 DIAGNOSIS — E1165 Type 2 diabetes mellitus with hyperglycemia: Secondary | ICD-10-CM

## 2022-11-08 DIAGNOSIS — E785 Hyperlipidemia, unspecified: Secondary | ICD-10-CM

## 2022-11-08 LAB — POCT GLYCOSYLATED HEMOGLOBIN (HGB A1C): Hemoglobin A1C: 6.4 % — AB (ref 4.0–5.6)

## 2022-11-08 MED ORDER — LANTUS SOLOSTAR 100 UNIT/ML ~~LOC~~ SOPN
7.0000 [IU] | PEN_INJECTOR | SUBCUTANEOUS | 3 refills | Status: DC
  Filled 2022-11-08: qty 6, 85d supply, fill #0

## 2022-11-08 NOTE — Patient Instructions (Addendum)
Please continue: - Farxiga 5 mg daily in am  Please restart: - Lantus 7 units daily in am  Please return in 4-6 months with your sugar log.

## 2022-11-08 NOTE — Progress Notes (Signed)
Patient ID: Gerald Olson, male   DOB: 1966/07/13, 57 y.o.   MRN: [id number]  HPI: Gerald Olson is a 57 y.o.-year-old male, returning for follow-up for DM2 dx in 2019, with prediabetes 2006, insulin-dependent since 2020, uncontrolled, with complications (peripheral artery disease, diabetic retinopathy, peripheral neuropathy). Pt. previously saw Dr. Loanne [name], but last visit with me 3 months ago.  Interim history: No increased urination, blurry vision, nausea, chest pain. He has back pain-seen in the pain clinic. He drinks some beer to help with this. He lost approximately 5 pounds after starting Farxiga, but then gained 3 back.  Reviewed HbA1c: Lab Results  Component Value Date   HGBA1C 7.4 (A) 08/09/2022   HGBA1C 6.6 (A) 04/04/2022   HGBA1C 6.1 (A) 01/16/2022   HGBA1C 6.0 (A) 12/26/2021   HGBA1C 6.0 (A) 09/22/2021   HGBA1C 5.9 (A) 06/19/2021   HGBA1C 5.9 (A) 04/19/2021   HGBA1C 7.2 (H) 01/18/2021   HGBA1C 7.0 (A) 09/05/2020   HGBA1C 7.4 (H) 06/14/2020   Pt is on a regimen of: - Lantus 7 units in  am 6/7 days >> daily >> off completely for 2 week - Farxiga 5 mg daily in a.m.-restarted 07/2022  Pt checks his blood sugars 0 to once a day - am: 120-145 >> 112, 142 >> 92, 110-130, 170-180 after soup at night - 2h after b'fast: n/c - before lunch: n/c - 2h after lunch: n/c - before dinner: 160-190 >> n/c - 2h after dinner: n/c - bedtime: n/c - nighttime: n/c Lowest sugar was 110 >> 92; he has hypoglycemia awareness at 90.  Highest sugar was 190 >> 181.  Glucometer: Accu-Chek guide  Pt's meals are: - Breakfast: none - Lunch: none - Dinner: canned soup, hamburger helper or other meat, rice, beans, corn - Snacks: tea with stevia  - + CKD - saw [facility], last BUN/creatinine:  Lab Results  Component Value Date   BUN 9 10/04/2022   BUN 19 06/20/2022   CREATININE 0.73 10/04/2022   CREATININE 0.97 06/20/2022    Ref Range & Units 07/13/2021  Creatinine, Ur 20 - 320  mg/dL 189   Urine Protein/Creatinine Ratio 25 - 148 mg/g creat 407 High    Protein/Creatinine Ratio, Urine 0.025 - 0.148 mg/mg creat 0.407 High    Protein Urine Random 5 - 25 mg/dL 77 High    On lisinopril 10 mg daily.  -+ Dyslipidemia; last set of lipids: Lab Results  Component Value Date   CHOL 101 10/04/2022   HDL 29 (L) 10/04/2022   LDLCALC 56 10/04/2022   TRIG 78 10/04/2022   CHOLHDL 3.5 10/04/2022  He is not on a statin.  - last eye exam was on 05/2022. + NPDR OU, without macular edema.  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  - + numbness and tingling in his feet.  Last foot exam 06/19/2021. He is taking collagen + peptides + retinol. He also takes B vitamins with Benfotiamine for neuropathy.  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  ROS: + see HPI  Past Medical History:  Diagnosis Date   ADHD (attention deficit hyperactivity disorder)    Diagnosed during teenage years   Alcohol use disorder    On average 14-21 drinks per week   Alcoholic cirrhosis of liver    Per patient - condition is not related to moderate alcohol use but rather prolonged NSAID use and recent abuse.   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 [place]   Major depressive disorder    Other spondylosis with radiculopathy, cervical region    Retinopathy    Right eye; per patient report   Type 2 diabetes mellitus with hyperglycemia    Past Surgical History:  Procedure Laterality Date   CHOLECYSTECTOMY N/A 06/02/2018    Procedure: LAPAROSCOPIC CHOLECYSTECTOMY;  Surgeon: Vickie Epley, MD;  Location: [facility] ORS;  Service: General;  Laterality: N/A;   KNEE ARTHROSCOPY     30 Years ago   lumbar five-sacral one posterior lumbar interbody fusion  01/24/2021   Social History   Socioeconomic History   Marital status: Single    Spouse name: Not on file   Number of children: Not on file   Years of education: 18   Highest education level: Master's degree (e.g., MA, MS, MEng, MEd, MSW, MBA)  Occupational History   Not on file  Tobacco Use   Smoking status: Former    Types: Cigarettes    Quit date: 06/25/2003    Years since quitting: 19.3   Smokeless tobacco: Never   Tobacco comments:    Currently vapes  Vaping Use   Vaping Use: Every day  Substance and Sexual Activity   Alcohol use: Yes    Alcohol/week: 14.0 - 21.0 standard drinks of alcohol    Types: 14 - 21 Cans of beer per week    Comment: 2-3 beers per night on average; sometimes up to 6   Drug use: Yes    Types: Marijuana    Comment: 1/week to help sleep; edibles, does not smoke   Sexual activity: Yes  Other Topics Concern   Not on file  Social History Narrative   Not on file   Social Determinants of Health   Financial Resource Strain: High Risk (09/19/2022)   Overall Financial Resource Strain (CARDIA)    Difficulty of Paying Living Expenses: Very hard  Food Insecurity: Food Insecurity Present (10/18/2022)   Hunger Vital Sign    Worried About Running Out of Food in the Last Year: Sometimes true    Ran Out of Food in the Last Year: Sometimes true  Transportation Needs: No Transportation Needs (08/15/2022)   PRAPARE - Hydrologist (Medical): No    Lack of Transportation (Non-Medical): No  Physical Activity: Insufficiently Active (08/15/2022)   Exercise Vital Sign    Days of Exercise per Week: 3 days    Minutes of Exercise per Session: 10 min  Stress: Stress Concern Present (07/03/2021)   [place]    Feeling of Stress : Very much  Social Connections: Socially Isolated (09/19/2022)   Social Connection and Isolation Panel [NHANES]    Frequency of Communication with Friends and Family: More than three times a week    Frequency of Social Gatherings with Friends and Family: More than three times a week    Attends Religious Services: Never    Marine scientist or Organizations: No    Attends Archivist Meetings: Never  Marital Status: Never married  Intimate Partner Violence: Not At Risk (07/13/2022)   Humiliation, Afraid, Rape, and Kick questionnaire    Fear of Current or Ex-Partner: No    Emotionally Abused: No    Physically Abused: No    Sexually Abused: No   Current Outpatient Medications on File Prior to Visit  Medication Sig Dispense Refill   Blood Pressure Monitoring (BLOOD PRESSURE MONITOR AUTOMAT) DEVI Check blood pressure in AM and PM daily 1 each 0   dapagliflozin propanediol (FARXIGA) 5 MG TABS tablet Take 1 tablet (5 mg total) by mouth daily before breakfast. 90 tablet 3   diclofenac (VOLTAREN) 50 MG EC tablet Take 1 tablet (50 mg total) by mouth 2 (two) times daily. 60 tablet 1   glucose blood (ACCU-CHEK GUIDE) test strip USE AS DIRECTED 2 TIMES DAILY 180 strip 0   imatinib (GLEEVEC) 400 MG tablet Take 1 tablet (400 mg total) by mouth daily. 30 tablet 5   insulin glargine (LANTUS SOLOSTAR) 100 UNIT/ML Solostar Pen Inject 7 Units into the skin every morning. 15 mL 3   lisinopril (ZESTRIL) 20 MG tablet Take 1 tablet (20 mg total) by mouth daily. 90 tablet 3   pregabalin (LYRICA) 100 MG capsule Take 1 capsule (100 mg total) by mouth 2 (two) times daily. 30 capsule 6   [DISCONTINUED] DULoxetine (CYMBALTA) 60 MG capsule Take 1 capsule (60 mg total) by mouth 2 (two) times daily. (Patient not taking: Reported on 08/09/2020) 60 capsule 1   No current facility-administered medications on file prior to visit.    No Known Allergies Family History  Problem Relation Age of Onset   Aneurysm Father    Drug abuse Sister    Multiple sclerosis Sister    Suicidality Maternal Aunt    Emphysema Maternal Grandfather    Diabetes Neg Hx    PE: BP (!) 140/82 (BP Location: Right Arm, Patient Position: Sitting, Cuff Size: Normal)   Pulse 94   Ht '6\' 3"'$  (1.905 m)   Wt 238 lb (108 kg)   SpO2 99%   BMI 29.75 kg/m  Wt Readings from Last 3 Encounters:  11/08/22 238 lb (108 kg)  11/02/22 235 lb (106.6 kg)  09/21/22 244 lb 9.6 oz (110.9 kg)   Constitutional: overweight, in NAD Eyes: no exophthalmos ENT: no thyromegaly, no cervical lymphadenopathy Cardiovascular: tachycardia, RR, No MRG Respiratory: CTA B Musculoskeletal: no deformities Skin: no rashes Neurological: no tremor with outstretched hands Diabetic Foot Exam - Simple   Simple Foot Form Diabetic Foot exam was performed with the following findings: Yes 11/08/2022  4:25 PM  Visual Inspection No deformities, no ulcerations, no other skin breakdown bilaterally: Yes Sensation Testing See comments: Yes Pulse Check Posterior Tibialis and Dorsalis pulse intact bilaterally: Yes Comments Decreased sensation in R foot and no sensation in L forefoot    ASSESSMENT: 1. DM2, insulin-dependent, uncontrolled, with complications - PAD - DR - PN  2.  Dyslipidemia  PLAN:  1. Patient with longstanding, uncontrolled, type 2 diabetes, on oral antidiabetic regimen with SGLT2 inhibitor and also on low-dose long-acting insulin.  At last visit, HbA1c was higher, at 7.4%.  At that time, he was not able to obtain Farxiga through the patient assistance program.  Since last visit, we were able to restart this after this was approved by his insurance.  He also continues on 7 units of Lantus daily. -He was not checking blood sugars at last visit and we discussed about starting to do so.  He  was having problems affording food. -At today's visit, sugars are mostly at  goal in the morning except when he has couple of cans of soup with past at night.  This is not frequent, though.  However, he tells me that he ran out of Lantus 2 weeks ago after spacing it out and only having it approximately 3 days a week.  He did notice that the sugars are higher after he stopped Lantus completely.  Therefore, at this visit, we will restart this and continue [place].  He tolerates it well. - I suggested to:  Patient Instructions  Please continue: - Farxiga 5 mg daily in am  Please restart: - Lantus 7 units daily in am  Please return in 4-6 months with your sugar log.   - we checked his HbA1c: 6.4% (lower) - advised to check sugars at different times of the day - 1x a day, rotating check times - advised for yearly eye exams >> he is UTD - return to clinic in 4-6 months  2.  Dyslipidemia -Reviewed latest lipid panel from 09/2022: Fractions at goal with the exception of a low HDL: Lab Results  Component Value Date   CHOL 101 10/04/2022   HDL 29 (L) 10/04/2022   LDLCALC 56 10/04/2022   TRIG 78 10/04/2022   CHOLHDL 3.5 10/04/2022  -He is not on statin  [name], MD PhD [facility] Endocr[facility]

## 2022-11-09 ENCOUNTER — Other Ambulatory Visit (HOSPITAL_COMMUNITY): Payer: Self-pay

## 2022-11-09 ENCOUNTER — Other Ambulatory Visit (HOSPITAL_BASED_OUTPATIENT_CLINIC_OR_DEPARTMENT_OTHER): Payer: Self-pay

## 2022-11-13 ENCOUNTER — Other Ambulatory Visit: Payer: Self-pay

## 2022-11-13 ENCOUNTER — Other Ambulatory Visit (HOSPITAL_COMMUNITY): Payer: Self-pay

## 2022-11-14 ENCOUNTER — Other Ambulatory Visit: Payer: Self-pay

## 2022-11-14 ENCOUNTER — Other Ambulatory Visit (HOSPITAL_COMMUNITY): Payer: Self-pay

## 2022-11-14 ENCOUNTER — Other Ambulatory Visit: Payer: Self-pay | Admitting: Endocrinology

## 2022-11-14 MED ORDER — TECHLITE PEN NEEDLES 32G X 4 MM MISC
11 refills | Status: DC
  Filled 2022-11-14: qty 100, 34d supply, fill #0

## 2022-11-19 ENCOUNTER — Other Ambulatory Visit: Payer: Medicaid Other | Admitting: Obstetrics and Gynecology

## 2022-11-19 NOTE — Patient Instructions (Signed)
HI Mr. Vanloo, sorry to have missed you today, I hope you are doing okay  - as a part of your Medicaid benefit, you are eligible for care management and care coordination services at no cost or copay. I was unable to reach you by phone today but would be happy to help you with your health related needs. Please feel free to call me at [phone].  A member of the Managed Medicaid care management team will reach out to you again over the next 30 business  days.   Aida Raider RN, BSN   [facility] - Managed Medicaid High Risk [phone]

## 2022-11-19 NOTE — Patient Outreach (Signed)
  Medicaid Managed Care   Unsuccessful Outreach Note  11/19/2022 Name: Gerald Olson MRN: [id number] DOB: [birth year]  Referred by: [name], PA-C Reason for referral : High Risk Managed Medicaid (Unsuccessful telephone outreach)  An unsuccessful telephone outreach was attempted today. The patient was referred to the case management team for assistance with care management and care coordination.   Follow Up Plan: The patient has been provided with contact information for the care management team and has been advised to call with any health related questions or concerns.  The care management team will reach out to the patient again over the next 30 business  days.   Aida Raider RN, BSN [place]  [facility] Managed Medicaid High Risk [phone]

## 2022-12-05 ENCOUNTER — Other Ambulatory Visit: Payer: Medicaid Other | Admitting: Obstetrics and Gynecology

## 2022-12-05 ENCOUNTER — Encounter: Payer: Self-pay | Admitting: Obstetrics and Gynecology

## 2022-12-05 NOTE — Patient Instructions (Signed)
Visit Information  [name] was given information about Medicaid Managed Care team care coordination services as a part of their [facility] benefit. Gerald Olson verbally consented to [facility].   If you are experiencing a medical emergency, please call 911 or report to your local emergency department or urgent care.   If you have a non-emergency medical problem during routine business hours, please contact your provider's office and ask to speak with a nurse.   For questions related to your [facility] health plan, please call: [phone] or visit the homepage here: [email or web address]  If you would like to schedule transportation through your [facility] plan, please call the following number at least 2 days in advance of your appointment: [phone]  For information about your ride after you set it up, call Ride Assist at [phone]. Use this number to activate a Will Call pickup, or if your transportation is late for a scheduled pickup. Use this number, too, if you need to make a change or cancel a previously scheduled reservation.  If you need transportation services right away, call [phone]. The after-hours call center is staffed 24 hours to handle ride assistance and urgent reservation requests (including discharges) 365 days a year. Urgent trips include sick visits, hospital discharge requests and life-sustaining treatment.  Call the [facility] at [phone], at any time, 24 hours a day, 7 days a week. If you are in danger or need immediate medical attention call 911.  If you would like help to quit smoking, call 1-800-QUIT-NOW (351-066-00311-857 568 1935) OR Espaol: 1-855-Djelo-Ya (5-188-416-6063(1-787-062-6599) o para ms informacin haga clic aqu or Text READY to 016-010200-400 to register via text  [name] - following are the goals we discussed in your visit today:   Goals  Addressed             This Visit's Progress    RNCM - Chronic Back Pain Management       Timeframe:  Long-Range Goal Priority:  High Start Date:   05/09/2021                          Expected End Date: ongoing  Patient Goals: Patient will self administer medications as prescribed Patient will attend all scheduled provider appointments Patient will call pharmacy for medication refills Patient will continue to perform ADL's independently Patient will continue to perform IADL's independently Patient will call provider office for new concerns or questions Patient will work with BSW to address care coordination needs and will continue to work with the clinical team to address health care and disease management related needs.   Patient's pain will be managed to a tolerable pain level where patient can be functional with ADL's and IADL's.    12/05/22:  followed by pain mgt-Qutenza     RNCM - CML Disease Progression Minimized or Managed       Timeframe:  Long-Range Goal Priority:  High Start Date:     05/09/2021                       Expected End Date: ongoing  Patient Goals: Patient will self administer medications as prescribed Patient will attend all scheduled provider appointments Patient will call pharmacy for medication refills Patient will continue to perform ADL's independently Patient will continue to perform IADL's independently Patient will call provider office for new concerns or questions Patient will work with  BSW to address care coordination needs and will continue to work with the clinical team to address health care and disease management related needs.  12/05/22  Followed by ONC-in remission                     RNCM - Glycemic Management & Monitored       Timeframe:  Long-Range Goal Priority:  High Start Date:     05/09/2021                        Expected End Date:  ongoing  Patient Goals: Patient will self administer medications as prescribed Patient will attend  all scheduled provider appointments Patient will call pharmacy for medication refills Patient will continue to perform ADL's independently Patient will continue to perform IADL's independently Patient will call provider office for new concerns or questions Patient will work with BSW to address care coordination needs and will continue to work with the clinical team to address health care and disease management related needs.      Patient monitor blood sugars twice a day at varies times using Accu-check guide and record results. A1C level will remain at 6.0 or below.    12/05/22:  Blood sugar-157, A1C-6.4 on 11/08/22     RNCM - Hypertension Monitored and Managed       Timeframe:  Long-Range Goal Priority:  High Start Date:  05/09/2021                           Expected End Date:  ongoing  Patient Goals Patient will self administer medications as prescribed Patient will attend all scheduled provider appointments Patient will call pharmacy for medication refills Patient will continue to perform ADL's independently Patient will continue to perform IADL's independently Patient will call provider office for new concerns or questions Patient will work with BSW to address care coordination needs and will continue to work with the clinical team to address health care and disease management related needs.    Patient will check blood pressure with new blood pressure monitor at least once a week and record readings.          12/05/22:  checks BP occasionally-140/82   Patient verbalizes understanding of instructions and care plan provided today and agrees to view in MyChart. Active MyChart status and patient understanding of how to access instructions and care plan via MyChart confirmed with patient.     The Managed Medicaid care management team will reach out to the patient again over the next 60 business  days.  The  Patient  has been provided with contact information for the Managed Medicaid care  management team and has been advised to call with any health related questions or concerns.   Kathi Der RN, BSN [place]  [facility] Care Management Coordinator - Managed Medicaid High Risk [phone]   Following is a copy of your plan of care:  Care Plan : RNCM Plan of Care  Updates made by Danie Chandler, RN since 12/05/2022 12:00 AM     Problem: Chronic Disease Management and Care Coordination Needs for DM, HTN, CML and Back Pain      Long-Range Goal: Plan of Care for Chronic Disease management and Care Coordination Needs (HTN, DM, CML, Chronic Back Pain)   Start Date: 05/09/2021  Expected End Date: 03/06/2023  Priority: High  Note:   Current Barriers:  Knowledge Deficits  related to plan of care for management of HTN, DMII, and CML and Chronic Back Pain  Chronic Disease Management support and education needs related to HTN, DMII, and CML and Chronic Back Pain 12/05/22:  patient complaining of pain today, leg cramping-taking pain meds as needed.  Checks blood pressure and blood sugar occasionally.    RNCM Clinical Goal(s):  Patient will verbalize understanding of plan for management of HTN, DMII, and CML and Chronic Back Pain verbalize basic understanding of HTN, DMII, and CML and Chronic Back Pain disease process and self health management plan  take all medications exactly as prescribed and will call provider for medication related questions attend all scheduled medical appointments demonstrate ongoing adherence to prescribed treatment plan for HTN, DMII, and CML and Chronic Back Pain as evidenced by daily monitoring and recording of CBG, adherence to ADA/ carb modified diet, adherence to prescribed medication regimen, contacting provider for new or worsened symptoms or questions  demonstrate ongoing health management independence  continue to work with RN Care Manager to address care management and care coordination needs related to HTN, DMII, and CML and  Chronic Back Pain  work with pharmacist to address complex medication regimen  related to HTN, DMII, and CML and Chronic Back Pain work with Child psychotherapist to address financial constraints related to overdue payment of back taxes and potential risk of losing home, Limited social support and counseling for anxiety/stress related to the management of HTN, DMII, and CML and Chronic Back Pain. collaborate with the care management team towards completion of advanced directives  through collaboration with RN Care manager, provider, and care team.   Interventions: Inter-disciplinary care team collaboration (see longitudinal plan of care) Evaluation of current treatment plan related to  self management and patient's adherence to plan as established by provider Collaboration with PCP office for Tramadol refill at patient request. BSW referral for resources-completed. Collaborated with BSW  Diabetes:  (Status: Goal on Track (progressing): YES.) Lab Results  Component Value Date   HGBA1C 6.1 (A) 01/16/2022                                                                            6.6                                    04/04/22                                                                           7.3                                     08/09/22  6.4                                   11/08/22 Assessed patient's understanding of A1c goal: <6.5% Continue to provided education to patient about basic DM disease process; Reviewed medications with patient and discussed importance of medication adherence. Counseled on importance of regular laboratory monitoring as prescribed;        Discussed plans with patient for ongoing care management follow up and provided patient with direct contact information for care management team;      Reviewed scheduled/upcoming provider appointments including: See RNCM Clinical Goals above.       Call  provider for findings outside established parameters.      Referral made to pharmacy team previously for assistance with complex medication regimen.  Patient doing well with medication compliance and his understanding of all medications.  Pharmacist referral not needed at this time.       Review of patient status, including review of consultants reports, relevant laboratory and other test results, and medications completed.  Patient understands the importance of drinking low sugar drinks and use of sugar substitutes.       Chronic Myeloid Leukemia (CML)  (Status: Goal on Track (progressing): YES.) Evaluation of current treatment plan related to  CML and patient's adherence to plan as established by provider.  Patient continues to be in remission and the plan is for patient to continue on Gleevec 400 mg daily.  Patient continues to take the Gleevec with some protein to avoid having diarrhea.  Discussed plans with patient for ongoing care management follow up and provided patient with direct contact information for care management team Reviewed medications with patient and discussed the importance of taking all medications as prescribed; Pharmacy referral for complex medication regimen made previously.  [facility] Pharmacist not needed at this time. 07/13/22: ONC appt 07/04/22-WNL  Hypertension: (Status: Goal on Track (progressing): YES.) Last practice recorded Blood Pressure readings: BP Readings from Last 3 Encounters:  01/16/22 115/80  12/26/21 112/76  09/22/21 (!) 144/90   06/13/22:  BP 145/87   07/30/22:   BP 120/70  10/18/22:  BP  136/86 12/05/22:   BP  140/82 Most recent eGFR/CrCl: No results found for: EGFR  No components found for: CRCL  Evaluation of current treatment plan related to hypertension self management and patient's adherence to plan as established by provider;   Reviewed medications with patient and discussed importance of compliance.  Discussed plans with patient for ongoing care  management follow up and provided patient with direct contact information for care management team; Discussed patient's recent blood pressure readings at recent medical office visits.  Reviewed scheduled/upcoming provider appointments i Patient did obtain a blood pressure monitor through [facility].   Continued to re-enforce education on Orthostatic blood pressure changes that occur normally when changing positions from lying to sitting to standing.  Patient continues to report he does experience some lightheaded with moving from a sitting to a standing position which only last a few seconds.  Patient checks his blood pressure and heart rate at time of episode. Patient suspects caffeine may be making heart rate elevated. Instructed patient to change positions slowly and continue to monitor. Patient verbalized understanding.  Pain:  (Status: Goal on Track (progressing): YES.) Pain assessment performed.  Patient reports chronic neck & back pain. Pain rating is on a Pain Scale of 0 - 10. Currently using Ibuprofen 600  mg PRN during the day and Tramadol at night with fair relief - able to sleep only 2 hours at a stretch.  Pain interferes with sleep due to staying in same position for 2 hours.  Per patient, his pain is either triggered by staying in same position for a prolonged period of time or by moving too much. Medications reviewed.  Reviewed provider established plan for pain management; Discussed importance of adherence to all scheduled medical appointments; Patient continues to report having Neuropathy on his left side of his body (arm and leg) causing numbness and lack of coordination with movement.    Will continue to monitor.    Pain better with Qutenza patch  Patient Goals/Self-Care Activities: Patient will self administer medications as prescribed as evidenced by self report/primary caregiver report  Patient will attend all scheduled provider appointments as evidenced by clinician review  of documented attendance to scheduled appointments and patient/caregiver report Patient will call pharmacy for medication refills as evidenced by patient report and review of pharmacy fill history as appropriate Patient will continue to perform ADL's independently as evidenced by patient/caregiver report Patient will continue to perform IADL's independently as evidenced by patient/caregiver report Patient will call provider office for new concerns or questions as evidenced by review of documented incoming telephone call notes and patient report Patient will work with BSW to address care coordination needs and will continue to work with the clinical team to address health care and disease management related needs as evidenced by documented adherence to scheduled care management/care coordination appointments - check blood sugar at prescribed times: twice daily - enter blood sugar readings and medication or insulin into daily log - take the blood sugar log to all doctor visits - check blood pressure weekly - write blood pressure results in a log or diary - keep a blood pressure log - take blood pressure log to all doctor appointments - call doctor for signs and symptoms of high blood pressure - keep all doctor appointments - take medications for blood pressure exactly as prescribed

## 2022-12-05 NOTE — Patient Outreach (Signed)
Medicaid Managed Care   Nurse Care Manager Note  12/05/2022 Name:  Gerald Olson MRN:  [id number] DOB:  Feb 15, 1966  Gerald Olson is an 57 y.o. year old male who is a primary patient of Ok EdwardsDrubel, Lillia AbedLindsay, [place]PA-C.  [facility] for assistance with:    Chronic healthcare management needs, HTN, DM, chronic pain, anxiety/depression/ADHD, h/o leukemia, cervical and lumbar spondylosis  Gerald Olson was given information about Medicaid Managed Care Coordination team services today. Gerald Olson Patient agreed to services and verbal consent obtained.  Engaged with patient by telephone for follow up visit in response to provider referral for case management and/or care coordination services.   Assessments/Interventions:  Review of past medical history, allergies, medications, health status, including review of consultants reports, laboratory and other test data, was performed as part of comprehensive evaluation and provision of chronic care management services.  SDOH (Social Determinants of Health) assessments and interventions performed: SDOH Interventions    Flowsheet Row Patient Outreach Telephone from 12/05/2022 in [place] POPULATION HEALTH DEPARTMENT Patient Outreach Telephone from 10/18/2022 in [place] POPULATION HEALTH DEPARTMENT Patient Outreach Telephone from 09/19/2022 in [place] POPULATION HEALTH DEPARTMENT Patient Outreach Telephone from 08/15/2022 in [place] POPULATION HEALTH DEPARTMENT Patient Outreach Telephone from 07/13/2022 in [place] POPULATION HEALTH DEPARTMENT Patient Outreach Telephone from 06/13/2022 in [facility] Care Coordination  SDOH Interventions        Food Insecurity Interventions -- Other (Comment)  [patient given resources] -- -- -- Intervention Not Indicated  Housing Interventions -- Other (Comment)  [patient provided with resources] -- -- -- --  Transportation Interventions -- -- --  Intervention Not Indicated -- --  Utilities Interventions Intervention Not Indicated -- -- -- -- Intervention Not Indicated  Alcohol Usage Interventions -- -- -- -- Alohol Education/Brief Advice  [providers have counseled patient regarding alcohol use] --  Financial Strain Interventions -- -- Other (Comment)  [BSW spoke with patient today and provided resources] -- -- --  Physical Activity Interventions -- -- -- Intervention Not Indicated  [unable to engage in strenuous to moderate exercise] -- --  Stress Interventions Other (Comment)  [patient has been offered resources] -- -- -- -- --  Social Connections Interventions -- -- Intervention Not Indicated -- -- --     Care Plan  No Known Allergies  Medications Reviewed Today     Reviewed by Danie Chandlerraft, Journei Thomassen G, RN (Registered Nurse) on 12/05/22 at 1521  Med List Status: <None>   Medication Order Taking? Sig Documenting Provider Last Dose Status Informant  Blood Pressure Monitoring (BLOOD PRESSURE MONITOR AUTOMAT) DEVI [id number] No Check blood pressure in AM and PM daily Drubel, Lillia AbedLindsay, PA-C Taking Active   dapagliflozin propanediol (FARXIGA) 5 MG TABS tablet [id number] No Take 1 tablet (5 mg total) by mouth daily before breakfast. Carlus PavlovGherghe, Cristina, MD Taking Active   diclofenac (VOLTAREN) 50 MG EC tablet [id number]  Take 1 tablet (50 mg total) by mouth 2 (two) times daily. Fanny DanceShtridelman, Yuri, MD  Active   Patient not taking:  Discontinued 08/09/20 1309 (Patient Preference)   glucose blood (ACCU-CHEK GUIDE) test strip [id number] No USE AS DIRECTED 2 TIMES DAILY Carlus PavlovGherghe, Cristina, MD Taking Active   imatinib (GLEEVEC) 400 MG tablet [id number] No Take 1 tablet (400 mg total) by mouth daily. Jeralyn RuthsFinnegan, Timothy J, MD Taking Active   insulin glargine (LANTUS SOLOSTAR) 100 UNIT/ML Solostar Pen [id number]  Inject 7 Units into the skin every morning. Carlus PavlovGherghe, Cristina, MD  Active   Insulin Pen Needle (TECHLITE PEN NEEDLES) 32G X 4 MM MISC [id number]  Use to inject  Victoza once a day Carlus Pavlov, MD  Active   lisinopril (ZESTRIL) 20 MG tablet [id number] No Take 1 tablet (20 mg total) by mouth daily. Alfredia Ferguson, PA-C Taking Active   pregabalin (LYRICA) 100 MG capsule [id number]  Take 1 capsule (100 mg total) by mouth 2 (two) times daily. 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 08/09/2020   Mesenteric lymphadenopathy 06/14/2020   Splenomegaly- mild  06/14/2020   History of cannabis abuse 06/14/2020   Recurrent major depressive disorder, in partial remission 06/09/2020   Anxiety 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 06/09/2020   Alcohol use disorder, moderate, dependence 06/09/2020   Frequent falls 05/23/2020   Foraminal stenosis of cervical region 05/09/2020   Diabetic polyneuropathy    ADHD (attention deficit hyperactivity disorder)    Generalized anxiety disorder    Major depressive disorder    Cervical spondylosis 09/22/2019   Essential hypertension, benign 01/28/2019   Alcoholic cirrhosis of liver 06/02/2018   Type 2 diabetes mellitus with hyperglycemia 04/25/2018   Conditions to be addressed/monitored per PCP order:  Chronic healthcare management needs, HTN, DM, chronic pain, anxiety/depression/ADHD, h/o leukemia, cervical and lumbar spondylosis  Care Plan : RNCM Plan of Care  Updates made by Danie Chandler, RN since 12/05/2022 12:00 AM     Problem: Chronic Disease Management and Care Coordination Needs for DM, HTN, CML and Back Pain       Long-Range Goal: Plan of Care for Chronic Disease management and Care Coordination Needs (HTN, DM, CML, Chronic Back Pain)   Start Date: 05/09/2021  Expected End Date: 03/06/2023  Priority: High  Note:   Current Barriers:  Knowledge Deficits related to plan of care for management of HTN, DMII, and CML and Chronic Back Pain  Chronic Disease Management support and education needs related to HTN, DMII, and CML and Chronic Back Pain 12/05/22:  patient complaining of pain today, leg cramping-taking pain meds as needed.  Checks blood pressure and blood sugar occasionally.    RNCM Clinical Goal(s):  Patient will verbalize understanding of plan for management of HTN, DMII, and CML and Chronic Back Pain verbalize basic understanding of HTN, DMII, and CML and Chronic Back Pain disease process and self health management plan  take all medications exactly as prescribed and will call provider for medication related questions attend all scheduled medical appointments demonstrate ongoing adherence to prescribed treatment plan for HTN, DMII, and CML and Chronic Back Pain as evidenced by daily monitoring and recording of CBG, adherence to ADA/ carb modified diet, adherence to prescribed medication regimen, contacting provider for new or worsened symptoms or questions  demonstrate ongoing health management independence  continue to work with RN Care Manager to address care management and care coordination needs related to HTN, DMII, and CML and Chronic Back Pain  work with pharmacist to address complex medication regimen  related to HTN, DMII, and CML  and Chronic Back Pain work with Child psychotherapist to address financial constraints related to overdue payment of back taxes and potential risk of losing home, Limited social support and counseling for anxiety/stress related to the management of HTN, DMII, and CML and Chronic Back Pain. collaborate with the care management team towards completion of advanced directives   through collaboration with RN Care manager, provider, and care team.   Interventions: Inter-disciplinary care team collaboration (see longitudinal plan of care) Evaluation of current treatment plan related to  self management and patient's adherence to plan as established by provider Collaboration with PCP office for Tramadol refill at patient request. BSW referral for resources-completed. Collaborated with BSW  Diabetes:  (Status: Goal on Track (progressing): YES.) Lab Results  Component Value Date   HGBA1C 6.1 (A) 01/16/2022                                                                            6.6                                    04/04/22                                                                           7.3                                     08/09/22                                                                           6.4                                   11/08/22 Assessed patient's understanding of A1c goal: <6.5% Continue to provided education to patient about basic DM disease process; Reviewed medications with patient and discussed importance of medication adherence. Counseled on importance of regular laboratory monitoring as prescribed;        Discussed plans with patient for ongoing care management follow up and provided patient with direct contact information for care management team;      Reviewed scheduled/upcoming provider appointments including: See RNCM Clinical Goals above.       Call provider for findings outside established parameters.      Referral made to pharmacy team previously for assistance with complex medication regimen.  Patient doing well with medication compliance and his understanding of all medications.  Pharmacist referral not needed at  this time.       Review of patient status, including review of consultants reports, relevant laboratory and other test results, and medications completed.  Patient understands the importance of drinking low  sugar drinks and use of sugar substitutes.       Chronic Myeloid Leukemia (CML)  (Status: Goal on Track (progressing): YES.) Evaluation of current treatment plan related to  CML and patient's adherence to plan as established by provider.  Patient continues to be in remission and the plan is for patient to continue on Gleevec 400 mg daily.  Patient continues to take the Gleevec with some protein to avoid having diarrhea.  Discussed plans with patient for ongoing care management follow up and provided patient with direct contact information for care management team Reviewed medications with patient and discussed the importance of taking all medications as prescribed; Pharmacy referral for complex medication regimen made previously.  [facility] Pharmacist not needed at this time. 07/13/22: ONC appt 07/04/22-WNL  Hypertension: (Status: Goal on Track (progressing): YES.) Last practice recorded Blood Pressure readings: BP Readings from Last 3 Encounters:  01/16/22 115/80  12/26/21 112/76  09/22/21 (!) 144/90   06/13/22:  BP 145/87   07/30/22:   BP 120/70  10/18/22:  BP  136/86 12/05/22:   BP  140/82 Most recent eGFR/CrCl: No results found for: EGFR  No components found for: CRCL  Evaluation of current treatment plan related to hypertension self management and patient's adherence to plan as established by provider;   Reviewed medications with patient and discussed importance of compliance.  Discussed plans with patient for ongoing care management follow up and provided patient with direct contact information for care management team; Discussed patient's recent blood pressure readings at recent medical office visits.  Reviewed scheduled/upcoming provider appointments i Patient did obtain a blood pressure monitor through [facility].   Continued to re-enforce education on Orthostatic blood pressure changes that occur normally when changing positions from lying to sitting to standing.  Patient continues  to report he does experience some lightheaded with moving from a sitting to a standing position which only last a few seconds.  Patient checks his blood pressure and heart rate at time of episode. Patient suspects caffeine may be making heart rate elevated. Instructed patient to change positions slowly and continue to monitor. Patient verbalized understanding.  Pain:  (Status: Goal on Track (progressing): YES.) Pain assessment performed.  Patient reports chronic neck & back pain. Pain rating is on a Pain Scale of 0 - 10. Currently using Ibuprofen 600 mg PRN during the day and Tramadol at night with fair relief - able to sleep only 2 hours at a stretch.  Pain interferes with sleep due to staying in same position for 2 hours.  Per patient, his pain is either triggered by staying in same position for a prolonged period of time or by moving too much. Medications reviewed.  Reviewed provider established plan for pain management; Discussed importance of adherence to all scheduled medical appointments; Patient continues to report having Neuropathy on his left side of his body (arm and leg) causing numbness and lack of coordination with movement.    Will continue to monitor.    Pain better with Qutenza patch  Patient Goals/Self-Care Activities: Patient will self administer medications as prescribed as evidenced by self report/primary caregiver report  Patient will attend all scheduled provider appointments as evidenced by clinician review of documented attendance to scheduled appointments and patient/caregiver report Patient will call pharmacy for medication refills  as evidenced by patient report and review of pharmacy fill history as appropriate Patient will continue to perform ADL's independently as evidenced by patient/caregiver report Patient will continue to perform IADL's independently as evidenced by patient/caregiver report Patient will call provider office for new concerns or questions as evidenced  by review of documented incoming telephone call notes and patient report Patient will work with BSW to address care coordination needs and will continue to work with the clinical team to address health care and disease management related needs as evidenced by documented adherence to scheduled care management/care coordination appointments - check blood sugar at prescribed times: twice daily - enter blood sugar readings and medication or insulin into daily log - take the blood sugar log to all doctor visits - check blood pressure weekly - write blood pressure results in a log or diary - keep a blood pressure log - take blood pressure log to all doctor appointments - call doctor for signs and symptoms of high blood pressure - keep all doctor appointments - take medications for blood pressure exactly as prescribed   Follow Up:  Patient agrees to Care Plan and Follow-up.  Plan: The Managed Medicaid care management team will reach out to the patient again over the next 60 business  days. and The  Patient has been provided with contact information for the Managed Medicaid care management team and has been advised to call with any health related questions or concerns.  Date/time of next scheduled RN care management/care coordination outreach: 01/23/23 at 315.

## 2022-12-11 ENCOUNTER — Other Ambulatory Visit (HOSPITAL_COMMUNITY): Payer: Self-pay

## 2022-12-20 ENCOUNTER — Other Ambulatory Visit (HOSPITAL_COMMUNITY): Payer: Self-pay

## 2022-12-26 ENCOUNTER — Telehealth: Payer: Self-pay | Admitting: *Deleted

## 2022-12-26 NOTE — Telephone Encounter (Signed)
Patient is calling for lab results from 10/02/22,not seeing results in epic,called [facility] and they have no records of the labs, may have to reclip samples.Please advise.

## 2022-12-27 ENCOUNTER — Other Ambulatory Visit (HOSPITAL_COMMUNITY): Payer: Self-pay

## 2022-12-28 NOTE — Telephone Encounter (Signed)
Called and left message for patient to call back concerning labs.

## 2022-12-31 ENCOUNTER — Telehealth: Payer: Self-pay | Admitting: *Deleted

## 2022-12-31 NOTE — Telephone Encounter (Signed)
Called patient and updated per Dr Eliane Decree message, verbalized understanding.

## 2023-01-02 ENCOUNTER — Inpatient Hospital Stay: Payer: Medicaid Other

## 2023-01-03 ENCOUNTER — Telehealth: Payer: Medicaid Other | Admitting: Oncology

## 2023-01-17 ENCOUNTER — Other Ambulatory Visit (HOSPITAL_COMMUNITY): Payer: Self-pay

## 2023-01-22 ENCOUNTER — Other Ambulatory Visit (HOSPITAL_COMMUNITY): Payer: Self-pay

## 2023-01-23 ENCOUNTER — Other Ambulatory Visit: Payer: Self-pay

## 2023-01-23 ENCOUNTER — Encounter: Payer: Self-pay | Admitting: Obstetrics and Gynecology

## 2023-01-23 ENCOUNTER — Other Ambulatory Visit: Payer: Medicaid Other | Admitting: Obstetrics and Gynecology

## 2023-01-23 NOTE — Patient Instructions (Signed)
Hi Mr. Mackler, thank you for speaking with me-I hope you feel better soon!  Mr. Chann was given information about Medicaid Managed Care team care coordination services as a part of their Healthy [facility] Medicaid benefit. [name] verbally consented to engagement with the [facility] Managed Care team.   If you are experiencing a medical emergency, please call 911 or report to your local emergency department or urgent care.   If you have a non-emergency medical problem during routine business hours, please contact your provider's office and ask to speak with a nurse.   For questions related to your Healthy [facility] health plan, please call: [phone] or visit the homepage here: [email or web address]  If you would like to schedule transportation through your Healthy [facility] plan, please call the following number at least 2 days in advance of your appointment: [phone]  For information about your ride after you set it up, call Ride Assist at [phone]. Use this number to activate a Will Call pickup, or if your transportation is late for a scheduled pickup. Use this number, too, if you need to make a change or cancel a previously scheduled reservation.  If you need transportation services right away, call [phone]. The after-hours call center is staffed 24 hours to handle ride assistance and urgent reservation requests (including discharges) 365 days a year. Urgent trips include sick visits, hospital discharge requests and life-sustaining treatment.  Call the [facility] Line at [phone], at any time, 24 hours a day, 7 days a week. If you are in danger or need immediate medical attention call 911.  If you would like help to quit smoking, call 1-800-QUIT-NOW (334 264 7462) OR Espaol: 1-855-Djelo-Ya (0-932-355-7322) o para ms informacin haga clic aqu or Text READY to 025-427 to register via text  Mr. Dada - following are  the goals we discussed in your visit today:   Goals Addressed             This Visit's Progress    RNCM - Chronic Back Pain Management       Timeframe:  Long-Range Goal Priority:  High Start Date:   05/09/2021                          Expected End Date: ongoing  Patient Goals: Patient will self administer medications as prescribed Patient will attend all scheduled provider appointments Patient will call pharmacy for medication refills Patient will continue to perform ADL's independently Patient will continue to perform IADL's independently Patient will call provider office for new concerns or questions Patient will work with BSW to address care coordination needs and will continue to work with the clinical team to address health care and disease management related needs.   Patient's pain will be managed to a tolerable pain level where patient can be functional with ADL's and IADL's.    01/23/23  followed by pain mgt-Qutenza     RNCM - CML Disease Progression Minimized or Managed       Timeframe:  Long-Range Goal Priority:  High Start Date:     05/09/2021                       Expected End Date: ongoing  Patient Goals: Patient will self administer medications as prescribed Patient will attend all scheduled provider appointments Patient will call pharmacy for medication refills Patient will continue to perform ADL's independently Patient will continue to perform IADL's independently Patient will  call provider office for new concerns or questions Patient will work with BSW to address care coordination needs and will continue to work with the clinical team to address health care and disease management related needs.  01/23/23:  Followed by ONC-in remission                        RNCM - Glycemic Management & Monitored       Timeframe:  Long-Range Goal Priority:  High Start Date:     05/09/2021                        Expected End Date:  ongoing  Patient Goals: Patient will self  administer medications as prescribed Patient will attend all scheduled provider appointments Patient will call pharmacy for medication refills Patient will continue to perform ADL's independently Patient will continue to perform IADL's independently Patient will call provider office for new concerns or questions Patient will work with BSW to address care coordination needs and will continue to work with the clinical team to address health care and disease management related needs.      Patient monitor blood sugars twice a day at varies times using Accu-check guide and record results. A1C level will remain at 6.0 or below.    01/23/23: not checking blood sugar     RNCM - Hypertension Monitored and Managed       Timeframe:  Long-Range Goal Priority:  High Start Date:  05/09/2021                           Expected End Date:  ongoing  Patient Goals Patient will self administer medications as prescribed Patient will attend all scheduled provider appointments Patient will call pharmacy for medication refills Patient will continue to perform ADL's independently Patient will continue to perform IADL's independently Patient will call provider office for new concerns or questions Patient will work with BSW to address care coordination needs and will continue to work with the clinical team to address health care and disease management related needs.    Patient will check blood pressure with new blood pressure monitor at least once a week and record readings.          01/23/23: Not checking blood pressure   Patient verbalizes understanding of instructions and care plan provided today and agrees to view in MyChart. Active MyChart status and patient understanding of how to access instructions and care plan via MyChart confirmed with patient.     The Managed Medicaid care management team will reach out to the patient again over the next 60 business  days.  The  Patient  has been provided with contact  information for the Managed Medicaid care management team and has been advised to call with any health related questions or concerns.   Kathi Der RN, BSN [place]  [facility] Care Management Coordinator - Managed Medicaid High Risk [phone]   Following is a copy of your plan of care:  Care Plan : RNCM Plan of Care  Updates made by Danie Chandler, RN since 01/23/2023 12:00 AM     Problem: Chronic Disease Management and Care Coordination Needs for DM, HTN, CML and Back Pain      Long-Range Goal: Plan of Care for Chronic Disease management and Care Coordination Needs (HTN, DM, CML, Chronic Back Pain)   Start Date: 05/09/2021  Expected End Date:  04/25/2023  Priority: High  Note:   Current Barriers:  Knowledge Deficits related to plan of care for management of HTN, DMII, and CML and Chronic Back Pain  Chronic Disease Management support and education needs related to HTN, DMII, and CML and Chronic Back Pain 01/23/23:  Patient not feeling well, increased pain neck, back.  Dog died 12-25-2022.  Has depression-no longer in therapy.  Does not check BP or BG.  Has court soon for being behind on property taxes for 2 years.  Out of water again.  RNCM Clinical Goal(s):  Patient will verbalize understanding of plan for management of HTN, DMII, and CML and Chronic Back Pain verbalize basic understanding of HTN, DMII, and CML and Chronic Back Pain disease process and self health management plan  take all medications exactly as prescribed and will call provider for medication related questions attend all scheduled medical appointments demonstrate ongoing adherence to prescribed treatment plan for HTN, DMII, and CML and Chronic Back Pain as evidenced by daily monitoring and recording of CBG, adherence to ADA/ carb modified diet, adherence to prescribed medication regimen, contacting provider for new or worsened symptoms or questions  demonstrate ongoing health management independence   continue to work with RN Care Manager to address care management and care coordination needs related to HTN, DMII, and CML and Chronic Back Pain  work with pharmacist to address complex medication regimen  related to HTN, DMII, and CML and Chronic Back Pain work with Child psychotherapist to address financial constraints related to overdue payment of back taxes and potential risk of losing home, Limited social support and counseling for anxiety/stress related to the management of HTN, DMII, and CML and Chronic Back Pain. collaborate with the care management team towards completion of advanced directives  through collaboration with RN Care manager, provider, and care team.   Interventions: Inter-disciplinary care team collaboration (see longitudinal plan of care) Evaluation of current treatment plan related to  self management and patient's adherence to plan as established by provider Collaboration with PCP office for Tramadol refill at patient request. BSW referral for resources-completed. Collaborated with BSW LCSW referral for depression/trauma Collaborated with LCSW To schedule  follow up appts with providers.  Diabetes:  (Status: Goal on Track (progressing): YES.) Lab Results  Component Value Date   HGBA1C 6.1 (A) 01/16/2022                                                                            6.6                                    04/04/22                                                                           7.3  08/09/22                                                                           6.4                                   11/08/22 Assessed patient's understanding of A1c goal: <6.5% Continue to provided education to patient about basic DM disease process; Reviewed medications with patient and discussed importance of medication adherence. Counseled on importance of regular laboratory monitoring as prescribed;        Discussed plans with  patient for ongoing care management follow up and provided patient with direct contact information for care management team;      Reviewed scheduled/upcoming provider appointments including: See RNCM Clinical Goals above.       Call provider for findings outside established parameters.      Referral made to pharmacy team previously for assistance with complex medication regimen.  Patient doing well with medication compliance and his understanding of all medications.  Pharmacist referral not needed at this time.       Review of patient status, including review of consultants reports, relevant laboratory and other test results, and medications completed.  Patient understands the importance of drinking low sugar drinks and use of sugar substitutes.       Chronic Myeloid Leukemia (CML)  (Status: Goal on Track (progressing): YES.) Evaluation of current treatment plan related to  CML and patient's adherence to plan as established by provider.  Patient continues to be in remission and the plan is for patient to continue on Gleevec 400 mg daily.  Patient continues to take the Gleevec with some protein to avoid having diarrhea.  Discussed plans with patient for ongoing care management follow up and provided patient with direct contact information for care management team Reviewed medications with patient and discussed the importance of taking all medications as prescribed; Pharmacy referral for complex medication regimen made previously.  [facility] Pharmacist not needed at this time. 07/13/22: ONC appt 07/04/22-WNL  Hypertension: (Status: Goal on Track (progressing): YES.) Last practice recorded Blood Pressure readings: BP Readings from Last 3 Encounters:  01/16/22 115/80  12/26/21 112/76  09/22/21 (!) 144/90   06/13/22:  BP 145/87   07/30/22:   BP 120/70  10/18/22:  BP  136/86 12/05/22:   BP  140/82 Most recent eGFR/CrCl: No results found for: EGFR  No components found for: CRCL  Evaluation of current  treatment plan related to hypertension self management and patient's adherence to plan as established by provider;   Reviewed medications with patient and discussed importance of compliance.  Discussed plans with patient for ongoing care management follow up and provided patient with direct contact information for care management team; Discussed patient's recent blood pressure readings at recent medical office visits.  Reviewed scheduled/upcoming provider appointments i Patient did obtain a blood pressure monitor through [facility].   Continued to re-enforce education on Orthostatic blood pressure changes that occur normally when changing positions from lying to sitting to standing.  Patient continues to report he does experience some lightheaded with moving from a sitting to a standing position which  only last a few seconds.  Patient checks his blood pressure and heart rate at time of episode. Patient suspects caffeine may be making heart rate elevated. Instructed patient to change positions slowly and continue to monitor. Patient verbalized understanding.  Pain:  (Status: Goal on Track (progressing): YES.) Pain assessment performed.  Patient reports chronic neck & back pain. Pain rating is on a Pain Scale of 0 - 10. Currently using Ibuprofen 600 mg PRN during the day and Tramadol at night with fair relief - able to sleep only 2 hours at a stretch.  Pain interferes with sleep due to staying in same position for 2 hours.  Per patient, his pain is either triggered by staying in same position for a prolonged period of time or by moving too much. Medications reviewed.  Reviewed provider established plan for pain management; Discussed importance of adherence to all scheduled medical appointments; Patient continues to report having Neuropathy on his left side of his body (arm and leg) causing numbness and lack of coordination with movement.    Will continue to monitor.    Pain better with Qutenza  patch  Patient Goals/Self-Care Activities: Patient will self administer medications as prescribed as evidenced by self report/primary caregiver report  Patient will attend all scheduled provider appointments as evidenced by clinician review of documented attendance to scheduled appointments and patient/caregiver report Patient will call pharmacy for medication refills as evidenced by patient report and review of pharmacy fill history as appropriate Patient will continue to perform ADL's independently as evidenced by patient/caregiver report Patient will continue to perform IADL's independently as evidenced by patient/caregiver report Patient will call provider office for new concerns or questions as evidenced by review of documented incoming telephone call notes and patient report Patient will work with BSW to address care coordination needs and will continue to work with the clinical team to address health care and disease management related needs as evidenced by documented adherence to scheduled care management/care coordination appointments - check blood sugar at prescribed times: twice daily - enter blood sugar readings and medication or insulin into daily log - take the blood sugar log to all doctor visits - check blood pressure weekly - write blood pressure results in a log or diary - keep a blood pressure log - take blood pressure log to all doctor appointments - call doctor for signs and symptoms of high blood pressure - keep all doctor appointments - take medications for blood pressure exactly as prescribed

## 2023-01-23 NOTE — Patient Outreach (Signed)
Medicaid Managed Care   Nurse Care Manager Note  01/23/2023 Name:  Gerald Olson MRN:  [id number] DOB:  Jul 12, 1966  Gerald Olson is an 57 y.o. year old male who is a primary patient of Ok Edwards, Lillia Abed, [place].  The [facility] Managed Care Coordination team was consulted for assistance with:    Chronic healthcare management needs, HTN, DM, chronic pain, anxiety/depression/ADHD, h/o leukemia, cervical/lumbar spondylosis  [name] was given information about Medicaid Managed Care Coordination team services today. Gerald Olson Patient agreed to services and verbal consent obtained.  Engaged with patient by telephone for follow up visit in response to provider referral for case management and/or care coordination services.   Assessments/Interventions:  Review of past medical history, allergies, medications, health status, including review of consultants reports, laboratory and other test data, was performed as part of comprehensive evaluation and provision of chronic care management services.  SDOH (Social Determinants of Health) assessments and interventions performed: SDOH Interventions    Flowsheet Row Patient Outreach Telephone from 01/23/2023 in [place] POPULATION HEALTH DEPARTMENT Patient Outreach Telephone from 12/05/2022 in [place] POPULATION HEALTH DEPARTMENT Patient Outreach Telephone from 10/18/2022 in [place] POPULATION HEALTH DEPARTMENT Patient Outreach Telephone from 09/19/2022 in [place] POPULATION HEALTH DEPARTMENT Patient Outreach Telephone from 08/15/2022 in [place] POPULATION HEALTH DEPARTMENT Patient Outreach Telephone from 07/13/2022 in [place] POPULATION HEALTH DEPARTMENT  SDOH Interventions        Food Insecurity Interventions -- -- Other (Comment)  [patient given resources] -- -- --  Housing Interventions -- -- Other (Comment)  [patient provided with resources] -- -- --  Transportation Interventions -- -- -- -- Intervention Not Indicated --  Utilities  Interventions -- Intervention Not Indicated -- -- -- --  Alcohol Usage Interventions Alcohol Education/Brief Advice  [education provided by providers] -- -- -- -- Alohol Education/Brief Advice  [providers have counseled patient regarding alcohol use]  Financial Strain Interventions -- -- -- Other (Comment)  [BSW spoke with patient today and provided resources] -- --  Physical Activity Interventions -- -- -- -- Intervention Not Indicated  [unable to engage in strenuous to moderate exercise] --  Stress Interventions -- Other (Comment)  [patient has been offered resources] -- -- -- --  Social Connections Interventions -- -- -- Intervention Not Indicated -- --     Care Plan  No Known Allergies  Medications Reviewed Today     Reviewed by Danie Chandler, RN (Registered Nurse) on 01/23/23 at 1529  Med List Status: <None>   Medication Order Taking? Sig Documenting Provider Last Dose Status Informant  Blood Pressure Monitoring (BLOOD PRESSURE MONITOR AUTOMAT) DEVI [id number] No Check blood pressure in AM and PM daily Drubel, Lillia Abed, PA-C Taking Active   dapagliflozin propanediol (FARXIGA) 5 MG TABS tablet [id number] No Take 1 tablet (5 mg total) by mouth daily before breakfast. Carlus Pavlov, MD Taking Active   diclofenac (VOLTAREN) 50 MG EC tablet [id number]  Take 1 tablet (50 mg total) by mouth 2 (two) times daily. Fanny Dance, MD  Active   Patient not taking:  Discontinued 08/09/20 1309 (Patient Preference)   glucose blood (ACCU-CHEK GUIDE) test strip [id number] No USE AS DIRECTED 2 TIMES DAILY Carlus Pavlov, MD Taking Active   imatinib (GLEEVEC) 400 MG tablet [id number] No Take 1 tablet (400 mg total) by mouth daily. Jeralyn Ruths, MD Taking Active   insulin glargine (LANTUS SOLOSTAR) 100 UNIT/ML Solostar Pen [id number]  Inject 7 Units into the skin every morning. Carlus Pavlov, MD  Active   Insulin Pen Needle (TECHLITE PEN NEEDLES) 32G X 4 MM MISC [id number]  Use to inject  Victoza once a day Carlus Pavlov, MD  Active   lisinopril (ZESTRIL) 20 MG tablet [id number] No Take 1 tablet (20 mg total) by mouth daily. Alfredia Ferguson, PA-C Taking Active   pregabalin (LYRICA) 100 MG capsule [id number]  Take 1 capsule (100 mg total) by mouth 2 (two) times daily. 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 08/09/2020   Mesenteric lymphadenopathy 06/14/2020   Splenomegaly- mild  06/14/2020   History of cannabis abuse 06/14/2020   Recurrent major depressive disorder, in partial remission (HCC) 06/09/2020   Anxiety 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 06/09/2020   Alcohol use disorder, moderate, dependence (HCC) 06/09/2020   Frequent falls 05/23/2020   Foraminal stenosis of cervical region 05/09/2020   Diabetic polyneuropathy    ADHD (attention deficit hyperactivity disorder)    Generalized anxiety disorder    Major depressive disorder    Cervical spondylosis 09/22/2019   Essential hypertension, benign 01/28/2019   Alcoholic cirrhosis of liver 06/02/2018   Type 2 diabetes mellitus with hyperglycemia 04/25/2018   Conditions to be addressed/monitored per PCP order:  Chronic healthcare management needs, HTN, DM, chronic pain, anxiety/depression/ADHD, h/o leukemia, cervical/lumbar spondylosis  Care Plan : RNCM Plan of Care  Updates made by Danie Chandler, RN since 01/23/2023 12:00 AM     Problem: Chronic Disease Management and Care Coordination Needs for DM, HTN, CML and Back Pain       Long-Range Goal: Plan of Care for Chronic Disease management and Care Coordination Needs (HTN, DM, CML, Chronic Back Pain)   Start Date: 05/09/2021  Expected End Date: 04/25/2023  Priority: High  Note:   Current Barriers:  Knowledge Deficits related to plan of care for management of HTN, DMII, and CML and Chronic Back Pain  Chronic Disease Management support and education needs related to HTN, DMII, and CML and Chronic Back Pain 01/23/23:  Patient not feeling well, increased pain neck, back.  Dog died 31-Dec-2022.  Has depression-no longer in therapy.  Does not check BP or BG.  Has court soon for being behind on property taxes for 2 years.  Out of water again.  RNCM Clinical Goal(s):  Patient will verbalize understanding of plan for management of HTN, DMII, and CML and Chronic Back Pain verbalize basic understanding of HTN, DMII, and CML and Chronic Back Pain disease process and self health management plan  take all medications exactly as prescribed and will call provider for medication related questions attend all scheduled medical appointments demonstrate ongoing adherence to prescribed treatment plan for HTN, DMII, and CML and Chronic Back Pain as evidenced by daily monitoring and recording of CBG, adherence to ADA/ carb modified diet, adherence to prescribed medication regimen, contacting provider for new or worsened symptoms or questions  demonstrate ongoing health management independence  continue to work with RN Care Manager to address care management and care coordination needs related to HTN, DMII,  and CML and Chronic Back Pain  work with pharmacist to address complex medication regimen  related to HTN, DMII, and CML and Chronic Back Pain work with Child psychotherapist to address financial constraints related to overdue payment of back taxes and potential risk of losing home, Limited social support and counseling for anxiety/stress related to the management of HTN, DMII, and CML and Chronic Back  Pain. collaborate with the care management team towards completion of advanced directives  through collaboration with RN Care manager, provider, and care team.   Interventions: Inter-disciplinary care team collaboration (see longitudinal plan of care) Evaluation of current treatment plan related to  self management and patient's adherence to plan as established by provider Collaboration with PCP office for Tramadol refill at patient request. BSW referral for resources-completed. Collaborated with BSW LCSW referral for depression/trauma Collaborated with LCSW To schedule  follow up appts with providers.  Diabetes:  (Status: Goal on Track (progressing): YES.) Lab Results  Component Value Date   HGBA1C 6.1 (A) 01/16/2022                                                                            6.6                                    04/04/22                                                                           7.3                                     08/09/22                                                                           6.4                                   11/08/22 Assessed patient's understanding of A1c goal: <6.5% Continue to provided education to patient about basic DM disease process; Reviewed medications with patient and discussed importance of medication adherence. Counseled on importance of regular laboratory monitoring as prescribed;        Discussed plans with patient for ongoing care management follow up and provided patient with direct contact information for care management team;      Reviewed scheduled/upcoming provider appointments including: See RNCM Clinical Goals above.       Call provider for findings outside established  parameters.      Referral made to pharmacy team previously for assistance with complex medication regimen.  Patient doing well with medication compliance and his understanding of all medications.  Pharmacist referral not needed at this time.        Review of patient status, including review of consultants reports, relevant laboratory and other test results, and medications completed.  Patient understands the importance of drinking low sugar drinks and use of sugar substitutes.       Chronic Myeloid Leukemia (CML)  (Status: Goal on Track (progressing): YES.) Evaluation of current treatment plan related to  CML and patient's adherence to plan as established by provider.  Patient continues to be in remission and the plan is for patient to continue on Gleevec 400 mg daily.  Patient continues to take the Gleevec with some protein to avoid having diarrhea.  Discussed plans with patient for ongoing care management follow up and provided patient with direct contact information for care management team Reviewed medications with patient and discussed the importance of taking all medications as prescribed; Pharmacy referral for complex medication regimen made previously.  [facility] Pharmacist not needed at this time. 07/13/22: ONC appt 07/04/22-WNL  Hypertension: (Status: Goal on Track (progressing): YES.) Last practice recorded Blood Pressure readings: BP Readings from Last 3 Encounters:  01/16/22 115/80  12/26/21 112/76  09/22/21 (!) 144/90   06/13/22:  BP 145/87   07/30/22:   BP 120/70  10/18/22:  BP  136/86 12/05/22:   BP  140/82 Most recent eGFR/CrCl: No results found for: EGFR  No components found for: CRCL  Evaluation of current treatment plan related to hypertension self management and patient's adherence to plan as established by provider;   Reviewed medications with patient and discussed importance of compliance.  Discussed plans with patient for ongoing care management follow up and provided patient with direct contact information for care management team; Discussed patient's recent blood pressure readings at recent medical office visits.  Reviewed scheduled/upcoming provider appointments i Patient did obtain a blood pressure monitor  through [facility].   Continued to re-enforce education on Orthostatic blood pressure changes that occur normally when changing positions from lying to sitting to standing.  Patient continues to report he does experience some lightheaded with moving from a sitting to a standing position which only last a few seconds.  Patient checks his blood pressure and heart rate at time of episode. Patient suspects caffeine may be making heart rate elevated. Instructed patient to change positions slowly and continue to monitor. Patient verbalized understanding.  Pain:  (Status: Goal on Track (progressing): YES.) Pain assessment performed.  Patient reports chronic neck & back pain. Pain rating is on a Pain Scale of 0 - 10. Currently using Ibuprofen 600 mg PRN during the day and Tramadol at night with fair relief - able to sleep only 2 hours at a stretch.  Pain interferes with sleep due to staying in same position for 2 hours.  Per patient, his pain is either triggered by staying in same position for a prolonged period of time or by moving too much. Medications reviewed.  Reviewed provider established plan for pain management; Discussed importance of adherence to all scheduled medical appointments; Patient continues to report having Neuropathy on his left side of his body (arm and leg) causing numbness and lack of coordination with movement.    Will continue to monitor.    Pain better with Qutenza patch  Patient Goals/Self-Care Activities: Patient will self administer medications as  prescribed as evidenced by self report/primary caregiver report  Patient will attend all scheduled provider appointments as evidenced by clinician review of documented attendance to scheduled appointments and patient/caregiver report Patient will call pharmacy for medication refills as evidenced by patient report and review of pharmacy fill history as appropriate Patient will continue to perform ADL's independently as evidenced by  patient/caregiver report Patient will continue to perform IADL's independently as evidenced by patient/caregiver report Patient will call provider office for new concerns or questions as evidenced by review of documented incoming telephone call notes and patient report Patient will work with BSW to address care coordination needs and will continue to work with the clinical team to address health care and disease management related needs as evidenced by documented adherence to scheduled care management/care coordination appointments - check blood sugar at prescribed times: twice daily - enter blood sugar readings and medication or insulin into daily log - take the blood sugar log to all doctor visits - check blood pressure weekly - write blood pressure results in a log or diary - keep a blood pressure log - take blood pressure log to all doctor appointments - call doctor for signs and symptoms of high blood pressure - keep all doctor appointments - take medications for blood pressure exactly as prescribed   Follow Up:  Patient agrees to Care Plan and Follow-up.  Plan: The Managed Medicaid care management team will reach out to the patient again over the next 60 business  days. and The  Patient has been provided with contact information for the Managed Medicaid care management team and has been advised to call with any health related questions or concerns.  Date/time of next scheduled RN care management/care coordination outreach: 03/25/23 at 315

## 2023-01-24 ENCOUNTER — Other Ambulatory Visit (HOSPITAL_COMMUNITY): Payer: Self-pay

## 2023-01-24 ENCOUNTER — Other Ambulatory Visit: Payer: Self-pay

## 2023-01-29 ENCOUNTER — Other Ambulatory Visit: Payer: Medicaid Other | Admitting: Licensed Clinical Social Worker

## 2023-01-29 NOTE — Patient Instructions (Addendum)
Visit Information  Mr. Lari was given information about Medicaid Managed Care team care coordination services as a part of their Healthy [facility] Medicaid benefit. [name] verbally consented to engagement with the [facility] Managed Care team.   If you are experiencing a medical emergency, please call 911 or report to your local emergency department or urgent care.   If you have a non-emergency medical problem during routine business hours, please contact your provider's office and ask to speak with a nurse.   For questions related to your Healthy [facility] health plan, please call: [phone] or visit the homepage here: [email or web address]  If you would like to schedule transportation through your Healthy [facility] plan, please call the following number at least 2 days in advance of your appointment: [phone]  For information about your ride after you set it up, call Ride Assist at [phone]. Use this number to activate a Will Call pickup, or if your transportation is late for a scheduled pickup. Use this number, too, if you need to make a change or cancel a previously scheduled reservation.  If you need transportation services right away, call [phone]. The after-hours call center is staffed 24 hours to handle ride assistance and urgent reservation requests (including discharges) 365 days a year. Urgent trips include sick visits, hospital discharge requests and life-sustaining treatment.  Call the [facility] Line at [phone], at any time, 24 hours a day, 7 days a week. If you are in danger or need immediate medical attention call 911.  If you would like help to quit smoking, call 1-800-QUIT-NOW (442-528-9241) OR Espaol: 1-855-Djelo-Ya (8-841-660-6301) o para ms informacin haga clic aqu or Text READY to 601-093 to register via text   Following is a copy of your plan of care:  Care Plan : General Social Work  (Adult)  Updates made by Gustavus Bryant, LCSW since 01/29/2023 12:00 AM     Problem: Anxiety Identification (Anxiety)      Long-Range Goal: Anxiety Symptoms Identified   Start Date: 05/23/2021  Priority: High  Note:   Timeframe:  Short Range Goal Priority:  High Start Date:  01/29/23                         Expected End Date: ongoing  Follow up date and time-02/12/23 at 1 pm   Current barriers:   Chronic Mental Health needs related to anxiety, grief and depression Mental Health Concerns  and Social Isolation Needs Support, Education, and Care Coordination in order to meet unmet mental health needs. Clinical Goal(s): demonstrate a reduction in symptoms related to :Anxiety , Depression, Grief, connect with provider for ongoing mental health treatment.  , and increase coping skills, healthy habits, self-management skills, and stress reduction      Patient Goals/Self-Care Activities: Over the next 120 days Call your insurance provider for more information about your Enhanced Benefits      24- Hour Availability:    [facility]  [address] [place], [place] [phone] Crisis [phone]   [facility] [phone]  [place] [facility]  [phone]    [facility]  [phone] (after hours)   Therapeutic Alternative/Mobile Crisis   [phone]   [place] National Suicide Hotline  (206) 400-3068 Len Child[place]) [place] 175   Call [phone] for mental health emergencies   [facility]  [phone]);  [place] and [employer]  [phone]);  [place], [place], [place], [place], [place], [place], [place]    [facility] [facility] for [facility] Residents For 24/7 walk-up access to mental health services for [facility] children (4+), adolescents and adults, please visit the [facility] located at [address] in [place],  [place].  *[facility] also provides comprehensive outpatient behavioral health services in a variety of locations around the [place].  Connect With [place] [address] [place], [place] [address] HelpLine: 2623334482 or 1-(614)205-7797  Get Directions  Find Help 24/7 By Phone Call our 24-hour HelpLine at 364-070-9685 or 806-701-3607 for immediate assistance for mental health and substance abuse issues.  Walk-In Help [place]: [facility] (Ages 4 and Up) [place]: Emergency Dept., [facility] Additional Resources National Hopeline Network: 1-800-SUICIDE The National Suicide Prevention Lifeline: 1-800-273-TALK    New Goal    Check your email and increase that self-care as [facility] [place] [facility] as possible.  Dickie La, BSW, MSW, [employer] Managed Medicaid LCSW [facility]  [facility] [email or web address] Phone: [phone]

## 2023-01-29 NOTE — Patient Outreach (Addendum)
Medicaid Managed Care Social Work Note  01/29/2023 Name:  Gerald Olson MRN:  [id number] DOB:  [birth year]  Gerald Olson is an 57 y.o. year old male who is a primary patient of Ok Edwards, Lillia Abed, [place].  The Medicaid Managed Care Coordination team was consulted for assistance with:  Mental Health Counseling and Resources  Mr. Kenderdine was given information about Medicaid Managed Care Coordination team services today. Gerald Olson Patient agreed to services and verbal consent obtained.  Engaged with patient  for by telephone forinitial visit in response to referral for case management and/or care coordination services.   Assessments/Interventions:  Review of past medical history, allergies, medications, health status, including review of consultants reports, laboratory and other test data, was performed as part of comprehensive evaluation and provision of chronic care management services.  SDOH: (Social Determinant of Health) assessments and interventions performed: SDOH Interventions    Flowsheet Row Patient Outreach Telephone from 01/29/2023 in [place] POPULATION HEALTH DEPARTMENT Patient Outreach Telephone from 01/23/2023 in [place] POPULATION HEALTH DEPARTMENT Patient Outreach Telephone from 12/05/2022 in [place] POPULATION HEALTH DEPARTMENT Patient Outreach Telephone from 10/18/2022 in [place] POPULATION HEALTH DEPARTMENT Patient Outreach Telephone from 09/19/2022 in [place] POPULATION HEALTH DEPARTMENT Patient Outreach Telephone from 08/15/2022 in [place] POPULATION HEALTH DEPARTMENT  SDOH Interventions        Food Insecurity Interventions -- -- -- Other (Comment)  [patient given resources] -- --  Housing Interventions --  Research officer, trade union for [facility] BSW involvement] -- -- Other (Comment)  [patient provided with resources] -- --  Transportation Interventions -- -- -- -- -- Intervention Not Indicated  Utilities Interventions -- -- Intervention Not Indicated -- -- --  Alcohol Usage  Interventions -- Alcohol Education/Brief Advice  [education provided by providers] -- -- -- --  Financial Strain Interventions -- -- -- -- Other (Comment)  [BSW spoke with patient today and provided resources] --  Physical Activity Interventions -- -- -- -- -- Intervention Not Indicated  [unable to engage in strenuous to moderate exercise]  Stress Interventions [employer], Provide Counseling -- Other (Comment)  [patient has been offered resources] -- -- --  Social Connections Interventions -- -- -- -- Intervention Not Indicated --      Advanced Directives Status:  See Care Plan for related entries.  Care Plan                 No Known Allergies  Medications Reviewed Today     Reviewed by Danie Chandler, RN (Registered Nurse) on 01/23/23 at 1529  Med List Status: <None>   Medication Order Taking? Sig Documenting Provider Last Dose Status Informant  Blood Pressure Monitoring (BLOOD PRESSURE MONITOR AUTOMAT) DEVI [id number] No Check blood pressure in AM and PM daily Drubel, Lillia Abed, PA-C Taking Active   dapagliflozin propanediol (FARXIGA) 5 MG TABS tablet [id number] No Take 1 tablet (5 mg total) by mouth daily before breakfast. Carlus Pavlov, MD Taking Active   diclofenac (VOLTAREN) 50 MG EC tablet [id number]  Take 1 tablet (50 mg total) by mouth 2 (two) times daily. Fanny Dance, MD  Active   Patient not taking:  Discontinued 08/09/20 1309 (Patient Preference)   glucose blood (ACCU-CHEK GUIDE) test strip [id number] No USE AS DIRECTED 2 TIMES DAILY Carlus Pavlov, MD Taking Active   imatinib (GLEEVEC) 400 MG tablet [id number] No Take 1 tablet (400 mg total) by mouth daily. Jeralyn Ruths, MD Taking Active   insulin glargine (LANTUS SOLOSTAR) 100 UNIT/ML Solostar Pen  [id number]  Inject 7 Units into the skin every morning. Carlus Pavlov, MD  Active   Insulin Pen Needle (TECHLITE PEN NEEDLES) 32G X 4 MM MISC [id number]  Use to inject Victoza once a day  Carlus Pavlov, MD  Active   lisinopril (ZESTRIL) 20 MG tablet [id number] No Take 1 tablet (20 mg total) by mouth daily. Alfredia Ferguson, PA-C Taking Active   pregabalin (LYRICA) 100 MG capsule [id number]  Take 1 capsule (100 mg total) by mouth 2 (two) times daily. 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 08/09/2020   Mesenteric lymphadenopathy 06/14/2020   Splenomegaly- mild  06/14/2020   History of cannabis abuse 06/14/2020   Recurrent major depressive disorder, in partial remission (HCC) 06/09/2020   Anxiety 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 06/09/2020   Alcohol use disorder, moderate, dependence (HCC) 06/09/2020   Frequent falls 05/23/2020   Foraminal stenosis of cervical region 05/09/2020   Diabetic polyneuropathy    ADHD (attention deficit hyperactivity disorder)    Generalized anxiety disorder    Major depressive disorder    Cervical spondylosis 09/22/2019   Essential hypertension, benign 01/28/2019   Alcoholic cirrhosis of liver 06/02/2018   Type 2 diabetes mellitus with hyperglycemia 04/25/2018    Conditions to be addressed/monitored per PCP order:  Anxiety  Care Plan : General Social Work (Adult)  Updates made by Gustavus Bryant, LCSW since 01/29/2023 12:00 AM     Problem: Anxiety Identification (Anxiety)      Long-Range Goal: Anxiety Symptoms Identified   Start Date: 05/23/2021  Priority: High  Note:   Timeframe:  Short Range Goal Priority:  High Start Date:  01/29/23                          Expected End Date: ongoing  Follow up date and time-02/12/23 at 1 pm   Current barriers:   Chronic Mental Health needs related to anxiety, grief and depression Mental Health Concerns  and Social Isolation Needs Support, Education, and Care Coordination in order to meet unmet mental health needs. Clinical Goal(s): demonstrate a reduction in symptoms related to :Anxiety , Depression, Grief, connect with provider for ongoing mental health treatment.  , and increase coping skills, healthy habits, self-management skills, and stress reduction      Clinical Interventions:  Assessed patient's previous and current treatment, coping skills, support system and barriers to care  Depression screen reviewed  PHQ2/ PHQ9 completed Provided brief CBT  Reviewed mental health medications with patient and discussed compliance:  Quality of sleep assessed & Sleep Hygiene techniques promoted  Suicidal Ideation/Homicidal Ideation assessed: Discussed Health Care Power of [place]  ; Review various resources, discussed options and provided patient information about  Options for mental health treatment based on need and insurance Inter-disciplinary care team collaboration (see longitudinal plan of care) Patient prefers CBT therapy and ONLY counseling instead of medication management. [facility] LCSW has been to [facility] and [facility] and states that he had a bill  from them when he thought Medicaid should have covered his services. [facility] LCSW made referral [facility] and he has agreed to contact them to make a self referral as well. UPDATE- Patient has not contacted [facility] yet and prefers to not pursue mental health treatment at this time as he wishes to focus on physical health and only wants to see a psychologist at this time which is not covered by his Medicaid plan. [facility] LCSW has provided list of mental health resources in case he changes his mind. [facility] LCSW will close case at this  time. 01/29/23- Goal reopened today for ongoing mental health needs. Patient is wanting a therapist that can provide CBT. Patient reports attending past support groups at [facility] and he was encouraged to restart this support as it helped provide him structure during the day as well as an emotional outlet. [facility] LCSW rescheduled patient with [facility] BSW due to ongoing financial/housing issues. Email sent to patient on 01/29/23 with therapist in the area that accept his insurance. [facility] LCSW provided extra emphasis on those that provide CBT. Patient does admit to occasional beer drinking for stress relief. [facility] LCSW did suggest AA support group consideration.  BSW completed telephone outreach with patient, he stated he did not re certify for his foodstamps he is familiar with the food pantries in the area, he stated he could just go to the store and purchase food because the grocery store is closer. Patient states his basement caved in and he is trying to to repair it himself. Patient states he has contacted the housing coalition but they will not help him because his home is under foreclosure BSW provided patient with information with food pantries and healthy blue.  Patient Goals/Self-Care Activities: Over the next 120 days Call your insurance provider for more information about your Enhanced Benefits      24- Hour Availability:    [facility]  [address] [place], [place] [phone] Crisis [phone]   [facility] [phone]  [facility]  [phone]    [facility]  [phone] (after hours)   Therapeutic Alternative/Mobile Crisis   [phone]   [place] National Suicide Hotline  (787) 393-8938 Len Childs) [place] [address]   Call [phone] for mental health emergencies   [facility]  [phone]);  [place] and [employer]  [phone]); [place], [place], [place], [place], [place],  [place], [place]    [place] [facility] Residents For 24/7 walk-up access to mental health services for [facility] children (4+), adolescents and adults, please visit the [facility] located at [address] in [place], [place].  *[place] also provides comprehensive outpatient behavioral health services in a variety of locations around the [place].  Connect With [place] [address] [place], [place] [address] HelpLine: 831-132-9334 or 1-410-170-1421  Get Directions  Find Help 24/7 By Phone Call our 24-hour HelpLine at 908-652-4071 or (713)818-1499 for immediate assistance for mental health and substance abuse issues.  Walk-In Help [place]: [facility] (Ages 4 and Up) [place]: Emergency Dept., [facility] Additional Resources National Hopeline Network: 1-800-SUICIDE The National Suicide Prevention Lifeline: 1-800-273-TALK    New Goal       11/02/2022    3:03 PM 06/15/2022    1:32 PM 05/18/2022    2:43 PM 10/17/2021    1:17 PM  09/20/2021    3:27 PM  Depression screen PHQ 2/9  Decreased Interest 0 3 3  3   Down, Depressed, Hopeless 0 3 3  3   PHQ - 2 Score 0 6 6  6   Altered sleeping   3  3  Tired, decreased energy   3  3  Change in appetite   3  3  Feeling bad or failure about yourself    3  3  Trouble concentrating   2  3  Moving slowly or fidgety/restless   1  0  Suicidal thoughts   0  1  PHQ-9 Score   21  22  Difficult doing work/chores   Somewhat difficult  Not difficult at all     Information is confidential and restricted. Go to Review Flowsheets to unlock data.      05/23/2020   10:00 AM 07/01/2019    4:25 PM 11/28/2018    4:21 PM 10/06/2018    3:01 PM  GAD 7 : Generalized Anxiety Score  Nervous, Anxious, on Edge 2 0 0 3  Control/stop worrying 2 0 0 3  Worry too much - different things 3 0 1 3  Trouble relaxing 2 0 1 3  Restless 1  0 0 2  Easily annoyed or irritable 2 0 0 3  Afraid - awful might happen 1 0 0 3  Total GAD 7 Score 13 0 2 20  Anxiety Difficulty  Not difficult at all      Follow up:  Patient agrees to Care Plan and Follow-up.  Plan: The Managed Medicaid care management team will reach out to the patient again over the next 30 days.  Dickie La, BSW, MSW, [employer] Managed Medicaid LCSW [facility]  [facility] [email or web address] Phone: [phone]

## 2023-01-30 ENCOUNTER — Ambulatory Visit: Payer: Medicaid Other | Admitting: Podiatry

## 2023-02-01 ENCOUNTER — Ambulatory Visit: Payer: Medicaid Other | Admitting: Physical Medicine & Rehabilitation

## 2023-02-01 ENCOUNTER — Other Ambulatory Visit: Payer: Self-pay

## 2023-02-01 DIAGNOSIS — C9210 Chronic myeloid leukemia, BCR/ABL-positive, not having achieved remission: Secondary | ICD-10-CM

## 2023-02-04 ENCOUNTER — Inpatient Hospital Stay: Payer: Medicaid Other

## 2023-02-07 ENCOUNTER — Inpatient Hospital Stay: Payer: Medicaid Other | Admitting: Oncology

## 2023-02-11 ENCOUNTER — Other Ambulatory Visit: Payer: Medicaid Other

## 2023-02-11 NOTE — Patient Outreach (Signed)
Medicaid Managed Care Social Work Note  02/11/2023 Name:  Gerald Olson MRN:  [id number] DOB:  November 27, 1965  Gerald Olson is an 57 y.o. year old male who is a primary patient of Ok Edwards, Lillia Abed, [place].  The [facility] Managed Care Coordination team was consulted for assistance with:   housing   Gerald Olson was given information about Medicaid Managed Care Coordination team services today. Gerald Olson Patient agreed to services and verbal consent obtained.  Engaged with patient  for by telephone forfollow up visit in response to referral for case management and/or care coordination services.   Assessments/Interventions:  Review of past medical history, allergies, medications, health status, including review of consultants reports, laboratory and other test data, was performed as part of comprehensive evaluation and provision of chronic care management services.  SDOH: (Social Determinant of Health) assessments and interventions performed: SDOH Interventions    Flowsheet Row Patient Outreach Telephone from 01/29/2023 in [place] POPULATION HEALTH DEPARTMENT Patient Outreach Telephone from 01/23/2023 in [place] POPULATION HEALTH DEPARTMENT Patient Outreach Telephone from 12/05/2022 in [place] POPULATION HEALTH DEPARTMENT Patient Outreach Telephone from 10/18/2022 in [place] POPULATION HEALTH DEPARTMENT Patient Outreach Telephone from 09/19/2022 in [place] POPULATION HEALTH DEPARTMENT Patient Outreach Telephone from 08/15/2022 in  POPULATION HEALTH DEPARTMENT  SDOH Interventions        Food Insecurity Interventions -- -- -- Other (Comment)  [patient given resources] -- --  Housing Interventions --  Research officer, trade union for [facility] BSW involvement] -- -- Other (Comment)  [patient provided with resources] -- --  Transportation Interventions -- -- -- -- -- Intervention Not Indicated  Utilities Interventions -- -- Intervention Not Indicated -- -- --  Alcohol Usage Interventions -- Alcohol  Education/Brief Advice  [education provided by providers] -- -- -- --  Financial Strain Interventions -- -- -- -- Other (Comment)  [BSW spoke with patient today and provided resources] --  Physical Activity Interventions -- -- -- -- -- Intervention Not Indicated  [unable to engage in strenuous to moderate exercise]  Stress Interventions [employer], Provide Counseling -- Other (Comment)  [patient has been offered resources] -- -- --  Social Connections Interventions -- -- -- -- Intervention Not Indicated --     BSW completed a telephone outreach with patient he states his house was in foreclosure and his neighbor purchased the home and is letting him stay there. Patient does not have to pay rent. Patient states he is a month behind on his energy bill and is facing a cut off. BSW encouraged patient to contact Healthy Blue for assistance and Housing Coalitaion.  Advanced Directives Status:  Not addressed in this encounter.  Care Plan                 No Known Allergies  Medications Reviewed Today     Reviewed by Danie Chandler, RN (Registered Nurse) on 01/23/23 at 1529  Med List Status: <None>   Medication Order Taking? Sig Documenting Provider Last Dose Status Informant  Blood Pressure Monitoring (BLOOD PRESSURE MONITOR AUTOMAT) DEVI [id number] No Check blood pressure in AM and PM daily Drubel, Lillia Abed, PA-C Taking Active   dapagliflozin propanediol (FARXIGA) 5 MG TABS tablet [id number] No Take 1 tablet (5 mg total) by mouth daily before breakfast. Carlus Pavlov, MD Taking Active   diclofenac (VOLTAREN) 50 MG EC tablet [id number]  Take 1 tablet (50 mg total) by mouth 2 (two) times daily. Fanny Dance, MD  Active   Patient not taking:  Discontinued 08/09/20 1309 (Patient Preference)   glucose blood (ACCU-CHEK GUIDE) test strip [id number] No USE AS DIRECTED 2 TIMES DAILY Carlus Pavlov, MD Taking Active   imatinib (GLEEVEC) 400 MG tablet [id number] No Take 1  tablet (400 mg total) by mouth daily. Jeralyn Ruths, MD Taking Active   insulin glargine (LANTUS SOLOSTAR) 100 UNIT/ML Solostar Pen [id number]  Inject 7 Units into the skin every morning. Carlus Pavlov, MD  Active   Insulin Pen Needle (TECHLITE PEN NEEDLES) 32G X 4 MM MISC [id number]  Use to inject Victoza once a day Carlus Pavlov, MD  Active   lisinopril (ZESTRIL) 20 MG tablet [id number] No Take 1 tablet (20 mg total) by mouth daily. Alfredia Ferguson, PA-C Taking Active   pregabalin (LYRICA) 100 MG capsule [id number]  Take 1 capsule (100 mg total) by mouth 2 (two) times daily. 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 08/09/2020   Mesenteric lymphadenopathy 06/14/2020   Splenomegaly- mild  06/14/2020   History of cannabis abuse 06/14/2020   Recurrent major depressive disorder, in partial remission (HCC) 06/09/2020   Anxiety 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 06/09/2020   Alcohol use disorder, moderate, dependence (HCC) 06/09/2020   Frequent falls 05/23/2020   Foraminal stenosis of cervical region 05/09/2020   Diabetic polyneuropathy    ADHD (attention deficit hyperactivity disorder)    Generalized anxiety disorder    Major depressive disorder    Cervical spondylosis 09/22/2019   Essential hypertension, benign 01/28/2019   Alcoholic cirrhosis of liver 06/02/2018   Type 2 diabetes mellitus with hyperglycemia 04/25/2018    Conditions to be addressed/monitored per PCP order:   housing  resources  There are no care plans that you recently modified to display for this patient.   Follow up:  Patient agrees to Care Plan and Follow-up.  Plan: The Managed Medicaid care management team will reach out to the patient again over the next 30 days.  Date/time of next scheduled Social Work care management/care coordination outreach:  03/20/23  Gus Puma, Kenard Gower, [facility] Health  Managed [facility] Social Worker [phone]

## 2023-02-11 NOTE — Patient Instructions (Signed)
Visit Information  Mr. Dennen was given information about Medicaid Managed Care team care coordination services as a part of their Healthy [facility] Medicaid benefit. [name] verbally consented to engagement with the [facility] Managed Care team.   If you are experiencing a medical emergency, please call 911 or report to your local emergency department or urgent care.   If you have a non-emergency medical problem during routine business hours, please contact your provider's office and ask to speak with a nurse.   For questions related to your Healthy [facility] health plan, please call: [phone] or visit the homepage here: [email or web address]  If you would like to schedule transportation through your Healthy [facility] plan, please call the following number at least 2 days in advance of your appointment: [phone]  For information about your ride after you set it up, call Ride Assist at [phone]. Use this number to activate a Will Call pickup, or if your transportation is late for a scheduled pickup. Use this number, too, if you need to make a change or cancel a previously scheduled reservation.  If you need transportation services right away, call [phone]. The after-hours call center is staffed 24 hours to handle ride assistance and urgent reservation requests (including discharges) 365 days a year. Urgent trips include sick visits, hospital discharge requests and life-sustaining treatment.  Call the [facility] Line at [phone], at any time, 24 hours a day, 7 days a week. If you are in danger or need immediate medical attention call 911.  If you would like help to quit smoking, call 1-800-QUIT-NOW (9293574821) OR Espaol: 1-855-Djelo-Ya (4-742-595-6387) o para ms informacin haga clic aqu or Text READY to 564-332 to register via text  Mr. Simich - following are the goals we discussed in your visit today:    Social  Worker will follow up in 30 days.   Gus Puma, Kenard Gower, MHA [facility] Health  Managed Medicaid Social Worker [phone]   Following is a copy of your plan of care:  There are no care plans that you recently modified to display for this patient.

## 2023-02-12 ENCOUNTER — Other Ambulatory Visit: Payer: Medicaid Other | Admitting: Licensed Clinical Social Worker

## 2023-02-12 NOTE — Patient Instructions (Signed)
Visit Information  Gerald Olson was given information about Medicaid Managed Care team care coordination services as a part of their Healthy [facility] Medicaid benefit. Gerald Olson verbally consented to engagement with the [facility] Managed Care team.   If you are experiencing a medical emergency, please call 911 or report to your local emergency department or urgent care.   If you have a non-emergency medical problem during routine business hours, please contact your provider's office and ask to speak with a nurse.   For questions related to your Healthy [facility] health plan, please call: [phone] or visit the homepage here: [email or web address]  If you would like to schedule transportation through your Healthy [facility] plan, please call the following number at least 2 days in advance of your appointment: [phone]  For information about your ride after you set it up, call Ride Assist at [phone]. Use this number to activate a Will Call pickup, or if your transportation is late for a scheduled pickup. Use this number, too, if you need to make a change or cancel a previously scheduled reservation.  If you need transportation services right away, call [phone]. The after-hours call center is staffed 24 hours to handle ride assistance and urgent reservation requests (including discharges) 365 days a year. Urgent trips include sick visits, hospital discharge requests and life-sustaining treatment.  Call the [facility] Line at [phone], at any time, 24 hours a day, 7 days a week. If you are in danger or need immediate medical attention call 911.  If you would like help to quit smoking, call 1-800-QUIT-NOW ((505)489-1577) OR Espaol: 1-855-Djelo-Ya (4-742-595-6387) o para ms informacin haga clic aqu or Text READY to 564-332 to register via text   Following is a copy of your plan of care:  Care Plan : General Social Work  (Adult)  Updates made by Gerald Bryant, LCSW since 02/12/2023 12:00 AM     Problem: Anxiety Identification (Anxiety)      Long-Range Goal: Anxiety Symptoms Identified   Start Date: 05/23/2021  Priority: High  Note:   Timeframe:  Short Range Goal Priority:  High Start Date:  01/29/23                         Expected End Date: ongoing  Follow up date and time- 30 day follow up call for 03/14/23 at 1 pm.  Current barriers:   Chronic Mental Health needs related to anxiety, grief and depression Mental Health Concerns  and Social Isolation  Needs Support, Education, and Care Coordination in order to meet unmet mental health needs. Clinical Goal(s): demonstrate a reduction in symptoms related to :Anxiety , Depression, Grief, connect with provider for ongoing mental health treatment.  , and increase coping skills, healthy habits, self-management skills, and stress reduction      Patient Goals/Self-Care Activities: Over the next 120 days Call your insurance provider for more information about your Enhanced Benefits      24- Hour Availability:    [facility]  [address] [place], [place] [phone] Crisis [phone]   [facility] [phone]  [place] [facility]  [phone]    [facility]  [phone] (after hours)   Therapeutic Alternative/Mobile Crisis   [phone]   [place] National Suicide Hotline  919-782-8257 Len Childs) [place] [address]   Call [phone] for mental health emergencies   [facility]  [phone]);  [place] [employer]  Cardinal ACCESS  [phone]); [place], [place], [place], [place], [place], [place], [place]    [place] [facility] for [facility] Residents For 24/7 walk-up access to mental health services for [facility] children (4+), adolescents and adults, please visit the [facility] located at [address] in [place], [place].  *[place] also provides comprehensive outpatient behavioral health services in a variety of locations around the [place].  Connect With [place] [address] [place], [place] [address] HelpLine: 6826076697 or [phone]  Get Directions  Find Help 24/7 By Phone Call our 24-hour HelpLine at 938-505-4886 or [phone]) 385-2558 for immediate assistance for mental health and substance abuse issues.  Walk-In Help [place]: [facility] (Ages 4 and Up) [place]: Emergency Dept., [facility] Additional Resources National Hopeline Network: 1-800-SUICIDE The National Suicide Prevention Lifeline: 2-440-102-VOZD    New Goal     Gerald Olson, [place], MSW, LCSW Managed Medicaid LCSW [facility]  [facility] [email or web address] Phone: [phone]

## 2023-02-12 NOTE — Patient Outreach (Signed)
Medicaid Managed Care Social Work Note  02/12/2023 Name:  Gerald Olson MRN:  [id number] DOB:  May 08, 1966  Gerald Olson is an 57 y.o. year old male who is a primary patient of Ok Edwards, Lillia Abed, [place].  The Medicaid Managed Care Coordination team was consulted for assistance with:  Mental Health Counseling and Resources  Mr. Achenbach was given information about Medicaid Managed Care Coordination team services today. Gerald Olson Patient agreed to services and verbal consent obtained.  Engaged with patient  for by telephone forfollow up visit in response to referral for case management and/or care coordination services.   Assessments/Interventions:  Review of past medical history, allergies, medications, health status, including review of consultants reports, laboratory and other test data, was performed as part of comprehensive evaluation and provision of chronic care management services.  SDOH: (Social Determinant of Health) assessments and interventions performed: SDOH Interventions    Flowsheet Row Patient Outreach Telephone from 02/12/2023 in [place] POPULATION HEALTH DEPARTMENT Patient Outreach Telephone from 01/29/2023 in [place] POPULATION HEALTH DEPARTMENT Patient Outreach Telephone from 01/23/2023 in [place] POPULATION HEALTH DEPARTMENT Patient Outreach Telephone from 12/05/2022 in [place] POPULATION HEALTH DEPARTMENT Patient Outreach Telephone from 10/18/2022 in [place] POPULATION HEALTH DEPARTMENT Patient Outreach Telephone from 09/19/2022 in [place] POPULATION HEALTH DEPARTMENT  SDOH Interventions        Food Insecurity Interventions -- -- -- -- Other (Comment)  [patient given resources] --  Housing Interventions -- --  Research officer, trade [employer] for [facility] BSW involvement] -- -- Other (Comment)  [patient provided with resources] --  Transportation Interventions Intervention Not Indicated -- -- -- -- --  Utilities Interventions -- -- -- Intervention Not Indicated -- --  Alcohol Usage  Interventions -- -- Alcohol Education/Brief Advice  [education provided by providers] -- -- --  Financial Strain Interventions -- -- -- -- -- Other (Comment)  [BSW spoke with patient today and provided resources]  Stress Interventions Offered [employer], Provide Counseling Offered [employer], Provide Counseling -- Other (Comment)  [patient has been offered resources] -- --  Social Connections Interventions -- -- -- -- -- Intervention Not Indicated       Advanced Directives Status:  Not addressed in this encounter.  Care Plan                 No Known Allergies  Medications Reviewed Today     Reviewed by Danie Chandler, RN (Registered Nurse) on 01/23/23 at 1529  Med List Status: <None>   Medication Order Taking? Sig Documenting Provider Last Dose Status Informant  Blood Pressure Monitoring (BLOOD PRESSURE MONITOR AUTOMAT) DEVI [id number] No Check blood pressure in AM and PM daily Drubel, Lillia Abed, PA-C Taking Active   dapagliflozin propanediol (FARXIGA) 5 MG TABS tablet [id number] No Take 1 tablet (5 mg total) by mouth daily before breakfast. Carlus Pavlov, MD Taking Active   diclofenac (VOLTAREN) 50 MG EC tablet [id number]  Take 1 tablet (50 mg total) by mouth 2 (two) times daily. Fanny Dance, MD  Active   Patient not taking:  Discontinued 08/09/20 1309 (Patient Preference)   glucose blood (ACCU-CHEK GUIDE) test strip [id number] No USE AS DIRECTED 2 TIMES DAILY Carlus Pavlov, MD Taking Active   imatinib (GLEEVEC) 400 MG tablet [id number] No Take 1 tablet (400 mg total) by mouth daily. Jeralyn Ruths, MD Taking Active   insulin glargine (LANTUS SOLOSTAR) 100 UNIT/ML Solostar Pen [id number]  Inject 7 Units into the skin every morning. Carlus Pavlov, MD  Active  Insulin Pen Needle (TECHLITE PEN NEEDLES) 32G X 4 MM MISC [id number]  Use to inject Victoza once a day Carlus Pavlov, MD  Active   lisinopril (ZESTRIL) 20 MG tablet [id number] No  Take 1 tablet (20 mg total) by mouth daily. Alfredia Ferguson, PA-C Taking Active   pregabalin (LYRICA) 100 MG capsule [id number]  Take 1 capsule (100 mg total) by mouth 2 (two) times daily. 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 08/09/2020   Mesenteric lymphadenopathy 06/14/2020   Splenomegaly- mild  06/14/2020   History of cannabis abuse 06/14/2020   Recurrent major depressive disorder, in partial remission (HCC) 06/09/2020   Anxiety 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 06/09/2020   Alcohol use disorder, moderate, dependence (HCC) 06/09/2020   Frequent falls 05/23/2020   Foraminal stenosis of cervical region 05/09/2020   Diabetic polyneuropathy    ADHD (attention deficit hyperactivity disorder)    Generalized anxiety disorder    Major depressive disorder    Cervical spondylosis 09/22/2019   Essential hypertension, benign 01/28/2019   Alcoholic cirrhosis of liver 06/02/2018   Type 2 diabetes mellitus with hyperglycemia 04/25/2018    Conditions to be addressed/monitored per PCP order:  Anxiety and Depression  Care Plan : General Social Work (Adult)  Updates made by Gustavus Bryant, LCSW since 02/12/2023 12:00 AM     Problem: Anxiety Identification (Anxiety)      Long-Range Goal: Anxiety Symptoms Identified   Start Date: 05/23/2021  Priority: High  Note:   Timeframe:  Short Range Goal Priority:  High Start Date:  01/29/23                         Expected End Date: ongoing  Follow up date and  time- 30 day follow up call for 03/14/23 at 1 pm.  Current barriers:   Chronic Mental Health needs related to anxiety, grief and depression Mental Health Concerns  and Social Isolation Needs Support, Education, and Care Coordination in order to meet unmet mental health needs. Clinical Goal(s): demonstrate a reduction in symptoms related to :Anxiety , Depression, Grief, connect with provider for ongoing mental health treatment.  , and increase coping skills, healthy habits, self-management skills, and stress reduction      Clinical Interventions:  Assessed patient's previous and current treatment, coping skills, support system and barriers to care  Depression screen reviewed  PHQ2/ PHQ9 completed Provided brief CBT  Reviewed mental health medications with patient and discussed compliance:  Quality of sleep assessed & Sleep Hygiene techniques promoted  Suicidal Ideation/Homicidal Ideation assessed: Discussed Health Care Power of [place]  ; Review various resources, discussed options and provided patient information about  Options for mental health treatment based on need and insurance Inter-disciplinary care team collaboration (see longitudinal plan of care) Patient prefers CBT therapy and ONLY counseling instead of medication management. [facility] LCSW has been to [facility] and [facility] and states that he had a bill from them when he thought Medicaid should have covered  his services. [facility] LCSW made referral [facility] and he has agreed to contact them to make a self referral as well. UPDATE- Patient has not contacted [facility] yet and prefers to not pursue mental health treatment at this time as he wishes to focus on physical health and only wants to see a psychologist at this time which is not covered by his Medicaid plan. [facility] LCSW has provided list of mental health resources in case he changes his mind. [facility] LCSW will close case at this time. 01/29/23- Goal reopened today  for ongoing mental health needs. Patient is wanting a therapist that can provide CBT. Patient reports attending past support groups at [facility] and he was encouraged to restart this support as it helped provide him structure during the day as well as an emotional outlet. [facility] LCSW rescheduled patient with [facility] BSW due to ongoing financial/housing issues. Email sent to patient on 01/29/23 with therapist in the area that accept his insurance. [facility] LCSW provided extra emphasis on those that provide CBT.  Patient does admit to occasional beer drinking for stress relief. [facility] LCSW did suggest AA support group consideration. UPDATE-  Patient reports that due to his increased stress with his housing situation, he did not have time to review resource list that [facility] LCSW sent him. Patient was encouraged to consider going back to support groups which would at least give him some source of socialization and stable support. Patient shares that he is having a hard time with stress management more than depression management. He reports having a difficult time challenging his negative thinking too. Brief CBT intervention implemented during session facilitated by [facility] LCSW. Resource list sent to patient again today on 02/12/23. BSW completed telephone outreach with patient, he stated he did not re certify for his foodstamps he is familiar with the food pantries in the area, he stated he could just go to the store and purchase food because the grocery store is closer. Patient states his basement caved in and he is trying to to repair it himself. Patient states he has contacted the housing coalition but they will not help him because his home is under foreclosure BSW provided patient with information with food pantries and healthy blue.  Patient Goals/Self-Care Activities: Over the next 120 days Call your insurance provider for more information about your Enhanced Benefits      24- Hour Availability:    [facility]   [address] [place], [place] [phone] Crisis [phone]   [facility] [phone]  [place] Crisis Service  [phone]    [facility]  [phone] (after hours)   Therapeutic Alternative/Mobile Crisis   [phone]   [place] National Suicide Hotline  514-780-6422 Len Childs) [place] [address]   Call 9727950587 for mental health emergencies   [facility]  [phone]);  [place] and [employer]  [phone]); [place], [place], [place], [place], [place], [place], [place]    [place] [facility] for [facility] Residents For 24/7 walk-up access to mental health services for [facility] children (4+), adolescents and adults, please visit the [facility] located at [address] in [place], [place].  *[place] also provides comprehensive outpatient behavioral health services in a variety of locations around the [place].  Connect With [place] [address] [place], [place] [address] HelpLine: 360-529-7344 or [phone]  Get Directions  Find Help 24/7 By  Phone Call our 24-hour HelpLine at 2185833598 or 581-015-1888 for immediate assistance for mental health and substance abuse issues.  Walk-In Help [place]: [place] [facility] (Ages 4 and Up) [place]: Emergency Dept., [facility] Additional Resources National Hopeline Network: 1-800-SUICIDE The National Suicide Prevention Lifeline: 1-800-273-TALK    New Goal       11/02/2022    3:03 PM 06/15/2022    1:32 PM 05/18/2022    2:43 PM 10/17/2021    1:17 PM 09/20/2021    3:27 PM  Depression screen PHQ 2/9  Decreased Interest 0 3 3  3   Down, Depressed, Hopeless 0 3 3  3   PHQ - 2 Score 0 6 6  6   Altered sleeping   3  3  Tired, decreased energy   3  3  Change in appetite   3  3  Feeling bad or failure about yourself    3  3   Trouble concentrating   2  3  Moving slowly or fidgety/restless   1  0  Suicidal thoughts   0  1  PHQ-9 Score   21  22  Difficult doing work/chores   Somewhat difficult  Not difficult at all     Information is confidential and restricted. Go to Review Flowsheets to unlock data.   Follow up:  Patient agrees to Care Plan and Follow-up.  Plan: The Managed Medicaid care management team will reach out to the patient again over the next 30 days.  Dickie La, BSW, MSW, [employer] Managed Medicaid LCSW [facility]  [facility] [email or web address] Phone: [phone]

## 2023-03-01 ENCOUNTER — Other Ambulatory Visit: Payer: Self-pay

## 2023-03-01 ENCOUNTER — Other Ambulatory Visit (HOSPITAL_COMMUNITY): Payer: Self-pay

## 2023-03-01 ENCOUNTER — Other Ambulatory Visit: Payer: Self-pay | Admitting: Oncology

## 2023-03-01 DIAGNOSIS — C9210 Chronic myeloid leukemia, BCR/ABL-positive, not having achieved remission: Secondary | ICD-10-CM

## 2023-03-01 MED ORDER — IMATINIB MESYLATE 400 MG PO TABS
400.0000 mg | ORAL_TABLET | Freq: Every day | ORAL | 5 refills | Status: DC
Start: 2023-03-01 — End: 2023-11-19
  Filled 2023-03-01: qty 30, 30d supply, fill #0
  Filled 2023-03-22: qty 30, 30d supply, fill #1

## 2023-03-01 NOTE — Telephone Encounter (Signed)
BC with Differential/Platelet Order: [id number] Status: Final result     Visible to patient: Yes (seen)     Next appt: 03/05/2023 at 02:30 PM in Oncology ([facility])     Dx: CML (chronic myeloid leukemia) (HCC)   0 Result Notes          Component Ref Range & Units 4 mo ago (10/04/22) 8 mo ago (06/20/22) 11 mo ago (03/26/22) 1 yr ago (12/13/21) 1 yr ago (09/20/21) 1 yr ago (06/15/21) 1 yr ago (03/14/21)  WBC 4.0 - 10.5 K/uL 4.7 5.9 7.0 5.1 6.4 4.5 6.4  RBC 4.22 - 5.81 MIL/uL 4.08 Low  4.42 4.13 Low  4.04 Low  4.31 4.23 3.94 Low   Hemoglobin 13.0 - 17.0 g/dL 40.9 Low  81.1 Low  91.4 Low  11.4 Low  12.2 Low  11.7 Low  11.1 Low   HCT 39.0 - 52.0 % 35.2 Low  38.4 Low  36.4 Low  36.0 Low  37.8 Low  36.2 Low  34.2 Low   MCV 80.0 - 100.0 fL 86.3 86.9 88.1 89.1 87.7 85.6 86.8  MCH 26.0 - 34.0 pg 27.7 27.8 28.3 28.2 28.3 27.7 28.2  MCHC 30.0 - 36.0 g/dL 78.2 95.6 21.3 08.6 57.8 32.3 32.5  RDW 11.5 - 15.5 % 14.9 15.6 High  16.5 High  13.7 17.8 High  16.3 High  15.2  Platelets 150 - 400 K/uL 260 283 255 236 289 258 252  nRBC 0.0 - 0.2 % 0.0 0.0 0.0 0.0 0.0 0.0 0.0  Neutrophils Relative % % 58 61 51 69 64 65 62  Neutro Abs 1.7 - 7.7 K/uL 2.7 3.7 3.6 3.5 4.1 2.9 4.0  Lymphocytes Relative % 24 19 21 19 22 21 25   Lymphs Abs 0.7 - 4.0 K/uL 1.1 1.1 1.5 1.0 1.4 1.0 1.6  Monocytes Relative % 13 13 10 7 9 10 9   Monocytes Absolute 0.1 - 1.0 K/uL 0.6 0.7 0.7 0.4 0.6 0.5 0.6  Eosinophils Relative % 4 6 17 4 4 3 3   Eosinophils Absolute 0.0 - 0.5 K/uL 0.2 0.3 1.2 High  0.2 0.3 0.2 0.2  Basophils Relative % 0 1 1 1 1 1 1   Basophils Absolute 0.0 - 0.1 K/uL 0.0 0.0 0.1 0.0 0.1 0.0 0.0  Immature Granulocytes % 1 0 0 0 0 0 0  Abs Immature Granulocytes 0.00 - 0.07 K/uL 0.03 0.01 CM 0.02 CM 0.02 CM 0.02 CM 0.01 CM 0.01 CM  Comment: Performed at [facility], [address] [place], [place] [address]  Resulting Agency [facility]  [facility]         Specimen Collected: 10/04/22 15:00 Last Resulted: 10/04/22 15:17      Lab Flowsheet      Order Details      View Encounter      Lab and Collection Details      Routing      Result History    View All Conversations on this Encounter      CM=Additional comments      Result Care Coordination   Patient Communication   Add Comments   Seen Back to Top      Other Results from 10/04/2022  Phosphorus Order: [id number] Status: Final result      Visible to patient: Yes (seen)      Next appt: 03/05/2023 at 02:30 PM in Oncology ([facility])  Dx: CML (chronic myeloid leukemia) (HCC)    0 Result Notes            Component Ref Range & Units 4 mo ago (10/04/22) 8 mo ago (06/20/22) 11 mo ago (03/26/22) 1 yr ago (12/13/21) 1 yr ago (09/20/21) 1 yr ago (06/15/21) 1 yr ago (03/14/21)  Phosphorus 2.5 - 4.6 mg/dL 2.8 3.0 CM 3.4 CM 3.2 CM 2.6 CM 3.0 CM 3.7 CM  Comment: Performed at [facility], [address] [place], [place] [address]  Resulting Agency [facility]         Specimen Collected: 10/04/22 15:00 Last Resulted: 10/04/22 16:13      Lab Flowsheet       Order Details       View Encounter       Lab and Collection Details       Routing       Result History     View All Conversations on this Encounter      CM=Additional comments      Result Care Coordination   Patient Communication   Add Comments   Seen Back to Top        Magnesium Order: [id number] Status: Final result      Visible to patient: Yes (seen)      Next appt: 03/05/2023 at 02:30 PM in Oncology ([facility])      Dx: CML (chronic myeloid leukemia) (HCC)    0 Result Notes            Component Ref Range & Units 4 mo ago (10/04/22) 8 mo ago (06/20/22) 11 mo ago (03/26/22) 1 yr ago (12/13/21) 1 yr ago (09/20/21) 1 yr ago (06/15/21) 1 yr ago (03/14/21)   Magnesium 1.7 - 2.4 mg/dL 2.3 2.1 CM 2.4 CM 2.2 CM 2.3 CM 1.9 CM 2.2 CM  Comment: Performed at [facility], [address] [place], [place] [address]  Resulting Agency [facility]         Specimen Collected: 10/04/22 15:00 Last Resulted: 10/04/22 15:37      Lab Flowsheet       Order Details       View Encounter       Lab and Collection Details       Routing       Result History     View All Conversations on this Encounter      CM=Additional comments      Result Care Coordination   Patient Communication   Add Comments   Seen Back to Top         Contains abnormal data Comprehensive metabolic panel Order: [id number] Status: Final result      Visible to patient: Yes (seen)      Next appt: 03/05/2023 at 02:30 PM in Oncology ([facility])      Dx: CML (chronic myeloid leukemia) (HCC)    0 Result Notes      1 HM Topic            Component Ref Range & Units 4 mo ago (10/04/22) 8 mo ago (06/20/22) 11 mo ago (03/26/22) 1 yr ago (12/13/21) 1 yr ago (09/20/21) 1 yr ago (06/15/21) 1 yr ago (03/14/21)  Sodium 135 - 145 mmol/L 137 135 136 134 Low  136 137 133 Low   Potassium 3.5 - 5.1 mmol/L 4.2 4.0 4.2 4.2 3.7 4.2 4.1  Chloride 98 - 111 mmol/L 107 104 102 104 112 High  105 104  CO2 22 - 32 mmol/L 24 24 27 25 19  Low  25 21 Low   Glucose, Bld 70 - 99 mg/dL 409 High  811 High  CM 168 High  CM 164 High  CM 163 High  CM 163 High  CM 173 High  CM  Comment: Glucose reference range applies only to samples taken after fasting for at least 8 hours.  BUN 6 - 20 mg/dL 9 19 14 23  High  18 20 19   Creatinine, Ser 0.61 - 1.24 mg/dL 9.14 7.82 9.56 2.13 0.86 0.94 0.92  Calcium 8.9 - 10.3 mg/dL 8.3 Low  8.1 Low  8.5 Low  8.5 Low  8.3 Low  8.7 Low  8.3 Low   Total Protein 6.5 - 8.1 g/dL 6.3 Low  6.0 Low  6.0 Low  6.4 Low  6.2 Low  6.1 Low  5.2 Low   Albumin 3.5 - 5.0 g/dL 3.4 Low  3.4  Low  3.7 3.7 3.5 3.6 3.2 Low   AST 15 - 41 U/L 54 High  52 High  44 High  60 High  36 60 High  33  ALT 0 - 44 U/L 44 37 30 33 30 33 24  Alkaline Phosphatase 38 - 126 U/L 77 72 70 82 91 76 77  Total Bilirubin 0.3 - 1.2 mg/dL 0.3 0.6 0.7 0.8 0.2 Low  0.8 0.5  GFR, Estimated >60 mL/min >60 >60 CM >60 CM >60 CM >60 CM >60 CM >60 CM  Comment: (NOTE) Calculated using the CKD-EPI Creatinine Equation (2021)  Anion gap 5 - 15 6 7  CM 7 CM 5 CM 5 CM 7 CM 8 CM  Comment: Performed at [facility], [address] [place], [place] [address]  Resulting Agency [facility]         Specimen Collected: 10/04/22 15:00 Last Resulted: 10/04/22 15:37

## 2023-03-04 ENCOUNTER — Other Ambulatory Visit (HOSPITAL_COMMUNITY): Payer: Self-pay

## 2023-03-05 ENCOUNTER — Inpatient Hospital Stay: Payer: Medicaid Other | Attending: Oncology

## 2023-03-05 DIAGNOSIS — M549 Dorsalgia, unspecified: Secondary | ICD-10-CM | POA: Insufficient documentation

## 2023-03-05 DIAGNOSIS — C9220 Atypical chronic myeloid leukemia, BCR/ABL-negative, not having achieved remission: Secondary | ICD-10-CM | POA: Insufficient documentation

## 2023-03-05 DIAGNOSIS — G8929 Other chronic pain: Secondary | ICD-10-CM | POA: Insufficient documentation

## 2023-03-05 DIAGNOSIS — M542 Cervicalgia: Secondary | ICD-10-CM | POA: Diagnosis not present

## 2023-03-05 DIAGNOSIS — D649 Anemia, unspecified: Secondary | ICD-10-CM | POA: Diagnosis not present

## 2023-03-05 DIAGNOSIS — C9210 Chronic myeloid leukemia, BCR/ABL-positive, not having achieved remission: Secondary | ICD-10-CM

## 2023-03-05 DIAGNOSIS — R161 Splenomegaly, not elsewhere classified: Secondary | ICD-10-CM | POA: Insufficient documentation

## 2023-03-05 LAB — CBC WITH DIFFERENTIAL (CANCER CENTER ONLY)
Abs Immature Granulocytes: 0.02 10*3/uL (ref 0.00–0.07)
Basophils Absolute: 0 10*3/uL (ref 0.0–0.1)
Basophils Relative: 1 %
Eosinophils Absolute: 0.2 10*3/uL (ref 0.0–0.5)
Eosinophils Relative: 3 %
HCT: 36.7 % — ABNORMAL LOW (ref 39.0–52.0)
Hemoglobin: 11.6 g/dL — ABNORMAL LOW (ref 13.0–17.0)
Immature Granulocytes: 0 %
Lymphocytes Relative: 17 %
Lymphs Abs: 1.1 10*3/uL (ref 0.7–4.0)
MCH: 27.8 pg (ref 26.0–34.0)
MCHC: 31.6 g/dL (ref 30.0–36.0)
MCV: 87.8 fL (ref 80.0–100.0)
Monocytes Absolute: 0.6 10*3/uL (ref 0.1–1.0)
Monocytes Relative: 10 %
Neutro Abs: 4.5 10*3/uL (ref 1.7–7.7)
Neutrophils Relative %: 69 %
Platelet Count: 278 10*3/uL (ref 150–400)
RBC: 4.18 MIL/uL — ABNORMAL LOW (ref 4.22–5.81)
RDW: 16 % — ABNORMAL HIGH (ref 11.5–15.5)
WBC Count: 6.5 10*3/uL (ref 4.0–10.5)
nRBC: 0 % (ref 0.0–0.2)

## 2023-03-05 LAB — CMP (CANCER CENTER ONLY)
ALT: 39 U/L (ref 0–44)
AST: 53 U/L — ABNORMAL HIGH (ref 15–41)
Albumin: 3.4 g/dL — ABNORMAL LOW (ref 3.5–5.0)
Alkaline Phosphatase: 57 U/L (ref 38–126)
Anion gap: 8 (ref 5–15)
BUN: 13 mg/dL (ref 6–20)
CO2: 23 mmol/L (ref 22–32)
Calcium: 8.4 mg/dL — ABNORMAL LOW (ref 8.9–10.3)
Chloride: 105 mmol/L (ref 98–111)
Creatinine: 0.82 mg/dL (ref 0.61–1.24)
GFR, Estimated: >60 mL/min (ref >60)
Glucose, Bld: 144 mg/dL — ABNORMAL HIGH (ref 70–99)
Potassium: 4 mmol/L (ref 3.5–5.1)
Sodium: 136 mmol/L (ref 135–145)
Total Bilirubin: 0.5 mg/dL (ref 0.3–1.2)
Total Protein: 6 g/dL — ABNORMAL LOW (ref 6.5–8.1)

## 2023-03-05 LAB — MAGNESIUM: Magnesium: 2.2 mg/dL (ref 1.7–2.4)

## 2023-03-05 LAB — PHOSPHORUS: Phosphorus: 3.2 mg/dL (ref 2.5–4.6)

## 2023-03-10 LAB — BCR-ABL1, CML/ALL, PCR, QUANT
E1A2 Transcript: <0.0032 %
Interpretation (BCRAL):: NEGATIVE
b2a2 transcript: <0.0032 %
b3a2 transcript: <0.0032 %

## 2023-03-11 ENCOUNTER — Encounter: Payer: Medicaid Other | Attending: Physical Medicine & Rehabilitation | Admitting: Physical Medicine & Rehabilitation

## 2023-03-11 ENCOUNTER — Encounter: Payer: Self-pay | Admitting: Physical Medicine & Rehabilitation

## 2023-03-11 VITALS — BP 137/88 | HR 93 | Ht 75.0 in | Wt 232.0 lb

## 2023-03-11 DIAGNOSIS — F1210 Cannabis abuse, uncomplicated: Secondary | ICD-10-CM | POA: Diagnosis present

## 2023-03-11 DIAGNOSIS — R252 Cramp and spasm: Secondary | ICD-10-CM | POA: Diagnosis not present

## 2023-03-11 DIAGNOSIS — E1142 Type 2 diabetes mellitus with diabetic polyneuropathy: Secondary | ICD-10-CM | POA: Diagnosis not present

## 2023-03-11 DIAGNOSIS — Z789 Other specified health status: Secondary | ICD-10-CM | POA: Insufficient documentation

## 2023-03-11 DIAGNOSIS — M47816 Spondylosis without myelopathy or radiculopathy, lumbar region: Secondary | ICD-10-CM | POA: Diagnosis not present

## 2023-03-11 DIAGNOSIS — M47812 Spondylosis without myelopathy or radiculopathy, cervical region: Secondary | ICD-10-CM | POA: Diagnosis not present

## 2023-03-11 MED ORDER — PREGABALIN 100 MG PO CAPS
100.0000 mg | ORAL_CAPSULE | Freq: Two times a day (BID) | ORAL | 6 refills | Status: DC
  Filled 2023-03-11: qty 60, 30d supply, fill #0
  Filled 2023-10-08: qty 60, 30d supply, fill #1

## 2023-03-11 MED ORDER — CYCLOBENZAPRINE HCL 5 MG PO TABS
5.0000 mg | ORAL_TABLET | Freq: Three times a day (TID) | ORAL | 0 refills | Status: DC | PRN
  Filled 2023-03-11: qty 20, 7d supply, fill #0

## 2023-03-11 NOTE — Progress Notes (Unsigned)
Subjective:    Patient ID: Gerald Olson, male    DOB: 1966/06/30, 57 y.o.   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  Patient reports he had back and neck pain for many years with shooting pain down both legs and altered sensation in his feet.  He also developed diabetic polyneuropathy when he feels like this is his greatest area of pain at this time.  He has chronic burning sensations in his feet that is very severe and has not responded well to oral medications.  Reports his lower back pain and sciatica improved after having his lumbar surgery completed however he continues to have neck pain and he then developed worsening pain in his thoracic spine.  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  His back feels stiff throughout the day and impairs his activities. He reports he has failed multiple medications for his  neuropathy including Topamax, gabapentin, amitriptyline, Cymbalta.  He is not sure if he has used Lyrica in the past.  He uses a natural treatments are better for him and was interested in Qutenza.  He has used tramadol with benefit to his lower back pain, taking it mostly at night because he does not want to have any impairment during the day.  He does take ibuprofen for his back pain with some benefit.  Chiropractic care provided mild benefit to his back pain in the past.  He reports physical therapy did not help and worsened his back pain.  Patient does take occasional cannabis extract for his pain.  He also drinks alcohol 2-3 drinks regularly.   Visit 06/15/22 Gerald Olson is here for follow-up of his chronic pain and completion of Qutenza treatment.  He says he researched Qutenza and has no questions about the procedure.  He continues to have  neck and back pain worse around his thoracic spine felt to be related to DISH.  He has been taking ibuprofen for this pain.  He also uses wild lettuce as a pain treatment.  He has not used tramadol in several months and reports it was not providing much benefit.  He continues to use occasional alcohol and THC products.  Patient reports he has used Voltaren tablets in the past for his back pain and they worked better than ibuprofen.  He reports that he tried Lyrica about 10 years ago for his back pain and it did not provide significant improvement.  He has not tried this for his pain in his feet and is interested in trying this.   Interval History 08/03/2022 Gerald Olson is here for follow-up of his chronic pain.  He reports that Qutenza was beneficial to his pain for several weeks.  He did have some burning about 2 days after application of Qutenza but this has improved.  Burning pain in his feet is returning especially in the mornings and at night.  He uses a CBD balm that helps his foot pain as well.  He also finds that his pain improved with Lyrica 75 mg daily.  He is not having any side effects with this medication.  He has been busy at his house with repair of a water well pump.  He is not able to pick up diclofenac medication due to insurance however he  would like a new prescription for this medication.  Until this time he is using ibuprofen.  He continues to have pain throughout his lower,, mid back and neck.   Interval history 11/02/2022 Patient is here for repeat Qutenza.  Patient reports benefit with prior application of this medicine.  He had some mild discomfort in his feet the day after the procedure, however this resolved with CBD cream.  Lyrica 75 mg continues to provide benefit to his neuropathy pain as well.  He has been using diclofenac intermittently and finds this to be very helpful for his back and neck pain.  He has no questions about the procedure today.   Interval history 03/12/23 Patient  here for follow-up regarding his chronic pain.  He has continued pain in his neck and lower back. Patient stopped using Lyrica for some time but recently restarted it.  Since restarting it he found that it has been helpful.  He does feel like Voltaren is helping his back pain although has been less effective recently.  Patient reports he often starts medicines and I will be helpful for some time.  However eventually he notices a decrease benefit and then switches to different medication.Marland Kitchen  He will often try rotating medications and he often does this with NSAIDs..  Patient reports Qutenza previously did help reduce the pain and tingling in his feet however he delay this treatment until next visit. He currently is having issues with the running water at his house and wants to wait till he gets this fixed. He has cramps in his legs and feels that this has improved after stopping taking lisinopril.  He reports flexeril has helped his cramps previously.    Pain Inventory Average Pain 6 Pain Right Now 9 My pain is sharp, burning, dull, tingling, and aching  In the last 24 hours, has pain interfered with the following? General activity 8 Relation with others 2 Enjoyment of life 10 What TIME of day is your pain at its worst? morning , daytime, evening, and night Sleep (in general) Poor  Pain is worse with: walking, bending, sitting, inactivity, and standing Pain improves with: rest Relief from Meds: 3  Family History  Problem Relation Age of Onset   Aneurysm Father    Drug abuse Sister    Multiple sclerosis Sister    Suicidality Maternal Aunt    Emphysema Maternal Grandfather    Diabetes Neg Hx    Social History   Socioeconomic History   Marital status: Single    Spouse name: Not on file   Number of children: Not on file   Years of education: 44   Highest education level: Master's degree (e.g., MA, MS, MEng, MEd, MSW, MBA)  Occupational History   Not on file  Tobacco Use   Smoking  status: Former    Current packs/day: 0.00    Types: Cigarettes    Quit date: 06/25/2003    Years since quitting: 19.7   Smokeless tobacco: Never   Tobacco comments:    Currently vapes  Vaping Use   Vaping status: Every Day   Substances: CBD  Substance and Sexual Activity   Alcohol use: Yes    Alcohol/week: 14.0 - 21.0 standard drinks of alcohol    Types: 14 - 21 Cans of beer per week    Comment: 2-3 beers per night on average; sometimes up to 6   Drug use: Yes    Types: Marijuana    Comment: 1/week to help sleep; edibles, does  not smoke   Sexual activity: Yes  Other Topics Concern   Not on file  Social History Narrative   Not on file   Social Determinants of Health   Financial Resource Strain: High Risk (09/19/2022)   Overall Financial Resource Strain (CARDIA)    Difficulty of Paying Living Expenses: Very hard  Food Insecurity: Food Insecurity Present (10/18/2022)   Hunger Vital Sign    Worried About Running Out of Food in the Last Year: Sometimes true    Ran Out of Food in the Last Year: Sometimes true  Transportation Needs: No Transportation Needs (02/12/2023)   PRAPARE - Administrator, Civil Service (Medical): No    Lack of Transportation (Non-Medical): No  Physical Activity: Insufficiently Active (08/15/2022)   Exercise Vital Sign    Days of Exercise per Week: 3 days    Minutes of Exercise per Session: 10 min  Stress: Stress Concern Present (02/12/2023)   [employer] of Occupational Health - Occupational Stress Questionnaire    Feeling of Stress : Very much  Social Connections: Socially Isolated (09/19/2022)   Social Connection and Isolation Panel [NHANES]    Frequency of Communication with Friends and Family: More than three times a week    Frequency of Social Gatherings with Friends and Family: More than three times a week    Attends Religious Services: Never    Database administrator or Organizations: No    Attends Banker Meetings:  Never    Marital Status: Never married   Past Surgical History:  Procedure Laterality Date   CHOLECYSTECTOMY N/A 06/02/2018   Procedure: LAPAROSCOPIC CHOLECYSTECTOMY;  Surgeon: Ancil Linsey, MD;  Location: [facility] ORS;  Service: General;  Laterality: N/A;   KNEE ARTHROSCOPY     30 Years ago   lumbar five-sacral one posterior lumbar interbody fusion  01/24/2021   Past Surgical History:  Procedure Laterality Date   CHOLECYSTECTOMY N/A 06/02/2018   Procedure: LAPAROSCOPIC CHOLECYSTECTOMY;  Surgeon: Ancil Linsey, MD;  Location: [facility] ORS;  Service: General;  Laterality: N/A;   KNEE ARTHROSCOPY     30 Years ago   lumbar five-sacral one posterior lumbar interbody fusion  01/24/2021   Past Medical History:  Diagnosis Date   ADHD (attention deficit hyperactivity disorder)    Diagnosed during teenage years   Alcohol use disorder    On average 14-21 drinks per week   Alcoholic cirrhosis of liver    Per patient - condition is not related to moderate alcohol use but rather prolonged NSAID use and recent abuse.   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[place]   Major depressive disorder    Other spondylosis with radiculopathy, cervical region    Retinopathy    Right eye; per patient report   Type 2 diabetes mellitus with hyperglycemia    BP 137/88   Pulse 93   Ht 6\' 3"  (1.905 m)   Wt 232 lb (105.2 kg)   SpO2 97%   BMI 29.00 kg/m   Opioid Risk Score:   Fall Risk  Score:  `1  Depression screen [facility] 2/9     03/11/2023    3:44 PM 11/02/2022    3:03 PM 06/15/2022    1:32 PM 05/18/2022    2:43 PM 10/17/2021    1:17 PM  09/20/2021    3:27 PM 06/20/2021    1:28 PM  Depression screen PHQ 2/9  Decreased Interest 1 0 3 3  3 3   Down, Depressed, Hopeless 1 0 3 3  3 2   PHQ - 2 Score 2 0 6 6  6 5   Altered sleeping    3  3 3   Tired, decreased energy    3  3 3   Change in appetite    3  3 2   Feeling bad or failure about yourself     3  3 1   Trouble concentrating    2  3 1   Moving slowly or fidgety/restless    1  0 0  Suicidal thoughts    0  1 0  PHQ-9 Score    21  22 15   Difficult doing work/chores    Somewhat difficult  Not difficult at all Somewhat difficult     Information is confidential and restricted. Go to Review Flowsheets to unlock data.    Review of Systems  Musculoskeletal:  Positive for back pain and neck pain.       Pain in both knees, pain in left foot & right shoulder  All other systems reviewed and are negative.     Objective:   Physical Exam  Gen: no distress, normal appearing HEENT: oral mucosa pink and moist, NCAT Cardio: Reg rate Chest: normal effort, normal rate of breathing Abd: soft, non-distended Ext: no edema Psych: pleasant, normal affect Skin: warm and dry neuro: Alert , normal speech language, cranial nerves II through XII grossly intact  Sensation decreased to light touch in a stocking glove distribution in bilateral lower extremities below the knee Strength no focal motor deficits musculoskeletal:   Decreased motion at the lumbar and cervical spine in all directions Tenderness throughout paraspinals throughout L spine, T spine and C spine     L spine 01/24/2021 Intraoperative images document instrumented posterolateral and interbody PLIF L5-S1. The L5 pedicle screws are directed towards the superior endplate. Graft markers project in the interspace. Alignment preserved..   IMPRESSION: Limited intraoperative lateral view of the lumbar spine obtained for localization purposes     L spine MRI 11/22/2020 COMPARISON:  MRI lumbar spine July 20, 2018.    FINDINGS: Segmentation:  Standard.   Alignment:  Grade 1 anterolisthesis of L5 on S1, similar to prior.   Vertebrae: Vertebral body heights are maintained. No specific evidence of acute fracture, discitis/osteomyelitis, or suspicious bone lesion. Degenerative/discogenic endplate signal changes about the right eccentric L5-S1 disc.   Conus medullaris and cauda equina: Conus extends to the T12-L1 level. Conus appears normal.   Paraspinal and other soft tissues: Unremarkable.   Disc levels:   T12-L1: Only imaged sagittally. Disc bulging and facet hypertrophy without evidence of significant canal or foraminal stenosis.   L1-L2: Small broad disc bulge with small superimposed left subarticular disc protrusion. Mild facet hypertrophy. No significant canal or foraminal stenosis.   L2-L3: No significant disc protrusion, foraminal stenosis, or canal stenosis.   L3-L4: Similar left foraminal/far lateral disc protrusion which contacts the exiting/exited left L3 nerve root without displacement (series 8, image 20). No significant canal or foraminal stenosis.   L4-L5: Broad disc bulge with superimposed left foraminal disc protrusion  with annular fissure. Mild bilateral subarticular recess stenosis. No significant central canal stenosis. Mild left foraminal stenosis, similar to prior. No significant right foraminal stenosis.   L5-S1: Grade 1 anterolisthesis of L5 on S1. Uncovering of the disc with superimposed disc bulge and bilateral foraminal disc protrusions. Severe bilateral facet arthropathy/hypertrophy. Similar right greater than left subarticular recess stenosis with possible impingement of the descending right S1 nerve root. Similar moderate bilateral foraminal stenosis.   IMPRESSION: 1. Similar grade 1 anterolisthesis of L5 on S1 with right greater than left subarticular recess stenosis, possible impingement of the descending right S1 nerve root, and moderate right greater  than left foraminal stenosis. 2. Similar disc bulge with annular fissure on the left at L4-L5 with mild left foraminal stenosis and mild bilateral subarticular recess stenosis. 3. Similar left foraminal/far lateral disc protrusion at L3-L4 which contacts the exiting/exited left L3 nerve root, unchanged.       C spine MRI 05/29/2020 FINDINGS: Alignment: Straightening of the normal cervical lordosis. One or 2 mm of degenerative anterolisthesis at C7-T1.   Vertebrae: No fracture or primary bone lesion.   Cord: No primary cord lesion.  See below regarding stenosis.   Posterior Fossa, vertebral arteries, paraspinal tissues: Negative   Disc levels:   Foramen magnum is widely patent. No significant finding at the C1-2 level.   C2-3: Endplate osteophytes and bulging of the disc. No compressive central canal stenosis. Mild foraminal narrowing on the right.   C3-4: Endplate osteophytes and bulging of the disc. AP diameter of the canal in the midline 9.3 mm. Bilateral foraminal encroachment right worse than left. Either C4 nerve could be affected.   C4-5: Endplate osteophytes and bulging of the disc. AP diameter of the canal in the midline 7.6 mm. Slight triangulation of the cord without abnormal cord signal. Bilateral bony foraminal stenosis could affect either C5 nerve.   C5-6: Spondylosis with endplate osteophytes and protruding disc material more prominent towards the left. AP diameter of the canal in the midline 6.8 mm. Effacement of the subarachnoid space and some cord deformity particularly on the left. No conclusive abnormal T2 signal within the cord. Bilateral foraminal stenosis could compress either or both C6 nerves.   C6-7: Endplate osteophytes and bulging of the disc. AP diameter of the canal in the midline 8.2 mm. No cord deformity. Bilateral foraminal stenosis left worse than right. Either C7 nerve could be affected, particularly the left.   C7-T1: Bilateral  facet arthropathy with 1 or 2 mm of anterolisthesis. No disc pathology. No compressive canal stenosis. Bilateral foraminal stenosis could affect either C8 nerve. Edematous change of the facet joints could certainly relate to regional pain.   IMPRESSION: C2-3: Mild foraminal narrowing on the right.   C3-4: Spondylosis with bilateral foraminal narrowing right worse than left. Either C4 nerve could be affected.   C4-5: Spondylosis with bilateral foraminal narrowing. Either C5 nerve could be affected. Canal narrowing with AP diameter of 7.6 mm. Slight deformity of the cord without abnormal cord signal.   C5-6: Spondylosis with bilateral foraminal narrowing. Findings more pronounced on the left. Either C6 nerve could be affected. Canal narrowing worse on the left with deformity of the cord. I do not see conclusive T2 signal within the cord. AP diameter of the canal in the midline only 6.8 mm. The patient could be at risk of developing myelopathy at this level.   C6-7: Spondylosis with bilateral foraminal narrowing. Either C7 nerve could be affected. Somewhat worse on the left.  C7-T1: Facet arthropathy with 1 or 2 mm of anterolisthesis and edematous change. Bilateral foraminal stenosis could affect either C8 nerve. The facet arthropathy is likely painful.          Assessment & Plan:   Diabetic polyneuropathy due to diabetes mellitus -Continue Lyrica 100 mg twice daily, advised trying to take this regularly -Scheduled qutenza for b/l feet in about 6 weeks   Back and neck pain.  Imaging has shown lumbar and cervical spondylosis and he is status post PLIF L5-S1.  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  -prior use of tramadol however currently uses THC and alcohol products -Continue Voltaren 50 mg twice daily, discussed not using any additional nsaids while using this medication  Muscle cramps -Discussed using magnesium supplementation- pt reports he is using  this -Will order flexeril 5mg  TID PRN #20 -Advise to discuss lisinopril with PCP

## 2023-03-12 ENCOUNTER — Other Ambulatory Visit (HOSPITAL_COMMUNITY): Payer: Self-pay

## 2023-03-12 ENCOUNTER — Inpatient Hospital Stay (HOSPITAL_BASED_OUTPATIENT_CLINIC_OR_DEPARTMENT_OTHER): Payer: Medicaid Other | Admitting: Oncology

## 2023-03-12 ENCOUNTER — Other Ambulatory Visit: Payer: Self-pay

## 2023-03-12 ENCOUNTER — Encounter: Payer: Self-pay | Admitting: Physical Medicine & Rehabilitation

## 2023-03-12 DIAGNOSIS — R161 Splenomegaly, not elsewhere classified: Secondary | ICD-10-CM

## 2023-03-12 DIAGNOSIS — M542 Cervicalgia: Secondary | ICD-10-CM | POA: Diagnosis not present

## 2023-03-12 DIAGNOSIS — C9210 Chronic myeloid leukemia, BCR/ABL-positive, not having achieved remission: Secondary | ICD-10-CM

## 2023-03-12 DIAGNOSIS — M549 Dorsalgia, unspecified: Secondary | ICD-10-CM

## 2023-03-12 DIAGNOSIS — R590 Localized enlarged lymph nodes: Secondary | ICD-10-CM

## 2023-03-12 NOTE — Progress Notes (Unsigned)
[facility]  [phone] [phone]  ID: Gerald Olson OB: Aug 28, 1965  MR#: [id number]  HKV#:[id number]  Patient Care Team: Alfredia Ferguson, PA-C as PCP - General (Physician Assistant) Shaune Leeks as Social Worker Gustavus Bryant, LCSW as [facility] Care Management (Licensed Clinical Social Worker) Orlie Dakin, Tollie Pizza, MD as Consulting Physician (Oncology) Craft, Calvert Cantor, RN as Case Manager  I connected with Gerald Olson on 03/13/23 at  1:15 PM EDT by video enabled telemedicine visit and verified that I am speaking with the correct person using two identifiers.   I discussed the limitations, risks, security and privacy concerns of performing an evaluation and management service by telemedicine and the availability of in-person appointments. I also discussed with the patient that there may be a patient responsible charge related to this service. The patient expressed understanding and agreed to proceed.   Other persons participating in the visit and their role in the encounter: Patient, MD.  Patient's location: Home. Provider's location: Clinic.   CHIEF COMPLAINT: CML.  INTERVAL HISTORY: Patient agreed to video-assisted telemedicine visit for further evaluation and discussion of his laboratory results.  He currently feels well and is asymptomatic.  He continues to tolerate Gleevec without significant side effects, though he admits he only takes it 5 or 6 days/week. He continues to have chronic back and neck pain.  He has no neurologic complaints. He denies any recent fevers or illnesses. He has a good appetite and denies weight loss. He has no chest pain, shortness of breath, cough, or hemoptysis. He denies any nausea, vomiting, constipation, or diarrhea. He has no melena or hematochezia. He has no urinary complaints.  Patient offers no further specific complaints today.  REVIEW OF SYSTEMS:   Review of Systems  Constitutional:  Negative.  Negative for fever, malaise/fatigue and weight loss.  Respiratory: Negative.  Negative for cough, hemoptysis and shortness of breath.   Cardiovascular: Negative.  Negative for chest pain and leg swelling.  Gastrointestinal: Negative.  Negative for abdominal pain, blood in stool and diarrhea.  Genitourinary: Negative.  Negative for dysuria, flank pain and hematuria.  Musculoskeletal:  Positive for back pain and neck pain.  Skin: Negative.  Negative for rash.  Neurological: Negative.  Negative for dizziness, focal weakness, weakness and headaches.  Psychiatric/Behavioral: Negative.  The patient is not nervous/anxious.     As per HPI. Otherwise, a complete review of systems is negative.  PAST MEDICAL HISTORY: Past Medical History:  Diagnosis Date   ADHD (attention deficit hyperactivity disorder)    Diagnosed during teenage years   Alcohol use disorder    On average 14-21 drinks per week   Alcoholic cirrhosis of liver    Per patient - condition is not related to moderate alcohol use but rather prolonged NSAID use and recent abuse.   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[place]   Major depressive disorder    Other spondylosis with radiculopathy, cervical region    Retinopathy    Right eye; per patient report   Type 2  diabetes mellitus with hyperglycemia     PAST SURGICAL HISTORY: Past Surgical History:  Procedure Laterality Date   CHOLECYSTECTOMY N/A 06/02/2018   Procedure: LAPAROSCOPIC CHOLECYSTECTOMY;  Surgeon: Ancil Linsey, MD;  Location: [facility] ORS;  Service: General;  Laterality: N/A;   KNEE ARTHROSCOPY      30 Years ago   lumbar five-sacral one posterior lumbar interbody fusion  01/24/2021    FAMILY HISTORY: Family History  Problem Relation Age of Onset   Aneurysm Father    Drug abuse Sister    Multiple sclerosis Sister    Suicidality Maternal Aunt    Emphysema Maternal Grandfather    Diabetes Neg Hx     ADVANCED DIRECTIVES (Y/N):  N  HEALTH MAINTENANCE: Social History   Tobacco Use   Smoking status: Former    Current packs/day: 0.00    Types: Cigarettes    Quit date: 06/25/2003    Years since quitting: 19.7   Smokeless tobacco: Never   Tobacco comments:    Currently vapes  Vaping Use   Vaping status: Every Day   Substances: CBD  Substance Use Topics   Alcohol use: Yes    Alcohol/week: 14.0 - 21.0 standard drinks of alcohol    Types: 14 - 21 Cans of beer per week    Comment: 2-3 beers per night on average; sometimes up to 6   Drug use: Yes    Types: Marijuana    Comment: 1/week to help sleep; edibles, does not smoke     Colonoscopy:  PAP:  Bone density:  Lipid panel:  No Known Allergies   Current Outpatient Medications  Medication Sig Dispense Refill   Blood Pressure Monitoring (BLOOD PRESSURE MONITOR AUTOMAT) DEVI Check blood pressure in AM and PM daily 1 each 0   cyclobenzaprine (FLEXERIL) 5 MG tablet Take 1 tablet (5 mg total) by mouth 3 (three) times daily as needed for muscle spasms. 20 tablet 0   dapagliflozin propanediol (FARXIGA) 5 MG TABS tablet Take 1 tablet (5 mg total) by mouth daily before breakfast. 90 tablet 3   diclofenac (VOLTAREN) 50 MG EC tablet Take 1 tablet (50 mg total) by mouth 2 (two) times daily. 60 tablet 1   glucose blood (ACCU-CHEK GUIDE) test strip USE AS DIRECTED 2 TIMES DAILY 180 strip 0   imatinib (GLEEVEC) 400 MG tablet Take 1 tablet (400 mg total) by mouth daily. 30 tablet 5   insulin glargine (LANTUS SOLOSTAR) 100 UNIT/ML Solostar Pen Inject 7 Units into the skin every morning. 15 mL 3   Insulin Pen Needle (TECHLITE PEN  NEEDLES) 32G X 4 MM MISC Use to inject Victoza once a day 100 each 11   lisinopril (ZESTRIL) 20 MG tablet Take 1 tablet (20 mg total) by mouth daily. (Patient not taking: Reported on 03/11/2023) 90 tablet 3   pregabalin (LYRICA) 100 MG capsule Take 1 capsule (100 mg total) by mouth 2 (two) times daily. 60 capsule 6   No current facility-administered medications for this visit.    OBJECTIVE: There were no vitals filed for this visit.    There is no height or weight on file to calculate BMI.    ECOG FS:0 - Asymptomatic  General: Well-developed, well-nourished, no acute distress. HEENT: Normocephalic. Neuro: Alert, answering all questions appropriately. Cranial nerves grossly intact. Psych: Normal affect.  LAB RESULTS:  Lab Results  Component Value Date   NA 136 03/05/2023   K 4.0 03/05/2023   CL 105 03/05/2023   CO2 23 03/05/2023  GLUCOSE 144 (H) 03/05/2023   BUN 13 03/05/2023   CREATININE 0.82 03/05/2023   CALCIUM 8.4 (L) 03/05/2023   PROT 6.0 (L) 03/05/2023   ALBUMIN 3.4 (L) 03/05/2023   AST 53 (H) 03/05/2023   ALT 39 03/05/2023   ALKPHOS 57 03/05/2023   BILITOT 0.5 03/05/2023   GFRNONAA >60 03/05/2023   GFRAA 90 09/14/2020    Lab Results  Component Value Date   WBC 6.5 03/05/2023   NEUTROABS 4.5 03/05/2023   HGB 11.6 (L) 03/05/2023   HCT 36.7 (L) 03/05/2023   MCV 87.8 03/05/2023   PLT 278 03/05/2023     STUDIES: No results found.   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  Repeat CT scan results from July 18, 2020 reviewed independently with only mild abdominal lymphadenopathy and mild splenomegaly that are chronic and unchanged.  Bone marrow biopsy was negative for disease.  Patient was initiated on 400 mg Gleevec in approximately November 2021 and has been in and continues to be in a complete molecular remission since January 2022.   His most recent BCR-ABL test on March 05, 2023 continues to be negative.  Continue Gleevec 400 mg daily.  If patient remains in complete molecular remission for 3 years in November 2024, can consider discontinuing Gleevec.  Return to clinic in 3 months for laboratory work only and then in 6 months with laboratory work and video-assisted telemedicine visit.   Anemia: Chronic and unchanged.  Patient's hemoglobin is 11.6.  Possibly nutritional. Splenomegaly/mesenteric lymphadenopathy: Unlikely related to CML. Back/neck pain: Chronic and unchanged.    I provided 30 minutes of face-to-face video visit time during this encounter which included chart review, counseling, and coordination of care as documented above.   Patient expressed understanding and was in agreement with this plan. He also understands that He can call clinic at any time with any questions, concerns, or complaints.    Jeralyn Ruths, MD   03/13/2023 9:04 AM

## 2023-03-14 ENCOUNTER — Ambulatory Visit: Payer: Medicaid Other | Admitting: Licensed Clinical Social Worker

## 2023-03-18 ENCOUNTER — Other Ambulatory Visit: Payer: Medicaid Other | Admitting: Licensed Clinical Social Worker

## 2023-03-18 NOTE — Patient Instructions (Signed)
Visit Information  Mr. Gains was given information about Medicaid Managed Care team care coordination services as a part of their Healthy [facility] Medicaid benefit. [name] verbally consented to engagement with the [facility] Managed Care team.   If you are experiencing a medical emergency, please call 911 or report to your local emergency department or urgent care.   If you have a non-emergency medical problem during routine business hours, please contact your provider's office and ask to speak with a nurse.   For questions related to your Healthy [facility] health plan, please call: [phone] or visit the homepage here: [email or web address]  If you would like to schedule transportation through your Healthy [facility] plan, please call the following number at least 2 days in advance of your appointment: [phone]  For information about your ride after you set it up, call Ride Assist at [phone]. Use this number to activate a Will Call pickup, or if your transportation is late for a scheduled pickup. Use this number, too, if you need to make a change or cancel a previously scheduled reservation.  If you need transportation services right away, call [phone]. The after-hours call center is staffed 24 hours to handle ride assistance and urgent reservation requests (including discharges) 365 days a year. Urgent trips include sick visits, hospital discharge requests and life-sustaining treatment.  Call the [facility] Line at [phone], at any time, 24 hours a day, 7 days a week. If you are in danger or need immediate medical attention call 911.  If you would like help to quit smoking, call 1-800-QUIT-NOW (347-018-9729) OR Espaol: 1-855-Djelo-Ya (4-742-595-6387) o para ms informacin haga clic aqu or Text READY to 564-332 to register via text  Following is a copy of your plan of care:  Care Plan : General Social Work (Adult)   Updates made by Gustavus Bryant, LCSW since 03/18/2023 12:00 AM     Problem: Anxiety Identification (Anxiety)      Long-Range Goal: Anxiety Symptoms Identified   Start Date: 05/23/2021  Priority: High  Note:   Timeframe:  Short Range Goal Priority:  High Start Date:  01/29/23                         Expected End Date: ongoing  No follow up requested  Current barriers:   Chronic Mental Health needs related to anxiety, grief and depression Mental Health Concerns  and Social Isolation Needs Support, Education, and Care Coordination in order to meet unmet mental health needs. Clinical Goal(s): demonstrate a reduction in symptoms related to :Anxiety , Depression, Grief, connect with provider for ongoing mental health treatment.  , and increase coping skills, healthy habits, self-management skills, and stress reduction      Patient Goals/Self-Care Activities: Over the next 120 days Call your insurance provider for more information about your Enhanced Benefits      24- Hour Availability:    [facility]  [address] [place], [place] [phone] Crisis [phone]   [facility] [phone]  [place] [facility]  [phone]    [facility]  [phone] (after hours)   Therapeutic Alternative/Mobile Crisis   [phone]   [place] National Suicide Hotline  (706) 701-9021 Len Childs) [place] [address]   Call [phone] for mental health emergencies   [facility]  [phone]);  [place] and [employer]  [phone]); [place], [place], [place], [place], [place], [place],  [employer] [facility] for [facility] Residents For 24/7 walk-up access to mental health services for [facility] children (4+), adolescents and adults, please visit the [facility] located at [address] in [place], [place].  *[facility] also  provides comprehensive outpatient behavioral health services in a variety of locations around the [place].  Connect With [place] [address] [place], [place] [address] HelpLine: (781)571-2038 or 1-346-616-4729  Get Directions  Find Help 24/7 By Phone Call our 24-hour HelpLine at (660)803-2149 or 220 720 3177 for immediate assistance for mental health and substance abuse issues.  Walk-In Help [place]: [facility] (Ages 4 and Up) [place]: Emergency Dept., [facility] Additional Resources National Hopeline Network: 1-800-SUICIDE The National Suicide Prevention Lifeline: 1-800-273-TALK     Discharge Goal

## 2023-03-18 NOTE — Patient Outreach (Signed)
Medicaid Managed Care Social Work Note  03/18/2023 Name:  ZYLAN ALMQUIST MRN:  [id number] DOB:  November 28, 1965  [name] is an 57 y.o. year old male who is a primary patient of Ok Edwards, Lillia Abed, [place].  The Medicaid Managed Care Coordination team was consulted for assistance with:  Mental Health Counseling and Resources  Mr. Neidhardt was given information about Medicaid Managed Care Coordination team services today. [name] Patient agreed to services and verbal consent obtained.  Engaged with patient  for by telephone forfollow up visit in response to referral for case management and/or care coordination services.   Assessments/Interventions:  Review of past medical history, allergies, medications, health status, including review of consultants reports, laboratory and other test data, was performed as part of comprehensive evaluation and provision of chronic care management services.  SDOH: (Social Determinant of Health) assessments and interventions performed: SDOH Interventions    Flowsheet Row Patient Outreach Telephone from 03/18/2023 in [place] POPULATION HEALTH DEPARTMENT Patient Outreach Telephone from 02/12/2023 in [place] POPULATION HEALTH DEPARTMENT Patient Outreach Telephone from 01/29/2023 in [place] POPULATION HEALTH DEPARTMENT Patient Outreach Telephone from 01/23/2023 in [place] POPULATION HEALTH DEPARTMENT Patient Outreach Telephone from 12/05/2022 in [place] POPULATION HEALTH DEPARTMENT Patient Outreach Telephone from 10/18/2022 in [place] POPULATION HEALTH DEPARTMENT  SDOH Interventions        Food Insecurity Interventions -- -- -- -- -- Other (Comment)  [patient given resources]  Housing Interventions -- -- --  Research officer, trade union for [facility] BSW involvement] -- -- Other (Comment)  [patient provided with resources]  Transportation Interventions -- Intervention Not Indicated -- -- -- --  Utilities Interventions -- -- -- -- Intervention Not Indicated --  Alcohol Usage  Interventions -- -- -- Alcohol Education/Brief Advice  [education provided by providers] -- --  Stress Interventions Offered [employer], Provide Counseling Offered [employer], Provide Counseling Offered [employer] Resources, Provide Counseling -- Other (Comment)  [patient has been offered resources] --       Advanced Directives Status:  See Care Plan for related entries.  Care Plan                 No Known Allergies  Medications Reviewed Today     Reviewed by Fanny Dance, MD (Physician) on 03/12/23 at 0027  Med List Status: <None>   Medication Order Taking? Sig Documenting Provider Last Dose Status Informant  Blood Pressure Monitoring (BLOOD PRESSURE MONITOR AUTOMAT) DEVI [id number] Yes Check blood pressure in AM and PM daily Drubel, Lillia Abed, PA-C Taking Active   cyclobenzaprine (FLEXERIL) 5 MG tablet [id number] Yes Take 1 tablet (5 mg total) by mouth 3 (three) times daily as needed for muscle spasms. Fanny Dance, MD  Active   dapagliflozin propanediol (FARXIGA) 5 MG TABS tablet [id number] Yes Take 1 tablet (5 mg total) by mouth daily before breakfast. Carlus Pavlov, MD Taking Active   diclofenac (VOLTAREN) 50 MG EC tablet [id number] Yes Take 1 tablet (50 mg total) by mouth 2 (two) times daily. Fanny Dance, MD Taking Active    Patient not taking:   Discontinued 08/09/20 1309 (Patient Preference)   glucose blood (ACCU-CHEK GUIDE) test strip [id number] Yes USE AS DIRECTED 2 TIMES DAILY Carlus Pavlov, MD Taking Active   imatinib (GLEEVEC) 400 MG tablet [id number] Yes Take 1 tablet (400 mg total) by mouth daily. Clent Jacks M, PA-C Taking Active   insulin glargine (LANTUS SOLOSTAR) 100 UNIT/ML Solostar Pen [id number] Yes Inject 7 Units into the skin every morning.  Carlus Pavlov, MD Taking Active   Insulin Pen Needle (TECHLITE PEN NEEDLES) 32G X 4 MM MISC [id number] Yes Use to inject Victoza once a day Carlus Pavlov, MD  Taking Active   lisinopril (ZESTRIL) 20 MG tablet [id number] No Take 1 tablet (20 mg total) by mouth daily.  Patient not taking: Reported on 03/11/2023   Alfredia Ferguson, PA-C Not Taking Active   pregabalin (LYRICA) 100 MG capsule [id number]  Take 1 capsule (100 mg total) by mouth 2 (two) times daily. 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 08/09/2020   Mesenteric lymphadenopathy 06/14/2020   Splenomegaly- mild  06/14/2020   History of cannabis abuse 06/14/2020   Recurrent major depressive disorder, in partial remission (HCC) 06/09/2020   Anxiety 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 06/09/2020   Alcohol use disorder, moderate, dependence (HCC) 06/09/2020   Frequent falls 05/23/2020   Foraminal stenosis of cervical region 05/09/2020   Diabetic polyneuropathy    ADHD (attention deficit hyperactivity disorder)    Generalized anxiety disorder    Major depressive disorder    Cervical spondylosis 09/22/2019   Essential hypertension, benign 01/28/2019   Alcoholic cirrhosis of liver 06/02/2018   Type 2 diabetes mellitus with hyperglycemia 04/25/2018    Conditions to be addressed/monitored per PCP order:  Anxiety  Care Plan : General Social Work (Adult)  Updates made by Gustavus Bryant, LCSW since 03/18/2023 12:00 AM     Problem: Anxiety Identification (Anxiety)      Long-Range Goal: Anxiety Symptoms Identified   Start Date: 05/23/2021  Priority: High  Note:   Timeframe:  Short Range Goal Priority:   High Start Date:  01/29/23                         Expected End Date: ongoing  No follow up requested  Current barriers:   Chronic Mental Health needs related to anxiety, grief and depression Mental Health Concerns  and Social Isolation Needs Support, Education, and Care Coordination in order to meet unmet mental health needs. Clinical Goal(s): demonstrate a reduction in symptoms related to :Anxiety , Depression, Grief, connect with provider for ongoing mental health treatment.  , and increase coping skills, healthy habits, self-management skills, and stress reduction      Clinical Interventions:  Assessed patient's previous and current treatment, coping skills, support system and barriers to care  Depression screen reviewed  PHQ2/ PHQ9 completed Provided brief CBT  Reviewed mental health medications with patient and discussed compliance:  Quality of sleep assessed & Sleep Hygiene techniques promoted  Suicidal Ideation/Homicidal Ideation assessed: Discussed Health Care Power of Alliance  ; Review various resources, discussed options and provided patient information about  Options for mental health treatment based on need and insurance Inter-disciplinary care team collaboration (see longitudinal plan of care) Patient prefers CBT therapy and ONLY counseling instead of medication management. [facility] LCSW has been to [facility] and [facility] and states that he had a bill from them when he thought  Medicaid should have covered his services. [facility] LCSW made referral [facility] and he has agreed to contact them to make a self referral as well. UPDATE- Patient has not contacted [facility] yet and prefers to not pursue mental health treatment at this time as he wishes to focus on physical health and only wants to see a psychologist at this time which is not covered by his Medicaid plan. [facility] LCSW has provided list of mental health resources in case he changes his mind. [name] LCSW will  close case at this time. 01/29/23- Goal reopened today for ongoing mental health needs. Patient is wanting a therapist that can provide CBT. Patient reports attending past support groups at [facility] and he was encouraged to restart this support as it helped provide him structure during the day as well as an emotional outlet. [facility] LCSW rescheduled patient with [facility] BSW due to ongoing financial/housing issues. Email sent to patient on 01/29/23 with therapist in the area that accept his insurance. [facility] LCSW provided extra emphasis on those that provide CBT.  Patient does admit to occasional beer drinking for stress relief. [facility] LCSW did suggest AA support group consideration. UPDATE-  Patient reports that due to his increased stress with his housing situation, he did not have time to review resource list that [facility] LCSW sent him. Patient was encouraged to consider going back to support groups which would at least give him some source of socialization and stable support. Patient shares that he is having a hard time with stress management more than depression management. He reports having a difficult time challenging his negative thinking too. Brief CBT intervention implemented during session facilitated by [facility] LCSW. Resource list sent to patient again today on 02/12/23. Update- [facility] LCSW provided additional education on mental health resources. Patient shares that he prefers not to pursue therapy at this time even though he had a recent friend that had passed away from suicidal. [facility] LCSW expressed her condolences. He shares that he is aware of where to go when/if he wishes to start his mental health treatment. Patient reports that he is setting one goal a day and no matter he ensures that he completes it which has increased his mood and confidence. Positive reinforcement provided. Patient denies wanting a mental health referral completed at this time and denies any further needs. [facility] LCSW will update [facility] team and sign off at this time  but is happy to get back involved at anytime.  BSW completed telephone outreach with patient, he stated he did not re certify for his foodstamps he is familiar with the food pantries in the area, he stated he could just go to the store and purchase food because the grocery store is closer. Patient states his basement caved in and he is trying to to repair it himself. Patient states he has contacted the housing coalition but they will not help him because his home is under foreclosure BSW provided patient with information with food pantries and healthy blue.  Patient Goals/Self-Care Activities: Over the next 120 days Call your insurance provider for more information about your Enhanced Benefits      24- Hour Availability:    [facility]  [address] [place], [place] [phone] Crisis [phone]   [facility] [phone]  [place] [facility]  [phone]    [facility] Crisis Services  [phone] (after hours)   Therapeutic Alternative/Mobile Crisis  [phone]   [place] National Suicide Hotline  830-191-7501 (TALK) OR 988   Call 988 for mental health emergencies   [facility]  [phone]);  [place] and [employer]  [phone]); [place], [place], [place], [place], [place], [place], [place]    [place] for [facility] Residents For 24/7 walk-up access to mental health services for [facility] children (4+), adolescents and adults, please visit the [facility] located at [name] in [place], [place].  *[place] also provides comprehensive outpatient behavioral health services in a variety of locations around the [place].  Connect With [place] [address] [place], [place] [address] HelpLine: 671-719-8011 or 1-(410) [phone]  Get Directions  Find Help 24/7 By Phone Call our 24-hour HelpLine  at 415 032 8740 or 585-537-4142 for immediate assistance for mental health and substance abuse issues.  Walk-In Help [place]: [facility] (Ages 4 and Up) [place]: Emergency Dept., [facility] Additional Resources National Hopeline Network: 1-800-SUICIDE The National Suicide Prevention Lifeline: 1-761-607-PXTG     Discharge Goal     Follow up:  Patient requests no follow-up at this time.  Plan: The Managed Medicaid care management team will reach out to the patient again over the next 30 days. [facility] BSW and RNCM involved)  Dickie La, BSW, MSW, [employer] Managed Medicaid LCSW [place]  [facility] [email or web address] Phone: [phone]

## 2023-03-20 ENCOUNTER — Other Ambulatory Visit (HOSPITAL_COMMUNITY): Payer: Self-pay

## 2023-03-20 ENCOUNTER — Other Ambulatory Visit: Payer: Medicaid Other

## 2023-03-20 NOTE — Patient Outreach (Signed)
Medicaid Managed Care Social Work Note  03/20/2023 Name:  Gerald Olson MRN:  [id number] DOB:  1965/11/15  Gerald Olson is an 57 y.o. year old male who is a primary patient of Ok Edwards, Lillia Abed, [place].  The [facility] Managed Care Coordination team was consulted for assistance with:   housing repairs  Gerald Olson was given information about Medicaid Managed Care Coordination team services today. Gerald Olson Patient agreed to services and verbal consent obtained.  Engaged with patient  for by telephone forfollow up visit in response to referral for case management and/or care coordination services.   Assessments/Interventions:  Review of past medical history, allergies, medications, health status, including review of consultants reports, laboratory and other test data, was performed as part of comprehensive evaluation and provision of chronic care management services.  SDOH: (Social Determinant of Health) assessments and interventions performed: SDOH Interventions    Flowsheet Row Patient Outreach Telephone from 03/18/2023 in [place] POPULATION HEALTH DEPARTMENT Patient Outreach Telephone from 02/12/2023 in [place] POPULATION HEALTH DEPARTMENT Patient Outreach Telephone from 01/29/2023 in [place] POPULATION HEALTH DEPARTMENT Patient Outreach Telephone from 01/23/2023 in [place] POPULATION HEALTH DEPARTMENT Patient Outreach Telephone from 12/05/2022 in [place] POPULATION HEALTH DEPARTMENT Patient Outreach Telephone from 10/18/2022 in [place] POPULATION HEALTH DEPARTMENT  SDOH Interventions        Food Insecurity Interventions -- -- -- -- -- Other (Comment)  [patient given resources]  Housing Interventions -- -- --  Research officer, trade union for [facility] BSW involvement] -- -- Other (Comment)  [patient provided with resources]  Transportation Interventions -- Intervention Not Indicated -- -- -- --  Utilities Interventions -- -- -- -- Intervention Not Indicated --  Alcohol Usage Interventions -- -- --  Alcohol Education/Brief Advice  [education provided by providers] -- --  Stress Interventions Offered [employer], Provide Counseling Offered [employer], Provide Counseling Offered [employer], Provide Counseling -- Other (Comment)  [patient has been offered resources] --     BSW completed a telephone outreach with patient, He states he has been fixing up his home and playing phone tag with [employer] and [employer]. He has reached back out to [employer] but has not been able to speak with anyone yet. Patient states no other resources are needed at this time.  Advanced Directives Status:  Not addressed in this encounter.  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 08/09/2020   Mesenteric lymphadenopathy 06/14/2020   Splenomegaly- mild  06/14/2020   History of cannabis abuse 06/14/2020   Recurrent major depressive disorder, in partial remission (HCC) 06/09/2020   Anxiety 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 06/09/2020   Alcohol use disorder, moderate, dependence (HCC) 06/09/2020   Frequent falls 05/23/2020   Foraminal stenosis of cervical region 05/09/2020   Diabetic polyneuropathy    ADHD (attention deficit hyperactivity disorder)    Generalized anxiety disorder    Major depressive disorder    Cervical spondylosis 09/22/2019   Essential  hypertension, benign 01/28/2019   Alcoholic cirrhosis of liver 06/02/2018   Type 2 diabetes mellitus with hyperglycemia  04/25/2018    Conditions to be addressed/monitored per PCP order:   community resources  There are no care plans that you recently modified to display for this patient.   Follow up:  Patient agrees to Care Plan and Follow-up.  Plan: The Managed Medicaid care management team will reach out to the patient again over the next 60 days.  Date/time of next scheduled Social Work care management/care coordination outreach:  05/21/23  Gus Puma, Kenard Gower, [facility] Health  Managed [facility] Social Worker [phone]

## 2023-03-20 NOTE — Patient Instructions (Signed)
Visit Information  Mr. Beckner was given information about Medicaid Managed Care team care coordination services as a part of their Healthy [facility] Medicaid benefit. [name] verbally consented to engagement with the [facility] Managed Care team.   If you are experiencing a medical emergency, please call 911 or report to your local emergency department or urgent care.   If you have a non-emergency medical problem during routine business hours, please contact your provider's office and ask to speak with a nurse.   For questions related to your Healthy [facility] health plan, please call: [phone] or visit the homepage here: [email or web address]  If you would like to schedule transportation through your Healthy [facility] plan, please call the following number at least 2 days in advance of your appointment: [phone]  For information about your ride after you set it up, call Ride Assist at [phone]. Use this number to activate a Will Call pickup, or if your transportation is late for a scheduled pickup. Use this number, too, if you need to make a change or cancel a previously scheduled reservation.  If you need transportation services right away, call [phone]. The after-hours call center is staffed 24 hours to handle ride assistance and urgent reservation requests (including discharges) 365 days a year. Urgent trips include sick visits, hospital discharge requests and life-sustaining treatment.  Call the [facility] Line at [phone], at any time, 24 hours a day, 7 days a week. If you are in danger or need immediate medical attention call 911.  If you would like help to quit smoking, call 1-800-QUIT-NOW (740 375 0935) OR Espaol: 1-855-Djelo-Ya (5-188-416-6063) o para ms informacin haga clic aqu or Text READY to 016-010 to register via text  Mr. Sachs - following are the goals we discussed in your visit today:   Goals  Addressed   None      Social Worker will follow up in 60 days.   Gus Puma, Kenard Gower, MHA [facility] Health  Managed Medicaid Social Worker [phone]   Following is a copy of your plan of care:  There are no care plans that you recently modified to display for this patient.

## 2023-03-22 ENCOUNTER — Other Ambulatory Visit: Payer: Self-pay

## 2023-03-22 ENCOUNTER — Other Ambulatory Visit: Payer: Self-pay | Admitting: Physical Medicine & Rehabilitation

## 2023-03-22 MED ORDER — DICLOFENAC SODIUM 50 MG PO TBEC
50.0000 mg | DELAYED_RELEASE_TABLET | Freq: Two times a day (BID) | ORAL | 1 refills | Status: DC
  Filled 2023-04-02: qty 60, 30d supply, fill #0

## 2023-03-23 ENCOUNTER — Other Ambulatory Visit (HOSPITAL_COMMUNITY): Payer: Self-pay

## 2023-03-25 ENCOUNTER — Ambulatory Visit: Payer: Medicaid Other | Admitting: Licensed Clinical Social Worker

## 2023-03-25 ENCOUNTER — Other Ambulatory Visit: Payer: Medicaid Other | Admitting: Obstetrics and Gynecology

## 2023-03-25 ENCOUNTER — Encounter: Payer: Self-pay | Admitting: Obstetrics and Gynecology

## 2023-03-25 NOTE — Patient Outreach (Signed)
Medicaid Managed Care   Nurse Care Manager Note  03/25/2023 Name:  Gerald Olson MRN:  [id number] DOB:  06-25-1966  Gerald Olson is an 57 y.o. year old male who is a primary patient of Gerald Olson, Gerald Olson, Gerald Olson.  The [facility] Managed Care Coordination team was consulted for assistance with:    Chronic healthcare management needs, HTN, DM, chronic pain, anxiety/depression/ADHD, h/o leukemia, polyneuropathy, cervical/lumbar spondylosis  Gerald Olson was given information about Medicaid Managed Care Coordination team services today. Gerald Olson Patient agreed to services and verbal consent obtained.  Engaged with patient by telephone for follow up visit in response to provider referral for case management and/or care coordination services.   Assessments/Interventions:  Review of past medical history, allergies, medications, health status, including review of consultants reports, laboratory and other test data, was performed as part of comprehensive evaluation and provision of chronic care management services.  SDOH (Social Determinants of Health) assessments and interventions performed: SDOH Interventions    Flowsheet Row Patient Outreach Telephone from 03/25/2023 in [place] POPULATION HEALTH DEPARTMENT Patient Outreach Telephone from 03/18/2023 in [place] POPULATION HEALTH DEPARTMENT Patient Outreach Telephone from 02/12/2023 in [place] POPULATION HEALTH DEPARTMENT Patient Outreach Telephone from 01/29/2023 in [place] POPULATION HEALTH DEPARTMENT Patient Outreach Telephone from 01/23/2023 in [place] POPULATION HEALTH DEPARTMENT Patient Outreach Telephone from 12/05/2022 in [place] POPULATION HEALTH DEPARTMENT  SDOH Interventions        Housing Interventions -- -- -- --  Research officer, trade union for [facility] BSW involvement] -- --  Transportation Interventions -- -- Intervention Not Indicated -- -- --  Utilities Interventions -- -- -- -- -- Intervention Not Indicated  Alcohol Usage Interventions -- -- --  -- Alcohol Education/Brief Advice  [education provided by providers] --  Physical Activity Interventions Intervention Not Indicated  [spondylosis-unable to engage in this type of exercise] -- -- -- -- --  Stress Interventions -- [employer], Provide Counseling Offered [employer], Provide Counseling Offered [employer], Provide Counseling -- Other (Comment)  [patient has been offered resources]  Health Literacy Interventions Intervention Not Indicated -- -- -- -- --     Care Plan  No Known Allergies  Medications Reviewed Today     Reviewed by Gerald Chandler, RN (Registered Nurse) on 03/25/23 at 1525  Med List Status: <None>   Medication Order Taking? Sig Documenting Provider Last Dose Status Informant  Blood Pressure Monitoring (BLOOD PRESSURE MONITOR AUTOMAT) DEVI [id number] No Check blood pressure in AM and PM daily Gerald Olson, Gerald Abed, Olson Taking Active   cyclobenzaprine (FLEXERIL) 5 MG tablet [id number]  Take 1 tablet (5 mg total) by mouth 3 (three) times daily as needed for muscle spasms. Gerald Olson  Active   dapagliflozin propanediol (FARXIGA) 5 MG TABS tablet [id number] No Take 1 tablet (5 mg total) by mouth daily before breakfast. Gerald Olson Taking Active   diclofenac (VOLTAREN) 50 MG EC tablet [id number]  Take 1 tablet (50 mg total) by mouth 2 (two) times daily. Gerald Olson  Active   Patient not taking:  Discontinued 08/09/20 1309 (Patient Preference)   glucose blood (ACCU-CHEK GUIDE) test strip [id number] No USE AS DIRECTED 2 TIMES DAILY Gerald Olson Taking Active   imatinib (GLEEVEC) 400 MG tablet [id number] No Take 1 tablet (400 mg total) by mouth daily. Gerald Olson Taking Active   insulin glargine (LANTUS SOLOSTAR) 100 UNIT/ML Solostar Pen [id number] No Inject 7 Units into the skin every morning. Gerald Pavlov,  Gerald Olson Taking Active   Insulin Pen Needle (TECHLITE PEN NEEDLES) 32G  X 4 MM MISC [id number] No Use to inject Victoza once a day Gerald Olson Taking Active   lisinopril (ZESTRIL) 20 MG tablet [id number] No Take 1 tablet (20 mg total) by mouth daily.  Patient not taking: Reported on 03/11/2023   Gerald Olson Not Taking Active   pregabalin (LYRICA) 100 MG capsule [id number]  Take 1 capsule (100 mg total) by mouth 2 (two) times daily. 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 08/09/2020   Mesenteric lymphadenopathy 06/14/2020   Splenomegaly- mild  06/14/2020   History of cannabis abuse 06/14/2020   Recurrent major depressive disorder, in partial remission (HCC) 06/09/2020   Anxiety 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 06/09/2020   Alcohol use disorder, moderate, dependence (HCC) 06/09/2020   Frequent falls 05/23/2020   Foraminal stenosis of cervical region 05/09/2020   Diabetic polyneuropathy    ADHD (attention deficit hyperactivity disorder)    Generalized anxiety disorder    Major depressive disorder    Cervical spondylosis 09/22/2019   Essential hypertension, benign 01/28/2019   Alcoholic cirrhosis of liver 06/02/2018   Type 2 diabetes mellitus with hyperglycemia 04/25/2018   Conditions to be addressed/monitored per PCP order:  Chronic healthcare management needs, HTN, DM, chronic pain, anxiety/depression/ADHD, h/o leukemia, polyneuropathy, cervical/lumbar spondylosis  Care Plan : RNCM Plan of Care  Updates made by Gerald Chandler, RN since 03/25/2023 12:00  AM     Problem: Chronic Disease Management and Care Coordination Needs for DM, HTN, CML and Back Pain      Long-Range Goal: Plan of Care for Chronic Disease management and Care Coordination Needs (HTN, DM, CML, Chronic Back Pain)   Start Date: 05/09/2021  Expected End Date: 06/25/2023  Priority: High  Note:   Current Barriers:  Knowledge Deficits related to plan of care for management of HTN, DMII, and CML and Chronic Back Pain  Chronic Disease Management support and education needs related to HTN, DMII, and CML and Chronic Back Pain 03/25/23:  Still in house, water remains an issue.  [employer] an application, is also speaking with [employer] writer.  Recent appts with ONC, has pain management and ENDO appointment coming up.  Checks BP and BG occasionally.  Ongoing neck and back pain-to get Qutenza patch tomorrow.    RNCM Clinical Goal(s):  Patient will verbalize understanding of plan for management of HTN, DMII, and CML and Chronic Back Pain verbalize basic understanding of HTN, DMII, and CML and Chronic Back Pain disease process and self health management plan  take all medications exactly as prescribed and will call provider for medication related questions attend all scheduled medical appointments demonstrate ongoing adherence to prescribed treatment plan for HTN, DMII, and CML and Chronic Back Pain as evidenced by daily monitoring and recording of CBG, adherence to ADA/ carb modified diet, adherence to prescribed medication regimen, contacting provider for Gerald or worsened symptoms or questions  demonstrate ongoing  health management independence  continue to work with RN Care Manager to address care management and care coordination needs related to HTN, DMII, and CML and Chronic Back Pain  work with pharmacist to address complex medication regimen  related to HTN, DMII, and CML and Chronic Back Pain work with Child psychotherapist to address financial constraints  related to overdue payment of back taxes and potential risk of losing home, Limited social support and counseling for anxiety/stress related to the management of HTN, DMII, and CML and Chronic Back Pain. collaborate with the care management team towards completion of advanced directives  through collaboration with RN Care manager, provider, and care team.   Interventions: Inter-disciplinary care team collaboration (see longitudinal plan of care) Evaluation of current treatment plan related to  self management and patient's adherence to plan as established by provider Collaboration with PCP office for Tramadol refill at patient request. BSW referral for resources-completed. Collaborated with BSW LCSW referral for depression/trauma Collaborated with LCSW To schedule  follow up appts with providers.  Diabetes:  (Status: Goal on Track (progressing): YES.) Lab Results  Component Value Date   HGBA1C 6.1 (A) 01/16/2022                                                                            6.6                                    04/04/22                                                                           7.3                                     08/09/22                                                                           6.4                                   11/08/22  Assessed patient's understanding of A1c goal: <6.5% Continue to provided education to patient about basic DM disease process; Reviewed medications with patient and discussed importance of medication adherence. Counseled on importance of regular laboratory monitoring as prescribed;        Discussed plans with patient for ongoing care management follow up and provided patient with direct contact information for care management team;  Reviewed scheduled/upcoming provider appointments including: See RNCM Clinical Goals above.       Call provider for findings outside established parameters.      Referral made to pharmacy  team previously for assistance with complex medication regimen.  Patient doing well with medication compliance and his understanding of all medications.  Pharmacist referral not needed at this time.       Review of patient status, including review of consultants reports, relevant laboratory and other test results, and medications completed.  Patient understands the importance of drinking low sugar drinks and use of sugar substitutes.       Chronic Myeloid Leukemia (CML)  (Status: Goal on Track (progressing): YES.) Evaluation of current treatment plan related to  CML and patient's adherence to plan as established by provider.  Patient continues to be in remission and the plan is for patient to continue on Gleevec 400 mg daily.  Patient continues to take the Gleevec with some protein to avoid having diarrhea.  Discussed plans with patient for ongoing care management follow up and provided patient with direct contact information for care management team Reviewed medications with patient and discussed the importance of taking all medications as prescribed; Pharmacy referral for complex medication regimen made previously.  [facility] Pharmacist not needed at this time. 07/13/22: ONC appt 07/04/22-WNL  Hypertension: (Status: Goal on Track (progressing): YES.) Last practice recorded Blood Pressure readings: BP Readings from Last 3 Encounters:   10/18/22:   136/86 12/05/22:    140/82 03/25/23     120/73  Most recent eGFR/CrCl: No results found for: EGFR  No components found for: CRCL  Evaluation of current treatment plan related to hypertension self management and patient's adherence to plan as established by provider;   Reviewed medications with patient and discussed importance of compliance.  Discussed plans with patient for ongoing care management follow up and provided patient with direct contact information for care management team; Discussed patient's recent blood pressure readings at recent medical  office visits.  Reviewed scheduled/upcoming provider appointments i Patient did obtain a blood pressure monitor through [facility].   Continued to re-enforce education on Orthostatic blood pressure changes that occur normally when changing positions from lying to sitting to standing.  Patient continues to report he does experience some lightheaded with moving from a sitting to a standing position which only last a few seconds.  Patient checks his blood pressure and heart rate at time of episode. Patient suspects caffeine may be making heart rate elevated. Instructed patient to change positions slowly and continue to monitor. Patient verbalized understanding.  Pain:  (Status: Goal on Track (progressing): YES.) Pain assessment performed.  Patient reports chronic neck & back pain. Pain rating is on a Pain Scale of 0 - 10. Currently using Ibuprofen 600 mg PRN during the day and Tramadol at night with fair relief - able to sleep only 2 hours at a stretch.  Pain interferes with sleep due to staying in same position for 2 hours.  Per patient, his pain is either triggered by staying in same position for a prolonged period of time or by moving too much. Medications reviewed.  Reviewed provider established plan for pain management; Discussed importance of adherence to all scheduled medical appointments; Patient continues to report having Neuropathy on his left side of his body (arm and leg) causing numbness and lack of coordination with movement.    Will continue to monitor.    Pain better with Qutenza patch  Patient Goals/Self-Care Activities: Patient  will self administer medications as prescribed as evidenced by self report/primary caregiver report  Patient will attend all scheduled provider appointments as evidenced by clinician review of documented attendance to scheduled appointments and patient/caregiver report Patient will call pharmacy for medication refills as evidenced by patient report and  review of pharmacy fill history as appropriate Patient will continue to perform ADL's independently as evidenced by patient/caregiver report Patient will continue to perform IADL's independently as evidenced by patient/caregiver report Patient will call provider office for Gerald concerns or questions as evidenced by review of documented incoming telephone call notes and patient report Patient will work with BSW to address care coordination needs and will continue to work with the clinical team to address health care and disease management related needs as evidenced by documented adherence to scheduled care management/care coordination appointments - check blood sugar at prescribed times: twice daily - enter blood sugar readings and medication or insulin into daily log - take the blood sugar log to all doctor visits - check blood pressure weekly - write blood pressure results in a log or diary - keep a blood pressure log - take blood pressure log to all doctor appointments - call doctor for signs and symptoms of high blood pressure - keep all doctor appointments - take medications for blood pressure exactly as prescribed   Follow Up:  Patient agrees to Care Plan and Follow-up.  Plan: The Managed Medicaid care management team will reach out to the patient again over the next 60 business  days. and The  Patient has been provided with contact information for the Managed Medicaid care management team and has been advised to call with any health related questions or concerns.  Date/time of next scheduled RN care management/care coordination outreach:  05/24/23 at 315

## 2023-03-25 NOTE — Patient Instructions (Signed)
Hi Gerald Olson, Thanks for updating me-I hope your appointments go well.  Please follow up with [employer].  Gerald Olson was given information about Medicaid Managed Care team care coordination services as a part of their Healthy [facility] Medicaid benefit. Gerald Olson verbally consented to engagement with the [facility] Managed Care team.   If you are experiencing a medical emergency, please call 911 or report to your local emergency department or urgent care.   If you have a non-emergency medical problem during routine business hours, please contact your provider's office and ask to speak with a nurse.   For questions related to your Healthy [facility] health plan, please call: [phone] or visit the homepage here: [email or web address]  If you would like to schedule transportation through your Healthy [facility] plan, please call the following number at least 2 days in advance of your appointment: [phone]  For information about your ride after you set it up, call Ride Assist at [phone]. Use this number to activate a Will Call pickup, or if your transportation is late for a scheduled pickup. Use this number, too, if you need to make a change or cancel a previously scheduled reservation.  If you need transportation services right away, call [phone]. The after-hours call center is staffed 24 hours to handle ride assistance and urgent reservation requests (including discharges) 365 days a year. Urgent trips include sick visits, hospital discharge requests and life-sustaining treatment.  Call the [facility] Line at [phone], at any time, 24 hours a day, 7 days a week. If you are in danger or need immediate medical attention call 911.  If you would like help to quit smoking, call 1-800-QUIT-NOW (863-406-3499) OR Espaol: 1-855-Djelo-Ya (7-564-332-9518) o para ms informacin haga clic aqu or Text READY to 841-660 to register via  text  Gerald Olson - following are the goals we discussed in your visit today:   Goals Addressed             This Visit's Progress    RNCM - Chronic Back Pain Management       Timeframe:  Long-Range Goal Priority:  High Start Date:   05/09/2021                          Expected End Date: ongoing  Patient Goals: Patient will self administer medications as prescribed Patient will attend all scheduled provider appointments Patient will call pharmacy for medication refills Patient will continue to perform ADL's independently Patient will continue to perform IADL's independently Patient will call provider office for new concerns or questions Patient will work with BSW to address care coordination needs and will continue to work with the clinical team to address health care and disease management related needs.   Patient's pain will be managed to a tolerable pain level where patient can be functional with ADL's and IADL's.    03/25/23:  followed by pain mgt-Qutenza, appt 8/26     RNCM - CML Disease Progression Minimized or Managed       Timeframe:  Long-Range Goal Priority:  High Start Date:     05/09/2021                       Expected End Date: ongoing  Patient Goals: Patient will self administer medications as prescribed Patient will attend all scheduled provider appointments Patient will call pharmacy for medication refills Patient will continue to perform ADL's independently Patient will  continue to perform IADL's independently Patient will call provider office for new concerns or questions Patient will work with BSW to address care coordination needs and will continue to work with the clinical team to address health care and disease management related needs.  01/23/23:  Followed by ONC-in remission, seen 7/16                        RNCM - Glycemic Management & Monitored       Timeframe:  Long-Range Goal Priority:  High Start Date:     05/09/2021                        Expected  End Date:  ongoing  Patient Goals: Patient will self administer medications as prescribed Patient will attend all scheduled provider appointments Patient will call pharmacy for medication refills Patient will continue to perform ADL's independently Patient will continue to perform IADL's independently Patient will call provider office for new concerns or questions Patient will work with BSW to address care coordination needs and will continue to work with the clinical team to address health care and disease management related needs.      Patient monitor blood sugars twice a day at varies times using Accu-check guide and record results. A1C level will remain at 6.0 or below.    03/25/23:  checks blood sugar occasionally, average 120s per patient, ENDO 9/6     RNCM - Hypertension Monitored and Managed       Timeframe:  Long-Range Goal Priority:  High Start Date:  05/09/2021                           Expected End Date:  ongoing  Patient Goals Patient will self administer medications as prescribed Patient will attend all scheduled provider appointments Patient will call pharmacy for medication refills Patient will continue to perform ADL's independently Patient will continue to perform IADL's independently Patient will call provider office for new concerns or questions Patient will work with BSW to address care coordination needs and will continue to work with the clinical team to address health care and disease management related needs.    Patient will check blood pressure with new blood pressure monitor at least once a week and record readings.          03/25/23:  Checks BP occasionally, 120/73   Patient verbalizes understanding of instructions and care plan provided today and agrees to view in MyChart. Active MyChart status and patient understanding of how to access instructions and care plan via MyChart confirmed with patient.     The Managed Medicaid care management team will reach out  to the patient again over the next 60 business  days.  The  Patient  has been provided with contact information for the Managed Medicaid care management team and has been advised to call with any health related questions or concerns.   Kathi Der RN, BSN [place]  [facility] Care Management Coordinator - Managed Medicaid High Risk [phone]   Following is a copy of your plan of care:  Care Plan : RNCM Plan of Care  Updates made by Danie Chandler, RN since 03/25/2023 12:00 AM     Problem: Chronic Disease Management and Care Coordination Needs for DM, HTN, CML and Back Pain      Long-Range Goal: Plan of Care for Chronic Disease management and Care  Coordination Needs (HTN, DM, CML, Chronic Back Pain)   Start Date: 05/09/2021  Expected End Date: 06/25/2023  Priority: High  Note:   Current Barriers:  Knowledge Deficits related to plan of care for management of HTN, DMII, and CML and Chronic Back Pain  Chronic Disease Management support and education needs related to HTN, DMII, and CML and Chronic Back Pain 03/25/23:  Still in house, water remains an issue.  [employer] an application, is also speaking with [employer] writer.  Recent appts with ONC, has pain management and ENDO appointment coming up.  Checks BP and BG occasionally.  Ongoing neck and back pain-to get Qutenza patch tomorrow.    RNCM Clinical Goal(s):  Patient will verbalize understanding of plan for management of HTN, DMII, and CML and Chronic Back Pain verbalize basic understanding of HTN, DMII, and CML and Chronic Back Pain disease process and self health management plan  take all medications exactly as prescribed and will call provider for medication related questions attend all scheduled medical appointments demonstrate ongoing adherence to prescribed treatment plan for HTN, DMII, and CML and Chronic Back Pain as evidenced by daily monitoring and recording of CBG,  adherence to ADA/ carb modified diet, adherence to prescribed medication regimen, contacting provider for new or worsened symptoms or questions  demonstrate ongoing health management independence  continue to work with RN Care Manager to address care management and care coordination needs related to HTN, DMII, and CML and Chronic Back Pain  work with pharmacist to address complex medication regimen  related to HTN, DMII, and CML and Chronic Back Pain work with Child psychotherapist to address financial constraints related to overdue payment of back taxes and potential risk of losing home, Limited social support and counseling for anxiety/stress related to the management of HTN, DMII, and CML and Chronic Back Pain. collaborate with the care management team towards completion of advanced directives  through collaboration with RN Care manager, provider, and care team.   Interventions: Inter-disciplinary care team collaboration (see longitudinal plan of care) Evaluation of current treatment plan related to  self management and patient's adherence to plan as established by provider Collaboration with PCP office for Tramadol refill at patient request. BSW referral for resources-completed. Collaborated with BSW LCSW referral for depression/trauma Collaborated with LCSW To schedule  follow up appts with providers.  Diabetes:  (Status: Goal on Track (progressing): YES.) Lab Results  Component Value Date   HGBA1C 6.1 (A) 01/16/2022                                                                            6.6                                    04/04/22  7.3                                     08/09/22                                                                           6.4                                   11/08/22  Assessed patient's understanding of A1c goal: <6.5% Continue to provided education to patient about basic DM disease  process; Reviewed medications with patient and discussed importance of medication adherence. Counseled on importance of regular laboratory monitoring as prescribed;        Discussed plans with patient for ongoing care management follow up and provided patient with direct contact information for care management team;      Reviewed scheduled/upcoming provider appointments including: See RNCM Clinical Goals above.       Call provider for findings outside established parameters.      Referral made to pharmacy team previously for assistance with complex medication regimen.  Patient doing well with medication compliance and his understanding of all medications.  Pharmacist referral not needed at this time.       Review of patient status, including review of consultants reports, relevant laboratory and other test results, and medications completed.  Patient understands the importance of drinking low sugar drinks and use of sugar substitutes.       Chronic Myeloid Leukemia (CML)  (Status: Goal on Track (progressing): YES.) Evaluation of current treatment plan related to  CML and patient's adherence to plan as established by provider.  Patient continues to be in remission and the plan is for patient to continue on Gleevec 400 mg daily.  Patient continues to take the Gleevec with some protein to avoid having diarrhea.  Discussed plans with patient for ongoing care management follow up and provided patient with direct contact information for care management team Reviewed medications with patient and discussed the importance of taking all medications as prescribed; Pharmacy referral for complex medication regimen made previously.  [facility] Pharmacist not needed at this time. 07/13/22: ONC appt 07/04/22-WNL  Hypertension: (Status: Goal on Track (progressing): YES.) Last practice recorded Blood Pressure readings: BP Readings from Last 3 Encounters:   10/18/22:   136/86 12/05/22:    140/82 03/25/23      120/73  Most recent eGFR/CrCl: No results found for: EGFR  No components found for: CRCL  Evaluation of current treatment plan related to hypertension self management and patient's adherence to plan as established by provider;   Reviewed medications with patient and discussed importance of compliance.  Discussed plans with patient for ongoing care management follow up and provided patient with direct contact information for care management team; Discussed patient's recent blood pressure readings at recent medical office visits.  Reviewed scheduled/upcoming provider appointments i Patient did obtain a blood pressure monitor through [facility].   Continued to re-enforce education on Orthostatic blood pressure changes that occur normally when changing positions from lying to sitting to  standing.  Patient continues to report he does experience some lightheaded with moving from a sitting to a standing position which only last a few seconds.  Patient checks his blood pressure and heart rate at time of episode. Patient suspects caffeine may be making heart rate elevated. Instructed patient to change positions slowly and continue to monitor. Patient verbalized understanding.  Pain:  (Status: Goal on Track (progressing): YES.) Pain assessment performed.  Patient reports chronic neck & back pain. Pain rating is on a Pain Scale of 0 - 10. Currently using Ibuprofen 600 mg PRN during the day and Tramadol at night with fair relief - able to sleep only 2 hours at a stretch.  Pain interferes with sleep due to staying in same position for 2 hours.  Per patient, his pain is either triggered by staying in same position for a prolonged period of time or by moving too much. Medications reviewed.  Reviewed provider established plan for pain management; Discussed importance of adherence to all scheduled medical appointments; Patient continues to report having Neuropathy on his left side of his body (arm and leg)  causing numbness and lack of coordination with movement.    Will continue to monitor.    Pain better with Qutenza patch  Patient Goals/Self-Care Activities: Patient will self administer medications as prescribed as evidenced by self report/primary caregiver report  Patient will attend all scheduled provider appointments as evidenced by clinician review of documented attendance to scheduled appointments and patient/caregiver report Patient will call pharmacy for medication refills as evidenced by patient report and review of pharmacy fill history as appropriate Patient will continue to perform ADL's independently as evidenced by patient/caregiver report Patient will continue to perform IADL's independently as evidenced by patient/caregiver report Patient will call provider office for new concerns or questions as evidenced by review of documented incoming telephone call notes and patient report Patient will work with BSW to address care coordination needs and will continue to work with the clinical team to address health care and disease management related needs as evidenced by documented adherence to scheduled care management/care coordination appointments - check blood sugar at prescribed times: twice daily - enter blood sugar readings and medication or insulin into daily log - take the blood sugar log to all doctor visits - check blood pressure weekly - write blood pressure results in a log or diary - keep a blood pressure log - take blood pressure log to all doctor appointments - call doctor for signs and symptoms of high blood pressure - keep all doctor appointments - take medications for blood pressure exactly as prescribed

## 2023-03-26 ENCOUNTER — Ambulatory Visit: Payer: Medicaid Other | Admitting: Licensed Clinical Social Worker

## 2023-04-02 ENCOUNTER — Other Ambulatory Visit (HOSPITAL_COMMUNITY): Payer: Self-pay

## 2023-04-02 ENCOUNTER — Other Ambulatory Visit: Payer: Self-pay

## 2023-04-02 ENCOUNTER — Telehealth: Payer: Self-pay | Admitting: Physical Medicine & Rehabilitation

## 2023-04-02 NOTE — Telephone Encounter (Signed)
Desiree from [employer] called this morning asking for a call back in regards to Qutenza patch delivery

## 2023-04-02 NOTE — Telephone Encounter (Signed)
Spoke to [employer] they was needing verification to where to send Qutenza to.

## 2023-04-17 ENCOUNTER — Other Ambulatory Visit (HOSPITAL_COMMUNITY): Payer: Self-pay

## 2023-04-19 ENCOUNTER — Other Ambulatory Visit (HOSPITAL_COMMUNITY): Payer: Self-pay

## 2023-04-22 ENCOUNTER — Other Ambulatory Visit (HOSPITAL_COMMUNITY): Payer: Self-pay

## 2023-04-22 ENCOUNTER — Encounter: Payer: Medicaid Other | Admitting: Physical Medicine & Rehabilitation

## 2023-04-22 ENCOUNTER — Encounter (HOSPITAL_COMMUNITY): Payer: Self-pay

## 2023-05-13 ENCOUNTER — Ambulatory Visit: Payer: Medicaid Other | Admitting: Internal Medicine

## 2023-05-21 ENCOUNTER — Other Ambulatory Visit: Payer: Medicaid Other

## 2023-05-21 NOTE — Patient Outreach (Signed)
Medicaid Managed Care   Unsuccessful Outreach Note  05/21/2023 Name: Gerald Olson MRN: [id number] DOB: 04-20-1966  Referred by: Alfredia Ferguson, PA-C Reason for referral : High Risk Managed Medicaid (MM social work unsuccessful telephone outreach )   An unsuccessful telephone outreach was attempted today. The patient was referred to the case management team for assistance with care management and care coordination.   Follow Up Plan: A HIPAA compliant phone message was left for the patient providing contact information and requesting a return call.   Abelino Derrick, MHA [facility]  Managed [facility] Social Worker [phone]

## 2023-05-21 NOTE — Patient Instructions (Signed)
Medicaid Managed Care   Unsuccessful Outreach Note  05/21/2023 Name: Gerald Olson MRN: [id number] DOB: 04-20-1966  Referred by: Alfredia Ferguson, PA-C Reason for referral : High Risk Managed Medicaid (MM social work unsuccessful telephone outreach )   An unsuccessful telephone outreach was attempted today. The patient was referred to the case management team for assistance with care management and care coordination.   Follow Up Plan: A HIPAA compliant phone message was left for the patient providing contact information and requesting a return call.   Abelino Derrick, MHA [facility]  Managed [facility] Social Worker [phone]

## 2023-05-24 ENCOUNTER — Other Ambulatory Visit: Payer: Medicaid Other | Admitting: Obstetrics and Gynecology

## 2023-05-24 NOTE — Patient Instructions (Signed)
Hi Gerald Olson you for the updates.  I hope your back feels better soon.  Gerald Olson was given information about Medicaid Managed Care team care coordination services as a part of their Healthy [facility] Medicaid benefit. Gerald Olson verbally consented to engagement with the [facility] Managed Care team.   If you are experiencing a medical emergency, please call 911 or report to your local emergency department or urgent care.   If you have a non-emergency medical problem during routine business hours, please contact your provider's office and ask to speak with a nurse.   For questions related to your Healthy [facility] health plan, please call: [phone] or visit the homepage here: [email or web address]  If you would like to schedule transportation through your Healthy [facility] plan, please call the following number at least 2 days in advance of your appointment: [phone]  For information about your ride after you set it up, call Ride Assist at [phone]. Use this number to activate a Will Call pickup, or if your transportation is late for a scheduled pickup. Use this number, too, if you need to make a change or cancel a previously scheduled reservation.  If you need transportation services right away, call [phone]. The after-hours call center is staffed 24 hours to handle ride assistance and urgent reservation requests (including discharges) 365 days a year. Urgent trips include sick visits, hospital discharge requests and life-sustaining treatment.  Call the [facility] Line at [phone], at any time, 24 hours a day, 7 days a week. If you are in danger or need immediate medical attention call 911.  If you would like help to quit smoking, call 1-800-QUIT-NOW (360-232-9188) OR Espaol: 1-855-Djelo-Ya (7-616-073-7106) o para ms informacin haga clic aqu or Text READY to 269-485 to register via text  Gerald Olson - following are  the goals we discussed in your visit today:   Goals Addressed             This Visit's Progress    RNCM - Chronic Back Pain Management       Timeframe:  Long-Range Goal Priority:  High Start Date:   05/09/2021                          Expected End Date: ongoing  Patient Goals: Patient will self administer medications as prescribed Patient will attend all scheduled provider appointments Patient will call pharmacy for medication refills Patient will continue to perform ADL's independently Patient will continue to perform IADL's independently Patient will call provider office for new concerns or questions Patient will work with BSW to address care coordination needs and will continue to work with the clinical team to address health care and disease management related needs.   Patient's pain will be managed to a tolerable pain level where patient can be functional with ADL's and IADL's.    05/23/26-complaining of increased pain today-has appt with pain management 10/3     RNCM - CML Disease Progression Minimized or Managed       Timeframe:  Long-Range Goal Priority:  High Start Date:     05/09/2021                       Expected End Date: ongoing  Patient Goals: Patient will self administer medications as prescribed Patient will attend all scheduled provider appointments Patient will call pharmacy for medication refills Patient will continue to perform ADL's independently Patient will continue to  perform IADL's independently Patient will call provider office for new concerns or questions Patient will work with BSW to address care coordination needs and will continue to work with the clinical team to address health care and disease management related needs.  05/24/23:  Followed by ONC-in remission, seen 7/16                    RNCM - Glycemic Management & Monitored       Timeframe:  Long-Range Goal Priority:  High Start Date:     05/09/2021                        Expected End Date:   ongoing  Patient Goals: Patient will self administer medications as prescribed Patient will attend all scheduled provider appointments Patient will call pharmacy for medication refills Patient will continue to perform ADL's independently Patient will continue to perform IADL's independently Patient will call provider office for new concerns or questions Patient will work with BSW to address care coordination needs and will continue to work with the clinical team to address health care and disease management related needs.      Patient monitor blood sugars twice a day at varies times using Accu-check guide and record results. A1C level will remain at 6.0 or below.    05/24/23: checks Blood sugar occasionally.  Average 139 per patient.  ENDO appt 10/8    RNCM - Hypertension Monitored and Managed       Timeframe:  Long-Range Goal Priority:  High Start Date:  05/09/2021                           Expected End Date:  ongoing  Patient Goals Patient will self administer medications as prescribed Patient will attend all scheduled provider appointments Patient will call pharmacy for medication refills Patient will continue to perform ADL's independently Patient will continue to perform IADL's independently Patient will call provider office for new concerns or questions Patient will work with BSW to address care coordination needs and will continue to work with the clinical team to address health care and disease management related needs.    Patient will check blood pressure with new blood pressure monitor at least once a week and record readings.          05/24/23:  No current BP readings available-has not checked BP recently.   Patient verbalizes understanding of instructions and care plan provided today and agrees to view in MyChart. Active MyChart status and patient understanding of how to access instructions and care plan via MyChart confirmed with patient.     The Managed Medicaid care  management team will reach out to the patient again over the next 60 business  days.  The  Patient has been provided with contact information for the Managed Medicaid care management team and has been advised to call with any health related questions or concerns.   Kathi Der RN, BSN [place]  [facility] Care Management Coordinator - Managed Medicaid High Risk [phone]   Following is a copy of your plan of care:  Care Plan : RNCM Plan of Care  Updates made by Danie Chandler, RN since 05/24/2023 12:00 AM     Problem: Chronic Disease Management and Care Coordination Needs for DM, HTN, CML and Back Pain      Long-Range Goal: Plan of Care for Chronic Disease management and Care Coordination  Needs (HTN, DM, CML, Chronic Back Pain)   Start Date: 05/09/2021  Expected End Date: 08/23/2023  Priority: High  Note:   Current Barriers:  Knowledge Deficits related to plan of care for management of HTN, DMII, and CML and Chronic Back Pain  Chronic Disease Management support and education needs related to HTN, DMII, and CML and Chronic Back Pain 05/24/23:  Increased back pain-wearing brace-has appt with pain management next week.  Upcoming ENDO appt 10/8-average blood sugar 139 per patient.  Has new dog.  No recent BP reading.  Has not completed [employer]	[place] yet.  RNCM Clinical Goal(s):  Patient will verbalize understanding of plan for management of HTN, DMII, and CML and Chronic Back Pain verbalize basic understanding of HTN, DMII, and CML and Chronic Back Pain disease process and self health management plan  take all medications exactly as prescribed and will call provider for medication related questions attend all scheduled medical appointments demonstrate ongoing adherence to prescribed treatment plan for HTN, DMII, and CML and Chronic Back Pain as evidenced by daily monitoring and recording of CBG, adherence to ADA/ carb modified diet, adherence to  prescribed medication regimen, contacting provider for new or worsened symptoms or questions  demonstrate ongoing health management independence  continue to work with RN Care Manager to address care management and care coordination needs related to HTN, DMII, and CML and Chronic Back Pain  work with pharmacist to address complex medication regimen  related to HTN, DMII, and CML and Chronic Back Pain work with Child psychotherapist to address financial constraints related to overdue payment of back taxes and potential risk of losing home, Limited social support and counseling for anxiety/stress related to the management of HTN, DMII, and CML and Chronic Back Pain. collaborate with the care management team towards completion of advanced directives  through collaboration with RN Care manager, provider, and care team.   Interventions: Inter-disciplinary care team collaboration (see longitudinal plan of care) Evaluation of current treatment plan related to  self management and patient's adherence to plan as established by provider Collaboration with PCP office for Tramadol refill at patient request. BSW referral for resources-completed Collaborated with BSW LCSW referral for depression/trauma-completed Collaborated with LCSW 05/24/23: Rescheduled BSW appt.  Diabetes:  (Status: Goal on Track (progressing): YES.) Lab Results  Component Value Date   HGBA1C 6.1 (A) 01/16/2022                                                                            6.6                                    04/04/22                                                                           7.3  08/09/22                                                                           6.4                                   11/08/22  Assessed patient's understanding of A1c goal: <6.5% Continue to provided education to patient about basic DM disease process; Reviewed medications with patient and discussed  importance of medication adherence. Counseled on importance of regular laboratory monitoring as prescribed;        Discussed plans with patient for ongoing care management follow up and provided patient with direct contact information for care management team;      Reviewed scheduled/upcoming provider appointments including: See RNCM Clinical Goals above.       Call provider for findings outside established parameters.      Referral made to pharmacy team previously for assistance with complex medication regimen.  Patient doing well with medication compliance and his understanding of all medications.  Pharmacist referral not needed at this time.       Review of patient status, including review of consultants reports, relevant laboratory and other test results, and medications completed.  Patient understands the importance of drinking low sugar drinks and use of sugar substitutes.       Chronic Myeloid Leukemia (CML)  (Status: Goal on Track (progressing): YES.) Evaluation of current treatment plan related to  CML and patient's adherence to plan as established by provider.  Patient continues to be in remission and the plan is for patient to continue on Gleevec 400 mg daily.  Patient continues to take the Gleevec with some protein to avoid having diarrhea.  Discussed plans with patient for ongoing care management follow up and provided patient with direct contact information for care management team Reviewed medications with patient and discussed the importance of taking all medications as prescribed; Pharmacy referral for complex medication regimen made previously.  [facility] Pharmacist not needed at this time. 07/13/22: ONC appt 07/04/22-WNL  Hypertension: (Status: Goal on Track (progressing): YES.) Last practice recorded Blood Pressure readings: BP Readings from Last 3 Encounters:   10/18/22:   136/86 12/05/22:    140/82 03/25/23     120/73  Most recent eGFR/CrCl: No results found for: EGFR  No  components found for: CRCL  Evaluation of current treatment plan related to hypertension self management and patient's adherence to plan as established by provider;   Reviewed medications with patient and discussed importance of compliance.  Discussed plans with patient for ongoing care management follow up and provided patient with direct contact information for care management team; Discussed patient's recent blood pressure readings at recent medical office visits.  Reviewed scheduled/upcoming provider appointments i Patient did obtain a blood pressure monitor through [facility].   Continued to re-enforce education on Orthostatic blood pressure changes that occur normally when changing positions from lying to sitting to standing.  Patient continues to report he does experience some lightheaded with moving from a sitting to a standing position which only last a few seconds.  Patient checks his blood pressure and heart rate at time  of episode. Patient suspects caffeine may be making heart rate elevated. Instructed patient to change positions slowly and continue to monitor. Patient verbalized understanding.  Pain:  (Status: Goal on Track (progressing): YES.) Pain assessment performed.  Patient reports chronic neck & back pain.Currently using Ibuprofen 600 mg PRN during the day and Tramadol at night with fair relief - able to sleep only 2 hours at a stretch.  Pain interferes with sleep due to staying in same position for 2 hours.  Per patient, his pain is either triggered by staying in same position for a prolonged period of time or by moving too much. Medications reviewed.  Reviewed provider established plan for pain management; Discussed importance of adherence to all scheduled medical appointments; Patient continues to report having Neuropathy on his left side of his body (arm and leg) causing numbness and lack of coordination with movement.    Will continue to monitor.    Pain better with  Qutenza patch  Patient Goals/Self-Care Activities: Patient will self administer medications as prescribed as evidenced by self report/primary caregiver report  Patient will attend all scheduled provider appointments as evidenced by clinician review of documented attendance to scheduled appointments and patient/caregiver report Patient will call pharmacy for medication refills as evidenced by patient report and review of pharmacy fill history as appropriate Patient will continue to perform ADL's independently as evidenced by patient/caregiver report Patient will call provider office for new concerns or questions as evidenced by review of documented incoming telephone call notes and patient report Patient will work with BSW to address care coordination needs and will continue to work with the clinical team to address health care and disease management related needs as evidenced by documented adherence to scheduled care management/care coordination appointments - check blood sugar at prescribed times: twice daily - enter blood sugar readings and medication or insulin into daily log - take the blood sugar log to all doctor visits - check blood pressure weekly - write blood pressure results in a log or diary - keep a blood pressure log - take blood pressure log to all doctor appointments - call doctor for signs and symptoms of high blood pressure - keep all doctor appointments - take medications for blood pressure exactly as prescribed

## 2023-05-24 NOTE — Patient Outreach (Signed)
Medicaid Managed Care   Nurse Care Manager Note  05/24/2023 Name:  Gerald Olson MRN:  [id number] DOB:  1966/04/04  Gerald Olson is an 57 y.o. year old male who is a primary patient of Ok Edwards, Lillia Abed, [place].  The [facility] Managed Care Coordination team was consulted for assistance with:    Chronic healthcare management needs, HTN, DM, chronic pain, anxiety/depression, ADHD, h/o leukemia, polyneuropathy, cervical/lumbar spondylosis  [name] was given information about Medicaid Managed Care Coordination team services today. Gerald Olson Patient agreed to services and verbal consent obtained.  Engaged with patient by telephone for follow up visit in response to provider referral for case management and/or care coordination services.   Assessments/Interventions:  Review of past medical history, allergies, medications, health status, including review of consultants reports, laboratory and other test data, was performed as part of comprehensive evaluation and provision of chronic care management services.  SDOH (Social Determinants of Health) assessments and interventions performed: SDOH Interventions    Flowsheet Row Patient Outreach Telephone from 05/24/2023 in [place] POPULATION HEALTH DEPARTMENT Patient Outreach Telephone from 03/25/2023 in [place] POPULATION HEALTH DEPARTMENT Patient Outreach Telephone from 03/18/2023 in [place] POPULATION HEALTH DEPARTMENT Patient Outreach Telephone from 02/12/2023 in [place] POPULATION HEALTH DEPARTMENT Patient Outreach Telephone from 01/29/2023 in [place] POPULATION HEALTH DEPARTMENT Patient Outreach Telephone from 01/23/2023 in [place] POPULATION HEALTH DEPARTMENT  SDOH Interventions        Housing Interventions -- -- -- -- --  Research officer, trade union for [employer] BSW involvement] --  Transportation Interventions -- -- -- Intervention Not Indicated -- --  Alcohol Usage Interventions -- -- -- -- -- Alcohol Education/Brief Advice  [education provided by  providers]  Financial Strain Interventions Other (Comment)  [patiewnt has been provided with resources] -- -- -- -- --  Physical Activity Interventions -- Intervention Not Indicated  [spondylosis-unable to engage in this type of exercise] -- -- -- --  Stress Interventions -- -- [employer], Provide Counseling Offered [employer], Provide Counseling Offered [employer] Resources, Provide Counseling --  Social Connections Interventions Intervention Not Indicated -- -- -- -- --  Health Literacy Interventions -- Intervention Not Indicated -- -- -- --     Care Plan No Known Allergies  Medications Reviewed Today     Reviewed by Danie Chandler, RN (Registered Nurse) on 05/24/23 at 1535  Med List Status: <None>   Medication Order Taking? Sig Documenting Provider Last Dose Status Informant  Blood Pressure Monitoring (BLOOD PRESSURE MONITOR AUTOMAT) DEVI [id number] No Check blood pressure in AM and PM daily Drubel, Lillia Abed, PA-C Taking Active   cyclobenzaprine (FLEXERIL) 5 MG tablet [id number]  Take 1 tablet (5 mg total) by mouth 3 (three) times daily as needed for muscle spasms. Fanny Dance, MD  Active   dapagliflozin propanediol (FARXIGA) 5 MG TABS tablet [id number] No Take 1 tablet (5 mg total) by mouth daily before breakfast. Carlus Pavlov, MD Taking Active   diclofenac (VOLTAREN) 50 MG EC tablet [id number]  Take 1 tablet (50 mg total) by mouth 2 (two) times daily. Fanny Dance, MD  Active   Patient not taking:  Discontinued 08/09/20 1309 (Patient Preference)   glucose blood (ACCU-CHEK GUIDE) test strip [id number] No USE AS DIRECTED 2 TIMES DAILY Carlus Pavlov, MD Taking Active   imatinib (GLEEVEC) 400 MG tablet [id number] No Take 1 tablet (400 mg total) by mouth daily. Clent Jacks M, PA-C Taking Active   insulin glargine (LANTUS SOLOSTAR) 100 UNIT/ML Solostar Pen  [id number] No Inject 7 Units into the skin every morning. Carlus Pavlov, MD Taking Active   Insulin Pen Needle (TECHLITE PEN NEEDLES) 32G X 4 MM MISC [id number] No Use to inject Victoza once a day Carlus Pavlov, MD Taking Active   lisinopril (ZESTRIL) 20 MG tablet [id number] No Take 1 tablet (20 mg total) by mouth daily.  Patient not taking: Reported on 03/11/2023   Alfredia Ferguson, PA-C Not Taking Active   pregabalin (LYRICA) 100 MG capsule [id number]  Take 1 capsule (100 mg total) by mouth 2 (two) times daily. 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 08/09/2020   Mesenteric lymphadenopathy 06/14/2020   Splenomegaly- mild  06/14/2020   History of cannabis abuse 06/14/2020   Recurrent major depressive disorder, in partial remission (HCC) 06/09/2020   Anxiety 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 06/09/2020   Alcohol use disorder, moderate, dependence (HCC) 06/09/2020   Frequent falls 05/23/2020   Foraminal stenosis of cervical region 05/09/2020   Diabetic polyneuropathy    ADHD (attention deficit hyperactivity disorder)    Generalized anxiety disorder    Major depressive disorder    Cervical spondylosis 09/22/2019   Essential hypertension, benign 01/28/2019   Alcoholic cirrhosis of liver 06/02/2018   Type 2 diabetes mellitus with hyperglycemia 04/25/2018   Conditions to be addressed/monitored per PCP order:  Chronic healthcare management needs, HTN, DM, chronic pain, anxiety/depression, ADHD, h/o leukemia, polyneuropathy, cervical/lumbar spondylosis  Care Plan :  RNCM Plan of Care  Updates made by Danie Chandler, RN since 05/24/2023 12:00 AM     Problem: Chronic Disease Management and Care Coordination Needs for DM, HTN, CML and Back Pain      Long-Range Goal: Plan of Care for Chronic Disease management and Care Coordination Needs (HTN, DM, CML, Chronic Back Pain)   Start Date: 05/09/2021  Expected End Date: 08/23/2023  Priority: High  Note:   Current Barriers:  Knowledge Deficits related to plan of care for management of HTN, DMII, and CML and Chronic Back Pain  Chronic Disease Management support and education needs related to HTN, DMII, and CML and Chronic Back Pain 05/24/23:  Increased back pain-wearing brace-has appt with pain management next week.  Upcoming ENDO appt 10/8-average blood sugar 139 per patient.  Has new dog.  No recent BP reading.  Has not completed [employer]	[place] yet.  RNCM Clinical Goal(s):  Patient will verbalize understanding of plan for management of HTN, DMII, and CML and Chronic Back Pain verbalize basic understanding of HTN, DMII, and CML and Chronic Back Pain disease process and self health management plan  take all medications exactly as prescribed and will call provider for medication related questions attend all scheduled medical appointments demonstrate ongoing adherence to prescribed treatment plan for HTN, DMII, and CML and Chronic Back Pain as evidenced by daily monitoring and recording of CBG, adherence to ADA/ carb modified diet, adherence to prescribed medication regimen, contacting provider for new or worsened symptoms or questions  demonstrate ongoing health management  independence  continue to work with RN Care Manager to address care management and care coordination needs related to HTN, DMII, and CML and Chronic Back Pain  work with pharmacist to address complex medication regimen  related to HTN, DMII, and CML and Chronic Back Pain work with Child psychotherapist to address financial  constraints related to overdue payment of back taxes and potential risk of losing home, Limited social support and counseling for anxiety/stress related to the management of HTN, DMII, and CML and Chronic Back Pain. collaborate with the care management team towards completion of advanced directives  through collaboration with RN Care manager, provider, and care team.   Interventions: Inter-disciplinary care team collaboration (see longitudinal plan of care) Evaluation of current treatment plan related to  self management and patient's adherence to plan as established by provider Collaboration with PCP office for Tramadol refill at patient request. BSW referral for resources-completed Collaborated with BSW LCSW referral for depression/trauma-completed Collaborated with LCSW 05/24/23: Rescheduled BSW appt.  Diabetes:  (Status: Goal on Track (progressing): YES.) Lab Results  Component Value Date   HGBA1C 6.1 (A) 01/16/2022                                                                            6.6                                    04/04/22                                                                           7.3                                     08/09/22                                                                           6.4                                   11/08/22  Assessed patient's understanding of A1c goal: <6.5% Continue to provided education to patient about basic DM disease process; Reviewed medications with patient and discussed importance of medication adherence. Counseled on importance of regular laboratory monitoring as prescribed;        Discussed plans with patient for ongoing care management follow up and provided patient with direct contact information for care management team;      Reviewed scheduled/upcoming provider appointments  including: See RNCM Clinical Goals above.       Call provider for findings outside established parameters.      Referral made to  pharmacy team previously for assistance with complex medication regimen.  Patient doing well with medication compliance and his understanding of all medications.  Pharmacist referral not needed at this time.       Review of patient status, including review of consultants reports, relevant laboratory and other test results, and medications completed.  Patient understands the importance of drinking low sugar drinks and use of sugar substitutes.       Chronic Myeloid Leukemia (CML)  (Status: Goal on Track (progressing): YES.) Evaluation of current treatment plan related to  CML and patient's adherence to plan as established by provider.  Patient continues to be in remission and the plan is for patient to continue on Gleevec 400 mg daily.  Patient continues to take the Gleevec with some protein to avoid having diarrhea.  Discussed plans with patient for ongoing care management follow up and provided patient with direct contact information for care management team Reviewed medications with patient and discussed the importance of taking all medications as prescribed; Pharmacy referral for complex medication regimen made previously.  [facility] Pharmacist not needed at this time. 07/13/22: ONC appt 07/04/22-WNL  Hypertension: (Status: Goal on Track (progressing): YES.) Last practice recorded Blood Pressure readings: BP Readings from Last 3 Encounters:   10/18/22:   136/86 12/05/22:    140/82 03/25/23     120/73  Most recent eGFR/CrCl: No results found for: EGFR  No components found for: CRCL  Evaluation of current treatment plan related to hypertension self management and patient's adherence to plan as established by provider;   Reviewed medications with patient and discussed importance of compliance.  Discussed plans with patient for ongoing care management follow up and provided patient with direct contact information for care management team; Discussed patient's recent blood pressure readings at recent  medical office visits.  Reviewed scheduled/upcoming provider appointments i Patient did obtain a blood pressure monitor through [facility].   Continued to re-enforce education on Orthostatic blood pressure changes that occur normally when changing positions from lying to sitting to standing.  Patient continues to report he does experience some lightheaded with moving from a sitting to a standing position which only last a few seconds.  Patient checks his blood pressure and heart rate at time of episode. Patient suspects caffeine may be making heart rate elevated. Instructed patient to change positions slowly and continue to monitor. Patient verbalized understanding.  Pain:  (Status: Goal on Track (progressing): YES.) Pain assessment performed.  Patient reports chronic neck & back pain.Currently using Ibuprofen 600 mg PRN during the day and Tramadol at night with fair relief - able to sleep only 2 hours at a stretch.  Pain interferes with sleep due to staying in same position for 2 hours.  Per patient, his pain is either triggered by staying in same position for a prolonged period of time or by moving too much. Medications reviewed.  Reviewed provider established plan for pain management; Discussed importance of adherence to all scheduled medical appointments; Patient continues to report having Neuropathy on his left side of his body (arm and leg) causing numbness and lack of coordination with movement.    Will continue to monitor.    Pain better with Qutenza patch  Patient Goals/Self-Care Activities: Patient will self administer medications as prescribed as evidenced by self report/primary caregiver report  Patient will  attend all scheduled provider appointments as evidenced by clinician review of documented attendance to scheduled appointments and patient/caregiver report Patient will call pharmacy for medication refills as evidenced by patient report and review of pharmacy fill history as  appropriate Patient will continue to perform ADL's independently as evidenced by patient/caregiver report Patient will call provider office for new concerns or questions as evidenced by review of documented incoming telephone call notes and patient report Patient will work with BSW to address care coordination needs and will continue to work with the clinical team to address health care and disease management related needs as evidenced by documented adherence to scheduled care management/care coordination appointments - check blood sugar at prescribed times: twice daily - enter blood sugar readings and medication or insulin into daily log - take the blood sugar log to all doctor visits - check blood pressure weekly - write blood pressure results in a log or diary - keep a blood pressure log - take blood pressure log to all doctor appointments - call doctor for signs and symptoms of high blood pressure - keep all doctor appointments - take medications for blood pressure exactly as prescribed   Follow Up:  Patient agrees to Care Plan and Follow-up.  Plan: The Managed Medicaid care management team will reach out to the patient again over the next 60 business  days. and The  Patient has been provided with contact information for the Managed Medicaid care management team and has been advised to call with any health related questions or concerns.  Date/time of next scheduled RN care management/care coordination outreach:  07/24/23 at 315

## 2023-05-30 ENCOUNTER — Encounter: Payer: Medicaid Other | Attending: Physical Medicine & Rehabilitation | Admitting: Physical Medicine & Rehabilitation

## 2023-05-30 ENCOUNTER — Encounter: Payer: Self-pay | Admitting: Physical Medicine & Rehabilitation

## 2023-05-30 ENCOUNTER — Other Ambulatory Visit: Payer: Medicaid Other

## 2023-05-30 VITALS — BP 131/86 | HR 98 | Ht 75.0 in | Wt 233.8 lb

## 2023-05-30 DIAGNOSIS — Z794 Long term (current) use of insulin: Secondary | ICD-10-CM | POA: Diagnosis not present

## 2023-05-30 DIAGNOSIS — E1142 Type 2 diabetes mellitus with diabetic polyneuropathy: Secondary | ICD-10-CM | POA: Insufficient documentation

## 2023-05-30 DIAGNOSIS — M47816 Spondylosis without myelopathy or radiculopathy, lumbar region: Secondary | ICD-10-CM | POA: Diagnosis not present

## 2023-05-30 DIAGNOSIS — M47812 Spondylosis without myelopathy or radiculopathy, cervical region: Secondary | ICD-10-CM | POA: Diagnosis not present

## 2023-05-30 MED ORDER — CAPSAICIN-CLEANSING GEL 8 % EX KIT
4.0000 | PACK | Freq: Once | CUTANEOUS | Status: AC
Start: 2023-05-30 — End: 2023-05-30
  Administered 2023-05-30: 4 via TOPICAL

## 2023-05-30 NOTE — Patient Outreach (Signed)
Medicaid Managed Care Social Work Note  05/30/2023 Name:  Gerald Olson MRN:  [id number] DOB:  Nov 25, 1965  Gerald Olson is an 57 y.o. year old male who is a primary patient of Gerald Olson, Gerald Olson, [place].  The [facility] Managed Care Coordination team was consulted for assistance with:   PCP   Gerald Olson was given information about Medicaid Managed Care Coordination team services today. Gerald Olson Patient agreed to services and verbal consent obtained.  Engaged with patient  for by telephone forfollow up visit in response to referral for case management and/or care coordination services.   Assessments/Interventions:  Review of past medical history, allergies, medications, health status, including review of consultants reports, laboratory and other test data, was performed as part of comprehensive evaluation and provision of chronic care management services.  SDOH: (Social Determinant of Health) assessments and interventions performed: SDOH Interventions    Flowsheet Row Patient Outreach Telephone from 05/24/2023 in [place] POPULATION HEALTH DEPARTMENT Patient Outreach Telephone from 03/25/2023 in [place] POPULATION HEALTH DEPARTMENT Patient Outreach Telephone from 03/18/2023 in [place] POPULATION HEALTH DEPARTMENT Patient Outreach Telephone from 02/12/2023 in [place] POPULATION HEALTH DEPARTMENT Patient Outreach Telephone from 01/29/2023 in [place] POPULATION HEALTH DEPARTMENT Patient Outreach Telephone from 01/23/2023 in [place] POPULATION HEALTH DEPARTMENT  SDOH Interventions        Housing Interventions -- -- -- -- --  Research officer, trade union for [employer] BSW involvement] --  Transportation Interventions -- -- -- Intervention Not Indicated -- --  Alcohol Usage Interventions -- -- -- -- -- Alcohol Education/Brief Advice  [education provided by providers]  Financial Strain Interventions Other (Comment)  [patiewnt has been provided with resources] -- -- -- -- --  Physical Activity Interventions --  Intervention Not Indicated  [spondylosis-unable to engage in this type of exercise] -- -- -- --  Stress Interventions -- -- [employer], Provide Counseling Offered [employer], Provide Counseling Offered [employer], Provide Counseling --  Social Connections Interventions Intervention Not Indicated -- -- -- -- --  Health Literacy Interventions -- Intervention Not Indicated -- -- -- --     BSW completed a telephone outreach with patient, he states he was at an appointment but it was a good time to talk. Patient states he got a new dog and he now has the water back on in the home. He reports he is still making repairs on the home. Patient states he would like a new PCP due to his PCP leaving, BSW provided patient with the [email or web address] website. No other resources are needed at this time.  Advanced Directives Status:  Not addressed in this encounter.  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 08/09/2020   Mesenteric lymphadenopathy 06/14/2020   Splenomegaly- mild  06/14/2020   History of cannabis abuse 06/14/2020   Recurrent major depressive disorder, in partial remission (HCC) 06/09/2020   Anxiety 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 06/09/2020   Alcohol use disorder, moderate, dependence (  HCC) 06/09/2020   Frequent falls 05/23/2020   Foraminal stenosis of cervical region 05/09/2020   Diabetic polyneuropathy    ADHD (attention deficit hyperactivity disorder)    Generalized  anxiety disorder    Major depressive disorder    Cervical spondylosis 09/22/2019   Essential hypertension, benign 01/28/2019   Alcoholic cirrhosis of liver 06/02/2018   Type 2 diabetes mellitus with hyperglycemia 04/25/2018    Conditions to be addressed/monitored per PCP order:   PCP  There are no care plans that you recently modified to display for this patient.   Follow up:  Patient agrees to Care Plan and Follow-up.  Plan: The Managed Medicaid care management team will reach out to the patient again over the next 30 days.  Date/time of next scheduled Social Work care management/care coordination outreach:  07/02/23 Gerald Olson, Gerald Olson, [facility]  [facility] Social Worker [phone]

## 2023-05-30 NOTE — Progress Notes (Signed)
Subjective:    Patient ID: Gerald Olson, male    DOB: 05/22/1966, 57 y.o.   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  Patient reports he had back and neck pain for many years with shooting pain down both legs and altered sensation in his feet.  He also developed diabetic polyneuropathy when he feels like this is his greatest area of pain at this time.  He has chronic burning sensations in his feet that is very severe and has not responded well to oral medications.  Reports his lower back pain and sciatica improved after having his lumbar surgery completed however he continues to have neck pain and he then developed worsening pain in his thoracic spine.  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  His back feels stiff throughout the day and impairs his activities. He reports he has failed multiple medications for his  neuropathy including Topamax, gabapentin, amitriptyline, Cymbalta.  He is not sure if he has used Lyrica in the past.  He uses a natural treatments are better for him and was interested in Qutenza.  He has used tramadol with benefit to his lower back pain, taking it mostly at night because he does not want to have any impairment during the day.  He does take ibuprofen for his back pain with some benefit.  Chiropractic care provided mild benefit to his back pain in the past.  He reports physical therapy did not help and worsened his back pain.  Patient does take occasional cannabis extract for his pain.  He also drinks alcohol 2-3 drinks regularly.   Visit 06/15/22 Gerald Olson is here for follow-up of his chronic pain and completion of Qutenza treatment.  He says he researched Qutenza and has no questions about the procedure.  He continues to have  neck and back pain worse around his thoracic spine felt to be related to DISH.  He has been taking ibuprofen for this pain.  He also uses wild lettuce as a pain treatment.  He has not used tramadol in several months and reports it was not providing much benefit.  He continues to use occasional alcohol and THC products.  Patient reports he has used Voltaren tablets in the past for his back pain and they worked better than ibuprofen.  He reports that he tried Lyrica about 10 years ago for his back pain and it did not provide significant improvement.  He has not tried this for his pain in his feet and is interested in trying this.   Interval History 08/03/2022 Gerald Olson is here for follow-up of his chronic pain.  He reports that Qutenza was beneficial to his pain for several weeks.  He did have some burning about 2 days after application of Qutenza but this has improved.  Burning pain in his feet is returning especially in the mornings and at night.  He uses a CBD balm that helps his foot pain as well.  He also finds that his pain improved with Lyrica 75 mg daily.  He is not having any side effects with this medication.  He has been busy at his house with repair of a water well pump.  He is not able to pick up diclofenac medication due to insurance however he  would like a new prescription for this medication.  Until this time he is using ibuprofen.  He continues to have pain throughout his lower,, mid back and neck.   Interval history 11/02/2022 Patient is here for repeat Qutenza.  Patient reports benefit with prior application of this medicine.  He had some mild discomfort in his feet the day after the procedure, however this resolved with CBD cream.  Lyrica 75 mg continues to provide benefit to his neuropathy pain as well.  He has been using diclofenac intermittently and finds this to be very helpful for his back and neck pain.  He has no questions about the procedure today.   Interval history 03/12/23 Patient  here for follow-up regarding his chronic pain.  He has continued pain in his neck and lower back. Patient stopped using Lyrica for some time but recently restarted it.  Since restarting it he found that it has been helpful.  He does feel like Voltaren is helping his back pain although has been less effective recently.  Patient reports he often starts medicines and I will be helpful for some time.  However eventually he notices a decrease benefit and then switches to different medication.Marland Kitchen  He will often try rotating medications and he often does this with NSAIDs..  Patient reports Qutenza previously did help reduce the pain and tingling in his feet however he delay this treatment until next visit. He currently is having issues with the running water at his house and wants to wait till he gets this fixed. He has cramps in his legs and feels that this has improved after stopping taking lisinopril.  He reports flexeril has helped his cramps previously.    Interval History 05/30/23 Gerald Olson is here for follow-up Qutenza treatment.  He reports he is doing well overall.  Qutenza decreased the shooting and tingling pain in his feet and ankles.  For about 2 months after the last application he did not have any pain, but has slowly started to return.  He says that Qutenza did not help the numbness in his feet, we discussed that it is not expected to have this effect.  He continues to have neck and lower back pain.  He has been adjusting his pillows to help his neck pain.  Lower back pain continues to be severe.  Pain is worse with extending his lumbar spine.  He reports having a prior ESI although this caused his blood glucose to be very high.  He has been rotating through different NSAIDs to try to help his pain. Most recently using ibuprofen.   Pain Inventory Average Pain 6 Pain Right Now 9 My pain is sharp, burning, dull, tingling, and aching  In the last 24 hours, has pain interfered with the  following? General activity 8 Relation with others 2 Enjoyment of life 10 What TIME of day is your pain at its worst? morning , daytime, evening, and night Sleep (in general) Poor  Pain is worse with: walking, bending, sitting, inactivity, and standing Pain improves with: rest Relief from Meds: 3  Family History  Problem Relation Age of Onset   Aneurysm Father    Drug abuse Sister    Multiple sclerosis Sister    Suicidality Maternal Aunt    Emphysema Maternal Grandfather    Diabetes Neg Hx    Social History   Socioeconomic History   Marital status: Single    Spouse name: Not on file   Number of children: Not on file  Years of education: 36   Highest education level: Master's degree (e.g., MA, MS, MEng, MEd, MSW, MBA)  Occupational History   Not on file  Tobacco Use   Smoking status: Former    Current packs/day: 0.00    Types: Cigarettes    Quit date: 06/25/2003    Years since quitting: 19.9   Smokeless tobacco: Never   Tobacco comments:    Currently vapes  Vaping Use   Vaping status: Every Day   Substances: CBD  Substance and Sexual Activity   Alcohol use: Yes    Alcohol/week: 14.0 - 21.0 standard drinks of alcohol    Types: 14 - 21 Cans of beer per week    Comment: 2-3 beers per night on average; sometimes up to 6   Drug use: Yes    Types: Marijuana    Comment: 1/week to help sleep; edibles, does not smoke   Sexual activity: Yes  Other Topics Concern   Not on file  Social History Narrative   Not on file   Social Determinants of Health   Financial Resource Strain: High Risk (05/24/2023)   Overall Financial Resource Strain (CARDIA)    Difficulty of Paying Living Expenses: Very hard  Food Insecurity: Food Insecurity Present (10/18/2022)   Hunger Vital Sign    Worried About Running Out of Food in the Last Year: Sometimes true    Ran Out of Food in the Last Year: Sometimes true  Transportation Needs: No Transportation Needs (02/12/2023)   PRAPARE -  Administrator, Civil Service (Medical): No    Lack of Transportation (Non-Medical): No  Physical Activity: Insufficiently Active (03/25/2023)   Exercise Vital Sign    Days of Exercise per Week: 3 days    Minutes of Exercise per Session: 10 min  Stress: Stress Concern Present (03/18/2023)   [employer] of Occupational Health - Occupational Stress Questionnaire    Feeling of Stress : To some extent  Social Connections: Socially Isolated (05/24/2023)   Social Connection and Isolation Panel [NHANES]    Frequency of Communication with Friends and Family: More than three times a week    Frequency of Social Gatherings with Friends and Family: More than three times a week    Attends Religious Services: Never    Database administrator or Organizations: No    Attends Banker Meetings: Never    Marital Status: Never married   Past Surgical History:  Procedure Laterality Date   CHOLECYSTECTOMY N/A 06/02/2018   Procedure: LAPAROSCOPIC CHOLECYSTECTOMY;  Surgeon: Ancil Linsey, MD;  Location: [facility] ORS;  Service: General;  Laterality: N/A;   KNEE ARTHROSCOPY     30 Years ago   lumbar five-sacral one posterior lumbar interbody fusion  01/24/2021   Past Surgical History:  Procedure Laterality Date   CHOLECYSTECTOMY N/A 06/02/2018   Procedure: LAPAROSCOPIC CHOLECYSTECTOMY;  Surgeon: Ancil Linsey, MD;  Location: [facility] ORS;  Service: General;  Laterality: N/A;   KNEE ARTHROSCOPY     30 Years ago   lumbar five-sacral one posterior lumbar interbody fusion  01/24/2021   Past Medical History:  Diagnosis Date   ADHD (attention deficit hyperactivity disorder)    Diagnosed during teenage years   Alcohol use disorder    On average 14-21 drinks per week   Alcoholic cirrhosis of liver    Per patient - condition is not related to moderate alcohol use but rather prolonged NSAID use and recent abuse.   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 [place]   Major depressive disorder    Other spondylosis with radiculopathy, cervical region    Retinopathy    Right eye; per patient report   Type 2 diabetes mellitus with hyperglycemia    BP (!) 130/90   Pulse 98   Ht 6\' 3"  (1.905 m)   Wt 233 lb 12.8 oz (106.1 kg)   SpO2 99%   BMI 29.22 kg/m   Opioid Risk Score:   Fall Risk Score:  `1  Depression screen PHQ 2/9     05/30/2023    2:50 PM 03/11/2023    3:44 PM 11/02/2022    3:03 PM 06/15/2022    1:32 PM 05/18/2022    2:43 PM 10/17/2021    1:17 PM 09/20/2021    3:27 PM  Depression screen PHQ 2/9  Decreased Interest 3 1 0 3 3  3   Down, Depressed, Hopeless 3 1 0 3 3  3   PHQ - 2 Score 6 2 0 6 6  6   Altered sleeping     3  3  Tired, decreased energy     3  3  Change in appetite     3  3  Feeling bad or failure about yourself      3  3  Trouble concentrating     2  3  Moving slowly or fidgety/restless     1  0  Suicidal thoughts     0  1  PHQ-9 Score     21  22  Difficult doing work/chores     Somewhat difficult  Not difficult at all     Information is confidential and restricted. Go to Review Flowsheets to unlock data.    Review of Systems  Musculoskeletal:  Positive for back pain and neck pain.       Pain in both knees, pain in left foot & right shoulder  All other systems reviewed and are negative.      Objective:   Physical Exam  Gen: no distress, normal appearing HEENT: oral mucosa pink and moist, NCAT Cardio: Reg rate Chest: normal effort, normal rate of breathing Abd: soft, non-distended Ext: no edema Psych: pleasant, normal affect Skin: warm and dry neuro: Alert , normal  speech language, cranial nerves II through XII grossly intact  Sensation decreased to light touch in a stocking glove distribution in bilateral lower extremities below the knees Strength  intact to gravity and resistance musculoskeletal:   Decreased motion at the lumbar and cervical spine in all directions Tenderness to palpation L spine paraspinal muscles greatest at upper R level Pain with L spine extension Facet loading postive     L spine 01/24/2021 Intraoperative images document instrumented posterolateral and interbody PLIF L5-S1. The L5 pedicle screws are directed towards the superior endplate. Graft markers project in the interspace. Alignment preserved..   IMPRESSION: Limited intraoperative lateral view of the lumbar spine obtained for localization purposes     L spine MRI 11/22/2020 COMPARISON:  MRI lumbar spine July 20, 2018.   FINDINGS: Segmentation:  Standard.   Alignment:  Grade 1 anterolisthesis of L5 on S1, similar to prior.   Vertebrae: Vertebral body heights are maintained. No specific evidence of acute fracture, discitis/osteomyelitis, or suspicious bone lesion. Degenerative/discogenic endplate signal changes about the right eccentric L5-S1 disc.   Conus medullaris and cauda equina: Conus extends to the T12-L1 level. Conus appears normal.   Paraspinal and other soft tissues: Unremarkable.   Disc levels:   T12-L1: Only imaged sagittally. Disc bulging and facet hypertrophy without evidence of significant canal or foraminal stenosis.   L1-L2: Small broad disc bulge with small superimposed left subarticular disc protrusion. Mild facet hypertrophy. No significant canal or foraminal stenosis.   L2-L3: No significant disc protrusion, foraminal stenosis, or canal stenosis.   L3-L4: Similar left foraminal/far lateral disc protrusion which contacts the exiting/exited left L3 nerve root without displacement (series 8, image 20). No significant canal or  foraminal stenosis.   L4-L5: Broad disc bulge with superimposed left foraminal disc protrusion with annular fissure. Mild bilateral subarticular recess stenosis. No significant central canal stenosis. Mild left foraminal stenosis, similar to prior. No significant right foraminal stenosis.   L5-S1: Grade 1 anterolisthesis of L5 on S1. Uncovering of the disc with superimposed disc bulge and bilateral foraminal disc protrusions. Severe bilateral facet arthropathy/hypertrophy. Similar right greater than left subarticular recess stenosis with possible impingement of the descending right S1 nerve root. Similar moderate bilateral foraminal stenosis.   IMPRESSION: 1. Similar grade 1 anterolisthesis of L5 on S1 with right greater than left subarticular recess stenosis, possible impingement of the descending right S1 nerve root, and moderate right greater than left foraminal stenosis. 2. Similar disc bulge with annular fissure on the left at L4-L5 with mild left foraminal stenosis and mild bilateral subarticular recess stenosis. 3. Similar left foraminal/far lateral disc protrusion at L3-L4 which contacts the exiting/exited left L3 nerve root, unchanged.       C spine MRI 05/29/2020 FINDINGS: Alignment: Straightening of the normal cervical lordosis. One or 2 mm of degenerative anterolisthesis at C7-T1.   Vertebrae: No fracture or primary bone lesion.   Cord: No primary cord lesion.  See below regarding stenosis.   Posterior Fossa, vertebral arteries, paraspinal tissues: Negative   Disc levels:   Foramen magnum is widely patent. No significant finding at the C1-2 level.   C2-3: Endplate osteophytes and bulging of the disc. No compressive central canal stenosis. Mild foraminal narrowing on the right.   C3-4: Endplate osteophytes and bulging of the disc. AP diameter of the canal in the midline 9.3 mm. Bilateral foraminal encroachment right worse than left. Either C4 nerve could be  affected.   C4-5: Endplate osteophytes and bulging of the disc. AP diameter of the canal in the midline 7.6 mm. Slight triangulation of the cord without abnormal cord signal. Bilateral bony foraminal stenosis could affect either C5 nerve.   C5-6: Spondylosis with endplate osteophytes and protruding disc material more prominent towards the left. AP diameter of the canal in the midline 6.8 mm. Effacement of the subarachnoid space and some cord deformity particularly on the left. No conclusive abnormal T2 signal within the cord. Bilateral foraminal stenosis could compress either or both C6 nerves.   C6-7: Endplate osteophytes and bulging of the disc. AP diameter of the canal in the midline 8.2 mm. No cord deformity. Bilateral foraminal stenosis left worse than right. Either C7 nerve could be affected, particularly the left.   C7-T1: Bilateral facet arthropathy with 1 or 2 mm of anterolisthesis. No disc  pathology. No compressive canal stenosis. Bilateral foraminal stenosis could affect either C8 nerve. Edematous change of the facet joints could certainly relate to regional pain.   IMPRESSION: C2-3: Mild foraminal narrowing on the right.   C3-4: Spondylosis with bilateral foraminal narrowing right worse than left. Either C4 nerve could be affected.   C4-5: Spondylosis with bilateral foraminal narrowing. Either C5 nerve could be affected. Canal narrowing with AP diameter of 7.6 mm. Slight deformity of the cord without abnormal cord signal.   C5-6: Spondylosis with bilateral foraminal narrowing. Findings more pronounced on the left. Either C6 nerve could be affected. Canal narrowing worse on the left with deformity of the cord. I do not see conclusive T2 signal within the cord. AP diameter of the canal in the midline only 6.8 mm. The patient could be at risk of developing myelopathy at this level.   C6-7: Spondylosis with bilateral foraminal narrowing. Either C7 nerve could be  affected. Somewhat worse on the left.   C7-T1: Facet arthropathy with 1 or 2 mm of anterolisthesis and edematous change. Bilateral foraminal stenosis could affect either C8 nerve. The facet arthropathy is likely painful.          Assessment & Plan:   Diabetic polyneuropathy due to diabetes mellitus -Continue Lyrica 100 mg twice daily, advised trying to take this regularly -4 patch of Qutenza was applied to the area of pain. Ice packs were applied during the procedure to ensure patient comfort. Blood pressure was monitored every 15 minutes. The patient tolerated the procedure well. Post-procedure instructions were given and follow-up has been scheduled.     Back and neck pain.  Imaging has shown lumbar and cervical spondylosis and he is status post PLIF L5-S1.  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  -Prior use of tramadol however currently uses THC and alcohol products -Advised decreasing ibuprofen dose/frequency to reduce renal risk, discussed not combining more than 1 NSAID -Ambulatory referral to PT for back pain -Consider MRI repeat L spine if pain continues after PT -Pt ask for ambulatory referral to Neurosurgery- ordered -Consider repeat MRI L spine  -Consider facet joint injections L spine at a later time  Muscle cramps -Continue magnesium supplement -Previously ordered flexeril 5mg  TID PRN #20

## 2023-05-30 NOTE — Patient Instructions (Signed)
Visit Information  [name] was given information about Medicaid Managed Care team care coordination services as a part of their Healthy [facility] Medicaid benefit. Gerald Olson verbally consented to engagement with the [facility] Managed Care team.   If you are experiencing a medical emergency, please call 911 or report to your local emergency department or urgent care.   If you have a non-emergency medical problem during routine business hours, please contact your provider's office and ask to speak with a nurse.   For questions related to your Healthy [facility] health plan, please call: [phone] or visit the homepage here: [email or web address]  If you would like to schedule transportation through your Healthy [facility] plan, please call the following number at least 2 days in advance of your appointment: [phone]  For information about your ride after you set it up, call Ride Assist at [phone]. Use this number to activate a Will Call pickup, or if your transportation is late for a scheduled pickup. Use this number, too, if you need to make a change or cancel a previously scheduled reservation.  If you need transportation services right away, call [phone]. The after-hours call center is staffed 24 hours to handle ride assistance and urgent reservation requests (including discharges) 365 days a year. Urgent trips include sick visits, hospital discharge requests and life-sustaining treatment.  Call the [facility] Line at [phone], at any time, 24 hours a day, 7 days a week. If you are in danger or need immediate medical attention call 911.  If you would like help to quit smoking, call 1-800-QUIT-NOW (819-741-8329) OR Espaol: 1-855-Djelo-Ya (1-607-371-0626) o para ms informacin haga clic aqu or Text READY to 948-546 to register via text  Mr. Meckes - following are the goals we discussed in your visit today:   Goals  Addressed   None     Social Worker will follow up in 30 days.   Gerald Olson, Gerald Olson, MHA [facility]  Managed Medicaid Social Worker [phone]   Following is a copy of your plan of care:  There are no care plans that you recently modified to display for this patient.

## 2023-06-04 ENCOUNTER — Encounter: Payer: Self-pay | Admitting: Internal Medicine

## 2023-06-04 ENCOUNTER — Other Ambulatory Visit (HOSPITAL_COMMUNITY): Payer: Self-pay

## 2023-06-04 ENCOUNTER — Ambulatory Visit: Payer: Medicaid Other | Admitting: Internal Medicine

## 2023-06-04 VITALS — BP 142/80 | HR 97 | Ht 75.0 in | Wt 233.2 lb

## 2023-06-04 DIAGNOSIS — E1165 Type 2 diabetes mellitus with hyperglycemia: Secondary | ICD-10-CM

## 2023-06-04 DIAGNOSIS — Z7984 Long term (current) use of oral hypoglycemic drugs: Secondary | ICD-10-CM

## 2023-06-04 DIAGNOSIS — Z794 Long term (current) use of insulin: Secondary | ICD-10-CM

## 2023-06-04 DIAGNOSIS — E785 Hyperlipidemia, unspecified: Secondary | ICD-10-CM | POA: Diagnosis not present

## 2023-06-04 LAB — POCT GLYCOSYLATED HEMOGLOBIN (HGB A1C): Hemoglobin A1C: 6.7 % — AB (ref 4.0–5.6)

## 2023-06-04 MED ORDER — DAPAGLIFLOZIN PROPANEDIOL 5 MG PO TABS
5.0000 mg | ORAL_TABLET | Freq: Every day | ORAL | 3 refills | Status: DC
  Filled 2023-06-04: qty 90, 90d supply, fill #0
  Filled 2023-10-08: qty 90, 90d supply, fill #1
  Filled 2024-01-08: qty 90, 90d supply, fill #2
  Filled 2024-04-02: qty 90, 90d supply, fill #3

## 2023-06-04 MED ORDER — LANTUS SOLOSTAR 100 UNIT/ML ~~LOC~~ SOPN
7.0000 [IU] | PEN_INJECTOR | SUBCUTANEOUS | 3 refills | Status: DC
Start: 2023-06-04 — End: 2024-06-04
  Filled 2023-06-04: qty 9, 84d supply, fill #0
  Filled 2023-10-08 – 2024-01-08 (×2): qty 9, 84d supply, fill #1
  Filled 2024-04-02: qty 9, 84d supply, fill #2

## 2023-06-04 NOTE — Patient Instructions (Addendum)
Please continue: - Farxiga 5 mg daily in am  Start: - Lantus 7 units daily in am  Please return in 4-6 months with your sugar log.

## 2023-06-04 NOTE — Progress Notes (Signed)
Patient ID: Gerald Olson, male   DOB: 1966/04/19, 57 y.o.   MRN: [id number]  HPI: Gerald Olson is a 57 y.o.-year-old male, returning for follow-up for DM2 dx in 2019, with prediabetes 2006, insulin-dependent since 2020, uncontrolled, with complications (peripheral artery disease, diabetic retinopathy, peripheral neuropathy). Pt. previously saw Dr. Everardo All, but last visit with me 7 months ago.  Interim history: No increased urination, blurry vision, nausea, chest pain. He has back pain-seen in the pain clinic.   Reviewed HbA1c: Lab Results  Component Value Date   HGBA1C 6.4 (A) 11/08/2022   HGBA1C 7.4 (A) 08/09/2022   HGBA1C 6.6 (A) 04/04/2022   HGBA1C 6.1 (A) 01/16/2022   HGBA1C 6.0 (A) 12/26/2021   HGBA1C 6.0 (A) 09/22/2021   HGBA1C 5.9 (A) 06/19/2021   HGBA1C 5.9 (A) 04/19/2021   HGBA1C 7.2 (H) 01/18/2021   HGBA1C 7.0 (A) 09/05/2020   Pt is on a regimen of: - Lantus 7 units in  am 6/7 days >> daily >> off >> restarted >> out now - Farxiga 5 mg daily in a.m.-restarted 07/2022  Pt checks his blood sugars 0 to once a day - am: 112, 142 >> 92, 110-130, 170-180 >> 108, 130-171, 221 (after white bread the night before) - 2h after b'fast: n/c - before lunch: n/c - 2h after lunch: n/c - before dinner: 160-190 >> n/c >> 130-140, 150 - 2h after dinner: n/c - bedtime: n/c - nighttime: n/c Lowest sugar was 110 >> 92 >> 108; he has hypoglycemia awareness at 90.  Highest sugar was 190 >> 181 >> 221.  Glucometer: Accu-Chek guide  Pt's meals are: - Breakfast: none - Lunch: none - Dinner: canned soup, hamburger helper or other meat, rice, beans, corn - Snacks: tea with stevia  - + CKD - saw [facility], last BUN/creatinine:  Lab Results  Component Value Date   BUN 13 03/05/2023   BUN 9 10/04/2022   CREATININE 0.82 03/05/2023   CREATININE 0.73 10/04/2022   Lab Results  Component Value Date   MICRALBCREAT 4 01/16/2022   MICRALBCREAT 11.6 08/06/2018  07/13/2021: 0.407  (0.025 - 0.148 mg/mg creat)  -+ Dyslipidemia; last set of lipids: Lab Results  Component Value Date   CHOL 101 10/04/2022   HDL 29 (L) 10/04/2022   LDLCALC 56 10/04/2022   TRIG 78 10/04/2022   CHOLHDL 3.5 10/04/2022  He is not on a statin.  - last eye exam was on 05/2022. + NPDR OU, without macular edema.  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  - + numbness and tingling in his feet.  Last foot exam 11/08/2022. He is taking collagen + peptides + retinol. He also takes B vitamins with Benfotiamine for neuropathy.  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  ROS: + see HPI  Past Medical History:  Diagnosis Date   ADHD (attention deficit hyperactivity disorder)    Diagnosed during teenage years   Alcohol use disorder    On average 14-21 drinks per week   Alcoholic cirrhosis of liver    Per patient - condition is not related to moderate alcohol use but rather prolonged NSAID use and recent abuse.   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[place]   Major depressive disorder    Other spondylosis with radiculopathy, cervical region    Retinopathy    Right eye; per patient report   Type 2 diabetes mellitus with hyperglycemia    Past Surgical History:  Procedure Laterality Date   CHOLECYSTECTOMY N/A 06/02/2018   Procedure: LAPAROSCOPIC CHOLECYSTECTOMY;  Surgeon: Ancil Linsey, MD;  Location: [facility] ORS;  Service: General;  Laterality: N/A;   KNEE ARTHROSCOPY     30 Years ago   lumbar five-sacral one  posterior lumbar interbody fusion  01/24/2021   Social History   Socioeconomic History   Marital status: Single    Spouse name: Not on file   Number of children: Not on file   Years of education: 18   Highest education level: Master's degree (e.g., MA, MS, MEng, MEd, MSW, MBA)  Occupational History   Not on file  Tobacco Use   Smoking status: Former    Current packs/day: 0.00    Types: Cigarettes    Quit date: 06/25/2003    Years since quitting: 19.9   Smokeless tobacco: Never   Tobacco comments:    Currently vapes  Vaping Use   Vaping status: Every Day   Substances: CBD  Substance and Sexual Activity   Alcohol use: Yes    Alcohol/week: 14.0 - 21.0 standard drinks of alcohol    Types: 14 - 21 Cans of beer per week    Comment: 2-3 beers per night on average; sometimes up to 6   Drug use: Yes    Types: Marijuana    Comment: 1/week to help sleep; edibles, does not smoke   Sexual activity: Yes  Other Topics Concern   Not on file  Social History Narrative   Not on file   Social Determinants of Health   Financial Resource Strain: High Risk (05/24/2023)   Overall Financial Resource Strain (CARDIA)    Difficulty of Paying Living Expenses: Very hard  Food Insecurity: Food Insecurity Present (10/18/2022)   Hunger Vital Sign    Worried About Running Out of Food in the Last Year: Sometimes true    Ran Out of Food in the Last Year: Sometimes true  Transportation Needs: No Transportation Needs (02/12/2023)   PRAPARE - Administrator, Civil Service (Medical): No    Lack of Transportation (Non-Medical): No  Physical Activity: Insufficiently Active (03/25/2023)   Exercise Vital Sign    Days of Exercise per Week: 3 days    Minutes of Exercise per Session: 10 min  Stress: Stress Concern Present (03/18/2023)   [employer] of Occupational Health - Occupational Stress Questionnaire    Feeling of Stress : To some extent  Social Connections: Socially Isolated (05/24/2023)    Social Connection and Isolation Panel [NHANES]    Frequency of Communication with Friends and Family: More than three times a week    Frequency of Social Gatherings with Friends and Family: More than three times a week    Attends Religious Services: Never    Database administrator or Organizations: No    Attends Banker Meetings: Never    Marital Status: Never married  Intimate Partner Violence: Not At Risk (01/23/2023)   Humiliation, Afraid, Rape, and Kick questionnaire    Fear of Current  or Ex-Partner: No    Emotionally Abused: No    Physically Abused: No    Sexually Abused: No   Current Outpatient Medications on File Prior to Visit  Medication Sig Dispense Refill   Blood Pressure Monitoring (BLOOD PRESSURE MONITOR AUTOMAT) DEVI Check blood pressure in AM and PM daily 1 each 0   cyclobenzaprine (FLEXERIL) 5 MG tablet Take 1 tablet (5 mg total) by mouth 3 (three) times daily as needed for muscle spasms. 20 tablet 0   dapagliflozin propanediol (FARXIGA) 5 MG TABS tablet Take 1 tablet (5 mg total) by mouth daily before breakfast. 90 tablet 3   glucose blood (ACCU-CHEK GUIDE) test strip USE AS DIRECTED 2 TIMES DAILY 180 strip 0   imatinib (GLEEVEC) 400 MG tablet Take 1 tablet (400 mg total) by mouth daily. 30 tablet 5   insulin glargine (LANTUS SOLOSTAR) 100 UNIT/ML Solostar Pen Inject 7 Units into the skin every morning. 15 mL 3   Insulin Pen Needle (TECHLITE PEN NEEDLES) 32G X 4 MM MISC Use to inject Victoza once a day 100 each 11   lisinopril (ZESTRIL) 20 MG tablet Take 1 tablet (20 mg total) by mouth daily. 90 tablet 3   pregabalin (LYRICA) 100 MG capsule Take 1 capsule (100 mg total) by mouth 2 (two) times daily. 60 capsule 6   [DISCONTINUED] DULoxetine (CYMBALTA) 60 MG capsule Take 1 capsule (60 mg total) by mouth 2 (two) times daily. (Patient not taking: Reported on 08/09/2020) 60 capsule 1   No current facility-administered medications on file prior to visit.   No  Known Allergies Family History  Problem Relation Age of Onset   Aneurysm Father    Drug abuse Sister    Multiple sclerosis Sister    Suicidality Maternal Aunt    Emphysema Maternal Grandfather    Diabetes Neg Hx    PE: BP (!) 142/80   Pulse 97   Ht 6\' 3"  (1.905 m)   Wt 233 lb 3.2 oz (105.8 kg)   SpO2 98%   BMI 29.15 kg/m  Wt Readings from Last 3 Encounters:  06/04/23 233 lb 3.2 oz (105.8 kg)  05/30/23 233 lb 12.8 oz (106.1 kg)  03/11/23 232 lb (105.2 kg)   Constitutional: overweight, in NAD Eyes: no exophthalmos ENT: no thyromegaly, no cervical lymphadenopathy Cardiovascular: Tachycardia, RR, No MRG Respiratory: CTA B Musculoskeletal: no deformities Skin: no rashes Neurological: no tremor with outstretched hands  ASSESSMENT: 1. DM2, insulin-dependent, uncontrolled, with complications - PAD - DR - PN  2.  Dyslipidemia  PLAN:  1. Patient with longstanding, uncontrolled, type 2 diabetes, on oral antidiabetic regimen with SGLT2 inhibitor and also low-dose long-acting insulin.  He was out of Lantus for 2 weeks at last visit after spacing it out and only taking it approximately 3 days a week.  He did notice that the sugars were higher after he stopped the Lantus completely.  I advised him to access and continued his Marcelline Deist last visit.  At that time, HbA1c was 6.4%, lower than before. -At today's visit, sugars are higher than target in the morning and occasionally later in the day.  Upon questioning, he is taking Lantus on an as-needed basis, only when sugars are higher in the morning.  He also ran out of it in the last few days.  We discussed about restarted Lantus and taking it consistently especially with the holidays coming up.  I will also continue his [place].  I refilled both. - I suggested to:  Patient  Instructions  Please continue: - Farxiga 5 mg daily in am  Start: - Lantus 7 units daily in am  Please return in 4-6 months with your sugar log.   - we checked  his HbA1c: 6.7% (higher) - advised to check sugars at different times of the day - 1x a day, rotating check times - advised for yearly eye exams >> he is UTD - will check an ACR today - return to clinic in 4-6 months  2.  Dyslipidemia -Reviewed latest lipid panel from 09/2022: Fractions at goal with the exception of a low HDL: Lab Results  Component Value Date   CHOL 101 10/04/2022   HDL 29 (L) 10/04/2022   LDLCALC 56 10/04/2022   TRIG 78 10/04/2022   CHOLHDL 3.5 10/04/2022  -He is not on a statin.  Carlus Pavlov, MD PhD [facility]

## 2023-06-05 ENCOUNTER — Other Ambulatory Visit: Payer: Self-pay

## 2023-06-05 LAB — MICROALBUMIN / CREATININE URINE RATIO
Creatinine,U: 63.4 mg/dL
Microalb Creat Ratio: 1.1 mg/g (ref 0.0–30.0)
Microalb, Ur: <0.7 mg/dL (ref 0.0–1.9)

## 2023-06-06 ENCOUNTER — Other Ambulatory Visit: Payer: Self-pay

## 2023-06-06 NOTE — Addendum Note (Signed)
Addended by: Pollie Meyer on: 06/06/2023 08:51 AM   Modules accepted: Orders

## 2023-06-11 ENCOUNTER — Other Ambulatory Visit: Payer: Self-pay | Admitting: *Deleted

## 2023-06-11 DIAGNOSIS — C9210 Chronic myeloid leukemia, BCR/ABL-positive, not having achieved remission: Secondary | ICD-10-CM

## 2023-06-12 ENCOUNTER — Inpatient Hospital Stay: Payer: Medicaid Other | Attending: Oncology

## 2023-06-12 DIAGNOSIS — C9211 Chronic myeloid leukemia, BCR/ABL-positive, in remission: Secondary | ICD-10-CM | POA: Diagnosis not present

## 2023-06-12 DIAGNOSIS — C9210 Chronic myeloid leukemia, BCR/ABL-positive, not having achieved remission: Secondary | ICD-10-CM

## 2023-06-12 LAB — CBC WITH DIFFERENTIAL (CANCER CENTER ONLY)
Abs Immature Granulocytes: 0.01 10*3/uL (ref 0.00–0.07)
Basophils Absolute: 0.1 10*3/uL (ref 0.0–0.1)
Basophils Relative: 1 %
Eosinophils Absolute: 0.1 10*3/uL (ref 0.0–0.5)
Eosinophils Relative: 2 %
HCT: 38.9 % — ABNORMAL LOW (ref 39.0–52.0)
Hemoglobin: 12.2 g/dL — ABNORMAL LOW (ref 13.0–17.0)
Immature Granulocytes: 0 %
Lymphocytes Relative: 25 %
Lymphs Abs: 1.2 10*3/uL (ref 0.7–4.0)
MCH: 26.5 pg (ref 26.0–34.0)
MCHC: 31.4 g/dL (ref 30.0–36.0)
MCV: 84.4 fL (ref 80.0–100.0)
Monocytes Absolute: 0.5 10*3/uL (ref 0.1–1.0)
Monocytes Relative: 11 %
Neutro Abs: 3.1 10*3/uL (ref 1.7–7.7)
Neutrophils Relative %: 61 %
Platelet Count: 249 10*3/uL (ref 150–400)
RBC: 4.61 MIL/uL (ref 4.22–5.81)
RDW: 15.2 % (ref 11.5–15.5)
WBC Count: 5 10*3/uL (ref 4.0–10.5)
nRBC: 0 % (ref 0.0–0.2)

## 2023-06-12 LAB — CMP (CANCER CENTER ONLY)
ALT: 38 U/L (ref 0–44)
AST: 45 U/L — ABNORMAL HIGH (ref 15–41)
Albumin: 3.7 g/dL (ref 3.5–5.0)
Alkaline Phosphatase: 65 U/L (ref 38–126)
Anion gap: 7 (ref 5–15)
BUN: 11 mg/dL (ref 6–20)
CO2: 25 mmol/L (ref 22–32)
Calcium: 8.5 mg/dL — ABNORMAL LOW (ref 8.9–10.3)
Chloride: 104 mmol/L (ref 98–111)
Creatinine: 0.93 mg/dL (ref 0.61–1.24)
GFR, Estimated: >60 mL/min (ref >60)
Glucose, Bld: 175 mg/dL — ABNORMAL HIGH (ref 70–99)
Potassium: 4.3 mmol/L (ref 3.5–5.1)
Sodium: 136 mmol/L (ref 135–145)
Total Bilirubin: 0.6 mg/dL (ref 0.3–1.2)
Total Protein: 6.4 g/dL — ABNORMAL LOW (ref 6.5–8.1)

## 2023-06-12 LAB — PHOSPHORUS: Phosphorus: 2.8 mg/dL (ref 2.5–4.6)

## 2023-06-12 LAB — MAGNESIUM: Magnesium: 2.4 mg/dL (ref 1.7–2.4)

## 2023-06-18 ENCOUNTER — Ambulatory Visit: Payer: Medicaid Other | Attending: Physical Medicine & Rehabilitation

## 2023-06-18 ENCOUNTER — Other Ambulatory Visit: Payer: Self-pay

## 2023-06-18 DIAGNOSIS — M47816 Spondylosis without myelopathy or radiculopathy, lumbar region: Secondary | ICD-10-CM | POA: Insufficient documentation

## 2023-06-18 DIAGNOSIS — R2689 Other abnormalities of gait and mobility: Secondary | ICD-10-CM | POA: Diagnosis not present

## 2023-06-18 DIAGNOSIS — M6281 Muscle weakness (generalized): Secondary | ICD-10-CM | POA: Diagnosis not present

## 2023-06-18 DIAGNOSIS — M542 Cervicalgia: Secondary | ICD-10-CM | POA: Diagnosis not present

## 2023-06-18 DIAGNOSIS — M5459 Other low back pain: Secondary | ICD-10-CM | POA: Diagnosis not present

## 2023-06-18 LAB — BCR-ABL1, CML/ALL, PCR, QUANT
E1A2 Transcript: <0.0032 %
Interpretation (BCRAL):: NEGATIVE
b2a2 transcript: <0.0032 %
b3a2 transcript: <0.0032 %

## 2023-06-18 NOTE — Therapy (Signed)
OUTPATIENT PHYSICAL THERAPY THORACOLUMBAR EVALUATION   Patient Name: Gerald Olson MRN: [id number] DOB:08-Sep-1965, 57 y.o., male Today's Date: 06/19/2023  END OF SESSION:  PT End of Session - 06/19/23 0810     Visit Number 1    Number of Visits 17    Date for PT Re-Evaluation 08/13/23    Authorization Type [place] [employer]    PT Start Time 1448    PT Stop Time 1528    PT Time Calculation (min) 40 min    Activity Tolerance Patient tolerated treatment well    Behavior During Therapy WFL for tasks assessed/performed             Past Medical History:  Diagnosis Date   ADHD (attention deficit hyperactivity disorder)    Diagnosed during teenage years   Alcohol use disorder    On average 14-21 drinks per week   Alcoholic cirrhosis of liver    Per patient - condition is not related to moderate alcohol use but rather prolonged NSAID use and recent abuse.   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[place]   Major depressive disorder    Other spondylosis with radiculopathy, cervical region    Retinopathy    Right eye; per patient report   Type 2 diabetes mellitus with hyperglycemia    Past Surgical History:  Procedure Laterality Date   CHOLECYSTECTOMY N/A 06/02/2018   Procedure: LAPAROSCOPIC CHOLECYSTECTOMY;  Surgeon: Ancil Linsey, MD;  Location: [facility] ORS;  Service: General;  Laterality: N/A;   KNEE ARTHROSCOPY     30 Years ago   lumbar five-sacral one posterior lumbar interbody fusion  01/24/2021   Patient Active Problem List   Diagnosis Date Noted   Onychomycosis 09/21/2022   Other fatigue 09/20/2021    Spondylolisthesis at L5-S1 level 01/24/2021   Cervical radicular pain 10/26/2020   Lumbar facet arthropathy 10/26/2020   Lumbar degenerative disc disease 10/26/2020   Opiate misuse 10/26/2020   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 08/09/2020   Mesenteric lymphadenopathy 06/14/2020   Splenomegaly- mild  06/14/2020   History of cannabis abuse 06/14/2020   Recurrent major depressive disorder, in partial remission (HCC) 06/09/2020   Anxiety 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 06/09/2020   Alcohol use disorder, moderate, dependence (HCC) 06/09/2020   Frequent falls 05/23/2020   Foraminal stenosis of cervical region 05/09/2020   Diabetic polyneuropathy    ADHD (attention deficit hyperactivity disorder)    Generalized anxiety disorder    Major depressive disorder    Cervical spondylosis 09/22/2019   Essential hypertension, benign 01/28/2019   Alcoholic cirrhosis of liver 06/02/2018   Type 2 diabetes mellitus with hyperglycemia 04/25/2018    PCP: No PCP  REFERRING PROVIDER: Fanny Dance, MD   REFERRING DIAG: 7202724219 (ICD-10-CM) - Lumbar spondylosis   Rationale for Evaluation and Treatment: Rehabilitation  THERAPY DIAG:  Cervicalgia  Other low back pain  Muscle weakness (generalized)  Other abnormalities of gait and mobility  ONSET DATE: Chronic  SUBJECTIVE:  SUBJECTIVE STATEMENT: Pt presents to PT with reports of chronic cervical, thoracic, and lumbar pain. Pt has complex PMH with lumbar fusion and DISH in thoracic spine, notes continued increase in stiffness over last few years. Prior to lumbar fusion pt was getting significant neurogenic change in LE but this resolved after surgery. Unfortunately he has noted symptoms of lower back pain and LE  weakness/fatigue have started to return, feels like he can only perform 30 minutes standing activity and 10 minutes of walking.   PERTINENT HISTORY:  Lumbar fusion, DISH, DM II with polyneuropathy, HTN  PAIN:  Are you having pain?  Yes: NPRS scale: 7/10 Pain location: cervical spine Pain description: sharp, tight Aggravating factors: rotation, driving, lifting Relieving factors: medication  Are you having pain?  Yes: NPRS scale: 3/10 Pain location: thoracic spine Pain description: sharp Aggravating factors: bending, lifting Relieving factors: medication, inversion table  Are you having pain?  Yes: NPRS scale: 7/10 Pain location: lumbar spine Pain description: sharp, dull ache Aggravating factors: medication Relieving factors: medication, inversion  PRECAUTIONS: None  RED FLAGS: None   WEIGHT BEARING RESTRICTIONS: No  FALLS:  Has patient fallen in last 6 months? No  LIVING ENVIRONMENT: Lives with: lives alone Lives in: House/apartment Stairs: Yes Has following equipment at home: None  OCCUPATION: Not working  PLOF: Independent  PATIENT GOALS: decrease pain, improve mobility and get back to doing things more comfortably  NEXT MD VISIT: November - unsure of exact date  OBJECTIVE:  Note: Objective measures were completed at Evaluation unless otherwise noted.  DIAGNOSTIC FINDINGS:  See imaging   PATIENT SURVEYS:  FOTO: 37% function; 48% predicted  COGNITION: Overall cognitive status: Within functional limits for tasks assessed     SENSATION: Light touch: Impaired - distal extremities due to neuropaty  MUSCLE LENGTH: Hamstrings: DNT Thomas test: DNT  POSTURE: rounded shoulders, forward head, and flexed trunk   PALPATION: TTP to upper trap, lumbar paraspinals   CERVICAL ROM:   AROM eval  Flexion   Extension   Right lateral flexion   Left lateral flexion   Right rotation 32  Left rotation 46   (Blank rows = not tested)  LOWER EXTREMITY  MMT:    MMT Right eval Left eval  Hip flexion 4/5 4/5  Hip extension    Hip abduction 4/5 4/5  Hip adduction    Hip internal rotation    Hip external rotation    Knee flexion 4/5 4/5  Knee extension 4/5 4/5  Ankle dorsiflexion    Ankle plantarflexion    Ankle inversion    Ankle eversion     (Blank rows = not tested)  LUMBAR SPECIAL TESTS:  Slump test: Positive  FUNCTIONAL TESTS:  Five Time Sit to Stand: 30 seconds  GAIT: Distance walked: 23ft Assistive device utilized: None Level of assistance: Complete Independence Comments: antaglic gait  TREATMENT: [facility] Adult PT Treatment:                                                DATE: 06/18/2023 Therapeutic Exercise: Upper trap and levator stretch x 30" each Seated chin tuck x 10 Seated bilateral ER x 10 - GTB  PATIENT EDUCATION:  Education details: eval findings, FOTO, HEP, POC Person educated: Patient Education method: Explanation, Demonstration, and Handouts Education comprehension: verbalized understanding and returned demonstration  HOME EXERCISE PROGRAM: Access Code: [email or web address]  URL: [email or web address] Date: 06/18/2023 Prepared by: Edwinna Areola  Exercises - Seated Upper Trapezius Stretch  - 1 x daily - 7 x weekly - 2 reps - 30 sec hold - Seated Levator Scapulae Stretch  - 1 x daily - 7 x weekly - 2 reps - 30 sec hold - Seated Cervical Retraction  - 1 x daily - 7 x weekly - 2 sets - 10 reps - 5 sec hold - Shoulder External Rotation and Scapular Retraction with Resistance  - 1 x daily - 7 x weekly - 3 sets - 10 reps - green band hold  ASSESSMENT:  CLINICAL IMPRESSION: Patient is a 57 y.o. M who was seen today for physical therapy evaluation and treatment for chronic LBP along with chronic thoracic and cervical discomfort. Physical findings are consistent with MD impression as pt demonstrates decrease in core/hip strength and general functional mobility as well as thoracic stiffness and decreased  cervical ROM. FOTO score demonstrates decrease in subjective functional ability below PLOF. Pt would benefit from skilled PT services working on improving strength and decreasing pain.    OBJECTIVE IMPAIRMENTS: Abnormal gait, decreased activity tolerance, decreased balance, decreased endurance, decreased mobility, difficulty walking, decreased ROM, decreased strength, impaired UE functional use, postural dysfunction, and pain.   ACTIVITY LIMITATIONS: carrying, lifting, bending, sitting, standing, squatting, sleeping, stairs, transfers, bed mobility, and locomotion level  PARTICIPATION LIMITATIONS: meal prep, cleaning, laundry, driving, shopping, community activity, and yard work  PERSONAL FACTORS: Time since onset of injury/illness/exacerbation and 3+ comorbidities: Lumbar fusion, DISH, DM II with polyneuropathy, HTN  are also affecting patient's functional outcome.   REHAB POTENTIAL: Good - may be complicated by complex PMH  CLINICAL DECISION MAKING: Evolving/moderate complexity  EVALUATION COMPLEXITY: Moderate   GOALS: Goals reviewed with patient? No  SHORT TERM GOALS: Target date: 07/09/2023   Pt will be compliant and knowledgeable with initial HEP for improved comfort and carryover Baseline: initial HEP given  Goal status: INITIAL  2.  Pt will self report back pain no greater than 7/10 for improved comfort and functional ability Baseline: 10/10 at worst Goal status: INITIAL   LONG TERM GOALS: Target date: 08/13/2023   Pt will improve FOTO function score to no less than 48% as proxy for functional improvement with home ADLs and community activities  Baseline: 37% function Goal status: INITIAL   2.  Pt will self report back pain no greater than 3/10 for improved comfort and functional ability Baseline: 10/10 at worst Goal status: INITIAL   3.  Pt will decrease Five Time Sit to Stand time to no less than 22 seconds for improved balance, strength, and functional  mobility Baseline: 30 seconds Goal status: INITIAL    4.  Pt will improve bilateral cervical rotation to no less than 60 degrees for improved comfort and functional ability with driving and home ADLs Baseline: see ROM chart Goal status: INITIAL  5.  Pt will improve LE MMT to no less than 5/5 for all tested motions for improved functional mobility and decreased pain in lower back Baseline: see MMT chart Goal status: INITIAL   6.  Pt will improve walking tolerance to no less than 20 minutes for improved community navigation and improving functional ability with desired recreational activity Baseline: 10 minutes Goal status: INITIAL  PLAN:  PT FREQUENCY: 2x/week  PT DURATION: 8 weeks  PLANNED INTERVENTIONS: 97164- PT Re-evaluation, 97110-Therapeutic exercises, 97530- Therapeutic activity, [id number]- Neuromuscular re-education, 97535- Self Care, [id number]- Manual therapy, [id number]- Gait training, [id number]- Aquatic Therapy, 97014-  Electrical stimulation (unattended), [id number]- Electrical stimulation (manual), 66440- Vasopneumatic device, Dry Needling, Cryotherapy, and Moist heat  PLAN FOR NEXT SESSION: assess HEP response, core/hip and periscapular/DNF strengthening, TPDN to upper traps and suboccipitals   For all possible CPT codes, reference the Planned Interventions line above.     Check all conditions that are expected to impact treatment: {Conditions expected to impact treatment:Diabetes mellitus and Musculoskeletal disorders   If treatment provided at initial evaluation, no treatment charged due to lack of authorization.       Eloy End, PT 06/19/2023, 8:31 AM

## 2023-06-28 DIAGNOSIS — M4317 Spondylolisthesis, lumbosacral region: Secondary | ICD-10-CM | POA: Diagnosis not present

## 2023-06-28 DIAGNOSIS — Z6829 Body mass index (BMI) 29.0-29.9, adult: Secondary | ICD-10-CM | POA: Diagnosis not present

## 2023-07-01 ENCOUNTER — Ambulatory Visit: Payer: Medicaid Other | Attending: Physical Medicine & Rehabilitation

## 2023-07-01 DIAGNOSIS — M5459 Other low back pain: Secondary | ICD-10-CM | POA: Diagnosis not present

## 2023-07-01 DIAGNOSIS — M542 Cervicalgia: Secondary | ICD-10-CM | POA: Insufficient documentation

## 2023-07-01 DIAGNOSIS — M6281 Muscle weakness (generalized): Secondary | ICD-10-CM | POA: Diagnosis not present

## 2023-07-01 NOTE — Therapy (Signed)
OUTPATIENT PHYSICAL THERAPY TREATMENT   Patient Name: Gerald Olson MRN: [id number] DOB:01-Mar-1966, 57 y.o., male Today's Date: 07/01/2023  END OF SESSION:  PT End of Session - 07/01/23 1538     Visit Number 2    Number of Visits 17    Date for PT Re-Evaluation 08/13/23    Authorization Type [place] [employer]    PT Start Time 1538   arrived late   PT Stop Time 1616    PT Time Calculation (min) 38 min    Activity Tolerance Patient tolerated treatment well    Behavior During Therapy WFL for tasks assessed/performed              Past Medical History:  Diagnosis Date   ADHD (attention deficit hyperactivity disorder)    Diagnosed during teenage years   Alcohol use disorder    On average 14-21 drinks per week   Alcoholic cirrhosis of liver    Per patient - condition is not related to moderate alcohol use but rather prolonged NSAID use and recent abuse.   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[place]   Major depressive disorder    Other spondylosis with radiculopathy, cervical region    Retinopathy    Right eye; per patient report   Type 2 diabetes mellitus with hyperglycemia    Past Surgical History:  Procedure Laterality Date   CHOLECYSTECTOMY N/A 06/02/2018   Procedure: LAPAROSCOPIC CHOLECYSTECTOMY;  Surgeon: Ancil Linsey, MD;  Location: [facility] ORS;  Service: General;  Laterality: N/A;   KNEE ARTHROSCOPY     30 Years ago   lumbar five-sacral one posterior lumbar interbody fusion  01/24/2021   Patient Active Problem List   Diagnosis Date Noted   Onychomycosis 09/21/2022   Other fatigue 09/20/2021    Spondylolisthesis at L5-S1 level 01/24/2021   Cervical radicular pain 10/26/2020   Lumbar facet arthropathy 10/26/2020   Lumbar degenerative disc disease 10/26/2020   Opiate misuse 10/26/2020   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 08/09/2020   Mesenteric lymphadenopathy 06/14/2020   Splenomegaly- mild  06/14/2020   History of cannabis abuse 06/14/2020   Recurrent major depressive disorder, in partial remission (HCC) 06/09/2020   Anxiety 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 06/09/2020   Alcohol use disorder, moderate, dependence (HCC) 06/09/2020   Frequent falls 05/23/2020   Foraminal stenosis of cervical region 05/09/2020   Diabetic polyneuropathy    ADHD (attention deficit hyperactivity disorder)    Generalized anxiety disorder    Major depressive disorder    Cervical spondylosis 09/22/2019   Essential hypertension, benign 01/28/2019   Alcoholic cirrhosis of liver 06/02/2018   Type 2 diabetes mellitus with hyperglycemia 04/25/2018    PCP: No PCP  REFERRING PROVIDER: Fanny Dance, MD   REFERRING DIAG: [phone] (ICD-10-CM) - Lumbar spondylosis   Rationale for Evaluation and Treatment: Rehabilitation  THERAPY DIAG:  Cervicalgia  Other low back pain  Muscle weakness (generalized)  ONSET DATE: Chronic  SUBJECTIVE:  SUBJECTIVE STATEMENT: Pt presents to PT with reports of continued pain and discomfort. Has been compliant with HEP and notes pain in R shoulder.   PERTINENT HISTORY:  Lumbar fusion, DISH, DM II with polyneuropathy, HTN  PAIN:  Are you having pain?  Yes: NPRS scale: 7/10 Pain location: cervical spine Pain description: sharp, tight Aggravating factors: rotation, driving, lifting Relieving factors: medication  Are you having pain?  Yes:  NPRS scale: 3/10 Pain location: thoracic spine Pain description: sharp Aggravating factors: bending, lifting Relieving factors: medication, inversion table  Are you having pain?  Yes: NPRS scale: 7/10 Pain location: lumbar spine Pain description: sharp, dull ache Aggravating factors: medication Relieving factors: medication, inversion  PRECAUTIONS: None  RED FLAGS: None   WEIGHT BEARING RESTRICTIONS: No  FALLS:  Has patient fallen in last 6 months? No  LIVING ENVIRONMENT: Lives with: lives alone Lives in: House/apartment Stairs: Yes Has following equipment at home: None  OCCUPATION: Not working  PLOF: Independent  PATIENT GOALS: decrease pain, improve mobility and get back to doing things more comfortably  NEXT MD VISIT: November - unsure of exact date  OBJECTIVE:  Note: Objective measures were completed at Evaluation unless otherwise noted.  DIAGNOSTIC FINDINGS:  See imaging   PATIENT SURVEYS:  FOTO: 37% function; 48% predicted  COGNITION: Overall cognitive status: Within functional limits for tasks assessed     SENSATION: Light touch: Impaired - distal extremities due to neuropaty  MUSCLE LENGTH: Hamstrings: DNT Thomas test: DNT  POSTURE: rounded shoulders, forward head, and flexed trunk   PALPATION: TTP to upper trap, lumbar paraspinals   CERVICAL ROM:   AROM eval  Flexion   Extension   Right lateral flexion   Left lateral flexion   Right rotation 32  Left rotation 46   (Blank rows = not tested)  LOWER EXTREMITY MMT:    MMT Right eval Left eval  Hip flexion 4/5 4/5  Hip extension    Hip abduction 4/5 4/5  Hip adduction    Hip internal rotation    Hip external rotation    Knee flexion 4/5 4/5  Knee extension 4/5 4/5  Ankle dorsiflexion    Ankle plantarflexion    Ankle inversion    Ankle eversion     (Blank rows = not tested)  LUMBAR SPECIAL TESTS:  Slump test: Positive  FUNCTIONAL TESTS:  Five Time Sit to Stand: 30  seconds  GAIT: Distance walked: 43ft Assistive device utilized: None Level of assistance: Complete Independence Comments: antaglic gait  TREATMENT: [facility] Adult PT Treatment:                                                DATE: 07/01/2023 Therapeutic Exercise: NuStep lvl 5 UE/LE x 3 min while taking subjective Rows 2x15 blue band LTR x 10  Bridge with GTB 2x10 S/L clamshell 2x15 GTB Supine piriformis stretch x 45" L Supine SLR 2x15 each Supine horizontal abd 2x15 GTB Supine chin tuck 2x10  [facility] Adult PT Treatment:                                                DATE: 06/18/2023 Therapeutic Exercise: Upper trap and levator stretch x 30" each Seated chin tuck x 10  Seated bilateral ER x 10 - GTB  PATIENT EDUCATION:  Education details: HEP update Person educated: Patient Education method: Explanation, Demonstration, and Handouts Education comprehension: verbalized understanding and returned demonstration  HOME EXERCISE PROGRAM: Access Code: [email or web address] URL: [email or web address] Date: 07/01/2023 Prepared by: Edwinna Areola  Exercises - Seated Upper Trapezius Stretch  - 1 x daily - 7 x weekly - 2 reps - 30 sec hold - Seated Levator Scapulae Stretch  - 1 x daily - 7 x weekly - 2 reps - 30 sec hold - Seated Cervical Retraction  - 1 x daily - 7 x weekly - 2 sets - 10 reps - 5 sec hold - Standing Shoulder Row with Anchored Resistance  - 1 x daily - 7 x weekly - 3 sets - 15 reps - blue band hold - Supine Bridge with Resistance Band  - 1 x daily - 7 x weekly - 3 sets - 10 reps - green band hold  ASSESSMENT:  CLINICAL IMPRESSION: Pt was able to complete all prescribed exercises with no adverse effect. Therapy today focused on improving core/hip strength as well as periscapular strength and thoracic mobility. HEP updated to decrease pain with periscapular exercises at home. Will continue to progress as able per POC.   EVAL:Patient is a 57 y.o. M who was seen today for  physical therapy evaluation and treatment for chronic LBP along with chronic thoracic and cervical discomfort. Physical findings are consistent with MD impression as pt demonstrates decrease in core/hip strength and general functional mobility as well as thoracic stiffness and decreased cervical ROM. FOTO score demonstrates decrease in subjective functional ability below PLOF. Pt would benefit from skilled PT services working on improving strength and decreasing pain.    OBJECTIVE IMPAIRMENTS: Abnormal gait, decreased activity tolerance, decreased balance, decreased endurance, decreased mobility, difficulty walking, decreased ROM, decreased strength, impaired UE functional use, postural dysfunction, and pain.   ACTIVITY LIMITATIONS: carrying, lifting, bending, sitting, standing, squatting, sleeping, stairs, transfers, bed mobility, and locomotion level  PARTICIPATION LIMITATIONS: meal prep, cleaning, laundry, driving, shopping, community activity, and yard work  PERSONAL FACTORS: Time since onset of injury/illness/exacerbation and 3+ comorbidities: Lumbar fusion, DISH, DM II with polyneuropathy, HTN  are also affecting patient's functional outcome.   REHAB POTENTIAL: Good - may be complicated by complex PMH  CLINICAL DECISION MAKING: Evolving/moderate complexity  EVALUATION COMPLEXITY: Moderate   GOALS: Goals reviewed with patient? No  SHORT TERM GOALS: Target date: 07/09/2023   Pt will be compliant and knowledgeable with initial HEP for improved comfort and carryover Baseline: initial HEP given  Goal status: INITIAL  2.  Pt will self report back pain no greater than 7/10 for improved comfort and functional ability Baseline: 10/10 at worst Goal status: INITIAL   LONG TERM GOALS: Target date: 08/13/2023   Pt will improve FOTO function score to no less than 48% as proxy for functional improvement with home ADLs and community activities  Baseline: 37% function Goal status: INITIAL    2.  Pt will self report back pain no greater than 3/10 for improved comfort and functional ability Baseline: 10/10 at worst Goal status: INITIAL   3.  Pt will decrease Five Time Sit to Stand time to no less than 22 seconds for improved balance, strength, and functional mobility Baseline: 30 seconds Goal status: INITIAL    4.  Pt will improve bilateral cervical rotation to no less than 60 degrees for improved comfort and functional ability with driving and home ADLs Baseline: see ROM chart  Goal status: INITIAL  5.  Pt will improve LE MMT to no less than 5/5 for all tested motions for improved functional mobility and decreased pain in lower back Baseline: see MMT chart Goal status: INITIAL   6.  Pt will improve walking tolerance to no less than 20 minutes for improved community navigation and improving functional ability with desired recreational activity Baseline: 10 minutes Goal status: INITIAL  PLAN:  PT FREQUENCY: 2x/week  PT DURATION: 8 weeks  PLANNED INTERVENTIONS: 97164- PT Re-evaluation, 97110-Therapeutic exercises, 97530- Therapeutic activity, 97112- Neuromuscular re-education, 97535- Self Care, 14782- Manual therapy, [id number]- Gait training, [id number]- Aquatic Therapy, 97014- Electrical stimulation (unattended), [id number]- Electrical stimulation (manual), 97016- Vasopneumatic device, Dry Needling, Cryotherapy, and Moist heat  PLAN FOR NEXT SESSION: assess HEP response, core/hip and periscapular/DNF strengthening, TPDN to upper traps and suboccipitals   For all possible CPT codes, reference the Planned Interventions line above.     Check all conditions that are expected to impact treatment: {Conditions expected to impact treatment:Diabetes mellitus and Musculoskeletal disorders   If treatment provided at initial evaluation, no treatment charged due to lack of authorization.       Eloy End, PT 07/01/2023, 4:38 PM

## 2023-07-02 ENCOUNTER — Other Ambulatory Visit: Payer: Self-pay

## 2023-07-02 NOTE — Patient Outreach (Signed)
Medicaid Managed Care Social Work Note  07/02/2023 Name:  Gerald Olson MRN:  [id number] DOB:  24-Apr-1966  Gerald Olson is an 57 y.o. year old male who is a primary patient of Patient, [name].  The [facility] Managed Care Coordination team was consulted for assistance with:  Community Resources   Gerald Olson was given information about Medicaid Managed Care Coordination team services today. Gerald Olson Patient agreed to services and verbal consent obtained.  Engaged with patient  for by telephone forfollow up visit in response to referral for case management and/or care coordination services.   Assessments/Interventions:  Review of past medical history, allergies, medications, health status, including review of consultants reports, laboratory and other test data, was performed as part of comprehensive evaluation and provision of chronic care management services.  SDOH: (Social Determinant of Health) assessments and interventions performed: SDOH Interventions    Flowsheet Row Patient Outreach Telephone from 05/24/2023 in [place] POPULATION HEALTH DEPARTMENT Patient Outreach Telephone from 03/25/2023 in [place] POPULATION HEALTH DEPARTMENT Patient Outreach Telephone from 03/18/2023 in [place] POPULATION HEALTH DEPARTMENT Patient Outreach Telephone from 02/12/2023 in [place] POPULATION HEALTH DEPARTMENT Patient Outreach Telephone from 01/29/2023 in [place] POPULATION HEALTH DEPARTMENT Patient Outreach Telephone from 01/23/2023 in [place] POPULATION HEALTH DEPARTMENT  SDOH Interventions        Housing Interventions -- -- -- -- --  Research officer, trade union for [employer] BSW involvement] --  Transportation Interventions -- -- -- Intervention Not Indicated -- --  Alcohol Usage Interventions -- -- -- -- -- Alcohol Education/Brief Advice  [education provided by providers]  Financial Strain Interventions Other (Comment)  [patiewnt has been provided with resources] -- -- -- -- --  Physical Activity  Interventions -- Intervention Not Indicated  [spondylosis-unable to engage in this type of exercise] -- -- -- --  Stress Interventions -- -- [employer], Provide Counseling Offered [employer], Provide Counseling Offered [employer], Provide Counseling --  Social Connections Interventions Intervention Not Indicated -- -- -- -- --  Health Literacy Interventions -- Intervention Not Indicated -- -- -- --      BSW completed a telephone outreach outreach with patient, he states he was able to pay all of his bills for the month. Patient states he would like some resources for utilities just to have since DSS does not have any funds. BSW will mail resources to patient. No other resources are needed at this time. Advanced Directives Status:  Not addressed in this encounter.  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 08/09/2020   Mesenteric lymphadenopathy 06/14/2020   Splenomegaly- mild  06/14/2020   History of cannabis abuse 06/14/2020   Recurrent major depressive disorder, in partial remission (HCC) 06/09/2020   Anxiety 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 06/09/2020   Alcohol use disorder, moderate, dependence (HCC) 06/09/2020   Frequent falls 05/23/2020   Foraminal stenosis of cervical region 05/09/2020   Diabetic polyneuropathy  ADHD (attention deficit hyperactivity disorder)    Generalized anxiety disorder    Major depressive disorder    Cervical spondylosis 09/22/2019    Essential hypertension, benign 01/28/2019   Alcoholic cirrhosis of liver 06/02/2018   Type 2 diabetes mellitus with hyperglycemia 04/25/2018    Conditions to be addressed/monitored per PCP order:   community resources  There are no care plans that you recently modified to display for this patient.   Follow up:  Patient agrees to Care Plan and Follow-up.  Plan: The Managed Medicaid care management team will reach out to the patient again over the next 30-60 days.  Date/time of next scheduled Social Work care management/care coordination outreach:  08/13/23  Gerald Olson, Gerald Olson, [facility] [employer] [facility] Health  Managed [facility] Social Worker [phone]

## 2023-07-02 NOTE — Patient Instructions (Signed)
Visit Information  Mr. Skillman was given information about Medicaid Managed Care team care coordination services as a part of their Healthy [facility] Medicaid benefit. [name] verbally consented to engagement with the [facility] Managed Care team.   If you are experiencing a medical emergency, please call 911 or report to your local emergency department or urgent care.   If you have a non-emergency medical problem during routine business hours, please contact your provider's office and ask to speak with a nurse.   For questions related to your Healthy [facility] health plan, please call: [phone] or visit the homepage here: [email or web address]  If you would like to schedule transportation through your Healthy [facility] plan, please call the following number at least 2 days in advance of your appointment: [phone]  For information about your ride after you set it up, call Ride Assist at [phone]. Use this number to activate a Will Call pickup, or if your transportation is late for a scheduled pickup. Use this number, too, if you need to make a change or cancel a previously scheduled reservation.  If you need transportation services right away, call [phone]. The after-hours call center is staffed 24 hours to handle ride assistance and urgent reservation requests (including discharges) 365 days a year. Urgent trips include sick visits, hospital discharge requests and life-sustaining treatment.  Call the [facility] Line at [phone], at any time, 24 hours a day, 7 days a week. If you are in danger or need immediate medical attention call 911.  If you would like help to quit smoking, call 1-800-QUIT-NOW (640-532-4586) OR Espaol: 1-855-Djelo-Ya (4-742-595-6387) o para ms informacin haga clic aqu or Text READY to 564-332 to register via text  Gerald Olson - following are the goals we discussed in your visit today:   Goals  Addressed   None      Social Worker will follow up in 30-60 days.  Gus Puma, Kenard Gower, MHA [facility] Health  Managed Medicaid Social Worker [phone]   Following is a copy of your plan of care:  There are no care plans that you recently modified to display for this patient.

## 2023-07-08 ENCOUNTER — Encounter: Payer: Self-pay | Admitting: Physical Therapy

## 2023-07-08 ENCOUNTER — Ambulatory Visit: Payer: Medicaid Other | Admitting: Physical Therapy

## 2023-07-08 DIAGNOSIS — M6281 Muscle weakness (generalized): Secondary | ICD-10-CM

## 2023-07-08 DIAGNOSIS — M5459 Other low back pain: Secondary | ICD-10-CM | POA: Diagnosis not present

## 2023-07-08 DIAGNOSIS — M542 Cervicalgia: Secondary | ICD-10-CM | POA: Diagnosis not present

## 2023-07-08 NOTE — Therapy (Signed)
OUTPATIENT PHYSICAL THERAPY TREATMENT   Patient Name: Gerald Olson MRN: [id number] DOB:07/06/1966, 57 y.o., male Today's Date: 07/08/2023  END OF SESSION:  PT End of Session - 07/08/23 1449     Visit Number 3    Number of Visits 17    Date for PT Re-Evaluation 08/13/23    Authorization Type [place] [employer]    Authorization Time Period 06/24/23-08/22/23    Authorization - Visit Number 2    Authorization - Number of Visits 8    PT Start Time 0250    PT Stop Time 0328    PT Time Calculation (min) 38 min              Past Medical History:  Diagnosis Date   ADHD (attention deficit hyperactivity disorder)    Diagnosed during teenage years   Alcohol use disorder    On average 14-21 drinks per week   Alcoholic cirrhosis of liver    Per patient - condition is not related to moderate alcohol use but rather prolonged NSAID use and recent abuse.   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[place]   Major depressive disorder    Other spondylosis with radiculopathy, cervical region    Retinopathy    Right eye; per patient report   Type 2 diabetes mellitus with hyperglycemia    Past Surgical History:  Procedure Laterality Date   CHOLECYSTECTOMY N/A 06/02/2018   Procedure: LAPAROSCOPIC CHOLECYSTECTOMY;  Surgeon: Ancil Linsey, MD;  Location: [facility] ORS;  Service: General;  Laterality: N/A;   KNEE ARTHROSCOPY     30 Years ago   lumbar five-sacral one posterior lumbar interbody fusion  01/24/2021   Patient Active Problem List   Diagnosis Date Noted   Onychomycosis 09/21/2022   Other fatigue 09/20/2021    Spondylolisthesis at L5-S1 level 01/24/2021   Cervical radicular pain 10/26/2020   Lumbar facet arthropathy 10/26/2020   Lumbar degenerative disc disease 10/26/2020   Opiate misuse 10/26/2020   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 08/09/2020   Mesenteric lymphadenopathy 06/14/2020   Splenomegaly- mild  06/14/2020   History of cannabis abuse 06/14/2020   Recurrent major depressive disorder, in partial remission (HCC) 06/09/2020   Anxiety 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 06/09/2020   Alcohol use disorder, moderate, dependence (HCC) 06/09/2020   Frequent falls 05/23/2020   Foraminal stenosis of cervical region 05/09/2020   Diabetic polyneuropathy    ADHD (attention deficit hyperactivity disorder)    Generalized anxiety disorder    Major depressive disorder    Cervical spondylosis 09/22/2019   Essential hypertension, benign 01/28/2019   Alcoholic cirrhosis of liver 06/02/2018   Type 2 diabetes mellitus with hyperglycemia 04/25/2018    PCP: No PCP  REFERRING PROVIDER: Fanny Dance, MD   REFERRING DIAG: [phone] (ICD-10-CM) - Lumbar spondylosis   Rationale for Evaluation and Treatment: Rehabilitation  THERAPY DIAG:  No diagnosis found.  ONSET DATE: Chronic  SUBJECTIVE:  SUBJECTIVE STATEMENT: Pt presents to PT with reports of continued pain and discomfort. Has been compliant with HEP and notes pain in R shoulder.   PERTINENT HISTORY:  Lumbar fusion, DISH, DM II with polyneuropathy, HTN  PAIN:  Are you having pain?  Yes: NPRS scale: 8/10 Pain location: cervical spine Pain description: sharp, tight Aggravating factors: rotation, driving, lifting Relieving factors: medication  Are you having pain?  Yes: NPRS scale: 8/10 Pain location: thoracic  spine Pain description: sharp Aggravating factors: bending, lifting Relieving factors: medication, inversion table  Are you having pain?  Yes: NPRS scale: 8/10 Pain location: lumbar spine Pain description: sharp, dull ache Aggravating factors: medication Relieving factors: medication, inversion  PRECAUTIONS: None  RED FLAGS: None   WEIGHT BEARING RESTRICTIONS: No  FALLS:  Has patient fallen in last 6 months? No  LIVING ENVIRONMENT: Lives with: lives alone Lives in: House/apartment Stairs: Yes Has following equipment at home: None  OCCUPATION: Not working  PLOF: Independent  PATIENT GOALS: decrease pain, improve mobility and get back to doing things more comfortably  NEXT MD VISIT: November - unsure of exact date  OBJECTIVE:  Note: Objective measures were completed at Evaluation unless otherwise noted.  DIAGNOSTIC FINDINGS:  See imaging   PATIENT SURVEYS:  FOTO: 37% function; 48% predicted  COGNITION: Overall cognitive status: Within functional limits for tasks assessed     SENSATION: Light touch: Impaired - distal extremities due to neuropaty  MUSCLE LENGTH: Hamstrings: DNT Thomas test: DNT  POSTURE: rounded shoulders, forward head, and flexed trunk   PALPATION: TTP to upper trap, lumbar paraspinals   CERVICAL ROM:   AROM eval  Flexion   Extension   Right lateral flexion   Left lateral flexion   Right rotation 32  Left rotation 46   (Blank rows = not tested)  LOWER EXTREMITY MMT:    MMT Right eval Left eval  Hip flexion 4/5 4/5  Hip extension    Hip abduction 4/5 4/5  Hip adduction    Hip internal rotation    Hip external rotation    Knee flexion 4/5 4/5  Knee extension 4/5 4/5  Ankle dorsiflexion    Ankle plantarflexion    Ankle inversion    Ankle eversion     (Blank rows = not tested)  LUMBAR SPECIAL TESTS:  Slump test: Positive  FUNCTIONAL TESTS:  Five Time Sit to Stand: 30 seconds  GAIT: Distance walked:  57ft Assistive device utilized: None Level of assistance: Complete Independence Comments: antaglic gait  TREATMENT: [facility] Adult PT Treatment:                                                DATE: 07/08/23 Therapeutic Exercise: Nustep L5 LE only x 5 minutes  Green band scap retractions UT and levator stretch  Seated lumbar flexion stretch with ball PPT 5 sec x 15 Supine clam GTB  Banded bridge GTB Supine horiz abdct GTB  Supine DNF  Supine piriformis stretch 30 sec each  LTR  SKTC    [facility] Adult PT Treatment:                                                DATE: 07/01/2023 Therapeutic Exercise: NuStep lvl 5  UE/LE x 3 min while taking subjective Rows 2x15 blue band LTR x 10  Bridge with GTB 2x10 S/L clamshell 2x15 GTB Supine piriformis stretch x 45" L Supine SLR 2x15 each Supine horizontal abd 2x15 GTB Supine chin tuck 2x10  [facility] Adult PT Treatment:                                                DATE: 06/18/2023 Therapeutic Exercise: Upper trap and levator stretch x 30" each Seated chin tuck x 10 Seated bilateral ER x 10 - GTB  PATIENT EDUCATION:  Education details: HEP update Person educated: Patient Education method: Explanation, Demonstration, and Handouts Education comprehension: verbalized understanding and returned demonstration  HOME EXERCISE PROGRAM: Access Code: [email or web address] URL: [email or web address] Date: 07/01/2023 Prepared by: Edwinna Areola  Exercises - Seated Upper Trapezius Stretch  - 1 x daily - 7 x weekly - 2 reps - 30 sec hold - Seated Levator Scapulae Stretch  - 1 x daily - 7 x weekly - 2 reps - 30 sec hold - Seated Cervical Retraction  - 1 x daily - 7 x weekly - 2 sets - 10 reps - 5 sec hold - Standing Shoulder Row with Anchored Resistance  - 1 x daily - 7 x weekly - 3 sets - 15 reps - blue band hold - Supine Bridge with Resistance Band  - 1 x daily - 7 x weekly - 3 sets - 10 reps - green band hold  ASSESSMENT:  CLINICAL  IMPRESSION: Pt was able to complete all prescribed exercises with no adverse effect. Pt reports he increased his entire spine pain while working on a project yesterday. Therapy today focused on improving core/hip strength as well as periscapular strength and thoracic mobility.  Will continue to progress as able per POC.   EVAL:Patient is a 57 y.o. M who was seen today for physical therapy evaluation and treatment for chronic LBP along with chronic thoracic and cervical discomfort. Physical findings are consistent with MD impression as pt demonstrates decrease in core/hip strength and general functional mobility as well as thoracic stiffness and decreased cervical ROM. FOTO score demonstrates decrease in subjective functional ability below PLOF. Pt would benefit from skilled PT services working on improving strength and decreasing pain.    OBJECTIVE IMPAIRMENTS: Abnormal gait, decreased activity tolerance, decreased balance, decreased endurance, decreased mobility, difficulty walking, decreased ROM, decreased strength, impaired UE functional use, postural dysfunction, and pain.   ACTIVITY LIMITATIONS: carrying, lifting, bending, sitting, standing, squatting, sleeping, stairs, transfers, bed mobility, and locomotion level  PARTICIPATION LIMITATIONS: meal prep, cleaning, laundry, driving, shopping, community activity, and yard work  PERSONAL FACTORS: Time since onset of injury/illness/exacerbation and 3+ comorbidities: Lumbar fusion, DISH, DM II with polyneuropathy, HTN  are also affecting patient's functional outcome.   REHAB POTENTIAL: Good - may be complicated by complex PMH  CLINICAL DECISION MAKING: Evolving/moderate complexity  EVALUATION COMPLEXITY: Moderate   GOALS: Goals reviewed with patient? No  SHORT TERM GOALS: Target date: 07/09/2023   Pt will be compliant and knowledgeable with initial HEP for improved comfort and carryover Baseline: initial HEP given  Goal status:  INITIAL  2.  Pt will self report back pain no greater than 7/10 for improved comfort and functional ability Baseline: 10/10 at worst Goal status: INITIAL   LONG TERM GOALS: Target date: 08/13/2023   Pt will improve FOTO  function score to no less than 48% as proxy for functional improvement with home ADLs and community activities  Baseline: 37% function Goal status: INITIAL   2.  Pt will self report back pain no greater than 3/10 for improved comfort and functional ability Baseline: 10/10 at worst Goal status: INITIAL   3.  Pt will decrease Five Time Sit to Stand time to no less than 22 seconds for improved balance, strength, and functional mobility Baseline: 30 seconds Goal status: INITIAL    4.  Pt will improve bilateral cervical rotation to no less than 60 degrees for improved comfort and functional ability with driving and home ADLs Baseline: see ROM chart Goal status: INITIAL  5.  Pt will improve LE MMT to no less than 5/5 for all tested motions for improved functional mobility and decreased pain in lower back Baseline: see MMT chart Goal status: INITIAL   6.  Pt will improve walking tolerance to no less than 20 minutes for improved community navigation and improving functional ability with desired recreational activity Baseline: 10 minutes Goal status: INITIAL  PLAN:  PT FREQUENCY: 2x/week  PT DURATION: 8 weeks  PLANNED INTERVENTIONS: 97164- PT Re-evaluation, 97110-Therapeutic exercises, 97530- Therapeutic activity, 97112- Neuromuscular re-education, 97535- Self Care, [id number]- Manual therapy, [id number]- Gait training, [id number]- Aquatic Therapy, 97014- Electrical stimulation (unattended), [id number]- Electrical stimulation (manual), 97016- Vasopneumatic device, Dry Needling, Cryotherapy, and Moist heat  PLAN FOR NEXT SESSION: assess HEP response, core/hip and periscapular/DNF strengthening, TPDN to upper traps and suboccipitals   For all possible CPT codes, reference the Planned  Interventions line above.     Check all conditions that are expected to impact treatment: {Conditions expected to impact treatment:Diabetes mellitus and Musculoskeletal disorders   If treatment provided at initial evaluation, no treatment charged due to lack of authorization.       Jannette Spanner, PTA 07/08/23 3:26 PM Phone: [phone] Fax: [phone]

## 2023-07-16 DIAGNOSIS — M5416 Radiculopathy, lumbar region: Secondary | ICD-10-CM | POA: Diagnosis not present

## 2023-07-16 DIAGNOSIS — M5116 Intervertebral disc disorders with radiculopathy, lumbar region: Secondary | ICD-10-CM | POA: Diagnosis not present

## 2023-07-24 ENCOUNTER — Ambulatory Visit: Payer: Medicaid Other | Admitting: Obstetrics and Gynecology

## 2023-07-29 ENCOUNTER — Ambulatory Visit: Payer: Medicaid Other | Attending: Physical Medicine & Rehabilitation

## 2023-07-29 DIAGNOSIS — M542 Cervicalgia: Secondary | ICD-10-CM | POA: Insufficient documentation

## 2023-07-29 DIAGNOSIS — M5459 Other low back pain: Secondary | ICD-10-CM | POA: Diagnosis not present

## 2023-07-29 NOTE — Therapy (Addendum)
 OUTPATIENT PHYSICAL THERAPY TREATMENT NOTE/DISCHARGE  PHYSICAL THERAPY DISCHARGE SUMMARY  Visits from Start of Care: 4  Current functional level related to goals / functional outcomes: See goals/objective   Remaining deficits: Unable to assess   Education / Equipment: HEP   Patient agrees to discharge. Patient goals were unable to assess. Patient is being discharged due to not returning since the last visit.     Patient Name: Gerald Olson MRN: [id number] DOB:Jan 12, 1966, 57 y.o., male Today's Date: 07/29/2023  END OF SESSION:  PT End of Session - 07/29/23 1453     Visit Number 4    Number of Visits 17    Date for PT Re-Evaluation 08/13/23    Authorization Type [place] [employer]    Authorization Time Period 06/24/23-08/22/23    Authorization - Visit Number 3    Authorization - Number of Visits 8    PT Start Time 1453   arrived late   PT Stop Time 1532    PT Time Calculation (min) 39 min               Past Medical History:  Diagnosis Date   ADHD (attention deficit hyperactivity disorder)    Diagnosed during teenage years   Alcohol use disorder    On average 14-21 drinks per week   Alcoholic cirrhosis of liver    Per patient - condition is not related to moderate alcohol use but rather prolonged NSAID use and recent abuse.   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[place]   Major depressive disorder    Other spondylosis with radiculopathy, cervical region    Retinopathy    Right eye; per patient report   Type 2 diabetes mellitus with hyperglycemia    Past Surgical History:  Procedure Laterality  Date   CHOLECYSTECTOMY N/A 06/02/2018   Procedure: LAPAROSCOPIC CHOLECYSTECTOMY;  Surgeon: Nicholaus Selinda Birmingham, MD;  Location: [facility] ORS;  Service: General;  Laterality: N/A;   KNEE ARTHROSCOPY     30 Years ago   lumbar five-sacral one posterior lumbar interbody fusion  01/24/2021   Patient Active Problem List   Diagnosis Date Noted   Onychomycosis 09/21/2022   Other fatigue 09/20/2021   Spondylolisthesis at L5-S1 level 01/24/2021   Cervical radicular pain 10/26/2020   Lumbar facet arthropathy 10/26/2020   Lumbar degenerative disc disease 10/26/2020   Opiate misuse 10/26/2020   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 08/09/2020   Mesenteric lymphadenopathy 06/14/2020   Splenomegaly- mild  06/14/2020   History of cannabis abuse 06/14/2020   Recurrent major depressive disorder, in partial remission (HCC) 06/09/2020   Anxiety 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 06/09/2020   Alcohol use disorder, moderate, dependence (HCC) 06/09/2020   Frequent falls 05/23/2020   Foraminal stenosis of cervical region 05/09/2020   Diabetic polyneuropathy    ADHD (attention deficit hyperactivity disorder)    Generalized anxiety disorder    Major depressive disorder    Cervical spondylosis 09/22/2019   Essential hypertension, benign 01/28/2019   Alcoholic cirrhosis of liver 06/02/2018  Type 2 diabetes mellitus with hyperglycemia 04/25/2018    PCP: No PCP  REFERRING PROVIDER: Urbano Albright, MD   REFERRING DIAG: [phone] (ICD-10-CM) - Lumbar spondylosis   Rationale for Evaluation and Treatment: Rehabilitation  THERAPY DIAG:  Cervicalgia  Other low back pain  ONSET DATE: Chronic  SUBJECTIVE:                                                                                                                                                                                           SUBJECTIVE STATEMENT: Pt presents to PT with reports of LBP and discomfort. Feels like  his shot wore off.   PERTINENT HISTORY:  Lumbar fusion, DISH, DM II with polyneuropathy, HTN  PAIN:  Are you having pain?  Yes: NPRS scale: 8/10 Pain location: cervical spine Pain description: sharp, tight Aggravating factors: rotation, driving, lifting Relieving factors: medication  Are you having pain?  Yes: NPRS scale: 8/10 Pain location: thoracic spine Pain description: sharp Aggravating factors: bending, lifting Relieving factors: medication, inversion table  Are you having pain?  Yes: NPRS scale: 8/10 Pain location: lumbar spine Pain description: sharp, dull ache Aggravating factors: medication Relieving factors: medication, inversion  PRECAUTIONS: None  RED FLAGS: None   WEIGHT BEARING RESTRICTIONS: No  FALLS:  Has patient fallen in last 6 months? No  LIVING ENVIRONMENT: Lives with: lives alone Lives in: House/apartment Stairs: Yes Has following equipment at home: None  OCCUPATION: Not working  PLOF: Independent  PATIENT GOALS: decrease pain, improve mobility and get back to doing things more comfortably  NEXT MD VISIT: November - unsure of exact date  OBJECTIVE:  Note: Objective measures were completed at Evaluation unless otherwise noted.  DIAGNOSTIC FINDINGS:  See imaging   PATIENT SURVEYS:  FOTO: 37% function; 48% predicted  COGNITION: Overall cognitive status: Within functional limits for tasks assessed     SENSATION: Light touch: Impaired - distal extremities due to neuropaty  MUSCLE LENGTH: Hamstrings: DNT Thomas test: DNT  POSTURE: rounded shoulders, forward head, and flexed trunk   PALPATION: TTP to upper trap, lumbar paraspinals   CERVICAL ROM:   AROM eval  Flexion   Extension   Right lateral flexion   Left lateral flexion   Right rotation 32  Left rotation 46   (Blank rows = not tested)  LOWER EXTREMITY MMT:    MMT Right eval Left eval  Hip flexion 4/5 4/5  Hip extension    Hip abduction 4/5 4/5  Hip  adduction    Hip internal rotation    Hip external rotation    Knee flexion 4/5 4/5  Knee extension 4/5 4/5  Ankle  dorsiflexion    Ankle plantarflexion    Ankle inversion    Ankle eversion     (Blank rows = not tested)  LUMBAR SPECIAL TESTS:  Slump test: Positive  FUNCTIONAL TESTS:  Five Time Sit to Stand: 30 seconds  GAIT: Distance walked: 55ft Assistive device utilized: None Level of assistance: Complete Independence Comments: antaglic gait  TREATMENT: [facility] Adult PT Treatment:                                                DATE: 07/29/23 Therapeutic Exercise: LTR x 5 each PPT x 5 - 5 hold PPT with ball 2x5 Supine clamshell 2x15 black band Supine pilates SLR 2x10 each Supine horizontal abd x 15 GTB Supine bilateral ER x 15 GTB Modalities: MHP to lumbar spine during supine exercises  [facility] Adult PT Treatment:                                                DATE: 07/08/23 Therapeutic Exercise: Nustep L5 LE only x 5 minutes  Green band scap retractions UT and levator stretch  Seated lumbar flexion stretch with ball PPT 5 sec x 15 Supine clam GTB  Banded bridge GTB Supine horiz abdct GTB  Supine DNF  Supine piriformis stretch 30 sec each  LTR  SKTC    [facility] Adult PT Treatment:                                                DATE: 07/01/2023 Therapeutic Exercise: NuStep lvl 5 UE/LE x 3 min while taking subjective Rows 2x15 blue band LTR x 10  Bridge with GTB 2x10 S/L clamshell 2x15 GTB Supine piriformis stretch x 45 L Supine SLR 2x15 each Supine horizontal abd 2x15 GTB Supine chin tuck 2x10  [facility] Adult PT Treatment:                                                DATE: 06/18/2023 Therapeutic Exercise: Upper trap and levator stretch x 30 each Seated chin tuck x 10 Seated bilateral ER x 10 - GTB  PATIENT EDUCATION:  Education details: HEP update Person educated: Patient Education method: Explanation, Demonstration, and Handouts Education comprehension:  verbalized understanding and returned demonstration  HOME EXERCISE PROGRAM: Access Code: [email or web address] URL: [email or web address] Date: 07/01/2023 Prepared by: Alm Kingdom  Exercises - Seated Upper Trapezius Stretch  - 1 x daily - 7 x weekly - 2 reps - 30 sec hold - Seated Levator Scapulae Stretch  - 1 x daily - 7 x weekly - 2 reps - 30 sec hold - Seated Cervical Retraction  - 1 x daily - 7 x weekly - 2 sets - 10 reps - 5 sec hold - Standing Shoulder Row with Anchored Resistance  - 1 x daily - 7 x weekly - 3 sets - 15 reps - blue band hold - Supine Bridge with Resistance Band  - 1 x daily - 7 x weekly - 3  sets - 10 reps - green band hold  ASSESSMENT:  CLINICAL IMPRESSION: Pt was able to complete all prescribed exercises with no adverse effect. Therapy today focused on improving core/hip strength as well as periscapular strength to reduce pain and improving mobility. Will continue to progress as able per POC.   EVAL:Patient is a 57 y.o. M who was seen today for physical therapy evaluation and treatment for chronic LBP along with chronic thoracic and cervical discomfort. Physical findings are consistent with MD impression as pt demonstrates decrease in core/hip strength and general functional mobility as well as thoracic stiffness and decreased cervical ROM. FOTO score demonstrates decrease in subjective functional ability below PLOF. Pt would benefit from skilled PT services working on improving strength and decreasing pain.    OBJECTIVE IMPAIRMENTS: Abnormal gait, decreased activity tolerance, decreased balance, decreased endurance, decreased mobility, difficulty walking, decreased ROM, decreased strength, impaired UE functional use, postural dysfunction, and pain.   ACTIVITY LIMITATIONS: carrying, lifting, bending, sitting, standing, squatting, sleeping, stairs, transfers, bed mobility, and locomotion level  PARTICIPATION LIMITATIONS: meal prep, cleaning, laundry, driving,  shopping, community activity, and yard work  PERSONAL FACTORS: Time since onset of injury/illness/exacerbation and 3+ comorbidities: Lumbar fusion, DISH, DM II with polyneuropathy, HTN are also affecting patient's functional outcome.   REHAB POTENTIAL: Good - may be complicated by complex PMH  CLINICAL DECISION MAKING: Evolving/moderate complexity  EVALUATION COMPLEXITY: Moderate   GOALS: Goals reviewed with patient? No  SHORT TERM GOALS: Target date: 07/09/2023   Pt will be compliant and knowledgeable with initial HEP for improved comfort and carryover Baseline: initial HEP given  Goal status: MET  2.  Pt will self report back pain no greater than 7/10 for improved comfort and functional ability Baseline: 10/10 at worst Goal status: IN PROGRESS   LONG TERM GOALS: Target date: 08/13/2023   Pt will improve FOTO function score to no less than 48% as proxy for functional improvement with home ADLs and community activities  Baseline: 37% function Goal status: INITIAL   2.  Pt will self report back pain no greater than 3/10 for improved comfort and functional ability Baseline: 10/10 at worst Goal status: INITIAL   3.  Pt will decrease Five Time Sit to Stand time to no less than 22 seconds for improved balance, strength, and functional mobility Baseline: 30 seconds Goal status: INITIAL    4.  Pt will improve bilateral cervical rotation to no less than 60 degrees for improved comfort and functional ability with driving and home ADLs Baseline: see ROM chart Goal status: INITIAL  5.  Pt will improve LE MMT to no less than 5/5 for all tested motions for improved functional mobility and decreased pain in lower back Baseline: see MMT chart Goal status: INITIAL   6.  Pt will improve walking tolerance to no less than 20 minutes for improved community navigation and improving functional ability with desired recreational activity Baseline: 10 minutes Goal status:  INITIAL  PLAN:  PT FREQUENCY: 2x/week  PT DURATION: 8 weeks  PLANNED INTERVENTIONS: 97164- PT Re-evaluation, 97110-Therapeutic exercises, 97530- Therapeutic activity, 97112- Neuromuscular re-education, 97535- Self Care, [id number]- Manual therapy, [id number]- Gait training, [id number]- Aquatic Therapy, 97014- Electrical stimulation (unattended), [id number]- Electrical stimulation (manual), 97016- Vasopneumatic device, Dry Needling, Cryotherapy, and Moist heat  PLAN FOR NEXT SESSION: assess HEP response, core/hip and periscapular/DNF strengthening, TPDN to upper traps and suboccipitals   For all possible CPT codes, reference the Planned Interventions line above.     Check all conditions that  are expected to impact treatment: {Conditions expected to impact treatment:Diabetes mellitus and Musculoskeletal disorders   If treatment provided at initial evaluation, no treatment charged due to lack of authorization.       Alm JAYSON Kingdom PT  07/29/23 3:41 PM

## 2023-08-05 ENCOUNTER — Ambulatory Visit: Payer: Medicaid Other

## 2023-08-06 ENCOUNTER — Ambulatory Visit: Payer: Self-pay | Admitting: Obstetrics and Gynecology

## 2023-08-12 ENCOUNTER — Ambulatory Visit: Payer: Medicaid Other

## 2023-08-13 ENCOUNTER — Other Ambulatory Visit: Payer: Self-pay

## 2023-08-13 NOTE — Patient Instructions (Signed)
Visit Information  Gerald Olson was given information about Medicaid Managed Care team care coordination services as a part of their Healthy [facility] Medicaid benefit. Gerald Olson verbally consented to engagement with the [facility] Managed Care team.   If you are experiencing a medical emergency, please call 911 or report to your local emergency department or urgent care.   If you have a non-emergency medical problem during routine business hours, please contact your provider's office and ask to speak with a nurse.   For questions related to your Healthy [facility] health plan, please call: [phone] or visit the homepage here: [email or web address]  If you would like to schedule transportation through your Healthy [facility] plan, please call the following number at least 2 days in advance of your appointment: [phone]  For information about your ride after you set it up, call Ride Assist at [phone]. Use this number to activate a Will Call pickup, or if your transportation is late for a scheduled pickup. Use this number, too, if you need to make a change or cancel a previously scheduled reservation.  If you need transportation services right away, call [phone]. The after-hours call center is staffed 24 hours to handle ride assistance and urgent reservation requests (including discharges) 365 days a year. Urgent trips include sick visits, hospital discharge requests and life-sustaining treatment.  Call the [facility] Line at [phone], at any time, 24 hours a day, 7 days a week. If you are in danger or need immediate medical attention call 911.  If you would like help to quit smoking, call 1-800-QUIT-NOW ((564) 235-2718) OR Espaol: 1-855-Djelo-Ya (2-409-735-3299) o para ms informacin haga clic aqu or Text READY to 242-683 to register via text  [name] - following are the goals we discussed in your visit today:   Goals  Addressed   None      The  Patient                                              has been provided with contact information for the Managed Medicaid care management team and has been advised to call with any health related questions or concerns.   Gerald Olson, Gerald Olson, MHA [facility]  Managed Medicaid Social Worker [phone]   Following is a copy of your plan of care:  There are no care plans that you recently modified to display for this patient.

## 2023-08-13 NOTE — Patient Outreach (Signed)
Medicaid Managed Care Social Work Note  08/13/2023 Name:  Gerald Olson MRN:  [id number] DOB:  December 21, 1965  Gerald Olson is an 57 y.o. year old male who is a primary patient of Patient, [name].  The [facility] Managed Care Coordination team was consulted for assistance with:  Community Resources   Gerald Olson was given information about Medicaid Managed Care Coordination team services today. Gerald Olson Patient agreed to services and verbal consent obtained.  Engaged with patient  for by telephone forfollow up visit in response to referral for case management and/or care coordination services.   Patient is participating in a Managed Medicaid Plan:  Yes  Assessments/Interventions:  Review of past medical history, allergies, medications, health status, including review of consultants reports, laboratory and other test data, was performed as part of comprehensive evaluation and provision of chronic care management services.  SDOH: (Social Drivers of Health) assessments and interventions performed: SDOH Interventions    Flowsheet Row Patient Outreach Telephone from 05/24/2023 in [place] POPULATION HEALTH DEPARTMENT Patient Outreach Telephone from 03/25/2023 in [place] POPULATION HEALTH DEPARTMENT Patient Outreach Telephone from 03/18/2023 in [place] POPULATION HEALTH DEPARTMENT Patient Outreach Telephone from 02/12/2023 in [place] POPULATION HEALTH DEPARTMENT Patient Outreach Telephone from 01/29/2023 in [place] POPULATION HEALTH DEPARTMENT Patient Outreach Telephone from 01/23/2023 in [place] POPULATION HEALTH DEPARTMENT  SDOH Interventions        Housing Interventions -- -- -- -- --  Research officer, trade union for [facility] BSW involvement] --  Transportation Interventions -- -- -- Intervention Not Indicated -- --  Alcohol Usage Interventions -- -- -- -- -- Alcohol Education/Brief Advice  [education provided by providers]  Financial Strain Interventions Other (Comment)  [patiewnt has been provided  with resources] -- -- -- -- --  Physical Activity Interventions -- Intervention Not Indicated  [spondylosis-unable to engage in this type of exercise] -- -- -- --  Stress Interventions -- -- [employer], Provide Counseling Offered [employer], Provide Counseling Offered [employer], Provide Counseling --  Social Connections Interventions Intervention Not Indicated -- -- -- -- --  Health Literacy Interventions -- Intervention Not Indicated -- -- -- --     BSW completed a telephone outreach with patient he states things are going well. BSW did provide patient with get care now information again. Patient states he has been having some back pain and is still working on getting his house together. No other resources are needed at this time.  Advanced Directives Status:  Not addressed in this encounter.  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 08/09/2020   Mesenteric lymphadenopathy 06/14/2020   Splenomegaly- mild  06/14/2020   History of cannabis abuse 06/14/2020   Recurrent major depressive disorder, in partial remission (HCC) 06/09/2020   Anxiety 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 06/09/2020   Alcohol use disorder, moderate, dependence (HCC) 06/09/2020   Frequent falls 05/23/2020   Foraminal stenosis of cervical region 05/09/2020  Diabetic polyneuropathy    ADHD (attention deficit hyperactivity disorder)    Generalized anxiety disorder    Major depressive disorder     Cervical spondylosis 09/22/2019   Essential hypertension, benign 01/28/2019   Alcoholic cirrhosis of liver 06/02/2018   Type 2 diabetes mellitus with hyperglycemia 04/25/2018    Conditions to be addressed/monitored per PCP order:   community resources  There are no care plans that you recently modified to display for this patient.   Follow up:  Patient agrees to Care Plan and Follow-up.  Plan: The  Patient has been provided with contact information for the Managed Medicaid care management team and has been advised to call with any health related questions or concerns.    Abelino Derrick, MHA [facility]  Managed [facility] Social Worker [phone]

## 2023-08-29 ENCOUNTER — Other Ambulatory Visit: Payer: Self-pay | Admitting: Obstetrics and Gynecology

## 2023-08-29 NOTE — Patient Outreach (Signed)
 Medicaid Managed Care   Nurse Care Manager Note  08/29/2023 Name:  Gerald Olson MRN:  [id number] DOB:  [birth year]  Gerald Olson is an 58 y.o. year old male who is a primary patient of Patient, [name].  The [facility] Managed Care Coordination team was consulted for assistance with:    Chronic healthcare management needs, HTN, DM, chronic pain, anxiety/depression/ADHD, h/o leukemia, polyneuropathy, cervical lumbar spondylosis, DLD  Gerald Olson was given information about Medicaid Managed Care Coordination team services today. Gerald Olson Patient agreed to services and verbal consent obtained.  Engaged with patient by telephone for follow up visit in response to provider referral for case management and/or care coordination services.   Patient is participating in a Managed Medicaid Plan:  Yes  Assessments/Interventions:  Review of past medical history, allergies, medications, health status, including review of consultants reports, laboratory and other test data, was performed as part of comprehensive evaluation and provision of chronic care management services.  SDOH (Social Drivers of Health) assessments and interventions performed: SDOH Interventions    Flowsheet Row Patient Outreach Telephone from 08/29/2023 in [place] [facility] POPULATION HEALTH DEPARTMENT Patient Outreach Telephone from 05/24/2023 in [place] POPULATION HEALTH DEPARTMENT Patient Outreach Telephone from 03/25/2023 in [place] POPULATION HEALTH DEPARTMENT Patient Outreach Telephone from 03/18/2023 in [place] POPULATION HEALTH DEPARTMENT Patient Outreach Telephone from 02/12/2023 in [place] POPULATION HEALTH DEPARTMENT Patient Outreach Telephone from 01/29/2023 in [place] POPULATION HEALTH DEPARTMENT  SDOH Interventions        Food Insecurity Interventions Other (Comment)  [Patient has been given resources by BSW] -- -- -- -- --  Housing Interventions -- -- -- -- -- --  Research Officer, Trade Union for [facility] BSW involvement]  Transportation  Interventions -- -- -- -- Intervention Not Indicated --  Utilities Interventions Intervention Not Indicated -- -- -- -- --  Financial Strain Interventions -- Other (Comment)  [patiewnt has been provided with resources] -- -- -- --  Physical Activity Interventions -- -- Intervention Not Indicated  [spondylosis-unable to engage in this type of exercise] -- -- --  Stress Interventions -- -- -- [employer], Provide Counseling Offered [employer], Provide Counseling Offered [employer], Provide Counseling  Social Connections Interventions -- Intervention Not Indicated -- -- -- --  Health Literacy Interventions -- -- Intervention Not Indicated -- -- --     Care Plan No Known Allergies  Medications Reviewed Today     Reviewed by Milon Veva MATSU, RN (Registered Nurse) on 08/29/23 at 1516  Med List Status: <None>   Medication Order Taking? Sig Documenting Provider Last Dose Status Informant  Blood Pressure Monitoring (BLOOD PRESSURE MONITOR AUTOMAT) DEVI [id number] No Check blood pressure in AM and PM daily Drubel, Manuelita, PA-C Taking Active   cyclobenzaprine  (FLEXERIL ) 5 MG tablet [id number] No Take 1 tablet (5 mg total) by mouth 3 (three) times daily as needed for muscle spasms. Urbano Albright, MD Taking Active   dapagliflozin  propanediol (FARXIGA ) 5 MG TABS tablet [id number]  Take 1 tablet (5 mg total) by mouth daily before breakfast. Trixie File, MD  Active   Patient not taking:  Discontinued 08/09/20 1309 (Patient Preference)   glucose blood (ACCU-CHEK GUIDE) test strip [id number] No USE AS DIRECTED 2 TIMES DAILY Trixie File, MD Taking Active   imatinib  (GLEEVEC ) 400 MG tablet [id number] No Take 1 tablet (400 mg total) by mouth daily. Tonette Lauraine HERO, PA-C Taking Active   insulin  glargine (LANTUS  SOLOSTAR) 100 UNIT/ML Solostar Pen [id number]  Inject 7 Units into the skin every morning. Trixie File, MD  Active   Insulin   Pen Needle (TECHLITE PEN NEEDLES) 32G X 4 MM MISC [id number] No Use to inject Victoza  once a day Trixie File, MD Taking Active   lisinopril  (ZESTRIL ) 20 MG tablet [id number] No Take 1 tablet (20 mg total) by mouth daily. Cyndi Shaver, PA-C Taking Active   pregabalin  (LYRICA ) 100 MG capsule [id number] No Take 1 capsule (100 mg total) by mouth 2 (two) times daily. 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 08/09/2020   Mesenteric lymphadenopathy 06/14/2020   Splenomegaly- mild  06/14/2020   History of cannabis abuse 06/14/2020   Recurrent major depressive disorder, in partial remission (HCC) 06/09/2020   Anxiety 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 06/09/2020   Alcohol use disorder, moderate, dependence (HCC) 06/09/2020   Frequent falls 05/23/2020   Foraminal stenosis of cervical region 05/09/2020   Diabetic polyneuropathy    ADHD (attention deficit hyperactivity disorder)    Generalized anxiety disorder    Major depressive disorder    Cervical spondylosis 09/22/2019   Essential hypertension, benign 01/28/2019   Alcoholic cirrhosis of liver 06/02/2018   Type 2 diabetes mellitus with hyperglycemia 04/25/2018   Conditions to be addressed/monitored per PCP order:  Chronic healthcare management needs, HTN, DM, chronic pain, anxiety/depression/ADHD, h/o leukemia, polyneuropathy, cervical lumbar spondylosis, DLD  Care Plan : RNCM Plan of Care  Updates made by Milon Veva MATSU, RN since 08/29/2023 12:00 AM      Problem: Chronic Disease Management and Care Coordination Needs for DM, HTN, CML and Back Pain      Long-Range Goal: Plan of Care for Chronic Disease management and Care Coordination Needs (HTN, DM, CML, Chronic Back Pain)   Start Date: 05/09/2021  Expected End Date: 11/27/2023  Priority: High  Note:   Current Barriers:  Knowledge Deficits related to plan of care for management of HTN, DMII, and CML and Chronic Back Pain  Chronic Disease Management support and education needs related to HTN, DMII, and CML and Chronic Back Pain 08/29/23:  Blood sugars 130-205.  Being followed by pain management and neurosurgery-to have MRI 1/17 and f/u 1/23.  RNCM Clinical Goal(s):  Patient will verbalize understanding of plan for management of HTN, DMII, and CML and Chronic Back Pain verbalize basic understanding of HTN, DMII, and CML and Chronic Back Pain disease process and self health management plan  take all medications exactly as prescribed and will call provider for medication related questions attend all scheduled medical appointments demonstrate ongoing adherence to prescribed treatment plan for HTN, DMII, and CML and Chronic Back Pain as evidenced by daily monitoring and recording of CBG, adherence to ADA/ carb modified diet, adherence to prescribed medication regimen, contacting provider for new or worsened symptoms or questions  demonstrate ongoing health management independence  continue to work with RN Care Manager to address care management and care coordination needs related to HTN, DMII, and CML and Chronic Back Pain  work with pharmacist to  address complex medication regimen  related to HTN, DMII, and CML and Chronic Back Pain work with child psychotherapist to address financial constraints related to overdue payment of back taxes and potential risk of losing home, Limited social support and counseling for anxiety/stress related to the management of HTN, DMII, and CML and Chronic Back  Pain. collaborate with the care management team towards completion of advanced directives  through collaboration with RN Care manager, provider, and care team.   Interventions: Inter-disciplinary care team collaboration (see longitudinal plan of care) Evaluation of current treatment plan related to  self management and patient's adherence to plan as established by provider Collaboration with PCP office for Tramadol  refill at patient request. BSW referral for resources-completed Collaborated with BSW LCSW referral for depression/trauma-completed Collaborated with LCSW 05/24/23: Rescheduled BSW appt.  Diabetes:  (Status: Goal on Track (progressing): YES.) Lab Results  Component Value Date   HGBA1C                                                                              6.6                                    04/04/22                                                                           6.7                                   06/04/23                                                                           6.4                                   11/08/22  Assessed patient's understanding of A1c goal: <6.5% Continue to provided education to patient about basic DM disease process; Reviewed medications with patient and discussed importance of medication adherence. Counseled on importance of regular laboratory monitoring as prescribed;        Discussed plans with patient for ongoing care management follow up and provided patient with direct contact information for care management team;      Reviewed scheduled/upcoming provider appointments including: See RNCM Clinical Goals above.       Call provider for findings outside established parameters.      Referral made to pharmacy team previously for assistance with complex medication  regimen.  Patient doing well with medication compliance and his understanding of all medications.  Pharmacist referral not needed at this time.       Review of patient  status, including review of consultants reports, relevant laboratory and other test results, and medications completed.  Patient understands the importance of drinking low sugar drinks and use of sugar substitutes.       Chronic Myeloid Leukemia (CML)  (Status: Goal on Track (progressing): YES.) Evaluation of current treatment plan related to  CML and patient's adherence to plan as established by provider.  Patient continues to be in remission and the plan is for patient to continue on Gleevec  400 mg daily.  Patient continues to take the Gleevec  with some protein to avoid having diarrhea.  Discussed plans with patient for ongoing care management follow up and provided patient with direct contact information for care management team Reviewed medications with patient and discussed the importance of taking all medications as prescribed; Pharmacy referral for complex medication regimen made previously.  [facility] Pharmacist not needed at this time. 07/13/22: ONC appt 07/04/22-WNL  Hypertension: (Status: Goal on Track (progressing): YES.) Last practice recorded Blood Pressure readings: BP Readings from Last 3 Encounters:  06/04/23     142/80 12/05/22:    140/82 03/25/23     120/73  Most recent eGFR/CrCl: No results found for: EGFR  No components found for: CRCL  Evaluation of current treatment plan related to hypertension self management and patient's adherence to plan as established by provider;   Reviewed medications with patient and discussed importance of compliance.  Discussed plans with patient for ongoing care management follow up and provided patient with direct contact information for care management team; Discussed patient's recent blood pressure readings at recent medical office visits.  Reviewed scheduled/upcoming provider appointments i Patient did obtain a blood pressure monitor through [facility].   Continued to re-enforce education on Orthostatic blood pressure changes that occur  normally when changing positions from lying to sitting to standing.  Patient continues to report he does experience some lightheaded with moving from a sitting to a standing position which only last a few seconds.  Patient checks his blood pressure and heart rate at time of episode. Patient suspects caffeine may be making heart rate elevated. Instructed patient to change positions slowly and continue to monitor. Patient verbalized understanding.  Pain:  (Status: Goal on Track (progressing): YES.) Pain assessment performed.  Patient reports chronic neck & back pain.Currently using Ibuprofen  600 mg PRN during the day and Tramadol  at night with fair relief - able to sleep only 2 hours at a stretch.  Pain interferes with sleep due to staying in same position for 2 hours.  Per patient, his pain is either triggered by staying in same position for a prolonged period of time or by moving too much. Medications reviewed.  Reviewed provider established plan for pain management; Discussed importance of adherence to all scheduled medical appointments; Patient continues to report having Neuropathy on his left side of his body (arm and leg) causing numbness and lack of coordination with movement.    Will continue to monitor.    Pain better with Qutenza  patch  Patient Goals/Self-Care Activities: Patient will self administer medications as prescribed as evidenced by self report/primary caregiver report  Patient will attend all scheduled provider appointments as evidenced by clinician review of documented attendance to scheduled appointments and patient/caregiver report Patient will call pharmacy for medication refills as evidenced by patient report and review of  pharmacy fill history as appropriate Patient will continue to perform ADL's independently as evidenced by patient/caregiver report Patient will call provider office for new concerns or questions as evidenced by review of documented incoming telephone call  notes and patient report Patient will work with BSW to address care coordination needs and will continue to work with the clinical team to address health care and disease management related needs as evidenced by documented adherence to scheduled care management/care coordination appointments - check blood sugar at prescribed times: twice daily - enter blood sugar readings and medication or insulin  into daily log - take the blood sugar log to all doctor visits - check blood pressure weekly - write blood pressure results in a log or diary - keep a blood pressure log - take blood pressure log to all doctor appointments - call doctor for signs and symptoms of high blood pressure - keep all doctor appointments - take medications for blood pressure exactly as prescribed   Follow Up:  Patient agrees to Care Plan and Follow-up.  Plan: The Managed Medicaid care management team will reach out to the patient again over the next 30 business  days. and The  Patient has been provided with contact information for the Managed Medicaid care management team and has been advised to call with any health related questions or concerns.  Date/time of next scheduled RN care management/care coordination outreach:  10/14/23 at 315.

## 2023-08-29 NOTE — Patient Instructions (Signed)
 Hi Mr. Cianci, Have a nice rest of the day and hope MRI goes okay.  Mr. Arakawa was given information about Medicaid Managed Care team care coordination services as a part of their Healthy [facility] Medicaid benefit. [name] verbally consented to engagement with the [facility] Managed Care team.   If you are experiencing a medical emergency, please call 911 or report to your local emergency department or urgent care.   If you have a non-emergency medical problem during routine business hours, please contact your provider's office and ask to speak with a nurse.   For questions related to your Healthy [facility] health plan, please call: [phone] or visit the homepage here: [email or web address]  If you would like to schedule transportation through your Healthy [facility] plan, please call the following number at least 2 days in advance of your appointment: [phone]  For information about your ride after you set it up, call Ride Assist at [phone]. Use this number to activate a Will Call pickup, or if your transportation is late for a scheduled pickup. Use this number, too, if you need to make a change or cancel a previously scheduled reservation.  If you need transportation services right away, call [phone]. The after-hours call center is staffed 24 hours to handle ride assistance and urgent reservation requests (including discharges) 365 days a year. Urgent trips include sick visits, hospital discharge requests and life-sustaining treatment.  Call the [facility] Line at [phone], at any time, 24 hours a day, 7 days a week. If you are in danger or need immediate medical attention call 911.  If you would like help to quit smoking, call 1-800-QUIT-NOW (484-398-3178) OR Espaol: 1-855-Djelo-Ya (8-144-664-6430) o para ms informacin haga clic aqu or Text READY to 799-599 to register via text  Mr. Lardizabal - following are the goals  we discussed in your visit today:   Goals Addressed             This Visit's Progress    RNCM - CML Disease Progression Minimized or Managed       Timeframe:  Long-Range Goal Priority:  High Start Date:     05/09/2021                       Expected End Date: ongoing  Patient Goals: Patient will self administer medications as prescribed Patient will attend all scheduled provider appointments Patient will call pharmacy for medication refills Patient will continue to perform ADL's independently Patient will continue to perform IADL's independently Patient will call provider office for new concerns or questions Patient will work with BSW to address care coordination needs and will continue to work with the clinical team to address health care and disease management related needs.  08/29/23:  Followed by ONC.-remission                       RNCM - Glycemic Management & Monitored       Timeframe:  Long-Range Goal Priority:  High Start Date:     05/09/2021                        Expected End Date:  ongoing  Patient Goals: Patient will self administer medications as prescribed Patient will attend all scheduled provider appointments Patient will call pharmacy for medication refills Patient will continue to perform ADL's independently Patient will continue to perform IADL's independently Patient will call provider office for  new concerns or questions Patient will work with BSW to address care coordination needs and will continue to work with the clinical team to address health care and disease management related needs.      Patient monitor blood sugars twice a day at varies times using Accu-check guide and record results. A1C level will remain at 6.0 or below.    08/29/23:  Blood sugars 130-205.  A1C 6.7 on 10/8.  Saw ENDO 10/8    RNCM - Hypertension Monitored and Managed       Timeframe:  Long-Range Goal Priority:  High Start Date:  05/09/2021                           Expected End Date:   ongoing  Patient Goals Patient will self administer medications as prescribed Patient will attend all scheduled provider appointments Patient will call pharmacy for medication refills Patient will continue to perform ADL's independently Patient will continue to perform IADL's independently Patient will call provider office for new concerns or questions Patient will work with BSW to address care coordination needs and will continue to work with the clinical team to address health care and disease management related needs.    Patient will check blood pressure with new blood pressure monitor at least once a week and record readings.          08/29/23: 142/80 at PCP appt 10/8.   Patient verbalizes understanding of instructions and care plan provided today and agrees to view in MyChart. Active MyChart status and patient understanding of how to access instructions and care plan via MyChart confirmed with patient.     The Managed Medicaid care management team will reach out to the patient again over the next 45 business  days.  The  Patient has been provided with contact information for the Managed Medicaid care management team and has been advised to call with any health related questions or concerns.   Veva Angles RN, BSN [place]  [facility] Care Management Coordinator - Managed Medicaid High Risk [phone]   Following is a copy of your plan of care:  Care Plan : RNCM Plan of Care  Updates made by Angles Veva MATSU, RN since 08/29/2023 12:00 AM     Problem: Chronic Disease Management and Care Coordination Needs for DM, HTN, CML and Back Pain      Long-Range Goal: Plan of Care for Chronic Disease management and Care Coordination Needs (HTN, DM, CML, Chronic Back Pain)   Start Date: 05/09/2021  Expected End Date: 11/27/2023  Priority: High  Note:   Current Barriers:  Knowledge Deficits related to plan of care for management of HTN, DMII, and CML and Chronic Back Pain   Chronic Disease Management support and education needs related to HTN, DMII, and CML and Chronic Back Pain 08/29/23:  Blood sugars 130-205.  Being followed by pain management and neurosurgery-to have MRI 1/17 and f/u 1/23.  RNCM Clinical Goal(s):  Patient will verbalize understanding of plan for management of HTN, DMII, and CML and Chronic Back Pain verbalize basic understanding of HTN, DMII, and CML and Chronic Back Pain disease process and self health management plan  take all medications exactly as prescribed and will call provider for medication related questions attend all scheduled medical appointments demonstrate ongoing adherence to prescribed treatment plan for HTN, DMII, and CML and Chronic Back Pain as evidenced by daily monitoring and recording of CBG, adherence to ADA/ carb  modified diet, adherence to prescribed medication regimen, contacting provider for new or worsened symptoms or questions  demonstrate ongoing health management independence  continue to work with RN Care Manager to address care management and care coordination needs related to HTN, DMII, and CML and Chronic Back Pain  work with pharmacist to address complex medication regimen  related to HTN, DMII, and CML and Chronic Back Pain work with child psychotherapist to address financial constraints related to overdue payment of back taxes and potential risk of losing home, Limited social support and counseling for anxiety/stress related to the management of HTN, DMII, and CML and Chronic Back Pain. collaborate with the care management team towards completion of advanced directives  through collaboration with RN Care manager, provider, and care team.   Interventions: Inter-disciplinary care team collaboration (see longitudinal plan of care) Evaluation of current treatment plan related to  self management and patient's adherence to plan as established by provider Collaboration with PCP office for Tramadol  refill at patient  request. BSW referral for resources-completed Collaborated with BSW LCSW referral for depression/trauma-completed Collaborated with LCSW 05/24/23: Rescheduled BSW appt.  Diabetes:  (Status: Goal on Track (progressing): YES.) Lab Results  Component Value Date   HGBA1C                                                                              6.6                                    04/04/22                                                                           6.7                                   06/04/23                                                                           6.4                                   11/08/22  Assessed patient's understanding of A1c goal: <6.5% Continue to provided education to patient about basic DM disease process; Reviewed medications with patient and discussed importance of medication adherence. Counseled on importance of regular laboratory monitoring as prescribed;        Discussed plans with patient for ongoing care management follow up and provided  patient with direct contact information for care management team;      Reviewed scheduled/upcoming provider appointments including: See RNCM Clinical Goals above.       Call provider for findings outside established parameters.      Referral made to pharmacy team previously for assistance with complex medication regimen.  Patient doing well with medication compliance and his understanding of all medications.  Pharmacist referral not needed at this time.       Review of patient status, including review of consultants reports, relevant laboratory and other test results, and medications completed.  Patient understands the importance of drinking low sugar drinks and use of sugar substitutes.       Chronic Myeloid Leukemia (CML)  (Status: Goal on Track (progressing): YES.) Evaluation of current treatment plan related to  CML and patient's adherence to plan as established by provider.  Patient continues to be in  remission and the plan is for patient to continue on Gleevec  400 mg daily.  Patient continues to take the Gleevec  with some protein to avoid having diarrhea.  Discussed plans with patient for ongoing care management follow up and provided patient with direct contact information for care management team Reviewed medications with patient and discussed the importance of taking all medications as prescribed; Pharmacy referral for complex medication regimen made previously.  [facility] Pharmacist not needed at this time. 07/13/22: ONC appt 07/04/22-WNL  Hypertension: (Status: Goal on Track (progressing): YES.) Last practice recorded Blood Pressure readings: BP Readings from Last 3 Encounters:  06/04/23     142/80 12/05/22:    140/82 03/25/23     120/73  Most recent eGFR/CrCl: No results found for: EGFR  No components found for: CRCL  Evaluation of current treatment plan related to hypertension self management and patient's adherence to plan as established by provider;   Reviewed medications with patient and discussed importance of compliance.  Discussed plans with patient for ongoing care management follow up and provided patient with direct contact information for care management team; Discussed patient's recent blood pressure readings at recent medical office visits.  Reviewed scheduled/upcoming provider appointments i Patient did obtain a blood pressure monitor through [facility].   Continued to re-enforce education on Orthostatic blood pressure changes that occur normally when changing positions from lying to sitting to standing.  Patient continues to report he does experience some lightheaded with moving from a sitting to a standing position which only last a few seconds.  Patient checks his blood pressure and heart rate at time of episode. Patient suspects caffeine may be making heart rate elevated. Instructed patient to change positions slowly and continue to monitor. Patient verbalized  understanding.  Pain:  (Status: Goal on Track (progressing): YES.) Pain assessment performed.  Patient reports chronic neck & back pain.Currently using Ibuprofen  600 mg PRN during the day and Tramadol  at night with fair relief - able to sleep only 2 hours at a stretch.  Pain interferes with sleep due to staying in same position for 2 hours.  Per patient, his pain is either triggered by staying in same position for a prolonged period of time or by moving too much. Medications reviewed.  Reviewed provider established plan for pain management; Discussed importance of adherence to all scheduled medical appointments; Patient continues to report having Neuropathy on his left side of his body (arm and leg) causing numbness and lack of coordination with movement.    Will continue to monitor.    Pain better with Qutenza  patch  Patient  Goals/Self-Care Activities: Patient will self administer medications as prescribed as evidenced by self report/primary caregiver report  Patient will attend all scheduled provider appointments as evidenced by clinician review of documented attendance to scheduled appointments and patient/caregiver report Patient will call pharmacy for medication refills as evidenced by patient report and review of pharmacy fill history as appropriate Patient will continue to perform ADL's independently as evidenced by patient/caregiver report Patient will call provider office for new concerns or questions as evidenced by review of documented incoming telephone call notes and patient report Patient will work with BSW to address care coordination needs and will continue to work with the clinical team to address health care and disease management related needs as evidenced by documented adherence to scheduled care management/care coordination appointments - check blood sugar at prescribed times: twice daily - enter blood sugar readings and medication or insulin  into daily log - take the blood  sugar log to all doctor visits - check blood pressure weekly - write blood pressure results in a log or diary - keep a blood pressure log - take blood pressure log to all doctor appointments - call doctor for signs and symptoms of high blood pressure - keep all doctor appointments - take medications for blood pressure exactly as prescribed

## 2023-08-30 ENCOUNTER — Encounter: Payer: Medicaid Other | Attending: Physical Medicine & Rehabilitation | Admitting: Physical Medicine & Rehabilitation

## 2023-09-12 ENCOUNTER — Other Ambulatory Visit: Payer: Medicaid Other

## 2023-09-12 DIAGNOSIS — C9590 Leukemia, unspecified not having achieved remission: Secondary | ICD-10-CM | POA: Diagnosis not present

## 2023-09-12 DIAGNOSIS — M5136 Other intervertebral disc degeneration, lumbar region with discogenic back pain only: Secondary | ICD-10-CM | POA: Diagnosis not present

## 2023-09-12 DIAGNOSIS — M48061 Spinal stenosis, lumbar region without neurogenic claudication: Secondary | ICD-10-CM | POA: Diagnosis not present

## 2023-09-12 DIAGNOSIS — M4317 Spondylolisthesis, lumbosacral region: Secondary | ICD-10-CM | POA: Diagnosis not present

## 2023-09-12 DIAGNOSIS — M5126 Other intervertebral disc displacement, lumbar region: Secondary | ICD-10-CM | POA: Diagnosis not present

## 2023-09-18 DIAGNOSIS — Z6828 Body mass index (BMI) 28.0-28.9, adult: Secondary | ICD-10-CM | POA: Diagnosis not present

## 2023-09-18 DIAGNOSIS — M4317 Spondylolisthesis, lumbosacral region: Secondary | ICD-10-CM | POA: Diagnosis not present

## 2023-09-26 ENCOUNTER — Telehealth: Payer: Medicaid Other | Admitting: Oncology

## 2023-09-30 ENCOUNTER — Encounter: Payer: Medicaid Other | Admitting: Physical Medicine & Rehabilitation

## 2023-10-01 ENCOUNTER — Other Ambulatory Visit: Payer: Self-pay | Admitting: Neurological Surgery

## 2023-10-01 ENCOUNTER — Ambulatory Visit: Payer: Medicaid Other | Attending: Physical Medicine & Rehabilitation

## 2023-10-01 NOTE — Pre-Procedure Instructions (Signed)
 Surgical Instructions   Your procedure is scheduled on Monday, February 10th. Report to [facility] Main Entrance A at 08:00 A.M., then check in with the Admitting office. Any questions or running late day of surgery: call [phone]  Questions prior to your surgery date: call [phone], Monday-Friday, 8am-4pm. If you experience any cold or flu symptoms such as cough, fever, chills, shortness of breath, etc. between now and your scheduled surgery, please notify us  at the above number.     Remember:  Do not eat after midnight the night before your surgery  You may drink clear liquids until 07:00 AM the morning of your surgery.   Clear liquids allowed are: Water, Non-Citrus Juices (without pulp), Carbonated Beverages, Clear Tea (no milk, honey, etc.), Black Coffee Only (NO MILK, CREAM OR POWDERED CREAMER of any kind), and Gatorade.    Take these medicines the morning of surgery with A SIP OF WATER  May take these medicines IF NEEDED: cyclobenzaprine  (FLEXERIL )  sodium chloride  (OCEAN) nasal spray  One week prior to surgery, STOP taking any Aspirin (unless otherwise instructed by your surgeon) Aleve, Naproxen, Ibuprofen , Motrin , Advil , Goody's, BC's, all herbal medications, fish oil, and non-prescription vitamins.  WHAT DO I DO ABOUT MY DIABETES MEDICATION?   Hold dapagliflozin  propanediol (FARXIGA ) for 72 hours prior to surgery. Last dose 2/6.   THE MORNING OF SURGERY, take 3.5 units (50%) of insulin  glargine (LANTUS  SOLOSTAR).   HOW TO MANAGE YOUR DIABETES BEFORE AND AFTER SURGERY  Why is it important to control my blood sugar before and after surgery? Improving blood sugar levels before and after surgery helps healing and can limit problems. A way of improving blood sugar control is eating a healthy diet by:  Eating less sugar and carbohydrates  Increasing activity/exercise  Talking with your doctor about reaching your blood sugar goals High blood sugars (greater than  180 mg/dL) can raise your risk of infections and slow your recovery, so you will need to focus on controlling your diabetes during the weeks before surgery. Make sure that the doctor who takes care of your diabetes knows about your planned surgery including the date and location.  How do I manage my blood sugar before surgery? Check your blood sugar at least 4 times a day, starting 2 days before surgery, to make sure that the level is not too high or low.  Check your blood sugar the morning of your surgery when you wake up and every 2 hours until you get to the Short Stay unit.  If your blood sugar is less than 70 mg/dL, you will need to treat for low blood sugar: Do not take insulin . Treat a low blood sugar (less than 70 mg/dL) with  cup of clear juice (cranberry or apple), 4 glucose tablets, OR glucose gel. Recheck blood sugar in 15 minutes after treatment (to make sure it is greater than 70 mg/dL). If your blood sugar is not greater than 70 mg/dL on recheck, call [phone] for further instructions. Report your blood sugar to the short stay nurse when you get to Short Stay.  If you are admitted to the hospital after surgery: Your blood sugar will be checked by the staff and you will probably be given insulin  after surgery (instead of oral diabetes medicines) to make sure you have good blood sugar levels. The goal for blood sugar control after surgery is 80-180 mg/dL.  Do NOT Smoke (Tobacco/Vaping) for 24 hours prior to your procedure.  If you use a CPAP at night, you may bring your mask/headgear for your overnight stay.   You will be asked to remove any contacts, glasses, piercing's, hearing aid's, dentures/partials prior to surgery. Please bring cases for these items if needed.    Patients discharged the day of surgery will not be allowed to drive home, and someone needs to stay with them for 24 hours.  SURGICAL WAITING ROOM VISITATION Patients may have no more  than 2 support people in the waiting area - these visitors may rotate.   Pre-op nurse will coordinate an appropriate time for 1 ADULT support person, who may not rotate, to accompany patient in pre-op.  Children under the age of 79 must have an adult with them who is not the patient and must remain in the main waiting area with an adult.  If the patient needs to stay at the hospital during part of their recovery, the visitor guidelines for inpatient rooms apply.  Please refer to the [facility] website for the visitor guidelines for any additional information.   If you received a COVID test during your pre-op visit  it is requested that you wear a mask when out in public, stay away from anyone that may not be feeling well and notify your surgeon if you develop symptoms. If you have been in contact with anyone that has tested positive in the last 10 days please notify you surgeon.      Pre-operative 5 CHG Bathing Instructions   You can play a key role in reducing the risk of infection after surgery. Your skin needs to be as free of germs as possible. You can reduce the number of germs on your skin by washing with CHG (chlorhexidine  gluconate) soap before surgery. CHG is an antiseptic soap that kills germs and continues to kill germs even after washing.   DO NOT use if you have an allergy to chlorhexidine /CHG or antibacterial soaps. If your skin becomes reddened or irritated, stop using the CHG and notify one of our RNs at [phone].   Please shower with the CHG soap starting 4 days before surgery using the following schedule:     Please keep in mind the following:  DO NOT shave, including legs and underarms, starting the day of your first shower.   You may shave your face at any point before/day of surgery.  Place clean sheets on your bed the day you start using CHG soap. Use a clean washcloth (not used since being washed) for each shower. DO NOT sleep with pets once you start using  the CHG.   CHG Shower Instructions:  Wash your face and private area with normal soap. If you choose to wash your hair, wash first with your normal shampoo.  After you use shampoo/soap, rinse your hair and body thoroughly to remove shampoo/soap residue.  Turn the water OFF and apply about 3 tablespoons (45 ml) of CHG soap to a CLEAN washcloth.  Apply CHG soap ONLY FROM YOUR NECK DOWN TO YOUR TOES (washing for 3-5 minutes)  DO NOT use CHG soap on face, private areas, open wounds, or sores.  Pay special attention to the area where your surgery is being performed.  If you are having back surgery, having someone wash your back for you may be helpful. Wait 2 minutes after CHG soap is applied, then you may rinse off the CHG soap.  Pat dry with a  clean towel  Put on clean clothes/pajamas   If you choose to wear lotion, please use ONLY the CHG-compatible lotions that are listed below.  Additional instructions for the day of surgery: DO NOT APPLY any lotions, deodorants, cologne, or perfumes.   Do not bring valuables to the hospital. [facility] is not responsible for any belongings/valuables. Do not wear nail polish, gel polish, artificial nails, or any other type of covering on natural nails (fingers and toes) Do not wear jewelry or makeup Put on clean/comfortable clothes.  Please brush your teeth.  Ask your nurse before applying any prescription medications to the skin.     CHG Compatible Lotions   Aveeno Moisturizing lotion  Cetaphil Moisturizing Cream  Cetaphil Moisturizing Lotion  Clairol Herbal Essence Moisturizing Lotion, Dry Skin  Clairol Herbal Essence Moisturizing Lotion, Extra Dry Skin  Clairol Herbal Essence Moisturizing Lotion, Normal Skin  Curel Age Defying Therapeutic Moisturizing Lotion with Alpha Hydroxy  Curel Extreme Care Body Lotion  Curel Soothing Hands Moisturizing Hand Lotion  Curel Therapeutic Moisturizing Cream, Fragrance-Free  Curel Therapeutic Moisturizing  Lotion, Fragrance-Free  Curel Therapeutic Moisturizing Lotion, Original Formula  Eucerin Daily Replenishing Lotion  Eucerin Dry Skin Therapy Plus Alpha Hydroxy Crme  Eucerin Dry Skin Therapy Plus Alpha Hydroxy Lotion  Eucerin Original Crme  Eucerin Original Lotion  Eucerin Plus Crme Eucerin Plus Lotion  Eucerin TriLipid Replenishing Lotion  Keri Anti-Bacterial Hand Lotion  Keri Deep Conditioning Original Lotion Dry Skin Formula Softly Scented  Keri Deep Conditioning Original Lotion, Fragrance Free Sensitive Skin Formula  Keri Lotion Fast Absorbing Fragrance Free Sensitive Skin Formula  Keri Lotion Fast Absorbing Softly Scented Dry Skin Formula  Keri Original Lotion  Keri Skin Renewal Lotion Keri Silky Smooth Lotion  Keri Silky Smooth Sensitive Skin Lotion  Nivea Body Creamy Conditioning Oil  Nivea Body Extra Enriched Lotion  Nivea Body Original Lotion  Nivea Body Sheer Moisturizing Lotion Nivea Crme  Nivea Skin Firming Lotion  NutraDerm 30 Skin Lotion  NutraDerm Skin Lotion  NutraDerm Therapeutic Skin Cream  NutraDerm Therapeutic Skin Lotion  ProShield Protective Hand Cream  Provon moisturizing lotion  Please read over the following fact sheets that you were given.

## 2023-10-02 ENCOUNTER — Other Ambulatory Visit: Payer: Self-pay

## 2023-10-02 ENCOUNTER — Encounter (HOSPITAL_COMMUNITY)
Admission: RE | Admit: 2023-10-02 | Discharge: 2023-10-02 | Disposition: A | Discharge status: Home / Self Care | Payer: Medicaid Other | Source: Ambulatory Visit | Attending: Neurological Surgery | Admitting: Neurological Surgery

## 2023-10-02 ENCOUNTER — Encounter (HOSPITAL_COMMUNITY): Payer: Self-pay

## 2023-10-02 VITALS — BP 142/89 | HR 95 | Temp 98.7°F | Resp 18 | Ht 74.0 in | Wt 226.6 lb

## 2023-10-02 DIAGNOSIS — K7469 Other cirrhosis of liver: Secondary | ICD-10-CM

## 2023-10-02 DIAGNOSIS — R9431 Abnormal electrocardiogram [ECG] [EKG]: Secondary | ICD-10-CM | POA: Insufficient documentation

## 2023-10-02 DIAGNOSIS — E119 Type 2 diabetes mellitus without complications: Secondary | ICD-10-CM

## 2023-10-02 DIAGNOSIS — Z794 Long term (current) use of insulin: Secondary | ICD-10-CM | POA: Insufficient documentation

## 2023-10-02 DIAGNOSIS — Z01818 Encounter for other preprocedural examination: Secondary | ICD-10-CM | POA: Diagnosis not present

## 2023-10-02 HISTORY — DX: Ankylosing hyperostosis (forestier), site unspecified: M48.10

## 2023-10-02 LAB — SURGICAL PCR SCREEN
MRSA, PCR: NEGATIVE
Staphylococcus aureus: POSITIVE — AB

## 2023-10-02 LAB — COMPREHENSIVE METABOLIC PANEL
ALT: 64 U/L — ABNORMAL HIGH (ref 0–44)
AST: 74 U/L — ABNORMAL HIGH (ref 15–41)
Albumin: 3.5 g/dL (ref 3.5–5.0)
Alkaline Phosphatase: 65 U/L (ref 38–126)
Anion gap: 8 (ref 5–15)
BUN: 10 mg/dL (ref 6–20)
CO2: 25 mmol/L (ref 22–32)
Calcium: 8.6 mg/dL — ABNORMAL LOW (ref 8.9–10.3)
Chloride: 101 mmol/L (ref 98–111)
Creatinine, Ser: 1.08 mg/dL (ref 0.61–1.24)
GFR, Estimated: >60 mL/min (ref >60)
Glucose, Bld: 172 mg/dL — ABNORMAL HIGH (ref 70–99)
Potassium: 4.3 mmol/L (ref 3.5–5.1)
Sodium: 134 mmol/L — ABNORMAL LOW (ref 135–145)
Total Bilirubin: 0.9 mg/dL (ref 0.0–1.2)
Total Protein: 6 g/dL — ABNORMAL LOW (ref 6.5–8.1)

## 2023-10-02 LAB — CBC
HCT: 41.5 % (ref 39.0–52.0)
Hemoglobin: 12.6 g/dL — ABNORMAL LOW (ref 13.0–17.0)
MCH: 25 pg — ABNORMAL LOW (ref 26.0–34.0)
MCHC: 30.4 g/dL (ref 30.0–36.0)
MCV: 82.2 fL (ref 80.0–100.0)
Platelets: 344 10*3/uL (ref 150–400)
RBC: 5.05 MIL/uL (ref 4.22–5.81)
RDW: 15.2 % (ref 11.5–15.5)
WBC: 6.5 10*3/uL (ref 4.0–10.5)
nRBC: 0 % (ref 0.0–0.2)

## 2023-10-02 LAB — HEMOGLOBIN A1C
Hgb A1c MFr Bld: 6.7 % — ABNORMAL HIGH (ref 4.8–5.6)
Mean Plasma Glucose: 145.59 mg/dL

## 2023-10-02 LAB — GLUCOSE, CAPILLARY: Glucose-Capillary: 193 mg/dL — ABNORMAL HIGH (ref 70–99)

## 2023-10-02 NOTE — Progress Notes (Addendum)
 PCP - denies Cardiologist - denies Endocrinologist- Dr. Lela Fendt Oncologist- Dr. Evalene Reusing  PPM/ICD - denies  Chest x-ray - denies EKG - 10/02/23 Stress Test - denies ECHO - denies Cardiac Cath - denies  Sleep Study - denies   Fasting Blood Sugar - 130-140 Checks Blood Sugar once/week  Last dose of GLP1 agonist-  n/a    ASA/Blood Thinner Instructions: n/a   ERAS Protcol - yes, no drink   COVID TEST- n/a   Anesthesia review: yes  Patient denies shortness of breath, fever, cough and chest pain at PAT appointment   All instructions explained to the patient, with a verbal understanding of the material. Patient agrees to go over the instructions while at home for a better understanding.  The opportunity to ask questions was provided.

## 2023-10-03 ENCOUNTER — Other Ambulatory Visit: Payer: Self-pay | Admitting: *Deleted

## 2023-10-03 DIAGNOSIS — C9210 Chronic myeloid leukemia, BCR/ABL-positive, not having achieved remission: Secondary | ICD-10-CM

## 2023-10-04 ENCOUNTER — Inpatient Hospital Stay: Payer: Medicaid Other | Attending: Oncology

## 2023-10-04 DIAGNOSIS — C9211 Chronic myeloid leukemia, BCR/ABL-positive, in remission: Secondary | ICD-10-CM | POA: Diagnosis not present

## 2023-10-04 DIAGNOSIS — R161 Splenomegaly, not elsewhere classified: Secondary | ICD-10-CM | POA: Diagnosis not present

## 2023-10-04 DIAGNOSIS — M549 Dorsalgia, unspecified: Secondary | ICD-10-CM | POA: Diagnosis not present

## 2023-10-04 DIAGNOSIS — C9210 Chronic myeloid leukemia, BCR/ABL-positive, not having achieved remission: Secondary | ICD-10-CM

## 2023-10-04 DIAGNOSIS — M542 Cervicalgia: Secondary | ICD-10-CM | POA: Insufficient documentation

## 2023-10-04 DIAGNOSIS — Z87891 Personal history of nicotine dependence: Secondary | ICD-10-CM | POA: Insufficient documentation

## 2023-10-04 LAB — CBC WITH DIFFERENTIAL/PLATELET
Abs Immature Granulocytes: 0.01 10*3/uL (ref 0.00–0.07)
Basophils Absolute: 0 10*3/uL (ref 0.0–0.1)
Basophils Relative: 1 %
Eosinophils Absolute: 0.1 10*3/uL (ref 0.0–0.5)
Eosinophils Relative: 2 %
HCT: 39.6 % (ref 39.0–52.0)
Hemoglobin: 12.3 g/dL — ABNORMAL LOW (ref 13.0–17.0)
Immature Granulocytes: 0 %
Lymphocytes Relative: 20 %
Lymphs Abs: 1 10*3/uL (ref 0.7–4.0)
MCH: 25.2 pg — ABNORMAL LOW (ref 26.0–34.0)
MCHC: 31.1 g/dL (ref 30.0–36.0)
MCV: 81 fL (ref 80.0–100.0)
Monocytes Absolute: 0.6 10*3/uL (ref 0.1–1.0)
Monocytes Relative: 12 %
Neutro Abs: 3.3 10*3/uL (ref 1.7–7.7)
Neutrophils Relative %: 65 %
Platelets: 309 10*3/uL (ref 150–400)
RBC: 4.89 MIL/uL (ref 4.22–5.81)
RDW: 15 % (ref 11.5–15.5)
WBC: 5 10*3/uL (ref 4.0–10.5)
nRBC: 0 % (ref 0.0–0.2)

## 2023-10-04 LAB — CMP (CANCER CENTER ONLY)
ALT: 46 U/L — ABNORMAL HIGH (ref 0–44)
AST: 43 U/L — ABNORMAL HIGH (ref 15–41)
Albumin: 3.5 g/dL (ref 3.5–5.0)
Alkaline Phosphatase: 74 U/L (ref 38–126)
Anion gap: 6 (ref 5–15)
BUN: 15 mg/dL (ref 6–20)
CO2: 22 mmol/L (ref 22–32)
Calcium: 8.2 mg/dL — ABNORMAL LOW (ref 8.9–10.3)
Chloride: 104 mmol/L (ref 98–111)
Creatinine: 0.79 mg/dL (ref 0.61–1.24)
GFR, Estimated: >60 mL/min (ref >60)
Glucose, Bld: 195 mg/dL — ABNORMAL HIGH (ref 70–99)
Potassium: 4 mmol/L (ref 3.5–5.1)
Sodium: 132 mmol/L — ABNORMAL LOW (ref 135–145)
Total Bilirubin: 0.5 mg/dL (ref 0.0–1.2)
Total Protein: 5.7 g/dL — ABNORMAL LOW (ref 6.5–8.1)

## 2023-10-04 LAB — MAGNESIUM: Magnesium: 2.2 mg/dL (ref 1.7–2.4)

## 2023-10-04 LAB — PHOSPHORUS: Phosphorus: 3.8 mg/dL (ref 2.5–4.6)

## 2023-10-07 ENCOUNTER — Ambulatory Visit (HOSPITAL_BASED_OUTPATIENT_CLINIC_OR_DEPARTMENT_OTHER): Payer: Medicaid Other

## 2023-10-07 ENCOUNTER — Ambulatory Visit (HOSPITAL_COMMUNITY): Payer: Medicaid Other | Admitting: Physician Assistant

## 2023-10-07 ENCOUNTER — Observation Stay (HOSPITAL_COMMUNITY)
Admission: RE | Admit: 2023-10-07 | Discharge: 2023-10-08 | Disposition: A | Discharge status: Home / Self Care | Payer: Medicaid Other | Attending: Neurological Surgery | Admitting: Neurological Surgery

## 2023-10-07 ENCOUNTER — Other Ambulatory Visit: Payer: Self-pay

## 2023-10-07 ENCOUNTER — Ambulatory Visit (HOSPITAL_COMMUNITY): Payer: Medicaid Other

## 2023-10-07 ENCOUNTER — Other Ambulatory Visit (HOSPITAL_COMMUNITY): Payer: Self-pay

## 2023-10-07 ENCOUNTER — Encounter (HOSPITAL_COMMUNITY)
Admission: RE | Disposition: A | Discharge status: Home / Self Care | Payer: Self-pay | Source: Home / Self Care | Attending: Neurological Surgery

## 2023-10-07 ENCOUNTER — Encounter (HOSPITAL_COMMUNITY): Payer: Self-pay | Admitting: Neurological Surgery

## 2023-10-07 DIAGNOSIS — Z9689 Presence of other specified functional implants: Secondary | ICD-10-CM | POA: Diagnosis not present

## 2023-10-07 DIAGNOSIS — E119 Type 2 diabetes mellitus without complications: Secondary | ICD-10-CM | POA: Diagnosis not present

## 2023-10-07 DIAGNOSIS — M48061 Spinal stenosis, lumbar region without neurogenic claudication: Principal | ICD-10-CM | POA: Insufficient documentation

## 2023-10-07 DIAGNOSIS — Z794 Long term (current) use of insulin: Secondary | ICD-10-CM | POA: Diagnosis not present

## 2023-10-07 DIAGNOSIS — M47816 Spondylosis without myelopathy or radiculopathy, lumbar region: Secondary | ICD-10-CM | POA: Diagnosis not present

## 2023-10-07 DIAGNOSIS — Z79899 Other long term (current) drug therapy: Secondary | ICD-10-CM | POA: Diagnosis not present

## 2023-10-07 DIAGNOSIS — Z87891 Personal history of nicotine dependence: Secondary | ICD-10-CM | POA: Diagnosis not present

## 2023-10-07 DIAGNOSIS — I10 Essential (primary) hypertension: Secondary | ICD-10-CM | POA: Diagnosis not present

## 2023-10-07 DIAGNOSIS — M47896 Other spondylosis, lumbar region: Secondary | ICD-10-CM | POA: Diagnosis not present

## 2023-10-07 DIAGNOSIS — Z85828 Personal history of other malignant neoplasm of skin: Secondary | ICD-10-CM | POA: Diagnosis not present

## 2023-10-07 DIAGNOSIS — M7138 Other bursal cyst, other site: Secondary | ICD-10-CM

## 2023-10-07 DIAGNOSIS — M4726 Other spondylosis with radiculopathy, lumbar region: Principal | ICD-10-CM | POA: Insufficient documentation

## 2023-10-07 HISTORY — PX: LUMBAR LAMINECTOMY/DECOMPRESSION MICRODISCECTOMY: SHX5026

## 2023-10-07 LAB — GLUCOSE, CAPILLARY
Glucose-Capillary: 137 mg/dL — ABNORMAL HIGH (ref 70–99)
Glucose-Capillary: 137 mg/dL — ABNORMAL HIGH (ref 70–99)
Glucose-Capillary: 273 mg/dL — ABNORMAL HIGH (ref 70–99)
Glucose-Capillary: 196 mg/dL — ABNORMAL HIGH (ref 70–99)

## 2023-10-07 SURGERY — LUMBAR LAMINECTOMY/DECOMPRESSION MICRODISCECTOMY 1 LEVEL
Anesthesia: General | Site: Spine Lumbar | Laterality: Bilateral

## 2023-10-07 MED ORDER — ACETAMINOPHEN 650 MG RE SUPP: 650.0000 mg | RECTAL | Status: DC | PRN

## 2023-10-07 MED ORDER — SALINE SPRAY 0.65 % NA SOLN
1.0000 | NASAL | Status: DC | PRN
  Filled 2023-10-07: qty 44

## 2023-10-07 MED ORDER — SODIUM CHLORIDE 0.9 % IV SOLN
250.0000 mL | INTRAVENOUS | Status: DC
  Administered 2023-10-07: 250 mL via INTRAVENOUS

## 2023-10-07 MED ORDER — IMATINIB MESYLATE 400 MG PO TABS: 400.0000 mg | ORAL_TABLET | Freq: Every day | ORAL | Status: DC

## 2023-10-07 MED ORDER — DAPAGLIFLOZIN PROPANEDIOL 5 MG PO TABS
5.0000 mg | ORAL_TABLET | Freq: Every day | ORAL | Status: DC
  Administered 2023-10-08: 5 mg via ORAL
  Filled 2023-10-07: qty 1

## 2023-10-07 MED ORDER — DIPHENHYDRAMINE HCL 25 MG PO CAPS
25.0000 mg | ORAL_CAPSULE | Freq: Four times a day (QID) | ORAL | Status: DC | PRN
  Administered 2023-10-07: 25 mg via ORAL
  Filled 2023-10-07: qty 1

## 2023-10-07 MED ORDER — CEFAZOLIN SODIUM-DEXTROSE 2-4 GM/100ML-% IV SOLN
2.0000 g | Freq: Three times a day (TID) | INTRAVENOUS | Status: AC
  Administered 2023-10-07 – 2023-10-08 (×2): 2 g via INTRAVENOUS
  Filled 2023-10-07 (×2): qty 100

## 2023-10-07 MED ORDER — ACETAMINOPHEN 500 MG PO TABS
ORAL_TABLET | ORAL | Status: AC
Start: 2023-10-07 — End: 2023-10-07
  Administered 2023-10-07: 1000 mg via ORAL
  Filled 2023-10-07: qty 2

## 2023-10-07 MED ORDER — MENTHOL 3 MG MT LOZG: 1.0000 | LOZENGE | OROMUCOSAL | Status: DC | PRN

## 2023-10-07 MED ORDER — FENTANYL CITRATE (PF) 250 MCG/5ML IJ SOLN
INTRAMUSCULAR | Status: AC
  Filled 2023-10-07: qty 5

## 2023-10-07 MED ORDER — PROPOFOL 10 MG/ML IV BOLUS
INTRAVENOUS | Status: AC
  Filled 2023-10-07: qty 20

## 2023-10-07 MED ORDER — HYDROMORPHONE HCL 1 MG/ML IJ SOLN
INTRAMUSCULAR | Status: DC | PRN
  Administered 2023-10-07: .5 mg via INTRAVENOUS

## 2023-10-07 MED ORDER — BUPIVACAINE HCL (PF) 0.5 % IJ SOLN
INTRAMUSCULAR | Status: AC
  Filled 2023-10-07: qty 30

## 2023-10-07 MED ORDER — BUPIVACAINE HCL (PF) 0.5 % IJ SOLN
INTRAMUSCULAR | Status: DC | PRN
  Administered 2023-10-07: 25 mL
  Administered 2023-10-07: 5 mL

## 2023-10-07 MED ORDER — LIDOCAINE-EPINEPHRINE 1 %-1:100000 IJ SOLN
INTRAMUSCULAR | Status: DC | PRN
  Administered 2023-10-07: 5 mL

## 2023-10-07 MED ORDER — METHOCARBAMOL 500 MG PO TABS
500.0000 mg | ORAL_TABLET | Freq: Four times a day (QID) | ORAL | Status: DC | PRN
  Administered 2023-10-07 – 2023-10-08 (×4): 500 mg via ORAL
  Filled 2023-10-07 (×4): qty 1

## 2023-10-07 MED ORDER — GLUCERNA SHAKE PO LIQD
237.0000 mL | Freq: Three times a day (TID) | ORAL | Status: DC
  Administered 2023-10-07 – 2023-10-08 (×4): 237 mL via ORAL
  Filled 2023-10-07 (×5): qty 237

## 2023-10-07 MED ORDER — CHLORHEXIDINE GLUCONATE CLOTH 2 % EX PADS: 6.0000 | MEDICATED_PAD | Freq: Once | CUTANEOUS | Status: DC

## 2023-10-07 MED ORDER — CHLORHEXIDINE GLUCONATE 0.12 % MT SOLN
15.0000 mL | Freq: Once | OROMUCOSAL | Status: AC
  Administered 2023-10-07: 15 mL via OROMUCOSAL
  Filled 2023-10-07: qty 15

## 2023-10-07 MED ORDER — ALUM & MAG HYDROXIDE-SIMETH 200-200-20 MG/5ML PO SUSP
30.0000 mL | Freq: Four times a day (QID) | ORAL | Status: DC | PRN
  Filled 2023-10-07: qty 30

## 2023-10-07 MED ORDER — OXYCODONE-ACETAMINOPHEN 5-325 MG PO TABS
1.0000 | ORAL_TABLET | ORAL | Status: DC | PRN
  Administered 2023-10-07 – 2023-10-08 (×6): 1 via ORAL
  Filled 2023-10-07 (×6): qty 1

## 2023-10-07 MED ORDER — BISACODYL 10 MG RE SUPP: 10.0000 mg | Freq: Every day | RECTAL | Status: DC | PRN

## 2023-10-07 MED ORDER — ACETAMINOPHEN 500 MG PO TABS: 1000.0000 mg | ORAL_TABLET | Freq: Once | ORAL | Status: DC

## 2023-10-07 MED ORDER — DOCUSATE SODIUM 100 MG PO CAPS
100.0000 mg | ORAL_CAPSULE | Freq: Two times a day (BID) | ORAL | Status: DC
Start: 2023-10-07 — End: 2023-10-08
  Filled 2023-10-07: qty 1

## 2023-10-07 MED ORDER — PHENYLEPHRINE 80 MCG/ML (10ML) SYRINGE FOR IV PUSH (FOR BLOOD PRESSURE SUPPORT)
PREFILLED_SYRINGE | INTRAVENOUS | Status: DC | PRN
  Administered 2023-10-07 (×3): 80 ug via INTRAVENOUS

## 2023-10-07 MED ORDER — HYDROMORPHONE HCL 1 MG/ML IJ SOLN
0.2500 mg | INTRAMUSCULAR | Status: DC | PRN
Start: 2023-10-07 — End: 2023-10-07

## 2023-10-07 MED ORDER — PHENOL 1.4 % MT LIQD: 1.0000 | OROMUCOSAL | Status: DC | PRN

## 2023-10-07 MED ORDER — LISINOPRIL 20 MG PO TABS
20.0000 mg | ORAL_TABLET | Freq: Every day | ORAL | Status: DC
  Administered 2023-10-07 – 2023-10-08 (×2): 20 mg via ORAL
  Filled 2023-10-07 (×2): qty 1

## 2023-10-07 MED ORDER — ACETAMINOPHEN 325 MG PO TABS: 650.0000 mg | ORAL_TABLET | ORAL | Status: DC | PRN

## 2023-10-07 MED ORDER — SODIUM CHLORIDE 0.9% FLUSH: 3.0000 mL | INTRAVENOUS | Status: DC | PRN

## 2023-10-07 MED ORDER — FLEET ENEMA RE ENEM: 1.0000 | ENEMA | Freq: Once | RECTAL | Status: DC | PRN

## 2023-10-07 MED ORDER — ONDANSETRON HCL 4 MG/2ML IJ SOLN
INTRAMUSCULAR | Status: DC | PRN
  Administered 2023-10-07: 4 mg via INTRAVENOUS

## 2023-10-07 MED ORDER — ORAL CARE MOUTH RINSE: 15.0000 mL | Freq: Once | OROMUCOSAL | Status: AC

## 2023-10-07 MED ORDER — MIDAZOLAM HCL 2 MG/2ML IJ SOLN
INTRAMUSCULAR | Status: AC
  Filled 2023-10-07: qty 2

## 2023-10-07 MED ORDER — ROCURONIUM BROMIDE 10 MG/ML (PF) SYRINGE
PREFILLED_SYRINGE | INTRAVENOUS | Status: AC
  Filled 2023-10-07: qty 10

## 2023-10-07 MED ORDER — ONDANSETRON HCL 4 MG PO TABS: 4.0000 mg | ORAL_TABLET | Freq: Four times a day (QID) | ORAL | Status: DC | PRN

## 2023-10-07 MED ORDER — ONDANSETRON HCL 4 MG/2ML IJ SOLN: 4.0000 mg | Freq: Four times a day (QID) | INTRAMUSCULAR | Status: DC | PRN

## 2023-10-07 MED ORDER — 0.9 % SODIUM CHLORIDE (POUR BTL) OPTIME
TOPICAL | Status: DC | PRN
  Administered 2023-10-07: 1000 mL

## 2023-10-07 MED ORDER — MUPIROCIN 2 % EX OINT
1.0000 | TOPICAL_OINTMENT | Freq: Two times a day (BID) | CUTANEOUS | 0 refills | Status: AC
  Filled 2023-10-07: qty 66, 33d supply, fill #0

## 2023-10-07 MED ORDER — ONDANSETRON HCL 4 MG/2ML IJ SOLN
INTRAMUSCULAR | Status: AC
  Filled 2023-10-07: qty 2

## 2023-10-07 MED ORDER — INSULIN PEN NEEDLE 32G X 4 MM MISC: 32.0000 g | Freq: Every day | Status: DC

## 2023-10-07 MED ORDER — DIPHENHYDRAMINE HCL 25 MG PO CAPS
25.0000 mg | ORAL_CAPSULE | ORAL | Status: DC | PRN
  Administered 2023-10-07 – 2023-10-08 (×3): 25 mg via ORAL
  Filled 2023-10-07 (×3): qty 1

## 2023-10-07 MED ORDER — FENTANYL CITRATE (PF) 250 MCG/5ML IJ SOLN
INTRAMUSCULAR | Status: DC | PRN
  Administered 2023-10-07 (×2): 50 ug via INTRAVENOUS
  Administered 2023-10-07 (×2): 25 ug via INTRAVENOUS
  Administered 2023-10-07: 100 ug via INTRAVENOUS

## 2023-10-07 MED ORDER — LIDOCAINE-EPINEPHRINE 1 %-1:100000 IJ SOLN
INTRAMUSCULAR | Status: AC
  Filled 2023-10-07: qty 1

## 2023-10-07 MED ORDER — DEXAMETHASONE SODIUM PHOSPHATE 10 MG/ML IJ SOLN
INTRAMUSCULAR | Status: AC
  Filled 2023-10-07: qty 1

## 2023-10-07 MED ORDER — ACETAMINOPHEN 500 MG PO TABS: 1000.0000 mg | ORAL_TABLET | Freq: Once | ORAL | Status: AC

## 2023-10-07 MED ORDER — LACTATED RINGERS IV SOLN: INTRAVENOUS | Status: DC

## 2023-10-07 MED ORDER — CHLORHEXIDINE GLUCONATE 4 % EX SOLN
1.0000 | CUTANEOUS | 1 refills | Status: DC
Start: 2023-10-07 — End: 2023-11-19
  Filled 2023-10-07: qty 946, 30d supply, fill #0

## 2023-10-07 MED ORDER — SUGAMMADEX SODIUM 200 MG/2ML IV SOLN
INTRAVENOUS | Status: DC | PRN
  Administered 2023-10-07: 200 mg via INTRAVENOUS

## 2023-10-07 MED ORDER — INSULIN ASPART 100 UNIT/ML IJ SOLN
0.0000 [IU] | Freq: Three times a day (TID) | INTRAMUSCULAR | Status: DC
Start: 2023-10-07 — End: 2023-10-08
  Administered 2023-10-07: 11 [IU] via SUBCUTANEOUS
  Administered 2023-10-08: 3 [IU] via SUBCUTANEOUS
  Administered 2023-10-08: 4 [IU] via SUBCUTANEOUS

## 2023-10-07 MED ORDER — HYDROMORPHONE HCL 1 MG/ML IJ SOLN: 0.5000 mg | INTRAMUSCULAR | Status: DC | PRN

## 2023-10-07 MED ORDER — THROMBIN 5000 UNITS EX SOLR: OROMUCOSAL | Status: DC | PRN

## 2023-10-07 MED ORDER — CEFAZOLIN SODIUM-DEXTROSE 2-4 GM/100ML-% IV SOLN
2.0000 g | INTRAVENOUS | Status: AC
  Administered 2023-10-07: 2 g via INTRAVENOUS
  Filled 2023-10-07: qty 100

## 2023-10-07 MED ORDER — METHOCARBAMOL 1000 MG/10ML IJ SOLN: 500.0000 mg | Freq: Four times a day (QID) | INTRAMUSCULAR | Status: DC | PRN

## 2023-10-07 MED ORDER — SODIUM CHLORIDE 0.9% FLUSH
3.0000 mL | Freq: Two times a day (BID) | INTRAVENOUS | Status: DC
  Administered 2023-10-07 (×2): 3 mL via INTRAVENOUS

## 2023-10-07 MED ORDER — PROPOFOL 10 MG/ML IV BOLUS
INTRAVENOUS | Status: DC | PRN
Start: 2023-10-07 — End: 2023-10-07
  Administered 2023-10-07: 170 mg via INTRAVENOUS

## 2023-10-07 MED ORDER — PREGABALIN 100 MG PO CAPS
100.0000 mg | ORAL_CAPSULE | Freq: Two times a day (BID) | ORAL | Status: DC
Start: 2023-10-07 — End: 2023-10-08
  Administered 2023-10-07 – 2023-10-08 (×3): 100 mg via ORAL
  Filled 2023-10-07 (×3): qty 1

## 2023-10-07 MED ORDER — POLYETHYLENE GLYCOL 3350 17 G PO PACK: 17.0000 g | PACK | Freq: Every day | ORAL | Status: DC | PRN

## 2023-10-07 MED ORDER — HYDROMORPHONE HCL 1 MG/ML IJ SOLN
INTRAMUSCULAR | Status: AC
Start: 2023-10-07 — End: ?
  Filled 2023-10-07: qty 0.5

## 2023-10-07 MED ORDER — PHENYLEPHRINE 80 MCG/ML (10ML) SYRINGE FOR IV PUSH (FOR BLOOD PRESSURE SUPPORT)
PREFILLED_SYRINGE | INTRAVENOUS | Status: AC
  Filled 2023-10-07: qty 10

## 2023-10-07 MED ORDER — PHENYLEPHRINE HCL-NACL 20-0.9 MG/250ML-% IV SOLN
INTRAVENOUS | Status: DC | PRN
  Administered 2023-10-07: 30 ug/min via INTRAVENOUS

## 2023-10-07 MED ORDER — SENNA 8.6 MG PO TABS
1.0000 | ORAL_TABLET | Freq: Two times a day (BID) | ORAL | Status: DC
  Filled 2023-10-07: qty 1

## 2023-10-07 MED ORDER — MIDAZOLAM HCL 2 MG/2ML IJ SOLN
INTRAMUSCULAR | Status: DC | PRN
  Administered 2023-10-07: 2 mg via INTRAVENOUS

## 2023-10-07 MED ORDER — DEXAMETHASONE SODIUM PHOSPHATE 10 MG/ML IJ SOLN
INTRAMUSCULAR | Status: DC | PRN
  Administered 2023-10-07: 10 mg via INTRAVENOUS

## 2023-10-07 MED ORDER — LIDOCAINE 2% (20 MG/ML) 5 ML SYRINGE
INTRAMUSCULAR | Status: DC | PRN
  Administered 2023-10-07: 40 mg via INTRAVENOUS

## 2023-10-07 MED ORDER — ROCURONIUM BROMIDE 10 MG/ML (PF) SYRINGE
PREFILLED_SYRINGE | INTRAVENOUS | Status: DC | PRN
Start: 2023-10-07 — End: 2023-10-07
  Administered 2023-10-07: 5 mg via INTRAVENOUS
  Administered 2023-10-07: 70 mg via INTRAVENOUS

## 2023-10-07 MED ORDER — LIDOCAINE 2% (20 MG/ML) 5 ML SYRINGE
INTRAMUSCULAR | Status: AC
  Filled 2023-10-07: qty 5

## 2023-10-07 MED ORDER — THROMBIN 5000 UNITS EX SOLR
CUTANEOUS | Status: AC
  Filled 2023-10-07: qty 5000

## 2023-10-07 MED ORDER — DEXMEDETOMIDINE HCL IN NACL 80 MCG/20ML IV SOLN
INTRAVENOUS | Status: DC | PRN
  Administered 2023-10-07: 8 ug via INTRAVENOUS
  Administered 2023-10-07 (×3): 4 ug via INTRAVENOUS

## 2023-10-07 SURGICAL SUPPLY — 46 items
BAG COUNTER SPONGE SURGICOUNT (BAG) ×1 IMPLANT
BAND RUBBER #18 3X1/16 STRL (MISCELLANEOUS) ×2 IMPLANT
BIT DRILL NEURO 2X3.1 SFT TUCH (MISCELLANEOUS) IMPLANT
BLADE CLIPPER SURG (BLADE) IMPLANT
BUR ACORN 6.0 (BURR) IMPLANT
BUR MATCHSTICK NEURO 3.0 LAGG (BURR) ×1 IMPLANT
CANISTER SUCT 3000ML PPV (MISCELLANEOUS) ×1 IMPLANT
DERMABOND ADVANCED .7 DNX12 (GAUZE/BANDAGES/DRESSINGS) ×1 IMPLANT
DEVICE DISSECT PLASMABLAD 3.0S (MISCELLANEOUS) ×1 IMPLANT
DRAPE HALF SHEET 40X57 (DRAPES) IMPLANT
DRAPE LAPAROTOMY T 102X78X121 (DRAPES) ×1 IMPLANT
DRAPE MICROSCOPE SLANT 54X150 (MISCELLANEOUS) IMPLANT
DRILL NEURO 2X3.1 SOFT TOUCH (MISCELLANEOUS) ×1
DRSG OPSITE POSTOP 4X6 (GAUZE/BANDAGES/DRESSINGS) IMPLANT
DURAPREP 26ML APPLICATOR (WOUND CARE) ×1 IMPLANT
ELECT REM PT RETURN 9FT ADLT (ELECTROSURGICAL) ×1
ELECTRODE REM PT RTRN 9FT ADLT (ELECTROSURGICAL) ×1 IMPLANT
GAUZE 4X4 16PLY ~~LOC~~+RFID DBL (SPONGE) IMPLANT
GAUZE SPONGE 4X4 12PLY STRL (GAUZE/BANDAGES/DRESSINGS) ×1 IMPLANT
GLOVE BIOGEL PI IND STRL 8.5 (GLOVE) ×1 IMPLANT
GLOVE ECLIPSE 8.5 STRL (GLOVE) ×1 IMPLANT
GOWN STRL REUS W/ TWL LRG LVL3 (GOWN DISPOSABLE) IMPLANT
GOWN STRL REUS W/ TWL XL LVL3 (GOWN DISPOSABLE) IMPLANT
GOWN STRL REUS W/TWL 2XL LVL3 (GOWN DISPOSABLE) ×1 IMPLANT
HEMOSTAT POWDER KIT SURGIFOAM (HEMOSTASIS) ×1 IMPLANT
KIT BASIN OR (CUSTOM PROCEDURE TRAY) ×1 IMPLANT
KIT TURNOVER KIT B (KITS) ×1 IMPLANT
NDL HYPO 22X1.5 SAFETY MO (MISCELLANEOUS) ×1 IMPLANT
NDL SPNL 20GX3.5 QUINCKE YW (NEEDLE) IMPLANT
NEEDLE HYPO 22X1.5 SAFETY MO (MISCELLANEOUS) ×1
NEEDLE SPNL 20GX3.5 QUINCKE YW (NEEDLE)
NS IRRIG 1000ML POUR BTL (IV SOLUTION) ×1 IMPLANT
PACK LAMINECTOMY NEURO (CUSTOM PROCEDURE TRAY) ×1 IMPLANT
PAD ARMBOARD 7.5X6 YLW CONV (MISCELLANEOUS) ×3 IMPLANT
PATTIES SURGICAL .5 X1 (DISPOSABLE) ×1 IMPLANT
PLASMABLADE 3.0S (MISCELLANEOUS) ×1
SPIKE FLUID TRANSFER (MISCELLANEOUS) ×1 IMPLANT
SPONGE SURGIFOAM ABS GEL SZ50 (HEMOSTASIS) IMPLANT
SUT VIC AB 1 CT1 18XBRD ANBCTR (SUTURE) ×1 IMPLANT
SUT VIC AB 2-0 CP2 18 (SUTURE) ×1 IMPLANT
SUT VIC AB 3-0 SH 8-18 (SUTURE) ×1 IMPLANT
SUT VIC AB 4-0 RB1 18 (SUTURE) ×1 IMPLANT
TOWEL GREEN STERILE (TOWEL DISPOSABLE) ×1 IMPLANT
TOWEL GREEN STERILE FF (TOWEL DISPOSABLE) ×1 IMPLANT
TUBING FEATHERFLOW (TUBING) ×1 IMPLANT
WATER STERILE IRR 1000ML POUR (IV SOLUTION) ×1 IMPLANT

## 2023-10-07 NOTE — Anesthesia Preprocedure Evaluation (Addendum)
 Anesthesia Evaluation  Patient identified by MRN, date of birth, ID band Patient awake    Reviewed: Allergy & Precautions, H&P , NPO status , Patient's Chart, lab work & pertinent test results  Airway Mallampati: II  TM Distance: >3 FB Neck ROM: Full    Dental no notable dental hx. (+) Teeth Intact, Dental Advisory Given   Pulmonary former smoker   Pulmonary exam normal breath sounds clear to auscultation       Cardiovascular hypertension,  Rhythm:Regular Rate:Normal     Neuro/Psych  Headaches  Anxiety Depression       GI/Hepatic negative GI ROS,,,(+)     substance abuse  alcohol use  Endo/Other  diabetes, Insulin  Dependent    Renal/GU negative Renal ROS  negative genitourinary   Musculoskeletal  (+) Arthritis , Osteoarthritis,    Abdominal   Peds  Hematology negative hematology ROS (+)   Anesthesia Other Findings   Reproductive/Obstetrics negative OB ROS                             Anesthesia Physical Anesthesia Plan  ASA: 3  Anesthesia Plan: General   Post-op Pain Management: Tylenol  PO (pre-op)*   Induction: Intravenous  PONV Risk Score and Plan: 3 and Ondansetron , Dexamethasone  and Midazolam   Airway Management Planned: Oral ETT  Additional Equipment:   Intra-op Plan:   Post-operative Plan: Extubation in OR  Informed Consent: I have reviewed the patients History and Physical, chart, labs and discussed the procedure including the risks, benefits and alternatives for the proposed anesthesia with the patient or authorized representative who has indicated his/her understanding and acceptance.     Dental advisory given  Plan Discussed with: CRNA  Anesthesia Plan Comments:        Anesthesia Quick Evaluation

## 2023-10-07 NOTE — H&P (Signed)
 Gerald Olson is an 58 y.o. male.   Chief Complaint: Bilateral leg pain history of fusion L5-S1 HPI: Gerald Olson is a 58 year old individual whose had a previous decompression and fusion at L5-S1 secondary to a degenerative spondylolisthesis.  He has done well for a few years but here recently has developed severe bilateral leg pain to the point where he is been seen in the emergency department and has had increasing pain and weakness in his lower extremities.  An MRI was recently performed that demonstrates development of bilateral synovial cysts at L4-L5 above his fusion is larger on the right than on the left and is creating substantial compromise for the exiting L5 nerve roots inferiorly.  After careful consideration of his options I advised bilateral laminectomy to decompress the L4-L5 region.  There is no evidence of listhesis and I believe that simple decompression should yield adequate relief of the worst radicular pain that he is experiencing now.  Past Medical History:  Diagnosis Date   ADHD (attention deficit hyperactivity disorder)    Diagnosed during teenage years   Alcohol use disorder    On average 14-21 drinks per week   Alcoholic cirrhosis of liver    Per patient - condition is not related to moderate alcohol use but rather prolonged NSAID use and recent abuse.   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[place]   Major depressive disorder    Other spondylosis with radiculopathy, cervical region    Retinopathy    Right eye; per patient report   Type 2 diabetes mellitus with  hyperglycemia     Past Surgical History:  Procedure Laterality Date   CHOLECYSTECTOMY N/A 06/02/2018   Procedure: LAPAROSCOPIC CHOLECYSTECTOMY;  Surgeon: Franki Isles, MD;  Location: [facility] ORS;  Service: General;  Laterality: N/A;   KNEE ARTHROSCOPY Right    30 Years ago   lumbar five-sacral one posterior lumbar interbody fusion  01/24/2021    Family History  Problem Relation Age of Onset   Aneurysm Father    Drug abuse Sister    Multiple sclerosis Sister    Suicidality Maternal Aunt    Emphysema Maternal Grandfather    Diabetes Neg Hx    Social History:  reports that he quit smoking about 20 years ago. His smoking use included cigarettes. He has never used smokeless tobacco. He reports current alcohol use of about 21.0 - 42.0 standard drinks of alcohol per week. He reports current drug use. Drug: Marijuana.  Allergies: No Known Allergies  Medications Prior to Admission  Medication Sig Dispense Refill   cyclobenzaprine  (FLEXERIL ) 5 MG tablet Take 1 tablet (5 mg total) by mouth 3 (three) times daily as needed for muscle spasms. 20 tablet 0   dapagliflozin  propanediol (FARXIGA ) 5 MG TABS tablet Take 1 tablet (5 mg total) by mouth daily before breakfast. 90 tablet 3   ibuprofen  (ADVIL ) 200 MG tablet Take 800 mg by mouth every 6 (six) hours as needed for moderate pain (pain score 4-6).  insulin  glargine (LANTUS  SOLOSTAR) 100 UNIT/ML Solostar Pen Inject 7 Units into the skin every morning. 15 mL 3   sodium chloride  (OCEAN) 0.65 % SOLN nasal spray Place 1 spray into both nostrils as needed for congestion.     Blood Pressure Monitoring (BLOOD PRESSURE MONITOR AUTOMAT) DEVI Check blood pressure in AM and PM daily 1 each 0   glucose blood (ACCU-CHEK GUIDE) test strip USE AS DIRECTED 2 TIMES DAILY 180 strip 0   imatinib  (GLEEVEC ) 400 MG tablet Take 1 tablet (400 mg total) by mouth daily. (Patient not taking: Reported on 09/27/2023) 30 tablet 5   Insulin  Pen Needle (TECHLITE PEN  NEEDLES) 32G X 4 MM MISC Use to inject Victoza  once a day 100 each 11   lisinopril  (ZESTRIL ) 20 MG tablet Take 1 tablet (20 mg total) by mouth daily. (Patient not taking: Reported on 09/27/2023) 90 tablet 3   oxyCODONE -acetaminophen  (PERCOCET) 10-325 MG tablet Take 0.5 tablets by mouth as needed for pain.     pregabalin  (LYRICA ) 100 MG capsule Take 1 capsule (100 mg total) by mouth 2 (two) times daily. (Patient not taking: Reported on 09/27/2023) 60 capsule 6    No results found for this or any previous visit (from the past 48 hours). No results found.  Review of Systems  Constitutional:  Positive for activity change.  Musculoskeletal:  Positive for back pain, gait problem and myalgias.  Neurological:  Positive for weakness and numbness.  All other systems reviewed and are negative.   There were no vitals taken for this visit. Physical Exam Constitutional:      Appearance: Normal appearance. He is normal weight.  HENT:     Head: Normocephalic and atraumatic.     Right Ear: Tympanic membrane, ear canal and external ear normal.     Left Ear: Tympanic membrane, ear canal and external ear normal.     Nose: Nose normal.     Mouth/Throat:     Mouth: Mucous membranes are moist.     Pharynx: Oropharynx is clear.  Eyes:     Extraocular Movements: Extraocular movements intact.     Conjunctiva/sclera: Conjunctivae normal.     Pupils: Pupils are equal, round, and reactive to light.  Cardiovascular:     Rate and Rhythm: Normal rate and regular rhythm.     Pulses: Normal pulses.     Heart sounds: Normal heart sounds.  Pulmonary:     Effort: Pulmonary effort is normal.     Breath sounds: Normal breath sounds.  Abdominal:     General: Abdomen is flat. Bowel sounds are normal.     Palpations: Abdomen is soft.  Musculoskeletal:        General: Normal range of motion.     Cervical back: Normal range of motion and neck supple.  Skin:    General: Skin is warm and dry.     Capillary Refill:  Capillary refill takes less than 2 seconds.  Neurological:     Mental Status: He is alert.     Comments: Mild weakness in tibialis anterior bilaterally 4 - out of 5.  Sensory exam is intact in the dorsum of the feet and in the calves.  Cranial nerve examination is normal upper extremity strength and reflexes are normal.  Absent patellar and Achilles reflexes.  Psychiatric:        Mood and Affect: Mood normal.        Behavior: Behavior normal.        Thought Content: Thought content  normal.        Judgment: Judgment normal.      Assessment/Plan Spondylosis with bilateral synovial cyst L4-L5 history of fusion L5-S1.  Plan: Bilateral laminectomy decompression of L4-L5.  Sela Daft, MD 10/07/2023, 7:56 AM

## 2023-10-07 NOTE — Transfer of Care (Signed)
 Immediate Anesthesia Transfer of Care Note  Patient: Gerald Olson  Procedure(s) Performed: Laminectomy for Facet/Synovial Cyst - Lumbar Four-Lumbar Five - Bilateral (Bilateral: Spine Lumbar)  Patient Location: PACU  Anesthesia Type:General  Level of Consciousness: awake, alert , and patient cooperative  Airway & Oxygen Therapy: Patient Spontanous Breathing and Patient connected to face mask oxygen  Post-op Assessment: Report given to RN and Post -op Vital signs reviewed and stable  Post vital signs: Reviewed and stable  Last Vitals:  Vitals Value Taken Time  BP 130/78 10/07/23 1227  Temp    Pulse 96 10/07/23 1230  Resp 29 10/07/23 1230  SpO2 94 % 10/07/23 1230  Vitals shown include unfiled device data.  Last Pain:  Vitals:   10/07/23 0856  TempSrc:   PainSc: 9       Patients Stated Pain Goal: 3 (10/07/23 0856)  Complications: No notable events documented.

## 2023-10-07 NOTE — Anesthesia Postprocedure Evaluation (Signed)
 Anesthesia Post Note  Patient: DACOTAH GOAD  Procedure(s) Performed: Laminectomy for Facet/Synovial Cyst - Lumbar Four-Lumbar Five - Bilateral (Bilateral: Spine Lumbar)     Patient location during evaluation: PACU Anesthesia Type: General Level of consciousness: sedated, patient cooperative and oriented Pain management: pain level controlled Vital Signs Assessment: post-procedure vital signs reviewed and stable Respiratory status: spontaneous breathing, nonlabored ventilation and respiratory function stable Cardiovascular status: blood pressure returned to baseline and stable Postop Assessment: no apparent nausea or vomiting Anesthetic complications: no   No notable events documented.  Last Vitals:  Vitals:   10/07/23 1300 10/07/23 1322  BP: 130/83 (!) 141/86  Pulse: 86 87  Resp: 17 20  Temp: 36.8 C 36.6 C  SpO2: 94% 97%    Last Pain:  Vitals:   10/07/23 1230  TempSrc:   PainSc: Asleep                 Kaylamarie Swickard,E. Ghina Bittinger

## 2023-10-07 NOTE — Plan of Care (Signed)

## 2023-10-07 NOTE — Op Note (Signed)
 Date of surgery: 10/07/2023 Preoperative diagnosis: Spondylosis with stenosis and synovial cyst formation bilaterally L4-L5.  History of fusion L5-S1. Postoperative diagnosis: Same Procedure: Bilateral laminotomies and decompression of common dural tube and the L4 and L5 nerve roots from synovial cysts at L4-L5. Surgeon: Elna Haggis Assistant: Pleasant Brilliant, MD Anesthesia: General Tracheal Indications: Gerald Olson is a 58 year old individuals had a previous spondylolisthesis at L5-S1.  He underwent a fusion several years ago and did well however he has developed significant back pain bilateral lower extremity pain worse on the right than on the left.  He has evidence of substantial synovial cyst formation with facet hypertrophy at the L4-L5 level.  He has no evidence of any abnormal motion.  Has been advised regarding surgical decompression via laminotomies and foraminotomies to resect the synovial cyst and decompress the L4 and L5 nerve roots.  Procedure the patient was brought to the operating room supine on the stretcher.  After the smooth induction of general endotracheal anesthesia, he was carefully turned prone.  The back was prepped with alcohol DuraPrep and draped in a sterile fashion.  Midline incision was created partially involving his previous incision for his fusion.  This was dissected down to the lumbodorsal fascia.  Localizing radiograph identified the L4-L5 space positively.  Then a subperiosteal dissection in the interlaminar space at L4-L5 was obtained first on the right side then on the left side.  The interlaminar space at L4-L5 was exposed.  Laminectomy was performed to remove the inferior margin lamina of L4 out to the medial wall of facet.  A partial medial facetectomy was performed using a high-speed drill then once we identified the dura Dr. Larrie Po provided retraction of the dura while I worked in the interlaminar space to expose and undermine a large synovial cyst that was densely  attached to the dura.  This dissection was done in a piecemeal fashion very cautiously so as not to root render any dural incursion's.  As we gradually decompressed the cyst were able to remove it in a piecemeal fashion to allow further exposure of the deeper 4 portions a substantial portion of the cyst was adherent to the superior articular process of L5 and this required resection of that superior articular process.  Ultimately were able to remove and decompress the common dural tube and the path of the L4 nerve root superiorly and the path of the L5 nerve root inferiorly.  Once we are completed on the right side we did a similar dissection on the left side where the cystic structure was not nearly as large.  It was however even more adherent to the dura and required careful dissection with the help of Dr. Larrie Po were able to resect the entirety of the cyst on both sides and relieve pressure on the L4 and L5 nerve roots.  Once this was completed the area was irrigated and checked for hemostasis when this was verified the retractors were removed 20 cc of half percent Marcaine  was injected into the paraspinous fascia then the lumbodorsal fascia was closed with #1 Vicryl in interrupted fashion 2-0 Vicryl in the subcutaneous tissues 3-0 Vicryl and 4-0 Vicryl subcuticularly.  Dermabond was placed on the skin.  Blood loss was estimated at 75 cc.

## 2023-10-07 NOTE — Anesthesia Procedure Notes (Signed)
 Procedure Name: Intubation Date/Time: 10/07/2023 10:25 AM  Performed by: Candance Certain, CRNAPre-anesthesia Checklist: Patient identified, Emergency Drugs available, Suction available and Patient being monitored Patient Re-evaluated:Patient Re-evaluated prior to induction Oxygen Delivery Method: Circle System Utilized Preoxygenation: Pre-oxygenation with 100% oxygen Induction Type: IV induction Ventilation: Oral airway inserted - appropriate to patient size Laryngoscope Size: Mac and 4 Grade View: Grade II Tube type: Oral Tube size: 7.5 mm Number of attempts: 1 Airway Equipment and Method: Stylet and Oral airway Placement Confirmation: ETT inserted through vocal cords under direct vision, positive ETCO2 and breath sounds checked- equal and bilateral Secured at: 23 cm Tube secured with: Tape Dental Injury: Teeth and Oropharynx as per pre-operative assessment  Comments: Atraumatic induction/intubation. Dentition and oral mucosa as per preop.

## 2023-10-08 ENCOUNTER — Encounter (HOSPITAL_COMMUNITY): Payer: Self-pay | Admitting: Neurological Surgery

## 2023-10-08 ENCOUNTER — Other Ambulatory Visit: Payer: Self-pay

## 2023-10-08 ENCOUNTER — Other Ambulatory Visit: Payer: Self-pay | Admitting: Physician Assistant

## 2023-10-08 ENCOUNTER — Other Ambulatory Visit (HOSPITAL_COMMUNITY): Payer: Self-pay

## 2023-10-08 DIAGNOSIS — M48061 Spinal stenosis, lumbar region without neurogenic claudication: Secondary | ICD-10-CM | POA: Diagnosis not present

## 2023-10-08 DIAGNOSIS — I152 Hypertension secondary to endocrine disorders: Secondary | ICD-10-CM

## 2023-10-08 LAB — GLUCOSE, CAPILLARY
Glucose-Capillary: 147 mg/dL — ABNORMAL HIGH (ref 70–99)
Glucose-Capillary: 162 mg/dL — ABNORMAL HIGH (ref 70–99)

## 2023-10-08 MED ORDER — OXYCODONE-ACETAMINOPHEN 5-325 MG PO TABS
1.0000 | ORAL_TABLET | Freq: Four times a day (QID) | ORAL | 0 refills | Status: DC | PRN
  Filled 2023-10-08: qty 40, 5d supply, fill #0

## 2023-10-08 MED ORDER — METHOCARBAMOL 500 MG PO TABS
500.0000 mg | ORAL_TABLET | Freq: Four times a day (QID) | ORAL | 2 refills | Status: DC | PRN
  Filled 2023-10-08: qty 30, 8d supply, fill #0

## 2023-10-08 NOTE — Evaluation (Signed)
Occupational Therapy Evaluation Patient Details Name: Gerald Olson MRN: [id number] DOB: 21-Apr-1966 Today's Date: 10/08/2023   History of Present Illness   History of Present Illness: Pt is a 58 y.o. male s/p bil L4-5  Laminectomy for Facet/Synovial Cyst. PMH significant for alcohol use disorder, leukemia, diffuse idiopathic skeletal hyperostosis, glaucoma, doabetic polyneuropathy, DMII.     Clinical Impressions PTA, pt lived alone independently. Upon eval, pt performing UB ADL with mod I and LB ADL with supervision and cues for optimal technique during new learning. Pt educated and demonstrating use of compensatory techniques for bed mobility, LB ADL, grooming, toileting, and shower transfers within precautions. Intermittent cues for safety and to avoid stretches to back that are not within precautions throughout session. All education provided and questions answered, however, will follow acutely to optimize adherence to precautions. Recommending discharge home with no OT follow up at this time and mom/neighbor to assist as needed.          If plan is discharge home, recommend the following:   Other (comment) (on pt request.)     Functional Status Assessment   Patient has had a recent decline in their functional status and demonstrates the ability to make significant improvements in function in a reasonable and predictable amount of time.     Equipment Recommendations   None recommended by OT     Recommendations for Other Services   PT consult (cont education regarding safety with mobility and precautions)     Precautions/Restrictions   Precautions Precautions: Back Precaution Booklet Issued: Yes (comment) Recall of Precautions/Restrictions: Intact (recall intact, however, functional implementation more difficult with up to mod cues intermittently to redirect pt from stretching in ways that are not within precautions.) Precaution/Restrictions Comments: All precautions  reviewed within the context of ADL Required Braces or Orthoses:  (no brace needed order) Restrictions Weight Bearing Restrictions Per Provider Order: No     Mobility Bed Mobility Overal bed mobility: Needs Assistance Bed Mobility: Rolling, Sidelying to Sit Rolling: Contact guard assist Sidelying to sit: Contact guard assist       General bed mobility comments: cues for optimal technique. in awkward position with LE off EOB and lying down supine on arrival.    Transfers Overall transfer level: Needs assistance Equipment used: None Transfers: Sit to/from Stand Sit to Stand: Supervision           General transfer comment: for safety, slow to rise. Cues for posture      Balance Overall balance assessment: Mild deficits observed, not formally tested                                         ADL either performed or assessed with clinical judgement   ADL Overall ADL's : Needs assistance/impaired Eating/Feeding: Independent;Sitting   Grooming: Supervision/safety;Standing   Upper Body Bathing: Sitting;Modified independent   Lower Body Bathing: Set up;Supervison/ safety;Sit to/from stand   Upper Body Dressing : Independent;Sitting   Lower Body Dressing: Supervision/safety;Sit to/from stand;Set up Lower Body Dressing Details (indicate cue type and reason): cues for optimal technique; able to achieve figure 4 with increased time Toilet Transfer: Supervision/safety;Ambulation     Toileting - Clothing Manipulation Details (indicate cue type and reason): reviewed compensatory techniques Tub/ Shower Transfer: Contact guard assist;Ambulation;Tub Metallurgist Details (indicate cue type and reason): CGA for safety with stepping over tub. Functional mobility during ADLs: Supervision/safety General ADL  Comments: pt with frequent urge to stretch at back needing frequent cues to stay within precautions during this time; thus recommended PT eval to  optimize carryover of precautions into mobility and free time. Pt constantly attempting to stretch stating "welll if I do it this way I am not bending or if I do it this way I am not twisting"     Vision Baseline Vision/History: 0 No visual deficits Ability to See in Adequate Light: 0 Adequate Patient Visual Report: No change from baseline Additional Comments: has glasses present in room     Perception Perception: Not tested       Praxis Praxis: Not tested       Pertinent Vitals/Pain Pain Assessment Pain Assessment: Faces Faces Pain Scale: Hurts a little bit Pain Location: back Pain Descriptors / Indicators: Sore, Discomfort Pain Intervention(s): Limited activity within patient's tolerance, Monitored during session, Premedicated before session     Extremity/Trunk Assessment Upper Extremity Assessment Upper Extremity Assessment: RUE deficits/detail;LUE deficits/detail RUE Deficits / Details: decreased sensation along ulnar nerve distribution LUE Deficits / Details: decresaed sensation and pain along radial nerve distribution.  Pt reports history of cervical surgery   Lower Extremity Assessment Lower Extremity Assessment: Defer to PT evaluation   Cervical / Trunk Assessment Cervical / Trunk Assessment: Back Surgery   Communication Communication Communication: No apparent difficulties   Cognition Arousal: Alert Behavior During Therapy: WFL for tasks assessed/performed Cognition: No apparent impairments             OT - Cognition Comments: cues for functional implementation of precautions (i.e. avoiding stretching with twists/lateral flexion of trunk). believe cognition likely baseline                 Following commands: Impaired Following commands impaired: Only follows one step commands consistently, Follows one step commands with increased time (redirection to avoid breaking precautions despite being able to follow commands.)     Cueing  General  Comments   Cueing Techniques: Verbal cues;Gestural cues      Exercises     Shoulder Instructions      Home Living Family/patient expects to be discharged to:: Private residence Living Arrangements: Alone Available Help at Discharge: Family;Neighbor Type of Home: House Home Access: Stairs to enter [employer] of Steps: 2 Entrance Stairs-Rails:  (has bil rails in back, no rails in front) Home Layout: One level     Bathroom Shower/Tub: Chief Strategy Officer: Standard     Home Equipment: Cane - single point;Adaptive equipment Adaptive Equipment: Reacher (although reacher currently on floor behind couch)        Prior Functioning/Environment Prior Level of Function : Independent/Modified Independent             Mobility Comments: no AD with pt reportng that spaces are too narrow in house to use them ADLs Comments: independent in ADL and IADL    OT Problem List: Decreased strength;Decreased activity tolerance;Impaired balance (sitting and/or standing);Decreased knowledge of use of DME or AE;Decreased knowledge of precautions;Pain   OT Treatment/Interventions: Self-care/ADL training;Therapeutic exercise;DME and/or AE instruction;Balance training;Patient/family education;Therapeutic activities      OT Goals(Current goals can be found in the care plan section)   Acute Rehab OT Goals Patient Stated Goal: go home OT Goal Formulation: With patient Time For Goal Achievement: 10/22/23 Potential to Achieve Goals: Good   OT Frequency:  Min 1X/week    Co-evaluation              AM-PAC OT "6  Clicks" Daily Activity     Outcome Measure Help from another person eating meals?: None Help from another person taking care of personal grooming?: A Little Help from another person toileting, which includes using toliet, bedpan, or urinal?: A Little Help from another person bathing (including washing, rinsing, drying)?: A Little Help from another person  to put on and taking off regular upper body clothing?: None Help from another person to put on and taking off regular lower body clothing?: A Little 6 Click Score: 20   End of Session Equipment Utilized During Treatment: Gait belt Nurse Communication: Mobility status  Activity Tolerance: Patient tolerated treatment well Patient left: in bed;with call bell/phone within reach  OT Visit Diagnosis: Unsteadiness on feet (R26.81);Muscle weakness (generalized) (M62.81);Pain Pain - part of body:  (back)                Time: 1610-9604 OT Time Calculation (min): 26 min Charges:  OT General Charges $OT Visit: 1 Visit OT Evaluation $OT Eval Low Complexity: 1 Low OT Treatments $Self Care/Home Management : 8-22 mins  Tyler Deis, OTR/L [facility] Acute Rehabilitation Office: [phone]   Myrla Halsted 10/08/2023, 9:55 AM

## 2023-10-08 NOTE — Evaluation (Signed)
Physical Therapy Evaluation Patient Details Name: Gerald Olson MRN: [id number] DOB: 1965-10-07 Today's Date: 10/08/2023  History of Present Illness  Pt is a 58 y.o. male s/p bil L4-5  Laminectomy for Facet/Synovial Cyst. PMH significant for alcohol use disorder, leukemia, diffuse idiopathic skeletal hyperostosis, glaucoma, doabetic polyneuropathy, DMII.  Clinical Impression  Patient evaluated by Physical Therapy with no further acute PT needs identified. All education has been completed and the patient has no further questions. Pt familiar with back precautions from last surgery but pt is quite quick moving and needed education on keeping precautions functionally. He also has several limitations in his home including have a wood burning stove for heat. He reports that he has plenty of chopped wood for now and has neighbors to help. Reviewed activities and what is safe and not safe to do. Pt mobilizing independently.  See below for any follow-up Physical Therapy or equipment needs. PT is signing off. Thank you for this referral.         If plan is discharge home, recommend the following: Assistance with cooking/housework   Can travel by private vehicle        Equipment Recommendations None recommended by PT  Recommendations for Other Services       Functional Status Assessment Patient has not had a recent decline in their functional status     Precautions / Restrictions Precautions Precautions: Back Precaution Booklet Issued: No Recall of Precautions/Restrictions: Impaired Precaution/Restrictions Comments: All precautions reviewed, needed reminding of no twisting. Pt understands but is impulsive and needs cues to keep precautions in functional situations Required Braces or Orthoses:  (no brace needed order) Restrictions Weight Bearing Restrictions Per Provider Order: No      Mobility  Bed Mobility Overal bed mobility: Needs Assistance Bed Mobility: Supine to Sit     Supine  to sit: Supervision     General bed mobility comments: pt lying on back with HOB elevated and cued, "show me how you're getting up keeping  your precautions" (which were just reviewed). Pt sat straight up in bed despite cues that this is not the ideal way to get up. He reports he is limited in rolling due to UE weakness from c spine and used overhead handles that he has from ceiling. Reviewed precautions again and how keeping them will help decrease the need for further interventions    Transfers Overall transfer level: Needs assistance Equipment used: None Transfers: Sit to/from Stand Sit to Stand: Modified independent (Device/Increase time)           General transfer comment: safe with sit>stand even from low surface    Ambulation/Gait Ambulation/Gait assistance: Supervision Gait Distance (Feet): 400 Feet Assistive device: None Gait Pattern/deviations: Step-through pattern Gait velocity: WFL Gait velocity interpretation: >4.37 ft/sec, indicative of normal walking speed   General Gait Details: pt makes quick moves/ changes in direction. Encouraged to maintain his pace which is [facility] but slow down his directional changes so he can think about how he's moving  Stairs Stairs: Yes Stairs assistance: Supervision Stair Management: One rail Right, Alternating pattern, Forwards Number of Stairs: 6 General stair comments: safe with use of rail  Wheelchair Mobility     Tilt Bed    Modified Rankin (Stroke Patients Only)       Balance Overall balance assessment: Mild deficits observed, not formally tested  Pertinent Vitals/Pain Pain Assessment Pain Assessment: 0-10 Pain Score: 7  Pain Location: back Pain Descriptors / Indicators: Sore, Discomfort Pain Intervention(s): Limited activity within patient's tolerance, Monitored during session    Home Living Family/patient expects to be discharged to:: Private  residence Living Arrangements: Alone Available Help at Discharge: Family;Neighbor Type of Home: House Home Access: Stairs to enter Entrance Stairs-Rails:  (has bil rails in back, no rails in front) Entrance Stairs-Number of Steps: 2   Home Layout: One level Home Equipment: Cane - single point;Adaptive equipment Additional Comments: pt has a woodstove for heat. Discussed that he cannot chop, haul, or load wood currently. He reports that a neighbor will help him. He also has various other limitations with his home that he will need assistance with    Prior Function Prior Level of Function : Independent/Modified Independent             Mobility Comments: no AD with pt reportng that spaces are too narrow in house to use them ADLs Comments: independent in ADL and IADL     Extremity/Trunk Assessment   Upper Extremity Assessment Upper Extremity Assessment: Defer to OT evaluation RUE Deficits / Details: decreased sensation along ulnar nerve distribution LUE Deficits / Details: decresaed sensation and pain along radial nerve distribution.  Pt reports history of cervical surgery    Lower Extremity Assessment Lower Extremity Assessment: Overall WFL for tasks assessed    Cervical / Trunk Assessment Cervical / Trunk Assessment: Back Surgery  Communication   Communication Communication: No apparent difficulties    Cognition Arousal: Alert Behavior During Therapy: WFL for tasks assessed/performed, Impulsive   PT - Cognitive impairments: No apparent impairments                       PT - Cognition Comments: cues needed for attention. Pt somewhat aware of his tendency to be impulsive. Encouraged him to slow things down for the next month Following commands: Impaired Following commands impaired: Only follows one step commands consistently, Follows one step commands with increased time (redirection to avoid breaking precautions despite being able to follow commands.)      Cueing Cueing Techniques: Verbal cues, Gestural cues     General Comments General comments (skin integrity, edema, etc.): discussed different tasks at home and how to do them keeping precautions. Reviewed abdominal activation and proper posture in standing    Exercises     Assessment/Plan    PT Assessment Patient does not need any further PT services  PT Problem List         PT Treatment Interventions      PT Goals (Current goals can be found in the Care Plan section)  Acute Rehab PT Goals Patient Stated Goal: return home PT Goal Formulation: All assessment and education complete, DC therapy    Frequency       Co-evaluation               AM-PAC PT "6 Clicks" Mobility  Outcome Measure Help needed turning from your back to your side while in a flat bed without using bedrails?: None Help needed moving from lying on your back to sitting on the side of a flat bed without using bedrails?: None Help needed moving to and from a bed to a chair (including a wheelchair)?: None Help needed standing up from a chair using your arms (e.g., wheelchair or bedside chair)?: None Help needed to walk in hospital room?: None Help needed climbing 3-5 steps with  a railing? : None 6 Click Score: 24    End of Session   Activity Tolerance: Patient tolerated treatment well Patient left: in bed;with call bell/phone within reach Nurse Communication: Mobility status PT Visit Diagnosis: Unsteadiness on feet (R26.81);Pain Pain - part of body:  (back)    Time: 1610-9604 PT Time Calculation (min) (ACUTE ONLY): 22 min   Charges:   PT Evaluation $PT Eval Moderate Complexity: 1 Mod   PT General Charges $$ ACUTE PT VISIT: 1 Visit         Lyanne Co, PT  Acute Rehab Services Secure chat preferred Office [phone]   Lawana Chambers Trinidee Schrag 10/08/2023, 11:34 AM

## 2023-10-08 NOTE — Discharge Summary (Signed)
Physician Discharge Summary  Patient ID: FED CECI MRN: [id number] DOB/AGE: 1966/05/12 58 y.o.  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  History of fusion L5-S1.  Lumbar radiculopathy. Principal Problem:   Other spondylosis with radiculopathy, lumbar region   Discharged Condition: good  Hospital Course: Patient was admitted to undergo surgical decompression and L4-L5 via laminotomies which she tolerated well.  Consults: None  Significant Diagnostic Studies: None  Treatments: surgery: See op note  Discharge Exam: Blood pressure 100/68, pulse 73, temperature 97.9 F (36.6 C), temperature source Oral, resp. rate 18, height 6\' 2"  (1.88 m), weight 101.6 kg, SpO2 96%. Incision is clean and dry Station and gait are intact.  Disposition: Discharge disposition: 01-Home or Self Care       Discharge Instructions     Call MD for:  redness, tenderness, or signs of infection (pain, swelling, redness, odor or green/yellow discharge around incision site)   Complete by: As directed    Call MD for:  severe uncontrolled pain   Complete by: As directed    Call MD for:  temperature >100.4   Complete by: As directed    Diet - low sodium heart healthy   Complete by: As directed    Discharge instructions   Complete by: As directed    Okay to shower. Do not apply salves or appointments to incision. No heavy lifting with the upper extremities greater than 10 pounds. May resume driving when not requiring pain medication and patient feels comfortable with doing so.   Incentive spirometry RT   Complete by: As directed    Increase activity slowly   Complete by: As directed       Allergies as of 10/08/2023   No Known Allergies      Medication List     STOP taking these medications     oxyCODONE-acetaminophen 10-325 MG tablet Commonly known as: PERCOCET Replaced by: oxyCODONE-acetaminophen 5-325 MG tablet       TAKE these medications    Accu-Chek Guide test strip Generic drug: glucose blood USE AS DIRECTED 2 TIMES DAILY   Blood Pressure Monitor Automat Devi Check blood pressure in AM and PM daily   chlorhexidine 4 % external liquid Commonly known as: HIBICLENS Apply 15 mLs (1 Application total) topically as directed for 30 doses. Use as directed daily for 5 days every other week for 6 weeks.   cyclobenzaprine 5 MG tablet Commonly known as: FLEXERIL Take 1 tablet (5 mg total) by mouth 3 (three) times daily as needed for muscle spasms.   Farxiga 5 MG Tabs tablet Generic drug: dapagliflozin propanediol Take 1 tablet (5 mg total) by mouth daily before breakfast.   ibuprofen 200 MG tablet Commonly known as: ADVIL Take 800 mg by mouth every 6 (six) hours as needed for moderate pain (pain score 4-6).   imatinib 400 MG tablet Commonly known as: GLEEVEC Take 1 tablet (400 mg total) by mouth daily.   Lantus SoloStar 100 UNIT/ML Solostar Pen Generic drug: insulin glargine Inject 7 Units into the skin every morning.   lisinopril 20 MG tablet Commonly known as: ZESTRIL Take 1 tablet (20 mg total) by mouth daily.   methocarbamol 500 MG tablet Commonly known as: ROBAXIN Take 1 tablet (500 mg total) by mouth every 6 (six) hours as needed for muscle spasms.   mupirocin ointment 2 % Commonly known  as: BACTROBAN Place 1 Application into the nose 2 (two) times daily for 60 doses. Use as directed 2 times daily for 5 days every other week for 6 weeks.   oxyCODONE-acetaminophen 5-325 MG tablet Commonly known as: PERCOCET/ROXICET Take 1-2 tablets by mouth every 6 (six) hours as needed for severe pain (pain score 7-10). Replaces: oxyCODONE-acetaminophen 10-325 MG tablet   pregabalin 100 MG capsule Commonly known as: Lyrica Take 1 capsule (100 mg total) by  mouth 2 (two) times daily.   sodium chloride 0.65 % Soln nasal spray Commonly known as: OCEAN Place 1 spray into both nostrils as needed for congestion.   TechLite Pen Needles 32G X 4 MM Misc Generic drug: Insulin Pen Needle Use to inject Victoza once a day         Signed: Stefani Dama 10/08/2023, 12:59 PM

## 2023-10-08 NOTE — Plan of Care (Signed)

## 2023-10-08 NOTE — Progress Notes (Signed)
Patient in no acute distress nor complaints of pain nor discomfort; incision on back is clean, dry and intact; No c/o pain at this time. Room was checked and accounted for all patient's belongings; discharge instructions concerning her medications, incision care, follow up appointment and when to call the doctor as needed were all discussed with patient by RN and he expressed understanding on the instructions given.

## 2023-10-09 ENCOUNTER — Other Ambulatory Visit (HOSPITAL_COMMUNITY): Payer: Self-pay

## 2023-10-09 ENCOUNTER — Other Ambulatory Visit: Payer: Self-pay

## 2023-10-10 ENCOUNTER — Other Ambulatory Visit: Payer: Self-pay

## 2023-10-10 ENCOUNTER — Inpatient Hospital Stay: Payer: Medicaid Other | Admitting: Oncology

## 2023-10-10 ENCOUNTER — Inpatient Hospital Stay (HOSPITAL_BASED_OUTPATIENT_CLINIC_OR_DEPARTMENT_OTHER): Payer: Medicaid Other | Admitting: Oncology

## 2023-10-10 ENCOUNTER — Encounter: Payer: Self-pay | Admitting: Oncology

## 2023-10-10 ENCOUNTER — Other Ambulatory Visit (HOSPITAL_COMMUNITY): Payer: Self-pay

## 2023-10-10 DIAGNOSIS — C9210 Chronic myeloid leukemia, BCR/ABL-positive, not having achieved remission: Secondary | ICD-10-CM

## 2023-10-10 NOTE — Progress Notes (Unsigned)
[facility]  [phone] [phone]  ID: Gerald Olson OB: [birth year]  MR#: [id number]  AOZ#:[id number]  Patient Care Team: Patient, [name] as PCP - General (General Practice) Jeralyn Ruths, MD as Consulting Physician (Oncology) Craft, Calvert Cantor, RN as Case Manager  I connected with Gerald Olson on 10/10/23 at  2:45 PM EST by video enabled telemedicine visit and verified that I am speaking with the correct person using two identifiers.   I discussed the limitations, risks, security and privacy concerns of performing an evaluation and management service by telemedicine and the availability of in-person appointments. I also discussed with the patient that there may be a patient responsible charge related to this service. The patient expressed understanding and agreed to proceed.   Other persons participating in the visit and their role in the encounter: Patient, MD.  Patient's location: Home. Provider's location: Clinic.  CHIEF COMPLAINT: CML.  INTERVAL HISTORY: Patient agreed to video-assisted telemedicine visit for further evaluation and discussion of his laboratory results.  He currently feels well and is asymptomatic.  He continues to tolerate Gleevec without significant side effects, though he admits he only takes it 5 or 6 days/week. He continues to have chronic back and neck pain.  He has no neurologic complaints. He denies any recent fevers or illnesses. He has a good appetite and denies weight loss. He has no chest pain, shortness of breath, cough, or hemoptysis. He denies any nausea, vomiting, constipation, or diarrhea. He has no melena or hematochezia. He has no urinary complaints.  Patient offers no further specific complaints today.  REVIEW OF SYSTEMS:   Review of Systems  Constitutional: Negative.  Negative for fever, malaise/fatigue and weight loss.  Respiratory: Negative.  Negative for cough, hemoptysis and shortness of breath.    Cardiovascular: Negative.  Negative for chest pain and leg swelling.  Gastrointestinal: Negative.  Negative for abdominal pain, blood in stool and diarrhea.  Genitourinary: Negative.  Negative for dysuria, flank pain and hematuria.  Musculoskeletal:  Positive for back pain and neck pain.  Skin: Negative.  Negative for rash.  Neurological: Negative.  Negative for dizziness, focal weakness, weakness and headaches.  Psychiatric/Behavioral: Negative.  The patient is not nervous/anxious.     As per HPI. Otherwise, a complete review of systems is negative.  PAST MEDICAL HISTORY: Past Medical History:  Diagnosis Date   ADHD (attention deficit hyperactivity disorder)    Diagnosed during teenage years   Alcohol use disorder    On average 14-21 drinks per week   Alcoholic cirrhosis of liver    Per patient - condition is not related to moderate alcohol use but rather prolonged NSAID use and recent abuse.   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[place]   Major depressive disorder    Other spondylosis with radiculopathy, cervical region    Retinopathy    Right eye; per patient report   Type 2 diabetes mellitus with hyperglycemia     PAST SURGICAL HISTORY: Past  Surgical History:  Procedure Laterality Date   CHOLECYSTECTOMY N/A 06/02/2018   Procedure: LAPAROSCOPIC CHOLECYSTECTOMY;  Surgeon: Ancil Linsey, MD;  Location: [facility] ORS;  Service: General;  Laterality: N/A;   KNEE ARTHROSCOPY Right    30 Years ago   lumbar five-sacral one posterior lumbar interbody fusion  01/24/2021    LUMBAR LAMINECTOMY/DECOMPRESSION MICRODISCECTOMY Bilateral 10/07/2023   Procedure: Laminectomy for Facet/Synovial Cyst - Lumbar Four-Lumbar Five - Bilateral;  Surgeon: Barnett Abu, MD;  Location: [facility] OR;  Service: Neurosurgery;  Laterality: Bilateral;  C3    FAMILY HISTORY: Family History  Problem Relation Age of Onset   Aneurysm Father    Drug abuse Sister    Multiple sclerosis Sister    Suicidality Maternal Aunt    Emphysema Maternal Grandfather    Diabetes Neg Hx     ADVANCED DIRECTIVES (Y/N):  N  HEALTH MAINTENANCE: Social History   Tobacco Use   Smoking status: Former    Current packs/day: 0.00    Types: Cigarettes    Quit date: 06/25/2003    Years since quitting: 20.3   Smokeless tobacco: Never   Tobacco comments:    Currently vapes  Vaping Use   Vaping status: Every Day   Substances: CBD  Substance Use Topics   Alcohol use: Yes    Alcohol/week: 21.0 - 42.0 standard drinks of alcohol    Types: 21 - 42 Cans of beer per week    Comment: 2-3 beers per night on average; sometimes up to 6   Drug use: Yes    Types: Marijuana    Comment: twice/week to help sleep; edibles, does not smoke     Colonoscopy:  PAP:  Bone density:  Lipid panel:  No Known Allergies   Current Outpatient Medications  Medication Sig Dispense Refill   dapagliflozin propanediol (FARXIGA) 5 MG TABS tablet Take 1 tablet (5 mg total) by mouth daily before breakfast. 90 tablet 3   glucose blood (ACCU-CHEK GUIDE) test strip USE AS DIRECTED 2 TIMES DAILY 180 strip 0   ibuprofen (ADVIL) 200 MG tablet Take 800 mg by mouth every 6 (six) hours as needed for moderate pain (pain score 4-6).     insulin glargine (LANTUS SOLOSTAR) 100 UNIT/ML Solostar Pen Inject 7 Units into the skin every morning. 15 mL 3   Insulin Pen Needle (TECHLITE PEN NEEDLES) 32G X 4 MM MISC Use to inject Victoza once a day 100 each 11   methocarbamol (ROBAXIN) 500 MG tablet Take 1 tablet (500 mg total) by mouth every 6 (six)  hours as needed for muscle spasms. 30 tablet 2   mupirocin ointment (BACTROBAN) 2 % Place 1 Application into the nose 2 (two) times daily for 60 doses. Use as directed 2 times daily for 5 days every other week for 6 weeks. 66 g 0   oxyCODONE-acetaminophen (PERCOCET/ROXICET) 5-325 MG tablet Take 1-2 tablets by mouth every 6 (six) hours as needed for severe pain (pain score 7-10). 40 tablet 0   Blood Pressure Monitoring (BLOOD PRESSURE MONITOR AUTOMAT) DEVI Check blood pressure in AM and PM daily (Patient not taking: Reported on 10/10/2023) 1 each 0   chlorhexidine (HIBICLENS) 4 % external liquid Apply 15 mLs (1 Application total) topically as directed for 30 doses. Use as directed daily for 5 days every other week for 6 weeks. (Patient not taking: Reported on 10/10/2023) 946 mL 1   cyclobenzaprine (FLEXERIL) 5 MG tablet Take 1 tablet (5 mg total) by mouth 3 (three) times daily as  needed for muscle spasms. (Patient not taking: Reported on 10/10/2023) 20 tablet 0   imatinib (GLEEVEC) 400 MG tablet Take 1 tablet (400 mg total) by mouth daily. (Patient not taking: Reported on 09/27/2023) 30 tablet 5   lisinopril (ZESTRIL) 20 MG tablet Take 1 tablet (20 mg total) by mouth daily. (Patient not taking: Reported on 09/27/2023) 90 tablet 3   pregabalin (LYRICA) 100 MG capsule Take 1 capsule (100 mg total) by mouth 2 (two) times daily. (Patient not taking: Reported on 09/27/2023) 60 capsule 6   sodium chloride (OCEAN) 0.65 % SOLN nasal spray Place 1 spray into both nostrils as needed for congestion. (Patient not taking: Reported on 10/10/2023)     No current facility-administered medications for this visit.    OBJECTIVE: There were no vitals filed for this visit.    There is no height or weight on file to calculate BMI.    ECOG FS:0 - Asymptomatic  General: Well-developed, well-nourished, no acute distress. HEENT: Normocephalic. Neuro: Alert, answering all questions appropriately. Cranial nerves grossly  intact. Psych: Normal affect.  LAB RESULTS:  Lab Results  Component Value Date   NA 132 (L) 10/04/2023   K 4.0 10/04/2023   CL 104 10/04/2023   CO2 22 10/04/2023   GLUCOSE 195 (H) 10/04/2023   BUN 15 10/04/2023   CREATININE 0.79 10/04/2023   CALCIUM 8.2 (L) 10/04/2023   PROT 5.7 (L) 10/04/2023   ALBUMIN 3.5 10/04/2023   AST 43 (H) 10/04/2023   ALT 46 (H) 10/04/2023   ALKPHOS 74 10/04/2023   BILITOT 0.5 10/04/2023   GFRNONAA >60 10/04/2023   GFRAA 90 09/14/2020    Lab Results  Component Value Date   WBC 5.0 10/04/2023   NEUTROABS 3.3 10/04/2023   HGB 12.3 (L) 10/04/2023   HCT 39.6 10/04/2023   MCV 81.0 10/04/2023   PLT 309 10/04/2023     STUDIES: DG Lumbar Spine 1 View Result Date: 10/07/2023 CLINICAL DATA:  Elective surgery. EXAM: LUMBAR SPINE - 1 VIEW COMPARISON:  Lumbar spine radiographs 09/18/2023 and MRI 09/12/2023 FINDINGS: A single lateral radiograph of the lumbar spine is provided. Previous fusion is again seen at L5-S1. The tip of a needle or other metallic instrument projects over the posterior elements at the L4-5 level. IMPRESSION: Lumbar spine radiograph for intraoperative localization. Electronically Signed   By: Sebastian Ache M.D.   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  Repeat CT scan results from July 18, 2020 reviewed independently with only mild abdominal lymphadenopathy and mild splenomegaly that are chronic and unchanged.  Bone marrow biopsy was negative for disease.  Patient was initiated on 400 mg Gleevec in approximately November 2021 and has been in and continues to be in a complete molecular remission since January 2022.  His most recent BCR-ABL test on March 05, 2023 continues to be negative.  Continue Gleevec 400 mg daily.  If patient remains in complete molecular remission for 3 years in November  2024, can consider discontinuing Gleevec.  Return to clinic in 3 months for laboratory work only and then in 6 months with laboratory work and video-assisted telemedicine visit.   Anemia: Chronic and unchanged.  Patient's hemoglobin is 11.6.  Possibly nutritional. Splenomegaly/mesenteric lymphadenopathy: Unlikely related to CML. Back/neck pain: Chronic and unchanged.    I provided 30 minutes of face-to-face video  visit time during this encounter which included chart review, counseling, and coordination of care as documented above.   Patient expressed understanding and was in agreement with this plan. He also understands that He can call clinic at any time with any questions, concerns, or complaints.    Jeralyn Ruths, MD   10/10/2023 3:22 PM

## 2023-10-11 ENCOUNTER — Telehealth: Payer: Self-pay | Admitting: *Deleted

## 2023-10-11 LAB — BCR-ABL1, CML/ALL, PCR, QUANT
E1A2 Transcript: <0.0032 %
Interpretation (BCRAL):: NEGATIVE
b2a2 transcript: <0.0032 %
b3a2 transcript: <0.0032 %

## 2023-10-11 NOTE — Progress Notes (Signed)
[facility]  Telephone:(336) [phone] Fax:(336) [phone]  ID: Gerald Olson OB: 25-Jan-1966  MR#: [id number]  XLK#:[id number]  Patient Care Team: Patient, [name] as PCP - General (General Practice) Jeralyn Ruths, MD as Consulting Physician (Oncology) Craft, Calvert Cantor, RN as Case Manager  I connected with Gerald Olson on 10/11/23 at  2:45 PM EST by video enabled telemedicine visit and verified that I am speaking with the correct person using two identifiers.   I discussed the limitations, risks, security and privacy concerns of performing an evaluation and management service by telemedicine and the availability of in-person appointments. I also discussed with the patient that there may be a patient responsible charge related to this service. The patient expressed understanding and agreed to proceed.   Other persons participating in the visit and their role in the encounter: Patient, MD.  Patient's location: Home. Provider's location: Clinic.   CHIEF COMPLAINT: CML.  INTERVAL HISTORY: Patient agreed to video-assisted telemedicine visit for further evaluation and discussion of his laboratory results.  Visit was subsequently changed to telephone secondary to technical difficulties.  Patient was discharged from the hospital 2 days ago from back surgery and is still has significant pain, but otherwise feels well.  He states he has not taken his Gleevac in over a month secondary to financial issues.  He has no neurologic complaints. He denies any recent fevers or illnesses. He has a good appetite and denies weight loss. He has no chest pain, shortness of breath, cough, or hemoptysis. He denies any nausea, vomiting, constipation, or diarrhea. He has no melena or hematochezia. He has no urinary complaints.  Patient offers no further specific complaints today.  REVIEW OF SYSTEMS:   Review of Systems  Constitutional: Negative.  Negative for fever, malaise/fatigue and weight  loss.  Respiratory: Negative.  Negative for cough, hemoptysis and shortness of breath.   Cardiovascular: Negative.  Negative for chest pain and leg swelling.  Gastrointestinal: Negative.  Negative for abdominal pain, blood in stool and diarrhea.  Genitourinary: Negative.  Negative for dysuria, flank pain and hematuria.  Musculoskeletal:  Positive for back pain and neck pain.  Skin: Negative.  Negative for rash.  Neurological: Negative.  Negative for dizziness, focal weakness, weakness and headaches.  Psychiatric/Behavioral: Negative.  The patient is not nervous/anxious.     As per HPI. Otherwise, a complete review of systems is negative.  PAST MEDICAL HISTORY: Past Medical History:  Diagnosis Date   ADHD (attention deficit hyperactivity disorder)    Diagnosed during teenage years   Alcohol use disorder    On average 14-21 drinks per week   Alcoholic cirrhosis of liver    Per patient - condition is not related to moderate alcohol use but rather prolonged NSAID use and recent abuse.   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[place]   Major depressive disorder    Other spondylosis with radiculopathy, cervical region    Retinopathy    Right eye; per patient report  Type 2 diabetes mellitus with hyperglycemia     PAST SURGICAL HISTORY: Past Surgical History:  Procedure Laterality Date   CHOLECYSTECTOMY N/A 06/02/2018   Procedure: LAPAROSCOPIC CHOLECYSTECTOMY;  Surgeon: Ancil Linsey, MD;  Location: [facility] ORS;  Service: General;  Laterality: N/A;   KNEE ARTHROSCOPY Right     30 Years ago   lumbar five-sacral one posterior lumbar interbody fusion  01/24/2021   LUMBAR LAMINECTOMY/DECOMPRESSION MICRODISCECTOMY Bilateral 10/07/2023   Procedure: Laminectomy for Facet/Synovial Cyst - Lumbar Four-Lumbar Five - Bilateral;  Surgeon: Barnett Abu, MD;  Location: [facility] OR;  Service: Neurosurgery;  Laterality: Bilateral;  C3    FAMILY HISTORY: Family History  Problem Relation Age of Onset   Aneurysm Father    Drug abuse Sister    Multiple sclerosis Sister    Suicidality Maternal Aunt    Emphysema Maternal Grandfather    Diabetes Neg Hx     ADVANCED DIRECTIVES (Y/N):  N  HEALTH MAINTENANCE: Social History   Tobacco Use   Smoking status: Former    Current packs/day: 0.00    Types: Cigarettes    Quit date: 06/25/2003    Years since quitting: 20.3   Smokeless tobacco: Never   Tobacco comments:    Currently vapes  Vaping Use   Vaping status: Every Day   Substances: CBD  Substance Use Topics   Alcohol use: Yes    Alcohol/week: 21.0 - 42.0 standard drinks of alcohol    Types: 21 - 42 Cans of beer per week    Comment: 2-3 beers per night on average; sometimes up to 6   Drug use: Yes    Types: Marijuana    Comment: twice/week to help sleep; edibles, does not smoke     Colonoscopy:  PAP:  Bone density:  Lipid panel:  No Known Allergies   Current Outpatient Medications  Medication Sig Dispense Refill   Blood Pressure Monitoring (BLOOD PRESSURE MONITOR AUTOMAT) DEVI Check blood pressure in AM and PM daily (Patient not taking: Reported on 10/10/2023) 1 each 0   chlorhexidine (HIBICLENS) 4 % external liquid Apply 15 mLs (1 Application total) topically as directed for 30 doses. Use as directed daily for 5 days every other week for 6 weeks. (Patient not taking: Reported on 10/10/2023) 946 mL 1   cyclobenzaprine (FLEXERIL) 5 MG tablet Take 1 tablet (5 mg total) by mouth 3 (three) times daily as needed for muscle spasms. (Patient not taking: Reported on  10/10/2023) 20 tablet 0   dapagliflozin propanediol (FARXIGA) 5 MG TABS tablet Take 1 tablet (5 mg total) by mouth daily before breakfast. 90 tablet 3   glucose blood (ACCU-CHEK GUIDE) test strip USE AS DIRECTED 2 TIMES DAILY 180 strip 0   ibuprofen (ADVIL) 200 MG tablet Take 800 mg by mouth every 6 (six) hours as needed for moderate pain (pain score 4-6).     imatinib (GLEEVEC) 400 MG tablet Take 1 tablet (400 mg total) by mouth daily. (Patient not taking: Reported on 09/27/2023) 30 tablet 5   insulin glargine (LANTUS SOLOSTAR) 100 UNIT/ML Solostar Pen Inject 7 Units into the skin every morning. 15 mL 3   Insulin Pen Needle (TECHLITE PEN NEEDLES) 32G X 4 MM MISC Use to inject Victoza once a day 100 each 11   lisinopril (ZESTRIL) 20 MG tablet Take 1 tablet (20 mg total) by mouth daily. (Patient not taking: Reported on 09/27/2023) 90 tablet 3   methocarbamol (ROBAXIN) 500 MG tablet Take 1 tablet (500 mg total) by mouth  every 6 (six) hours as needed for muscle spasms. 30 tablet 2   mupirocin ointment (BACTROBAN) 2 % Place 1 Application into the nose 2 (two) times daily for 60 doses. Use as directed 2 times daily for 5 days every other week for 6 weeks. 66 g 0   oxyCODONE-acetaminophen (PERCOCET/ROXICET) 5-325 MG tablet Take 1-2 tablets by mouth every 6 (six) hours as needed for severe pain (pain score 7-10). 40 tablet 0   pregabalin (LYRICA) 100 MG capsule Take 1 capsule (100 mg total) by mouth 2 (two) times daily. (Patient not taking: Reported on 09/27/2023) 60 capsule 6   sodium chloride (OCEAN) 0.65 % SOLN nasal spray Place 1 spray into both nostrils as needed for congestion. (Patient not taking: Reported on 10/10/2023)     No current facility-administered medications for this visit.    OBJECTIVE: There were no vitals filed for this visit.    There is no height or weight on file to calculate BMI.    ECOG FS:1 - Symptomatic but completely ambulatory  General: Well-developed, well-nourished, no acute  distress. HEENT: Normocephalic. Neuro: Alert, answering all questions appropriately. Cranial nerves grossly intact. Psych: Normal affect.  LAB RESULTS:  Lab Results  Component Value Date   NA 132 (L) 10/04/2023   K 4.0 10/04/2023   CL 104 10/04/2023   CO2 22 10/04/2023   GLUCOSE 195 (H) 10/04/2023   BUN 15 10/04/2023   CREATININE 0.79 10/04/2023   CALCIUM 8.2 (L) 10/04/2023   PROT 5.7 (L) 10/04/2023   ALBUMIN 3.5 10/04/2023   AST 43 (H) 10/04/2023   ALT 46 (H) 10/04/2023   ALKPHOS 74 10/04/2023   BILITOT 0.5 10/04/2023   GFRNONAA >60 10/04/2023   GFRAA 90 09/14/2020    Lab Results  Component Value Date   WBC 5.0 10/04/2023   NEUTROABS 3.3 10/04/2023   HGB 12.3 (L) 10/04/2023   HCT 39.6 10/04/2023   MCV 81.0 10/04/2023   PLT 309 10/04/2023     STUDIES: DG Lumbar Spine 1 View Result Date: 10/07/2023 CLINICAL DATA:  Elective surgery. EXAM: LUMBAR SPINE - 1 VIEW COMPARISON:  Lumbar spine radiographs 09/18/2023 and MRI 09/12/2023 FINDINGS: A single lateral radiograph of the lumbar spine is provided. Previous fusion is again seen at L5-S1. The tip of a needle or other metallic instrument projects over the posterior elements at the L4-5 level. IMPRESSION: Lumbar spine radiograph for intraoperative localization. Electronically Signed   By: Sebastian Ache M.D.   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  Repeat CT scan results from July 18, 2020 reviewed independently with only mild abdominal lymphadenopathy and mild splenomegaly that are chronic and unchanged.  Bone marrow biopsy was negative for disease.  Patient was initiated on 400 mg Gleevec in approximately November 2021 and has been in and continues to be in a complete molecular remission since January 2022.  His most recent BCR-ABL on June 12, 2023 revealed continued  remission despite patient's noncompliance with treatment.  His most recent results are pending at time of dictation.  Continue Gleevec 400 mg daily.  Return to clinic in 3 months for laboratory work only and then in 6 months for laboratory work and further evaluation.  If patient continues to remain in complete molecular remission at that appointment, can consider discontinuing Gleevec.   Anemia: Essentially resolved.  Patient's hemoglobin is 12.3. Splenomegaly/mesenteric lymphadenopathy: Unlikely related to CML. Back/neck pain: Patient underwent surgery and reports he would likely need a second surgery in the near future.  I provided 30 minutes of face-to-face video visit time during this encounter which included chart review, counseling, and coordination of care as documented above.    Patient expressed understanding and was in agreement with this plan. He also understands that He can call clinic at any time with any questions, concerns, or complaints.    Jeralyn Ruths, MD   10/11/2023 10:08 AM

## 2023-10-11 NOTE — Telephone Encounter (Signed)
Call placed to patient regarding results of BCR-ABL lab, per Dr. Orlie Dakin lab is negative. Patient aware and and verbalized understanding.

## 2023-10-14 ENCOUNTER — Other Ambulatory Visit: Payer: Self-pay | Admitting: Obstetrics and Gynecology

## 2023-10-14 NOTE — Patient Instructions (Signed)
Visit Information  Gerald Olson was given information about Medicaid Managed Care team care coordination services as a part of their Healthy [facility] Medicaid benefit. Gerald Olson verbally consented to engagement with the [facility] Managed Care team.   If you are experiencing a medical emergency, please call 911 or report to your local emergency department or urgent care.   If you have a non-emergency medical problem during routine business hours, please contact your provider's office and ask to speak with a nurse.   For questions related to your Healthy [facility] health plan, please call: [phone] or visit the homepage here: [email or web address]  If you would like to schedule transportation through your Healthy [facility] plan, please call the following number at least 2 days in advance of your appointment: [phone]  For information about your ride after you set it up, call Ride Assist at [phone]. Use this number to activate a Will Call pickup, or if your transportation is late for a scheduled pickup. Use this number, too, if you need to make a change or cancel a previously scheduled reservation.  If you need transportation services right away, call [phone]. The after-hours call center is staffed 24 hours to handle ride assistance and urgent reservation requests (including discharges) 365 days a year. Urgent trips include sick visits, hospital discharge requests and life-sustaining treatment.  Call the [facility] Line at [phone], at any time, 24 hours a day, 7 days a week. If you are in danger or need immediate medical attention call 911.  If you would like help to quit smoking, call 1-800-QUIT-NOW ((365)738-2465) OR Espaol: 1-855-Djelo-Ya (4-742-595-6387) o para ms informacin haga clic aqu or Text READY to 564-332 to register via text  [name] - following are the goals we discussed in your visit today:   Goals  Addressed             This Visit's Progress    RNCM - Chronic Back Pain Management       Timeframe:  Long-Range Goal Priority:  High Start Date:   05/09/2021                          Expected End Date: ongoing  Patient Goals: Patient will self administer medications as prescribed Patient will attend all scheduled provider appointments Patient will call pharmacy for medication refills Patient will continue to perform ADL's independently Patient will continue to perform IADL's independently Patient will call provider office for new concerns or questions Patient will work with BSW to address care coordination needs and will continue to work with the clinical team to address health care and disease management related needs.   Patient's pain will be managed to a tolerable pain level where patient can be functional with ADL's and IADL's.    10/14/23: s/p laminectomy 2/10-doing well     RNCM - CML Disease Progression Minimized or Managed       Timeframe:  Long-Range Goal Priority:  High Start Date:     05/09/2021                       Expected End Date: ongoing  Patient Goals: Patient will self administer medications as prescribed Patient will attend all scheduled provider appointments Patient will call pharmacy for medication refills Patient will continue to perform ADL's independently Patient will continue to perform IADL's independently Patient will call provider office for new concerns or questions Patient will work with [facility]  to address care coordination needs and will continue to work with the clinical team to address health care and disease management related needs.  10/14/23:  Followed by ONC.-remission                        RNCM - Glycemic Management & Monitored       Timeframe:  Long-Range Goal Priority:  High Start Date:     05/09/2021                        Expected End Date:  ongoing  Patient Goals: Patient will self administer medications as prescribed Patient will  attend all scheduled provider appointments Patient will call pharmacy for medication refills Patient will continue to perform ADL's independently Patient will continue to perform IADL's independently Patient will call provider office for new concerns or questions Patient will work with BSW to address care coordination needs and will continue to work with the clinical team to address health care and disease management related needs.      Patient monitor blood sugars twice a day at varies times using Accu-check guide and record results. A1C level will remain at 6.0 or below.    10/14/23: Blood sugar 137., A1C-6.7.  ENDO 4/8.      RNCM - Hypertension Monitored and Managed       Timeframe:  Long-Range Goal Priority:  High Start Date:  05/09/2021                           Expected End Date:  ongoing  Patient Goals Patient will self administer medications as prescribed Patient will attend all scheduled provider appointments Patient will call pharmacy for medication refills Patient will continue to perform ADL's independently Patient will continue to perform IADL's independently Patient will call provider office for new concerns or questions Patient will work with BSW to address care coordination needs and will continue to work with the clinical team to address health care and disease management related needs.    Patient will check blood pressure with new blood pressure monitor at least once a week and record readings.          10/14/23: BP 103/82 per patient   Patient verbalizes understanding of instructions and care plan provided today and agrees to view in MyChart. Active MyChart status and patient understanding of how to access instructions and care plan via MyChart confirmed with patient.     The Managed Medicaid care management team will reach out to the patient again over the next 60 business  days.  The  Patient  has been provided with contact information for the Managed Medicaid care  management team and has been advised to call with any health related questions or concerns.   Kathi Der RN, BSN, [employer] [facility] RN Care Manager Direct Dial [phone]/WGN [phone] Website: [email or web address]   Following is a copy of your plan of care:  Care Plan : RNCM Plan of Care  Updates made by Danie Chandler, RN since 10/14/2023 12:00 AM     Problem: Chronic Disease Management and Care Coordination Needs for DM, HTN, CML and Back Pain      Long-Range Goal: Plan of Care for Chronic Disease management and Care Coordination Needs (HTN, DM, CML, Chronic Back Pain)   Start Date: 05/09/2021  Expected End Date: 01/11/2024  Priority: High  Note:   Current Barriers:  Knowledge Deficits related to plan of care for management of HTN, DMII, and CML and Chronic Back Pain  Chronic Disease Management support and education needs related to HTN, DMII, and CML and Chronic Back Pain 10/14/23: s/p laminectomy 2/10, recovering well.  BP and BG stable  RNCM Clinical Goal(s):  Patient will verbalize understanding of plan for management of HTN, DMII, and CML and Chronic Back Pain verbalize basic understanding of HTN, DMII, and CML and Chronic Back Pain disease process and self health management plan  take all medications exactly as prescribed and will call provider for medication related questions attend all scheduled medical appointments demonstrate ongoing adherence to prescribed treatment plan for HTN, DMII, and CML and Chronic Back Pain as evidenced by daily monitoring and recording of CBG, adherence to ADA/ carb modified diet, adherence to prescribed medication regimen, contacting provider for new or worsened symptoms or questions  demonstrate ongoing health management independence  continue to work with RN Care Manager to address care management and care coordination needs related to HTN, DMII, and CML and Chronic Back Pain  work with pharmacist to address  complex medication regimen  related to HTN, DMII, and CML and Chronic Back Pain work with Child psychotherapist to address financial constraints related to overdue payment of back taxes and potential risk of losing home, Limited social support and counseling for anxiety/stress related to the management of HTN, DMII, and CML and Chronic Back Pain. collaborate with the care management team towards completion of advanced directives  through collaboration with RN Care manager, provider, and care team.   Interventions: Inter-disciplinary care team collaboration (see longitudinal plan of care) Evaluation of current treatment plan related to  self management and patient's adherence to plan as established by provider Collaboration with PCP office for Tramadol refill at patient request. BSW referral for resources-completed Collaborated with BSW LCSW referral for depression/trauma-completed Collaborated with LCSW 05/24/23: Rescheduled BSW appt.  Diabetes:  (Status: Goal on Track (progressing): YES.) Lab Results  Component Value Date   HGBA1C                                                                              6.7                                    10/02/23                                                                           6.7                                   06/04/23  6.4                                   11/08/22  Assessed patient's understanding of A1c goal: <6.5% Continue to provided education to patient about basic DM disease process; Reviewed medications with patient and discussed importance of medication adherence. Counseled on importance of regular laboratory monitoring as prescribed;        Discussed plans with patient for ongoing care management follow up and provided patient with direct contact information for care management team;      Reviewed scheduled/upcoming provider appointments including: See RNCM  Clinical Goals above.       Call provider for findings outside established parameters.      Referral made to pharmacy team previously for assistance with complex medication regimen.  Patient doing well with medication compliance and his understanding of all medications.  Pharmacist referral not needed at this time.       Review of patient status, including review of consultants reports, relevant laboratory and other test results, and medications completed.  Patient understands the importance of drinking low sugar drinks and use of sugar substitutes.       Chronic Myeloid Leukemia (CML)  (Status: Goal on Track (progressing): YES.) Evaluation of current treatment plan related to  CML and patient's adherence to plan as established by provider.  Patient continues to be in remission and the plan is for patient to continue on Gleevec 400 mg daily.  Patient continues to take the Gleevec with some protein to avoid having diarrhea.  Discussed plans with patient for ongoing care management follow up and provided patient with direct contact information for care management team Reviewed medications with patient and discussed the importance of taking all medications as prescribed; Pharmacy referral for complex medication regimen made previously.  [facility] Pharmacist not needed at this time. 07/13/22: ONC appt 07/04/22-WNL  Hypertension: (Status: Goal on Track (progressing): YES.) Last practice recorded Blood Pressure readings: BP Readings from Last 3 Encounters:  06/04/23     142/80 10/14/23     103/82 03/25/23     120/73  Most recent eGFR/CrCl: No results found for: EGFR  No components found for: CRCL  Evaluation of current treatment plan related to hypertension self management and patient's adherence to plan as established by provider;   Reviewed medications with patient and discussed importance of compliance.  Discussed plans with patient for ongoing care management follow up and provided patient with direct  contact information for care management team; Discussed patient's recent blood pressure readings at recent medical office visits.  Reviewed scheduled/upcoming provider appointments i Patient did obtain a blood pressure monitor through [facility].   Continued to re-enforce education on Orthostatic blood pressure changes that occur normally when changing positions from lying to sitting to standing.  Patient continues to report he does experience some lightheaded with moving from a sitting to a standing position which only last a few seconds.  Patient checks his blood pressure and heart rate at time of episode. Patient suspects caffeine may be making heart rate elevated. Instructed patient to change positions slowly and continue to monitor. Patient verbalized understanding.  Pain:  (Status: Goal on Track (progressing): YES.) Pain assessment performed.  Patient reports chronic neck & back pain.Currently using Ibuprofen 600 mg PRN during the day and Tramadol at night with fair relief - able to sleep only 2 hours at a stretch.  Pain interferes with  sleep due to staying in same position for 2 hours.  Per patient, his pain is either triggered by staying in same position for a prolonged period of time or by moving too much. Medications reviewed.  Reviewed provider established plan for pain management; Discussed importance of adherence to all scheduled medical appointments; Patient continues to report having Neuropathy on his left side of his body (arm and leg) causing numbness and lack of coordination with movement.    Will continue to monitor.    Pain better with Qutenza patch  Patient Goals/Self-Care Activities: Patient will self administer medications as prescribed as evidenced by self report/primary caregiver report  Patient will attend all scheduled provider appointments as evidenced by clinician review of documented attendance to scheduled appointments and patient/caregiver report Patient will  call pharmacy for medication refills as evidenced by patient report and review of pharmacy fill history as appropriate Patient will continue to perform ADL's independently as evidenced by patient/caregiver report Patient will call provider office for new concerns or questions as evidenced by review of documented incoming telephone call notes and patient report Patient will work with BSW to address care coordination needs and will continue to work with the clinical team to address health care and disease management related needs as evidenced by documented adherence to scheduled care management/care coordination appointments - check blood sugar at prescribed times: twice daily - enter blood sugar readings and medication or insulin into daily log - take the blood sugar log to all doctor visits - check blood pressure weekly - write blood pressure results in a log or diary - keep a blood pressure log - take blood pressure log to all doctor appointments - call doctor for signs and symptoms of high blood pressure - keep all doctor appointments - take medications for blood pressure exactly as prescribed

## 2023-10-14 NOTE — Patient Outreach (Signed)
Medicaid Managed Care   Nurse Care Manager Note  10/14/2023 Name:  Gerald Olson MRN:  [id number] DOB:  1966/06/12  Gerald Olson is an 58 y.o. year old male who is a primary Gerald Olson of Gerald Olson, [name].  The [facility] Managed Care Coordination team was consulted for assistance with:    Chronic healthcare management needs, HTN, DM, chronic pain, anxiety/depression/ADHD, h/o leukemia, polyneuropathy, cervical/lumbar spondylosis, DLD  Gerald Olson was given information about Medicaid Managed Care Coordination team services today. Gerald Olson Gerald Olson agreed to services and verbal consent obtained.  Engaged with Gerald Olson by telephone for follow up visit in response to provider referral for case management and/or care coordination services.   Gerald Olson is participating in a Managed Medicaid Plan:  Yes  Assessments/Interventions:  Review of past medical history, allergies, medications, health status, including review of consultants reports, laboratory and other test data, was performed as part of comprehensive evaluation and provision of chronic care management services.  SDOH (Social Drivers of Health) assessments and interventions performed: SDOH Interventions    Flowsheet Row Gerald Olson Outreach Telephone from 10/14/2023 in [place] POPULATION HEALTH DEPARTMENT Gerald Olson Outreach Telephone from 08/29/2023 in [place] POPULATION HEALTH DEPARTMENT Gerald Olson Outreach Telephone from 05/24/2023 in [place] POPULATION HEALTH DEPARTMENT Gerald Olson Outreach Telephone from 03/25/2023 in [place] POPULATION HEALTH DEPARTMENT Gerald Olson Outreach Telephone from 03/18/2023 in [place] POPULATION HEALTH DEPARTMENT Gerald Olson Outreach Telephone from 02/12/2023 in [place] POPULATION HEALTH DEPARTMENT  SDOH Interventions        Food Insecurity Interventions -- Other (Comment)  [Gerald Olson has been given resources by BSW] -- -- -- --  Transportation Interventions Intervention Not Indicated -- -- -- -- Intervention Not  Indicated  Utilities Interventions -- Intervention Not Indicated -- -- -- --  Financial Strain Interventions -- -- Other (Comment)  [patiewnt has been provided with resources] -- -- --  Physical Activity Interventions -- -- -- Intervention Not Indicated  [spondylosis-unable to engage in this type of exercise] -- --  Stress Interventions -- -- -- -- [employer], Provide Counseling Offered [employer], Provide Counseling  Social Connections Interventions -- -- Intervention Not Indicated -- -- --  Health Literacy Interventions -- -- -- Intervention Not Indicated -- --     Care Plan No Known Allergies  Medications Reviewed Today     Reviewed by Danie Chandler, RN (Registered Nurse) on 10/14/23 at 1545  Med List Status: <None>   Medication Order Taking? Sig Documenting Provider Last Dose Status Informant  Blood Pressure Monitoring (BLOOD PRESSURE MONITOR AUTOMAT) DEVI [id number] No Check blood pressure in AM and PM daily  Gerald Olson not taking: Reported on 10/10/2023   Alfredia Ferguson, PA-C Not Taking Active Self  chlorhexidine (HIBICLENS) 4 % external liquid [id number] No Apply 15 mLs (1 Application total) topically as directed for 30 doses. Use as directed daily for 5 days every other week for 6 weeks.  Gerald Olson not taking: Reported on 10/10/2023   Barnett Abu, MD Not Taking Active   cyclobenzaprine (FLEXERIL) 5 MG tablet [id number] No Take 1 tablet (5 mg total) by mouth 3 (three) times daily as needed for muscle spasms.  Gerald Olson not taking: Reported on 10/10/2023   Fanny Dance, MD Not Taking Active Self  dapagliflozin propanediol (FARXIGA) 5 MG TABS tablet [id number] No Take 1 tablet (5 mg total) by mouth daily before breakfast. Carlus Pavlov, MD Taking Active Self  Gerald Olson not taking:  Discontinued 08/09/20 1309 (Gerald Olson Preference)   glucose blood (ACCU-CHEK  GUIDE) test strip [id number] No USE AS DIRECTED 2 TIMES DAILY Carlus Pavlov, MD Taking  Active Self  ibuprofen (ADVIL) 200 MG tablet [id number] No Take 800 mg by mouth every 6 (six) hours as needed for moderate pain (pain score 4-6). [provider] Taking Active Self  imatinib (GLEEVEC) 400 MG tablet [id number] No Take 1 tablet (400 mg total) by mouth daily.  Gerald Olson not taking: Reported on 09/27/2023   Rushie Chestnut, [place] Not Taking Active Self  insulin glargine (LANTUS SOLOSTAR) 100 UNIT/ML Solostar Pen [id number] No Inject 7 Units into the skin every morning. Carlus Pavlov, MD Taking Active Self  Insulin Pen Needle (TECHLITE PEN NEEDLES) 32G X 4 MM MISC [id number] No Use to inject Victoza once a day Carlus Pavlov, MD Taking Active Self  lisinopril (ZESTRIL) 20 MG tablet [id number] No Take 1 tablet (20 mg total) by mouth daily.  Gerald Olson not taking: Reported on 09/27/2023   Alfredia Ferguson, PA-C Not Taking Active Self  methocarbamol (ROBAXIN) 500 MG tablet [id number] No Take 1 tablet (500 mg total) by mouth every 6 (six) hours as needed for muscle spasms. Barnett Abu, MD Taking Active   mupirocin ointment (BACTROBAN) 2 % [id number] No Place 1 Application into the nose 2 (two) times daily for 60 doses. Use as directed 2 times daily for 5 days every other week for 6 weeks. Barnett Abu, MD Taking Active   oxyCODONE-acetaminophen (PERCOCET/ROXICET) 5-325 MG tablet [id number] No Take 1-2 tablets by mouth every 6 (six) hours as needed for severe pain (pain score 7-10). Barnett Abu, MD Taking Active   pregabalin (LYRICA) 100 MG capsule [id number] No Take 1 capsule (100 mg total) by mouth 2 (two) times daily.  Gerald Olson not taking: Reported on 09/27/2023   Fanny Dance, MD Not Taking Active Self  sodium chloride (OCEAN) 0.65 % SOLN nasal spray [id number] No Place 1 spray into both nostrils as needed for congestion.  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 08/09/2020   Mesenteric lymphadenopathy 06/14/2020   Splenomegaly- mild  06/14/2020   History of cannabis abuse 06/14/2020   Recurrent major depressive disorder, in partial remission (HCC) 06/09/2020   Anxiety 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 06/09/2020   Alcohol use disorder, moderate, dependence (HCC) 06/09/2020   Frequent falls 05/23/2020   Foraminal stenosis of cervical region 05/09/2020   Diabetic polyneuropathy    ADHD (attention deficit hyperactivity disorder)    Generalized anxiety disorder    Major depressive disorder    Cervical spondylosis 09/22/2019   Essential hypertension, benign 01/28/2019   Alcoholic cirrhosis of liver 06/02/2018   Type 2 diabetes mellitus with hyperglycemia 04/25/2018   Conditions to be addressed/monitored per PCP order:  Chronic healthcare management needs, HTN, DM, chronic pain, anxiety/depression/ADHD, h/o leukemia, polyneuropathy, cervical/lumbar spondylosis, DLD  Care Plan : RNCM Plan of Care  Updates made by Danie Chandler, RN since 10/14/2023 12:00  AM     Problem: Chronic Disease Management and Care Coordination Needs for DM, HTN, CML and Back Pain      Long-Range Goal: Plan of Care for Chronic Disease management and Care Coordination Needs (HTN, DM, CML, Chronic Back Pain)   Start Date: 05/09/2021  Expected End Date: 01/11/2024  Priority: High  Note:   Current Barriers:  Knowledge Deficits related to plan of care for management of HTN, DMII,  and CML and Chronic Back Pain  Chronic Disease Management support and education needs related to HTN, DMII, and CML and Chronic Back Pain 10/14/23: s/p laminectomy 2/10, recovering well.  BP and BG stable  RNCM Clinical Goal(s):  Gerald Olson will verbalize understanding of plan for management of HTN, DMII, and CML and Chronic Back Pain verbalize basic understanding of HTN, DMII, and CML and Chronic Back Pain disease process and self health management plan  take all medications exactly as prescribed and will call provider for medication related questions attend all scheduled medical appointments demonstrate ongoing adherence to prescribed treatment plan for HTN, DMII, and CML and Chronic Back Pain as evidenced by daily monitoring and recording of CBG, adherence to ADA/ carb modified diet, adherence to prescribed medication regimen, contacting provider for new or worsened symptoms or questions  demonstrate ongoing health management independence  continue to work with RN Care Manager to address care management and care coordination needs related to HTN, DMII, and CML and Chronic Back Pain  work with pharmacist to address complex medication regimen  related to HTN, DMII, and CML and Chronic Back Pain work with Child psychotherapist to address financial constraints related to overdue payment of back taxes and potential risk of losing home, Limited social support and counseling for anxiety/stress related to the management of HTN, DMII, and CML and Chronic Back Pain. collaborate with the care management team towards completion of advanced directives  through collaboration with RN Care manager, provider, and care team.   Interventions: Inter-disciplinary care team collaboration (see longitudinal plan of care) Evaluation of current treatment plan related to  self management and Gerald Olson's adherence to plan as established by provider Collaboration with PCP office for Tramadol refill at Gerald Olson request. BSW referral for  resources-completed Collaborated with BSW LCSW referral for depression/trauma-completed Collaborated with LCSW 05/24/23: Rescheduled BSW appt.  Diabetes:  (Status: Goal on Track (progressing): YES.) Lab Results  Component Value Date   HGBA1C                                                                              6.7                                    10/02/23                                                                           6.7  06/04/23                                                                           6.4                                   11/08/22  Assessed Gerald Olson's understanding of A1c goal: <6.5% Continue to provided education to Gerald Olson about basic DM disease process; Reviewed medications with Gerald Olson and discussed importance of medication adherence. Counseled on importance of regular laboratory monitoring as prescribed;        Discussed plans with Gerald Olson for ongoing care management follow up and provided Gerald Olson with direct contact information for care management team;      Reviewed scheduled/upcoming provider appointments including: See RNCM Clinical Goals above.       Call provider for findings outside established parameters.      Referral made to pharmacy team previously for assistance with complex medication regimen.  Gerald Olson doing well with medication compliance and his understanding of all medications.  Pharmacist referral not needed at this time.       Review of Gerald Olson status, including review of consultants reports, relevant laboratory and other test results, and medications completed.  Gerald Olson understands the importance of drinking low sugar drinks and use of sugar substitutes.       Chronic Myeloid Leukemia (CML)  (Status: Goal on Track (progressing): YES.) Evaluation of current treatment plan related to  CML and Gerald Olson's adherence to plan as established by provider.  Gerald Olson continues to be in remission and the plan is  for Gerald Olson to continue on Gleevec 400 mg daily.  Gerald Olson continues to take the Gleevec with some protein to avoid having diarrhea.  Discussed plans with Gerald Olson for ongoing care management follow up and provided Gerald Olson with direct contact information for care management team Reviewed medications with Gerald Olson and discussed the importance of taking all medications as prescribed; Pharmacy referral for complex medication regimen made previously.  [facility] Pharmacist not needed at this time. 07/13/22: ONC appt 07/04/22-WNL  Hypertension: (Status: Goal on Track (progressing): YES.) Last practice recorded Blood Pressure readings: BP Readings from Last 3 Encounters:  06/04/23     142/80 10/14/23     103/82 03/25/23     120/73  Most recent eGFR/CrCl: No results found for: EGFR  No components found for: CRCL  Evaluation of current treatment plan related to hypertension self management and Gerald Olson's adherence to plan as established by provider;   Reviewed medications with Gerald Olson and discussed importance of compliance.  Discussed plans with Gerald Olson for ongoing care management follow up and provided Gerald Olson with direct contact information for care management team; Discussed Gerald Olson's recent blood pressure readings at recent medical office visits.  Reviewed scheduled/upcoming provider appointments i Gerald Olson did obtain a blood pressure monitor through [facility].   Continued to re-enforce education on Orthostatic blood pressure changes that occur normally when changing positions from lying to sitting to standing.  Gerald Olson continues to report he does experience some lightheaded with moving from a sitting to a standing position which only last a few seconds.  Gerald Olson checks his blood pressure and heart rate at  time of episode. Gerald Olson suspects caffeine may be making heart rate elevated. Instructed Gerald Olson to change positions slowly and continue to monitor. Gerald Olson verbalized understanding.  Pain:  (Status:  Goal on Track (progressing): YES.) Pain assessment performed.  Gerald Olson reports chronic neck & back pain.Currently using Ibuprofen 600 mg PRN during the day and Tramadol at night with fair relief - able to sleep only 2 hours at a stretch.  Pain interferes with sleep due to staying in same position for 2 hours.  Per Gerald Olson, his pain is either triggered by staying in same position for a prolonged period of time or by moving too much. Medications reviewed.  Reviewed provider established plan for pain management; Discussed importance of adherence to all scheduled medical appointments; Gerald Olson continues to report having Neuropathy on his left side of his body (arm and leg) causing numbness and lack of coordination with movement.    Will continue to monitor.    Pain better with Qutenza patch  Gerald Olson Goals/Self-Care Activities: Gerald Olson will self administer medications as prescribed as evidenced by self report/primary caregiver report  Gerald Olson will attend all scheduled provider appointments as evidenced by clinician review of documented attendance to scheduled appointments and Gerald Olson/caregiver report Gerald Olson will call pharmacy for medication refills as evidenced by Gerald Olson report and review of pharmacy fill history as appropriate Gerald Olson will continue to perform ADL's independently as evidenced by Gerald Olson/caregiver report Gerald Olson will call provider office for new concerns or questions as evidenced by review of documented incoming telephone call notes and Gerald Olson report Gerald Olson will work with BSW to address care coordination needs and will continue to work with the clinical team to address health care and disease management related needs as evidenced by documented adherence to scheduled care management/care coordination appointments - check blood sugar at prescribed times: twice daily - enter blood sugar readings and medication or insulin into daily log - take the blood sugar log to all doctor visits -  check blood pressure weekly - write blood pressure results in a log or diary - keep a blood pressure log - take blood pressure log to all doctor appointments - call doctor for signs and symptoms of high blood pressure - keep all doctor appointments - take medications for blood pressure exactly as prescribed   Follow Up:  Gerald Olson agrees to Care Plan and Follow-up.  Plan: The Managed Medicaid care management team will reach out to the Gerald Olson again over the next 30 business  days. and The  Gerald Olson has been provided with contact information for the Managed Medicaid care management team and has been advised to call with any health related questions or concerns.  Date/time of next scheduled RN care management/care coordination outreach:  12/12/23 at [phone]

## 2023-10-18 ENCOUNTER — Telehealth: Payer: Medicaid Other | Admitting: Oncology

## 2023-11-20 ENCOUNTER — Encounter: Payer: Self-pay | Admitting: General Practice

## 2023-11-20 ENCOUNTER — Other Ambulatory Visit (HOSPITAL_COMMUNITY): Payer: Self-pay

## 2023-11-20 ENCOUNTER — Other Ambulatory Visit: Payer: Self-pay

## 2023-11-20 ENCOUNTER — Encounter: Payer: Self-pay | Admitting: *Deleted

## 2023-11-20 ENCOUNTER — Ambulatory Visit: Payer: Medicaid Other | Admitting: General Practice

## 2023-11-20 VITALS — BP 128/62 | HR 82 | Temp 97.8°F | Ht 74.0 in | Wt 211.0 lb

## 2023-11-20 DIAGNOSIS — E1165 Type 2 diabetes mellitus with hyperglycemia: Secondary | ICD-10-CM

## 2023-11-20 DIAGNOSIS — E1159 Type 2 diabetes mellitus with other circulatory complications: Secondary | ICD-10-CM

## 2023-11-20 DIAGNOSIS — Z794 Long term (current) use of insulin: Secondary | ICD-10-CM | POA: Diagnosis not present

## 2023-11-20 DIAGNOSIS — Z7689 Persons encountering health services in other specified circumstances: Secondary | ICD-10-CM | POA: Insufficient documentation

## 2023-11-20 DIAGNOSIS — Z139 Encounter for screening, unspecified: Secondary | ICD-10-CM | POA: Insufficient documentation

## 2023-11-20 DIAGNOSIS — E08319 Diabetes mellitus due to underlying condition with unspecified diabetic retinopathy without macular edema: Secondary | ICD-10-CM

## 2023-11-20 DIAGNOSIS — R29898 Other symptoms and signs involving the musculoskeletal system: Secondary | ICD-10-CM | POA: Diagnosis not present

## 2023-11-20 DIAGNOSIS — I152 Hypertension secondary to endocrine disorders: Secondary | ICD-10-CM | POA: Diagnosis not present

## 2023-11-20 LAB — CBC
HCT: 40.7 % (ref 39.0–52.0)
Hemoglobin: 12.7 g/dL — ABNORMAL LOW (ref 13.0–17.0)
MCHC: 31.1 g/dL (ref 30.0–36.0)
MCV: 79 fl (ref 78.0–100.0)
Platelets: 330 10*3/uL (ref 150.0–400.0)
RBC: 5.15 Mil/uL (ref 4.22–5.81)
RDW: 16 % — ABNORMAL HIGH (ref 11.5–15.5)
WBC: 5.8 10*3/uL (ref 4.0–10.5)

## 2023-11-20 LAB — COMPREHENSIVE METABOLIC PANEL
ALT: 33 U/L (ref 0–53)
AST: 38 U/L — ABNORMAL HIGH (ref 0–37)
Albumin: 3.9 g/dL (ref 3.5–5.2)
Alkaline Phosphatase: 81 U/L (ref 39–117)
BUN: 11 mg/dL (ref 6–23)
CO2: 25 meq/L (ref 19–32)
Calcium: 9 mg/dL (ref 8.4–10.5)
Chloride: 106 meq/L (ref 96–112)
Creatinine, Ser: 0.82 mg/dL (ref 0.40–1.50)
GFR: 97.69 mL/min (ref >60.00)
Glucose, Bld: 132 mg/dL — ABNORMAL HIGH (ref 70–99)
Potassium: 4.4 meq/L (ref 3.5–5.1)
Sodium: 139 meq/L (ref 135–145)
Total Bilirubin: 0.5 mg/dL (ref 0.2–1.2)
Total Protein: 6.1 g/dL (ref 6.0–8.3)

## 2023-11-20 MED ORDER — CYCLOBENZAPRINE HCL 5 MG PO TABS
5.0000 mg | ORAL_TABLET | Freq: Two times a day (BID) | ORAL | 0 refills | Status: DC | PRN
  Filled 2023-11-20: qty 20, 10d supply, fill #0

## 2023-11-20 MED ORDER — METHOCARBAMOL 500 MG PO TABS
500.0000 mg | ORAL_TABLET | Freq: Four times a day (QID) | ORAL | 0 refills | Status: DC | PRN
  Filled 2023-11-20: qty 30, 8d supply, fill #0

## 2023-11-20 NOTE — Patient Instructions (Addendum)
 Stop by the lab prior to leaving today. I will notify you of your results once received.   Schedule pneumonia vaccine and shingles vaccine at the health department.   You will either be contacted via phone regarding your referral to neurosurgery and ophthalmology, or you may receive a letter on your MyChart portal from our referral team with instructions for scheduling an appointment. Please let us know if you have not been contacted by anyone within two weeks.  Refill sent for robaxin and flexeril.  It was a pleasure meeting you!

## 2023-11-20 NOTE — Assessment & Plan Note (Signed)
 Chronic.  Due for eye exam.  Referral placed.

## 2023-11-20 NOTE — Assessment & Plan Note (Signed)
BP at goal today. Will continue to monitor

## 2023-11-20 NOTE — Assessment & Plan Note (Signed)
>>  ASSESSMENT AND PLAN FOR ESSENTIAL HYPERTENSION, BENIGN WRITTEN ON 11/20/2023 11:21 AM BY Evelene Croon, Amarra Sawyer, NP  BP at goal today.   Will continue to monitor.

## 2023-11-20 NOTE — Assessment & Plan Note (Addendum)
 Unclear etiology. Neuroexam stable other than limited ROM in right arm.  Right arm grip and strength decreased on exam.  He already has an appointment scheduled with neurosurgery in April.  Referral placed. Per patient he has already discussed this with his neurosurgeon and he plans to do the imaging and tests for him after his appointment.  Will not order any today. As cyclobenzaprine and methocarbamol has been helping refill sent.  Will continue to monitor.  ER precautions discussed.

## 2023-11-20 NOTE — Assessment & Plan Note (Signed)
 EMR reviewed briefly.

## 2023-11-20 NOTE — Assessment & Plan Note (Signed)
 Followed by endocrinology.  Last A1c was 6.7 on 10/02/2023.  Currently on Lantus 7 units daily and Farxiga 5 mg once daily. Has follow-up with endocrinology next week. Referral placed for diabetic eye exam. Will complete foot exam at next visit if it is not completed by endocrinology next week. Urine ACR up-to-date. CMP pending.

## 2023-11-20 NOTE — Progress Notes (Signed)
 New Patient Office Visit  Subjective    Patient ID: Gerald Olson, male    DOB: 16-May-1966  Age: 58 y.o. MRN: [id number]  CC:  Chief Complaint  Patient presents with   New Patient (Initial Visit)   Referral    Patient needs referral to Dr. Danielle Dess at [facility]. Patient has no function of right arm.     HPI Gerald Olson is a 58 y.o. male presents to establish care.  Last PCP- [facility].   Right arm weakness: symptom onset February, a week before his back surgery. He reports that the pain started in the neck/scapula which then radiating to the right arm to his finger. Pain is sharp shooting under the scapula, dull and achy in the arm. He reports that he has lost full mobility, he can't grasp anything with that hand, he is not able to write anything. He has a neurologist but he needed a referral from primary care. He does have a lot of in the right arm and he has not been able to sleep. He has been Robaxin 500 mg PRN and flexeril 5 mg as needed. He has also been using a home remedy as well. He had minimal relief.   Type 2 DM- goes to endocrinology in [place]. Currently managed on Lantus 7 units once daily and farxiga 5 mg once daily. His last A1c was 6.7 on 10/02/23. 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 He does not go to the eye doctor. He has a follow up with endocrinologist in April.    Outpatient Encounter Medications as of 11/20/2023  Medication Sig   dapagliflozin propanediol (FARXIGA) 5 MG TABS tablet Take 1 tablet (5 mg total) by mouth daily before breakfast.   glucose blood (ACCU-CHEK GUIDE) test strip USE AS DIRECTED 2 TIMES DAILY   ibuprofen (ADVIL) 200 MG tablet Take 1,000 mg by mouth every 6 (six) hours as needed for moderate pain (pain score 4-6).   insulin glargine (LANTUS SOLOSTAR) 100 UNIT/ML Solostar Pen Inject 7 Units into the skin every morning.   methocarbamol (ROBAXIN) 500 MG tablet Take 1 tablet (500 mg total) by mouth every 6 (six)  hours as needed for muscle spasms.   pregabalin (LYRICA) 50 MG capsule Take 50 mg by mouth 3 (three) times daily.   cyclobenzaprine (FLEXERIL) 5 MG tablet Take 1 tablet (5 mg total) by mouth 2 (two) times daily as needed for muscle spasms.   oxyCODONE-acetaminophen (PERCOCET/ROXICET) 5-325 MG tablet Take 1-2 tablets by mouth every 6 (six) hours as needed for severe pain (pain score 7-10). (Patient not taking: Reported on 11/20/2023)   [DISCONTINUED] Blood Pressure Monitoring (BLOOD PRESSURE MONITOR AUTOMAT) DEVI Check blood pressure in AM and PM daily (Patient not taking: Reported on 10/10/2023)   [DISCONTINUED] chlorhexidine (HIBICLENS) 4 % external liquid Apply 15 mLs (1 Application total) topically as directed for 30 doses. Use as directed daily for 5 days every other week for 6 weeks. (Patient not taking: Reported on 10/10/2023)   [DISCONTINUED] cyclobenzaprine (FLEXERIL) 5 MG tablet Take 1 tablet (5 mg total) by mouth 3 (three) times daily as needed for muscle spasms. (Patient not taking: Reported on 10/10/2023)   [DISCONTINUED] DULoxetine (CYMBALTA) 60 MG capsule Take 1 capsule (60 mg total) by mouth 2 (two) times daily. (Patient not taking: Reported on 08/09/2020)   [DISCONTINUED] imatinib (GLEEVEC) 400 MG tablet Take 1 tablet (400 mg total) by mouth daily. (Patient not taking: Reported on 09/27/2023)   [DISCONTINUED] lisinopril (  ZESTRIL) 20 MG tablet Take 1 tablet (20 mg total) by mouth daily. (Patient not taking: Reported on 09/27/2023)   [DISCONTINUED] methocarbamol (ROBAXIN) 500 MG tablet Take 1 tablet (500 mg total) by mouth every 6 (six) hours as needed for muscle spasms.   [DISCONTINUED] pregabalin (LYRICA) 100 MG capsule Take 1 capsule (100 mg total) by mouth 2 (two) times daily. (Patient not taking: Reported on 09/27/2023)   [DISCONTINUED] sodium chloride (OCEAN) 0.65 % SOLN nasal spray Place 1 spray into both nostrils as needed for congestion. (Patient not taking: Reported on 10/10/2023)   No  facility-administered encounter medications on file as of 11/20/2023.    Past Medical History:  Diagnosis Date   ADHD (attention deficit hyperactivity disorder)    Diagnosed during teenage years   Alcohol use disorder    On average 14-21 drinks per week   Alcoholic cirrhosis of liver    Per patient - condition is not related to moderate alcohol use but rather prolonged NSAID use and recent abuse.   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[place]   Major depressive disorder    Other spondylosis with radiculopathy, cervical region    Retinopathy    Right eye; per patient report   Type 2 diabetes mellitus with hyperglycemia     Past Surgical History:  Procedure Laterality Date   CHOLECYSTECTOMY N/A 06/02/2018   Procedure: LAPAROSCOPIC CHOLECYSTECTOMY;  Surgeon: Ancil Linsey, MD;  Location: [facility] ORS;  Service: General;  Laterality: N/A;   KNEE ARTHROSCOPY Right    30 Years ago   lumbar five-sacral one posterior lumbar interbody fusion  01/24/2021   LUMBAR LAMINECTOMY/DECOMPRESSION MICRODISCECTOMY Bilateral 10/07/2023   Procedure: Laminectomy for Facet/Synovial Cyst - Lumbar Four-Lumbar Five - Bilateral;  Surgeon: Barnett Abu, MD;  Location: [facility] OR;  Service: Neurosurgery;  Laterality: Bilateral;  C3    Family History  Problem Relation Age of Onset   Aneurysm Father    Drug abuse Sister    Multiple sclerosis Sister    Suicidality Maternal Aunt    Emphysema Maternal Grandfather    Diabetes Neg Hx     Social History   Socioeconomic History   Marital status: Single     Spouse name: Not on file   Number of children: 0   Years of education: 24   Highest education level: Master's degree (e.g., MA, MS, MEng, MEd, MSW, MBA)  Occupational History   Not on file  Tobacco Use   Smoking status: Former    Current packs/day: 0.00    Types: Cigarettes    Quit date: 06/25/2003    Years since quitting: 20.4   Smokeless tobacco: Never   Tobacco comments:    Currently vapes  Vaping Use   Vaping status: Every Day   Substances: CBD  Substance and Sexual Activity   Alcohol use: Yes    Alcohol/week: 21.0 - 42.0 standard drinks of alcohol    Types: 21 - 42 Cans of beer per week    Comment: 2-3 beers per night  on average; sometimes up to 6   Drug use: Yes    Types: Marijuana    Comment: twice/week to help sleep; edibles, does not smoke   Sexual activity: Yes  Other Topics Concern   Not on file  Social History Narrative   Not on file   Social Drivers of Health   Financial Resource Strain: High Risk (05/24/2023)   Overall Financial Resource Strain (CARDIA)    Difficulty of Paying Living Expenses: Very hard  Food Insecurity: Food Insecurity Present (08/29/2023)   Hunger Vital Sign    Worried About Running Out of Food in the Last Year: Sometimes true    Ran Out of Food in the Last Year: Sometimes true  Transportation Needs: No Transportation Needs (10/14/2023)   PRAPARE - Administrator, Civil Service (Medical): No    Lack of Transportation (Non-Medical): No  Physical Activity: Insufficiently Active (03/25/2023)   Exercise Vital Sign    Days of Exercise per Week: 3 days    Minutes of Exercise per Session: 10 min  Stress: Stress Concern Present (03/18/2023)   [employer] of Occupational Health - Occupational Stress Questionnaire    Feeling of Stress : To some extent  Social Connections: Socially Isolated (05/24/2023)   Social Connection and Isolation Panel [NHANES]    Frequency of Communication with Friends and Family: More than three times  a week    Frequency of Social Gatherings with Friends and Family: More than three times a week    Attends Religious Services: Never    Database administrator or Organizations: No    Attends Banker Meetings: Never    Marital Status: Never married  Intimate Partner Violence: Not At Risk (10/14/2023)   Humiliation, Afraid, Rape, and Kick questionnaire    Fear of Current or Ex-Partner: No    Emotionally Abused: No    Physically Abused: No    Sexually Abused: No    Review of Systems  Constitutional:  Negative for chills and fever.  Respiratory:  Negative for shortness of breath.   Cardiovascular:  Negative for chest pain.  Gastrointestinal:  Negative for abdominal pain, constipation, diarrhea, heartburn, nausea and vomiting.  Genitourinary:  Negative for dysuria, frequency and urgency.  Musculoskeletal:  Positive for joint pain.       Right arm pain and weakness  Neurological:  Positive for weakness. Negative for dizziness and headaches.       Right arm weakness  Endo/Heme/Allergies:  Negative for polydipsia.  Psychiatric/Behavioral:  Negative for depression and suicidal ideas. The patient is not nervous/anxious.         Objective    BP 128/62 (BP Location: Left Arm, Patient Position: Sitting, Cuff Size: Normal)   Pulse 82   Temp 97.8 F (36.6 C) (Oral)   Ht 6\' 2"  (1.88 m)   Wt 211 lb (95.7 kg)   SpO2 98%   BMI 27.09 kg/m   Physical Exam Vitals and nursing note reviewed.  Constitutional:      Appearance: Normal appearance.  Cardiovascular:     Rate and Rhythm: Normal rate and regular rhythm.     Pulses: Normal pulses.     Heart sounds: Normal heart sounds.  Pulmonary:     Effort: Pulmonary effort is normal.     Breath sounds: Normal breath sounds.  Musculoskeletal:     Right upper arm: No swelling, edema, deformity, lacerations, tenderness or bony tenderness.     Left upper arm: Normal.  Right elbow: No swelling, deformity, effusion or lacerations.  Decreased range of motion.     Left elbow: Normal.     Left forearm: Normal.     Right wrist: Decreased range of motion.     Left wrist: Normal.     Right hand: Decreased range of motion. Decreased strength.     Left hand: Normal.  Neurological:     Mental Status: He is alert and oriented to person, place, and time.     Motor: Weakness present.     Comments: Right arm weakness  Psychiatric:        Mood and Affect: Mood normal.        Behavior: Behavior normal.        Thought Content: Thought content normal.        Judgment: Judgment normal.         Assessment & Plan:  Right arm weakness Assessment & Plan: Unclear etiology. Neuroexam stable other than limited ROM in right arm.  Right arm grip and strength decreased on exam.  He already has an appointment scheduled with neurosurgery in April.  Referral placed. Per patient he has already discussed this with his neurosurgeon and he plans to do the imaging and tests for him after his appointment.  Will not order any today. As cyclobenzaprine and methocarbamol has been helping refill sent.  Will continue to monitor.  ER precautions discussed.  Orders: -     Ambulatory referral to Neurosurgery -     Cyclobenzaprine HCl; Take 1 tablet (5 mg total) by mouth 2 (two) times daily as needed for muscle spasms.  Dispense: 20 tablet; Refill: 0 -     Methocarbamol; Take 1 tablet (500 mg total) by mouth every 6 (six) hours as needed for muscle spasms.  Dispense: 30 tablet; Refill: 0  Establishing care with new doctor, encounter for Assessment & Plan: EMR reviewed briefly.   Uncontrolled type 2 diabetes mellitus with hyperglycemia [facility]) Assessment & Plan: Followed by endocrinology.  Last A1c was 6.7 on 10/02/2023.  Currently on Lantus 7 units daily and Farxiga 5 mg once daily. Has follow-up with endocrinology next week. Referral placed for diabetic eye exam. Will complete foot exam at next visit if it is not completed by endocrinology  next week. Urine ACR up-to-date. CMP pending.   Hypertension associated with diabetes (HCC) Assessment & Plan:  >>ASSESSMENT AND PLAN FOR ESSENTIAL HYPERTENSION, BENIGN WRITTEN ON 11/20/2023 11:21 AM BY Evelene Croon, Nicklos Gaxiola, NP  BP at goal today.   Will continue to monitor.  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  Due for eye exam.  Referral placed.  Orders: -     Ambulatory referral to Ophthalmology -     Comprehensive metabolic panel -     CBC  Encounter for screening involving social determinants of health (SDoH) -     AMB Referral [facility] Care Management   Return in about 4 weeks (around 12/18/2023) for health maintainence.   [place] Charon, NP

## 2023-11-22 ENCOUNTER — Telehealth: Payer: Self-pay | Admitting: *Deleted

## 2023-11-22 NOTE — Progress Notes (Signed)
 Complex Care Management Note Care Guide Note  11/22/2023 Name: Gerald Olson MRN: [id number] DOB: 08-29-1965   Complex Care Management Outreach Attempts: An unsuccessful telephone outreach was attempted today to offer the patient information about available complex care management services.  Follow Up Plan:  Additional outreach attempts will be made to offer the patient complex care management information and services.   Encounter Outcome:  No Answer  [facility] Health Care Guide  Direct Dial:[phone] [phone] Website: [email or web address]

## 2023-11-25 ENCOUNTER — Telehealth: Payer: Self-pay | Admitting: *Deleted

## 2023-11-25 NOTE — Progress Notes (Signed)
 Complex Care Management Note Care Guide Note  11/25/2023 Name: Gerald Olson MRN: [id number] DOB: June 09, 1966  [name] is a 58 y.o. year old male who is a primary care patient of [place] Charon, NP . The community resource team was consulted for assistance with Food Insecurity  SDOH screenings and interventions completed:  Yes  Social Drivers of Health From This Encounter   Food Insecurity: Food Insecurity Present (11/25/2023)   Hunger Vital Sign    Worried About Running Out of Food in the Last Year: Sometimes true    Ran Out of Food in the Last Year: Sometimes true  Housing: Unknown (11/25/2023)   Housing Stability Vital Sign    Unable to Pay for Housing in the Last Year: No    Homeless in the Last Year: No  Financial Resource Strain: Medium Risk (11/25/2023)   Overall Financial Resource Strain (CARDIA)    Difficulty of Paying Living Expenses: Somewhat hard    SDOH Interventions Today    Flowsheet Row Most Recent Value  SDOH Interventions   Food Insecurity Interventions Community Resources Provided  [Provided food banks]  Housing Interventions Intervention Not Indicated  [Lost his house , but can stay there for the time being]  Financial Strain Interventions Community Resources Provided  [hard limited icome but not willing to move as he is not good with people]        Care guide performed the following interventions:   Follow Up Plan:  No further follow up planned at this time. The patient has been provided with needed resources.  Encounter Outcome:  Patient Visit Completed Dione Booze  [facility] Health Care Guide  Direct Dial:[phone] Fax:[phone] Website: [email or web address]

## 2023-12-03 ENCOUNTER — Encounter: Payer: Self-pay | Admitting: Internal Medicine

## 2023-12-03 ENCOUNTER — Ambulatory Visit: Payer: Medicaid Other | Admitting: Internal Medicine

## 2023-12-03 VITALS — BP 138/86 | HR 97 | Ht 74.0 in | Wt 217.6 lb

## 2023-12-03 DIAGNOSIS — E1165 Type 2 diabetes mellitus with hyperglycemia: Secondary | ICD-10-CM

## 2023-12-03 DIAGNOSIS — E785 Hyperlipidemia, unspecified: Secondary | ICD-10-CM | POA: Diagnosis not present

## 2023-12-03 DIAGNOSIS — Z7984 Long term (current) use of oral hypoglycemic drugs: Secondary | ICD-10-CM

## 2023-12-03 DIAGNOSIS — Z794 Long term (current) use of insulin: Secondary | ICD-10-CM | POA: Diagnosis not present

## 2023-12-03 LAB — POCT GLYCOSYLATED HEMOGLOBIN (HGB A1C): Hemoglobin A1C: 6.2 % — AB (ref 4.0–5.6)

## 2023-12-03 NOTE — Progress Notes (Signed)
 Patient ID: Gerald Olson, male   DOB: 09-07-1965, 58 y.o.   MRN: [id number]  HPI: Gerald Olson is a 58 y.o.-year-old male, returning for follow-up for DM2 dx in 2019, with prediabetes 2006, insulin-dependent since 2020, uncontrolled, with complications (peripheral artery disease, diabetic retinopathy, peripheral neuropathy). Pt. previously saw Dr. Everardo All, but last visit with me 6 months ago.  Interim history: No increased urination, blurry vision, nausea, chest pain. He has back pain-seen in the pain clinic. He also has numbness in R arm.  Reviewed HbA1c: Lab Results  Component Value Date   HGBA1C 6.7 (H) 10/02/2023   HGBA1C 6.7 (A) 06/04/2023   HGBA1C 6.4 (A) 11/08/2022   HGBA1C 7.4 (A) 08/09/2022   HGBA1C 6.6 (A) 04/04/2022   HGBA1C 6.1 (A) 01/16/2022   HGBA1C 6.0 (A) 12/26/2021   HGBA1C 6.0 (A) 09/22/2021   HGBA1C 5.9 (A) 06/19/2021   HGBA1C 5.9 (A) 04/19/2021   Pt is on a regimen of: - Lantus 7 units in  am 6/7 days >> daily >> off >> restarted  - Farxiga 5 mg daily in a.m.-restarted 07/2022  Pt checks his blood sugars 1-2 a day - am: 192, 110-130, 170-180 >> 108, 130-171, 221 (white bread) >> 107-171 - 2h after b'fast: n/c - before lunch: n/c - 2h after lunch: n/c >> 115, 226 - before dinner: 160-190 >> n/c >> 130-140, 150 >> 102 - 2h after dinner: n/c - bedtime: n/c - nighttime: n/c Lowest sugar was 110 >> 92 >> 108 >> 99; he has hypoglycemia awareness at 90.  Highest sugar was 190 >> 181 >> 221 >> 226.  Glucometer: Accu-Chek guide  Pt's meals are: - Breakfast: none - Lunch: none - Dinner: canned soup, hamburger helper or other meat, rice, beans, corn - Snacks: tea with stevia  - + CKD - saw [facility] but not anymore, last BUN/creatinine:  Lab Results  Component Value Date   BUN 11 11/20/2023   BUN 15 10/04/2023   CREATININE 0.82 11/20/2023   CREATININE 0.79 10/04/2023   Lab Results  Component Value Date   MICRALBCREAT 1.1 06/04/2023    MICRALBCREAT 4 01/16/2022   MICRALBCREAT 11.6 08/06/2018  07/13/2021: 0.407 (0.025 - 0.148 mg/mg creat)  -+ Dyslipidemia; last set of lipids: Lab Results  Component Value Date   CHOL 101 10/04/2022   HDL 29 (L) 10/04/2022   LDLCALC 56 10/04/2022   TRIG 78 10/04/2022   CHOLHDL 3.5 10/04/2022  He is not on a statin.  - last eye exam was on 05/2022. + NPDR OU, without macular edema.  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 Coming up.  - + numbness and tingling in his feet.  Last foot exam 11/08/2022. He is taking collagen + peptides + retinol. He also takes B vitamins with Benfotiamine for neuropathy.  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  ROS: + see HPI  Past Medical History:  Diagnosis Date   ADHD (attention deficit hyperactivity disorder)    Diagnosed during teenage years   Alcohol use disorder    On average 14-21 drinks per week   Alcoholic cirrhosis of liver    Per patient - condition is not related to moderate alcohol use but rather prolonged NSAID use and recent abuse.   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[place]   Major depressive disorder    Other spondylosis with radiculopathy, cervical region    Retinopathy    Right eye; per patient report   Type 2 diabetes mellitus with hyperglycemia    Past Surgical History:  Procedure Laterality Date   CHOLECYSTECTOMY N/A 06/02/2018   Procedure: LAPAROSCOPIC CHOLECYSTECTOMY;  Surgeon: Ancil Linsey, MD;  Location: [facility] ORS;  Service: General;  Laterality: N/A;   KNEE ARTHROSCOPY Right    30 Years ago   lumbar five-sacral one posterior lumbar interbody fusion  01/24/2021   LUMBAR LAMINECTOMY/DECOMPRESSION MICRODISCECTOMY Bilateral 10/07/2023   Procedure: Laminectomy for Facet/Synovial Cyst - Lumbar Four-Lumbar Five - Bilateral;  Surgeon: Barnett Abu, MD;  Location: [facility] OR;  Service: Neurosurgery;  Laterality: Bilateral;  C3   Social History   Socioeconomic History   Marital status: Single    Spouse name: Not on file   Number of children: 0   Years of education: 1   Highest education level: Master's degree (e.g., MA, MS, MEng, MEd, MSW, MBA)  Occupational History   Not on file  Tobacco Use   Smoking status: Former    Current packs/day: 0.00    Types: Cigarettes    Quit date: 06/25/2003    Years since quitting: 20.4   Smokeless tobacco: Never   Tobacco comments:    Currently vapes  Vaping Use   Vaping status: Every Day   Substances: CBD  Substance and Sexual Activity   Alcohol use: Yes    Alcohol/week: 21.0 - 42.0 standard drinks of alcohol    Types: 21 - 42 Cans of beer per week    Comment: 2-3 beers per night on average; sometimes up to 6   Drug use: Yes    Types: Marijuana    Comment: twice/week to help sleep; edibles, does not smoke   Sexual activity: Yes  Other Topics Concern   Not on file  Social History Narrative   Not on file   Social Drivers of Health   Financial Resource Strain: Medium Risk (11/25/2023)   Overall Financial Resource Strain (CARDIA)    Difficulty of Paying Living Expenses: Somewhat hard  Food Insecurity: Food Insecurity Present (11/25/2023)   Hunger Vital Sign    Worried About Running Out of Food in the Last Year: Sometimes true    Ran Out of Food in the Last Year: Sometimes true  Transportation Needs: No Transportation Needs (10/14/2023)   PRAPARE - Administrator, Civil Service (Medical): No    Lack of Transportation  (Non-Medical): No  Physical Activity: Insufficiently Active (03/25/2023)   Exercise Vital Sign    Days of Exercise per Week: 3 days    Minutes of Exercise per Session: 10 min  Stress: Stress Concern Present (03/18/2023)   [employer] of Occupational Health - Occupational Stress Questionnaire    Feeling of Stress : To some extent  Social Connections: Socially Isolated (05/24/2023)   Social Connection and Isolation Panel [NHANES]    Frequency of Communication with Friends and Family: More than three times a week    Frequency of Social Gatherings with Friends and Family: More than  three times a week    Attends Religious Services: Never    Active Member of Clubs or Organizations: No    Attends Banker Meetings: Never    Marital Status: Never married  Intimate Partner Violence: Not At Risk (10/14/2023)   Humiliation, Afraid, Rape, and Kick questionnaire    Fear of Current or Ex-Partner: No    Emotionally Abused: No    Physically Abused: No    Sexually Abused: No   Current Outpatient Medications on File Prior to Visit  Medication Sig Dispense Refill   cyclobenzaprine (FLEXERIL) 5 MG tablet Take 1 tablet (5 mg total) by mouth 2 (two) times daily as needed for muscle spasms. 20 tablet 0   dapagliflozin propanediol (FARXIGA) 5 MG TABS tablet Take 1 tablet (5 mg total) by mouth daily before breakfast. 90 tablet 3   glucose blood (ACCU-CHEK GUIDE) test strip USE AS DIRECTED 2 TIMES DAILY 180 strip 0   ibuprofen (ADVIL) 200 MG tablet Take 1,000 mg by mouth every 6 (six) hours as needed for moderate pain (pain score 4-6).     insulin glargine (LANTUS SOLOSTAR) 100 UNIT/ML Solostar Pen Inject 7 Units into the skin every morning. 15 mL 3   methocarbamol (ROBAXIN) 500 MG tablet Take 1 tablet (500 mg total) by mouth every 6 (six) hours as needed for muscle spasms. 30 tablet 0   oxyCODONE-acetaminophen (PERCOCET/ROXICET) 5-325 MG tablet Take 1-2 tablets by mouth every 6 (six) hours as  needed for severe pain (pain score 7-10). (Patient not taking: Reported on 11/20/2023) 40 tablet 0   pregabalin (LYRICA) 50 MG capsule Take 50 mg by mouth 3 (three) times daily.     No current facility-administered medications on file prior to visit.   No Known Allergies Family History  Problem Relation Age of Onset   Aneurysm Father    Drug abuse Sister    Multiple sclerosis Sister    Suicidality Maternal Aunt    Emphysema Maternal Grandfather    Diabetes Neg Hx    PE: BP 138/86   Pulse 97   Ht 6\' 2"  (1.88 m)   Wt 217 lb 9.6 oz (98.7 kg)   SpO2 97%   BMI 27.94 kg/m  Wt Readings from Last 10 Encounters:  12/03/23 217 lb 9.6 oz (98.7 kg)  11/20/23 211 lb (95.7 kg)  10/07/23 224 lb (101.6 kg)  10/02/23 226 lb 9.6 oz (102.8 kg)  06/04/23 233 lb 3.2 oz (105.8 kg)  05/30/23 233 lb 12.8 oz (106.1 kg)  03/11/23 232 lb (105.2 kg)  11/08/22 238 lb (108 kg)  11/02/22 235 lb (106.6 kg)  09/21/22 244 lb 9.6 oz (110.9 kg)   Constitutional: overweight, in NAD Eyes: no exophthalmos ENT: no thyromegaly, no cervical lymphadenopathy Cardiovascular: Tachycardia, RR, No MRG Respiratory: CTA B Musculoskeletal: no deformities Skin: no rashes Neurological: no tremor with outstretched hands Diabetic Foot Exam - Simple   Simple Foot Form Diabetic Foot exam was performed with the following findings: Yes 12/03/2023  3:26 PM  Visual Inspection No deformities, no ulcerations, no other skin breakdown bilaterally: Yes Sensation Testing See comments: Yes Pulse Check Posterior Tibialis and Dorsalis pulse intact bilaterally: Yes Comments Decreased sensation in B feet.    ASSESSMENT: 1. DM2, insulin-dependent, uncontrolled, with complications - PAD - DR - PN  2.  Dyslipidemia  PLAN:  1. Patient with longstanding, uncontrolled, type 2 diabetes, on oral antidiabetic regimen with SGLT2 inhibitor and also low-dose basal insulin.  At last visit, he was  off Lantus after running out 2 weeks prior  to the visit but previously spacing it out and only taking it 3 days a week.  Sugars were higher than target in the morning and occasionally later in the day.  Upon questioning, he was taking Lantus on an as needed basis, only when sugars are higher in the morning.  I advised him to continue to take it every day and also to continue [place] daily.  HbA1c at that time was higher, at 6.7%.  He had another HbA1c 2 months ago which was stable. - Since last visit, he lost 16 lbs. Marcelline Deist may have helped.  Will continue this.  He also mentions he is not taking Lantus consistently.  Sugars are at or slightly above target.  Due to the improvement in the HbA1c, for now I advised him to continue the same regimen. - I suggested to:  Patient Instructions  Please continue: - Farxiga 5 mg daily in am - Lantus 7 units daily in am  Please return in 6 months with your sugar log.   - we checked his HbA1c: 6.2% (lower) - advised to check sugars at different times of the day - 1x a day, rotating check times - advised for yearly eye exams >> he is UTD - return to clinic in 6 months  2.  Dyslipidemia - Latest lipid panel was reviewed from 09/2022: Fractions at goal with the exception of the low HDL: Lab Results  Component Value Date   CHOL 101 10/04/2022   HDL 29 (L) 10/04/2022   LDLCALC 56 10/04/2022   TRIG 78 10/04/2022   CHOLHDL 3.5 10/04/2022  -He is not on a statin -He is due for another lipid panel -he has another appointment with PCP towards the end of the month for the rest of the labs  Carlus Pavlov, MD PhD [facility]

## 2023-12-03 NOTE — Patient Instructions (Addendum)
 Please continue: - Farxiga 5 mg daily in am - Lantus 7 units daily in am  Please return in 6 months with your sugar log.

## 2023-12-04 ENCOUNTER — Encounter: Payer: Self-pay | Admitting: Internal Medicine

## 2023-12-04 LAB — MICROALBUMIN / CREATININE URINE RATIO
Creatinine, Urine: 105 mg/dL (ref 20–320)
Microalb, Ur: <0.2 mg/dL

## 2023-12-06 LAB — HM DIABETES EYE EXAM

## 2023-12-11 DIAGNOSIS — M4317 Spondylolisthesis, lumbosacral region: Secondary | ICD-10-CM | POA: Diagnosis not present

## 2023-12-11 DIAGNOSIS — Z6826 Body mass index (BMI) 26.0-26.9, adult: Secondary | ICD-10-CM | POA: Diagnosis not present

## 2023-12-11 DIAGNOSIS — M5412 Radiculopathy, cervical region: Secondary | ICD-10-CM | POA: Diagnosis not present

## 2023-12-12 ENCOUNTER — Ambulatory Visit: Payer: Self-pay | Admitting: Obstetrics and Gynecology

## 2023-12-16 ENCOUNTER — Ambulatory Visit: Payer: Self-pay

## 2023-12-16 ENCOUNTER — Other Ambulatory Visit: Payer: Self-pay

## 2023-12-17 NOTE — Patient Outreach (Unsigned)
 Complex Care Management   Visit Note  12/17/2023  Name:  Gerald Olson MRN: [id number] DOB: 1965-10-12  Situation: Referral received for Complex Care Management related to  diabetes  I obtained verbal consent from Patient.  Visit completed with patient  on the phone  Background:   Past Medical History:  Diagnosis Date   ADHD (attention deficit hyperactivity disorder)    Diagnosed during teenage years   Alcohol use disorder    On average 14-21 drinks per week   Alcoholic cirrhosis of liver    Per patient - condition is not related to moderate alcohol use but rather prolonged NSAID use and recent abuse.   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  He feels this is due to sleep depravation.  Patient states he does not sleep well at all. Agreeable to social work referral.) Cognitive/Intellectual Conditions Management [RPT]: None reported or documented in medical history or problem list   Health Maintenance Behaviors: Annual physical exam Healing Pattern: Average   Neurological Neurological Review of Symptoms: Numbness (patient reports numbness/ tingling and pain in right arm. He reports weakness and no function of rt. Arm. Patient states he is being seen by the neurosurgeon and waiting for approval to have a MRI by his Medicaid insurance.) Neurological Conditions:  (patient reports having back surgery/ laminectiomy 09/2023) Neurological Management Strategies: Medication therapy, Routine screening Neurological Self-Management Outcome: 3 (uncertain)  HEENT HEENT Symptoms Reported: Runny nose, Other: (sinus burning in eyes.  Patient states he contributes these symptoms to seasonal allergies.) HEENT Conditions:  (patient reports having seasonal allergies.) HEENT Management Strategies: Routine screening HEENT Self-Management Outcome: 4 (good)  (patient reports having seasonal allergies.)  Cardiovascular Cardiovascular Symptoms Reported: No symptoms reported Weight: 212 lb (96.2 kg)  Respiratory Respiratory Symptoms Reported: No symptoms reported    Endocrine Patient reports the following symptoms related to hypoglycemia or hyperglycemia : No symptoms reported Is patient diabetic?: Yes Is patient checking blood sugars at home?: Yes Endocrine Conditions: Diabetes Endocrine Management Strategies: Routine screening, Medication therapy, Diet modification Endocrine Self-Management Outcome: 4 (good) Endocrine Comment: Patient states he eats 1 time per day.  He states he doesn't eat sugar, stays away from pastas, eats whole grain and occasional potatos and protein.  Patient states he does have concerns about food insecurity and recently spoke with community resource guide for food resources.  Gastrointestinal Gastrointestinal Symptoms Reported: Constipation Gastrointestinal Conditions: Constipation (patient reports occasional constipation. States he uses metamucil to manage.) Gastrointestinal Management Strategies: Medication therapy (OTC) Gastrointestinal  Self-Management Outcome: 4 (good) Nutrition Risk Screen (CP): No indicators present  Genitourinary Genitourinary Symptoms Reported: No symptoms reported    Integumentary Integumentary Symptoms Reported: Skin changes Additional Integumentary Details: Patient reports some bruising on his LE due to recent "9 foot fall" when walking his dog. Patient denies any open cuts/ wounds or head injury. He states he has not notified his provider or neurosurgeon about the recent fall. Skin Management Strategies:  (none verbalized per patient.) Skin Self-Management Outcome: 3 (uncertain) Skin Comment: patient advised to contact the neurosugeon's office due to recent fall or call primary care provider if unable to be seen by neurosurgeon.  Musculoskeletal Musculoskelatal Symptoms Reviewed: Difficulty walking Additional Musculoskeletal Details: reports using cane for ambulation Musculoskeletal Conditions: Back pain, Other Other Musculoskeletal Conditions: right arm pain, numbness/ tingling Musculoskeletal Management Strategies: Routine screening, Medication therapy Musculoskeletal Self-Management Outcome: 3 (uncertain) Falls in the past year?: Yes Number of falls in past year: 2 or more Was there an injury with Fall?: No (patient unsure other than bursing to LE. He reports having recent laminectomy 09/2023.  He states initially after fall he was sore however states now in last day or so he has felt a " shock"  feeling in his feet. Patient reports sustaining fall on 12/12/23) Fall Risk Category Calculator: 2 Patient Fall Risk Level: Moderate Fall Risk Patient at Risk for Falls Due to: History of fall(s) Fall risk Follow up: Falls prevention discussed  Psychosocial Psychosocial Symptoms Reported: Anxiety - if selected complete GAD Behavioral Health Conditions: Anxiety Behavioral Management Strategies:  (denies any specific treatment at this time.  Patient offered referral to social worker for follow up. Patient  verbally agreed.) Behavioral Health Self-Management Outcome: 3 (uncertain) Major Change/Loss/Stressor/Fears (CP): Medical condition, self, Resources Techniques to [place] with Loss/Stress/Change: Diversional activities Quality of Family Relationships: supportive Do you feel physically threatened by others?: No      11/20/2023   10:23 AM  Depression screen PHQ 2/9  Decreased Interest 3  Down, Depressed, Hopeless 3  PHQ - 2 Score 6  Altered sleeping 3  Tired, decreased energy 3  Change in appetite 3  Feeling bad or failure about yourself  3  Trouble concentrating 3  Moving slowly or fidgety/restless 0  Suicidal thoughts 0  PHQ-9 Score 21  Difficult doing work/chores Very difficult    There were no vitals filed for this visit.  Medications Reviewed Today     Reviewed by Euretha Najarro E, RN (Registered Nurse) on 12/16/23 at 1547  Med List Status: <None>   Medication Order Taking? Sig Documenting Provider Last Dose Status Informant  cyclobenzaprine  (FLEXERIL ) 5 MG tablet [id number] Yes Take 1 tablet (5 mg total) by mouth 2 (two) times daily as needed for muscle spasms. Jolanda Nation, NP Taking Active   dapagliflozin  propanediol (FARXIGA ) 5 MG TABS tablet [id number] Yes Take 1 tablet (5 mg total) by mouth daily before breakfast. Emilie Harden, MD Taking Active Self  glucose blood (ACCU-CHEK GUIDE) test strip [id number]  USE AS DIRECTED 2 TIMES DAILY Emilie Harden, MD  Active Self  ibuprofen  (ADVIL ) 200 MG tablet [id number] Yes Take 1,000 mg by mouth every 6 (six) hours as needed for moderate pain (pain score 4-6). [provider] Taking Active Self  insulin  glargine (LANTUS  SOLOSTAR) 100 UNIT/ML Solostar Pen [id number] Yes Inject 7 Units into the skin every morning. Emilie Harden, MD Taking Active Self  methocarbamol  (ROBAXIN ) 500 MG tablet [id number] Yes Take 1 tablet (500 mg total) by mouth every 6 (six) hours  as needed for muscle spasms. Jolanda Nation, NP Taking Active    oxyCODONE -acetaminophen  (PERCOCET/ROXICET) 5-325 MG tablet [id number] Yes Take 1-2 tablets by mouth every 6 (six) hours as needed for severe pain (pain score 7-10). Elna Haggis, MD Taking Active   pregabalin  (LYRICA ) 50 MG capsule [id number] Yes Take 50 mg by mouth 3 (three) times daily. [provider] Taking Active   Med List Note Thaddeus Filippo, RPH-CPP 07/28/20 1202): Gleevec  (imatinib ) filled at [facility]            Recommendation:   PCP Follow-up  Follow Up Plan:   Telephone follow-up in 1 month with RN care manager  Verba Girt RN, BSN, CCM [place]  [facility], Population Health Case Manager Phone: [phone]

## 2023-12-17 NOTE — Patient Outreach (Unsigned)
 Complex Care Management   Visit Note  12/17/2023  Name:  Gerald Olson MRN: [id number] DOB: 06/13/1966  Situation: Referral received for Complex Care Management related to  Diabetes  I obtained verbal consent from Patient.  Visit completed with patient  on the phone  Background:   Past Medical History:  Diagnosis Date   ADHD (attention deficit hyperactivity disorder)    Diagnosed during teenage years   Alcohol use disorder    On average 14-21 drinks per week   Alcoholic cirrhosis of liver    Per patient - condition is not related to moderate alcohol use but rather prolonged NSAID use and recent abuse.   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  He feels this is due to sleep depravation.  Patient states he does not sleep well at all. Agreeable to social work referral.) Cognitive/Intellectual Conditions Management [RPT]: None reported or documented in medical history or problem list   Health Maintenance Behaviors: Annual physical exam Healing Pattern: Average   Neurological Neurological Review of Symptoms: Numbness (patient reports numbness/ tingling and pain in right arm. He reports weakness and no function of rt. Arm. Patient states he is being seen by the neurosurgeon and waiting for approval to have a MRI by his Medicaid insurance.) Neurological Conditions:  (patient reports having back surgery/ laminectiomy 09/2023) Neurological Management Strategies: Medication therapy, Routine screening Neurological Self-Management Outcome: 3 (uncertain)  HEENT HEENT Symptoms Reported: Runny nose, Other: (sinus burning in eyes.  Patient states he contributes these symptoms to seasonal allergies.) HEENT Conditions:  (patient reports having seasonal allergies.) HEENT Management Strategies: Routine screening HEENT Self-Management Outcome: 4 (good)  (patient reports having seasonal allergies.)  Cardiovascular Cardiovascular Symptoms Reported: No symptoms reported Weight: 212 lb (96.2 kg)  Respiratory Respiratory Symptoms Reported: No symptoms reported    Endocrine Patient reports the following symptoms related to hypoglycemia or hyperglycemia : No symptoms reported Is patient diabetic?: Yes Is patient checking blood sugars at home?: Yes Endocrine Conditions: Diabetes Endocrine Management Strategies: Routine screening, Medication therapy, Diet modification Endocrine Self-Management Outcome: 4 (good) Endocrine Comment: Patient states he eats 1 time per day.  He states he doesn't eat sugar, stays away from pastas, eats whole grain and occasional potatos and protein.  Patient states he does have concerns about food insecurity and recently spoke with community resource guide for food resources.  Gastrointestinal Gastrointestinal Symptoms Reported: Constipation Gastrointestinal Conditions: Constipation (patient reports occasional constipation. States he uses metamucil to manage.) Gastrointestinal Management Strategies: Medication therapy (OTC) Gastrointestinal  Self-Management Outcome: 4 (good) Nutrition Risk Screen (CP): No indicators present  Genitourinary Genitourinary Symptoms Reported: No symptoms reported    Integumentary Integumentary Symptoms Reported: Skin changes Additional Integumentary Details: Patient reports some bruising on his LE due to recent "9 foot fall" when walking his dog. Patient denies any open cuts/ wounds or head injury. He states he has not notified his provider or neurosurgeon about the recent fall. Skin Management Strategies:  (none verbalized per patient.) Skin Self-Management Outcome: 3 (uncertain) Skin Comment: patient advised to contact the neurosugeon's office due to recent fall or call primary care provider if unable to be seen by neurosurgeon.  Musculoskeletal Musculoskelatal Symptoms Reviewed: Difficulty walking Additional Musculoskeletal Details: reports using cane for ambulation Musculoskeletal Conditions: Back pain, Other Other Musculoskeletal Conditions: right arm pain, numbness/ tingling Musculoskeletal Management Strategies: Routine screening, Medication therapy Musculoskeletal Self-Management Outcome: 3 (uncertain) Falls in the past year?: Yes Number of falls in past year: 2 or more Was there an injury with Fall?: No (patient unsure other than bursing to LE. He reports having recent laminectomy 09/2023.  He states initially after fall he was sore however states now in last day or so he has felt a " shock"  feeling in his feet. Patient reports sustaining fall on 12/12/23) Fall Risk Category Calculator: 2 Patient Fall Risk Level: Moderate Fall Risk Patient at Risk for Falls Due to: History of fall(s) Fall risk Follow up: Falls prevention discussed  Psychosocial Psychosocial Symptoms Reported: Anxiety - if selected complete GAD Behavioral Health Conditions: Anxiety Behavioral Management Strategies:  (denies any specific treatment at this time.  Patient offered referral to social worker for follow up. Patient  verbally agreed.) Behavioral Health Self-Management Outcome: 3 (uncertain) Major Change/Loss/Stressor/Fears (CP): Medical condition, self, Resources Techniques to [place] with Loss/Stress/Change: Diversional activities Quality of Family Relationships: supportive Do you feel physically threatened by others?: No      11/20/2023   10:23 AM  Depression screen PHQ 2/9  Decreased Interest 3  Down, Depressed, Hopeless 3  PHQ - 2 Score 6  Altered sleeping 3  Tired, decreased energy 3  Change in appetite 3  Feeling bad or failure about yourself  3  Trouble concentrating 3  Moving slowly or fidgety/restless 0  Suicidal thoughts 0  PHQ-9 Score 21  Difficult doing work/chores Very difficult      12/16/2023    4:08 PM 11/20/2023   10:24 AM 05/23/2020   10:00 AM 07/01/2019    4:25 PM  GAD 7 : Generalized Anxiety Score  Nervous, Anxious, on Edge 3 1 2  0  Control/stop worrying 3 1 2  0  Worry too much - different things 2 1 3  0  Trouble relaxing 3 1 2  0  Restless 2 1 1  0  Easily annoyed or irritable 1 1 2  0  Afraid - awful might happen 3 1 1  0  Total GAD 7 Score 17 7 13  0  Anxiety Difficulty  Somewhat difficult  Not difficult at all      There were no vitals filed for this visit.  Medications Reviewed Today     Reviewed by Marvon Shillingburg E, RN (Registered Nurse) on 12/16/23 at 1547  Med List Status: <None>   Medication Order Taking? Sig Documenting Provider Last Dose Status Informant  cyclobenzaprine  (FLEXERIL ) 5 MG tablet [id number] Yes Take 1 tablet (5 mg total) by mouth 2 (two) times daily as needed for muscle spasms. Jolanda Nation, NP Taking Active   dapagliflozin  propanediol (FARXIGA ) 5 MG TABS tablet [id number]  Yes Take 1 tablet (5 mg total) by mouth daily before breakfast. Emilie Harden, MD Taking Active Self  glucose blood (ACCU-CHEK GUIDE) test strip [id number]  USE AS DIRECTED 2 TIMES DAILY Emilie Harden, MD  Active Self  ibuprofen  (ADVIL ) 200 MG tablet [id number] Yes Take  1,000 mg by mouth every 6 (six) hours as needed for moderate pain (pain score 4-6). [provider] Taking Active Self  insulin  glargine (LANTUS  SOLOSTAR) 100 UNIT/ML Solostar Pen [id number] Yes Inject 7 Units into the skin every morning. Emilie Harden, MD Taking Active Self  methocarbamol  (ROBAXIN ) 500 MG tablet [id number] Yes Take 1 tablet (500 mg total) by mouth every 6 (six) hours as needed for muscle spasms. Jolanda Nation, NP Taking Active   oxyCODONE -acetaminophen  (PERCOCET/ROXICET) 5-325 MG tablet [id number] Yes Take 1-2 tablets by mouth every 6 (six) hours as needed for severe pain (pain score 7-10). Elna Haggis, MD Taking Active   pregabalin  (LYRICA ) 50 MG capsule [id number] Yes Take 50 mg by mouth 3 (three) times daily. [provider] Taking Active   Med List Note Thaddeus Filippo, RPH-CPP 07/28/20 1202): Gleevec  (imatinib ) filled at [facility]            Recommendation:   PCP Follow-up  Follow Up Plan:   Telephone follow-up in 1 month with RN care manager   Verba Girt RN, BSN, CCM [place]  [facility], Population Health Case Manager Phone: [phone]

## 2023-12-17 NOTE — Patient Instructions (Signed)
 Visit Information  Thank you for taking time to visit with me today. Please don't hesitate to contact me if I can be of assistance to you before our next scheduled appointment.  Our next appointment is by telephone on 01/09/24 at 3 pm Please call the care guide team at [phone] if you need to cancel or reschedule your appointment.   Following is a copy of your care plan:   Goals Addressed             This Visit's Progress    VBCI RN Care Plan- Chronic right arm pain /inability to use./ back pain          Please call the Suicide and Crisis Lifeline: 988 call 1-800-273-TALK (toll free, 24 hour hotline) if you are experiencing a Mental Health or Behavioral Health Crisis or need someone to talk to.  Patient verbalizes understanding of instructions and care plan provided today and agrees to view in MyChart. Active MyChart status and patient understanding of how to access instructions and care plan via MyChart confirmed with patient.     Verba Girt RN, BSN, CCM [employer], Population Health Case Manager Phone: [phone]

## 2023-12-18 ENCOUNTER — Encounter: Payer: Self-pay | Admitting: General Practice

## 2023-12-18 ENCOUNTER — Ambulatory Visit (INDEPENDENT_AMBULATORY_CARE_PROVIDER_SITE_OTHER)
Admission: RE | Admit: 2023-12-18 | Discharge: 2023-12-18 | Disposition: A | Discharge status: Home / Self Care | Source: Ambulatory Visit | Attending: General Practice | Admitting: General Practice

## 2023-12-18 ENCOUNTER — Ambulatory Visit: Admitting: General Practice

## 2023-12-18 VITALS — BP 150/92 | HR 97 | Temp 98.0°F | Ht 74.0 in | Wt 217.4 lb

## 2023-12-18 DIAGNOSIS — M47816 Spondylosis without myelopathy or radiculopathy, lumbar region: Secondary | ICD-10-CM | POA: Diagnosis not present

## 2023-12-18 DIAGNOSIS — M545 Low back pain, unspecified: Secondary | ICD-10-CM

## 2023-12-18 DIAGNOSIS — Z7984 Long term (current) use of oral hypoglycemic drugs: Secondary | ICD-10-CM

## 2023-12-18 DIAGNOSIS — W19XXXA Unspecified fall, initial encounter: Secondary | ICD-10-CM

## 2023-12-18 DIAGNOSIS — E1165 Type 2 diabetes mellitus with hyperglycemia: Secondary | ICD-10-CM | POA: Diagnosis not present

## 2023-12-18 DIAGNOSIS — M5135 Other intervertebral disc degeneration, thoracolumbar region: Secondary | ICD-10-CM | POA: Diagnosis not present

## 2023-12-18 DIAGNOSIS — E1159 Type 2 diabetes mellitus with other circulatory complications: Secondary | ICD-10-CM | POA: Diagnosis not present

## 2023-12-18 DIAGNOSIS — R29898 Other symptoms and signs involving the musculoskeletal system: Secondary | ICD-10-CM

## 2023-12-18 DIAGNOSIS — I152 Hypertension secondary to endocrine disorders: Secondary | ICD-10-CM

## 2023-12-18 DIAGNOSIS — Z981 Arthrodesis status: Secondary | ICD-10-CM | POA: Diagnosis not present

## 2023-12-18 DIAGNOSIS — Z043 Encounter for examination and observation following other accident: Secondary | ICD-10-CM | POA: Diagnosis not present

## 2023-12-18 NOTE — Assessment & Plan Note (Signed)
 Controlled.   He will get records faxed over for his eye exam.  Continue Lantus  7 units daily and Farxiga  5 mg daily.  Urine ACR UTD.  Foot exam UTD.  CMP UTD.  Lipid panel pending- consider starting statin for CVD protection.

## 2023-12-18 NOTE — Patient Instructions (Addendum)
 Complete xray(s) prior to leaving today. I will notify you of your results once received.   Stop by the lab prior to leaving today. I will notify you of your results once received.   Please call and get your eye exam results faxed to our office.   Please call your neurosurgeon's office and let them know about your fall and ask about your ALS and MS testing.   Follow up in 2 weeks for BP.  Start monitoring your blood pressure daily, around the same time of day, for the next 2-3 weeks.  Ensure that you have rested for 30 minutes prior to checking your blood pressure.   Record your readings and notify me if you see numbers consistently at or above 130 on top and/or 90 on bottom.  It was a pleasure to see you today!

## 2023-12-18 NOTE — Assessment & Plan Note (Signed)
 Continue flexeril  and robaxin .  Follow up with pain management and neurosurgery.

## 2023-12-18 NOTE — Assessment & Plan Note (Signed)
 BP elevated on both readings today.  Discussed checking BP at home.  He will bring back the BP log in 2 weeks.   Will continue to monitor.

## 2023-12-18 NOTE — Progress Notes (Signed)
 Established Patient Office Visit  Subjective   Patient ID: Gerald Olson, male    DOB: Oct 09, 1965  Age: 58 y.o. MRN: [id number]  Chief Complaint  Patient presents with   Medical Management of Chronic Issues    4 week follow up    HPI  Gerald Olson is a 58 year old male with past medical history of HTN, alcoholic cirrhosis, type 2 DM, depression, GAD, ADHD presents today for a follow up.   Fall on 12/12/23- he was walking and fell on the rocks and landed on sacrum and then slid on his back. He is still having some back pain. He did see the neurosurgery on 12/11/23 and had x-rays but his fall was next day. He denies any urinary incontinence or bowel incontinence. He has not taken any medication other than his scheduled medication.  DM: followed by endocrinology. Reviewed notes and recommendations from 12/03/23. Currently managed on Lantus  7 units in am and Farixga 5 mg daily. Urine ACR was within normal limits. Hemoglobin A1c was 6.2. he reports he has had the eye exam as well.   Right elbow pain: he has seen [place] neurosurgery on 12/11/23. He reports that his pain is about the same. He continues to have limited ROM. He has had x-rays but has not received the results. He has been prescribed Lyrica  but does not take it as prescribed. He was given flexeril  at the last visit but he has only taken it twice. He has used his sister-in-law's baclofen 10 mg and felt relief. He has not been following with pain management clinic.   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, with long-term current use of insulin  (HCC) 11/20/2023   Right arm weakness 11/20/2023   Establishing care with new doctor, encounter for 11/20/2023   Encounter for screening involving social determinants of health (SDoH) 11/20/2023   Other spondylosis with radiculopathy, lumbar region 10/07/2023   Onychomycosis  09/21/2022   Other fatigue 09/20/2021   Spondylolisthesis at L5-S1 level 01/24/2021   Cervical radicular pain 10/26/2020   Lumbar facet arthropathy 10/26/2020   Lumbar degenerative disc disease 10/26/2020   Fall 08/09/2020   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 08/09/2020   Mesenteric lymphadenopathy 06/14/2020   Splenomegaly- mild  06/14/2020   History of cannabis abuse 06/14/2020   Recurrent major depressive disorder, in partial remission (HCC) 06/09/2020   Anxiety 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 06/09/2020   Alcohol use disorder, moderate, dependence (HCC) 06/09/2020   Foraminal stenosis of cervical region 05/09/2020   Diabetic polyneuropathy    ADHD (attention deficit hyperactivity disorder)    Generalized anxiety disorder    Major depressive disorder    Cervical spondylosis 09/22/2019   Hypertension associated with diabetes (HCC) 01/28/2019   Alcoholic cirrhosis of liver 06/02/2018   Type 2 diabetes mellitus with hyperglycemia 04/25/2018   Past Medical History:  Diagnosis Date   ADHD (attention deficit hyperactivity disorder)    Diagnosed during teenage years   Alcohol use disorder    On average 14-21 drinks per week   Alcoholic cirrhosis of liver    Per patient - condition is not related to moderate alcohol use but rather prolonged NSAID use and recent abuse.   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[place]   Major depressive disorder    Other spondylosis with radiculopathy, cervical region    Retinopathy    Right eye; per  patient report   Type 2 diabetes mellitus with hyperglycemia    Past Surgical History:  Procedure Laterality Date   CHOLECYSTECTOMY N/A 06/02/2018   Procedure: LAPAROSCOPIC CHOLECYSTECTOMY;  Surgeon: Franki Isles, MD;  Location: [facility] ORS;  Service: General;  Laterality: N/A;   KNEE ARTHROSCOPY Right    30 Years ago   lumbar five-sacral one posterior lumbar interbody fusion  01/24/2021   LUMBAR LAMINECTOMY/DECOMPRESSION MICRODISCECTOMY Bilateral 10/07/2023   Procedure: Laminectomy for Facet/Synovial Cyst - Lumbar Four-Lumbar Five - Bilateral;  Surgeon: Elna Haggis, MD;  Location: [facility] OR;  Service: Neurosurgery;  Laterality: Bilateral;  C3   No Known Allergies       12/18/2023    3:10 PM 11/20/2023   10:23 AM 05/30/2023    2:50 PM  Depression screen PHQ 2/9  Decreased Interest 3 3 3   Down, Depressed, Hopeless 3 3 3   PHQ - 2 Score 6 6 6   Altered sleeping 3 3   Tired, decreased energy 3 3   Change in appetite 2 3   Feeling bad or failure about yourself  3 3   Trouble concentrating 3 3   Moving slowly or fidgety/restless 0 0   Suicidal thoughts 0 0   PHQ-9 Score 20 21   Difficult doing work/chores Extremely dIfficult Very difficult        12/18/2023    3:10 PM 12/16/2023    4:08 PM 11/20/2023   10:24 AM 05/23/2020   10:00 AM  GAD 7 : Generalized Anxiety Score  Nervous, Anxious, on Edge 3 3 1 2   Control/stop worrying 2 3 1 2   Worry too much - different things 2 2 1 3   Trouble relaxing 2 3 1 2   Restless 1 2 1 1   Easily annoyed or irritable 1 1 1 2   Afraid - awful might happen 2 3 1 1   Total GAD 7 Score 13 17 7 13   Anxiety Difficulty Somewhat difficult  Somewhat difficult       Review of Systems  Constitutional:  Negative for chills and fever.  Respiratory:  Negative for shortness of breath.   Cardiovascular:  Negative for chest pain.  Gastrointestinal:  Negative for abdominal pain, constipation, diarrhea, heartburn, nausea and vomiting.  Genitourinary:  Negative for  dysuria, frequency and urgency.  Musculoskeletal:  Positive for joint pain.       Right arm pain.  Neurological:  Negative for dizziness and headaches.  Endo/Heme/Allergies:  Negative for polydipsia.  Psychiatric/Behavioral:  Negative for depression and suicidal ideas. The patient is not nervous/anxious.       Objective:     BP (!) 150/92 (BP Location: Left Arm, Patient Position: Sitting)   Pulse 97   Temp 98 F (36.7 C) (Temporal)   Ht 6\' 2"  (1.88 m)   Wt 217 lb 6.4 oz (98.6 kg)   SpO2 97%   BMI 27.91 kg/m  BP Readings from Last 3 Encounters:  12/18/23 (!) 150/92  12/03/23 138/86  11/20/23 128/62  Wt Readings from Last 3 Encounters:  12/18/23 217 lb 6.4 oz (98.6 kg)  12/16/23 212 lb (96.2 kg)  12/03/23 217 lb 9.6 oz (98.7 kg)      Physical Exam Vitals and nursing note reviewed.  Constitutional:      Appearance: Normal appearance.  Cardiovascular:     Rate and Rhythm: Normal rate and regular rhythm.     Pulses: Normal pulses.     Heart sounds: Normal heart sounds.  Pulmonary:     Effort: Pulmonary effort is normal.     Breath sounds: Normal breath sounds.  Musculoskeletal:     Comments: Limited ROM in right arm and elbow.  Neurological:     Mental Status: He is alert and oriented to person, place, and time.  Psychiatric:        Mood and Affect: Mood normal.        Behavior: Behavior normal.        Thought Content: Thought content normal.        Judgment: Judgment normal.      No results found for any visits on 12/18/23.     The ASCVD Risk score (Arnett DK, et al., 2019) failed to calculate for the following reasons:   The valid total cholesterol range is 130 to 320 mg/dL    Assessment & Plan:  Fall, initial encounter Assessment & Plan: Exam stable overall.  Does have mid lower back pain on palpation.   No red flag symptoms.   STAT x-ray order placed for lumbar spine.  Await results.   Orders: -     DG Lumbar Spine Complete  Uncontrolled  type 2 diabetes mellitus with hyperglycemia (HCC) Assessment & Plan: Controlled.   He will get records faxed over for his eye exam.  Continue Lantus  7 units daily and Farxiga  5 mg daily.  Urine ACR UTD.  Foot exam UTD.  CMP UTD.  Lipid panel pending- consider starting statin for CVD protection.   Orders: -     Lipid panel  Right arm weakness Assessment & Plan: Continue flexeril  and robaxin .  Follow up with pain management and neurosurgery.    Hypertension associated with diabetes (HCC) Assessment & Plan: BP elevated on both readings today.  Discussed checking BP at home.  He will bring back the BP log in 2 weeks.   Will continue to monitor.     Return in about 2 weeks (around 01/01/2024) for bp.    Jolanda Nation, NP

## 2023-12-18 NOTE — Assessment & Plan Note (Signed)
 Exam stable overall.  Does have mid lower back pain on palpation.   No red flag symptoms.   STAT x-ray order placed for lumbar spine.  Await results.

## 2023-12-19 ENCOUNTER — Encounter: Payer: Self-pay | Admitting: General Practice

## 2023-12-19 LAB — LIPID PANEL
Cholesterol: 153 mg/dL (ref 0–200)
HDL: 35.9 mg/dL — ABNORMAL LOW (ref >39.00)
LDL Cholesterol: 89 mg/dL (ref 0–99)
NonHDL: 116.83
Total CHOL/HDL Ratio: 4
Triglycerides: 140 mg/dL (ref 0.0–149.0)
VLDL: 28 mg/dL (ref 0.0–40.0)

## 2023-12-31 DIAGNOSIS — M5412 Radiculopathy, cervical region: Secondary | ICD-10-CM | POA: Diagnosis not present

## 2023-12-31 DIAGNOSIS — M4802 Spinal stenosis, cervical region: Secondary | ICD-10-CM | POA: Diagnosis not present

## 2023-12-31 DIAGNOSIS — M47813 Spondylosis without myelopathy or radiculopathy, cervicothoracic region: Secondary | ICD-10-CM | POA: Diagnosis not present

## 2023-12-31 DIAGNOSIS — M4313 Spondylolisthesis, cervicothoracic region: Secondary | ICD-10-CM | POA: Diagnosis not present

## 2023-12-31 DIAGNOSIS — M47812 Spondylosis without myelopathy or radiculopathy, cervical region: Secondary | ICD-10-CM | POA: Diagnosis not present

## 2024-01-07 ENCOUNTER — Inpatient Hospital Stay: Payer: Medicaid Other | Attending: Oncology

## 2024-01-07 DIAGNOSIS — C9211 Chronic myeloid leukemia, BCR/ABL-positive, in remission: Secondary | ICD-10-CM | POA: Diagnosis not present

## 2024-01-07 DIAGNOSIS — C9210 Chronic myeloid leukemia, BCR/ABL-positive, not having achieved remission: Secondary | ICD-10-CM

## 2024-01-07 DIAGNOSIS — D649 Anemia, unspecified: Secondary | ICD-10-CM | POA: Diagnosis not present

## 2024-01-07 LAB — CBC WITH DIFFERENTIAL/PLATELET
Abs Immature Granulocytes: 0.02 10*3/uL (ref 0.00–0.07)
Basophils Absolute: 0 10*3/uL (ref 0.0–0.1)
Basophils Relative: 1 %
Eosinophils Absolute: 0.1 10*3/uL (ref 0.0–0.5)
Eosinophils Relative: 2 %
HCT: 39 % (ref 39.0–52.0)
Hemoglobin: 11.9 g/dL — ABNORMAL LOW (ref 13.0–17.0)
Immature Granulocytes: 0 %
Lymphocytes Relative: 20 %
Lymphs Abs: 1.2 10*3/uL (ref 0.7–4.0)
MCH: 24.2 pg — ABNORMAL LOW (ref 26.0–34.0)
MCHC: 30.5 g/dL (ref 30.0–36.0)
MCV: 79.3 fL — ABNORMAL LOW (ref 80.0–100.0)
Monocytes Absolute: 0.6 10*3/uL (ref 0.1–1.0)
Monocytes Relative: 10 %
Neutro Abs: 3.8 10*3/uL (ref 1.7–7.7)
Neutrophils Relative %: 67 %
Platelets: 326 10*3/uL (ref 150–400)
RBC: 4.92 MIL/uL (ref 4.22–5.81)
RDW: 15.7 % — ABNORMAL HIGH (ref 11.5–15.5)
WBC: 5.8 10*3/uL (ref 4.0–10.5)
nRBC: 0 % (ref 0.0–0.2)

## 2024-01-07 LAB — CMP (CANCER CENTER ONLY)
ALT: 28 U/L (ref 0–44)
AST: 36 U/L (ref 15–41)
Albumin: 3.6 g/dL (ref 3.5–5.0)
Alkaline Phosphatase: 77 U/L (ref 38–126)
Anion gap: 10 (ref 5–15)
BUN: 17 mg/dL (ref 6–20)
CO2: 24 mmol/L (ref 22–32)
Calcium: 8.4 mg/dL — ABNORMAL LOW (ref 8.9–10.3)
Chloride: 101 mmol/L (ref 98–111)
Creatinine: 0.81 mg/dL (ref 0.61–1.24)
GFR, Estimated: >60 mL/min (ref >60)
Glucose, Bld: 168 mg/dL — ABNORMAL HIGH (ref 70–99)
Potassium: 4 mmol/L (ref 3.5–5.1)
Sodium: 135 mmol/L (ref 135–145)
Total Bilirubin: 0.7 mg/dL (ref 0.0–1.2)
Total Protein: 6.2 g/dL — ABNORMAL LOW (ref 6.5–8.1)

## 2024-01-07 LAB — MAGNESIUM: Magnesium: 2.3 mg/dL (ref 1.7–2.4)

## 2024-01-07 LAB — PHOSPHORUS: Phosphorus: 3.6 mg/dL (ref 2.5–4.6)

## 2024-01-08 ENCOUNTER — Other Ambulatory Visit: Payer: Self-pay | Admitting: Internal Medicine

## 2024-01-08 ENCOUNTER — Other Ambulatory Visit: Payer: Self-pay

## 2024-01-09 ENCOUNTER — Other Ambulatory Visit: Payer: Self-pay

## 2024-01-09 ENCOUNTER — Other Ambulatory Visit (HOSPITAL_COMMUNITY): Payer: Self-pay

## 2024-01-09 DIAGNOSIS — Z139 Encounter for screening, unspecified: Secondary | ICD-10-CM

## 2024-01-09 MED ORDER — TECHLITE PEN NEEDLES 32G X 4 MM MISC
11 refills | Status: AC
  Filled 2024-01-09: qty 100, 34d supply, fill #0
  Filled 2024-02-08: qty 100, 34d supply, fill #1
  Filled 2024-07-17 – 2024-08-12 (×2): qty 100, 34d supply, fill #2

## 2024-01-09 NOTE — Patient Outreach (Signed)
 Complex Care Management   Visit Note  01/09/2024  Name:  Gerald Olson MRN: [id number] DOB: [birth year]  Situation: Referral received for Complex Care Management related to diabetes I obtained verbal consent from Patient.  Visit completed with patient  on the phone  Background:   Past Medical History:  Diagnosis Date   ADHD (attention deficit hyperactivity disorder)    Diagnosed during teenage years   Alcohol use disorder    On average 14-21 drinks per week   Alcoholic cirrhosis of liver    Per patient - condition is not related to moderate alcohol use but rather prolonged NSAID use and recent abuse.   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  patient states urine color light grey. Additional Genitourinary Details: Patient states foul smelling urine and light grey color has been going on for several years. He states this has been discussed several times with endocrinologist as well as urologist. Has had testing done in the past and no signs of issues. Advised to follow up with primary care provider for  concerns.    Integumentary      Musculoskeletal          Psychosocial              12/18/2023    3:10 PM  Depression screen PHQ 2/9  Decreased Interest 3  Down, Depressed, Hopeless 3  PHQ - 2 Score 6  Altered sleeping 3  Tired, decreased energy 3  Change in appetite 2  Feeling bad or failure about yourself  3  Trouble concentrating 3  Moving slowly or fidgety/restless 0  Suicidal thoughts 0  PHQ-9 Score 20  Difficult doing work/chores Extremely dIfficult    There were no vitals filed for this visit.  Medications Reviewed Today     Reviewed by Jori Frerichs E, RN (Registered Nurse) on 01/09/24 at 1534  Med List Status: <None>   Medication Order Taking? Sig Documenting Provider Last Dose Status Informant  cyclobenzaprine  (FLEXERIL ) 5 MG tablet [id number] Yes Take 1 tablet (5 mg total) by mouth 2 (two) times daily as needed for muscle spasms. Jolanda Nation, NP Taking Active   dapagliflozin  propanediol (FARXIGA ) 5 MG TABS tablet [id number] Yes Take 1 tablet (5 mg total) by mouth daily before breakfast. Emilie Harden, MD Taking Active Self  glucose blood (ACCU-CHEK GUIDE) test strip [id number]  USE AS DIRECTED 2 TIMES DAILY Emilie Harden, MD  Active Self  ibuprofen  (ADVIL ) 200 MG tablet [id number] Yes Take 1,000 mg by mouth every 6 (six) hours as needed for moderate pain (pain score 4-6). [provider] Taking Active Self  insulin  glargine (LANTUS  SOLOSTAR) 100 UNIT/ML Solostar Pen [id number] Yes Inject 7 Units into the skin every morning. Emilie Harden, MD Taking Active Self  methocarbamol  (ROBAXIN ) 500 MG tablet [id number] Yes Take 1 tablet (500 mg total) by mouth every 6 (six) hours as needed for muscle spasms. Jolanda Nation, NP Taking Active   oxyCODONE -acetaminophen  (PERCOCET/ROXICET) 5-325 MG tablet [id number] Yes Take 1-2 tablets by mouth every 6 (six) hours as needed for severe pain (pain score 7-10). Elna Haggis, MD Taking Active   pregabalin  (LYRICA )  50 MG capsule [id number] Yes Take 50 mg by mouth 3 (three) times daily. [provider] Taking Active            Med Note Marrie Sizer, Albertus Chiarelli E   Thu Jan 09, 2024  3:32 PM) Patient states take as needed.   Med List Note Thaddeus Filippo, RPH-CPP 07/28/20 1202): Gleevec  (imatinib ) filled at [facility]            Recommendation:   PCP Follow-up  Follow Up Plan:   Telephone follow-up in 1 month with case manager  Verba Girt RN, BSN, CCM [place]  [facility], Population Health Case Manager Phone: [phone]

## 2024-01-10 ENCOUNTER — Other Ambulatory Visit (HOSPITAL_COMMUNITY): Payer: Self-pay

## 2024-01-10 DIAGNOSIS — Z6827 Body mass index (BMI) 27.0-27.9, adult: Secondary | ICD-10-CM | POA: Diagnosis not present

## 2024-01-10 DIAGNOSIS — M5412 Radiculopathy, cervical region: Secondary | ICD-10-CM | POA: Diagnosis not present

## 2024-01-11 LAB — BCR-ABL1, CML/ALL, PCR, QUANT
E1A2 Transcript: <0.0032 %
Interpretation (BCRAL):: NEGATIVE
b2a2 transcript: <0.0032 %
b3a2 transcript: <0.0032 %

## 2024-01-11 NOTE — Patient Instructions (Signed)
 Visit Information  Thank you for taking time to visit with me today. Please don't hesitate to contact me if I can be of assistance to you before our next scheduled appointment.  Your next care management appointment is by telephone on 02/03/24 at 3 pm  Telephone follow-up in 1 month with RN case manager   Please call the care guide team at [phone] if you need to cancel, schedule, or reschedule an appointment.   Please call the Suicide and Crisis Lifeline: 988 call 1-800-273-TALK (toll free, 24 hour hotline) if you are experiencing a Mental Health or Behavioral Health Crisis or need someone to talk to.  Verba Girt RN, BSN, CCM [employer], Population Health Case Manager Phone: [phone]

## 2024-01-14 ENCOUNTER — Other Ambulatory Visit: Payer: Self-pay | Admitting: Neurological Surgery

## 2024-01-17 ENCOUNTER — Other Ambulatory Visit (HOSPITAL_COMMUNITY): Payer: Self-pay

## 2024-01-17 ENCOUNTER — Other Ambulatory Visit: Payer: Self-pay | Admitting: Physical Medicine & Rehabilitation

## 2024-01-21 MED ORDER — PREGABALIN 100 MG PO CAPS
100.0000 mg | ORAL_CAPSULE | Freq: Two times a day (BID) | ORAL | 2 refills | Status: DC
  Filled 2024-01-21: qty 60, 30d supply, fill #0
  Filled 2024-04-02: qty 60, 30d supply, fill #1

## 2024-01-22 ENCOUNTER — Other Ambulatory Visit: Payer: Self-pay

## 2024-02-03 ENCOUNTER — Other Ambulatory Visit: Payer: Self-pay | Admitting: *Deleted

## 2024-02-03 ENCOUNTER — Encounter: Payer: Self-pay | Admitting: *Deleted

## 2024-02-04 ENCOUNTER — Other Ambulatory Visit: Payer: Self-pay

## 2024-02-04 NOTE — Patient Instructions (Signed)
 Visit Information  [name] was given information about Medicaid Managed Care team care coordination services as a part of their Healthy [facility] Medicaid benefit. Gerald Olson verbally consented to engagement with the [facility] Managed Care team.   If you are experiencing a medical emergency, please call 911 or report to your local emergency department or urgent care.   If you have a non-emergency medical problem during routine business hours, please contact your provider's office and ask to speak with a nurse.   For questions related to your Healthy [facility] health plan, please call: [phone] or visit the homepage here: [email or web address]  If you would like to schedule transportation through your Healthy [facility] plan, please call the following number at least 2 days in advance of your appointment: [phone]  For information about your ride after you set it up, call Ride Assist at [phone]. Use this number to activate a Will Call pickup, or if your transportation is late for a scheduled pickup. Use this number, too, if you need to make a change or cancel a previously scheduled reservation.  If you need transportation services right away, call [phone]. The after-hours call center is staffed 24 hours to handle ride assistance and urgent reservation requests (including discharges) 365 days a year. Urgent trips include sick visits, hospital discharge requests and life-sustaining treatment.  Call the [facility] Line at [phone], at any time, 24 hours a day, 7 days a week. If you are in danger or need immediate medical attention call 911.  If you would like help to quit smoking, call 1-800-QUIT-NOW (604-512-0238) OR Espaol: 1-855-Djelo-Ya (3-295-188-4166) o para ms informacin haga clic aqu or Text READY to 063-016 to register via text  Mr. Drudge - following are the goals we discussed in your visit today:   Goals  Addressed             This Visit's Progress    VBCI Social Work Care Plan: LCSW       Problems:   Disease Management support and education needs related to Depression: anxiety and housing concerns.  CSW Clinical Goal(s):    Over the next 90 days the Patient will demonstrate a reduction in symptoms related to Depression: anxiety.  Explore community resource options for unmet needs related to housing concerns. Interventions:  Mental Health:  Evaluation of current treatment plan related to Depression: anxiety Active listening / Reflection utilized Behavioral Activation reviewed Depression screen reviewed Emotional Support Provided Motivational Interviewing employed Participation in counseling encourage once provider list is provider. LCSW and patient discussed patient agreeable to an in person psychotherapist. Patient has declined assessment for medication management for depression and anxiety. LCSW provided 988 crisis line. SDOH-Housing Discussed providing community agency that may be able to provide resources for mold. Patient Goals/Self-Care Activities:  Connect with provider for ongoing mental health treatment once provider list is provided.             Connect with community resources for mold once provider list is provided. Plan:   The care management team will reach out to the patient again on 02/18/2024 at 2:00 PM.         Patient verbalizes understanding of instructions and care plan provided today and agrees to view in MyChart. Active MyChart status and patient understanding of how to access instructions and care plan via MyChart confirmed with patient.      LCSW will follow up 02/18/2024 at 2:00 PM.  Gerald Olson, MSW, LCSW [place]  [facility], Population Health Licensed Clinical Social Worker Direct Dial : [phone]   Following is a copy of your plan of care:  There are no care plans that you recently modified to display for this  patient.

## 2024-02-04 NOTE — Patient Outreach (Addendum)
 Complex Care Management   Visit Note  02/04/2024  Name:  Gerald Olson MRN: [id number] DOB: 04-15-1966  Situation: Referral received for Complex Care Management related to Mental/Behavioral Health diagnosis depression/anxiety and SDOH Barriers:  Housing (mold) I obtained verbal consent from Patient.  Visit completed with patient  on the phone  Background:   Past Medical History:  Diagnosis Date   ADHD (attention deficit hyperactivity disorder)    Diagnosed during teenage years   Alcohol use disorder    On average 14-21 drinks per week   Alcoholic cirrhosis of liver    Per patient - condition is not related to moderate alcohol use but rather prolonged NSAID use and recent abuse.   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[place]   Major depressive disorder    Other spondylosis with radiculopathy, cervical region    Retinopathy    Right eye; per patient report   Type 2 diabetes mellitus with hyperglycemia     Assessment: Patient Reported Symptoms:  Cognitive Cognitive Status: Able to follow simple commands, Alert and oriented to person, place, and time, Insightful and able to interpret abstract concepts, Normal speech and language skills Cognitive/Intellectual Conditions Management [RPT]: None reported or documented in medical history or problem list   Health Maintenance Behaviors: Annual physical exam Healing Pattern: Average  Neurological Neurological Review of Symptoms: Not assessed    HEENT HEENT Symptoms Reported: Not assessed      Cardiovascular Cardiovascular  Symptoms Reported: Not assessed    Respiratory Respiratory Symptoms Reported: Not assesed    Endocrine Patient reports the following symptoms related to hypoglycemia or hyperglycemia : Not assessed    Gastrointestinal Gastrointestinal Symptoms Reported: Not assessed      Genitourinary Genitourinary Symptoms Reported: Not assessed    Integumentary Integumentary Symptoms Reported: Not assessed    Musculoskeletal Musculoskelatal Symptoms Reviewed: Not assessed        Psychosocial Psychosocial Symptoms Reported: Depression - if selected complete PHQ 2-9, Anxiety - if selected complete GAD Additional Psychological Details: Patient is agreeable to an in person psychotherapist Behavioral Health Conditions: Anxiety, Depression Behavioral Management Strategies: Activity, Coping strategies (declind medication management) Major Change/Loss/Stressor/Fears (CP): Medical condition, self Techniques to Cope with Loss/Stress/Change: Diversional activities (Patient reports that he watches funny videos) Quality of Family Relationships: supportive Do you feel physically threatened by others?: Yes      02/04/2024    2:22 PM  Depression screen PHQ 2/9  Decreased Interest 3  Down, Depressed, Hopeless 3  PHQ - 2 Score 6  Altered sleeping 3  Tired, decreased energy 3  Change in appetite 2  Feeling bad or failure about yourself  2  Trouble concentrating 2  Moving slowly or fidgety/restless 0  Suicidal thoughts 0  PHQ-9 Score 18  Difficult doing work/chores Very difficult    There were no vitals filed for this visit.  Medications Reviewed Today     Reviewed by Festus Hubert (Social Worker) on 02/04/24 at 1417  Med List Status: <None>  Medication Order Taking? Sig Documenting Provider Last Dose Status Informant  ALPHA LIPOIC ACID PO [id number] No Take 4 capsules by mouth daily with lunch. [provider] Taking Active Self  aspirin 325 MG tablet [id number] No Take 1,300 mg by  mouth every 8 (eight) hours as needed. [provider] Taking Active Self  BLACK CURRANT SEED OIL PO [id number] No Take 1 capsule by mouth daily with lunch. [provider] Taking Active Self  Cholecalciferol (VITAMIN D -3 PO) [id number] No Take 5,000 Units by mouth daily with lunch. [provider] Taking Active Self  cyclobenzaprine  (FLEXERIL ) 5 MG tablet [id number] No Take 1 tablet (5 mg total) by mouth 2 (two) times daily as needed for muscle spasms. Jolanda Nation, NP Taking Active Self  dapagliflozin  propanediol (FARXIGA ) 5 MG TABS tablet [id number] No Take 1 tablet (5 mg total) by mouth daily before breakfast. Emilie Harden, MD Taking Active Self  diclofenac  (VOLTAREN ) 50 MG EC tablet [id number] No Take 50 mg by mouth daily as needed (pain). [provider] Taking Active Self  glucose blood (ACCU-CHEK GUIDE) test strip [id number] No USE AS DIRECTED 2 TIMES DAILY Emilie Harden, MD Taking Active Self  ibuprofen  (ADVIL ) 200 MG tablet [id number] No Take 800-1,200 mg by mouth 2 (two) times daily as needed (pain.). [provider] Taking Active Self  insulin  glargine (LANTUS  SOLOSTAR) 100 UNIT/ML Solostar Pen [id number] No Inject 7 Units into the skin every morning. Emilie Harden, MD Taking Active Self  Insulin  Pen Needle (TECHLITE PEN NEEDLES) 32G X 4 MM MISC [id number] No Use to inject Victoza  once a day Emilie Harden, MD Taking Active Self  MAGNESIUM COMPLEX HIGH POTENCY PO [id number] No Take 1 capsule by mouth in the morning. [provider] Taking Active Self  methocarbamol  (ROBAXIN ) 500 MG tablet [id number] No Take 1 tablet (500 mg total) by mouth every 6 (six) hours as needed for muscle spasms.  Patient taking differently: Take 500 mg by mouth daily as needed for muscle spasms.   Jolanda Nation, NP Taking Active Self  Multiple Vitamins-Minerals (ALIVE MENS GUMMY MULTIVITAMINS PO) [id number] No Take 4 each by mouth in the morning. [provider] Taking Active Self  naproxen sodium (ALEVE) 220 MG tablet [id number] No Take 660 mg by mouth 2 (two) times daily as needed (pain.). [provider] Taking Active Self  OVER THE COUNTER MEDICATION [id number] No Take 5 mLs by mouth every 3 (three) hours as needed (pain). Wild Lettuce Tincture [provider] Taking Active Self  pregabalin  (LYRICA ) 100 MG capsule [id number] No Take 1 capsule (100 mg total) by mouth 2 (two) times daily.  Patient taking differently: Take 100 mg by mouth at bedtime as needed (pain.).   Lylia Sand, MD Taking Active Self  TURMERIC-GINGER PO [id number] No Take 3 capsules by mouth in the morning. With Garlic [provider] Taking Active Self  zinc sulfate, 50mg  elemental zinc, 220 (50 Zn) MG capsule [id number] No Take 220 mg by mouth daily after lunch. [provider] Taking Active Self  Med List Note Thaddeus Filippo, RPH-CPP 07/28/20 1202): Gleevec  (imatinib ) filled at [facility]            Recommendation:   PCP Follow-up  Follow Up Plan:   Telephone follow-up 02/19/2024 at 2:00 PM  [name], MSW, LCSW   [facility], [facility] Licensed Clinical Social Worker Direct Dial : [phone]

## 2024-02-04 NOTE — Patient Outreach (Unsigned)
 Complex Care Management   Visit Note  02/04/2024  Name:  Gerald Olson MRN: [id number] DOB: [birth year]  Situation: Referral received for Complex Care Management related to financial resource strain and unsafe housing conditions. Patient has chronic pain and diabetes. I obtained verbal consent from Patient.  Visit completed with [name] on the phone  Background:   Past Medical History:  Diagnosis Date   ADHD (attention deficit hyperactivity disorder)    Diagnosed during teenage years   Alcohol use disorder    On average 14-21 drinks per week   Alcoholic cirrhosis of liver    Per patient - condition is not related to moderate alcohol use but rather prolonged NSAID use and recent abuse.   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[place]   Major depressive disorder    Other spondylosis with radiculopathy, cervical region    Retinopathy    Right eye; per patient report   Type 2 diabetes mellitus with hyperglycemia     Assessment: Patient Reported Symptoms:  Cognitive        Neurological      HEENT        Cardiovascular      Respiratory      Endocrine      Gastrointestinal        Genitourinary      Integumentary      Musculoskeletal          Psychosocial              12/18/2023    3:10 PM  Depression screen PHQ 2/9  Decreased Interest 3  Down, Depressed, Hopeless 3  PHQ - 2 Score 6  Altered sleeping 3  Tired, decreased energy 3  Change in appetite 2  Feeling bad or failure about yourself  3  Trouble concentrating 3  Moving slowly or  fidgety/restless 0  Suicidal thoughts 0  PHQ-9 Score 20  Difficult doing work/chores Extremely dIfficult    There were no vitals filed for this visit.  Medications Reviewed Today     Reviewed by Ethelene Herald, RN (Registered Nurse) on 02/03/24 at 1533  Med List Status: <None>   Medication Order Taking? Sig Documenting Provider Last Dose Status Informant  ALPHA LIPOIC ACID PO [id number] Yes Take 4 capsules by mouth daily with lunch. [provider] Taking Active Self  aspirin 325 MG tablet [id number] Yes Take 1,300 mg by mouth every 8 (eight) hours as needed. [provider] Taking Active Self  BLACK CURRANT SEED OIL PO [id number] Yes Take 1 capsule by mouth daily with lunch. [provider] Taking Active Self  Cholecalciferol (VITAMIN D -3 PO) [id number] Yes Take 5,000 Units by mouth daily with lunch. [provider] Taking Active Self  cyclobenzaprine  (FLEXERIL ) 5 MG tablet [id number] Yes Take 1 tablet (5 mg total) by mouth 2 (two) times daily as needed for muscle spasms. Jolanda Nation, NP Taking Active Self  dapagliflozin  propanediol (FARXIGA ) 5 MG TABS tablet [id number] Yes Take 1 tablet (5 mg  total) by mouth daily before breakfast. Emilie Harden, MD Taking Active Self  diclofenac  (VOLTAREN ) 50 MG EC tablet [id number] Yes Take 50 mg by mouth daily as needed (pain). [provider] Taking Active Self  glucose blood (ACCU-CHEK GUIDE) test strip [id number] Yes USE AS DIRECTED 2 TIMES DAILY Emilie Harden, MD Taking Active Self  ibuprofen  (ADVIL ) 200 MG tablet [id number] Yes Take 800-1,200 mg by mouth 2 (two) times daily as needed (pain.). [provider] Taking Active Self  insulin  glargine (LANTUS  SOLOSTAR) 100 UNIT/ML Solostar Pen [id number] Yes Inject 7 Units into the skin every morning. Emilie Harden, MD Taking Active Self  Insulin  Pen Needle (TECHLITE PEN NEEDLES) 32G X 4 MM MISC [id number] Yes Use to inject Victoza  once a day  Emilie Harden, MD Taking Active Self  MAGNESIUM COMPLEX HIGH POTENCY PO [id number] Yes Take 1 capsule by mouth in the morning. [provider] Taking Active Self  methocarbamol  (ROBAXIN ) 500 MG tablet [id number] Yes Take 1 tablet (500 mg total) by mouth every 6 (six) hours as needed for muscle spasms.  Patient taking differently: Take 500 mg by mouth daily as needed for muscle spasms.   Jolanda Nation, NP Taking Active Self  Multiple Vitamins-Minerals (ALIVE MENS GUMMY MULTIVITAMINS PO) [id number] Yes Take 4 each by mouth in the morning. [provider] Taking Active Self  naproxen sodium (ALEVE) 220 MG tablet [id number] Yes Take 660 mg by mouth 2 (two) times daily as needed (pain.). [provider] Taking Active Self  OVER THE COUNTER MEDICATION [id number] Yes Take 5 mLs by mouth every 3 (three) hours as needed (pain). Wild Lettuce Tincture [provider] Taking Active Self  pregabalin  (LYRICA ) 100 MG capsule [id number] Yes Take 1 capsule (100 mg total) by mouth 2 (two) times daily.  Patient taking differently: Take 100 mg by mouth at bedtime as needed (pain.).   Lylia Sand, MD Taking Active Self  TURMERIC-GINGER PO [id number] Yes Take 3 capsules by mouth in the morning. With Garlic [provider] Taking Active Self  zinc sulfate, 50mg  elemental zinc, 220 (50 Zn) MG capsule [id number] Yes Take 220 mg by mouth daily after lunch. [provider] Taking Active Self  Med List Note Thaddeus Filippo, RPH-CPP 07/28/20 1202): Gleevec  (imatinib ) filled at [facility]            Recommendation:   Continue Current Plan of Care Talk with Care Management Social worker on 02/04/24 as planned Follow-up with PCP regarding wrist pain  Follow Up Plan:   Telephone follow up appointment date/time:  03/03/24 at 3:30  Michele Ahle, RN, BSN [place]  [facility] RN Care Manager Direct Dial : [phone]  Fax:  [phone]

## 2024-02-05 NOTE — Pre-Procedure Instructions (Signed)
 Surgical Instructions   Your procedure is scheduled on February 13, 2024. Report to [facility] Main Entrance A at 8:20 A.M., then check in with the Admitting office. Any questions or running late day of surgery: call [phone]  Questions prior to your surgery date: call [phone], Monday-Friday, 8am-4pm. If you experience any cold or flu symptoms such as cough, fever, chills, shortness of breath, etc. between now and your scheduled surgery, please notify us  at the above number.     Remember:  Do not eat or drink after midnight the night before your surgery   Take these medicines the morning of surgery with A SIP OF WATER: NONE   May take these medicines IF NEEDED: cyclobenzaprine  (FLEXERIL )  methocarbamol  (ROBAXIN )  pregabalin  (LYRICA )    Follow your surgeon's instructions on when to stop Aspirin.  If no instructions were given by your surgeon then you will need to call the office to get those instructions.     One week prior to surgery, STOP taking any Aleve, Naproxen, Ibuprofen , Motrin , Advil , Goody's, BC's, all herbal medications, fish oil, and non-prescription vitamins. This includes your medication: diclofenac  (VOLTAREN ) tablet    WHAT DO I DO ABOUT MY DIABETES MEDICATION?   STOP taking your dapagliflozin  propanediol (FARXIGA ) three days prior to surgery. Your last dose will be June 15th.     THE MORNING OF SURGERY, take 3.5 units of insulin  glargine (LANTUS  SOLOSTAR).   HOW TO MANAGE YOUR DIABETES BEFORE AND AFTER SURGERY  Why is it important to control my blood sugar before and after surgery? Improving blood sugar levels before and after surgery helps healing and can limit problems. A way of improving blood sugar control is eating a healthy diet by:  Eating less sugar and carbohydrates  Increasing activity/exercise  Talking with your doctor about reaching your blood sugar goals High blood sugars (greater than 180 mg/dL) can raise your risk of infections and  slow your recovery, so you will need to focus on controlling your diabetes during the weeks before surgery. Make sure that the doctor who takes care of your diabetes knows about your planned surgery including the date and location.  How do I manage my blood sugar before surgery? Check your blood sugar at least 4 times a day, starting 2 days before surgery, to make sure that the level is not too high or low.  Check your blood sugar the morning of your surgery when you wake up and every 2 hours until you get to the Short Stay unit.  If your blood sugar is less than 70 mg/dL, you will need to treat for low blood sugar: Do not take insulin . Treat a low blood sugar (less than 70 mg/dL) with  cup of clear juice (cranberry or apple), 4 glucose tablets, OR glucose gel. Recheck blood sugar in 15 minutes after treatment (to make sure it is greater than 70 mg/dL). If your blood sugar is not greater than 70 mg/dL on recheck, call [phone] for further instructions. Report your blood sugar to the short stay nurse when you get to Short Stay.  If you are admitted to the hospital after surgery: Your blood sugar will be checked by the staff and you will probably be given insulin  after surgery (instead of oral diabetes medicines) to make sure you have good blood sugar levels. The goal for blood sugar control after surgery is 80-180 mg/dL.  Do NOT Smoke (Tobacco/Vaping) for 24 hours prior to your procedure.  If you use a CPAP at night, you may bring your mask/headgear for your overnight stay.   You will be asked to remove any contacts, glasses, piercing's, hearing aid's, dentures/partials prior to surgery. Please bring cases for these items if needed.    Patients discharged the day of surgery will not be allowed to drive home, and someone needs to stay with them for 24 hours.  SURGICAL WAITING ROOM VISITATION Patients may have no more than 2 support people in the waiting area -  these visitors may rotate.   Pre-op nurse will coordinate an appropriate time for 1 ADULT support person, who may not rotate, to accompany patient in pre-op.  Children under the age of 71 must have an adult with them who is not the patient and must remain in the main waiting area with an adult.  If the patient needs to stay at the hospital during part of their recovery, the visitor guidelines for inpatient rooms apply.  Please refer to the [facility] website for the visitor guidelines for any additional information.   If you received a COVID test during your pre-op visit  it is requested that you wear a mask when out in public, stay away from anyone that may not be feeling well and notify your surgeon if you develop symptoms. If you have been in contact with anyone that has tested positive in the last 10 days please notify you surgeon.      Pre-operative 5 CHG Bathing Instructions   You can play a key role in reducing the risk of infection after surgery. Your skin needs to be as free of germs as possible. You can reduce the number of germs on your skin by washing with CHG (chlorhexidine  gluconate) soap before surgery. CHG is an antiseptic soap that kills germs and continues to kill germs even after washing.   DO NOT use if you have an allergy to chlorhexidine /CHG or antibacterial soaps. If your skin becomes reddened or irritated, stop using the CHG and notify one of our RNs at [phone].   Please shower with the CHG soap starting 4 days before surgery using the following schedule:     Please keep in mind the following:  DO NOT shave, including legs and underarms, starting the day of your first shower.   You may shave your face at any point before/day of surgery.  Place clean sheets on your bed the day you start using CHG soap. Use a clean washcloth (not used since being washed) for each shower. DO NOT sleep with pets once you start using the CHG.   CHG Shower Instructions:  Wash  your face and private area with normal soap. If you choose to wash your hair, wash first with your normal shampoo.  After you use shampoo/soap, rinse your hair and body thoroughly to remove shampoo/soap residue.  Turn the water OFF and apply about 3 tablespoons (45 ml) of CHG soap to a CLEAN washcloth.  Apply CHG soap ONLY FROM YOUR NECK DOWN TO YOUR TOES (washing for 3-5 minutes)  DO NOT use CHG soap on face, private areas, open wounds, or sores.  Pay special attention to the area where your surgery is being performed.  If you are having back surgery, having someone wash your back for you may be helpful. Wait 2 minutes after CHG soap is applied, then you may rinse off the CHG soap.  Pat dry with a  clean towel  Put on clean clothes/pajamas   If you choose to wear lotion, please use ONLY the CHG-compatible lotions that are listed below.  Additional instructions for the day of surgery: DO NOT APPLY any lotions, deodorants, cologne, or perfumes.   Do not bring valuables to the hospital. [facility] is not responsible for any belongings/valuables. Do not wear nail polish, gel polish, artificial nails, or any other type of covering on natural nails (fingers and toes) Do not wear jewelry or makeup Put on clean/comfortable clothes.  Please brush your teeth.  Ask your nurse before applying any prescription medications to the skin.     CHG Compatible Lotions   Aveeno Moisturizing lotion  Cetaphil Moisturizing Cream  Cetaphil Moisturizing Lotion  Clairol Herbal Essence Moisturizing Lotion, Dry Skin  Clairol Herbal Essence Moisturizing Lotion, Extra Dry Skin  Clairol Herbal Essence Moisturizing Lotion, Normal Skin  Curel Age Defying Therapeutic Moisturizing Lotion with Alpha Hydroxy  Curel Extreme Care Body Lotion  Curel Soothing Hands Moisturizing Hand Lotion  Curel Therapeutic Moisturizing Cream, Fragrance-Free  Curel Therapeutic Moisturizing Lotion, Fragrance-Free  Curel Therapeutic  Moisturizing Lotion, Original Formula  Eucerin Daily Replenishing Lotion  Eucerin Dry Skin Therapy Plus Alpha Hydroxy Crme  Eucerin Dry Skin Therapy Plus Alpha Hydroxy Lotion  Eucerin Original Crme  Eucerin Original Lotion  Eucerin Plus Crme Eucerin Plus Lotion  Eucerin TriLipid Replenishing Lotion  Keri Anti-Bacterial Hand Lotion  Keri Deep Conditioning Original Lotion Dry Skin Formula Softly Scented  Keri Deep Conditioning Original Lotion, Fragrance Free Sensitive Skin Formula  Keri Lotion Fast Absorbing Fragrance Free Sensitive Skin Formula  Keri Lotion Fast Absorbing Softly Scented Dry Skin Formula  Keri Original Lotion  Keri Skin Renewal Lotion Keri Silky Smooth Lotion  Keri Silky Smooth Sensitive Skin Lotion  Nivea Body Creamy Conditioning Oil  Nivea Body Extra Enriched Lotion  Nivea Body Original Lotion  Nivea Body Sheer Moisturizing Lotion Nivea Crme  Nivea Skin Firming Lotion  NutraDerm 30 Skin Lotion  NutraDerm Skin Lotion  NutraDerm Therapeutic Skin Cream  NutraDerm Therapeutic Skin Lotion  ProShield Protective Hand Cream  Provon moisturizing lotion  Please read over the following fact sheets that you were given.

## 2024-02-06 ENCOUNTER — Other Ambulatory Visit: Payer: Self-pay

## 2024-02-06 ENCOUNTER — Encounter (HOSPITAL_COMMUNITY)
Admission: RE | Admit: 2024-02-06 | Discharge: 2024-02-06 | Disposition: A | Discharge status: Home / Self Care | Source: Ambulatory Visit | Attending: Neurological Surgery | Admitting: Neurological Surgery

## 2024-02-06 ENCOUNTER — Encounter (HOSPITAL_COMMUNITY): Payer: Self-pay

## 2024-02-06 VITALS — BP 163/93 | HR 89 | Temp 98.8°F | Resp 17 | Ht 72.0 in | Wt 221.5 lb

## 2024-02-06 DIAGNOSIS — K7030 Alcoholic cirrhosis of liver without ascites: Secondary | ICD-10-CM | POA: Diagnosis not present

## 2024-02-06 DIAGNOSIS — Z794 Long term (current) use of insulin: Secondary | ICD-10-CM | POA: Diagnosis not present

## 2024-02-06 DIAGNOSIS — E119 Type 2 diabetes mellitus without complications: Secondary | ICD-10-CM | POA: Insufficient documentation

## 2024-02-06 DIAGNOSIS — Z01812 Encounter for preprocedural laboratory examination: Secondary | ICD-10-CM | POA: Diagnosis not present

## 2024-02-06 DIAGNOSIS — Z01818 Encounter for other preprocedural examination: Secondary | ICD-10-CM

## 2024-02-06 LAB — CBC
HCT: 40.4 % (ref 39.0–52.0)
Hemoglobin: 12.2 g/dL — ABNORMAL LOW (ref 13.0–17.0)
MCH: 23.9 pg — ABNORMAL LOW (ref 26.0–34.0)
MCHC: 30.2 g/dL (ref 30.0–36.0)
MCV: 79.1 fL — ABNORMAL LOW (ref 80.0–100.0)
Platelets: 360 10*3/uL (ref 150–400)
RBC: 5.11 MIL/uL (ref 4.22–5.81)
RDW: 15.3 % (ref 11.5–15.5)
WBC: 6.9 10*3/uL (ref 4.0–10.5)
nRBC: 0 % (ref 0.0–0.2)

## 2024-02-06 LAB — SURGICAL PCR SCREEN
MRSA, PCR: NEGATIVE
Staphylococcus aureus: NEGATIVE

## 2024-02-06 LAB — COMPREHENSIVE METABOLIC PANEL WITH GFR
ALT: 35 U/L (ref 0–44)
AST: 36 U/L (ref 15–41)
Albumin: 3.6 g/dL (ref 3.5–5.0)
Alkaline Phosphatase: 84 U/L (ref 38–126)
Anion gap: 10 (ref 5–15)
BUN: 13 mg/dL (ref 6–20)
CO2: 24 mmol/L (ref 22–32)
Calcium: 9.4 mg/dL (ref 8.9–10.3)
Chloride: 104 mmol/L (ref 98–111)
Creatinine, Ser: 0.86 mg/dL (ref 0.61–1.24)
GFR, Estimated: >60 mL/min (ref >60)
Glucose, Bld: 148 mg/dL — ABNORMAL HIGH (ref 70–99)
Potassium: 4.1 mmol/L (ref 3.5–5.1)
Sodium: 138 mmol/L (ref 135–145)
Total Bilirubin: 0.7 mg/dL (ref 0.0–1.2)
Total Protein: 6.3 g/dL — ABNORMAL LOW (ref 6.5–8.1)

## 2024-02-06 LAB — TYPE AND SCREEN
ABO/RH(D): B POS
Antibody Screen: NEGATIVE

## 2024-02-06 LAB — GLUCOSE, CAPILLARY: Glucose-Capillary: 153 mg/dL — ABNORMAL HIGH (ref 70–99)

## 2024-02-06 LAB — HEMOGLOBIN A1C
Hgb A1c MFr Bld: 6.6 % — ABNORMAL HIGH (ref 4.8–5.6)
Mean Plasma Glucose: 142.72 mg/dL

## 2024-02-06 NOTE — Progress Notes (Signed)
 PCP - Jolanda Nation, NP Cardiologist - denies  PPM/ICD - denies   Chest x-ray - denies EKG - 10/02/23 Stress Test - denies ECHO - denies Cardiac Cath - denies  Sleep Study - denies   Fasting Blood Sugar - 130-140 Checks Blood Sugar about once a week  Last dose of GLP1 agonist-  n/a   Blood Thinner Instructions: n/a Aspirin Instructions: f/u with surgeon  ERAS Protcol - no, NPO   COVID TEST- n/a   Anesthesia review: no  Patient denies shortness of breath, fever, cough and chest pain at PAT appointment   All instructions explained to the patient, with a verbal understanding of the material. Patient agrees to go over the instructions while at home for a better understanding.  The opportunity to ask questions was provided.

## 2024-02-08 ENCOUNTER — Other Ambulatory Visit: Payer: Self-pay | Admitting: General Practice

## 2024-02-08 ENCOUNTER — Other Ambulatory Visit: Payer: Self-pay

## 2024-02-08 DIAGNOSIS — R29898 Other symptoms and signs involving the musculoskeletal system: Secondary | ICD-10-CM

## 2024-02-10 ENCOUNTER — Other Ambulatory Visit (HOSPITAL_COMMUNITY): Payer: Self-pay

## 2024-02-10 ENCOUNTER — Other Ambulatory Visit: Payer: Self-pay

## 2024-02-10 MED ORDER — METHOCARBAMOL 500 MG PO TABS
500.0000 mg | ORAL_TABLET | Freq: Four times a day (QID) | ORAL | 0 refills | Status: DC | PRN
  Filled 2024-02-10: qty 30, 8d supply, fill #0

## 2024-02-10 NOTE — Telephone Encounter (Signed)
 LOV 0/23/25 for Fall  Last refill 11/20/23 #30 W/ 0 REFIILLS  NOV Nothing scheduled

## 2024-02-13 ENCOUNTER — Other Ambulatory Visit: Payer: Self-pay

## 2024-02-13 ENCOUNTER — Encounter (HOSPITAL_COMMUNITY): Payer: Self-pay | Admitting: Neurological Surgery

## 2024-02-13 ENCOUNTER — Encounter (HOSPITAL_COMMUNITY)
Admission: RE | Disposition: A | Discharge status: Home / Self Care | Payer: Self-pay | Source: Home / Self Care | Attending: Neurological Surgery

## 2024-02-13 ENCOUNTER — Ambulatory Visit (HOSPITAL_COMMUNITY): Payer: Self-pay | Admitting: Anesthesiology

## 2024-02-13 ENCOUNTER — Ambulatory Visit (HOSPITAL_BASED_OUTPATIENT_CLINIC_OR_DEPARTMENT_OTHER): Payer: Self-pay | Admitting: Anesthesiology

## 2024-02-13 ENCOUNTER — Ambulatory Visit (HOSPITAL_COMMUNITY)

## 2024-02-13 ENCOUNTER — Observation Stay (HOSPITAL_COMMUNITY)
Admission: RE | Admit: 2024-02-13 | Discharge: 2024-02-14 | Disposition: A | Discharge status: Home / Self Care | Attending: Neurological Surgery | Admitting: Neurological Surgery

## 2024-02-13 DIAGNOSIS — M50022 Cervical disc disorder at C5-C6 level with myelopathy: Secondary | ICD-10-CM | POA: Diagnosis not present

## 2024-02-13 DIAGNOSIS — M50021 Cervical disc disorder at C4-C5 level with myelopathy: Secondary | ICD-10-CM | POA: Diagnosis not present

## 2024-02-13 DIAGNOSIS — Z79899 Other long term (current) drug therapy: Secondary | ICD-10-CM | POA: Insufficient documentation

## 2024-02-13 DIAGNOSIS — Z794 Long term (current) use of insulin: Secondary | ICD-10-CM

## 2024-02-13 DIAGNOSIS — M5412 Radiculopathy, cervical region: Secondary | ICD-10-CM | POA: Diagnosis not present

## 2024-02-13 DIAGNOSIS — E119 Type 2 diabetes mellitus without complications: Secondary | ICD-10-CM | POA: Diagnosis not present

## 2024-02-13 DIAGNOSIS — Z87891 Personal history of nicotine dependence: Secondary | ICD-10-CM | POA: Diagnosis not present

## 2024-02-13 DIAGNOSIS — Z85828 Personal history of other malignant neoplasm of skin: Secondary | ICD-10-CM | POA: Diagnosis not present

## 2024-02-13 DIAGNOSIS — Z7982 Long term (current) use of aspirin: Secondary | ICD-10-CM | POA: Diagnosis not present

## 2024-02-13 DIAGNOSIS — M5012 Mid-cervical disc disorder, unspecified level: Secondary | ICD-10-CM | POA: Diagnosis not present

## 2024-02-13 DIAGNOSIS — I10 Essential (primary) hypertension: Secondary | ICD-10-CM | POA: Insufficient documentation

## 2024-02-13 DIAGNOSIS — G959 Disease of spinal cord, unspecified: Principal | ICD-10-CM | POA: Diagnosis present

## 2024-02-13 DIAGNOSIS — Z981 Arthrodesis status: Secondary | ICD-10-CM | POA: Diagnosis not present

## 2024-02-13 DIAGNOSIS — M50023 Cervical disc disorder at C6-C7 level with myelopathy: Secondary | ICD-10-CM | POA: Insufficient documentation

## 2024-02-13 DIAGNOSIS — M5002 Cervical disc disorder with myelopathy, mid-cervical region, unspecified level: Secondary | ICD-10-CM | POA: Diagnosis not present

## 2024-02-13 HISTORY — PX: ANTERIOR CERVICAL DECOMPRESSION/DISCECTOMY FUSION 4 LEVELS: SHX5556

## 2024-02-13 LAB — GLUCOSE, CAPILLARY
Glucose-Capillary: 108 mg/dL — ABNORMAL HIGH (ref 70–99)
Glucose-Capillary: 107 mg/dL — ABNORMAL HIGH (ref 70–99)
Glucose-Capillary: 135 mg/dL — ABNORMAL HIGH (ref 70–99)
Glucose-Capillary: 157 mg/dL — ABNORMAL HIGH (ref 70–99)
Glucose-Capillary: 173 mg/dL — ABNORMAL HIGH (ref 70–99)

## 2024-02-13 SURGERY — ANTERIOR CERVICAL DECOMPRESSION/DISCECTOMY FUSION 4 LEVELS
Anesthesia: General | Site: Spine Cervical

## 2024-02-13 MED ORDER — OXYCODONE HCL 5 MG PO TABS: 5.0000 mg | ORAL_TABLET | Freq: Once | ORAL | Status: DC | PRN

## 2024-02-13 MED ORDER — SODIUM CHLORIDE 0.9 % IV SOLN: 250.0000 mL | INTRAVENOUS | Status: DC

## 2024-02-13 MED ORDER — PHENOL 1.4 % MT LIQD
1.0000 | OROMUCOSAL | Status: DC | PRN
  Filled 2024-02-13: qty 177

## 2024-02-13 MED ORDER — MENTHOL 3 MG MT LOZG
1.0000 | LOZENGE | OROMUCOSAL | Status: DC | PRN
  Filled 2024-02-13: qty 9

## 2024-02-13 MED ORDER — METHOCARBAMOL 500 MG PO TABS
500.0000 mg | ORAL_TABLET | Freq: Four times a day (QID) | ORAL | Status: DC | PRN
  Administered 2024-02-13 – 2024-02-14 (×2): 500 mg via ORAL
  Filled 2024-02-13 (×2): qty 1

## 2024-02-13 MED ORDER — LIDOCAINE-EPINEPHRINE 1 %-1:100000 IJ SOLN
INTRAMUSCULAR | Status: DC | PRN
  Administered 2024-02-13: 10 mL

## 2024-02-13 MED ORDER — MIDAZOLAM HCL 2 MG/2ML IJ SOLN
INTRAMUSCULAR | Status: AC
  Filled 2024-02-13: qty 2

## 2024-02-13 MED ORDER — DAPAGLIFLOZIN PROPANEDIOL 5 MG PO TABS
5.0000 mg | ORAL_TABLET | Freq: Every day | ORAL | Status: DC
  Administered 2024-02-14: 5 mg via ORAL
  Filled 2024-02-13: qty 1

## 2024-02-13 MED ORDER — INSULIN PEN NEEDLE 32G X 4 MM MISC: 32.0000 g | Freq: Every day | Status: DC

## 2024-02-13 MED ORDER — ORAL CARE MOUTH RINSE: 15.0000 mL | Freq: Once | OROMUCOSAL | Status: AC

## 2024-02-13 MED ORDER — KETAMINE HCL 10 MG/ML IJ SOLN: INTRAMUSCULAR | Status: DC | PRN

## 2024-02-13 MED ORDER — PROPOFOL 10 MG/ML IV BOLUS
INTRAVENOUS | Status: AC
Start: 2024-02-13 — End: 2024-02-13
  Filled 2024-02-13: qty 20

## 2024-02-13 MED ORDER — LIDOCAINE 2% (20 MG/ML) 5 ML SYRINGE: INTRAMUSCULAR | Status: DC | PRN

## 2024-02-13 MED ORDER — FENTANYL CITRATE (PF) 100 MCG/2ML IJ SOLN
INTRAMUSCULAR | Status: AC
Start: 2024-02-13 — End: 2024-02-13
  Filled 2024-02-13: qty 2

## 2024-02-13 MED ORDER — KETAMINE HCL 50 MG/5ML IJ SOSY
PREFILLED_SYRINGE | INTRAMUSCULAR | Status: AC
  Filled 2024-02-13: qty 5

## 2024-02-13 MED ORDER — FENTANYL CITRATE (PF) 250 MCG/5ML IJ SOLN
INTRAMUSCULAR | Status: AC
  Filled 2024-02-13: qty 5

## 2024-02-13 MED ORDER — METHOCARBAMOL 1000 MG/10ML IJ SOLN: 500.0000 mg | Freq: Four times a day (QID) | INTRAMUSCULAR | Status: DC | PRN

## 2024-02-13 MED ORDER — PHENYLEPHRINE HCL-NACL 20-0.9 MG/250ML-% IV SOLN: INTRAVENOUS | Status: DC | PRN

## 2024-02-13 MED ORDER — POLYETHYLENE GLYCOL 3350 17 G PO PACK: 17.0000 g | PACK | Freq: Every day | ORAL | Status: DC | PRN

## 2024-02-13 MED ORDER — THROMBIN 5000 UNITS EX SOLR: OROMUCOSAL | Status: DC | PRN

## 2024-02-13 MED ORDER — OXYCODONE-ACETAMINOPHEN 5-325 MG PO TABS
1.0000 | ORAL_TABLET | ORAL | Status: DC | PRN
  Administered 2024-02-13 – 2024-02-14 (×3): 2 via ORAL
  Filled 2024-02-13 (×3): qty 2

## 2024-02-13 MED ORDER — DEXMEDETOMIDINE HCL IN NACL 80 MCG/20ML IV SOLN: INTRAVENOUS | Status: DC | PRN

## 2024-02-13 MED ORDER — AMISULPRIDE (ANTIEMETIC) 5 MG/2ML IV SOLN: 10.0000 mg | Freq: Once | INTRAVENOUS | Status: DC | PRN

## 2024-02-13 MED ORDER — CEFAZOLIN SODIUM-DEXTROSE 2-4 GM/100ML-% IV SOLN
2.0000 g | Freq: Three times a day (TID) | INTRAVENOUS | Status: AC
  Administered 2024-02-13 (×2): 2 g via INTRAVENOUS
  Filled 2024-02-13 (×2): qty 100

## 2024-02-13 MED ORDER — CHLORHEXIDINE GLUCONATE CLOTH 2 % EX PADS: 6.0000 | MEDICATED_PAD | Freq: Once | CUTANEOUS | Status: DC

## 2024-02-13 MED ORDER — OXYCODONE HCL 5 MG/5ML PO SOLN: 5.0000 mg | Freq: Once | ORAL | Status: DC | PRN

## 2024-02-13 MED ORDER — 0.9 % SODIUM CHLORIDE (POUR BTL) OPTIME
TOPICAL | Status: DC | PRN
  Administered 2024-02-13: 1000 mL

## 2024-02-13 MED ORDER — HYDROMORPHONE HCL 1 MG/ML IJ SOLN
INTRAMUSCULAR | Status: AC
  Filled 2024-02-13: qty 0.5

## 2024-02-13 MED ORDER — CEFAZOLIN SODIUM-DEXTROSE 2-4 GM/100ML-% IV SOLN
2.0000 g | INTRAVENOUS | Status: AC
  Filled 2024-02-13: qty 100

## 2024-02-13 MED ORDER — ALUM & MAG HYDROXIDE-SIMETH 200-200-20 MG/5ML PO SUSP: 30.0000 mL | Freq: Four times a day (QID) | ORAL | Status: DC | PRN

## 2024-02-13 MED ORDER — SODIUM CHLORIDE 0.9 % IV SOLN: INTRAVENOUS | Status: DC | PRN

## 2024-02-13 MED ORDER — ACETAMINOPHEN 650 MG RE SUPP: 650.0000 mg | RECTAL | Status: DC | PRN

## 2024-02-13 MED ORDER — DEXAMETHASONE SODIUM PHOSPHATE 10 MG/ML IJ SOLN: INTRAMUSCULAR | Status: DC | PRN

## 2024-02-13 MED ORDER — LIDOCAINE-EPINEPHRINE 1 %-1:100000 IJ SOLN
INTRAMUSCULAR | Status: AC
Start: 2024-02-13 — End: 2024-02-13
  Filled 2024-02-13: qty 1

## 2024-02-13 MED ORDER — FENTANYL CITRATE (PF) 250 MCG/5ML IJ SOLN: INTRAMUSCULAR | Status: DC | PRN

## 2024-02-13 MED ORDER — BISACODYL 10 MG RE SUPP: 10.0000 mg | Freq: Every day | RECTAL | Status: DC | PRN

## 2024-02-13 MED ORDER — MIDAZOLAM HCL 2 MG/2ML IJ SOLN: INTRAMUSCULAR | Status: DC | PRN

## 2024-02-13 MED ORDER — THROMBIN 5000 UNITS EX KIT
PACK | CUTANEOUS | Status: AC
  Filled 2024-02-13: qty 2

## 2024-02-13 MED ORDER — PROPOFOL 10 MG/ML IV BOLUS: INTRAVENOUS | Status: DC | PRN

## 2024-02-13 MED ORDER — ONDANSETRON HCL 4 MG PO TABS: 4.0000 mg | ORAL_TABLET | Freq: Four times a day (QID) | ORAL | Status: DC | PRN

## 2024-02-13 MED ORDER — BUPIVACAINE HCL (PF) 0.5 % IJ SOLN
INTRAMUSCULAR | Status: AC
  Filled 2024-02-13: qty 30

## 2024-02-13 MED ORDER — FLEET ENEMA RE ENEM: 1.0000 | ENEMA | Freq: Once | RECTAL | Status: DC | PRN

## 2024-02-13 MED ORDER — INSULIN GLARGINE-YFGN 100 UNIT/ML ~~LOC~~ SOLN
7.0000 [IU] | Freq: Every day | SUBCUTANEOUS | Status: DC
  Administered 2024-02-14: 7 [IU] via SUBCUTANEOUS
  Filled 2024-02-13: qty 0.07

## 2024-02-13 MED ORDER — HYDROMORPHONE HCL 1 MG/ML IJ SOLN: INTRAMUSCULAR | Status: DC | PRN

## 2024-02-13 MED ORDER — ROCURONIUM BROMIDE 10 MG/ML (PF) SYRINGE
PREFILLED_SYRINGE | INTRAVENOUS | Status: DC | PRN
  Administered 2024-02-13: 20 mg via INTRAVENOUS

## 2024-02-13 MED ORDER — PHENYLEPHRINE 80 MCG/ML (10ML) SYRINGE FOR IV PUSH (FOR BLOOD PRESSURE SUPPORT): PREFILLED_SYRINGE | INTRAVENOUS | Status: DC | PRN

## 2024-02-13 MED ORDER — ALBUMIN HUMAN 5 % IV SOLN: INTRAVENOUS | Status: DC | PRN

## 2024-02-13 MED ORDER — SUGAMMADEX SODIUM 200 MG/2ML IV SOLN: INTRAVENOUS | Status: DC | PRN

## 2024-02-13 MED ORDER — SODIUM CHLORIDE 0.9% FLUSH
3.0000 mL | Freq: Two times a day (BID) | INTRAVENOUS | Status: DC
  Administered 2024-02-13: 3 mL via INTRAVENOUS

## 2024-02-13 MED ORDER — DIPHENHYDRAMINE HCL 25 MG PO CAPS
25.0000 mg | ORAL_CAPSULE | Freq: Four times a day (QID) | ORAL | Status: DC | PRN
  Administered 2024-02-13 – 2024-02-14 (×2): 25 mg via ORAL
  Filled 2024-02-13 (×2): qty 1

## 2024-02-13 MED ORDER — OXYCODONE-ACETAMINOPHEN 5-325 MG PO TABS
1.0000 | ORAL_TABLET | Freq: Four times a day (QID) | ORAL | Status: DC | PRN
  Administered 2024-02-13: 2 via ORAL
  Filled 2024-02-13: qty 2

## 2024-02-13 MED ORDER — HYDROMORPHONE HCL 1 MG/ML IJ SOLN: 0.5000 mg | INTRAMUSCULAR | Status: DC | PRN

## 2024-02-13 MED ORDER — DOCUSATE SODIUM 100 MG PO CAPS
100.0000 mg | ORAL_CAPSULE | Freq: Two times a day (BID) | ORAL | Status: DC
  Administered 2024-02-13: 100 mg via ORAL
  Filled 2024-02-13: qty 1

## 2024-02-13 MED ORDER — ONDANSETRON HCL 4 MG/2ML IJ SOLN: 4.0000 mg | Freq: Four times a day (QID) | INTRAMUSCULAR | Status: DC | PRN

## 2024-02-13 MED ORDER — SENNA 8.6 MG PO TABS
1.0000 | ORAL_TABLET | Freq: Two times a day (BID) | ORAL | Status: DC
  Administered 2024-02-13: 8.6 mg via ORAL
  Filled 2024-02-13: qty 1

## 2024-02-13 MED ORDER — SODIUM CHLORIDE 0.9% FLUSH: 3.0000 mL | INTRAVENOUS | Status: DC | PRN

## 2024-02-13 MED ORDER — LACTATED RINGERS IV SOLN: INTRAVENOUS | Status: DC

## 2024-02-13 MED ORDER — CHLORHEXIDINE GLUCONATE 0.12 % MT SOLN
15.0000 mL | Freq: Once | OROMUCOSAL | Status: AC
  Administered 2024-02-13: 15 mL via OROMUCOSAL
  Filled 2024-02-13: qty 15

## 2024-02-13 MED ORDER — PREGABALIN 100 MG PO CAPS
100.0000 mg | ORAL_CAPSULE | Freq: Every evening | ORAL | Status: DC | PRN
  Administered 2024-02-13: 100 mg via ORAL
  Filled 2024-02-13: qty 1

## 2024-02-13 MED ORDER — FENTANYL CITRATE (PF) 100 MCG/2ML IJ SOLN
25.0000 ug | INTRAMUSCULAR | Status: DC | PRN
  Administered 2024-02-13: 50 ug via INTRAVENOUS

## 2024-02-13 MED ORDER — INSULIN ASPART 100 UNIT/ML IJ SOLN: 0.0000 [IU] | INTRAMUSCULAR | Status: DC | PRN

## 2024-02-13 MED ORDER — ACETAMINOPHEN 325 MG PO TABS: 650.0000 mg | ORAL_TABLET | ORAL | Status: DC | PRN

## 2024-02-13 SURGICAL SUPPLY — 53 items
ALLOGRFT BNE OSSIFUSE FBR 5CC (Bone Implant) IMPLANT
BAG COUNTER SPONGE SURGICOUNT (BAG) ×1 IMPLANT
BAND RUBBER #18 3X1/16 STRL (MISCELLANEOUS) ×2 IMPLANT
BIT DRILL ACP 15 (DRILL) IMPLANT
BIT DRILL NEURO 2X3.1 SFT TUCH (MISCELLANEOUS) ×1 IMPLANT
BNDG GAUZE DERMACEA FLUFF 4 (GAUZE/BANDAGES/DRESSINGS) IMPLANT
BUR BARREL STRAIGHT FLUTE 4.0 (BURR) IMPLANT
CAGE CERV MOD 6X17X14 7D (Cage) IMPLANT
CAGE CERV MOD 7X17X14 7D (Cage) IMPLANT
CANISTER SUCTION 3000ML PPV (SUCTIONS) ×1 IMPLANT
DERMABOND ADVANCED .7 DNX12 (GAUZE/BANDAGES/DRESSINGS) ×1 IMPLANT
DRAPE LAPAROTOMY 100X72 PEDS (DRAPES) ×1 IMPLANT
DRAPE MICROSCOPE SLANT 54X150 (MISCELLANEOUS) IMPLANT
DURAPREP 6ML APPLICATOR 50/CS (WOUND CARE) ×1 IMPLANT
ELECT COATED BLADE 2.86 ST (ELECTRODE) ×1 IMPLANT
ELECTRODE REM PT RTRN 9FT ADLT (ELECTROSURGICAL) ×1 IMPLANT
EVACUATOR 1/8 PVC DRAIN (DRAIN) IMPLANT
GAUZE 4X4 16PLY ~~LOC~~+RFID DBL (SPONGE) IMPLANT
GAUZE SPONGE 4X4 12PLY STRL (GAUZE/BANDAGES/DRESSINGS) IMPLANT
GLOVE BIOGEL PI IND STRL 8.5 (GLOVE) ×1 IMPLANT
GLOVE ECLIPSE 8.5 STRL (GLOVE) ×1 IMPLANT
GLOVE EXAM NITRILE XL STR (GLOVE) IMPLANT
GLOVE SURG SS PI 7.5 STRL IVOR (GLOVE) IMPLANT
GOWN STRL REUS W/ TWL LRG LVL3 (GOWN DISPOSABLE) IMPLANT
GOWN STRL REUS W/ TWL XL LVL3 (GOWN DISPOSABLE) ×1 IMPLANT
GOWN STRL REUS W/TWL 2XL LVL3 (GOWN DISPOSABLE) ×1 IMPLANT
HALTER HD/CHIN CERV TRACTION D (MISCELLANEOUS) ×1 IMPLANT
HEMOSTAT POWDER KIT SURGIFOAM (HEMOSTASIS) ×1 IMPLANT
KIT BASIN OR (CUSTOM PROCEDURE TRAY) ×1 IMPLANT
KIT TURNOVER KIT B (KITS) ×1 IMPLANT
NDL HYPO 22X1.5 SAFETY MO (MISCELLANEOUS) ×1 IMPLANT
NDL SPNL 22GX3.5 QUINCKE BK (NEEDLE) ×1 IMPLANT
NEEDLE HYPO 22X1.5 SAFETY MO (MISCELLANEOUS) ×1 IMPLANT
NEEDLE SPNL 22GX3.5 QUINCKE BK (NEEDLE) ×1 IMPLANT
NS IRRIG 1000ML POUR BTL (IV SOLUTION) ×1 IMPLANT
PACK LAMINECTOMY NEURO (CUSTOM PROCEDURE TRAY) ×1 IMPLANT
PAD ARMBOARD POSITIONER FOAM (MISCELLANEOUS) ×3 IMPLANT
PATTIES SURGICAL .5 X.5 (GAUZE/BANDAGES/DRESSINGS) ×1 IMPLANT
PATTIES SURGICAL .5 X1 (DISPOSABLE) ×1 IMPLANT
PATTIES SURGICAL 1X1 (DISPOSABLE) ×1 IMPLANT
PIN ACP TEMP FIXATION (EXFIX) IMPLANT
PLATE ACP 2.1X74 4LVL (Plate) IMPLANT
SCREW ACP VA ST 3.5X15 (Screw) IMPLANT
SET WALTER ACTIVATION W/DRAPE (SET/KITS/TRAYS/PACK) ×1 IMPLANT
SPIKE FLUID TRANSFER (MISCELLANEOUS) ×1 IMPLANT
SPONGE INTESTINAL PEANUT (DISPOSABLE) ×1 IMPLANT
SUT VIC AB 4-0 RB1 18 (SUTURE) ×2 IMPLANT
SYR 30ML SLIP (SYRINGE) ×1 IMPLANT
TAPE CLOTH SURG 4X10 WHT LF (GAUZE/BANDAGES/DRESSINGS) IMPLANT
TOWEL GREEN STERILE (TOWEL DISPOSABLE) ×1 IMPLANT
TOWEL GREEN STERILE FF (TOWEL DISPOSABLE) ×1 IMPLANT
TUBING FEATHERFLOW (TUBING) ×1 IMPLANT
WATER STERILE IRR 1000ML POUR (IV SOLUTION) ×1 IMPLANT

## 2024-02-13 NOTE — H&P (Signed)
 Gerald Olson is an 58 y.o. male.   Chief Complaint: Bilateral upper extremity weakness right worse than left HPI: Patient is a 58 year old individual whose had significant weakness in the upper extremities he has been having marked weakness distally in the right hand with inability to grip and ability to extend his wrist and difficulty raising his arm 3 days featuring weakness in the deltoid bicep wrist extensor and tricep grip and intrinsic.  He has also had some early weakness in the left upper extremity making his grip strength in the substantially diminished.  An MRI of the cervical spine demonstrates the patient has advanced spondylitic disease at C4-5 C5-6 C6-C7 with the AP dimension of the canal measuring 7-1/2 mm at best and most of it 7 mm or less.  Because of the cord compression been advised regarding the need for surgical decompression at multiple levels including C4-5 C5-6 C6-7 and C7-T1.  He is now admitted for this procedure.  Past Medical History:  Diagnosis Date   ADHD (attention deficit hyperactivity disorder)    Diagnosed during teenage years   Alcohol use disorder    On average 14-21 drinks per week   Alcoholic cirrhosis of liver    Per patient - condition is not related to moderate alcohol use but rather prolonged NSAID use and recent abuse.   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[place]   Major depressive disorder    Other spondylosis with radiculopathy, cervical region    Retinopathy    Right eye; per patient report   Type 2 diabetes mellitus with hyperglycemia      Past Surgical History:  Procedure Laterality Date   CHOLECYSTECTOMY N/A 06/02/2018   Procedure: LAPAROSCOPIC CHOLECYSTECTOMY;  Surgeon: Franki Isles, MD;  Location: [facility] ORS;  Service: General;  Laterality: N/A;   KNEE ARTHROSCOPY Right    30 Years ago   lumbar five-sacral one posterior lumbar interbody fusion  01/24/2021   LUMBAR LAMINECTOMY/DECOMPRESSION MICRODISCECTOMY Bilateral 10/07/2023   Procedure: Laminectomy for Facet/Synovial Cyst - Lumbar Four-Lumbar Five - Bilateral;  Surgeon: Elna Haggis, MD;  Location: [facility] OR;  Service: Neurosurgery;  Laterality: Bilateral;  C3    Family History  Problem Relation Age of Onset   Aneurysm Father    Drug abuse Sister    Multiple sclerosis Sister    Suicidality Maternal Aunt    Emphysema Maternal Grandfather    Diabetes Neg Hx    Social History:  reports that he quit smoking about 20 years ago. His smoking use included cigarettes. He has never used smokeless tobacco. He reports that he does not currently use alcohol after a past usage of about 42.0 - 56.0 standard drinks of alcohol per week. He reports current drug use. Drug: Marijuana.  Allergies: No Known Allergies  Medications Prior to Admission  Medication Sig Dispense Refill   ALPHA LIPOIC ACID PO Take 4 capsules by mouth daily with lunch.     aspirin 325 MG tablet Take 1,300 mg by mouth every 8 (eight) hours  as needed.     BLACK CURRANT SEED OIL PO Take 1 capsule by mouth daily with lunch.     Cholecalciferol (VITAMIN D -3 PO) Take 5,000 Units by mouth daily with lunch.     cyclobenzaprine  (FLEXERIL ) 5 MG tablet Take 1 tablet (5 mg total) by mouth 2 (two) times daily as needed for muscle spasms. 20 tablet 0   dapagliflozin  propanediol (FARXIGA ) 5 MG TABS tablet Take 1 tablet (5 mg total) by mouth daily before breakfast. 90 tablet 3   diclofenac  (VOLTAREN ) 50 MG EC tablet Take 50 mg by mouth daily as needed (pain).     ibuprofen  (ADVIL ) 200 MG tablet Take 800-1,200 mg by  mouth 2 (two) times daily as needed (pain.).     insulin  glargine (LANTUS  SOLOSTAR) 100 UNIT/ML Solostar Pen Inject 7 Units into the skin every morning. 15 mL 3   MAGNESIUM COMPLEX HIGH POTENCY PO Take 1 capsule by mouth in the morning.     Multiple Vitamins-Minerals (ALIVE MENS GUMMY MULTIVITAMINS PO) Take 4 each by mouth in the morning.     naproxen sodium (ALEVE) 220 MG tablet Take 660 mg by mouth 2 (two) times daily as needed (pain.).     OVER THE COUNTER MEDICATION Take 5 mLs by mouth every 3 (three) hours as needed (pain). Wild Lettuce Tincture     pregabalin  (LYRICA ) 100 MG capsule Take 1 capsule (100 mg total) by mouth 2 (two) times daily. (Patient taking differently: Take 100 mg by mouth at bedtime as needed (pain.).) 60 capsule 2   TURMERIC-GINGER PO Take 3 capsules by mouth in the morning. With Garlic     zinc sulfate, 50mg  elemental zinc, 220 (50 Zn) MG capsule Take 220 mg by mouth daily after lunch.     glucose blood (ACCU-CHEK GUIDE) test strip USE AS DIRECTED 2 TIMES DAILY 180 strip 0   Insulin  Pen Needle (TECHLITE PEN NEEDLES) 32G X 4 MM MISC Use to inject Victoza  once a day 100 each 11   methocarbamol  (ROBAXIN ) 500 MG tablet Take 1 tablet (500 mg total) by mouth every 6 (six) hours as needed for muscle spasms. 30 tablet 0    No results found for this or any previous visit (from the past 48 hours). No results found.  Review of Systems  Constitutional:  Positive for activity change.  Musculoskeletal:  Positive for gait problem, myalgias and neck pain.  Neurological:  Positive for weakness and numbness.    Blood pressure (!) 161/90, pulse 85, temperature 98.3 F (36.8 C), temperature source Oral, resp. rate 18, height 6' 2.5 (1.892 m), weight 98.9 kg, SpO2 97%. Physical Exam Constitutional:      Appearance: Normal appearance. He is normal weight.  HENT:     Head: Normocephalic and atraumatic.     Right Ear: Tympanic membrane, ear canal and external ear normal.     Left  Ear: Tympanic membrane, ear canal and external ear normal.     Nose: Nose normal.     Mouth/Throat:     Mouth: Mucous membranes are moist.     Pharynx: Oropharynx is clear.   Eyes:     Extraocular Movements: Extraocular movements intact.     Conjunctiva/sclera: Conjunctivae normal.     Pupils: Pupils are equal, round, and reactive to light.   Neck:     Comments: Decreased range of motion with positive Lhermitte sign slight extension.  Able to turn 30 degrees left and 30 degrees right very reluctantly flexes only 10 degrees.  Cardiovascular:     Rate and Rhythm: Normal rate and regular rhythm.     Pulses: Normal pulses.     Heart sounds: Normal heart sounds.  Pulmonary:     Effort: Pulmonary effort is normal.     Breath sounds: Normal breath sounds.  Abdominal:     General: Abdomen is flat.     Palpations: Abdomen is soft.   Musculoskeletal:     Comments: Marked weakness in right upper extremity with atrophy in the intrinsics on the right hand minimal atrophy in the left intrinsics.   Neurological:     Mental Status: He is alert.     Comments: Marked weakness of the right upper extremity in the deltoid bicep tricep grip and intrinsic on the right all graded at 3 out of 5 to 2 out of 5 in the intrinsics.  Sensation is diminished with some hyperesthesia in the right upper extremity left upper extremity sensation appears more intact although there is some diminishment of pin sensation distally in the upper extremity  Psychiatric:        Mood and Affect: Mood normal.        Behavior: Behavior normal.        Thought Content: Thought content normal.        Judgment: Judgment normal.      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  Plan: Anterior decompression arthrodesis C4-5 C5-6 C6-7 C7-T1.  Sela Daft, MD 02/13/2024, 8:24 AM

## 2024-02-13 NOTE — Plan of Care (Signed)

## 2024-02-13 NOTE — Anesthesia Procedure Notes (Addendum)
 Procedure Name: Intubation Date/Time: 02/13/2024 11:44 AM  Performed by: Hebert Littler, CRNAPre-anesthesia Checklist: Patient identified, Emergency Drugs available, Suction available and Patient being monitored Patient Re-evaluated:Patient Re-evaluated prior to induction Oxygen Delivery Method: Circle System Utilized Preoxygenation: Pre-oxygenation with 100% oxygen Induction Type: IV induction Ventilation: Mask ventilation without difficulty and Oral airway inserted - appropriate to patient size Laryngoscope Size: Glidescope and 4 Grade View: Grade I Tube type: Oral Tube size: 7.5 mm Number of attempts: 1 Airway Equipment and Method: Stylet, Oral airway, Video-laryngoscopy and Rigid stylet Placement Confirmation: ETT inserted through vocal cords under direct vision, positive ETCO2 and breath sounds checked- equal and bilateral Secured at: 21 cm Tube secured with: Tape Dental Injury: Teeth and Oropharynx as per pre-operative assessment

## 2024-02-13 NOTE — Op Note (Signed)
 Date of surgery: 02/13/2024 Preoperative diagnosis: Focal myelopathy C4-5 C5-6 C6-7 C7-T1 with cervical radiculopathy Postoperative diagnosis: Same Procedure: Anterior cervical decompression C4-5 C5-6 C6-C7 C7-T1 arthrodesis with titanium spacer and allograft and autograft morselized anterior plate fixation [id number] Surgeon: Elna Haggis First Assistant: Dr. Arvilla Birmingham Anesthesia: General endotracheal Indications: The patient is a 58 year old individual's progressively developed significant weakness in the right upper extremity and now is developing weakness in the left upper extremity he has advanced spondylitic disease at multiple levels including cord compression at C4-5 C5-6 C6-7 and C7-T1.  Has been advised regarding the need for surgical decompression at multiple levels on the snout taken to the operating room.  Procedure: Patient brought the operating room supine on the stretcher after the smooth induction of general tracheal anesthesia neck was placed in 5 pounds of halter traction the shoulders were taped in position and the neck was prepped with alcohol DuraPrep and draped in a sterile fashion a transverse incision was made low in the neck above the collarbone and the dissection was carried down through the platysma.  The plane between the sternocleidomastoid and the strap muscles dissected bluntly during this time Dr. Arvilla Birmingham helped to expose obtain the exposure which would be sensitive from C4 to include T1 the inferior portion of the exposure was the facilitated by Dr. Andy Bannister with retraction and dissection as we were able to expose down to the T1 interspace and below.  Then ventral osteophytes were removed from the C5-6 and the C4-5 levels and this was facilitated by Dr. Linn Rich residents retracting and also using a high-speed drill to remove the osteophytic overgrowth.  Thus each of the disc spaces was then isolated C4-5 C5-6 C6-7 and C7-T1.  Next discectomies were performed for  starting at C4-5 significant degenerative changes noted in the disc space and that the space was gradually opened to remove the uncinate processes at C4-5 on the left and on the right path of the C5 nerve root was cleared largely and laterally.  Once the decompression was completed the interspace was sized for an appropriate size spacer and was felt that a 7 x 17 x 14 mm spacer with 7 degrees of lordosis would fit best into this interval this was filled with a combination of bits of autograft from the patient's discectomy in addition to allograft in the form of demineralized bone matrix.  Once the space was isolated the procedure was repeated then at C5-6 C6-7 and C7-T1 at C5-6 a 7 x 17 x 14 mm spacer with 7 degrees lordosis was used at C6-7 and C7-T1 6 x 17 x 14 mm spacers with 7 degrees of lordosis were used.  Once the decompression was completed and the spacers were placed additional autograft mixed with allograft was packed into the lateral gutters where the uncinate processes had been removed and used for bone graft to allow good decompression of the foraminal zones.  Next an anterior plate was fitted and this was a 74 mm ACP plate using a total of ten 3-1/2 x 15 mm screws that were self-tapping.  Once the hardware and construct was completed lateral x-ray was obtained demonstrating good fixation with the hardware at the upper 3 levels however C7-T1 could not be visualized despite multiple attempts.  Once this was completed the area was inspected visually additional bone graft was packed into the lateral gutters as tolerated and then the wound was checked for hemostasis and closed over a small Hemovac drain brought out through separate stab incision.  The wound was closed with 4-0 Vicryl in the platysma and 4-0 Vicryl in a subcuticular skin blood loss for the procedure was estimated at 100 cc.  Patient tolerated procedure well was returned to recovery room in stable condition.

## 2024-02-13 NOTE — Progress Notes (Signed)
 Orthopedic Tech Progress Note Patient Details:  Gerald Olson 02/10/1966 [id number]  Ortho Devices Type of Ortho Device: Soft collar Ortho Device/Splint Location: Neck Ortho Device/Splint Interventions: Ordered      Gerald Olson 02/13/2024, 5:40 PM

## 2024-02-13 NOTE — Anesthesia Postprocedure Evaluation (Signed)
 Anesthesia Post Note  Patient: Gerald Olson  Procedure(s) Performed: ANTERIOR CERVICAL DECOMPRESSION/DISCECTOMY FUSION, CERVICAL FOUR - CERVICAL FIVE, CERVICAL FIVE - CERVICAL SIX, CERVICAL SIX - CERVICAL SEVEN, CERVICAL SEVEN - THORACIC ONE (Spine Cervical)     Patient location during evaluation: PACU Anesthesia Type: General Level of consciousness: awake and alert Pain management: pain level controlled Vital Signs Assessment: post-procedure vital signs reviewed and stable Respiratory status: spontaneous breathing, nonlabored ventilation and respiratory function stable Cardiovascular status: blood pressure returned to baseline Postop Assessment: no apparent nausea or vomiting Anesthetic complications: no   No notable events documented.  Last Vitals:  Vitals:   02/13/24 1645 02/13/24 1713  BP: (!) 143/88 (!) 155/84  Pulse: 92 92  Resp: 15 18  Temp: 36.8 C 36.7 C  SpO2: 96% 95%                 Rayfield Cairo

## 2024-02-13 NOTE — Anesthesia Preprocedure Evaluation (Signed)
 Anesthesia Evaluation  Patient identified by MRN, date of birth, ID band Patient awake    Reviewed: Allergy & Precautions, NPO status , Patient's Chart, lab work & pertinent test results  Airway Mallampati: II  TM Distance: >3 FB Neck ROM: Full    Dental   Pulmonary former smoker   breath sounds clear to auscultation       Cardiovascular hypertension, Pt. on medications  Rhythm:Regular Rate:Normal     Neuro/Psych  Neuromuscular disease    GI/Hepatic negative GI ROS,,,(+) Cirrhosis     substance abuse  alcohol use  Endo/Other  diabetes, Type 2, Insulin  Dependent, Oral Hypoglycemic Agents    Renal/GU negative Renal ROS     Musculoskeletal   Abdominal   Peds  Hematology  (+) Blood dyscrasia, anemia   Anesthesia Other Findings   Reproductive/Obstetrics                             Anesthesia Physical Anesthesia Plan  ASA: 3  Anesthesia Plan: General   Post-op Pain Management: Ofirmev  IV (intra-op)*   Induction: Intravenous  PONV Risk Score and Plan: 2 and Dexamethasone , Midazolam , Treatment may vary due to age or medical condition and Ondansetron   Airway Management Planned: Oral ETT and Video Laryngoscope Planned  Additional Equipment:   Intra-op Plan:   Post-operative Plan: Extubation in OR  Informed Consent: I have reviewed the patients History and Physical, chart, labs and discussed the procedure including the risks, benefits and alternatives for the proposed anesthesia with the patient or authorized representative who has indicated his/her understanding and acceptance.     Dental advisory given  Plan Discussed with: CRNA  Anesthesia Plan Comments:        Anesthesia Quick Evaluation

## 2024-02-13 NOTE — Anesthesia Procedure Notes (Addendum)
 Procedure Name: Intubation Date/Time: 02/13/2024 11:44 AM  Performed by: Hebert Littler, CRNAPre-anesthesia Checklist: Patient identified, Emergency Drugs available, Suction available and Patient being monitored Patient Re-evaluated:Patient Re-evaluated prior to induction Oxygen Delivery Method: Circle System Utilized Preoxygenation: Pre-oxygenation with 100% oxygen Induction Type: IV induction Ventilation: Mask ventilation without difficulty Laryngoscope Size: Glidescope, Mac and 4 Grade View: Grade I Tube type: Oral Tube size: 7.5 mm Number of attempts: 1 Airway Equipment and Method: Stylet, Oral airway and Bite block Placement Confirmation: ETT inserted through vocal cords under direct vision, positive ETCO2 and breath sounds checked- equal and bilateral Secured at: 23 cm Tube secured with: Tape Dental Injury: Teeth and Oropharynx as per pre-operative assessment

## 2024-02-13 NOTE — Transfer of Care (Signed)
 Immediate Anesthesia Transfer of Care Note  Patient: Gerald Olson  Procedure(s) Performed: ANTERIOR CERVICAL DECOMPRESSION/DISCECTOMY FUSION, CERVICAL FOUR - CERVICAL FIVE, CERVICAL FIVE - CERVICAL SIX, CERVICAL SIX - CERVICAL SEVEN, CERVICAL SEVEN - THORACIC ONE (Spine Cervical)  Patient Location: PACU  Anesthesia Type:General  Level of Consciousness: awake and drowsy  Airway & Oxygen Therapy: Patient Spontanous Breathing and Patient connected to face mask oxygen  Post-op Assessment: Report given to RN and Patient moving all extremities  Post vital signs: Reviewed and stable  Last Vitals:  Vitals Value Taken Time  BP 160/91 02/13/24 16:00  Temp 36.7 C 02/13/24 16:00  Pulse 104 02/13/24 16:09  Resp 14 02/13/24 16:09  SpO2 90 % 02/13/24 16:09  Vitals shown include unfiled device data.  Last Pain:  Vitals:   02/13/24 1600  TempSrc:   PainSc: Asleep      Patients Stated Pain Goal: 5 (02/13/24 0837)  Complications: No notable events documented.

## 2024-02-14 ENCOUNTER — Other Ambulatory Visit: Payer: Self-pay

## 2024-02-14 ENCOUNTER — Other Ambulatory Visit (HOSPITAL_COMMUNITY): Payer: Self-pay

## 2024-02-14 DIAGNOSIS — M5412 Radiculopathy, cervical region: Secondary | ICD-10-CM | POA: Diagnosis not present

## 2024-02-14 LAB — GLUCOSE, CAPILLARY: Glucose-Capillary: 148 mg/dL — ABNORMAL HIGH (ref 70–99)

## 2024-02-14 MED ORDER — METHOCARBAMOL 500 MG PO TABS
500.0000 mg | ORAL_TABLET | Freq: Four times a day (QID) | ORAL | 1 refills | Status: AC | PRN
Start: 2024-02-14 — End: ?
  Filled 2024-02-14 – 2024-02-17 (×2): qty 30, 8d supply, fill #0
  Filled 2024-04-02: qty 30, 8d supply, fill #1

## 2024-02-14 MED ORDER — DEXAMETHASONE 1 MG PO TABS
ORAL_TABLET | ORAL | 0 refills | Status: AC
  Filled 2024-02-14: qty 15, 6d supply, fill #0

## 2024-02-14 MED FILL — Thrombin For Soln 5000 Unit: CUTANEOUS | Qty: 5000 | Status: AC

## 2024-02-14 NOTE — Progress Notes (Signed)
 Patient alert and oriented, mae's well, voiding adequate amount of urine, swallowing without difficulty, no c/o pain at time of discharge. Patient discharged home with family. Script and discharged instructions given to patient. Patient and family stated understanding of instructions given. Room was checked and accounted for all patient's belongings; discharge instructions concerning his medications, incision care, follow up appointment and when to call the doctor as needed were all discussed with patient by RN and he expressed understanding on the instructions given

## 2024-02-14 NOTE — Discharge Summary (Signed)
 Physician Discharge Summary  Patient ID: Gerald Olson MRN: [id number] DOB/AGE: 01-Mar-1966 58 y.o.  Admit date: 02/13/2024 Discharge date: 02/14/2024  Admission Diagnoses: Cervical myelopathy and radiculopathy C4-5 C5-6 C6-7 C7-T1  Discharge Diagnoses: Cervical myelopathy and radiculopathy C4-5 C5-6 C6-7 C7-T1 Principal Problem:   Cervical myelopathy (HCC)   Discharged Condition: good  Hospital Course: Patient tolerated surgery well  Consults: None  Significant Diagnostic Studies: None  Treatments: surgery: See op note  Discharge Exam: Blood pressure (!) 144/87, pulse 100, temperature 99 F (37.2 C), temperature source Oral, resp. rate 20, height 6' 2.5 (1.892 m), weight 98.9 kg, SpO2 96%. Incisions clean and dry Station and gait are intact  Disposition: Discharge disposition: 01-Home or Self Care       Discharge Instructions     Call MD for:  redness, tenderness, or signs of infection (pain, swelling, redness, odor or green/yellow discharge around incision site)   Complete by: As directed    Call MD for:  severe uncontrolled pain   Complete by: As directed    Call MD for:  temperature >100.4   Complete by: As directed    Diet - low sodium heart healthy   Complete by: As directed    Discharge instructions   Complete by: As directed    Okay to shower. Do not apply salves or appointments to incision. No heavy lifting with the upper extremities greater than 15 pounds. May resume driving when not requiring pain medication and patient feels comfortable with doing so.   Incentive spirometry RT   Complete by: As directed    Increase activity slowly   Complete by: As directed    No wound care   Complete by: As directed       Allergies as of 02/14/2024   No Known Allergies      Medication List     TAKE these medications    Accu-Chek Guide test strip Generic drug: glucose blood USE AS DIRECTED 2 TIMES DAILY   ALIVE MENS GUMMY MULTIVITAMINS PO Take 4 each  by mouth in the morning.   ALPHA LIPOIC ACID PO Take 4 capsules by mouth daily with lunch.   aspirin 325 MG tablet Take 1,300 mg by mouth every 8 (eight) hours as needed.   BLACK CURRANT SEED OIL PO Take 1 capsule by mouth daily with lunch.   cyclobenzaprine  5 MG tablet Commonly known as: FLEXERIL  Take 1 tablet (5 mg total) by mouth 2 (two) times daily as needed for muscle spasms.   dexamethasone  1 MG tablet Commonly known as: DECADRON  2 tablets twice daily for 2 days, one tablet twice daily for 2 days, one tablet daily for 2 days.   diclofenac  50 MG EC tablet Commonly known as: VOLTAREN  Take 50 mg by mouth daily as needed (pain).   Farxiga  5 MG Tabs tablet Generic drug: dapagliflozin  propanediol Take 1 tablet (5 mg total) by mouth daily before breakfast.   ibuprofen  200 MG tablet Commonly known as: ADVIL  Take 800-1,200 mg by mouth 2 (two) times daily as needed (pain.).   Lantus  SoloStar 100 UNIT/ML Solostar Pen Generic drug: insulin  glargine Inject 7 Units into the skin every morning.   MAGNESIUM COMPLEX HIGH POTENCY PO Take 1 capsule by mouth in the morning.   methocarbamol  500 MG tablet Commonly known as: ROBAXIN  Take 1 tablet (500 mg total) by mouth every 6 (six) hours as needed for muscle spasms. What changed: Another medication with the same name was added. Make sure you understand how  and when to take each.   methocarbamol  500 MG tablet Commonly known as: ROBAXIN  Take 1 tablet (500 mg total) by mouth every 6 (six) hours as needed for muscle spasms. What changed: You were already taking a medication with the same name, and this prescription was added. Make sure you understand how and when to take each.   naproxen sodium 220 MG tablet Commonly known as: ALEVE Take 660 mg by mouth 2 (two) times daily as needed (pain.).   OVER THE COUNTER MEDICATION Take 5 mLs by mouth every 3 (three) hours as needed (pain). Wild Lettuce Tincture   pregabalin  100 MG  capsule Commonly known as: Lyrica  Take 1 capsule (100 mg total) by mouth 2 (two) times daily. What changed:  when to take this reasons to take this   TechLite Plus Pen Needles 32G X 4 MM Misc Generic drug: Insulin  Pen Needle Use to inject Victoza  once a day   TURMERIC-GINGER PO Take 3 capsules by mouth in the morning. With Garlic   VITAMIN D -3 PO Take 5,000 Units by mouth daily with lunch.   zinc sulfate (50mg  elemental zinc) 220 (50 Zn) MG capsule Take 220 mg by mouth daily after lunch.         Signed: Kevin Pellant Shyler Holzman 02/14/2024, 8:25 AM

## 2024-02-14 NOTE — Evaluation (Signed)
 Occupational Therapy Evaluation Patient Details Name: Gerald Olson MRN: [id number] DOB: 12/01/1965 Today's Date: 02/14/2024   History of Present Illness   Pt is a 58 y.o. male presenting to [facility] on 02/13/24 for elective ACDF C4-T1. PMH significant for alcohol use disorder, leukemia, diffuse idiopathic skeletal hyperostosis, glaucoma, ADHD, diabetic polyneuropathy, DMII.     Clinical Impressions Pt admitted for above, PTA pt was ind with ADLs/iADLs and reported limited use of RUE. Pt not s/p cervical sx with good recall of Cspine precautions and demonstrated ability to complete ADLs with setup to Mod I level using compensatory strategies. He still has limited digit ext along ulnar/median distribution of R hand but reports return of wrist ext which is comparable to his L. His RUE remains weak but ROM WFL other than digits, provided pt with nerve glide HEP. Pt has no further acute skilled OT needs, would benefit from outpt OT post acute to work on listed deficits.      If plan is discharge home, recommend the following:   Other (comment) (prn)     Functional Status Assessment   Patient has had a recent decline in their functional status and/or demonstrates limited ability to make significant improvements in function in a reasonable and predictable amount of time (improving from baseline.)     Equipment Recommendations   None recommended by OT (has rec DME)     Recommendations for Other Services         Precautions/Restrictions   Precautions Precautions: Cervical Precaution Booklet Issued: Yes (comment) Recall of Precautions/Restrictions: Intact Precaution/Restrictions Comments: Reviewed C spine precautions Required Braces or Orthoses: Cervical Brace Cervical Brace: Soft collar;At all times Restrictions Weight Bearing Restrictions Per Provider Order: No     Mobility Bed Mobility Overal bed mobility: Modified Independent             General bed mobility  comments: Pt OOB on arrival, demonstrate log roll without challenge    Transfers Overall transfer level: Independent Equipment used: None                      Balance Overall balance assessment: Mild deficits observed, not formally tested                                         ADL either performed or assessed with clinical judgement   ADL Overall ADL's : Needs assistance/impaired Eating/Feeding: Independent;Sitting   Grooming: Standing;Supervision/safety   Upper Body Bathing: Standing;Supervision/ safety   Lower Body Bathing: Set up;Sit to/from stand   Upper Body Dressing : Sitting;Modified independent   Lower Body Dressing: Modified independent;Sitting/lateral leans;Sit to/from stand Lower Body Dressing Details (indicate cue type and reason): pt demonstrated ability to don socks with lasso/figure four technique. Toilet Transfer: Supervision/safety;Ambulation Toilet Transfer Details (indicate cue type and reason): supervision for safety. Toileting- Clothing Manipulation and Hygiene: Supervision/safety;Sitting/lateral lean       Functional mobility during ADLs: Supervision/safety       Vision         Perception         Praxis         Pertinent Vitals/Pain Pain Assessment Pain Assessment: 0-10 Pain Score: 7  Pain Location: neck, op site Pain Descriptors / Indicators: Aching Pain Intervention(s): Monitored during session, Limited activity within patient's tolerance     Extremity/Trunk Assessment Upper Extremity Assessment Upper Extremity Assessment: RUE deficits/detail;Right hand dominant;Generalized  weakness (LUE strength and coordination WFL, ROM WFL) RUE Deficits / Details: ROM WFL, no active digit ext of pinky/ring finger/middle. Very weak grap. Wrist flex/ext comparable to L. Has sensation but less compared to L RUE Sensation: decreased light touch RUE Coordination: decreased fine motor;decreased gross motor   Lower  Extremity Assessment Lower Extremity Assessment: Defer to PT evaluation   Cervical / Trunk Assessment Cervical / Trunk Assessment: Neck Surgery   Communication Communication Communication: No apparent difficulties   Cognition Arousal: Alert Behavior During Therapy: WFL for tasks assessed/performed Cognition: No apparent impairments                               Following commands: Intact       Cueing  General Comments   Cueing Techniques: Verbal cues  Cervical brace donned   Exercises Other Exercises Other Exercises: ulnar/median nerve glides (without cervical component). Other Exercises: Tenodesis stretch Other Exercises: wall ladder LUE to stretch scapula/shoulder Other Exercises: Isometric int rotation LUE   Shoulder Instructions      Home Living Family/patient expects to be discharged to:: Private residence Living Arrangements: Alone Available Help at Discharge: Family;Neighbor Type of Home: House Home Access: Stairs to enter [employer] of Steps: 3 Entrance Stairs-Rails: None Home Layout: One level     Bathroom Shower/Tub: Chief Strategy Officer: Standard     Home Equipment: Cane - single point;Adaptive equipment Adaptive Equipment: Reacher Additional Comments: Has homemade grab bars, above toilet made out of paracord and PVC; discussed limiting use of pulling overhead post op. Uses tub for bathing. has a pickle bucket that he uses for the shower if not soaking      Prior Functioning/Environment Prior Level of Function : Independent/Modified Independent             Mobility Comments: no AD with pt reportng that spaces are too narrow in house to use them ADLs Comments: independent in ADL and IADL    OT Problem List: Pain   OT Treatment/Interventions:        OT Goals(Current goals can be found in the care plan section)   Acute Rehab OT Goals Patient Stated Goal: to go home OT Goal Formulation: All  assessment and education complete, DC therapy Time For Goal Achievement: 02/28/24 Potential to Achieve Goals: Good   OT Frequency:       Co-evaluation              AM-PAC OT 6 Clicks Daily Activity     Outcome Measure Help from another person eating meals?: None Help from another person taking care of personal grooming?: A Little Help from another person toileting, which includes using toliet, bedpan, or urinal?: A Little Help from another person bathing (including washing, rinsing, drying)?: A Little Help from another person to put on and taking off regular upper body clothing?: None Help from another person to put on and taking off regular lower body clothing?: None 6 Click Score: 21   End of Session Equipment Utilized During Treatment: Cervical collar Nurse Communication: Mobility status  Activity Tolerance: Patient tolerated treatment well Patient left: in bed;with call bell/phone within reach  OT Visit Diagnosis: Pain Pain - part of body:  (neck)                Time: 4098-1191 OT Time Calculation (min): 25 min Charges:  OT General Charges $OT Visit: 1 Visit OT Evaluation $OT Eval Low Complexity:  1 Low OT Treatments $Therapeutic Exercise: 8-22 mins  02/14/2024  AB, OTR/L  Acute Rehabilitation Services  Office: [phone]   Jorene New 02/14/2024, 9:23 AM

## 2024-02-14 NOTE — Discharge Instructions (Signed)
 Wound Care Leave incision open to air. You may shower. Do not scrub directly on incision.  Do not put any creams, lotions, or ointments on incision. Activity Walk each and every day, increasing distance each day. No lifting greater than 8 lbs.  Avoid excessive neck motion. No driving for 2 weeks; may ride as a passenger locally. Wear neck brace at all times except when showering.  If provided soft collar, may wear for comfort unless otherwise instructed. Diet Resume your normal diet.  Return to Work Will be discussed at you follow up appointment. Call Your Doctor If Any of These Occur Redness, drainage, or swelling at the wound.  Temperature greater than 101 degrees. Severe pain not relieved by pain medication. Increased difficulty swallowing. Incision starts to come apart. Follow Up Appt Call today for appointment in 2 weeks ([phone]) or for problems.  If you have any hardware placed in your spine, you will need an x-ray before your appointment.

## 2024-02-14 NOTE — Evaluation (Signed)
 Physical Therapy Brief Evaluation and Discharge Note Patient Details Name: Gerald Olson MRN: [id number] DOB: September 03, 1965 Today's Date: 02/14/2024   History of Present Illness  Pt is a 58 y.o. male presenting to [facility] on 02/13/24 for elective ACDF C4-T1. PMH significant for alcohol use disorder, leukemia, diffuse idiopathic skeletal hyperostosis, glaucoma, ADHD, diabetic polyneuropathy, DMII.  Clinical Impression  Patient evaluated by Physical Therapy with no further acute PT needs identified. Pt reports return of right wrist extension/flexion post op, but continued weakness and numbness along median/ulnar distribution in fingers 3-5. Pt overall is mobilizing well. Ambulating 500 ft with no assistive device and negotiated 3 steps with no rails independently. Reviewed cervical precautions and provided written handout. OT to follow up with HEP and pt would benefit from OPOT services for UE function and recovery. All education has been completed and the patient has no further questions. No follow-up Physical Therapy or equipment needs. PT is signing off. Thank you for this referral.      PT Assessment Patient does not need any further PT services  Assistance Needed at Discharge  Intermittent Supervision/Assistance    Equipment Recommendations None recommended by PT  Recommendations for Other Services  OT consult    Precautions/Restrictions Precautions Precautions: Cervical Precaution Booklet Issued: Yes (comment) Required Braces or Orthoses: Cervical Brace Cervical Brace: Soft collar;At all times Restrictions Weight Bearing Restrictions Per Provider Order: No        Mobility  Bed Mobility   Supine/Sidelying to sit: Modified independent (Device/Increased time)   General bed mobility comments: Cues for log roll technique, exiting towards left side of bed  Transfers Overall transfer level: Independent Equipment used: None                    Ambulation/Gait Ambulation/Gait  assistance: Independent Gait Distance (Feet): 500 Feet Assistive device: None Gait Pattern/deviations: WFL(Within Functional Limits)      Home Activity Instructions    Stairs Stairs: Yes Stairs assistance: Independent Stair Management: No rails Number of Stairs: 3    Modified Rankin (Stroke Patients Only)        Balance Overall balance assessment: No apparent balance deficits (not formally assessed)                        Pertinent Vitals/Pain PT - Brief Vital Signs All Vital Signs Stable: Yes Pain Assessment Pain Assessment: 0-10 Pain Score: 7      Home Living Family/patient expects to be discharged to:: Private residence Living Arrangements: Alone Available Help at Discharge: Neighbor;Available PRN/intermittently Home Environment: Stairs to enter  Stairs-Number of Steps: 3 Home Equipment: Cane - single point;Adaptive equipment Adaptive Equipment: Reacher Additional Comments: Has homemade grab bars, above toilet made out of paracord and PVC; discussed limiting use of pulling overhead post op. Uses tub for bathing    Prior Function Level of Independence: Independent      UE/LE Assessment   UE ROM/Strength/Tone/Coordination: Impaired UE ROM/Strength/Tone/Coordination Deficits: Pt reports return of R wrist flexors/extensors, able to perform against gravity. Unable to extend fingers 3-5, reports numbness  LE ROM/Strength/Tone/Coordination: [facility]      Communication   Communication Communication: No apparent difficulties     Cognition Overall Cognitive Status: Appears within functional limits for tasks assessed/performed       General Comments      Exercises     Assessment/Plan    PT Problem List         PT Visit Diagnosis Pain  No Skilled PT All education completed;Patient is independent with all acitivity/mobility   Co-evaluation                AMPAC 6 Clicks Help needed turning from your back to your side while in a  flat bed without using bedrails?: None Help needed moving from lying on your back to sitting on the side of a flat bed without using bedrails?: None Help needed moving to and from a bed to a chair (including a wheelchair)?: None Help needed standing up from a chair using your arms (e.g., wheelchair or bedside chair)?: None Help needed to walk in hospital room?: None Help needed climbing 3-5 steps with a railing? : None 6 Click Score: 24      End of Session Equipment Utilized During Treatment: Cervical collar Activity Tolerance: Patient tolerated treatment well Patient left: in bed;with call bell/phone within reach Nurse Communication: Mobility status PT Visit Diagnosis: Pain Pain - part of body:  (neck)     Time: 4782-9562 PT Time Calculation (min) (ACUTE ONLY): 26 min  Charges:   PT Evaluation $PT Eval Low Complexity: 1 Low      Verdia Glad, PT, DPT Acute Rehabilitation Services Office [phone]   Claria Crofts  02/14/2024, 8:59 AM

## 2024-02-17 ENCOUNTER — Other Ambulatory Visit (HOSPITAL_COMMUNITY): Payer: Self-pay

## 2024-02-17 ENCOUNTER — Encounter (HOSPITAL_COMMUNITY): Payer: Self-pay | Admitting: Neurological Surgery

## 2024-02-17 ENCOUNTER — Other Ambulatory Visit: Payer: Self-pay

## 2024-02-19 ENCOUNTER — Other Ambulatory Visit

## 2024-02-27 ENCOUNTER — Other Ambulatory Visit: Payer: Self-pay

## 2024-03-03 ENCOUNTER — Encounter: Payer: Self-pay | Admitting: *Deleted

## 2024-03-03 ENCOUNTER — Other Ambulatory Visit: Payer: Self-pay | Admitting: *Deleted

## 2024-03-04 ENCOUNTER — Other Ambulatory Visit: Payer: Self-pay

## 2024-03-06 DIAGNOSIS — Z6826 Body mass index (BMI) 26.0-26.9, adult: Secondary | ICD-10-CM | POA: Diagnosis not present

## 2024-03-06 DIAGNOSIS — M5412 Radiculopathy, cervical region: Secondary | ICD-10-CM | POA: Diagnosis not present

## 2024-03-10 DIAGNOSIS — R490 Dysphonia: Secondary | ICD-10-CM | POA: Diagnosis not present

## 2024-03-10 DIAGNOSIS — R1319 Other dysphagia: Secondary | ICD-10-CM | POA: Diagnosis not present

## 2024-03-10 DIAGNOSIS — J3801 Paralysis of vocal cords and larynx, unilateral: Secondary | ICD-10-CM | POA: Diagnosis not present

## 2024-04-02 ENCOUNTER — Other Ambulatory Visit: Payer: Self-pay

## 2024-04-02 ENCOUNTER — Other Ambulatory Visit (HOSPITAL_COMMUNITY): Payer: Self-pay

## 2024-04-02 NOTE — Patient Outreach (Signed)
 Complex Care Management   Visit Note  04/02/2024  Name:  Gerald Olson MRN: [id number] DOB: 22-Feb-1966  Situation: Referral received for Complex Care Management related to Chronic Pain I obtained verbal consent from Patient.  Visit completed with patient  on the phone  Background:   Past Medical History:  Diagnosis Date   ADHD (attention deficit hyperactivity disorder)    Diagnosed during teenage years   Alcohol use disorder    On average 14-21 drinks per week   Alcoholic cirrhosis of liver    Per patient - condition is not related to moderate alcohol use but rather prolonged NSAID use and recent abuse.   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[place]   Major depressive disorder    Other spondylosis with radiculopathy, cervical region    Retinopathy    Right eye; per patient report   Type 2 diabetes mellitus with hyperglycemia     Assessment: Patient Reported Symptoms:  Cognitive Cognitive Status: Alert and oriented to person, place, and time, Insightful and able to interpret abstract concepts, Normal speech and language skills Cognitive/Intellectual Conditions Management [RPT]: None reported or documented in medical history or problem list   Health Maintenance Behaviors: Annual physical exam Healing Pattern: Average  Neurological Neurological Review of Symptoms: Other:, Numbness Oher Neurological Symptoms/Conditions [RPT]: 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 creating issues contacting providers, left arm numb upon waking, 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 - states did not notify neurosurgery and appointment in 2 Chundra Sauerwein, - called Neurosurgery with patient on line and left message for them to call back re post surgical symptoms - patient received call back during this visit Neurological Management Strategies: Medication therapy, Routine screening Neurological Self-Management Outcome: 3 (uncertain)  HEENT   HEENT Comment: vocal cord paralysis, some difficulty eating - has adjusted diet accordingly    Cardiovascular Cardiovascular Symptoms Reported: Not assessed    Respiratory Respiratory Symptoms Reported: Not assesed    Endocrine Endocrine Symptoms Reported: No symptoms reported Is patient diabetic?: Yes Is patient checking blood sugars at home?: No List most recent blood sugar readings, include date and time of day: not checking regularly - states I have bigger issues discussed importance of managing diabetes due to affect on other organs - patient checked BG during visit - 133 ranges 120-135    Gastrointestinal Gastrointestinal Symptoms Reported: Not assessed      Genitourinary Genitourinary Symptoms Reported: Not assessed    Integumentary Integumentary Symptoms Reported: Not assessed    Musculoskeletal Musculoskelatal Symptoms Reviewed: Back pain, Difficulty walking, Joint pain, Limited mobility, Muscle pain, Unsteady gait, Weakness Additional Musculoskeletal Details: cane, spoke with Neurosurgeon Office during this visit regarding issues with pain, right arm and hand, wrist,  left arm Musculoskeletal Management Strategies: Routine screening, Medication therapy Falls in the past year?: Yes Number of falls in past year: 1 or less Was there an injury with Fall?: No Fall Risk Category Calculator: 1 Patient Fall Risk Level: Low Fall Risk Patient at Risk for Falls Due  to: History of fall(s), Impaired balance/gait, Impaired mobility, Orthopedic patient Fall risk Follow up: Falls evaluation completed, Falls prevention discussed (ED for  fall post surgery head bumps)  Psychosocial Psychosocial Symptoms Reported: Depression - if selected complete PHQ 2-9, Anxiety - if selected complete GAD Additional Psychological Details: no answer to LCSW call - staets after Grad Scool set up his own CBT - [employer] [facility] - will try to get back to that, Behavioral Management Strategies: Activity, Coping strategies Behavioral Health Comment: ETOH, Vapes CBD Major Change/Loss/Stressor/Fears (CP): Medical condition, self, Resources, Environment Techniques to [employer] with Loss/Stress/Change: Diversional activities Quality of Family Relationships: supportive Do you feel physically threatened by others?: No      04/02/2024    4:17 PM  Depression screen PHQ 2/9  Decreased Interest 3  Down, Depressed, Hopeless 3  PHQ - 2 Score 6  Altered sleeping 3  Tired, decreased energy 3  Change in appetite 3  Feeling bad or failure about yourself  3  Trouble concentrating 3  Moving slowly or fidgety/restless 3  Suicidal thoughts 0  PHQ-9 Score 24  Difficult doing work/chores Very difficult    There were no vitals filed for this visit.  Medications Reviewed Today   Medications were not reviewed in this encounter     Recommendation:   PCP Follow-up Specialty provider follow-up Neurosurgery  Follow Up Plan:   Telephone follow-up 2 K[place] No  [name], MSN, RN [facility]  [facility], [facility] RN Care Manager Direct Dial : [phone] Fax: [phone]

## 2024-04-02 NOTE — Patient Instructions (Signed)
 Visit Information  Gerald Olson was given information about Medicaid Managed Care team care coordination services as a part of their [employer] Medicaid benefit.   If you would like to schedule transportation through your [employer] [facility] plan, please call the following number at least 2 days in advance of your appointment: [phone]  If you are experiencing a behavioral health crisis, call the [employer] [facility]  Behavioral Health Crisis Line at [phone]). The line is available 24 hours a day, seven days a week.   Gerald Olson - following are the goals we discussed in your visit today:    Goals Addressed             This Visit's Progress    [facility] RN Care Plan- Chronic right arm/wrist pain /inability to use./ back pain   No change    Problems:  Chronic Disease Management support and education needs related to right arm/ back pain Financial Constraints with limited social support and reluctance to engage with housing resources and therapy.  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  Continued ETOH use - approx 4 beers daily and Vapes CBD oil - discussed affect of ETOH on liver, interaction with medications, vaping and lungs/throat  Goal: Over the next 3 months the Patient will attend all scheduled medical appointments: with provider as evidenced by patient report /chart review.         continue to work with Medical illustrator and/or Social Worker to address care management and care coordination needs related to right arm/ wrist pain as evidenced by adherence to care management team scheduled appointments     demonstrate Ongoing adherence to prescribed treatment plan for right arm/ wrist pain as evidenced by patient report/ chart review. take all medications exactly as prescribed and will call provider for medication related questions as evidenced by patient report / chart  review.      Interventions:   Evaluation of current treatment plan related to right wrist/ arm pain, also left arm numbness at times,  self-management and patient's adherence to plan as established by provider. Discussed plans with patient for ongoing care management follow up and provided patient with direct contact information for care management team Advised to keep follow up visit with neurologist - called Neurosurgery with patient on line - received call back to notify of change in symptoms. Called PCP with patient on line and made appointment for tomorrow regarding referral to ortho regarding Right wrist Advised to notify provider for worsening symptoms.  Advised to take medications as prescribed . Called pharmacy with patient on line and assisted to renew medications   Referred to social worker due to unsafe housing conditions: mold, leaking roof, collapsed basement. Patient declines to utilize the resources provided at this time, receiving assistance with repairs from neighbor/friend Discussed reducing ETOH due to impact on liver and interactions with meds,  reduce vaping due to impact on lungs and throat Encouraged to get assist from friends/mother regarding place to live while house being repaired.  Aware LCSW appointment can be rescheduled when ready to talk.   Patient Self-Care Activities:  Attend all scheduled provider appointments Call pharmacy for medication refills 3-7 days in advance of running out of medications Call provider office for new concerns or questions  Take medications as prescribed   Keep follow up visit with neurologist and report worsening symptoms.    Plan:  Telephone follow up appointment with care  management team member scheduled for:  04/15/24 at 2:00 pm             Please see education materials related to DM and Alcohol,  fall prevention, vocal cord paralysis pain, provided by MyChart link.  Patient verbalizes understanding of instructions and  care plan provided today and agrees to view in MyChart. Active MyChart status and patient understanding of how to access instructions and care plan via MyChart confirmed with patient.     Telephone follow up appointment with Managed Medicaid care management team member scheduled for: 04/15/2024 at 2:00 pm  [name], MSN, RN [place]  [facility], [facility] RN Care Manager Direct Dial : [phone] Fax: [phone]  Vocal Cord Paralysis  Vocal cord paralysis is the inability of one or both of the vocal cords to move properly because the muscles are paralyzed. The vocal cords are two bands of elastic muscle located inside the voice box (larynx). When you breathe in (inhale), your vocal cords open wide to let air pass into your lungs. When you eat or swallow, your vocal cords close tightly to keep food or liquids from passing into your lungs. When you speak, your vocal cords come close together and vibrate to create sound. Vocal cord paralysis can cause problems with any of these functions. In most cases, vocal cord paralysis affects only one vocal cord (unilateral). In rare cases, both vocal cords are affected (bilateral). What are the causes? This condition may be caused by: Damage to a nerve that controls vocal cord movement. Neck or chest injury. Cancer of the larynx, neck, brain, or chest. Stroke. Nervous system diseases. Infections from viruses. Sometimes the cause is not known. What are the signs or symptoms? Symptoms of this condition depend on whether one or both vocal cords are affected and where they are affected. Symptoms may include: Voice changes, such as: The inability to speak loudly. Having a limited variety of high and low sounds (pitch). The voice lasting for only a short time due to running out of air when speaking. Hoarseness, or a gravelly voice. Weak, breathy voice. Trouble breathing. Noisy breathing. Choking and coughing while  eating or drinking (aspiration). This can lead to a lung infection (pneumonia). How is this diagnosed? This condition may be diagnosed based on: Your symptoms and medical history. A physical exam. Procedures or tests, such as: An exam of your vocal cords with a lighted, flexible scope (endoscopic laryngoscopy). An exam that measures the electrical currents in the nerves and muscles of your larynx (laryngeal electromyogram). Imaging tests, such as an MRI or a CT scan. How is this treated? Treatment for this condition depends on the type of paralysis you have. Treatment may include: Speech therapy. Injecting a paralyzed vocal cord with a substance to increase its size. Surgery to move one cord closer to the other. Muscle and nerve implants. Follow these instructions at home: Eat and drink slowly. Avoid very hot or very cold food or drinks. Avoid speaking too much, too long, or too loudly. Do not whisper. Do not use any products that contain nicotine or tobacco. These products include cigarettes, chewing tobacco, and vaping devices, such as e-cigarettes. If you need help quitting, ask your health care provider. Keep all follow-up visits, including visits to speech therapists. This is important. Contact a health care provider if: Your voice is weaker or more hoarse. Your breathing is getting noisy. You cough or choke while eating or swallowing. You have a cough and a fever. Get  help right away if: You have trouble breathing. You have chest pain. These symptoms may be an emergency. Get help right away. Call 911. Do not wait to see if the symptoms will go away. Do not drive yourself to the hospital. Summary Vocal cord paralysis is the inability of one or both of the vocal cords to move properly because the muscles are paralyzed. Common symptoms include changes in your voice, trouble breathing, hoarseness, noisy breathing, and choking and coughing while eating or drinking. Paralysis may  be diagnosed based on your symptoms and medical history, a physical exam, and procedures or tests. This may include an exam of the vocal cords (laryngoscopy) and an imaging test such as a CT scan or an MRI. Treatment may include speech therapy, injections, surgery, and muscle or nerve implants. This information is not intended to replace advice given to you by your health care provider. Make sure you discuss any questions you have with your health care provider. Document Revised: 08/07/2021 Document Reviewed: 08/07/2021 Elsevier Patient Education  2024 Elsevier Inc.    Diabetes Mellitus and Alcohol Intake Learn how alcohol affects blood sugar levels and precautions you should take when drinking alcohol. To view the content, go to this web address: https://pe.elsevier.[email or web address]  This video will expire on: 08/07/2025. If you need access to this video following this date, please reach out to the healthcare provider who assigned it to you. This information is not intended to replace advice given to you by your health care provider. Make sure you discuss any questions you have with your health care provider. Elsevier Patient Education  2024 [employer].  Fall Prevention in the Home, Adult Falls can cause injuries and affect people of all ages. There are many simple things that you can do to make your home safe and to help prevent falls. If you need it, ask for help making these changes. What actions can I take to prevent falls? General information Use good lighting in all rooms. Make sure to: Replace any light bulbs that burn out. Turn on lights if it is dark and use night-lights. Keep items that you use often in easy-to-reach places. Lower the shelves around your home if needed. Move furniture so that there are clear paths around it. Do not keep throw rugs or other things on the floor that can make you trip. If any of your floors are uneven, fix them. Add color or contrast paint or tape  to clearly mark and help you see: Grab bars or handrails. First and last steps of staircases. Where the edge of each step is. If you use a ladder or stepladder: Make sure that it is fully opened. Do not climb a closed ladder. Make sure the sides of the ladder are locked in place. Have someone hold the ladder while you use it. Know where your pets are as you move through your home. What can I do in the bathroom?     Keep the floor dry. Clean up any water that is on the floor right away. Remove soap buildup in the bathtub or shower. Buildup makes bathtubs and showers slippery. Use non-skid mats or decals on the floor of the bathtub or shower. Attach bath mats securely with double-sided, non-slip rug tape. If you need to sit down while you are in the shower, use a non-slip stool. Install grab bars by the toilet and in the bathtub and shower. Do not use towel bars as grab bars. What can I do in the bedroom?  Make sure that you have a light by your bed that is easy to reach. Do not use any sheets or blankets on your bed that hang to the floor. Have a firm bench or chair with side arms that you can use for support when you get dressed. What can I do in the kitchen? Clean up any spills right away. If you need to reach something above you, use a sturdy step stool that has a grab bar. Keep electrical cables out of the way. Do not use floor polish or wax that makes floors slippery. What can I do with my stairs? Do not leave anything on the stairs. Make sure that you have a light switch at the top and the bottom of the stairs. Have them installed if you do not have them. Make sure that there are handrails on both sides of the stairs. Fix handrails that are broken or loose. Make sure that handrails are as long as the staircases. Install non-slip stair treads on all stairs in your home if they do not have carpet. Avoid having throw rugs at the top or bottom of stairs, or secure the rugs with  carpet tape to prevent them from moving. Choose a carpet design that does not hide the edge of steps on the stairs. Make sure that carpet is firmly attached to the stairs. Fix any carpet that is loose or worn. What can I do on the outside of my home? Use bright outdoor lighting. Repair the edges of walkways and driveways and fix any cracks. Clear paths of anything that can make you trip, such as tools or rocks. Add color or contrast paint or tape to clearly mark and help you see high doorway thresholds. Trim any bushes or trees on the main path into your home. Check that handrails are securely fastened and in good repair. Both sides of all steps should have handrails. Install guardrails along the edges of any raised decks or porches. Have leaves, snow, and ice cleared regularly. Use sand, salt, or ice melt on walkways during winter months if you live where there is ice and snow. In the garage, clean up any spills right away, including grease or oil spills. What other actions can I take? Review your medicines with your health care provider. Some medicines can make you confused or feel dizzy. This can increase your chance of falling. Wear closed-toe shoes that fit well and support your feet. Wear shoes that have rubber soles and low heels. Use a cane, walker, scooter, or crutches that help you move around if needed. Talk with your provider about other ways that you can decrease your risk of falls. This may include seeing a physical therapist to learn to do exercises to improve movement and strength. Where to find more information Centers for Disease Control and Prevention, STEAD[employer]: TonerPromos.no [employer] on Aging: BaseRingTones.pl National Institute on Aging: BaseRingTones.pl Contact a health care provider if: You are afraid of falling at home. You feel weak, drowsy, or dizzy at home. You fall at home. Get help right away if you: Lose consciousness or have trouble moving after a fall. Have a fall  that causes a head injury. These symptoms may be an emergency. Get help right away. Call 911. Do not wait to see if the symptoms will go away. Do not drive yourself to the hospital. This information is not intended to replace advice given to you by your health care provider. Make sure you discuss any questions you have with  your health care provider. Document Revised: 04/16/2022 Document Reviewed: 04/16/2022 Elsevier Patient Education  2024 Elsevier Inc.    Chronic Pain, Adult Chronic pain is a type of pain that lasts or keeps coming back for at least 3-6 months. You may have headaches, pain in the abdomen, or pain in other areas of the body. Chronic pain may be related to an illness, injury, or a health condition. Sometimes, the cause of chronic pain is not known. Chronic pain can make it hard for you to do daily activities. If it is not treated, chronic pain can lead to anxiety and depression. Treatment depends on the cause of your pain and how severe it is. You may need to work with a pain specialist to come up with a treatment plan. Many people benefit from two or more types of treatment to control their pain. Follow these instructions at home: Treatment plan Follow your treatment plan as told by your health care provider. This may include: Gentle, regular exercise. Eating a healthy diet that includes foods such as vegetables, fruits, fish, and lean meats. Mental health therapy (cognitive or behavioral therapy) that changes the way you think or act in response to the pain. This may help improve how you feel. Doing physical therapy exercises to improve movement and strength. Meditation, yoga, acupuncture, or massage therapy. Using the oils from plants in your environment or on your skin (aromatherapy). Other treatments may include: Over-the-counter or prescription medicines. Color, light, or sound therapy. Local electrical stimulation. The electrical pulses help to relieve pain by  temporarily stopping the nerve impulses that cause you to feel pain. Injections. These deliver numbing or pain-relieving medicines into the spine or the area of pain.  Medicines Take over-the-counter and prescription medicines only as told by your health care provider. Ask your health care provider if the medicine prescribed to you: Requires you to avoid driving or using machinery. Can cause constipation. You may need to take these actions to prevent or treat constipation: Drink enough fluid to keep your urine pale yellow. Take over-the-counter or prescription medicines. Eat foods that are high in fiber, such as beans, whole grains, and fresh fruits and vegetables. Limit foods that are high in fat and processed sugars, such as fried or sweet foods. Lifestyle  Ask your health care provider whether you should keep a pain diary. Your health care provider will tell you what information to write in the diary. This may include: When you have pain. What the pain feels like. How medicines and other behaviors or treatments help to reduce the pain. Consider talking with a mental health care provider about how to help manage chronic pain. Consider joining a chronic pain support group. Try to control or lower your stress levels. Talk with your health care provider about ways to do this. General instructions Learn as much as you can about how to manage your chronic pain. Ask your health care provider if an intensive pain rehabilitation program or a chronic pain specialist would be helpful. Check your pain level as told by your health care provider. Ask your health care provider if you should use a pain scale. Contact a health care provider if: Your pain is not controlled with treatment. You have new pain. You have side effects from pain medicine. You feel weak or you have trouble doing your normal activities. You have trouble sleeping or you develop confusion. You lose feeling or have numbness in  your body. You lose control of your bowels or bladder. Get  help right away if: Your pain suddenly gets much worse. You develop chest pain. You have trouble breathing or shortness of breath. You faint, or another person sees you faint. These symptoms may be an emergency. Get help right away. Call 911. Do not wait to see if the symptoms will go away. Do not drive yourself to the hospital. Also, get help right away if: You have thoughts about hurting yourself or others. Take one of these steps if you feel like you may hurt yourself or others, or have thoughts about taking your own life: Go to your nearest emergency room. Call 911. Call the National Suicide Prevention Lifeline at 587-334-2852 or 988. This is open 24 hours a day. Text the Crisis Text Line at (519) 362-0722. This information is not intended to replace advice given to you by your health care provider. Make sure you discuss any questions you have with your health care provider. Document Revised: 04/04/2022 Document Reviewed: 03/07/2022 Elsevier Patient Education  2024 [employer].

## 2024-04-03 ENCOUNTER — Other Ambulatory Visit (HOSPITAL_COMMUNITY): Payer: Self-pay

## 2024-04-03 ENCOUNTER — Encounter: Payer: Self-pay | Admitting: General Practice

## 2024-04-03 ENCOUNTER — Other Ambulatory Visit: Payer: Self-pay

## 2024-04-03 ENCOUNTER — Ambulatory Visit: Admitting: General Practice

## 2024-04-03 VITALS — BP 136/82 | HR 96 | Temp 98.9°F | Ht 74.0 in | Wt 213.2 lb

## 2024-04-03 DIAGNOSIS — G8929 Other chronic pain: Secondary | ICD-10-CM | POA: Insufficient documentation

## 2024-04-03 DIAGNOSIS — R490 Dysphonia: Secondary | ICD-10-CM | POA: Insufficient documentation

## 2024-04-03 DIAGNOSIS — M25531 Pain in right wrist: Secondary | ICD-10-CM

## 2024-04-03 MED ORDER — DICLOFENAC SODIUM 1 % EX GEL
2.0000 g | Freq: Four times a day (QID) | CUTANEOUS | 1 refills | Status: AC
  Filled 2024-04-03: qty 100, 13d supply, fill #0
  Filled 2024-07-17: qty 100, 12d supply, fill #0
  Filled 2024-08-12: qty 200, 12d supply, fill #0

## 2024-04-03 MED ORDER — METHOCARBAMOL 500 MG PO TABS
500.0000 mg | ORAL_TABLET | Freq: Four times a day (QID) | ORAL | 0 refills | Status: DC | PRN
  Filled 2024-04-03: qty 30, 8d supply, fill #0

## 2024-04-03 NOTE — Patient Instructions (Addendum)
 You will either be contacted via phone regarding your referral to ortho , or you may receive a letter on your MyChart portal from our referral team with instructions for scheduling an appointment. Please let us  know if you have not been contacted by anyone within two weeks.  I sent in the prescription for Voltaren  gel to the pharmacy.   Follow up in 3 months for chronic care management.   It was a pleasure to see you today!

## 2024-04-03 NOTE — Progress Notes (Signed)
 Established Patient Office Visit  Subjective   Patient ID: Gerald Olson, male    DOB: 1966/08/20  Age: 58 y.o. MRN: [id number]  Chief Complaint  Patient presents with   Referral    Wants to discuss ortho referral for R wrist due to limited ROM.     HPI  Gerald Olson is a 58 year old male with past medical history of HTN, type 2 DM, alcoholic cirrhosis of liver, cervical spondylosis, cervical radicular pain presents today for an acute visit.   Right wrist pain: symptom onset January. Describes pain sharp and needle pain. Pain is worse when he puts pressure or bends his hand. Limited ROM. He is following with neurosurgery.  He has tried NSAIDs and muscle relaxer with no relief. He did find that voltaren  has been helping him. He is hoping to get the topical gel.  He is also in need of a refill of his muscle relaxer Robaxin .   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, with long-term current use of insulin  (HCC) 11/20/2023   Right arm weakness 11/20/2023   Establishing care with new doctor, encounter for 11/20/2023   Encounter for screening involving social determinants of health (SDoH) 11/20/2023   Other spondylosis with radiculopathy, lumbar region 10/07/2023   Onychomycosis 09/21/2022   Other fatigue 09/20/2021   Spondylolisthesis at L5-S1 level 01/24/2021   Cervical radicular pain 10/26/2020   Lumbar facet arthropathy 10/26/2020   Lumbar degenerative disc disease 10/26/2020   Fall 08/09/2020   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 08/09/2020   Mesenteric lymphadenopathy 06/14/2020   Splenomegaly- mild  06/14/2020   History of cannabis abuse 06/14/2020   Recurrent major depressive disorder, in partial remission (HCC) 06/09/2020    Anxiety 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 06/09/2020   Alcohol use disorder, moderate, dependence (HCC) 06/09/2020   Foraminal stenosis of cervical region 05/09/2020   Diabetic polyneuropathy    ADHD (attention deficit hyperactivity disorder)    Generalized anxiety disorder    Major depressive disorder    Cervical spondylosis 09/22/2019   Hypertension associated with diabetes (HCC) 01/28/2019   Alcoholic cirrhosis of liver 06/02/2018   Type 2 diabetes mellitus with hyperglycemia 04/25/2018   Past Medical History:  Diagnosis Date   ADHD (attention deficit hyperactivity disorder)    Diagnosed during teenage years   Alcohol use disorder    On average 14-21 drinks per week   Alcoholic cirrhosis of liver    Per patient - condition is not related to moderate alcohol use but rather prolonged NSAID use and recent abuse.   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[place]   Major depressive disorder    Other spondylosis with radiculopathy, cervical region    Retinopathy    Right eye; per patient report  Type 2 diabetes mellitus with hyperglycemia    Past Surgical History:  Procedure Laterality Date   ANTERIOR CERVICAL DECOMPRESSION/DISCECTOMY FUSION 4 LEVELS N/A 02/13/2024   Procedure: ANTERIOR CERVICAL DECOMPRESSION/DISCECTOMY FUSION, CERVICAL FOUR - CERVICAL FIVE, CERVICAL FIVE - CERVICAL SIX, CERVICAL SIX - CERVICAL SEVEN, CERVICAL SEVEN - THORACIC ONE;  Surgeon: Colon Shove, MD;  Location: [facility] OR;  Service: Neurosurgery;  Laterality: N/A;   CHOLECYSTECTOMY N/A 06/02/2018    Procedure: LAPAROSCOPIC CHOLECYSTECTOMY;  Surgeon: Nicholaus Selinda Birmingham, MD;  Location: [facility] ORS;  Service: General;  Laterality: N/A;   KNEE ARTHROSCOPY Right    30 Years ago   lumbar five-sacral one posterior lumbar interbody fusion  01/24/2021   LUMBAR LAMINECTOMY/DECOMPRESSION MICRODISCECTOMY Bilateral 10/07/2023   Procedure: Laminectomy for Facet/Synovial Cyst - Lumbar Four-Lumbar Five - Bilateral;  Surgeon: Colon Shove, MD;  Location: [facility] OR;  Service: Neurosurgery;  Laterality: Bilateral;  C3   No Known Allergies       04/03/2024    4:16 PM 04/02/2024    4:17 PM 02/04/2024    2:22 PM  Depression screen PHQ 2/9  Decreased Interest 3 3 3   Down, Depressed, Hopeless 3 3 3   PHQ - 2 Score 6 6 6   Altered sleeping 3 3 3   Tired, decreased energy 3 3 3   Change in appetite 3 3 2   Feeling bad or failure about yourself  3 3 2   Trouble concentrating 3 3 2   Moving slowly or fidgety/restless 3 3 0  Suicidal thoughts 2 0 0  PHQ-9 Score 26 24 18   Difficult doing work/chores Very difficult Very difficult Very difficult       04/03/2024    4:17 PM 02/04/2024    2:35 PM 12/18/2023    3:10 PM 12/16/2023    4:08 PM  GAD 7 : Generalized Anxiety Score  Nervous, Anxious, on Edge 3 3 3 3   Control/stop worrying 3 2 2 3   Worry too much - different things 3 3 2 2   Trouble relaxing 3 3 2 3   Restless 1 1 1 2   Easily annoyed or irritable 2 1 1 1   Afraid - awful might happen 2 1 2 3   Total GAD 7 Score 17 14 13 17   Anxiety Difficulty Somewhat difficult Somewhat difficult Somewhat difficult       Review of Systems  Constitutional:  Negative for chills and fever.  Respiratory:  Negative for shortness of breath.   Cardiovascular:  Negative for chest pain.  Gastrointestinal:  Negative for abdominal pain, constipation, diarrhea, heartburn, nausea and vomiting.  Genitourinary:  Negative for dysuria, frequency and urgency.  Musculoskeletal:  Positive for joint pain.  Neurological:  Negative for dizziness and  headaches.  Endo/Heme/Allergies:  Negative for polydipsia.  Psychiatric/Behavioral:  Negative for depression and suicidal ideas. The patient is not nervous/anxious.       Objective:     BP 136/82   Pulse 96   Temp 98.9 F (37.2 C) (Oral)   Ht 6' 2 (1.88 m)   Wt 213 lb 4 oz (96.7 kg)   SpO2 99%   BMI 27.38 kg/m  BP Readings from Last 3 Encounters:  04/03/24 136/82  02/14/24 (!) 150/94  02/06/24 (!) 163/93   Wt Readings from Last 3 Encounters:  04/03/24 213 lb 4 oz (96.7 kg)  02/13/24 218 lb (98.9 kg)  02/06/24 221 lb 8 oz (100.5 kg)      Physical Exam Vitals and nursing note reviewed.  Constitutional:      Appearance: Normal  appearance.  Cardiovascular:     Rate and Rhythm: Normal rate and regular rhythm.     Pulses: Normal pulses.     Heart sounds: Normal heart sounds.  Pulmonary:     Effort: Pulmonary effort is normal.     Breath sounds: Normal breath sounds.  Musculoskeletal:     Right wrist: No swelling, lacerations or bony tenderness. Decreased range of motion.     Left wrist: Normal.  Neurological:     Mental Status: He is alert and oriented to person, place, and time.  Psychiatric:        Mood and Affect: Mood normal.        Behavior: Behavior normal.        Thought Content: Thought content normal.        Judgment: Judgment normal.      No results found for any visits on 04/03/24.     The 10-year ASCVD risk score (Arnett DK, et al., 2019) is: 13.7%    Assessment & Plan:  Chronic wrist pain, right Assessment & Plan: Acute on chronic.  No recent trauma or injury.  No need for x-ray at this time.   Currently managed by pain management.  As voltaren  as been helping him, will try Voltaren  topical gel to apply to wrist. RX sent.  Referral placed for ortho.   Orders: -     Ambulatory referral to Orthopedics -     Diclofenac  Sodium; Apply 2 g topically 4 (four) times daily.  Dispense: 150 g; Refill: 1 -     Methocarbamol ; Take 1 tablet (500  mg total) by mouth every 6 (six) hours as needed for muscle spasms.  Dispense: 30 tablet; Refill: 0  Dysphonia Assessment & Plan: Following with ENT.  Patient reports this is a possible post op complication.  He had a laryngoscopy and which complete left vocal cord paralysis 3 weeks after anterior approach to the cervical spine.  He is to follow up with ENT in two months if not better, they are going to consider a referral to laryngology for medialization procedure.  Will continue to monitor.      Return in about 3 months (around 07/04/2024) for chronic care management.    Carrol Aurora, NP

## 2024-04-03 NOTE — Assessment & Plan Note (Signed)
 Acute on chronic.  No recent trauma or injury.  No need for x-ray at this time.   Currently managed by pain management.  As voltaren  as been helping him, will try Voltaren  topical gel to apply to wrist. RX sent.  Referral placed for ortho.

## 2024-04-03 NOTE — Assessment & Plan Note (Signed)
 Following with ENT.  Patient reports this is a possible post op complication.  He had a laryngoscopy and which complete left vocal cord paralysis 3 weeks after anterior approach to the cervical spine.  He is to follow up with ENT in two months if not better, they are going to consider a referral to laryngology for medialization procedure.  Will continue to monitor.

## 2024-04-07 ENCOUNTER — Other Ambulatory Visit: Payer: Self-pay

## 2024-04-07 ENCOUNTER — Encounter: Payer: Self-pay | Admitting: Pharmacist

## 2024-04-08 ENCOUNTER — Inpatient Hospital Stay: Payer: Medicaid Other | Attending: Oncology

## 2024-04-08 DIAGNOSIS — R490 Dysphonia: Secondary | ICD-10-CM | POA: Diagnosis not present

## 2024-04-08 DIAGNOSIS — M542 Cervicalgia: Secondary | ICD-10-CM | POA: Diagnosis not present

## 2024-04-08 DIAGNOSIS — F17290 Nicotine dependence, other tobacco product, uncomplicated: Secondary | ICD-10-CM | POA: Diagnosis not present

## 2024-04-08 DIAGNOSIS — C9210 Chronic myeloid leukemia, BCR/ABL-positive, not having achieved remission: Secondary | ICD-10-CM

## 2024-04-08 DIAGNOSIS — C9211 Chronic myeloid leukemia, BCR/ABL-positive, in remission: Secondary | ICD-10-CM | POA: Insufficient documentation

## 2024-04-08 DIAGNOSIS — D649 Anemia, unspecified: Secondary | ICD-10-CM | POA: Diagnosis not present

## 2024-04-08 DIAGNOSIS — R161 Splenomegaly, not elsewhere classified: Secondary | ICD-10-CM | POA: Insufficient documentation

## 2024-04-08 LAB — CBC WITH DIFFERENTIAL/PLATELET
Abs Immature Granulocytes: 0.02 K/uL (ref 0.00–0.07)
Basophils Absolute: 0 K/uL (ref 0.0–0.1)
Basophils Relative: 1 %
Eosinophils Absolute: 0.1 K/uL (ref 0.0–0.5)
Eosinophils Relative: 2 %
HCT: 38.2 % — ABNORMAL LOW (ref 39.0–52.0)
Hemoglobin: 11.6 g/dL — ABNORMAL LOW (ref 13.0–17.0)
Immature Granulocytes: 0 %
Lymphocytes Relative: 18 %
Lymphs Abs: 1.3 K/uL (ref 0.7–4.0)
MCH: 23.5 pg — ABNORMAL LOW (ref 26.0–34.0)
MCHC: 30.4 g/dL (ref 30.0–36.0)
MCV: 77.5 fL — ABNORMAL LOW (ref 80.0–100.0)
Monocytes Absolute: 0.6 K/uL (ref 0.1–1.0)
Monocytes Relative: 8 %
Neutro Abs: 5.2 K/uL (ref 1.7–7.7)
Neutrophils Relative %: 71 %
Platelets: 308 K/uL (ref 150–400)
RBC: 4.93 MIL/uL (ref 4.22–5.81)
RDW: 15.9 % — ABNORMAL HIGH (ref 11.5–15.5)
WBC: 7.2 K/uL (ref 4.0–10.5)
nRBC: 0 % (ref 0.0–0.2)

## 2024-04-08 LAB — CMP (CANCER CENTER ONLY)
ALT: 26 U/L (ref 0–44)
AST: 32 U/L (ref 15–41)
Albumin: 3.4 g/dL — ABNORMAL LOW (ref 3.5–5.0)
Alkaline Phosphatase: 93 U/L (ref 38–126)
Anion gap: 8 (ref 5–15)
BUN: 18 mg/dL (ref 6–20)
CO2: 23 mmol/L (ref 22–32)
Calcium: 8.8 mg/dL — ABNORMAL LOW (ref 8.9–10.3)
Chloride: 107 mmol/L (ref 98–111)
Creatinine: 0.77 mg/dL (ref 0.61–1.24)
GFR, Estimated: >60 mL/min (ref >60)
Glucose, Bld: 128 mg/dL — ABNORMAL HIGH (ref 70–99)
Potassium: 4.1 mmol/L (ref 3.5–5.1)
Sodium: 138 mmol/L (ref 135–145)
Total Bilirubin: 0.6 mg/dL (ref 0.0–1.2)
Total Protein: 5.9 g/dL — ABNORMAL LOW (ref 6.5–8.1)

## 2024-04-08 LAB — MAGNESIUM: Magnesium: 2.4 mg/dL (ref 1.7–2.4)

## 2024-04-08 LAB — PHOSPHORUS: Phosphorus: 3.6 mg/dL (ref 2.5–4.6)

## 2024-04-09 ENCOUNTER — Telehealth: Payer: Self-pay | Admitting: Pharmacist

## 2024-04-09 ENCOUNTER — Other Ambulatory Visit (HOSPITAL_COMMUNITY): Payer: Self-pay

## 2024-04-09 ENCOUNTER — Telehealth: Payer: Self-pay | Admitting: Pharmacy Technician

## 2024-04-09 ENCOUNTER — Inpatient Hospital Stay: Payer: Medicaid Other | Admitting: Oncology

## 2024-04-09 ENCOUNTER — Other Ambulatory Visit: Payer: Self-pay | Admitting: Pharmacy Technician

## 2024-04-09 ENCOUNTER — Other Ambulatory Visit: Payer: Self-pay

## 2024-04-09 DIAGNOSIS — R490 Dysphonia: Secondary | ICD-10-CM

## 2024-04-09 DIAGNOSIS — C9210 Chronic myeloid leukemia, BCR/ABL-positive, not having achieved remission: Secondary | ICD-10-CM

## 2024-04-09 DIAGNOSIS — R161 Splenomegaly, not elsewhere classified: Secondary | ICD-10-CM | POA: Diagnosis not present

## 2024-04-09 DIAGNOSIS — M542 Cervicalgia: Secondary | ICD-10-CM

## 2024-04-09 DIAGNOSIS — M549 Dorsalgia, unspecified: Secondary | ICD-10-CM

## 2024-04-09 DIAGNOSIS — D649 Anemia, unspecified: Secondary | ICD-10-CM

## 2024-04-09 MED ORDER — IMATINIB MESYLATE 400 MG PO TABS
400.0000 mg | ORAL_TABLET | Freq: Every day | ORAL | 5 refills | Status: AC
  Filled 2024-04-09: qty 30, 30d supply, fill #0
  Filled 2024-04-30: qty 30, 30d supply, fill #1
  Filled 2024-06-01: qty 30, 30d supply, fill #2
  Filled 2024-07-02 – 2024-07-21 (×3): qty 30, 30d supply, fill #3
  Filled 2024-08-19 – 2024-08-24 (×2): qty 30, 30d supply, fill #4

## 2024-04-09 NOTE — Telephone Encounter (Signed)
 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  Patient was previously on medication, but is appears the last time the pharmacy sent him medication was on 04/02/23. Patient will be re-established with the pharmacy.   CMP from 04/08/24 assessed, no relevant lab abnormalities. Prescription dose and frequency assessed.   Current medication list in Epic reviewed, no relevant DDIs with imatinib  identified.   Evaluated chart and no patient barriers to medication adherence identified.   Prescription has been e-scribed to the [facility] for benefits analysis and approval.  Oral Oncology Clinic will continue to follow for insurance authorization, copayment issues, initial counseling and start date.   Gerald Olson N. Harrietta Incorvaia, PharmD, BCOP, CPP Hematology/Oncology Clinical Pharmacist [facility] [phone]  04/09/2024 2:39 PM

## 2024-04-09 NOTE — Progress Notes (Signed)
 Specialty Pharmacy Initiation Note   Gerald Olson is a 58 y.o. male who will be followed by the specialty pharmacy service for RxSp Oncology    Review of administration, indication, effectiveness, safety, potential side effects, storage/disposable, and missed dose instructions occurred today for patient's specialty medication(s) Imatinib  Mesylate (GLEEVEC )     Patient/Caregiver did not have any additional questions or concerns.   No data recorded   Goals Addressed   None     Gerald Olson Specialty Pharmacist

## 2024-04-09 NOTE — Telephone Encounter (Signed)
 Patient successfully OnBoarded and drug education provided by pharmacist. Medication scheduled to be shipped on 08/15 for delivery on 08/18 from [facility] to patient's address. Patient also knows to call me at [phone] with any questions or concerns regarding receiving medication or if there is any unexpected change in co-pay.   Gerald Olson (Patty) Chet Burnet, CPhT  [facility], [place], [facility] Patient Advocate Specialist III Phone: [phone]  Fax: [phone]

## 2024-04-09 NOTE — Progress Notes (Signed)
 Specialty Pharmacy Initial Fill Coordination Note  Gerald Olson is a 58 y.o. male contacted today regarding refills of specialty medication(s) Imatinib  Mesylate (GLEEVEC ) .  Patient requested Delivery  on 04/13/24  to verified address [address] [facility] [place] [address]   Medication will be filled on 08/15.   Patient is aware of $4 copayment. Patient requests bill to [employer].  Gerald Olson (Patty) Chet Burnet, CPhT  [facility], [place], [facility] Patient Advocate Specialist III Phone: [phone]  Fax: [phone]

## 2024-04-09 NOTE — Telephone Encounter (Signed)
 Oral Oncology Patient Advocate Encounter  After completing a benefits investigation, prior authorization for Imatinib  is not required at this time through [facility].  Patient's copay is $4.     Orson Rho (Patty) Chet Burnet, CPhT  [facility], [place], [place] Oral Chem[facility] Patient Advocate Specialist III Phone: [phone]  Fax: [phone]

## 2024-04-09 NOTE — Progress Notes (Signed)
 [facility]  Telephone:([phone]) [phone] Fax:([phone]) [phone]  ID: Gerald Olson OB: [birth year]  MR#: [id number]  RDW#:[id number]  Patient Care Team: [name], NP as PCP - General (General Practice) Jacobo Evalene PARAS, MD as Consulting Physician (Oncology) Sherren Olam BRAVO, LCSW as Social Worker (Licensed Clinical Social Worker) Devra Lands, RN as VBCI Care Management  I connected with Gerald Olson on 04/10/24 at  2:15 PM EDT by video enabled telemedicine visit and verified that I am speaking with the correct person using two identifiers.   I discussed the limitations, risks, security and privacy concerns of performing an evaluation and management service by telemedicine and the availability of in-person appointments. I also discussed with the patient that there may be a patient responsible charge related to this service. The patient expressed understanding and agreed to proceed.   Other persons participating in the visit and their role in the encounter: Patient, MD.  Patient's location: Home. Provider's location: Clinic.  CHIEF COMPLAINT: CML.  INTERVAL HISTORY: Patient agreed to video-assisted telemedicine visit for routine 51-month evaluation and discussion of his laboratory results.  He recently underwent neck surgery and continues to be in a significant amount of pain.  He also has hoarseness of voice secondary to his surgery.  He continues to have financial difficulties with obtaining medications and reports he has not taken Gleevac in nearly 3 months.  He has no neurologic complaints.  He denies any recent fevers or illnesses. He has a fair appetite and denies weight loss. He has no chest pain, shortness of breath, cough, or hemoptysis. He denies any nausea, vomiting, constipation, or diarrhea. He has no melena or hematochezia. He has no urinary complaints.  Patient offers no further specific complaints today.  REVIEW OF SYSTEMS:   Review of Systems   Constitutional: Negative.  Negative for fever, malaise/fatigue and weight loss.  Respiratory: Negative.  Negative for cough, hemoptysis and shortness of breath.   Cardiovascular: Negative.  Negative for chest pain and leg swelling.  Gastrointestinal: Negative.  Negative for abdominal pain, blood in stool and diarrhea.  Genitourinary: Negative.  Negative for dysuria, flank pain and hematuria.  Musculoskeletal:  Positive for back pain and neck pain.  Skin: Negative.  Negative for rash.  Neurological: Negative.  Negative for dizziness, focal weakness, weakness and headaches.  Psychiatric/Behavioral: Negative.  The patient is not nervous/anxious.     As per HPI. Otherwise, a complete review of systems is negative.  PAST MEDICAL HISTORY: Past Medical History:  Diagnosis Date   ADHD (attention deficit hyperactivity disorder)    Diagnosed during teenage years   Alcohol use disorder    On average 14-21 drinks per week   Alcoholic cirrhosis of liver    Per patient - condition is not related to moderate alcohol use but rather prolonged NSAID use and recent abuse.   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[place]   Major depressive disorder    Other spondylosis with radiculopathy, cervical region    Retinopathy  Right eye; per patient report   Type 2 diabetes mellitus with hyperglycemia     PAST SURGICAL HISTORY: Past Surgical History:  Procedure Laterality Date   ANTERIOR CERVICAL DECOMPRESSION/DISCECTOMY FUSION 4 LEVELS N/A 02/13/2024   Procedure: ANTERIOR CERVICAL  DECOMPRESSION/DISCECTOMY FUSION, CERVICAL FOUR - CERVICAL FIVE, CERVICAL FIVE - CERVICAL SIX, CERVICAL SIX - CERVICAL SEVEN, CERVICAL SEVEN - THORACIC ONE;  Surgeon: Colon Shove, MD;  Location: [facility] OR;  Service: Neurosurgery;  Laterality: N/A;   CHOLECYSTECTOMY N/A 06/02/2018   Procedure: LAPAROSCOPIC CHOLECYSTECTOMY;  Surgeon: Nicholaus Selinda Birmingham, MD;  Location: [facility] ORS;  Service: General;  Laterality: N/A;   KNEE ARTHROSCOPY Right    30 Years ago   lumbar five-sacral one posterior lumbar interbody fusion  01/24/2021   LUMBAR LAMINECTOMY/DECOMPRESSION MICRODISCECTOMY Bilateral 10/07/2023   Procedure: Laminectomy for Facet/Synovial Cyst - Lumbar Four-Lumbar Five - Bilateral;  Surgeon: Colon Shove, MD;  Location: [facility] OR;  Service: Neurosurgery;  Laterality: Bilateral;  C3    FAMILY HISTORY: Family History  Problem Relation Age of Onset   Aneurysm Father    Drug abuse Sister    Multiple sclerosis Sister    Suicidality Maternal Aunt    Emphysema Maternal Grandfather    Diabetes Neg Hx     ADVANCED DIRECTIVES (Y/N):  N  HEALTH MAINTENANCE: Social History   Tobacco Use   Smoking status: Former    Current packs/day: 0.00    Types: Cigarettes    Quit date: 06/25/2003    Years since quitting: 20.8   Smokeless tobacco: Never   Tobacco comments:    Currently vapes CBD oil  Vaping Use   Vaping status: Every Day   Substances: CBD  Substance Use Topics   Alcohol use: Not Currently    Alcohol/week: 42.0 - 56.0 standard drinks of alcohol    Types: 42 - 56 Cans of beer per week    Comment: 6-8 beers per day   Drug use: Yes    Types: Marijuana    Comment: twice/week to help sleep; edibles, does not smoke     Colonoscopy:  PAP:  Bone density:  Lipid panel:  No Known Allergies   Current Outpatient Medications  Medication Sig Dispense Refill   ALPHA LIPOIC ACID PO Take 4 capsules by mouth daily with lunch.     aspirin 325 MG tablet Take 1,300 mg by mouth every 8 (eight) hours  as needed. (Patient taking differently: Take 1,300 mg by mouth every 8 (eight) hours as needed. States rotates with naproxen and ibuprofen )     BLACK CURRANT SEED OIL PO Take 1 capsule by mouth daily with lunch.     Cholecalciferol (VITAMIN D -3 PO) Take 5,000 Units by mouth daily with lunch. (Patient taking differently: Take 5,000 Units by mouth daily with lunch. Taking 3 tabs daily - advised to take as directed)     cyclobenzaprine  (FLEXERIL ) 5 MG tablet Take 1 tablet (5 mg total) by mouth 2 (two) times daily as needed for muscle spasms. 20 tablet 0   dapagliflozin  propanediol (FARXIGA ) 5 MG TABS tablet Take 1 tablet (5 mg total) by mouth daily before breakfast. 90 tablet 3   diclofenac  (VOLTAREN ) 50 MG EC tablet Take 50 mg by mouth daily as needed (pain).     diclofenac  Sodium (VOLTAREN  ARTHRITIS PAIN) 1 % GEL Apply 2 g topically 4 (four) times daily. 150 g 1   glucose blood (ACCU-CHEK GUIDE) test strip USE AS DIRECTED 2 TIMES DAILY 180 strip 0   ibuprofen  (ADVIL ) 200  MG tablet Take 800-1,200 mg by mouth 2 (two) times daily as needed (pain.).     imatinib  (GLEEVEC ) 400 MG tablet Take 1 tablet (400 mg total) by mouth daily. Take with meals and large glass of water.Caution:Chemotherapy. 30 tablet 5   insulin  glargine (LANTUS  SOLOSTAR) 100 UNIT/ML Solostar Pen Inject 7 Units into the skin every morning. 15 mL 3   Insulin  Pen Needle (TECHLITE PEN NEEDLES) 32G X 4 MM MISC Use to inject Victoza  once a day 100 each 11   MAGNESIUM COMPLEX HIGH POTENCY PO Take 1 capsule by mouth in the morning.     methocarbamol  (ROBAXIN ) 500 MG tablet Take 1 tablet (500 mg total) by mouth every 6 (six) hours as needed for muscle spasms. 30 tablet 0   Multiple Vitamins-Minerals (ALIVE MENS GUMMY MULTIVITAMINS PO) Take 4 each by mouth in the morning. (Patient taking differently: Take 4 each by mouth in the morning. Using centrum)     naproxen sodium (ALEVE) 220 MG tablet Take 660 mg by mouth 2 (two) times daily as needed  (pain.).     OVER THE COUNTER MEDICATION Take 5 mLs by mouth every 3 (three) hours as needed (pain). Wild Lettuce Tincture     pregabalin  (LYRICA ) 100 MG capsule Take 1 capsule (100 mg total) by mouth 2 (two) times daily. (Patient taking differently: Take 100 mg by mouth at bedtime as needed (pain.).) 60 capsule 2   TURMERIC-GINGER PO Take 3 capsules by mouth in the morning. With Garlic     zinc sulfate, 50mg  elemental zinc, 220 (50 Zn) MG capsule Take 220 mg by mouth daily after lunch.     No current facility-administered medications for this visit.    OBJECTIVE: There were no vitals filed for this visit.    There is no height or weight on file to calculate BMI.    ECOG FS:1 - Symptomatic but completely ambulatory  General: Well-developed, well-nourished, no acute distress. HEENT: Normocephalic. Neuro: Alert, answering all questions appropriately. Cranial nerves grossly intact. Psych: Normal affect.  LAB RESULTS:  Lab Results  Component Value Date   NA 138 04/08/2024   K 4.1 04/08/2024   CL 107 04/08/2024   CO2 23 04/08/2024   GLUCOSE 128 (H) 04/08/2024   BUN 18 04/08/2024   CREATININE 0.77 04/08/2024   CALCIUM 8.8 (L) 04/08/2024   PROT 5.9 (L) 04/08/2024   ALBUMIN  3.4 (L) 04/08/2024   AST 32 04/08/2024   ALT 26 04/08/2024   ALKPHOS 93 04/08/2024   BILITOT 0.6 04/08/2024   GFRNONAA >60 04/08/2024   GFRAA 90 09/14/2020    Lab Results  Component Value Date   WBC 7.2 04/08/2024   NEUTROABS 5.2 04/08/2024   HGB 11.6 (L) 04/08/2024   HCT 38.2 (L) 04/08/2024   MCV 77.5 (L) 04/08/2024   PLT 308 04/08/2024     STUDIES: No results found.    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  CT scan results from July 18, 2020 reviewed independently with only mild abdominal lymphadenopathy and mild splenomegaly that are chronic and unchanged.  Bone marrow  biopsy was negative for disease.  Patient was initiated on 400 mg Gleevec  in approximately November 2021 and has been in and continues to be in a complete molecular remission since January 2022.  His compliance to treatment has been erratic mostly secondary to financial constraints and patient reports  he has not taken any Gleevac for 3 months.  Despite this, he remains in complete molecular remission.  His most recent BCR-ABL mutation from Jan 07, 2024 continue to reveal no evidence of disease.  Today's result is pending at time of dictation.  Although patient is greater than 3 years removed for being in a molecular remission, will continue Gleevac for at least another 6 months given patient's noncompliance.  Continue Gleevec  400 mg daily.  Return to clinic in 6 months for repeat laboratory work, evaluation, and consideration of discontinuing treatment.  Appreciate clinical pharmacy input.   Anemia: Hemoglobin is trended down to 11.6, monitor.  Suspect this may be related to some blood loss with his recent surgeries.   Splenomegaly/mesenteric lymphadenopathy: Unlikely related to CML. Back/neck pain: Patient has now undergone back surgery and neck surgery and reports minimal relief of his pain.  Continue follow-up with neurosurgery as scheduled. Hoarseness of voice: Patient reports this is from nerve damage from his recent surgery.  I provided 30 minutes of face-to-face video visit time during this encounter which included chart review, counseling, and coordination of care as documented above.  Patient expressed understanding and was in agreement with this plan. He also understands that He can call clinic at any time with any questions, concerns, or complaints.    Evalene JINNY Reusing, MD   04/10/2024 12:49 PM

## 2024-04-10 ENCOUNTER — Other Ambulatory Visit: Payer: Self-pay

## 2024-04-13 DIAGNOSIS — R1319 Other dysphagia: Secondary | ICD-10-CM | POA: Diagnosis not present

## 2024-04-13 DIAGNOSIS — J3801 Paralysis of vocal cords and larynx, unilateral: Secondary | ICD-10-CM | POA: Diagnosis not present

## 2024-04-13 DIAGNOSIS — R490 Dysphonia: Secondary | ICD-10-CM | POA: Diagnosis not present

## 2024-04-14 LAB — BCR-ABL1, CML/ALL, PCR, QUANT
E1A2 Transcript: <0.0032 %
Interpretation (BCRAL):: NEGATIVE
b2a2 transcript: <0.0032 %
b3a2 transcript: <0.0032 %

## 2024-04-15 ENCOUNTER — Other Ambulatory Visit: Payer: Self-pay

## 2024-04-15 DIAGNOSIS — F32A Depression, unspecified: Secondary | ICD-10-CM

## 2024-04-15 NOTE — Patient Outreach (Signed)
 Complex Care Management   Visit Note  04/15/2024  Name:  Gerald Olson MRN: [id number] DOB: 09/05/1965  Situation: Referral received for Complex Care Management related to Chronic pain I obtained verbal consent from Patient.  Visit completed with Patient  on the phone  Background:   Past Medical History:  Diagnosis Date   ADHD (attention deficit hyperactivity disorder)    Diagnosed during teenage years   Alcohol use disorder    On average 14-21 drinks per week   Alcoholic cirrhosis of liver    Per patient - condition is not related to moderate alcohol use but rather prolonged NSAID use and recent abuse.   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[place]   Major depressive disorder    Other spondylosis with radiculopathy, cervical region    Retinopathy    Right eye; per patient report   Type 2 diabetes mellitus with hyperglycemia     Assessment: Patient Reported Symptoms:  Cognitive Cognitive Status: Other: (states adult ADHD - difficulty focus requires frequent redirection to stay on topic and focus on task at hand) Cognitive/Intellectual Conditions Management [RPT]: None reported or documented in medical history or problem list   Health Maintenance Behaviors: Annual physical exam Healing Pattern: Average  Neurological [place] Neurological Symptoms/Conditions [RPT]: 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 - referred to [facility] - neurosurgery appointment this month, left arm very limited use weakness and   right wrist down paralysis and left arm  numb/paralysed when first waking per patient, Neurological Management Strategies: Medication therapy, Routine screening Neurological Self-Management Outcome: 3 (uncertain)  HEENT HEENT Symptoms Reported: Other: HEENT Comment: vocal cord paralysis - difficulty swallowing - adjusted accordingly, retinopathy right eye, Vision problem(s)  Cardiovascular Cardiovascular Symptoms Reported: No symptoms reported Cardiovascular Comment: states BP usually 120's over 80-90  Respiratory Respiratory Symptoms Reported: Dry cough Other Respiratory Symptoms: occasional cough due to issues swallowing Respiratory Management Strategies: Routine screening  Endocrine Endocrine Symptoms Reported: No symptoms reported Is patient diabetic?: Yes Is patient checking blood sugars at home?: No List most recent blood sugar readings, include date and time of day: not checking regularly - checked during visit 139 denies feelings of high/low Endocrine Comment: Endo 10/9  Gastrointestinal Gastrointestinal Symptoms Reported: No symptoms reported Additional Gastrointestinal Details: swallowing issues at times due to vocal cord paralyisis Gastrointestinal Management Strategies: Medication therapy    Genitourinary Genitourinary Symptoms Reported: No symptoms reported Other Genitourinary Symptoms: no changes, remains foul smelling and light gray - drinks about 6 beers daily, takes multiple supplements he believes affect color    Integumentary Integumentary Symptoms Reported: No symptoms reported Additional Integumentary Details: treating toenails with OTC for fungus    Musculoskeletal Additional Musculoskeletal Details: pain neck, right shoulder pain worsening, states right wrist/hand paralysed, and left arm numb on waking but improves during day, balance better - determined patient was wearing readers which caused issues walking around Musculoskeletal Management Strategies: Routine screening, Medication therapy Falls in the  past year?: Yes Number of falls in past year: 2  or more Was there an injury with Fall?: No Fall Risk Category Calculator: 2 Patient Fall Risk Level: Moderate Fall Risk Patient at Risk for Falls Due to: History of fall(s), Impaired balance/gait, Orthopedic patient Fall risk Follow up: Falls evaluation completed, Falls prevention discussed  Psychosocial Psychosocial Symptoms Reported: Depression - if selected complete PHQ 2-9 Additional Psychological Details: Agreed to talk to LCSW - scheduled Behavioral Health Comment: ETOH - 6 beers daily, marijuana to sleep Major Change/Loss/Stressor/Fears (CP): Medical condition, self, Environment Quality of Family Relationships: supportive    04/15/2024    PHQ2-9 Depression Screening   Little interest or pleasure in doing things Nearly every day  Feeling down, depressed, or hopeless Nearly every day  PHQ-2 - Total Score 6  Trouble falling or staying asleep, or sleeping too much Nearly every day  Feeling tired or having little energy Nearly every day  Poor appetite or overeating  Nearly every day  Feeling bad about yourself - or that you are a failure or have let yourself or your family down Nearly every day  Trouble concentrating on things, such as reading the newspaper or watching television Nearly every day (Adult ADD)  Moving or speaking so slowly that other people could have noticed.  Or the opposite - being so fidgety or restless that you have been moving around a lot more than usual Nearly every day  Thoughts that you would be better off dead, or hurting yourself in some way More than half the days (thoughts would be better off dead when waking then goes away, no intent to harm self)  PHQ2-9 Total Score 26  If you checked off any problems, how difficult have these problems made it for you to do your work, take care of things at home, or get along with other people Very difficult  Depression Interventions/Treatment  (referral to LCSW)    There  were no vitals filed for this visit.  Medications Reviewed Today   Medications were not reviewed in this encounter     Recommendation:   PCP Follow-up  Follow Up Plan:   Telephone follow-up in 1 month LCSW 04/20/2024 at 2:00 pm   [name], MSN, RN [facility]  [facility], [facility] RN Care Manager Direct Dial : [phone] Fax: [phone]

## 2024-04-15 NOTE — Patient Instructions (Signed)
 Visit Information  Gerald Olson was given information about Medicaid Managed Care team care coordination services as a part of their Healthy [facility] Medicaid benefit. [name]   If you would like to schedule transportation through your Healthy [facility] plan, please call the following number at least 2 days in advance of your appointment: [phone]  For information about your ride after you set it up, call Ride Assist at [phone]. Use this number to activate a Will Call pickup, or if your transportation is late for a scheduled pickup. Use this number, too, if you need to make a change or cancel a previously scheduled reservation.  If you need transportation services right away, call [phone]. The after-hours call center is staffed 24 hours to handle ride assistance and urgent reservation requests (including discharges) 365 days a year. Urgent trips include sick visits, hospital discharge requests and life-sustaining treatment.  Call the [facility] Line at [phone], at any time, 24 hours a day, 7 days a week. If you are in danger or need immediate medical attention call 911.   Gerald Olson - following are the goals we discussed in your visit today:   Goals Addressed             This Visit's Progress    VBCI RN Care Plan- Chronic right arm/wrist pain /inability to use./ back pain   On track    Problems:  Chronic Disease Management support and education needs related to right arm/ back pain Financial Constraints with limited social support and reluctance to engage with housing resources and therapy.  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  Continued ETOH use - approx 4 beers daily and Vapes CBD oil - discussed affect of ETOH on liver, interaction with medications, vaping and lungs/throat  Goal: Over the next 3 months the Patient will attend all scheduled medical appointments: with provider as evidenced  by patient report /chart review.         continue to work with Medical illustrator and/or Social Worker to address care management and care coordination needs related to right arm/ wrist pain as evidenced by adherence to care management team scheduled appointments     demonstrate Ongoing adherence to prescribed treatment plan for right arm/ wrist pain as evidenced by patient report/ chart review. take all medications exactly as prescribed and will call provider for medication related questions as evidenced by patient report / chart review.      Interventions:   Evaluation of current treatment plan related to right shoulder & wrist/ arm pain, also left arm numbness at times,  self-management and patient's adherence to plan as established by provider. Patient continues to report 6 beers daily, Vapes CBD at night for sleep. Added herbal supplements.  Discussed plans with patient for ongoing care management follow up and provided patient with direct contact information for care management team Advised to keep follow up visit with neurologist - called Neurosurgery with patient on line - received call back to notify of change in symptoms. Called PCP with patient on line and made appointment for tomorrow regarding referral to ortho regarding Right wrist. Patient states has transportation to appointment.  Advised to notify provider for worsening symptoms.  Advised to take medications as prescribed . Called pharmacy with patient on line and assisted to renew medications. Verified medications delivered   Referred to social worker due to unsafe housing conditions: mold, leaking roof, collapsed basement. Patient  declines to utilize the resources provided at this time, receiving assistance with repairs from neighbor/friend. Patient reports improvement in living conditions re ceiling - help from neighbor Discussed reducing ETOH due to impact on liver and interactions with meds,  reduce vaping due to impact on lungs and  throat - patient declines Encouraged to get assist from friends/mother regarding place to live while house being repaired.  Aware LCSW appointment can be rescheduled when ready to talk. Agreed to LCSW appointment and discussed being open to information, patient with psych degree and frequently shares how he tries to treat himself with analysis and CBT. Appointment 05/02/23 at 2:00 pm.   Patient Self-Care Activities:  Attend all scheduled provider appointments Call pharmacy for medication refills 3-7 days in advance of running out of medications Call provider office for new concerns or questions  Take medications as prescribed   Keep follow up visit with neurologist and report worsening symptoms.  Keep appointment with LCSW Consider reducing ETOH and marijuana use   Plan:  Telephone follow up appointment with care management team member scheduled for:  05/13/2024 at 2:00 pm             Please see education materials related to ETOH, Depression provided by MyChart link.  Patient verbalizes understanding of instructions and care plan provided today and agrees to view in MyChart. Active MyChart status and patient understanding of how to access instructions and care plan via MyChart confirmed with patient.     Telephone follow up appointment with Managed Medicaid care management team member scheduled for: 05/13/2024 at 2:00 pm  [name], MSN, RN [place]  [facility], [facility] RN Care Manager Direct Dial : [phone] Fax: [phone]  Managing Depression, Adult Depression is a mental health condition that affects your thoughts, feelings, and actions. Being diagnosed with depression can bring you relief if you did not know why you have felt or behaved a certain way. It could also leave you feeling overwhelmed. Finding ways to manage your symptoms can help you feel more positive about your future. How to manage lifestyle changes Being depressed is  difficult. Depression can increase the level of everyday stress. Stress can make depression symptoms worse. You may believe your symptoms cannot be managed or will never improve. However, there are many things you can try to help manage your symptoms. There is hope. Managing stress  Stress is your body's reaction to life changes and events, both good and bad. Stress can add to your feelings of depression. Learning to manage your stress can help lessen your feelings of depression. Try some of the following approaches to reducing your stress (stress reduction techniques): Listen to music that you enjoy and that inspires you. Try using a meditation app or take a meditation class. Develop a practice that helps you connect with your spiritual self. Walk in nature, pray, or go to a place of worship. Practice deep breathing. To do this, inhale slowly through your nose. Pause at the top of your inhale for a few seconds and then exhale slowly, letting yourself relax. Repeat this three or four times. Practice yoga to help relax and work your muscles. Choose a stress reduction technique that works for you. These techniques take time and practice to develop. Set aside 5-15 minutes a day to do them. Therapists can offer training in these techniques. Do these things to help manage stress: Keep a journal. Know your limits. Set healthy boundaries for yourself and others, such as saying no when  you think something is too much. Pay attention to how you react to certain situations. You may not be able to control everything, but you can change your reaction. Add humor to your life by watching funny movies or shows. Make time for activities that you enjoy and that relax you. Spend less time using electronics, especially at night before bed. The light from screens can make your brain think it is time to get up rather than go to bed.  Medicines Medicines, such as antidepressants, are often a part of treatment for  depression. Talk with your pharmacist or health care provider about all the medicines, supplements, and herbal products that you take, their possible side effects, and what medicines and other products are safe to take together. Make sure to report any side effects you may have to your health care provider. Relationships Your health care provider may suggest family therapy, couples therapy, or individual therapy as part of your treatment. How to recognize changes Everyone responds differently to treatment for depression. As you recover from depression, you may start to: Have more interest in doing activities. Feel more hopeful. Have more energy. Eat a more regular amount of food. Have better mental focus. It is important to recognize if your depression is not getting better or is getting worse. The symptoms you had in the beginning may return, such as: Feeling tired. Eating too much or too little. Sleeping too much or too little. Feeling restless, agitated, or hopeless. Trouble focusing or making decisions. Having unexplained aches and pains. Feeling irritable, angry, or aggressive. If you or your family members notice these symptoms coming back, let your health care provider know right away. Follow these instructions at home: Activity Try to get some form of exercise each day, such as walking. Try yoga, mindfulness, or other stress reduction techniques. Participate in group activities if you are able. Lifestyle Get enough sleep. Cut down on or stop using caffeine, tobacco, alcohol, and any other harmful substances. Eat a healthy diet that includes plenty of vegetables, fruits, whole grains, low-fat dairy products, and lean protein. Limit foods that are high in solid fats, added sugar, or salt (sodium). General instructions Take over-the-counter and prescription medicines only as told by your health care provider. Keep all follow-up visits. It is important for your health care  provider to check on your mood, behavior, and medicines. Your health care provider may need to make changes to your treatment. Where to find support Talking to others  Friends and family members can be sources of support and guidance. Talk to trusted friends or family members about your condition. Explain your symptoms and let them know that you are working with a health care provider to treat your depression. Tell friends and family how they can help. Finances Find mental health providers that fit with your financial situation. Talk with your health care provider if you are worried about access to food, housing, or medicine. Call your insurance company to learn about your co-pays and prescription plan. Where to find more information You can find support in your area from: Anxiety and Depression Association of America (ADAA): adaa.org Mental Health America: mentalhealthamerica.net [employer] on Mental Illness: nami.org Contact a health care provider if: You stop taking your antidepressant medicines, and you have any of these symptoms: Nausea. Headache. Light-headedness. Chills and body aches. Not being able to sleep (insomnia). You or your friends and family think your depression is getting worse. Get help right away if: You have thoughts of hurting  yourself or others. Get help right away if you feel like you may hurt yourself or others, or have thoughts about taking your own life. Go to your nearest emergency room or: Call 911. Call the National Suicide Prevention Lifeline at (418)471-4110 or 988. This is open 24 hours a day. Text the Crisis Text Line at 424-232-1884. This information is not intended to replace advice given to you by your health care provider. Make sure you discuss any questions you have with your health care provider. Document Revised: 12/19/2021 Document Reviewed: 12/19/2021 Elsevier Patient Education  2024 Elsevier Inc. Alcohol Use Disorder Alcohol use disorder  is a condition in which drinking disrupts daily life. People with this condition drink too much alcohol and cannot control their drinking. Alcohol use disorder can cause serious problems with physical health. It can affect the brain, heart, and other internal organs. This disorder can raise the risk for certain cancers and cause problems with mental health, such as depression or anxiety. What are the causes? This condition is caused by drinking too much alcohol over time. Some people with this condition drink to cope with or escape from negative life events. Others drink to relieve symptoms of physical pain or symptoms of mental illness. What increases the risk? You are more likely to develop this condition if: You have a family history of alcohol use disorder. Your culture encourages drinking to the point of becoming drunk (intoxication). You had a mood or conduct disorder in childhood. You have been abused. You are an adolescent and you: Have poor performance in school. Have poor supervision or guidance. Act on impulse and like taking risks. What are the signs or symptoms? Symptoms of this condition include: Drinking more than you want to. Trying several times without success to drink less. Spending a lot of time thinking about alcohol, getting alcohol, drinking alcohol, or recovering from drinking alcohol. Continuing to drink even when it is causing serious problems in your daily life. Drinking when it is dangerous to drink, such as before driving a car. Needing more and more alcohol to get the same effect you want (building up tolerance). Having symptoms of withdrawal when you stop drinking. Withdrawal symptoms may include: Trouble sleeping, leading to tiredness (fatigue). Mood swings of depression and anxiety. Physical symptoms, such as a fast heart rate, rapid breathing, high blood pressure (hypertension), fever, cold sweats, or nausea. Seizures. Severe confusion. Feeling or seeing  things that are not there (hallucinations). Shaking movements that you cannot control (tremors). How is this diagnosed? This condition is diagnosed with an assessment. Your health care provider may start by asking three or four questions about your drinking, or they may give you a simple test to take. This helps to get clear information from you. You may also have a physical exam or lab tests. You may be referred to a substance abuse counselor. How is this treated? With education, some people with alcohol use disorder are able to reduce their drinking. Many with this disorder cannot change their drinking behavior on their own and need help with treatment from substance use specialists. Treatments may include: Detoxification. Detoxification involves quitting drinking with supervision and direction of health care providers. Your health care provider may prescribe medicines within the first week to help lessen withdrawal symptoms. Alcohol withdrawal can be dangerous and life-threatening. Detoxification may be provided in a home, community, or primary care setting, or in a hospital or substance use treatment facility. Counseling. This may involve motivational interviewing (MI), family therapy, or  cognitive behavioral therapy (CBT). A counselor can address the things you can do to change your drinking behavior and how to maintain the changes. Talk therapy aims to: Identify your positive motivations to change. Identify and avoid the things that trigger your drinking. Help you learn how to plan your behavior change. Develop support systems that can help you sustain the change. Medicines. Medicines can help treat this disorder by: Decreasing cravings. Decreasing the positive feeling you have when you drink. Causing an uncomfortable physical reaction when you drink (aversion therapy). Support groups such as Alcoholics Anonymous (AA). These groups are led by people who have quit drinking. The groups provide  emotional support, advice, and guidance. Some people with this condition benefit from a combination of treatments provided by specialized substance use treatment centers. Follow these instructions at home:  Medicines Take over-the-counter and prescription medicines only as told by your health care provider. Ask before starting any new medicines, herbs, or supplements. General instructions Ask friends and family members to support your choice to stay sober. Avoid places where alcohol is served. Create a plan to deal with tempting situations. Attend support groups regularly. Practice hobbies or activities you enjoy. Do not drink and drive. How is this prevented? Do not drink alcohol if your health care provider tells you not to drink. If you drink alcohol: Limit how much you have to: 0-1 drink a day for women who are not pregnant. 0-2 drinks a day for men. Know how much alcohol is in your drink. In the [place] one drink equals one 12 oz bottle of beer (355 mL), one 5 oz glass of wine (148 mL), or one 1 oz glass of hard liquor (44 mL). If you have a mental health condition, seek treatment. Develop a healthy lifestyle through: Meditation or deep breathing. Exercise. Spending time in nature. Listening to music. Talking with a trusted friend or family member. If you are a teen: Do not drink alcohol. Avoid gatherings where you might be tempted to drink alcohol. Do not be afraid to say no if someone offers you alcohol. Speak up about why you do not want to drink. Set a positive example for others around you by not drinking. Build relationships with friends who do not drink. Where to find more information Substance Abuse and Mental Health Services Administration: RockToxic.pl Alcoholics Anonymous: CustomizedRugs.fi Contact a health care provider if: You cannot take your medicines as told. Your symptoms get worse or you experience symptoms of withdrawal when you stop drinking. You start drinking again  (relapse) and your symptoms get worse. Get help right away if: You have thoughts about hurting yourself or others. Get help right away if you feel like you may hurt yourself or others, or have thoughts about taking your own life. Go to your nearest emergency room or: Call 911. Call the National Suicide Prevention Lifeline at (609) 559-3670 or 988. This is open 24 hours a day. Text the Crisis Text Line at 234 593 4048. Summary Alcohol use disorder is a condition in which drinking disrupts daily life. People with this condition drink too much alcohol and cannot control their drinking. Treatment may include detoxification, counseling, medicines, and support groups. Ask friends and family members to support you. Avoid situations where alcohol is served. Get help right away if you have thoughts about hurting yourself or others. This information is not intended to replace advice given to you by your health care provider. Make sure you discuss any questions you have with your health care provider. Document Revised:  10/18/2021 Document Reviewed: 10/18/2021 Elsevier Patient Education  2024 [employer].  Following is a copy of your plan of care:  There are no care plans that you recently modified to display for this patient.

## 2024-04-24 ENCOUNTER — Ambulatory Visit: Admitting: Physician Assistant

## 2024-04-24 ENCOUNTER — Other Ambulatory Visit (INDEPENDENT_AMBULATORY_CARE_PROVIDER_SITE_OTHER): Payer: Self-pay

## 2024-04-24 DIAGNOSIS — Z6827 Body mass index (BMI) 27.0-27.9, adult: Secondary | ICD-10-CM | POA: Diagnosis not present

## 2024-04-24 DIAGNOSIS — M5412 Radiculopathy, cervical region: Secondary | ICD-10-CM | POA: Diagnosis not present

## 2024-04-24 DIAGNOSIS — M25531 Pain in right wrist: Secondary | ICD-10-CM | POA: Diagnosis not present

## 2024-04-24 NOTE — Progress Notes (Signed)
 Office Visit Note   Patient: Gerald Olson           Date of Birth: Jan 09, 1966           MRN: [id number] Visit Date: 04/24/2024              Requested by: [name], NP [address] [place],  [place] [address] PCP: [name], NP   Assessment & Plan: Visit Diagnoses:  1. Pain in right wrist     Plan: Impression is right wrist drop.  Based on the information the patient provided me and his clinical exam, I feel his symptoms are likely referred from his cervical spine.  I do not feel he has a cubital tunnel syndrome as this does not cause a wrist drop or paresthesias to the thumb and index finger.  My suspicion for CVA is low as this has been ongoing since January and he has no other right upper or lower extremity weakness.  He is already being set up for a nerve conduction study/EMG by Dr. Colon.  He plans to follow-up with Dr. Colon once this has been completed.  He will follow-up with us  as needed.  Call with concerns or questions.  Follow-Up Instructions: No follow-ups on file.   Orders:  Orders Placed This Encounter  Procedures   XR Wrist Complete Right   No orders of the defined types were placed in this encounter.     Procedures: No procedures performed   Clinical Data: No additional findings.   Subjective: Chief Complaint  Patient presents with   Right Wrist - Pain    HPI patient is a pleasant 58 year old gentleman who comes in today with concerns about his right wrist.  He has been experiencing gradually worsening weakness and paresthesias primarily to the right hand since January of this past year.  He has minimal pain associated with this.  His main issue is not being able to extend his wrist or abduct or flex his fingers secondary to weakness and numbness.  He tells me he has been seen by his PCP and a stroke has been ruled out.  He is status post ACDF C5-T1 this past June by Dr. Colon.  He was seen by Dr. Colon prior to seeing me today where it  was felt his symptoms may be coming from the cervical spine.  Nerve conduction/EMG has been ordered.  Patient is currently wearing a Velcro splint.  Review of Systems as detailed in HPI.  All others reviewed and are negative.   Objective: Vital Signs: There were no vitals taken for this visit.  Physical Exam well-developed well-nourished gentleman in no acute distress.  Alert and oriented x 3.  Ortho Exam right wrist exam: Very minimal active wrist extension.  He is able to somewhat flex his wrist, however.  He is unable to abduct his index, long, ring and small fingers.  He is unable to make a full composite fist.  He has decreased sensation to the entire hand.  He is able to range his elbow and his shoulder.  Negative Tinel at the elbow.  Specialty Comments:  No specialty comments available.  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, with long-term current use of insulin  (HCC) 11/20/2023   Right arm weakness 11/20/2023   Establishing care with new doctor, encounter for 11/20/2023   Encounter for screening involving social determinants of health (SDoH) 11/20/2023   Other spondylosis with radiculopathy, lumbar region 10/07/2023   Onychomycosis 09/21/2022   Other fatigue 09/20/2021   Spondylolisthesis at L5-S1 level 01/24/2021   Cervical radicular pain 10/26/2020   Lumbar facet arthropathy 10/26/2020   Lumbar degenerative disc disease 10/26/2020   Fall 08/09/2020   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 08/09/2020    Mesenteric lymphadenopathy 06/14/2020   Splenomegaly- mild  06/14/2020   History of cannabis abuse 06/14/2020   Recurrent major depressive disorder, in partial remission (HCC) 06/09/2020   Anxiety 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 06/09/2020   Alcohol use disorder, moderate, dependence (HCC) 06/09/2020   Foraminal stenosis of cervical region 05/09/2020   Diabetic polyneuropathy    ADHD (attention deficit hyperactivity disorder)    Generalized anxiety disorder    Major depressive disorder    Cervical spondylosis 09/22/2019   Hypertension associated with diabetes (HCC) 01/28/2019   Alcoholic cirrhosis of liver 06/02/2018   Type 2 diabetes mellitus with hyperglycemia 04/25/2018   Past Medical History:  Diagnosis Date   ADHD (attention deficit hyperactivity disorder)    Diagnosed during teenage years   Alcohol use disorder    On average 14-21 drinks per week   Alcoholic cirrhosis of liver    Per patient - condition is not related to moderate alcohol use but rather prolonged NSAID use and recent abuse.   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[place]   Major depressive disorder    Other spondylosis with radiculopathy, cervical region    Retinopathy    Right eye; per patient report   Type 2 diabetes mellitus with hyperglycemia     Family History  Problem Relation Age of Onset   Aneurysm Father    Drug abuse Sister    Multiple sclerosis Sister    Suicidality Maternal Aunt    Emphysema Maternal Grandfather    Diabetes Neg Hx     Past Surgical History:  Procedure  Laterality Date   ANTERIOR CERVICAL DECOMPRESSION/DISCECTOMY FUSION 4 LEVELS N/A 02/13/2024   Procedure: ANTERIOR CERVICAL DECOMPRESSION/DISCECTOMY FUSION, CERVICAL FOUR - CERVICAL FIVE, CERVICAL FIVE - CERVICAL SIX, CERVICAL SIX - CERVICAL SEVEN, CERVICAL SEVEN - THORACIC ONE;  Surgeon: Colon Shove, MD;  Location: [facility] OR;  Service: Neurosurgery;  Laterality: N/A;   CHOLECYSTECTOMY N/A 06/02/2018   Procedure: LAPAROSCOPIC CHOLECYSTECTOMY;  Surgeon: Nicholaus Selinda Birmingham, MD;  Location: [facility] ORS;  Service: General;  Laterality: N/A;   KNEE ARTHROSCOPY Right    30 Years ago   lumbar five-sacral one posterior lumbar interbody fusion  01/24/2021   LUMBAR LAMINECTOMY/DECOMPRESSION MICRODISCECTOMY Bilateral 10/07/2023   Procedure: Laminectomy for Facet/Synovial Cyst - Lumbar Four-Lumbar Five - Bilateral;  Surgeon: Colon Shove, MD;  Location: [facility] OR;  Service: Neurosurgery;  Laterality: Bilateral;  C3   Social History   Occupational History   Not on file  Tobacco Use   Smoking status: Former    Current packs/day: 0.00    Types: Cigarettes    Quit date: 06/25/2003    Years since quitting: 20.8   Smokeless tobacco: Never   Tobacco comments:    Currently vapes CBD oil  Vaping Use   Vaping status: Every Day   Substances: CBD  Substance and Sexual Activity   Alcohol use: Not Currently    Alcohol/week: 42.0 - 56.0 standard drinks of alcohol    Types: 42 - 56 Cans of beer per week    Comment: 6-8 beers per day   Drug use: Yes    Types: Marijuana    Comment: twice/week to help sleep; edibles, does not smoke   Sexual activity: Yes

## 2024-04-30 ENCOUNTER — Other Ambulatory Visit: Payer: Self-pay

## 2024-04-30 NOTE — Progress Notes (Signed)
 Specialty Pharmacy Ongoing Clinical Assessment Note  Gerald Olson is a 58 y.o. male who is being followed by the specialty pharmacy service for RxSp Oncology   Patient's specialty medication(s) reviewed today: Imatinib  Mesylate (GLEEVEC )   Missed doses in the last 4 weeks: 1   Patient/Caregiver did not have any additional questions or concerns.   Therapeutic benefit summary: Unable to assess   Adverse events/side effects summary: Experienced adverse events/side effects (Pt has no appetite, tries to eat when he can)   Patient's therapy is appropriate to: Continue    Goals Addressed             This Visit's Progress    Achieve or maintain remission       Patient is unable to be assessed as therapy was recently initiated. Patient will maintain adherence         Follow up: 3 months  [facility] Specialty Pharmacist

## 2024-04-30 NOTE — Progress Notes (Signed)
 Specialty Pharmacy Refill Coordination Note  Gerald Olson is a 58 y.o. male contacted today regarding refills of specialty medication(s) Imatinib  Mesylate (GLEEVEC )   Patient requested Delivery   Delivery date: 05/07/24   Verified address: [address] [facility] [place] [address]   Medication will be filled on 05/06/24.

## 2024-05-01 ENCOUNTER — Other Ambulatory Visit: Payer: Self-pay | Admitting: *Deleted

## 2024-05-01 NOTE — Patient Outreach (Signed)
 Complex Care Management   Visit Note  05/01/2024  Name:  Gerald Olson MRN: [id number] DOB: [birth year]  Situation: Referral received for Complex Care Management related to Mental/Behavioral Health diagnosis Anxiety and depression related to his medical condition I obtained verbal consent from Patient.  Visit completed with Patient  on the phone Needs assessment completed, challenges adjusting to his medical condition discussed. Patient was able to verbalize appropriate coping mechanisms to address his challenges and declined ongoing mental health follow up at this time.  Background:   Past Medical History:  Diagnosis Date   ADHD (attention deficit hyperactivity disorder)    Diagnosed during teenage years   Alcohol use disorder    On average 14-21 drinks per week   Alcoholic cirrhosis of liver    Per patient - condition is not related to moderate alcohol use but rather prolonged NSAID use and recent abuse.   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[place]   Major depressive disorder    Other spondylosis with radiculopathy, cervical region    Retinopathy    Right eye; per patient report   Type 2 diabetes mellitus with hyperglycemia     Assessment: Patient Reported Symptoms:  Cognitive Cognitive Status: Able to follow simple commands, Alert and oriented to person, place, and time, Difficulties with attention and concentration (reports having ADHD) Cognitive/Intellectual Conditions Management [RPT]: None reported or documented in medical history or problem list   Health  Maintenance Behaviors: Annual physical exam Healing Pattern: Average Health Facilitated by: Stress management  Neurological Neurological Review of Symptoms: Numbness, Weakness Oher Neurological Symptoms/Conditions [RPT]: weakness, had neck surgery that affected his vocal cord-cannot eat anything with texture  or crunchy-right hand paralysis, left hand numbness cannot use fingers-nerve test scheduled for 06/24/24 Neurological Management Strategies: Medication therapy, Routine screening Neurological Self-Management Outcome: 3 (uncertain) Neurological Comment: Neurosurgery appointment pending the results of the nerve test on 06/24/24  HEENT HEENT Symptoms Reported: No symptoms reported HEENT Management Strategies: Routine screening HEENT Self-Management Outcome: 4 (good) Vision problem(s)  Cardiovascular Cardiovascular Symptoms Reported: No symptoms reported    Respiratory Respiratory Symptoms Reported: Dry cough Respiratory Management Strategies: Routine screening  Endocrine Endocrine Symptoms Reported: No symptoms reported Is patient diabetic?: Yes Is patient checking blood sugars at home?: No List most recent blood sugar readings, include date and time of day: 2 days ago it was 126 Endocrine Self-Management Outcome: 4 (good) Endocrine Comment: Endocrinologist 06/04/24  Gastrointestinal Gastrointestinal Symptoms Reported: No symptoms reported      Genitourinary Genitourinary Symptoms Reported: No symptoms reported    Integumentary Integumentary Symptoms Reported: No symptoms reported    Musculoskeletal Musculoskelatal Symptoms Reviewed: Back pain, Difficulty walking, Joint pain, Unsteady gait, Weakness Additional Musculoskeletal Details: neck pain, shoulder pain, right hand paralysis, left arm numb Musculoskeletal Management Strategies: Routine screening, Medication therapy      Psychosocial Psychosocial Symptoms Reported: Depression - if selected complete PHQ 2-9 Additional  Psychological Details: physical condition adds to the depression-saw therapist for a while-was not doing anything for me-declines ongoing  mental health follow up at this time Behavioral Management Strategies: Activity, Coping strategies Behavioral Health Comment: alcohol use at least 6 beers per day, ususally in the evening, per patient does not affect his functioning Major Change/Loss/Stressor/Fears (CP): Medical condition, self Behaviors When Feeling Stressed/Fearful: Going back to CBT every day I do something 2-3 per day to keep ee busy of things I can do Techniques to Cope with Loss/Stress/Change: Diversional activities Quality of Family Relationships: supportive Do you feel physically threatened by others?: No    05/01/2024    PHQ2-9 Depression Screening   Little interest or pleasure in doing things Nearly every day (usually due to physical condition, not able to do things)  Feeling down, depressed, or hopeless Nearly every day  PHQ-2 - Total Score 6  Trouble falling or staying asleep, or sleeping too much Nearly every day  Feeling tired or having little energy Nearly every day  Poor appetite or overeating  Not at all  Feeling bad about yourself - or that you are a failure or have let yourself or your family down Nearly every day  Trouble concentrating on things, such as reading the newspaper or watching television Nearly every day  Moving or speaking so slowly that other people could have noticed.  Or the opposite - being so fidgety or restless that you have been moving around a lot more than usual Not at all  Thoughts that you would be better off dead, or hurting yourself in some way Not at all (reports having death apathy but has not plan to harm self or others)  PHQ2-9 Total Score 18  If you checked off any problems, how difficult have these problems made it for you to do your work, take care of things at home, or get along with other people Very difficult  Depression  Interventions/Treatment      There were no vitals filed for this visit.  Medications Reviewed Today     Reviewed by Ermalinda Lenn HERO, LCSW (Social Worker) on 05/01/24 at 1452  Med List Status: <None>   Medication Order Taking? Sig Documenting Provider Last Dose Status Informant  ALPHA LIPOIC ACID PO [id number] Yes Take 4 capsules by mouth daily with lunch. [provider]  Active Self  aspirin 325 MG tablet [id number] Yes Take 1,300 mg by mouth every 8 (eight) hours as needed.  Patient taking differently: Take 1,300 mg by mouth every 8 (eight) hours as needed. States rotates with naproxen and ibuprofen    [provider]  Active Self  BLACK CURRANT SEED OIL PO [id number] Yes Take 1 capsule by mouth daily with lunch. [provider]  Active Self  Cholecalciferol (VITAMIN D -3 PO) [id number] Yes Take 5,000 Units by mouth daily with lunch.  Patient taking differently: Take 5,000 Units by mouth daily with lunch. Taking 3 tabs daily - advised to take as directed   [provider]  Active Self  cyclobenzaprine  (FLEXERIL ) 5 MG tablet [id number]  Take 1 tablet (5 mg total) by mouth 2 (two) times daily as needed for muscle spasms. [name], NP  Active Self  dapagliflozin  propanediol (FARXIGA ) 5 MG TABS tablet [id number]  Take 1 tablet (5 mg total) by mouth daily before breakfast. Trixie File, MD  Active Self  diclofenac  (VOLTAREN ) 50 MG EC tablet [id number]  Take 50 mg by mouth daily as needed (pain).  Patient not taking: Reported on 05/01/2024   [provider]  Active Self  diclofenac  Sodium (VOLTAREN  ARTHRITIS PAIN) 1 % GEL [id number]  Yes Apply 2 g topically 4 (four) times daily. [name], NP  Active   glucose blood (ACCU-CHEK GUIDE) test strip [id number] Yes USE AS DIRECTED 2 TIMES DAILY Trixie File, MD  Active Self  ibuprofen  (ADVIL ) 200 MG tablet [id number] Yes Take 800-1,200 mg by mouth 2 (two) times daily as needed (pain.). [provider]  Active Self  imatinib  (GLEEVEC ) 400 MG tablet [id number] Yes Take 1 tablet (400 mg total) by mouth daily. Take with meals and large glass of water.Caution:Chemotherapy. Jacobo Evalene PARAS, MD  Active   insulin  glargine (LANTUS  SOLOSTAR) 100 UNIT/ML Solostar Pen [id number] Yes Inject 7 Units into the skin every morning. Trixie File, MD  Active Self  Insulin  Pen Needle (TECHLITE PEN NEEDLES) 32G X 4 MM MISC [id number] Yes Use to inject Victoza  once a day Trixie File, MD  Active Self  MAGNESIUM COMPLEX HIGH POTENCY PO [id number] Yes Take 1 capsule by mouth in the morning. [provider]  Active Self  methocarbamol  (ROBAXIN ) 500 MG tablet [id number] Yes Take 1 tablet (500 mg total) by mouth every 6 (six) hours as needed for muscle spasms. [name], NP  Active   Multiple Vitamins-Minerals (ALIVE MENS GUMMY MULTIVITAMINS PO) [id number] Yes Take 4 each by mouth in the morning.  Patient taking differently: Take 4 each by mouth in the morning. Using centrum   [provider]  Active Self  naproxen sodium (ALEVE) 220 MG tablet [id number] Yes Take 660 mg by mouth 2 (two) times daily as needed (pain.). [provider]  Active Self  OVER THE COUNTER MEDICATION [id number]  Take 5 mLs by mouth every 3 (three) hours as needed (pain). Wild Lettuce Tincture  Patient not taking: Reported on 05/01/2024   [provider]  Active Self  pregabalin  (LYRICA ) 100 MG capsule [id number] Yes Take 1 capsule (100 mg total) by mouth 2 (two) times daily.  Patient taking differently: Take 100 mg by mouth at bedtime as needed (pain.).   Urbano Albright, MD  Active Self  TURMERIC-GINGER PO [id number] Yes Take 3 capsules by mouth in the morning. With Garlic  Patient taking differently: Take 3 capsules by mouth in the morning. With Garlic, curcumin and vit B   [provider]  Active Self  zinc sulfate, 50mg  elemental zinc, 220 (50 Zn) MG capsule [id number] Yes Take  220 mg by mouth daily after lunch. [provider]  Active Self  Med List Note Teretha Renaee SAILOR, RPH-CPP 07/28/20 1202): Gleevec  (imatinib ) filled at [facility]            Recommendation:   PCP Follow-up Specialty provider follow-up as scheduled Ongoing mental health follow up recommended but was declined  Follow Up Plan:   Closing From:  Social Work   [employer], [employer] [place]  [employer], [employer] [facility] Licensed Clinical Social Worker  Direct Dial : [phone]

## 2024-05-02 NOTE — Patient Instructions (Signed)
 Visit Information  Thank you for taking time to visit with me today. Please don't hesitate to contact me if I can be of assistance to you in the future  no further scheduled appointments.    Please call the Suicide and Crisis Lifeline: 988 call the [place]  National Suicide Prevention Lifeline: (613)460-0392 or TTY: (225)226-1262 TTY 317 848 5232) to talk to a trained counselor call 1-800-273-TALK (toll free, 24 hour hotline) if you are experiencing a Mental Health or Behavioral Health Crisis or need someone to talk to.  Patient verbalizes understanding of instructions and care plan provided today and agrees to view in MyChart. Active MyChart status and patient understanding of how to access instructions and care plan via MyChart confirmed with patient.     Kashon Kraynak, LCSW   [facility], [facility] Licensed Clinical Social Worker  Direct Dial : [phone]

## 2024-05-06 ENCOUNTER — Other Ambulatory Visit: Payer: Self-pay

## 2024-05-12 DIAGNOSIS — R0683 Snoring: Secondary | ICD-10-CM | POA: Diagnosis not present

## 2024-05-12 DIAGNOSIS — J3801 Paralysis of vocal cords and larynx, unilateral: Secondary | ICD-10-CM | POA: Diagnosis not present

## 2024-05-12 DIAGNOSIS — R1310 Dysphagia, unspecified: Secondary | ICD-10-CM | POA: Diagnosis not present

## 2024-05-12 DIAGNOSIS — F1090 Alcohol use, unspecified, uncomplicated: Secondary | ICD-10-CM | POA: Diagnosis not present

## 2024-05-12 DIAGNOSIS — R490 Dysphonia: Secondary | ICD-10-CM | POA: Diagnosis not present

## 2024-05-12 DIAGNOSIS — G894 Chronic pain syndrome: Secondary | ICD-10-CM | POA: Diagnosis not present

## 2024-05-13 ENCOUNTER — Other Ambulatory Visit: Payer: Self-pay

## 2024-05-13 DIAGNOSIS — J3801 Paralysis of vocal cords and larynx, unilateral: Secondary | ICD-10-CM | POA: Insufficient documentation

## 2024-05-13 DIAGNOSIS — F1090 Alcohol use, unspecified, uncomplicated: Secondary | ICD-10-CM | POA: Insufficient documentation

## 2024-05-13 DIAGNOSIS — G894 Chronic pain syndrome: Secondary | ICD-10-CM | POA: Insufficient documentation

## 2024-05-13 NOTE — Patient Outreach (Signed)
 Complex Care Management   Visit Note  05/13/2024  Name:  Gerald Olson MRN: [id number] DOB: 1966/04/17  Situation: Referral received for Complex Care Management related to Diabetes with Complications and Chronic Pain I obtained verbal consent from Patient.  Visit completed with Patient  on the phone  Background:   Past Medical History:  Diagnosis Date   ADHD (attention deficit hyperactivity disorder)    Diagnosed during teenage years   Alcohol use disorder    On average 14-21 drinks per week   Alcoholic cirrhosis of liver    Per patient - condition is not related to moderate alcohol use but rather prolonged NSAID use and recent abuse.   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[place]   Major depressive disorder    Other spondylosis with radiculopathy, cervical region    Retinopathy    Right eye; per patient report   Type 2 diabetes mellitus with hyperglycemia     Assessment: Patient Reported Symptoms:  Cognitive Cognitive Status: Difficulties with attention and concentration, Alert and oriented to person, place, and time, Normal speech and language skills Cognitive/Intellectual Conditions Management [RPT]: None reported or documented in medical history or problem list   Health Maintenance Behaviors: Annual physical exam  Neurological Neurological Review of Symptoms: Weakness, Numbness Oher Neurological Symptoms/Conditions [RPT]: L vocal cord paralysis -- voice improving, left hand numbness Neurological Management Strategies: Medication therapy, Routine screening  HEENT  HEENT Symptoms Reported: Not assessed      Cardiovascular Cardiovascular Symptoms Reported: Palpitations (reports palpitations when very anxious only) Does patient have uncontrolled Hypertension?: No Cardiovascular Management Strategies: Routine screening  Respiratory Other Respiratory Symptoms: states snoring - waking self up seasonal allergies pollen/mold irritating, swallowing difficult at times due to paralyzed voca cord, DOE - walking dog more Respiratory Management Strategies: Routine screening  Endocrine Endocrine Symptoms Reported: No symptoms reported Is patient diabetic?: Yes Is patient checking blood sugars at home?: No List most recent blood sugar readings, include date and time of day: when he does check, usually 120-130, denies lows    Gastrointestinal Additional Gastrointestinal Details: swallowing issues  - eating without choking Gastrointestinal Management Strategies: Medication therapy    Genitourinary Genitourinary Symptoms Reported: Other (unchanged)    Integumentary Integumentary Symptoms Reported: No symptoms reported    Musculoskeletal Musculoskelatal Symptoms Reviewed: Back pain, Difficulty walking, Joint pain, Unsteady gait, Weakness Additional Musculoskeletal Details: will call ortho to eval worsening pain in right shoulder and hip - focused on hand last appointment Musculoskeletal Management Strategies: Medication therapy Falls in the past year?: Yes Number of falls in past year: 2 or more Was there an injury with Fall?: No Fall Risk Category Calculator: 2 Patient Fall Risk Level: Moderate Fall Risk Patient at Risk for Falls Due to: Impaired balance/gait, History of fall(s), Orthopedic patient Fall risk Follow up: Falls evaluation completed, Falls prevention discussed  Psychosocial Psychosocial Symptoms Reported: Depression - if selected complete PHQ 2-9 Additional Psychological Details: met with and declined to continue with LCSW Behavioral Management  Strategies: Activity, Coping  strategies Behavioral Health Comment: 6 beers/day, cannibis prn for sleep        05/13/2024    PHQ2-9 Depression Screening   Little interest or pleasure in doing things Nearly every day  Feeling down, depressed, or hopeless Nearly every day  PHQ-2 - Total Score 6  Trouble falling or staying asleep, or sleeping too much Nearly every day  Feeling tired or having little energy Nearly every day  Poor appetite or overeating  Not at all  Feeling bad about yourself - or that you are a failure or have let yourself or your family down Nearly every day  Trouble concentrating on things, such as reading the newspaper or watching television Nearly every day  Moving or speaking so slowly that other people could have noticed.  Or the opposite - being so fidgety or restless that you have been moving around a lot more than usual Not at all  Thoughts that you would be better off dead, or hurting yourself in some way Not at all  PHQ2-9 Total Score 18  If you checked off any problems, how difficult have these problems made it for you to do your work, take care of things at home, or get along with other people Extremely dIfficult  Depression Interventions/Treatment Patient refuses Treatment (Reports having psych degrees and treating self)    There were no vitals filed for this visit.  Medications Reviewed Today     Reviewed by Devra Lands, RN (Registered Nurse) on 05/13/24 at 1413  Med List Status: <None>   Medication Order Taking? Sig Documenting Provider Last Dose Status Informant  ALPHA LIPOIC ACID PO [id number] Yes Take 4 capsules by mouth daily with lunch. [provider]  Active Self  aspirin 325 MG tablet [id number] Yes Take 1,300 mg by mouth every 8 (eight) hours as needed.  Patient taking differently: Take 1,300 mg by mouth every 8 (eight) hours as needed. States rotates with naproxen and ibuprofen    [provider]  Active Self  BLACK CURRANT  SEED OIL PO [id number] Yes Take 1 capsule by mouth daily with lunch. [provider]  Active Self  Cholecalciferol (VITAMIN D -3 PO) [id number] Yes Take 5,000 Units by mouth daily with lunch. [provider]  Active Self  cyclobenzaprine  (FLEXERIL ) 5 MG tablet [id number] Yes Take 1 tablet (5 mg total) by mouth 2 (two) times daily as needed for muscle spasms. [name], NP  Active Self  dapagliflozin  propanediol (FARXIGA ) 5 MG TABS tablet [id number] Yes Take 1 tablet (5 mg total) by mouth daily before breakfast. Trixie File, MD  Active Self  diclofenac  (VOLTAREN ) 50 MG EC tablet [id number]  Take 50 mg by mouth daily as needed (pain).  Patient not taking: Reported on 05/01/2024   [provider]  Active Self  diclofenac  Sodium (VOLTAREN  ARTHRITIS PAIN) 1 % GEL [id number] Yes Apply 2 g topically 4 (four) times daily. [name], NP  Active   glucose blood (ACCU-CHEK GUIDE) test strip [id number] Yes USE AS DIRECTED 2 TIMES DAILY Trixie File, MD  Active Self  ibuprofen  (ADVIL ) 200 MG tablet [id number] Yes Take 800-1,200 mg by mouth 2 (two) times daily as needed (pain.). [provider]  Active Self  imatinib  (GLEEVEC ) 400 MG tablet [id number] Yes Take 1 tablet (400 mg total) by mouth daily. Take with meals and large glass of water.Caution:Chemotherapy. Jacobo Evalene PARAS, MD  Active   insulin  glargine (LANTUS  SOLOSTAR) 100 UNIT/ML Solostar Pen [id number] Yes Inject 7 Units into the skin every  morning. Trixie File, MD  Active Self  Insulin  Pen Needle (TECHLITE PEN NEEDLES) 32G X 4 MM MISC [id number] Yes Use to inject Victoza  once a day Trixie File, MD  Active Self  MAGNESIUM COMPLEX HIGH POTENCY PO [id number] Yes Take 1 capsule by mouth in the morning. [provider]  Active Self  methocarbamol  (ROBAXIN ) 500 MG tablet [id number] Yes Take 1 tablet (500 mg total) by mouth every 6 (six) hours as needed for muscle spasms. [name], NP  Active    Multiple Vitamins-Minerals (ALIVE MENS GUMMY MULTIVITAMINS PO) [id number] Yes Take 4 each by mouth in the morning.  Patient taking differently: Take 4 each by mouth in the morning. Using centrum   [provider]  Active Self  naproxen sodium (ALEVE) 220 MG tablet [id number] Yes Take 660 mg by mouth 2 (two) times daily as needed (pain.). [provider]  Active Self  OVER THE COUNTER MEDICATION [id number]  Take 5 mLs by mouth every 3 (three) hours as needed (pain). Wild Lettuce Tincture  Patient not taking: Reported on 05/01/2024   [provider]  Active Self  pregabalin  (LYRICA ) 100 MG capsule [id number] Yes Take 1 capsule (100 mg total) by mouth 2 (two) times daily.  Patient taking differently: Take 100 mg by mouth at bedtime as needed (pain.).   Urbano Albright, MD  Active Self  TURMERIC-GINGER PO [id number]  Take 3 capsules by mouth in the morning. With Garlic  Patient not taking: Reported on 05/13/2024   [provider]  Active Self  zinc sulfate, 50mg  elemental zinc, 220 (50 Zn) MG capsule [id number] Yes Take 220 mg by mouth daily after lunch. [provider]  Active Self  Med List Note Teretha Renaee SAILOR, RPH-CPP 07/28/20 1202): Gleevec  (imatinib ) filled at [facility]            Recommendation:   PCP Follow-up Specialty provider follow-up Ortho for hip and shoulder Continue Current Plan of Care  Follow Up Plan:   Telephone follow-up in 1 month  [name], MSN, RN [facility]  [facility], [facility] RN Care Manager Direct Dial : [phone] Fax: [phone]

## 2024-05-13 NOTE — Patient Instructions (Addendum)
 Visit Information  Gerald Olson was given information about Medicaid Managed Care team care coordination services as a part of their Healthy [facility] Medicaid benefit. Gerald Olson   If you would like to schedule transportation through your Healthy [facility] plan, please call the following number at least 2 days in advance of your appointment: [phone]  For information about your ride after you set it up, call Ride Assist at [phone]. Use this number to activate a Will Call pickup, or if your transportation is late for a scheduled pickup. Use this number, too, if you need to make a change or cancel a previously scheduled reservation.  If you need transportation services right away, call [phone]. The after-hours call center is staffed 24 hours to handle ride assistance and urgent reservation requests (including discharges) 365 days a year. Urgent trips include sick visits, hospital discharge requests and life-sustaining treatment.  Call the [facility] Line at [phone], at any time, 24 hours a day, 7 days a week. If you are in danger or need immediate medical attention call 911.   Gerald Olson - following are the goals we discussed in your visit today:   Goals Addressed             This Visit's Progress    VBCI RN Care Plan- Chronic right arm/wrist pain /inability to use./ back pain       Problems:  Chronic Disease Management support and education needs related to right arm/ back pain Financial Constraints with limited social support and reluctance to engage with housing resources and therapy.  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  Continued ETOH use - approx 4 beers daily and Vapes CBD oil - discussed affect of ETOH on liver, interaction with medications, vaping and lungs/throat  Goal: Over the next 3 months the Patient will attend all scheduled medical appointments: with provider as evidenced by  patient report /chart review.         continue to work with Medical illustrator and/or Social Worker to address care management and care coordination needs related to right arm/ wrist pain as evidenced by adherence to care management team scheduled appointments     demonstrate Ongoing adherence to prescribed treatment plan for right arm/ wrist pain as evidenced by patient report/ chart review. take all medications exactly as prescribed and will call provider for medication related questions as evidenced by patient report / chart review.      Interventions:   Evaluation of current treatment plan related to right shoulder & wrist/ arm pain, also left arm numbness at times,  self-management and patient's adherence to plan as established by provider. Patient continues to report 6 beers daily, Vapes CBD at night for sleep. Added herbal supplements.  Discussed plans with patient for ongoing care management follow up and provided patient with direct contact information for care management team Advised to keep follow up visit with neurologist - called Neurosurgery with patient on line - received call back to notify of change in symptoms. Called PCP with patient on line and made appointment for tomorrow regarding referral to ortho regarding Right wrist. Patient states has transportation to appointment.  Advised to notify provider for worsening symptoms.  Advised to take medications as prescribed . Called pharmacy with patient on line and assisted to renew medications. Verified medications delivered   Referred to social worker due to unsafe housing conditions: mold, leaking roof, collapsed basement. Patient declines  to utilize the resources provided at this time, receiving assistance with repairs from neighbor/friend. Patient reports improvement in living conditions re ceiling - help from neighbor Discussed reducing ETOH due to impact on liver and interactions with meds,  reduce vaping due to impact on lungs and  throat - patient declines Encouraged to get assist from friends/mother regarding place to live while house being repaired.  Aware LCSW appointment can be rescheduled when ready to talk. Agreed to LCSW appointment and discussed being open to information, patient with psych degree and frequently shares how he tries to treat himself with analysis and CBT. Appointment 05/02/23 at 2:00 pm. Met with SW and declined further follow up.   Patient Self-Care Activities:  Attend all scheduled provider appointments Call pharmacy for medication refills 3-7 days in advance of running out of medications Call provider office for new concerns or questions  Take medications as prescribed   Keep follow up visit with neurologist and report worsening symptoms.  Keep appointment with LCSW Consider reducing ETOH and marijuana use   Plan:  Telephone follow up appointment with care management team member scheduled for:  06/10/2024 at 2:00 pm             Please see education materials related to depression provided by MyChart link.  Patient verbalizes understanding of instructions and care plan provided today and agrees to view in MyChart. Active MyChart status and patient understanding of how to access instructions and care plan via MyChart confirmed with patient.     Telephone follow up appointment with Managed Medicaid care management team member scheduled for: 06/10/2024 at 2:00 pm  Gerald Duos, MSN, RN [place]  [facility], [facility] RN Care Manager Direct Dial : [phone] Fax: [phone]   Following is a copy of your plan of care:  There are no care plans that you recently modified to display for this patient.       Managing Depression, Adult Depression is a mental health condition that affects your thoughts, feelings, and actions. Being diagnosed with depression can bring you relief if you did not know why you have felt or behaved a certain way. It could  also leave you feeling overwhelmed. Finding ways to manage your symptoms can help you feel more positive about your future. How to manage lifestyle changes Being depressed is difficult. Depression can increase the level of everyday stress. Stress can make depression symptoms worse. You may believe your symptoms cannot be managed or will never improve. However, there are many things you can try to help manage your symptoms. There is hope. Managing stress  Stress is your body's reaction to life changes and events, both good and bad. Stress can add to your feelings of depression. Learning to manage your stress can help lessen your feelings of depression. Try some of the following approaches to reducing your stress (stress reduction techniques): Listen to music that you enjoy and that inspires you. Try using a meditation app or take a meditation class. Develop a practice that helps you connect with your spiritual self. Walk in nature, pray, or go to a place of worship. Practice deep breathing. To do this, inhale slowly through your nose. Pause at the top of your inhale for a few seconds and then exhale slowly, letting yourself relax. Repeat this three or four times. Practice yoga to help relax and work your muscles. Choose a stress reduction technique that works for you. These techniques take time and practice to develop. Set aside 5-15 minutes  a day to do them. Therapists can offer training in these techniques. Do these things to help manage stress: Keep a journal. Know your limits. Set healthy boundaries for yourself and others, such as saying no when you think something is too much. Pay attention to how you react to certain situations. You may not be able to control everything, but you can change your reaction. Add humor to your life by watching funny movies or shows. Make time for activities that you enjoy and that relax you. Spend less time using electronics, especially at night before bed. The  light from screens can make your brain think it is time to get up rather than go to bed.  Medicines Medicines, such as antidepressants, are often a part of treatment for depression. Talk with your pharmacist or health care provider about all the medicines, supplements, and herbal products that you take, their possible side effects, and what medicines and other products are safe to take together. Make sure to report any side effects you may have to your health care provider. Relationships Your health care provider may suggest family therapy, couples therapy, or individual therapy as part of your treatment. How to recognize changes Everyone responds differently to treatment for depression. As you recover from depression, you may start to: Have more interest in doing activities. Feel more hopeful. Have more energy. Eat a more regular amount of food. Have better mental focus. It is important to recognize if your depression is not getting better or is getting worse. The symptoms you had in the beginning may return, such as: Feeling tired. Eating too much or too little. Sleeping too much or too little. Feeling restless, agitated, or hopeless. Trouble focusing or making decisions. Having unexplained aches and pains. Feeling irritable, angry, or aggressive. If you or your family members notice these symptoms coming back, let your health care provider know right away. Follow these instructions at home: Activity Try to get some form of exercise each day, such as walking. Try yoga, mindfulness, or other stress reduction techniques. Participate in group activities if you are able. Lifestyle Get enough sleep. Cut down on or stop using caffeine, tobacco, alcohol, and any other harmful substances. Eat a healthy diet that includes plenty of vegetables, fruits, whole grains, low-fat dairy products, and lean protein. Limit foods that are high in solid fats, added sugar, or salt (sodium). General  instructions Take over-the-counter and prescription medicines only as told by your health care provider. Keep all follow-up visits. It is important for your health care provider to check on your mood, behavior, and medicines. Your health care provider may need to make changes to your treatment. Where to find support Talking to others  Friends and family members can be sources of support and guidance. Talk to trusted friends or family members about your condition. Explain your symptoms and let them know that you are working with a health care provider to treat your depression. Tell friends and family how they can help. Finances Find mental health providers that fit with your financial situation. Talk with your health care provider if you are worried about access to food, housing, or medicine. Call your insurance company to learn about your co-pays and prescription plan. Where to find more information You can find support in your area from: Anxiety and Depression Association of America (ADAA): adaa.org Mental Health America: mentalhealthamerica.net The [employer] on Mental Illness: nami.org Contact a health care provider if: You stop taking your antidepressant medicines, and you have any of  these symptoms: Nausea. Headache. Light-headedness. Chills and body aches. Not being able to sleep (insomnia). You or your friends and family think your depression is getting worse. Get help right away if: You have thoughts of hurting yourself or others. Get help right away if you feel like you may hurt yourself or others, or have thoughts about taking your own life. Go to your nearest emergency room or: Call 911. Call the National Suicide Prevention Lifeline at 573-472-9739 or 988. This is open 24 hours a day. Text the Crisis Text Line at 2311402322. This information is not intended to replace advice given to you by your health care provider. Make sure you discuss any questions you have with your  health care provider. Document Revised: 12/19/2021 Document Reviewed: 12/19/2021 Elsevier Patient Education  2024 [employer].

## 2024-05-29 DIAGNOSIS — M5416 Radiculopathy, lumbar region: Secondary | ICD-10-CM | POA: Diagnosis not present

## 2024-05-29 DIAGNOSIS — Z9889 Other specified postprocedural states: Secondary | ICD-10-CM | POA: Diagnosis not present

## 2024-05-29 DIAGNOSIS — G894 Chronic pain syndrome: Secondary | ICD-10-CM | POA: Diagnosis not present

## 2024-05-29 DIAGNOSIS — M7918 Myalgia, other site: Secondary | ICD-10-CM | POA: Diagnosis not present

## 2024-06-01 ENCOUNTER — Other Ambulatory Visit: Payer: Self-pay

## 2024-06-01 NOTE — Progress Notes (Signed)
 Specialty Pharmacy Refill Coordination Note  Gerald Olson is a 58 y.o. male contacted today regarding refills of specialty medication(s) Imatinib  Mesylate (GLEEVEC )   Patient requested Delivery   Delivery date: 06/12/24   Verified address: [address] [facility] [place] [address]   Medication will be filled on 06/11/24.

## 2024-06-03 ENCOUNTER — Other Ambulatory Visit: Payer: Self-pay

## 2024-06-03 ENCOUNTER — Other Ambulatory Visit (HOSPITAL_COMMUNITY): Payer: Self-pay

## 2024-06-03 MED ORDER — METHOCARBAMOL 500 MG PO TABS
500.0000 mg | ORAL_TABLET | Freq: Three times a day (TID) | ORAL | 0 refills | Status: DC | PRN
  Filled 2024-06-03: qty 90, 30d supply, fill #0

## 2024-06-03 MED ORDER — CYCLOBENZAPRINE HCL 5 MG PO TABS
5.0000 mg | ORAL_TABLET | Freq: Two times a day (BID) | ORAL | 1 refills | Status: DC | PRN
  Filled 2024-06-03: qty 30, 15d supply, fill #0

## 2024-06-03 MED ORDER — CELECOXIB 200 MG PO CAPS
200.0000 mg | ORAL_CAPSULE | Freq: Two times a day (BID) | ORAL | 0 refills | Status: AC
  Filled 2024-06-03: qty 60, 30d supply, fill #0

## 2024-06-04 ENCOUNTER — Other Ambulatory Visit: Payer: Self-pay

## 2024-06-04 ENCOUNTER — Ambulatory Visit: Admitting: Internal Medicine

## 2024-06-04 ENCOUNTER — Encounter: Payer: Self-pay | Admitting: Internal Medicine

## 2024-06-04 ENCOUNTER — Other Ambulatory Visit (HOSPITAL_COMMUNITY): Payer: Self-pay

## 2024-06-04 VITALS — BP 140/70 | HR 94 | Ht 74.0 in | Wt 218.0 lb

## 2024-06-04 DIAGNOSIS — Z7984 Long term (current) use of oral hypoglycemic drugs: Secondary | ICD-10-CM | POA: Diagnosis not present

## 2024-06-04 DIAGNOSIS — E785 Hyperlipidemia, unspecified: Secondary | ICD-10-CM | POA: Diagnosis not present

## 2024-06-04 DIAGNOSIS — Z794 Long term (current) use of insulin: Secondary | ICD-10-CM | POA: Diagnosis not present

## 2024-06-04 DIAGNOSIS — E1165 Type 2 diabetes mellitus with hyperglycemia: Secondary | ICD-10-CM | POA: Diagnosis not present

## 2024-06-04 LAB — POCT GLYCOSYLATED HEMOGLOBIN (HGB A1C): Hemoglobin A1C: 5.7 % — AB (ref 4.0–5.6)

## 2024-06-04 MED ORDER — LANTUS SOLOSTAR 100 UNIT/ML ~~LOC~~ SOPN
8.0000 [IU] | PEN_INJECTOR | SUBCUTANEOUS | 3 refills | Status: AC
  Filled 2024-06-04: qty 9, 84d supply, fill #0

## 2024-06-04 MED ORDER — DAPAGLIFLOZIN PROPANEDIOL 5 MG PO TABS
5.0000 mg | ORAL_TABLET | Freq: Every day | ORAL | 3 refills | Status: AC
  Filled 2024-06-04 – 2024-09-18 (×5): qty 90, 90d supply, fill #0

## 2024-06-04 NOTE — Patient Instructions (Addendum)
 Please continue: - Farxiga  5 mg daily in am - Lantus  8 units daily in am  Please return in 6 months with your sugar log (before 12/02/2024).

## 2024-06-04 NOTE — Progress Notes (Signed)
 Patient ID: Gerald Olson, male   DOB: 1966-01-10, 58 y.o.   MRN: [id number]  HPI: [name] is a 58 y.o.-year-old male, returning for follow-up for DM2 dx in 2019, with prediabetes 2006, insulin -dependent since 2020, uncontrolled, with complications (peripheral artery disease, diabetic retinopathy, peripheral neuropathy). Pt. previously saw Dr. Kassie, but last visit with me 6 months ago.  Interim history: No increased urination, blurry vision, nausea, chest pain. He has back pain-seen in the pain clinic.  He continues to have numbness and weakness in R arm - he had anterior cervical decompression surgery 02/13/2024.  Reviewed HbA1c: Lab Results  Component Value Date   HGBA1C 6.6 (H) 02/06/2024   HGBA1C 6.2 (A) 12/03/2023   HGBA1C 6.7 (H) 10/02/2023   HGBA1C 6.7 (A) 06/04/2023   HGBA1C 6.4 (A) 11/08/2022   HGBA1C 7.4 (A) 08/09/2022   HGBA1C 6.6 (A) 04/04/2022   HGBA1C 6.1 (A) 01/16/2022   HGBA1C 6.0 (A) 12/26/2021   HGBA1C 6.0 (A) 09/22/2021   Pt is on a regimen of: - Lantus  7 units in  am 6/7 days >> daily >> off >> restarted 8 units daily - Farxiga  5 mg daily in a.m.-restarted 07/2022  Pt checks his blood sugars 1-2 a day - am: 108, 130-171, 221 >> 107-171 >> 120-140 - 2h after b'fast: n/c - before lunch: n/c >> 119-128 - 2h after lunch: n/c >> 115, 226 >> n/c - before dinner: 130-140, 150 >> 102 >> after a beer: 150-160 - 2h after dinner: n/c >> 130-140, but 170-180 (pasta) - bedtime: n/c - nighttime: n/c Lowest sugar was 108 >> 99  >> 92; he has hypoglycemia awareness at 90.  Highest sugar was 221 >> 226 >> 180 (pasta).  Glucometer: Accu-Chek guide  Pt's meals are: - Breakfast: none - Lunch: none - Dinner: canned soup, hamburger helper or other meat, rice, beans, corn - Snacks: tea with stevia  - + CKD - saw [facility] but not anymore, last BUN/creatinine:  Lab Results  Component Value Date   BUN 18 04/08/2024   BUN 13 02/06/2024   CREATININE 0.77  04/08/2024   CREATININE 0.86 02/06/2024   Lab Results  Component Value Date   MICRALBCREAT NOTE 12/03/2023   MICRALBCREAT 4 01/16/2022   MICRALBCREAT 11.6 08/06/2018   -+ Dyslipidemia; last set of lipids: Lab Results  Component Value Date   CHOL 153 12/18/2023   HDL 35.90 (L) 12/18/2023   LDLCALC 89 12/18/2023   TRIG 140.0 12/18/2023   CHOLHDL 4 12/18/2023  He is not on a statin.  - last eye exam was on 05/2022. + NPDR OU, without macular edema.  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   - + numbness and tingling in his feet.  Last foot exam 12/03/2023. He is taking collagen + peptides + retinol. He also takes B vitamins with Benfotiamine for neuropathy.  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  ROS: + see HPI  Past Medical History:  Diagnosis Date   ADHD (attention deficit hyperactivity disorder)    Diagnosed during teenage years   Alcohol use disorder    On average 14-21 drinks per week   Alcoholic cirrhosis of liver    Per patient - condition is not related to moderate alcohol use but rather prolonged NSAID use and recent abuse.   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[place]   Major depressive disorder    Other spondylosis with radiculopathy, cervical region    Retinopathy    Right eye; per patient report   Type 2 diabetes mellitus with hyperglycemia    Past Surgical History:  Procedure Laterality Date   ANTERIOR CERVICAL  DECOMPRESSION/DISCECTOMY FUSION 4 LEVELS N/A 02/13/2024   Procedure: ANTERIOR CERVICAL DECOMPRESSION/DISCECTOMY FUSION, CERVICAL FOUR - CERVICAL FIVE, CERVICAL FIVE - CERVICAL SIX, CERVICAL SIX - CERVICAL SEVEN, CERVICAL SEVEN - THORACIC ONE;  Surgeon: Colon Shove, MD;  Location: [facility] OR;  Service: Neurosurgery;  Laterality: N/A;   CHOLECYSTECTOMY N/A 06/02/2018   Procedure: LAPAROSCOPIC CHOLECYSTECTOMY;  Surgeon: Nicholaus Selinda Birmingham, MD;  Location: [facility] ORS;  Service: General;  Laterality: N/A;   KNEE ARTHROSCOPY Right    30 Years ago   lumbar five-sacral one posterior lumbar interbody fusion  01/24/2021   LUMBAR LAMINECTOMY/DECOMPRESSION MICRODISCECTOMY Bilateral 10/07/2023   Procedure: Laminectomy for Facet/Synovial Cyst - Lumbar Four-Lumbar Five - Bilateral;  Surgeon: Colon Shove, MD;  Location: [facility] OR;  Service: Neurosurgery;  Laterality: Bilateral;  C3   Social History   Socioeconomic History   Marital status: Single    Spouse name: Not on file   Number of children: 0   Years of education: 31   Highest education level: Master's degree (e.g., MA, MS, MEng, MEd, MSW, MBA)  Occupational History   Not on file  Tobacco Use   Smoking status: Former    Current packs/day: 0.00    Types: Cigarettes    Quit date: 06/25/2003    Years since quitting: 20.9   Smokeless tobacco: Never   Tobacco comments:    Currently vapes CBD oil  Vaping Use   Vaping status: Every Day   Substances: CBD  Substance and Sexual Activity   Alcohol use: Not Currently    Alcohol/week: 42.0 - 56.0 standard drinks of alcohol    Types: 42 - 56 Cans of beer per week    Comment: 6-8 beers per day   Drug use: Yes    Types: Marijuana    Comment: twice/week to help sleep; edibles, does not smoke   Sexual activity: Yes  Other Topics Concern   Not on file  Social History Narrative   Not on file   Social Drivers of Health   Financial Resource Strain: Medium Risk (11/25/2023)   Overall Financial Resource Strain  (CARDIA)    Difficulty of Paying Living Expenses: Somewhat hard  Food Insecurity: No Food Insecurity (05/13/2024)   Hunger Vital Sign    Worried About Running Out of Food in the Last Year: Never true    Ran Out of Food in the Last Year: Never true  Recent Concern: Food Insecurity - Food Insecurity Present (04/15/2024)   Hunger Vital Sign    Worried About Running Out of Food in the Last Year: Sometimes true    Ran Out of Food in the Last Year: Sometimes true  Transportation Needs: No Transportation Needs (05/13/2024)   PRAPARE - Administrator, Civil Service (Medical): No    Lack of Transportation (Non-Medical): No  Physical Activity: Insufficiently Active (03/25/2023)   Exercise Vital Sign    Days of Exercise per Week: 3 days    Minutes of Exercise per Session: 10 min  Stress: Stress Concern Present (03/18/2023)   [employer] of Occupational Health - Occupational Stress Questionnaire    Feeling of Stress : To some extent  Social Connections: Socially Isolated (05/24/2023)   Social Connection and Isolation Panel    Frequency of Communication with Friends and Family: More than three times a week    Frequency of Social Gatherings with Friends and Family: More than three times a week    Attends Religious Services: Never    Database administrator or Organizations: No    Attends Banker Meetings: Never    Marital Status: Never married  Intimate Partner Violence: Not At Risk (05/13/2024)   Humiliation, Afraid, Rape, and Kick questionnaire    Fear of Current or Ex-Partner: No    Emotionally Abused: No    Physically Abused: No    Sexually Abused: No   Current Outpatient Medications on File Prior to Visit  Medication Sig Dispense Refill   ALPHA LIPOIC ACID PO Take 4 capsules by mouth daily with lunch.     aspirin 325 MG tablet Take 1,300 mg by mouth every 8 (eight) hours as needed. (Patient taking differently: Take 1,300 mg by mouth every 8 (eight) hours as needed.  States rotates with naproxen and ibuprofen )     BLACK CURRANT SEED OIL PO Take 1 capsule by mouth daily with lunch.     celecoxib  (CELEBREX ) 200 MG capsule Take 1 capsule (200 mg total) by mouth 2 (two) times daily. 60 capsule 0   Cholecalciferol (VITAMIN D -3 PO) Take 5,000 Units by mouth daily with lunch.     cyclobenzaprine  (FLEXERIL ) 5 MG tablet Take 1 tablet (5 mg total) by mouth 2 (two) times daily as needed for muscle spasms. 20 tablet 0   cyclobenzaprine  (FLEXERIL ) 5 MG tablet Take 1 tablet (5 mg total) by mouth 2 (two) times daily as needed for muscle spasms. 30 tablet 1   dapagliflozin  propanediol (FARXIGA ) 5 MG TABS tablet Take 1 tablet (5 mg total) by mouth daily before breakfast. 90 tablet 3   diclofenac  (VOLTAREN ) 50 MG EC tablet Take 50 mg by mouth daily as needed (pain). (Patient not taking: Reported on 05/01/2024)     diclofenac  Sodium (VOLTAREN  ARTHRITIS PAIN) 1 % GEL Apply 2 g topically 4 (four) times daily. 150 g 1   glucose blood (ACCU-CHEK GUIDE) test strip USE AS DIRECTED 2 TIMES DAILY 180 strip 0   ibuprofen  (ADVIL ) 200 MG tablet Take 800-1,200 mg by mouth 2 (two) times daily as needed (pain.).     imatinib  (GLEEVEC ) 400 MG tablet Take 1 tablet (400 mg total) by mouth daily. Take with meals and large glass of water.Caution:Chemotherapy. 30 tablet 5   insulin  glargine (LANTUS  SOLOSTAR) 100 UNIT/ML Solostar Pen Inject 7 Units into the skin every morning. 15 mL 3   Insulin  Pen Needle (TECHLITE PEN NEEDLES) 32G X 4 MM MISC Use to inject Victoza  once a day 100 each 11   MAGNESIUM COMPLEX HIGH POTENCY PO Take 1 capsule by mouth in the morning.     methocarbamol  (ROBAXIN ) 500 MG tablet Take 1 tablet (500 mg total) by mouth every 6 (six) hours as needed for muscle spasms. 30 tablet 0   methocarbamol  (ROBAXIN ) 500 MG tablet Take 1 tablet (500 mg total) by mouth 3 (three) times a  day as needed for muscle spasms. 270 tablet 0   Multiple Vitamins-Minerals (ALIVE MENS GUMMY MULTIVITAMINS PO)  Take 4 each by mouth in the morning. (Patient taking differently: Take 4 each by mouth in the morning. Using centrum)     naproxen sodium (ALEVE) 220 MG tablet Take 660 mg by mouth 2 (two) times daily as needed (pain.).     OVER THE COUNTER MEDICATION Take 5 mLs by mouth every 3 (three) hours as needed (pain). Wild Lettuce Tincture (Patient not taking: Reported on 05/01/2024)     pregabalin  (LYRICA ) 100 MG capsule Take 1 capsule (100 mg total) by mouth 2 (two) times daily. (Patient taking differently: Take 100 mg by mouth at bedtime as needed (pain.).) 60 capsule 2   TURMERIC-GINGER PO Take 3 capsules by mouth in the morning. With Garlic (Patient not taking: Reported on 05/13/2024)     zinc sulfate, 50mg  elemental zinc, 220 (50 Zn) MG capsule Take 220 mg by mouth daily after lunch.     No current facility-administered medications on file prior to visit.   No Known Allergies Family History  Problem Relation Age of Onset   Aneurysm Father    Drug abuse Sister    Multiple sclerosis Sister    Suicidality Maternal Aunt    Emphysema Maternal Grandfather    Diabetes Neg Hx    PE: BP (!) 140/70   Pulse 94   Ht 6' 2 (1.88 m)   Wt 218 lb (98.9 kg)   SpO2 97%   BMI 27.99 kg/m  Wt Readings from Last 10 Encounters:  06/04/24 218 lb (98.9 kg)  04/03/24 213 lb 4 oz (96.7 kg)  02/13/24 218 lb (98.9 kg)  02/06/24 221 lb 8 oz (100.5 kg)  12/18/23 217 lb 6.4 oz (98.6 kg)  12/16/23 212 lb (96.2 kg)  12/03/23 217 lb 9.6 oz (98.7 kg)  11/20/23 211 lb (95.7 kg)  10/07/23 224 lb (101.6 kg)  10/02/23 226 lb 9.6 oz (102.8 kg)   Constitutional: overweight, in NAD Eyes: no exophthalmos ENT: no thyromegaly, no cervical lymphadenopathy Cardiovascular: Tachycardia, RR, No MRG Respiratory: CTA B Musculoskeletal: no deformities Skin: no rashes Neurological: no tremor with outstretched hands  ASSESSMENT: 1. DM2, insulin -dependent, uncontrolled, with complications - PAD - DR - PN  2.   Dyslipidemia  PLAN:  1. Patient with longstanding, uncontrolled, type 2 diabetes, on oral antidiabetic regimen with SGLT2 inhibitor and also low-dose basal insulin , with improved blood sugar control at last visit and an HbA1c of 6.2%.  At that time he was not taking Lantus  consistently and we discussed about doing so.  He had another HbA1c obtained 4 months ago and this was slightly higher, but still at goal, at 6.6%. -At today's visit, sugars are mostly at goal, slightly higher before dinner, but he mentions that these are checked after he drinks a beer.  No need to change his regimen for now.  He has limited finances and his diet is not the best, but he is trying his best with this. - I suggested to:  Patient Instructions  Please continue: - Farxiga  5 mg daily in am - Lantus  8 units daily in am  Please return in 6 months with your sugar log.   - we checked his HbA1c: 5.7% (lower) - advised to check sugars at different times of the day - 1x a day, rotating check times - advised for yearly eye exams >> he is due - return to clinic in 6 months  2.  Dyslipidemia - Latest lipid panel was at goal with the exception of a slightly low HDL: Lab Results  Component Value Date   CHOL 153 12/18/2023   HDL 35.90 (L) 12/18/2023   LDLCALC 89 12/18/2023   TRIG 140.0 12/18/2023   CHOLHDL 4 12/18/2023  - He is not on a statin  Lela Fendt, MD PhD [facility] Endocrinology

## 2024-06-05 ENCOUNTER — Other Ambulatory Visit: Payer: Self-pay

## 2024-06-05 NOTE — Addendum Note (Signed)
 Addended by: CLEOTILDE ROLIN RAMAN on: 06/05/2024 08:38 AM   Modules accepted: Orders

## 2024-06-10 ENCOUNTER — Other Ambulatory Visit: Payer: Self-pay

## 2024-06-10 NOTE — Patient Outreach (Signed)
 Complex Care Management   Visit Note  06/10/2024  Name:  Gerald Olson MRN: [id number] DOB: Jul 18, 1966  Situation: Referral received for Complex Care Management related to Chronic pain I obtained verbal consent from Patient.  Visit completed with Patient  on the phone  Background:   Past Medical History:  Diagnosis Date   ADHD (attention deficit hyperactivity disorder)    Diagnosed during teenage years   Alcohol use disorder    On average 14-21 drinks per week   Alcoholic cirrhosis of liver    Per patient - condition is not related to moderate alcohol use but rather prolonged NSAID use and recent abuse.   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[place]   Major depressive disorder    Other spondylosis with radiculopathy, cervical region    Retinopathy    Right eye; per patient report   Type 2 diabetes mellitus with hyperglycemia     Assessment: Patient Reported Symptoms:  Cognitive Cognitive Status: Difficulties with attention and concentration Cognitive/Intellectual Conditions Management [RPT]: None reported or documented in medical history or problem list   Health Maintenance Behaviors: Annual physical exam  Neurological   Neurological Management Strategies: Medication therapy  HEENT HEENT Symptoms Reported: Not assessed HEENT Management Strategies: Routine screening HEENT Comment: voice significantly improved    Cardiovascular Cardiovascular Symptoms Reported: Swelling in legs or feet Cardiovascular Management Strategies: Routine screening Cardiovascular  Comment: mild left foot swelling, relieved with compression sock, BP - states elevated at times with pain, yesterday 160/101 in am, rechecked after pain meds 110/65  Respiratory Respiratory Symptoms Reported: No symptoms reported Respiratory Management Strategies: Routine screening  Endocrine Endocrine Symptoms Reported: No symptoms reported Is patient diabetic?: Yes Is patient checking blood sugars at home?: Yes List most recent blood sugar readings, include date and time of day: not regularly yesterday FBG 121, improved A1C - now 5.7, Endocrine Self-Management Outcome: 4 (good)  Gastrointestinal Gastrointestinal Symptoms Reported: No symptoms reported Additional Gastrointestinal Details: improved swallowing and voice, occasional reflux      Genitourinary Other Genitourinary Symptoms: no change    Integumentary Integumentary Symptoms Reported: No symptoms reported    Musculoskeletal Musculoskelatal Symptoms Reviewed: Back pain, Difficulty walking, Joint pain, Limited mobility, Unsteady gait, Muscle pain Additional Musculoskeletal Details: about 2 Gerald Olson ago stood up and felt pop in low back - hurting since, right wrist 6/10 shoulder 8/10 back 9/10 depending on how he moves, no cane or walker, brace for wrist, saw pain management, nerve study pending, rotates pain meds for relief, advised to notify provider of new back pain Musculoskeletal Management Strategies: Medication therapy Falls in the past year?: Yes Number of falls in past year: 2 or more Was there an injury with Fall?: No Fall Risk Category Calculator: 2 Patient Fall Risk Level: Moderate Fall Risk Patient at Risk for Falls Due to: History of fall(s), Orthopedic patient, Impaired balance/gait, Impaired mobility Fall risk Follow up: Falls evaluation completed, Falls prevention discussed  Psychosocial Psychosocial Symptoms Reported: Depression - if selected complete PHQ 2-9, Anxiety -  if selected complete GAD Additional  Psychological Details: declines SW, depression, anxiety, and frustration with hand paralysis, house in bad condition but declines assist due to fear would have to leave,  agreed to Gerald Olson referral for Utility/General Financial resources, using alcohol about 3 beers daily and marijuana occasionally, poor sleep,  states treats self with CBT, would like psycho-analysis, encouraged to focus on necessary actions to help current condition, declined LCSW assist during requested appointment Behavioral Management Strategies: Activity, Coping strategies Major Change/Loss/Stressor/Fears (CP): Medical condition, self, Environment, Resources Techniques to [place] with Loss/Stress/Change: Diversional activities      06/10/2024    PHQ2-9 Depression Screening   Little interest or pleasure in doing things Nearly every day  Feeling down, depressed, or hopeless Nearly every day  PHQ-2 - Total Score 6  Trouble falling or staying asleep, or sleeping too much Nearly every day  Feeling tired or having little energy Nearly every day  Poor appetite or overeating  Nearly every day  Feeling bad about yourself - or that you are a failure or have let yourself or your family down Nearly every day  Trouble concentrating on things, such as reading the newspaper or watching television    Moving or speaking so slowly that other people could have noticed.  Or the opposite - being so fidgety or restless that you have been moving around a lot more than usual Nearly every day  Thoughts that you would be better off dead, or hurting yourself in some way Not at all  PHQ2-9 Total Score 21  If you checked off any problems, how difficult have these problems made it for you to do your work, take care of things at home, or get along with other people Extremely dIfficult  Depression Interventions/Treatment Patient refuses Treatment    Vitals:   06/10/24 1412  BP: 110/65    Medications Reviewed Today   Medications were not reviewed in this  encounter     Recommendation:   PCP Follow-up Specialty provider follow-up Pain management and Ortho re back pain worsening Continue Current Plan of Care BSW for utility and financial resources 06/16/24 a 3:00 pm  Follow Up Plan:   Telephone follow-up 6 Ivelis Norgard  [name], MSN, RN [place] [facility]  [facility], [facility] RN Care Manager Direct Dial : [phone] Fax: [phone]

## 2024-06-10 NOTE — Patient Instructions (Signed)
 Visit Information  Mr. Grunder was given information about Medicaid Managed Care team care coordination services as a part of their Healthy [facility] Medicaid benefit. [name]   If you would like to schedule transportation through your Healthy [facility] plan, please call the following number at least 2 days in advance of your appointment: [phone]  For information about your ride after you set it up, call Ride Assist at [phone]. Use this number to activate a Will Call pickup, or if your transportation is late for a scheduled pickup. Use this number, too, if you need to make a change or cancel a previously scheduled reservation.  If you need transportation services right away, call [phone]. The after-hours call center is staffed 24 hours to handle ride assistance and urgent reservation requests (including discharges) 365 days a year. Urgent trips include sick visits, hospital discharge requests and life-sustaining treatment.  Call the [facility] Line at [phone], at any time, 24 hours a day, 7 days a week. If you are in danger or need immediate medical attention call 911.   Please see education materials related to NONE provided by MyChart link.  Patient verbalizes understanding of instructions and care plan provided today and agrees to view in MyChart. Active MyChart status and patient understanding of how to access instructions and care plan via MyChart confirmed with patient.     Telephone follow up appointment with Managed Medicaid care management team member scheduled for: 07/17/24 at 3:00 pm  [name], MSN, RN [place]  [facility], [facility] RN Care Manager Direct Dial : [phone] Fax: [phone]   Following is a copy of your plan of care:  There are no care plans that you recently modified to display for this patient.

## 2024-06-11 ENCOUNTER — Other Ambulatory Visit: Payer: Self-pay

## 2024-06-16 ENCOUNTER — Other Ambulatory Visit: Payer: Self-pay

## 2024-06-16 NOTE — Patient Instructions (Signed)
 Visit Information  Gerald Olson was given information about Medicaid Managed Care team care coordination services as a part of their Healthy [facility] Medicaid benefit. [name]   If you would like to schedule transportation through your Healthy [facility] plan, please call the following number at least 2 days in advance of your appointment: [phone]  For information about your ride after you set it up, call Ride Assist at [phone]. Use this number to activate a Will Call pickup, or if your transportation is late for a scheduled pickup. Use this number, too, if you need to make a change or cancel a previously scheduled reservation.  If you need transportation services right away, call [phone]. The after-hours call center is staffed 24 hours to handle ride assistance and urgent reservation requests (including discharges) 365 days a year. Urgent trips include sick visits, hospital discharge requests and life-sustaining treatment.  Call the [facility] Line at [phone], at any time, 24 hours a day, 7 days a week. If you are in danger or need immediate medical attention call 911.    Social Worker will follow up with patient on 07/01/24.   [name], [name], [name]  [facility] Social Worker, Population Health [phone]   Following is a copy of your plan of care:  There are no care plans that you recently modified to display for this patient.

## 2024-06-16 NOTE — Patient Outreach (Signed)
 Complex Care Management   Visit Note  06/16/2024  Name:  Gerald Olson MRN: [id number] DOB: Nov 30, 1965  Situation: Referral received for Complex Care Management related to SDOH Barriers:  Lack of essential utilities [employer] Financial Resource Strain I obtained verbal consent from Patient.  Visit completed with Patient  on the phone  Background:   Past Medical History:  Diagnosis Date   ADHD (attention deficit hyperactivity disorder)    Diagnosed during teenage years   Alcohol use disorder    On average 14-21 drinks per week   Alcoholic cirrhosis of liver    Per patient - condition is not related to moderate alcohol use but rather prolonged NSAID use and recent abuse.   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 He is still in the same home that needs a lot of work. SW asked patient if he would like to move and he declined. Patient states he would like assistance with getting firework for the cold weather. SW and patient agreed for resource for financial and utilities to be emailed to address on file.  SDOH Interventions    Flowsheet Row Patient Outreach Telephone from 06/10/2024 in [place] [facility]PART[facility] Patient Outreach Telephone from 05/13/2024 in  [place] POP[facility]ATION HEALTH DEPARTMENT Patient Outreach Telephone from 05/01/2024 in [place] [facility]ATION HEALTH DEPARTMENT Patient Outreach Telephone from 04/15/2024 in [place] POP[facility]ATION HEALTH DEPARTMENT Patient Outreach Telephone from 04/02/2024 in [place] POPULATION HEALTH DEPARTMENT Patient Outreach Telephone from 03/03/2024 in  POPULATION HEALTH DEPARTMENT  SDOH Interventions        Food Insecurity Interventions -- Intervention Not Indicated -- [employer] Provided, Other (Comment) [employer] Provided, Other (Comment)  [not using food bank often due to distance, friend and mother assisting, Actor sales] --  Housing Interventions -- Intervention Not Indicated -- Patient Declined Patient Declined  [House in need of multiple repairs, does not want to contact resources afraid will have to leave home] --  Transportation Interventions -- Intervention Not Indicated, Patient Resources Dietitian) -- Intervention Not Indicated, Patient Resources (Friends/Family) Intervention Not Indicated, Patient Resources (Friends/Family) Intervention Not Indicated, Patient Resources (Friends/Family)  Utilities Interventions -- Intervention Not Indicated Intervention Not Indicated -- Intervention Not Indicated --  Alcohol Usage Interventions -- -- Alcohol Education/Brief Advice  [patient not interested in quitting, patient states that the beer relaxes his stomach and he is able to eat] Alcohol Education/Brief Advice  [patient not interested in quitting, states aware of risks with medications and affect on health] Alcohol Education/Brief Advice  [advised to avoid ETOH with medications especially flexaril and robaxin ] --  Depression Interventions/Treatment  Patient refuses Treatment Patient refuses Treatment  [Reports having psych degrees  and treating self] -- --  [referral to LCSW] Patient refuses Treatment  [Focused on physical health at this time and issues with speaking loud enough  o phone though made aware I could understand/hear him adequately] --    Recommendation:   No recommendations at this time  Follow Up Plan:   Telephone follow-up 07/01/24 at 12pm  [name], BSW, MHA [place]  [facility] Social Worker, Population Health [phone]

## 2024-06-23 ENCOUNTER — Ambulatory Visit: Admitting: Neurology

## 2024-06-23 ENCOUNTER — Encounter: Payer: Self-pay | Admitting: Neurology

## 2024-06-23 VITALS — BP 155/96 | HR 89 | Ht 72.0 in | Wt 223.4 lb

## 2024-06-23 DIAGNOSIS — Z8639 Personal history of other endocrine, nutritional and metabolic disease: Secondary | ICD-10-CM | POA: Diagnosis not present

## 2024-06-23 DIAGNOSIS — M5010 Cervical disc disorder with radiculopathy, unspecified cervical region: Secondary | ICD-10-CM

## 2024-06-23 DIAGNOSIS — R29898 Other symptoms and signs involving the musculoskeletal system: Secondary | ICD-10-CM

## 2024-06-23 NOTE — Progress Notes (Addendum)
 [facility] [address] [place] [address] [phone]       OFFICE FOLLOW UP VISIT NOTE  [name] Date of Birth:  [birth year] Medical Record Number:  [id number]   Referring MD: Cammie Fulp Reason for Referral: Neuropathy   Chief Complaint  Patient presents with   Right.            HPI: Update 06/23/2024 : Patient is referred back for evaluation today by Dr. Ruther Gens neurosurgeon for right hand weakness which has persisted despite cervical spine surgery in June 2025.  Patient states he initially had right-sided neck pain and shoulder pain in December 2025 which spread to involve his right elbows.  Then his weakness began in January 2025.  He had significant right neck and scapular pain initially and subsequently pain went to involve his shoulders.  A month later also noticed weakness in the right hand.  In February he underwent back surgery for disc replacement but subsequently noticed worsening of right hand weakness.  He underwent extensive C4-C7 and anteriorly by Dr. Gens in June 2025 but unfortunately his right hand weakness has persisted.  He can barely move his right thumb and index finger but cannot move the remaining 3 fingers in his hand.  He denies significant neck pain or radicular pain at the present time.  Continues to have mild paresthesias in his feet and problems with balance and walking with has learned to walk carefully.  Has not had any falls.  Continues to have back pain. Update 03/07/2022 JM: Patient returns for 58-month neuropathy follow-up unaccompanied.  He continues to experience neuropathic pains with cramping and numbness in bilateral lower extremities as well as occasionally left hand.  He believes his diabetic neuropathy has been worsening.  Continues to follow with Dr. Gens for spine and LLE pain, he has had a couple falls since prior visit due to this pain but thankfully without significant injury.  Previously tried  amitriptyline  but unable to tolerate due to fatigue, previously no benefit on topiramate  nor gabapentin . He questions benefit with use of Qutenza . No further concerns at this time.     History provided for reference purposes only Update 09/11/2021 JM: Patient returns for 58-month follow-up unaccompanied. Reports continued left leg pain persistence since decompression procedure 12/2020. Reports self discontinued topamax  around December as he didn't believe any benefit - denies worsening since stopping - continues tramadol  only 2-3x per week managed by PCP. Does report some worsening right side back pain back in September but feels he may have over did it as he was trying to increase exercise. Seen by ortho 06/2021 who advised to f/u with neurosurgery but has not yet seen them - he was also advised of this at prior visit 6 months ago. Obtained xrays during ortho visit which showed in-place hardware from L5-S1 fusion and no acute pathology per ortho report (unable to personally view via epic).  No further concerns at this time  Update 03/01/2021 JM: Mr. [name] returns for 58-month follow-up regarding neuropathy and cognitive complaints.  Since prior visit, underwent surgical decompression and arthrodesis at L5-S1 by Dr. Gens for spondylolisthesis L5-S1 with lumbar radiculopathy.  He reports increased pain since procedure particularly left leg -he reports recently being seen by Dr. Gens and was told to further discuss pain management options with our office today (again, per patient report). He has remained on topiramate  50 mg AM and 100 mg PM in combination with alpha-lipoic acid which have previously been  stable for diabetic neuropathy.  He has no longer on duloxetine .  He discusses use of Percocet after procedure but this makes him too drowsy.  He was seen by his PCP who placed him on tramadol  with benefit -he reports previously being seen by pain management but does not wish to go back due to restriction of  other providers managing pain or prescribing medications.  He continues to follow with psychiatry for depression with recent visit 6/14.  Reports cognition stable since prior visit.  He was also seen by PCP 6/14 but unable to access OV note for review.  No further concerns at this time  Update 08/30/2020 JM: Mr. [name] returns for follow-up after being seen 4 months prior for neuropathy and cognitive complaints.  Remains on topiramate  50 mg twice daily for diabetic neuropathy initially providing benefit and even improving especially in combination with alpha-lipoic acid but unfortunately suffered a fall approximately 1 month ago not experiencing thoracic pain and worsening left leg radiculopathy. He has follow-up visit with orthopedics on Friday for further evaluation. Cognition stable without worsening.  Initiated duloxetine  at prior visit in hopes of benefit of depression as well as paresthesias but he self discontinued as he did not feel as though this was beneficial and reported dizziness.  He routinely is followed by psychiatry for depression and anxiety but currently declines interest in antidepression medications. He continues to follow with orthopedics Dr. Oneil Herald for spinal and lumbar stenosis previously participating in outpatient PT but currently on hold due to recent fall. 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 No further concerns at this time.  Update 04/27/2020 JM: Mr. [name] returns today for neuropathy follow-up Since prior visit, he has noticed some improvement in regards to right leg > left leg neuropathy but does report continued numbness sensation.  He believes Topamax  25 mg nightly has been beneficial and he has tolerated well.  He did trial taking 2 tablets for a couple of nights with further improvement and denies side effects.   He is also been having increased cervical pain radiating down left arm.  He has  not contacted Dr. Herald in regards to worsening symptoms over the past 2 months.  He also reports right shoulder burning type pain that can wake him up while sleeping.  He reports he has not slept in the past 3 nights due to neck and shoulder pain.  He questions possibility of fibromyalgia due to multiple pain locations. Awaiting lumbar and cervical procedure once diabetes becomes well controlled but he continues to have difficulty.  Continues to follow with endocrinology. He also complains of cognitive concerns including short-term and long-term memory, delayed recall and occasional word finding difficulty.  He denies any prior stroke history or TBI.  He did have neurocognitive evaluation by Dr. Richie on 03/31/2020 did show just chronic weakness across processing speed and variability noted approximately complex attention as well as encoding and retrieval aspects of verbal and visual memory.  Etiology potentially pronounced psychiatric distress, ongoing sleep dysfunction, chronic pain, history of ADHD, vascular factors such as uncontrolled diabetes and potentially vascular changes would affect similar areas of cognitive functioning which could contribute to ongoing deficits.  No concerns for early onset neurodegenerative illness such as Alzheimer's disease at the time of testing. Recommendations of obtaining MRI for further evaluation as well as medication and psychotherapy for treatment of anxiety and depression as well as pain management and different lifestyle  options (heavy alcohol intake, heavy caffeine intake, and THC use).  Update 01/28/2020 Dr. Rosemarie : He returns for follow-up after last visit 6 months ago.  He states that he did increase the gabapentin  but was taking it only twice daily.  It helps with the pins-and-needles sensation but is still in constant pain as well as feels numb.  In the last few weeks he feels symptoms may gotten worse.  Is also gained about 20 pounds this year.  He also complains of  involuntary myoclonic jerk-like movements in his left hand.  He had EMG nerve conduction study done on 10/22/2019 by Dr. 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  Lab works for reversible causes of neuropathy on 10/08/2019 pretty much normal except for low vitamin D  levels of 114.  Patient states he has known low vitamin D  level since last 20 years and was on vitamin D  replacement however few weeks prior to seeing me and last visit here ran out of vitamin D  and had not restarted taking it due to financial reasons.  He has chronic back pain with left leg radiculopathy and he plans on having back surgery but needs to lose some weight prior to that.  He has been taking while like to switch seems to help his chronic pain.  He has not tried Topamax  and is willing to try that.  Initial visit 10/08/2019 Dr. Rosemarie: Mr. Neysa is a 58 year old Caucasian male with past medical history of diabetes, hypertension, obesity, degenerative arthritis and chronic back and neck pain who has been having lower extremity paresthesias for the last 6 months which seem to be progressive.  He complains of constant burning, tingling and pins and needle sensation.  At times this becomes bearable.  This is present throughout the day.  He recently started taking gabapentin  100 mg twice daily which is not being effective.  He is also noticed that his balance is poor and has had frequent falls.  In fact he had a fall yesterday and today seems to be in acute pain and his gait is limited.  He has a longstanding history of degenerative cervical and lumbar spine disease.  He has been seen by orthopedic surgeon wants to operate on his neck but his diabetes is out of control and is working with endocrinologist to bring it under control so he can have the surgery.  Review of electronic medical records show that he had a  cervical spine x-ray on 09/22/2019 which showed degenerative changes from C3-C7.  X-rays of lumbar spine on 07/21/2018 show significant facet degenerative changes at L5-S1 with significant foraminal narrowing on the right at the L4-5 and on the left at L5-S1.  He takes tramadol  and muscle relaxant Robaxin  which helps partially only.  He has not had any EMG nerve conduction studies done or lab work for other reversible causes of neuropathy done.  He states his feet are now constantly numb with lack of feeling which has been contributing to his falling.  His sugars have been persistently high and last A1c on 10/01/2019 was yet high at 7.6 but improved from 9.1 from 3 months ago.      ROS:   14 system review of systems is positive for see HPI and all other systems negative  PMH:  Past Medical History:  Diagnosis Date   ADHD (attention deficit hyperactivity disorder)    Diagnosed during teenage  years   Alcohol use disorder    On average 14-21 drinks per week   Alcoholic cirrhosis of liver    Per patient - condition is not related to moderate alcohol use but rather prolonged NSAID use and recent abuse.   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 [place]   Major depressive disorder    Other spondylosis with radiculopathy, cervical region    Retinopathy    Right eye; per patient report   Type 2 diabetes mellitus with hyperglycemia     Social History:  Social History   Socioeconomic History   Marital status: Single    Spouse name: Not on file   Number of children: 0   Years of  education: 18   Highest education level: Master's degree (e.g., MA, MS, MEng, MEd, MSW, MBA)  Occupational History   Not on file  Tobacco Use   Smoking status: Former    Current packs/day: 0.00    Types: Cigarettes    Quit date: 06/25/2003    Years since quitting: 21.0   Smokeless tobacco: Never   Tobacco comments:    Currently vapes CBD oil  Vaping Use   Vaping status: Every Day   Substances: CBD  Substance and Sexual Activity   Alcohol use: Not Currently    Alcohol/week: 42.0 - 56.0 standard drinks of alcohol    Types: 42 - 56 Cans of beer per week    Comment: 6-8 beers per day   Drug use: Yes    Types: Marijuana    Comment: twice/week to help sleep; edibles, does not smoke   Sexual activity: Yes  Other Topics Concern   Not on file  Social History Narrative   Not on file   Social Drivers of Health   Financial Resource Strain: High Risk (06/16/2024)   Overall Financial Resource Strain (CARDIA)    Difficulty of Paying Living Expenses: Hard  Food Insecurity: Food Insecurity Present (06/16/2024)   Hunger Vital Sign    Worried About Running Out of Food in the Last Year: Sometimes true    Ran Out of Food in the Last Year: Not on file  Transportation Needs: No Transportation Needs (06/16/2024)   PRAPARE - Administrator, Civil Service (Medical): No    Lack of Transportation (Non-Medical): No  Physical Activity: Insufficiently Active (03/25/2023)   Exercise Vital Sign    Days of Exercise per Week: 3 days    Minutes of Exercise per Session: 10 min  Stress: Stress Concern Present (03/18/2023)   [employer] of Occupational Health - Occupational Stress Questionnaire    Feeling of Stress : To some extent  Social Connections: Socially Isolated (05/24/2023)   Social Connection and Isolation Panel    Frequency of Communication with Friends and Family: More than three times a week    Frequency of Social Gatherings with Friends and Family: More than three times a  week    Attends Religious Services: Never    Database Administrator or Organizations: No  Attends Banker Meetings: Never    Marital Status: Never married  Intimate Partner Violence: Not At Risk (05/13/2024)   Humiliation, Afraid, Rape, and Kick questionnaire    Fear of Current or Ex-Partner: No    Emotionally Abused: No    Physically Abused: No    Sexually Abused: No    Medications:   Current Outpatient Medications on File Prior to Visit  Medication Sig Dispense Refill   aspirin 325 MG tablet Take 1,300 mg by mouth every 8 (eight) hours as needed. (Patient taking differently: Take 1,300 mg by mouth every 8 (eight) hours as needed. States rotates with naproxen and ibuprofen )     BLACK CURRANT SEED OIL PO Take 1 capsule by mouth daily with lunch.     celecoxib  (CELEBREX ) 200 MG capsule Take 1 capsule (200 mg total) by mouth 2 (two) times daily. 60 capsule 0   Cholecalciferol (VITAMIN D -3 PO) Take 5,000 Units by mouth daily with lunch.     cyclobenzaprine  (FLEXERIL ) 5 MG tablet Take 1 tablet (5 mg total) by mouth 2 (two) times daily as needed for muscle spasms. 20 tablet 0   cyclobenzaprine  (FLEXERIL ) 5 MG tablet Take 1 tablet (5 mg total) by mouth 2 (two) times daily as needed for muscle spasms. 30 tablet 1   dapagliflozin  propanediol (FARXIGA ) 5 MG TABS tablet Take 1 tablet (5 mg total) by mouth daily before breakfast. 90 tablet 3   glucose blood (ACCU-CHEK GUIDE) test strip USE AS DIRECTED 2 TIMES DAILY 180 strip 0   ibuprofen  (ADVIL ) 200 MG tablet Take 800-1,200 mg by mouth 2 (two) times daily as needed (pain.).     insulin  glargine (LANTUS  SOLOSTAR) 100 UNIT/ML Solostar Pen Inject 8 Units into the skin every morning. Discard unused portion of pen 28 days after first use. 15 mL 3   Insulin  Pen Needle (TECHLITE PEN NEEDLES) 32G X 4 MM MISC Use to inject Victoza  once a day 100 each 11   MAGNESIUM COMPLEX HIGH POTENCY PO Take 1 capsule by mouth in the morning.      methocarbamol  (ROBAXIN ) 500 MG tablet Take 1 tablet (500 mg total) by mouth every 6 (six) hours as needed for muscle spasms. 30 tablet 0   methocarbamol  (ROBAXIN ) 500 MG tablet Take 1 tablet (500 mg total) by mouth 3 (three) times a day as needed for muscle spasms. 270 tablet 0   pregabalin  (LYRICA ) 100 MG capsule Take 1 capsule (100 mg total) by mouth 2 (two) times daily. (Patient taking differently: Take 100 mg by mouth at bedtime as needed (pain.).) 60 capsule 2   zinc sulfate, 50mg  elemental zinc, 220 (50 Zn) MG capsule Take 220 mg by mouth daily after lunch.     ALPHA LIPOIC ACID PO Take 4 capsules by mouth daily with lunch. (Patient not taking: Reported on 06/23/2024)     diclofenac  (VOLTAREN ) 50 MG EC tablet Take 50 mg by mouth daily as needed (pain). (Patient not taking: Reported on 06/23/2024)     diclofenac  Sodium (VOLTAREN  ARTHRITIS PAIN) 1 % GEL Apply 2 g topically 4 (four) times daily. (Patient not taking: Reported on 06/23/2024) 150 g 1   imatinib  (GLEEVEC ) 400 MG tablet Take 1 tablet (400 mg total) by mouth daily. Take with meals and large glass of water.Caution:Chemotherapy. (Patient not taking: Reported on 06/23/2024) 30 tablet 5   Multiple Vitamins-Minerals (ALIVE MENS GUMMY MULTIVITAMINS PO) Take 4 each by mouth in the morning. (Patient not taking: Reported on 06/23/2024)  naproxen sodium (ALEVE) 220 MG tablet Take 660 mg by mouth 2 (two) times daily as needed (pain.). (Patient not taking: Reported on 06/23/2024)     OVER THE COUNTER MEDICATION Take 5 mLs by mouth every 3 (three) hours as needed (pain). Wild Lettuce Tincture (Patient not taking: Reported on 06/23/2024)     TURMERIC-GINGER PO Take 3 capsules by mouth in the morning. With Garlic (Patient not taking: Reported on 06/23/2024)     No current facility-administered medications on file prior to visit.    Allergies:   No Known Allergies   Physical exam:   Today's Vitals   06/23/24 1140  BP: (!) 155/96  Pulse: 89   SpO2: 97%  Weight: 223 lb 6.4 oz (101.3 kg)  Height: 6' (1.829 m)   Body mass index is 30.3 kg/m.   Physical Exam General: Pleasant middle-aged Caucasian male seated, in no evident distress Head: head normocephalic and atraumatic.   Neck: supple with no carotid or supraclavicular bruits Cardiovascular: regular rate and rhythm, no murmurs Skin:  no rash/petichiae Vascular:  Normal pulses all extremities  Neurologic Exam Mental Status: Awake and fully alert. Oriented to place and time. Recent and remote memory intact during visit. Attention span, concentration and fund of knowledge appropriate during visit. Mood and affect appropriate.   Cranial Nerves: Pupils equal, briskly reactive to light. Extraocular movements full without nystagmus. Visual fields full to confrontation. Hearing intact. Facial sensation intact. Face, tongue, palate moves normally and symmetrically.  Motor: Significant weakness in right hand with no movement in the ulnar 3 fingers.  Able to flex the index finger and thumb a bit.  Weak right grip.  Mild weakness of right wrist extensors and flexors.  Good strength at the right elbow.  Shoulder movements limited due to shoulder pain.  Good strength in both lower extremities..   Sensory.:  Diminished   touch , pinprick sensation in both feet stocking distribution.  Impaired vibration in both feet from ankle down.  Position sense mildly impaired.   Coordination: Rapid alternating movements normal in all extremities. Finger-to-nose and heel-to-shin performed accurately bilaterally. Fine action tremor of both outstretched upper extremities left greater than right - stable.  No pill-rolling or cogwheel rigidity. Gait and Station: Arises from chair with  difficulty. Stance is stooped and favors his back and left leg.  Broad-based  gait without use of AD.  Difficulty performing tandem walking heel toe Reflexes: 1+ and symmetric in upper extremities and absent in both lower  extremities. Toes downgoing.        ASSESSMENT/PLAN: 58 year old Caucasian male with lower extremity paresthesias and pain due to worsening diabetic peripheral neuropathic pain.  He also has chronic back pain and left leg sciatica as well as increasing falls and gait difficulties due to degenerative lumbar and cervical spine disease. Prior complaints of cognitive deficits which have been stable.  New complaints of right hand weakness since January 2025 likely due to progressive lower cervical radiculopathy C7-T1 for which he underwent C-spine surgery but unfortunately has not shown significant improvement.  Other possibilities include diabetic brachial neuritis versus lower trunk brachial plexopathy.  I had a long discussion with the patient regarding his subacute right hand weakness which is likely from compressive cervical radiculopathy unfortunately after C-spine surgery he has not shown any significant improvement.  Further evaluation with checking EMG nerve conduction study to confirm neuropathy and encouraged him to keep his upcoming appointment with physical and Occupational Therapy and to right hand splints to avoid contractures.  Continue aspirin for stroke prevention and maintain aggressive risk factor modification.  Return for follow-up in the future only as necessary and no scheduled appointment was made.    I personally spent a total of 40 minutes in the care of the patient today including getting/reviewing separately obtained history, performing a medically appropriate exam/evaluation, counseling and educating, placing orders, referring and communicating with other health care professionals, documenting clinical information in the EHR, independently interpreting results, and coordinating care.        Eather Popp, MD  [facility] [address] [place], [place] [address]  Phone [phone] Fax [phone] Note: This document was prepared with  digital dictation and possible smart phrase technology. Any transcriptional errors that result from this process are unintentional. What is mild for Elevated When She Went to normal already Having normal UTI yearly normal discharge diet adjusted

## 2024-06-23 NOTE — Patient Instructions (Signed)
 I had a long discussion with the patient regarding his subacute right hand weakness which is likely from compressive cervical radiculopathy unfortunately after C-spine surgery he has not shown any significant improvement.  Further evaluation with checking EMG nerve conduction study to confirm neuropathy and encouraged him to keep his upcoming appointment with physical and Occupational Therapy and to right hand splints to avoid contractures.  Continue aspirin for stroke prevention and maintain aggressive risk factor modification.  Return for follow-up in the future only as necessary and no scheduled appointment was made.

## 2024-06-29 ENCOUNTER — Encounter: Payer: Self-pay | Admitting: Radiology

## 2024-07-01 ENCOUNTER — Telehealth: Payer: Self-pay

## 2024-07-01 NOTE — Patient Instructions (Signed)
 Camellia MARLA Rothman - I am sorry I was unable to reach you today for our scheduled appointment. I work with [name], NP and am calling to support your healthcare needs. Please contact me at [phone] at your earliest convenience. I look forward to speaking with you soon.   Thank you,  [name], BSW, MHA [place]  [facility] Social Worker, Population Health [phone]

## 2024-07-02 ENCOUNTER — Other Ambulatory Visit: Payer: Self-pay

## 2024-07-06 ENCOUNTER — Encounter: Payer: Self-pay | Admitting: General Practice

## 2024-07-06 ENCOUNTER — Ambulatory Visit: Admitting: General Practice

## 2024-07-06 ENCOUNTER — Other Ambulatory Visit: Payer: Self-pay

## 2024-07-06 VITALS — BP 154/100 | HR 96 | Temp 99.0°F | Ht 72.0 in | Wt 215.0 lb

## 2024-07-06 DIAGNOSIS — Z7185 Encounter for immunization safety counseling: Secondary | ICD-10-CM | POA: Insufficient documentation

## 2024-07-06 DIAGNOSIS — E1165 Type 2 diabetes mellitus with hyperglycemia: Secondary | ICD-10-CM | POA: Diagnosis not present

## 2024-07-06 DIAGNOSIS — E1159 Type 2 diabetes mellitus with other circulatory complications: Secondary | ICD-10-CM

## 2024-07-06 DIAGNOSIS — Z7984 Long term (current) use of oral hypoglycemic drugs: Secondary | ICD-10-CM

## 2024-07-06 DIAGNOSIS — M25531 Pain in right wrist: Secondary | ICD-10-CM

## 2024-07-06 DIAGNOSIS — I152 Hypertension secondary to endocrine disorders: Secondary | ICD-10-CM | POA: Diagnosis not present

## 2024-07-06 DIAGNOSIS — M25511 Pain in right shoulder: Secondary | ICD-10-CM

## 2024-07-06 DIAGNOSIS — G8929 Other chronic pain: Secondary | ICD-10-CM | POA: Diagnosis not present

## 2024-07-06 DIAGNOSIS — R29898 Other symptoms and signs involving the musculoskeletal system: Secondary | ICD-10-CM

## 2024-07-06 NOTE — Progress Notes (Signed)
 Established Patient Office Visit  Subjective   Patient ID: Gerald Olson, male    DOB: 1966-06-30  Age: 57 y.o. MRN: [id number]  Chief Complaint  Patient presents with   Pain    In back, arm and wrist; patient is in a lot of pain today after PT last week.     HPI  Gerald Olson is a 58 year old male with past medical history HTN, DM, vocal cord paralysis, alcoholic cirrhosis of liver, DM2, MDD, cervical mylelopathy,, alcohol disorder, MDD, ADHD, GAD other fatigue, dysphonia, chronic pain syndrome presents today for chronic care management.   Discussed the use of AI scribe software for clinical note transcription with the patient, who gave verbal consent to proceed.  History of Present Illness Gerald Olson is a 58 year old male with chronic pain and diabetes who presents with worsening right shoulder pain and a request for a nerve conduction study.  He has been experiencing worsening right shoulder pain for several months, with the pain increasing in severity. He is unable to raise his arm above shoulder level and describes the pain as shooting, reaching a nine out of ten with movement.  He experiences numbness in his right hand, describing it as a 'dead appendage.' The numbness began after pain that started in the neck, moved to the shoulder blade, shoulder, elbow, and then the wrist. He is scheduled for a nerve conduction study on January 12th but is seeking an earlier appointment due to the severity of his symptoms.  He has a history of low back pain and has undergone two surgeries, including a fusion. He describes a recent exacerbation of low back pain, which he rates as nine to ten out of ten, affecting his sleep. He takes four Percocet daily, which provides minimal relief, along with ibuprofen  and extra strength Tylenol , which do not alleviate the pain.  He is currently undergoing physical therapy for neck and low back pain. He reports new mid-back pain that began after physical  therapy, describing it as severe and debilitating, preventing him from sitting upright or sleeping on his side.  He has diabetes with a recent A1c of 5.7. He takes Lantus , approximately seven units every other day, and Farxiga  daily. He mentions inconsistent insulin  use due to low food intake.  He experiences leg cramps, which he associates with methocarbamol , and reports taking Flexeril . Methocarbamol  does not alleviate his symptoms.  No blood pressure medication use. He reports sweating and elevated blood pressure during pain episodes.     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, with long-term current use of insulin  (HCC) 11/20/2023   Right arm weakness 11/20/2023   Establishing care with new doctor, encounter for 11/20/2023   Encounter for screening involving social determinants of health (SDoH) 11/20/2023   Other spondylosis with radiculopathy, lumbar region 10/07/2023   Onychomycosis 09/21/2022   Other fatigue 09/20/2021   Spondylolisthesis at L5-S1 level 01/24/2021   Cervical radicular pain 10/26/2020   Lumbar facet arthropathy 10/26/2020   Lumbar degenerative disc disease 10/26/2020   Fall 08/09/2020   Depressive disorder due to  another medical condition with mixed features 08/09/2020   Mesenteric lymphadenopathy 06/14/2020   Splenomegaly- mild  06/14/2020   History of cannabis abuse 06/14/2020   Recurrent major depressive disorder, in partial remission 06/09/2020   Anxiety 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 06/09/2020   Alcohol use disorder, moderate,  dependence (HCC) 06/09/2020   Foraminal stenosis of cervical region 05/09/2020   Diabetic polyneuropathy    ADHD (attention deficit hyperactivity disorder)    Generalized anxiety disorder    Major depressive disorder    Cervical spondylosis 09/22/2019   Hypertension associated with diabetes (HCC) 01/28/2019   Alcoholic cirrhosis of liver 06/02/2018   Type 2 diabetes mellitus with hyperglycemia 04/25/2018   Past Medical History:  Diagnosis Date   ADHD (attention deficit hyperactivity disorder)    Diagnosed during teenage years   Alcohol use disorder    On average 14-21 drinks per week   Alcoholic cirrhosis of liver    Per patient - condition is not related to moderate alcohol use but rather prolonged NSAID use and recent abuse.   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 [place]   Major depressive disorder    Other spondylosis with radiculopathy, cervical region    Retinopathy    Right eye; per patient report   Type 2 diabetes mellitus with hyperglycemia    Past Surgical History:  Procedure Laterality Date   ANTERIOR CERVICAL DECOMPRESSION/DISCECTOMY FUSION 4 LEVELS N/A 02/13/2024   Procedure: ANTERIOR CERVICAL DECOMPRESSION/DISCECTOMY FUSION, CERVICAL FOUR - CERVICAL FIVE, CERVICAL FIVE - CERVICAL SIX, CERVICAL SIX - CERVICAL SEVEN, CERVICAL SEVEN - THORACIC ONE;  Surgeon: Colon Shove, MD;  Location: [facility] OR;  Service: Neurosurgery;  Laterality: N/A;   CHOLECYSTECTOMY N/A 06/02/2018   Procedure: LAPAROSCOPIC CHOLECYSTECTOMY;  Surgeon: Nicholaus Selinda Birmingham, MD;  Location: [facility]  ORS;  Service: General;  Laterality: N/A;   KNEE ARTHROSCOPY Right    30 Years ago   lumbar five-sacral one posterior lumbar interbody fusion  01/24/2021   LUMBAR LAMINECTOMY/DECOMPRESSION MICRODISCECTOMY Bilateral 10/07/2023   Procedure: Laminectomy for Facet/Synovial Cyst - Lumbar Four-Lumbar Five - Bilateral;  Surgeon: Colon Shove, MD;  Location: [facility] OR;  Service: Neurosurgery;  Laterality: Bilateral;  C3   Family History  Problem Relation Age of Onset   Aneurysm Father    Drug abuse Sister    Multiple sclerosis Sister    Suicidality Maternal Aunt    Emphysema Maternal Grandfather    Diabetes Neg Hx    No Known Allergies       07/06/2024    3:51 PM 06/10/2024    2:21 PM 05/13/2024    2:24 PM  Depression screen PHQ 2/9  Decreased Interest 3 3 3   Down, Depressed, Hopeless 3 3 3   PHQ - 2 Score 6 6 6   Altered sleeping 3 3 3   Tired, decreased energy 3 3 3   Change in appetite 2 3 0  Feeling bad or failure about yourself  3 3 3   Trouble concentrating 3  3  Moving slowly or fidgety/restless 0 3 0  Suicidal thoughts 0 0 0  PHQ-9 Score 20 21  18    Difficult doing work/chores Very difficult Extremely dIfficult Extremely dIfficult     Data saved with a previous flowsheet row definition       07/06/2024    3:51 PM 06/10/2024    2:24 PM 05/01/2024    3:07 PM 04/15/2024    2:14 PM  GAD 7 : Generalized Anxiety Score  Nervous, Anxious, on Edge 3 3 3 3   Control/stop worrying 3 3 2 3   Worry too much - different things 3 3 3 3   Trouble relaxing 3 3 3 3   Restless 1 2 3 2   Easily annoyed or irritable 1 2 2 2   Afraid - awful might happen 1 2 1 2   Total GAD 7 Score 15 18 17 18   Anxiety Difficulty Extremely difficult Very difficult Very difficult Very difficult      Review of Systems  Constitutional:  Negative for chills and fever.  Respiratory:  Negative for shortness of breath.   Cardiovascular:  Negative for chest pain.  Gastrointestinal:  Negative for abdominal pain,  constipation, diarrhea, heartburn, nausea and vomiting.  Genitourinary:  Negative for dysuria, frequency and urgency.  Musculoskeletal:  Positive for back pain, joint pain and neck pain.  Neurological:  Negative for dizziness and headaches.  Endo/Heme/Allergies:  Negative for polydipsia.  Psychiatric/Behavioral:  Negative for depression and suicidal ideas. The patient is not nervous/anxious.       Objective:     BP (!) 154/100   Pulse 96   Temp 99 F (37.2 C) (Temporal)   Ht 6' (1.829 m)   Wt 215 lb (97.5 kg)   SpO2 96%   BMI 29.16 kg/m  BP Readings from Last 3 Encounters:  07/06/24 (!) 154/100  06/23/24 (!) 155/96  06/10/24 110/65   Wt Readings from Last 3 Encounters:  07/06/24 215 lb (97.5 kg)  06/23/24 223 lb 6.4 oz (101.3 kg)  06/04/24 218 lb (98.9 kg)      Physical Exam Vitals and nursing note reviewed.  Constitutional:      Appearance: Normal appearance.  Cardiovascular:     Rate and Rhythm: Normal rate and regular rhythm.     Pulses: Normal pulses.     Heart sounds: Normal heart sounds.  Pulmonary:     Effort: Pulmonary effort is normal.     Breath sounds: Normal breath sounds.  Musculoskeletal:     Right shoulder: Decreased range of motion.     Left shoulder: Normal range of motion.  Neurological:     Mental Status: He is alert and oriented to person, place, and time.  Psychiatric:        Mood and Affect: Mood normal.        Behavior: Behavior normal.        Thought Content: Thought content normal.        Judgment: Judgment normal.      No results found for any visits on 07/06/24.     The 10-year ASCVD risk score (Arnett DK, et al., 2019) is: 16.7%    Assessment & Plan:  Hypertension associated with diabetes (HCC) Assessment & Plan: BP elevated on both readings.  Attributes to pain.  Declines medication at this time.   Discussed the importance of eating healthy and exercise.  Exercise is limited due to the pain.  Will continue to  monitor.   Uncontrolled type 2 diabetes mellitus with hyperglycemia (HCC)  Chronic wrist pain, right  Right arm weakness  Chronic right shoulder pain  Vaccine counseling    Assessment and Plan Assessment & Plan Chronic right shoulder pain, possible rotator cuff injury Chronic right shoulder pain with possible rotator cuff injury, pain 9/10 with movement, no nerve impingement signs. - Referred to Dr. Watt for evaluation.  Chronic right wrist pain and dysfunction Chronic right wrist pain with dysfunction, possibly cervical spine-related, affecting daily activities. - Advised to find an earlier nerve conduction study appointment.  Chronic low back pain, post-lumbar fusion, with recurrent severe exacerbation Chronic low back pain with severe exacerbation, pain 9-10/10, inadequate relief with current regimen. - Advised to discuss pain management clinic.  Chronic neck pain Chronic neck pain, possibly cervical spine-related. - Continue physical therapy.  Type 2 diabetes mellitus, well-controlled, on insulin  and SGLT2 inhibitor Type 2 diabetes well-controlled, A1c 5.7, inconsistent Lantus  use due to dietary habits. - Following with endocrinology.  - reviewed office notes, labs from October.  - Continue Lantus  and Farxiga . - declines pneumonia vaccine.  General Health Maintenance Discussed flu, pneumonia, and Shingrix vaccines, concerns about mRNA vaccines.    Return in about 6 months (around 01/03/2025) for chronic care management.SABRA Carrol Aurora, NP

## 2024-07-06 NOTE — Patient Instructions (Addendum)
 Schedule appt with Dr. Lafonda for chronic right shoulder pain.   Follow up with pain management as scheduled.   Let me know about the neurology referral.   F/u in 6 months for chronic care management.   It was a pleasure to see you today!

## 2024-07-06 NOTE — Assessment & Plan Note (Signed)
 BP elevated on both readings.  Attributes to pain.  Declines medication at this time.   Discussed the importance of eating healthy and exercise.  Exercise is limited due to the pain.  Will continue to monitor.

## 2024-07-08 ENCOUNTER — Other Ambulatory Visit: Payer: Self-pay

## 2024-07-08 NOTE — Progress Notes (Signed)
 Gerald Olson T. Bama Hanselman, MD, CAQ Sports Medicine [facility] [address] [place], [address]  Phone: [phone]  FAX: [phone]  Gerald Olson - 58 y.o. male  MRN [id number]  Date of Birth: 10/12/1965  Date: 07/09/2024  PCP: Gerald Shivers, NP  Referral: Gerald Shivers, NP  Chief Complaint  Patient presents with   Back Pain    Started hurting after PT 07/01/24 and has progressively gotten worse   Subjective:   Gerald Olson is a 58 y.o. very pleasant male patient with Body mass index is 29.23 kg/m. who presents with the following:  Discussed the use of AI scribe software for clinical note transcription with the patient, who gave verbal consent to proceed.  It looks as if the reason of the consult was shoulder pain and weakness.  This is different from the chief complaint and the appointment scheduled.  Patient presents with middle and lower back pain. -He has seen Dr. Rosemarie for right hand weakness and neurology. He also is seeing pain management.   Also seeing Dr. Colon from neurosurgery.    - He had a multilevel cervical decompression and fusion from C4-T1.  This was done on February 13, 2024. - History of lumbar laminectomy L4-L5 and February 2025 by Dr. Colon  He also is seeing orthopedics.  Lab Results  Component Value Date   HGBA1C 5.7 (A) 06/04/2024    History of Present Illness Gerald Olson is a 58 year old male with a history of spine surgeries who presents with worsening mid-back pain.  He experiences a burning, sharp pain in the middle of his back, just off to the side, which began after a physical therapy session on November 5th. The pain has a focal point and has been worsening each day. It extends into his side, particularly when he lies on his right side, which he attributes to his sleeping position. He uses Percocet, taking two or more pills a day, to manage the pain, despite disliking the side effects such as  constipation and a sensation of 'ants crawling all over'.  He has a history of multiple spine surgeries, including an L5-S1 fusion, a spacer at L4-L5, and four-level neck fusion from C4 to C1.   Additionally, he reports a separate issue with his low back, which made an audible pop and has resulted in pain radiating down the glutes into the upper leg, similar to sciatica, but on the right side. This pain began after he stood up too quickly from a tub soak.  He is also experiencing paralysis in his arm, which has been ongoing for nearly a year. He is awaiting a nerve conduction study scheduled for January 14th. The paralysis began with neck pain, progressing to burning in the scapula, shoulder, elbow, and then the wrist, eventually leading to loss of hand function.  His social situation is challenging as he relies on a wood stove for heat and was unable to procure wood this year, impacting his ability to manage daily activities like cleaning and doing dishes.    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, with long-term current use of insulin  (HCC) 11/20/2023   Right arm weakness 11/20/2023   Establishing care with new doctor, encounter for 11/20/2023   Encounter for screening involving social determinants of health (SDoH) 11/20/2023   Other spondylosis with radiculopathy, lumbar region 10/07/2023   Onychomycosis 09/21/2022   Other fatigue 09/20/2021   Spondylolisthesis at L5-S1 level 01/24/2021   Cervical radicular pain  10/26/2020   Lumbar facet arthropathy 10/26/2020   Lumbar degenerative disc disease 10/26/2020   Fall 08/09/2020   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 08/09/2020   Mesenteric lymphadenopathy 06/14/2020   Splenomegaly- mild  06/14/2020   History of cannabis abuse 06/14/2020   Recurrent major depressive disorder, in partial remission 06/09/2020   Anxiety 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 06/09/2020   Alcohol use disorder, moderate, dependence (HCC) 06/09/2020   Foraminal stenosis of cervical region 05/09/2020   Diabetic polyneuropathy    ADHD (attention deficit hyperactivity disorder)    Generalized anxiety disorder    Major depressive disorder    Cervical spondylosis 09/22/2019   Hypertension associated with diabetes (HCC) 01/28/2019   Alcoholic cirrhosis of liver 06/02/2018   Type 2 diabetes mellitus with hyperglycemia 04/25/2018    Past Medical History:  Diagnosis Date   ADHD (attention deficit hyperactivity disorder)    Diagnosed during teenage years   Alcohol use disorder    On average 14-21 drinks per week   Alcoholic cirrhosis of liver    Per patient - condition is not related to moderate alcohol use but rather prolonged NSAID use and recent abuse.   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 [place]   Major depressive disorder    Other spondylosis with radiculopathy, cervical region    Retinopathy    Right eye; per patient report   Type 2 diabetes mellitus with hyperglycemia     Past Surgical History:  Procedure Laterality  Date   ANTERIOR CERVICAL DECOMPRESSION/DISCECTOMY FUSION 4 LEVELS N/A 02/13/2024   Procedure: ANTERIOR CERVICAL DECOMPRESSION/DISCECTOMY FUSION, CERVICAL FOUR - CERVICAL FIVE, CERVICAL FIVE - CERVICAL SIX, CERVICAL SIX - CERVICAL SEVEN, CERVICAL SEVEN - THORACIC ONE;  Surgeon: Colon Shove, MD;  Location: [facility] OR;  Service: Neurosurgery;  Laterality: N/A;   CHOLECYSTECTOMY N/A 06/02/2018   Procedure: LAPAROSCOPIC CHOLECYSTECTOMY;  Surgeon: Nicholaus Selinda Birmingham, MD;  Location: [facility] ORS;  Service: General;  Laterality: N/A;   KNEE ARTHROSCOPY Right    30 Years ago   lumbar five-sacral one posterior lumbar interbody fusion  01/24/2021   LUMBAR LAMINECTOMY/DECOMPRESSION MICRODISCECTOMY Bilateral 10/07/2023   Procedure: Laminectomy for Facet/Synovial Cyst - Lumbar Four-Lumbar Five - Bilateral;  Surgeon: Colon Shove, MD;  Location: [facility] OR;  Service: Neurosurgery;  Laterality: Bilateral;  C3    Family History  Problem Relation Age of Onset   Aneurysm Father    Drug abuse Sister    Multiple sclerosis Sister    Suicidality Maternal Aunt    Emphysema Maternal  Grandfather    Diabetes Neg Hx     Social History   Social History Narrative   Not on file     Objective:   BP (!) 140/80   Pulse 95   Temp (!) 97.4 F (36.3 C) (Temporal)   Ht 6' (1.829 m)   Wt 215 lb 8 oz (97.8 kg)   SpO2 96%   BMI 29.23 kg/m   GEN: No acute distress; alert,appropriate. PULM: Breathing comfortably in no respiratory distress PSYCH: Normally interactive.   Mild tenderness from T3-L5, more in the paraspinous musculature  Greatest pain is in the thoracic region  Full range of motion in the hips  Range of motion at  the waist: Flexion, extension, lateral bending and rotation: Moderate restriction of motion  No echymosis or edema Rises to examination table with mild difficulty Gait: minimally antalgic  B Ankle Dorsiflexion (L5,4): 5/5 B Great Toe Dorsiflexion (L5,4): 5/5 Heel Walk (L5): WNL Toe Walk (S1):  WNL Rise/Squat (L4): WNL, mild pain  SENSORY B Medial Foot (L4): WNL B Dorsum (L5): WNL B Lateral (S1): WNL Light Touch: WNL  B SLR, seated: Pain B SLR, supine: Pain B FABER: neg B Reverse FABER: neg B Greater Troch: NT B Log Roll: neg B Sciatic Notch: ttp  Laboratory and Imaging Data: DG Lumbar Spine Complete Result Date: 07/09/2024 CLINICAL DATA:  Severe back pain history of fusion EXAM: LUMBAR SPINE - COMPLETE 4+ VIEW COMPARISON:  12/18/2023 FINDINGS: Mild dextrocurvature. Stable lumbar alignment. Vertebral body heights are maintained. Posterior spinal fusion hardware at L5-S1 with interbody device. Multilevel degenerative osteophyte. Aortic atherosclerosis IMPRESSION: Stable appearance of posterior spinal fusion hardware at L5-S1. No acute osseous abnormality. Similar degenerative changes. Electronically Signed   By: Luke Bun M.D.   On: 07/09/2024 21:09   DG Thoracic Spine W/Swimmers Result Date: 07/09/2024 CLINICAL DATA:  Severe thoracic pain EXAM: THORACIC SPINE - 3 VIEWS COMPARISON:  CT 07/18/2020 FINDINGS: Hardware in the cervicothoracic spine. Mild motion degradation. Vertebral body heights are maintained. Moderate multilevel degenerative change of the mid to lower thoracic spine IMPRESSION: Mild motion degradation. Moderate multilevel degenerative change. Electronically Signed   By: Luke Bun M.D.   On: 07/09/2024 21:07     Assessment and Plan:     ICD-10-CM   1. Acute bilateral thoracic back pain  M54.6 DG Thoracic Spine W/Swimmers    2. Chronic bilateral low back pain without sciatica  M54.50 DG Lumbar Spine Complete   G89.29     3. History of lumbar fusion  Z98.1     4. History of fusion of cervical spine  Z98.1      Assessment & Plan Thoracic spine pain Acute thoracic spine pain, burning and sharp, with focal point. Worsens daily, exacerbated by certain positions. Increased Percocet use due to severity. Possible thoracic spine disc irritation. -  Ordered imaging of the thoracic spine.  Multilevel degenerative disc disease of the thoracic spine with an appearance of DISH - Referred to Dr. Colon for further evaluation, who is his operating surgeon and has done multiple surgeries on the patient within the last year. - I encouraged him to increase his Lyrica  dosing - Flexeril  for acute pain management - He also sees pain management clinic  Low back pain Radiates to glutes and upper leg, similar to previous sciatica. Associated with audible pop, worsened by movement. Possible sciatica or lumbar spine pathology. - Ordered imaging of the lumbar spine. - Referred to Dr. Colon for further evaluation.  Medication Management  during today's office visit: Meds ordered this encounter  Medications   cyclobenzaprine  (FLEXERIL ) 5 MG tablet    Sig: Take 1 tablet (5 mg total) by mouth 2 (two) times daily as needed for muscle spasms.    Dispense:  60 tablet    Refill:  1   pregabalin  (LYRICA ) 100 MG capsule    Sig: Take 1 capsule (100 mg total) by mouth 2 (two) times daily.    Dispense:  60 capsule    Refill:  2   Medications Discontinued During This Encounter  Medication Reason   methocarbamol  (ROBAXIN ) 500 MG tablet Duplicate   pregabalin  (LYRICA ) 100 MG capsule Reorder   cyclobenzaprine  (FLEXERIL ) 5 MG tablet Reorder    Orders placed today for conditions managed today: Orders Placed This Encounter  Procedures   DG Thoracic Spine W/Swimmers   DG Lumbar Spine Complete    Disposition: No follow-ups on file.  [employer] One speech-to-text software was used for transcription in this dictation.  Possible transcriptional errors can occur using Animal nutritionist.   Signed,  Jacques DASEN. Levina Boyack, MD   Outpatient Encounter Medications as of 07/09/2024  Medication Sig   ALPHA LIPOIC ACID PO Take 4 capsules by mouth daily with lunch.   aspirin 325 MG tablet Take 1,300 mg by mouth every 8 (eight) hours as needed. (Patient taking  differently: Take 1,300 mg by mouth every 8 (eight) hours as needed. States rotates with naproxen and ibuprofen )   BLACK CURRANT SEED OIL PO Take 1 capsule by mouth daily with lunch.   celecoxib  (CELEBREX ) 200 MG capsule Take 1 capsule (200 mg total) by mouth 2 (two) times daily.   Cholecalciferol (VITAMIN D -3 PO) Take 5,000 Units by mouth daily with lunch.   dapagliflozin  propanediol (FARXIGA ) 5 MG TABS tablet Take 1 tablet (5 mg total) by mouth daily before breakfast.   diclofenac  (VOLTAREN ) 50 MG EC tablet Take 50 mg by mouth daily as needed (pain).   diclofenac  Sodium (VOLTAREN  ARTHRITIS PAIN) 1 % GEL Apply 2 g topically 4 (four) times daily.   glucose blood (ACCU-CHEK GUIDE) test strip USE AS DIRECTED 2 TIMES DAILY   ibuprofen  (ADVIL ) 200 MG tablet Take 800-1,200 mg by mouth 2 (two) times daily as needed (pain.).   imatinib  (GLEEVEC ) 400 MG tablet Take 1 tablet (400 mg total) by mouth daily. Take with meals and large glass of water.Caution:Chemotherapy.   insulin  glargine (LANTUS  SOLOSTAR) 100 UNIT/ML Solostar Pen Inject 8 Units into the skin every morning. Discard unused portion of pen 28 days after first use.   Insulin  Pen Needle (TECHLITE PEN NEEDLES) 32G X 4 MM MISC Use to inject Victoza  once a day   MAGNESIUM COMPLEX HIGH POTENCY PO Take 1 capsule by mouth in the morning.   methocarbamol  (ROBAXIN ) 500 MG tablet Take 1 tablet (500 mg total) by mouth 3 (three) times a day as needed for muscle spasms.   Multiple Vitamins-Minerals (ALIVE MENS GUMMY MULTIVITAMINS PO) Take 4 each by mouth in the morning.   naproxen sodium (ALEVE) 220 MG tablet Take 660 mg by mouth 2 (two) times daily as needed (pain.).   oxyCODONE -acetaminophen  (PERCOCET/ROXICET) 5-325 MG tablet Take 1-2 tablets by mouth every 6 (six) hours as needed for severe pain (pain score 7-10).   TURMERIC-GINGER PO Take 3 capsules by mouth in the morning. With Garlic   zinc sulfate, 50mg  elemental zinc, 220 (50 Zn) MG capsule Take 220 mg  by mouth daily after lunch.   [DISCONTINUED] cyclobenzaprine  (FLEXERIL ) 5  MG tablet Take 1 tablet (5 mg total) by mouth 2 (two) times daily as needed for muscle spasms.   [DISCONTINUED] pregabalin  (LYRICA ) 100 MG capsule Take 1 capsule (100 mg total) by mouth 2 (two) times daily. (Patient taking differently: Take 100 mg by mouth at bedtime as needed (pain.).)   cyclobenzaprine  (FLEXERIL ) 5 MG tablet Take 1 tablet (5 mg total) by mouth 2 (two) times daily as needed for muscle spasms.   pregabalin  (LYRICA ) 100 MG capsule Take 1 capsule (100 mg total) by mouth 2 (two) times daily.   [DISCONTINUED] methocarbamol  (ROBAXIN ) 500 MG tablet Take 1 tablet (500 mg total) by mouth every 6 (six) hours as needed for muscle spasms.   No facility-administered encounter medications on file as of 07/09/2024.

## 2024-07-09 ENCOUNTER — Ambulatory Visit: Admitting: Family Medicine

## 2024-07-09 ENCOUNTER — Encounter: Payer: Self-pay | Admitting: Family Medicine

## 2024-07-09 ENCOUNTER — Ambulatory Visit (INDEPENDENT_AMBULATORY_CARE_PROVIDER_SITE_OTHER)
Admission: RE | Admit: 2024-07-09 | Discharge: 2024-07-09 | Disposition: A | Discharge status: Home / Self Care | Source: Ambulatory Visit | Attending: Family Medicine | Admitting: Family Medicine

## 2024-07-09 ENCOUNTER — Other Ambulatory Visit (HOSPITAL_COMMUNITY): Payer: Self-pay

## 2024-07-09 VITALS — BP 140/80 | HR 95 | Temp 97.4°F | Ht 72.0 in | Wt 215.5 lb

## 2024-07-09 DIAGNOSIS — G8929 Other chronic pain: Secondary | ICD-10-CM | POA: Diagnosis not present

## 2024-07-09 DIAGNOSIS — M545 Low back pain, unspecified: Secondary | ICD-10-CM | POA: Diagnosis not present

## 2024-07-09 DIAGNOSIS — M546 Pain in thoracic spine: Secondary | ICD-10-CM

## 2024-07-09 DIAGNOSIS — Z981 Arthrodesis status: Secondary | ICD-10-CM

## 2024-07-09 DIAGNOSIS — M549 Dorsalgia, unspecified: Secondary | ICD-10-CM | POA: Diagnosis not present

## 2024-07-09 DIAGNOSIS — M47814 Spondylosis without myelopathy or radiculopathy, thoracic region: Secondary | ICD-10-CM | POA: Diagnosis not present

## 2024-07-09 DIAGNOSIS — I700 Atherosclerosis of aorta: Secondary | ICD-10-CM | POA: Diagnosis not present

## 2024-07-09 MED ORDER — PREGABALIN 100 MG PO CAPS
100.0000 mg | ORAL_CAPSULE | Freq: Two times a day (BID) | ORAL | 2 refills | Status: AC
  Filled 2024-07-09: qty 60, 30d supply, fill #0
  Filled 2024-09-13 – 2024-09-18 (×2): qty 60, 30d supply, fill #1

## 2024-07-09 MED ORDER — CYCLOBENZAPRINE HCL 5 MG PO TABS
5.0000 mg | ORAL_TABLET | Freq: Two times a day (BID) | ORAL | 1 refills | Status: DC | PRN
  Filled 2024-07-09: qty 60, 30d supply, fill #0

## 2024-07-10 ENCOUNTER — Ambulatory Visit: Payer: Self-pay | Admitting: Family Medicine

## 2024-07-10 ENCOUNTER — Other Ambulatory Visit: Payer: Self-pay

## 2024-07-14 ENCOUNTER — Other Ambulatory Visit: Payer: Self-pay

## 2024-07-14 NOTE — Patient Outreach (Signed)
 Social Drivers of Health  Community Resource and Care Coordination Visit Note   07/14/2024  Name: Gerald Olson MRN: [id number] DOB:06-Feb-1966  Situation: Referral received for [facility] needs assessment and assistance related to Financial Strain  utilities. I obtained verbal consent from Patient.  Visit completed with Patient on the phone.   Background:      Assessment:   Goals Addressed             This Visit's Progress    BSW VBCI Social Work Care Plan       Problems:   Corporate Treasurer  and utilities  CSW Clinical Goal(s):   Over the next 30 days the Patient will will follow up with resources sent as directed by Social Work.  Interventions:  SW emailed resources to address on file for utilities and financial strain SW resent email to patient and it was received.  Patient Goals/Self-Care Activities:  Patient will utilize resources SW emailed  Plan:   The patient has been provided with contact information for the care management team and has been advised to call with any health related questions or concerns.         Recommendation:   Patient will use resources SW sent via email for assistance.  Follow Up Plan:   Patient has achieved all patient stated goals. [employer] will be closed. Patient has been provided contact information should new needs arise.  [name], [name], MHA [place]  [facility] Social Worker, Population Health [phone]

## 2024-07-14 NOTE — Patient Instructions (Signed)
 Visit Information  Gerald Olson was given information about Medicaid Managed Care team care coordination services as a part of their Healthy [facility] Medicaid benefit. [name]   If you would like to schedule transportation through your [facility] plan, please call the following number at least 2 days in advance of your appointment: [phone]  For information about your ride after you set it up, call Ride Assist at [phone]. Use this number to activate a Will Call pickup, or if your transportation is late for a scheduled pickup. Use this number, too, if you need to make a change or cancel a previously scheduled reservation.  If you need transportation services right away, call [phone]. The after-hours call center is staffed 24 hours to handle ride assistance and urgent reservation requests (including discharges) 365 days a year. Urgent trips include sick visits, hospital discharge requests and life-sustaining treatment.  Call the [facility] Line at [phone], at any time, 24 hours a day, 7 days a week. If you are in danger or need immediate medical attention call 911.     Patient verbalizes understanding of instructions and care plan provided today and agrees to view in MyChart. Active MyChart status and patient understanding of how to access instructions and care plan via MyChart confirmed with patient.     The Patient has been provided with contact information for the Managed Medicaid care management team and has been advised to call with any health related questions or concerns.  [name], [name], MHA [name]  [facility] Social Worker, Population Health [phone]    Following is a copy of your plan of care:  There are no care plans that you recently modified to display for this patient.

## 2024-07-17 ENCOUNTER — Other Ambulatory Visit: Payer: Self-pay

## 2024-07-20 ENCOUNTER — Other Ambulatory Visit: Payer: Self-pay

## 2024-07-20 ENCOUNTER — Other Ambulatory Visit (HOSPITAL_COMMUNITY): Payer: Self-pay

## 2024-07-20 NOTE — Patient Outreach (Signed)
 Complex Care Management   Visit Note  07/20/2024  Name:  Gerald MUNYON MRN: [id number] DOB: [birth year]  Situation: Referral received for Complex Care Management related to Chronic pain I obtained verbal consent from Patient.  Visit completed with Patient  on the phone  Background:   Past Medical History:  Diagnosis Date   ADHD (attention deficit hyperactivity disorder)    Diagnosed during teenage years   Alcohol use disorder    On average 14-21 drinks per week   Alcoholic cirrhosis of liver    Per patient - condition is not related to moderate alcohol use but rather prolonged NSAID use and recent abuse.   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 [place]   Major depressive disorder    Other spondylosis with radiculopathy, cervical region    Retinopathy    Right eye; per patient report   Type 2 diabetes mellitus with hyperglycemia     Assessment: Patient Reported Symptoms:  Cognitive Cognitive Status: Difficulties with attention and concentration Cognitive/Intellectual Conditions Management [RPT]: None reported or documented in medical history or problem list   Health Maintenance Behaviors: Annual physical exam  Neurological Oher Neurological Symptoms/Conditions [RPT]: voice significantly improved, Right hand paralyzed, left hand numbness, sciatic pain down right leg, Neurological Management Strategies: Medication therapy  HEENT HEENT Symptoms Reported: Not assessed HEENT Management Strategies: Routine screening    Cardiovascular Cardiovascular Symptoms Reported: No  symptoms reported Cardiovascular Comment: left rib pain - thinks due to sleeping position and issue with mid back, occasionally checks BP last reading elevated due to pain 156/111 before meds, with pain meds 106/54 red flags for ED and advised to speak with provider as any elevated BP causes damage  Respiratory Respiratory Symptoms Reported: No symptoms reported Respiratory Management Strategies: Routine screening  Endocrine Endocrine Symptoms Reported: No symptoms reported Is patient diabetic?: Yes Is patient checking blood sugars at home?: Yes List most recent blood sugar readings, include date and time of day: not checking regularly - ranging 125-135 - A1C 5.7, no highs/low, highest is when eats past but still <200 when checking, low -100 high-175    Gastrointestinal Gastrointestinal Symptoms Reported: Not assessed Gastrointestinal Management Strategies: Medication therapy    Genitourinary Genitourinary Symptoms Reported: Not assessed    Integumentary Integumentary Symptoms Reported: Not assessed    Musculoskeletal Musculoskelatal Symptoms Reviewed: Back pain, Difficulty walking, Joint pain, Muscle pain, Limited mobility Additional Musculoskeletal Details: reports severe pain mid back 12/10, and still low back pain down right leg, increase in flexaril and Lyrica  by provider helped some, states almost out of Percocet - advised to call Dr Ubaldo or Pain Management for refill - strongly advised against procurring street meds for pain control and using alcohol with meds as patient commented, denies falls since lst visit Musculoskeletal Management Strategies: Medication therapy, Routine screening Falls in the past year?: Yes Number of falls in past year: 2 or more Was there an injury with Fall?: No Fall Risk Category Calculator: 2 Patient Fall Risk Level: Moderate Fall Risk Patient at  Risk for Falls Due to: Impaired balance/gait, History of fall(s), Impaired mobility, Orthopedic patient,  Medication side effect Fall risk Follow up: Falls evaluation completed, Falls prevention discussed  Psychosocial Psychosocial Symptoms Reported: Depression - if selected complete PHQ 2-9 Additional Psychological Details: declined further visits with LCSW - declines assist with housing situation Behavioral Management Strategies: Activity, Coping strategies Behavioral Health Comment: CBD/THC prn for sleep if using Percocet reports does not drink will drink 6-8 beers throughout day for pain, advised against mixing alcohol and medications Major Change/Loss/Stressor/Fears (CP): Medical condition, self, Environment, Resources      07/20/2024    PHQ2-9 Depression Screening   Little interest or pleasure in doing things Nearly every day  Feeling down, depressed, or hopeless Nearly every day  PHQ-2 - Total Score 6  Trouble falling or staying asleep, or sleeping too much Nearly every day  Feeling tired or having little energy Nearly every day  Poor appetite or overeating  Nearly every day  Feeling bad about yourself - or that you are a failure or have let yourself or your family down Nearly every day  Trouble concentrating on things, such as reading the newspaper or watching television Nearly every day  Moving or speaking so slowly that other people could have noticed.  Or the opposite - being so fidgety or restless that you have been moving around a lot more than usual Not at all  Thoughts that you would be better off dead, or hurting yourself in some way Not at all (not the suicide type sometimes feels would be better off dead, not going to harm myself)  PHQ2-9 Total Score 21  If you checked off any problems, how difficult have these problems made it for you to do your work, take care of things at home, or get along with other people Very difficult  Depression Interventions/Treatment  (doing own treatment on self)    There were no vitals filed for this visit.    Medications Reviewed Today      Reviewed by Devra Lands, RN (Registered Nurse) on 07/20/24 at 1519  Med List Status: <None>   Medication Order Taking? Sig Documenting Provider Last Dose Status Informant  ALPHA LIPOIC ACID PO [id number] Yes Take 4 capsules by mouth daily with lunch. [provider]  Active Self  aspirin 325 MG tablet [id number] Yes Take 1,300 mg by mouth every 8 (eight) hours as needed.  Patient taking differently: Take 1,300 mg by mouth every 8 (eight) hours as needed. States rotates with naproxen and ibuprofen    [provider]  Active Self  BLACK CURRANT SEED OIL PO [id number] Yes Take 1 capsule by mouth daily with lunch. [provider]  Active Self  celecoxib  (CELEBREX ) 200 MG capsule [id number]  Take 1 capsule (200 mg total) by mouth 2 (two) times daily.  Patient not taking: Reported on 07/20/2024     Active   Cholecalciferol (VITAMIN D -3 PO) [id number] Yes Take 5,000 Units by mouth daily with lunch. [provider]  Active Self  cyclobenzaprine  (FLEXERIL ) 5 MG tablet [id number] Yes Take 1 tablet (5 mg total) by mouth 2 (two) times daily as needed for muscle spasms. Copland, Jacques, MD  Active   dapagliflozin  propanediol (FARXIGA ) 5 MG TABS tablet [id number]  Take 1 tablet (5 mg total) by mouth daily before breakfast.  Patient not taking: Reported on 07/20/2024   Trixie File, MD  Active   diclofenac  (VOLTAREN ) 50 MG EC tablet [id number] Yes Take 50 mg by mouth daily  as needed (pain). [provider]  Active Self  diclofenac  Sodium (VOLTAREN  ARTHRITIS PAIN) 1 % GEL [id number]  Apply 2 g topically 4 (four) times daily.  Patient not taking: Reported on 07/20/2024   [name], NP  Active   glucose blood (ACCU-CHEK GUIDE) test strip [id number] Yes USE AS DIRECTED 2 TIMES DAILY Trixie File, MD  Active Self  ibuprofen  (ADVIL ) 200 MG tablet [id number] Yes Take 800-1,200 mg by mouth 2 (two) times daily as needed (pain.). [provider]  Active  Self  imatinib  (GLEEVEC ) 400 MG tablet [id number] Yes Take 1 tablet (400 mg total) by mouth daily. Take with meals and large glass of water.Caution:Chemotherapy.  Patient taking differently: Take 400 mg by mouth daily. Take with meals and large glass of water.Caution:Chemotherapy. Missed several doses and reports will be weaning off   Finnegan, Timothy J, MD  Active   insulin  glargine (LANTUS  SOLOSTAR) 100 UNIT/ML Solostar Pen [id number] Yes Inject 8 Units into the skin every morning. Discard unused portion of pen 28 days after first use.  Patient taking differently: Inject 8 Units into the skin every morning. Discard unused portion of pen 28 days after first use. Reports takes usually 6 out of 7 days   Trixie File, MD  Active   Insulin  Pen Needle (TECHLITE PEN NEEDLES) 32G X 4 MM MISC [id number] Yes Use to inject Victoza  once a day Trixie File, MD  Active Self  MAGNESIUM COMPLEX HIGH POTENCY PO [id number] Yes Take 1 capsule by mouth in the morning. [provider]  Active Self  methocarbamol  (ROBAXIN ) 500 MG tablet [id number] Yes Take 1 tablet (500 mg total) by mouth 3 (three) times a day as needed for muscle spasms.   Active   Multiple Vitamins-Minerals (ALIVE MENS GUMMY MULTIVITAMINS PO) [id number] Yes Take 4 each by mouth in the morning. [provider]  Active Self  naproxen sodium (ALEVE) 220 MG tablet [id number] Yes Take 660 mg by mouth 2 (two) times daily as needed (pain.). [provider]  Active Self  oxyCODONE -acetaminophen  (PERCOCET/ROXICET) 5-325 MG tablet [id number] Yes Take 1-2 tablets by mouth every 6 (six) hours as needed for severe pain (pain score 7-10). [provider]  Active   pregabalin  (LYRICA ) 100 MG capsule [id number] Yes Take 1 capsule (100 mg total) by mouth 2 (two) times daily. Watt Mirza, MD  Active   TURMERIC-GINGER PO [id number]  Take 3 capsules by mouth in the morning. With Garlic  Patient not taking: Reported on 07/20/2024    [provider]  Active Self  zinc sulfate, 50mg  elemental zinc, 220 (50 Zn) MG capsule [id number] Yes Take 220 mg by mouth daily after lunch. [provider]  Active Self  Med List Note Teretha Renaee SAILOR, RPH-CPP 07/28/20 1202): Gleevec  (imatinib ) filled at [facility]            Recommendation:   PCP Follow-up  Follow Up Plan:   Telephone follow-up in 1 month  [name], MSN, RN [facility]  [facility], [facility] RN Care Manager Direct Dial : [phone] Fax: [phone]

## 2024-07-20 NOTE — Patient Instructions (Signed)
 Visit Information  [name] was given information about Medicaid Managed Care team care coordination services as a part of their Healthy [facility] Medicaid benefit. [name]   If you would like to schedule transportation through your Healthy [facility] plan, please call the following number at least 2 days in advance of your appointment: [phone]  For information about your ride after you set it up, call Ride Assist at [phone]. Use this number to activate a Will Call pickup, or if your transportation is late for a scheduled pickup. Use this number, too, if you need to make a change or cancel a previously scheduled reservation.  If you need transportation services right away, call [phone]. The after-hours call center is staffed 24 hours to handle ride assistance and urgent reservation requests (including discharges) 365 days a year. Urgent trips include sick visits, hospital discharge requests and life-sustaining treatment.  Call the [facility] Line at [phone], at any time, 24 hours a day, 7 days a week. If you are in danger or need immediate medical attention call 911.   Please see education materials related to Pain provided by MyChart link.  Care plan and visit instructions communicated with the patient verbally today. Patient agrees to receive a copy in MyChart. Active MyChart status and patient understanding of how to access instructions and care plan via MyChart confirmed with patient.     Telephone follow up appointment with Managed Medicaid care management team member scheduled for: 08/17/2024 at 1:00 pm  [name], MSN, RN [place]  [facility], [facility] RN Care Manager Direct Dial : [phone] Fax: [phone]   Acute Back Pain, Adult Acute back pain is sudden and usually short-lived. It is often caused by an injury to the muscles and tissues in the back. The injury may result from: A muscle, tendon, or  ligament getting overstretched or torn. Ligaments are tissues that connect bones to each other. Lifting something improperly can cause a back strain. Wear and tear (degeneration) of the spinal disks. Spinal disks are circular tissue that provide cushioning between the bones of the spine (vertebrae). Twisting motions, such as while playing sports or doing yard work. A hit to the back. Arthritis. You may have a physical exam, lab tests, and imaging tests to find the cause of your pain. Acute back pain usually goes away with rest and home care. Follow these instructions at home: Managing pain, stiffness, and swelling Take over-the-counter and prescription medicines only as told by your health care provider. Treatment may include medicines for pain and inflammation that are taken by mouth or applied to the skin, or muscle relaxants. Your health care provider may recommend applying ice during the first 24-48 hours after your pain starts. To do this: Put ice in a plastic bag. Place a towel between your skin and the bag. Leave the ice on for 20 minutes, 2-3 times a day. Remove the ice if your skin turns bright red. This is very important. If you cannot feel pain, heat, or cold, you have a greater risk of damage to the area. If directed, apply heat to the affected area as often as told by your health care provider. Use the heat source that your health care provider recommends, such as a moist heat pack or a heating pad. Place a towel between your skin and the heat source. Leave the heat on for 20-30 minutes. Remove the heat if your skin turns bright red. This is especially important if you are  unable to feel pain, heat, or cold. You have a greater risk of getting burned. Activity  Do not stay in bed. Staying in bed for more than 1-2 days can delay your recovery. Sit up and stand up straight. Avoid leaning forward when you sit or hunching over when you stand. If you work at a desk, sit close to it so  you do not need to lean over. Keep your chin tucked in. Keep your neck drawn back, and keep your elbows bent at a 90-degree angle (right angle). Sit high and close to the steering wheel when you drive. Add lower back (lumbar) support to your car seat, if needed. Take short walks on even surfaces as soon as you are able. Try to increase the length of time you walk each day. Do not sit, drive, or stand in one place for more than 30 minutes at a time. Sitting or standing for long periods of time can put stress on your back. Do not drive or use heavy machinery while taking prescription pain medicine. Use proper lifting techniques. When you bend and lift, use positions that put less stress on your back: [place] your knees. Keep the load close to your body. Avoid twisting. Exercise regularly as told by your health care provider. Exercising helps your back heal faster and helps prevent back injuries by keeping muscles strong and flexible. Work with a physical therapist to make a safe exercise program, as recommended by your health care provider. Do any exercises as told by your physical therapist. Lifestyle Maintain a healthy weight. Extra weight puts stress on your back and makes it difficult to have good posture. Avoid activities or situations that make you feel anxious or stressed. Stress and anxiety increase muscle tension and can make back pain worse. Learn ways to manage anxiety and stress, such as through exercise. General instructions Sleep on a firm mattress in a comfortable position. Try lying on your side with your knees slightly bent. If you lie on your back, put a pillow under your knees. Keep your head and neck in a straight line with your spine (neutral position) when using electronic equipment like smartphones or pads. To do this: Raise your smartphone or pad to look at it instead of bending your head or neck to look down. Put the smartphone or pad at the level of your face while looking at  the screen. Follow your treatment plan as told by your health care provider. This may include: Cognitive or behavioral therapy. Acupuncture or massage therapy. Meditation or yoga. Contact a health care provider if: You have pain that is not relieved with rest or medicine. You have increasing pain going down into your legs or buttocks. Your pain does not improve after 2 Yonah Tangeman. You have pain at night. You lose weight without trying. You have a fever or chills. You develop nausea or vomiting. You develop abdominal pain. Get help right away if: You develop new bowel or bladder control problems. You have unusual weakness or numbness in your arms or legs. You feel faint. These symptoms may represent a serious problem that is an emergency. Do not wait to see if the symptoms will go away. Get medical help right away. Call your local emergency services (911 in the [place] Do not drive yourself to the hospital. Summary Acute back pain is sudden and usually short-lived. Use proper lifting techniques. When you bend and lift, use positions that put less stress on your back. Take over-the-counter and prescription medicines only  as told by your health care provider, and apply heat or ice as told. This information is not intended to replace advice given to you by your health care provider. Make sure you discuss any questions you have with your health care provider. Document Revised: 11/04/2020 Document Reviewed: 11/04/2020 Elsevier Patient Education  2024 [employer].  Following is a copy of your plan of care:  There are no care plans that you recently modified to display for this patient.

## 2024-07-21 ENCOUNTER — Other Ambulatory Visit (HOSPITAL_COMMUNITY): Payer: Self-pay

## 2024-07-21 ENCOUNTER — Other Ambulatory Visit: Payer: Self-pay

## 2024-07-21 NOTE — Progress Notes (Signed)
 Specialty Pharmacy Refill Coordination Note  Gerald Olson is a 58 y.o. male contacted today regarding refills of specialty medication(s) Imatinib  Mesylate (GLEEVEC )   Patient requested Delivery   Delivery date: 07/31/24   Verified address: [address] [facility] [place] [address]   Medication will be filled on: 07/30/24

## 2024-07-21 NOTE — Progress Notes (Signed)
 Specialty Pharmacy Ongoing Clinical Assessment Note  Gerald Olson is a 58 y.o. male who is being followed by the specialty pharmacy service for RxSp Oncology   Patient's specialty medication(s) reviewed today: Imatinib  Mesylate (GLEEVEC )   Missed doses in the last 4 weeks: More than 5 (Patient reports missing about 3 doses per week, he reports that his provider is aware.  Per office visit notes from 04/09/24, his provider is aware of his compliance issues.)   Patient/Caregiver did not have any additional questions or concerns.   Therapeutic benefit summary: Patient is achieving benefit   Adverse events/side effects summary: Experienced adverse events/side effects   Patient's therapy is appropriate to: Continue    Goals Addressed             This Visit's Progress    Achieve or maintain remission   On track    Patient is on track. Patient will maintain adherence.  Per Dr. Jerone office visit notes from 04/09/24, the most recent BCR-ABL mutation from Jan 07, 2024 continue to reveal no evidence of disease.          Follow up: 3 months  [name] Specialty Pharmacist

## 2024-07-24 ENCOUNTER — Other Ambulatory Visit: Payer: Self-pay

## 2024-07-30 ENCOUNTER — Other Ambulatory Visit: Payer: Self-pay

## 2024-08-12 ENCOUNTER — Other Ambulatory Visit: Payer: Self-pay

## 2024-08-12 ENCOUNTER — Other Ambulatory Visit (HOSPITAL_COMMUNITY): Payer: Self-pay

## 2024-08-12 ENCOUNTER — Encounter: Payer: Self-pay | Admitting: General Practice

## 2024-08-13 ENCOUNTER — Encounter: Payer: Self-pay | Admitting: Pharmacist

## 2024-08-13 ENCOUNTER — Other Ambulatory Visit: Payer: Self-pay

## 2024-08-17 ENCOUNTER — Telehealth

## 2024-08-17 NOTE — Patient Outreach (Signed)
 Complex Care Management   Visit Note  08/17/2024  Name:  Gerald Olson MRN: [id number] DOB: 04-Aug-1966  Situation: Referral received for Complex Care Management related to Pain I obtained verbal consent from Patient.  Visit completed with Patient  on the phone  Background:   Past Medical History:  Diagnosis Date   ADHD (attention deficit hyperactivity disorder)    Diagnosed during teenage years   Alcohol use disorder    On average 14-21 drinks per week   Alcoholic cirrhosis of liver    Per patient - condition is not related to moderate alcohol use but rather prolonged NSAID use and recent abuse.   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 [place]   Major depressive disorder    Other spondylosis with radiculopathy, cervical region    Retinopathy    Right eye; per patient report   Type 2 diabetes mellitus with hyperglycemia     Assessment: Patient Reported Symptoms:  Cognitive Cognitive Status: Difficulties with attention and concentration (frequently off topic, repeats stories multiple times) Cognitive/Intellectual Conditions Management [RPT]: None reported or documented in medical history or problem list   Health Maintenance Behaviors: Annual physical exam  Neurological Neurological Review of Symptoms: Weakness Oher Neurological Symptoms/Conditions [RPT]: right hand remains paralyzed, right leg weakness, states worsening low back pain, said it is pinched nerve and leg gives out, reporting multiple falls in last month without injury, reports provider states hand paralyzed  and patient states wants hand funciton back, advised to focus on new leg issue due to safety concerns Neurological Management Strategies: Medication therapy, Routine screening Neurological Comment: neurosurgery appointment and nerve conduction study in January  HEENT        Cardiovascular Cardiovascular Symptoms Reported: No symptoms reported Cardiovascular Management Strategies: Routine screening Cardiovascular Comment: not checking BP recently, reports BP had been high before aware red flags for ED  Respiratory Respiratory Symptoms Reported: No symptoms reported Respiratory Management Strategies: Routine screening  Endocrine Endocrine Symptoms Reported: No symptoms reported Is patient diabetic?: Yes Is patient checking blood sugars at home?: No List most recent blood sugar readings, include date and time of day: not checking - out of supplies, denies sx high/low BG    Gastrointestinal Gastrointestinal Symptoms Reported: Not assessed      Genitourinary Genitourinary Symptoms Reported: Not assessed    Integumentary Integumentary Symptoms Reported: Not assessed    Musculoskeletal Musculoskelatal Symptoms Reviewed: Back pain, Difficulty walking, Joint pain, Limited mobility, Unsteady gait, Muscle pain Additional Musculoskeletal Details: reports using acquired Percocet for back pain into right leg that feels worse than a gunshot. states pinched nerve and leg giving out like left leg did before - advised to notify Dr Thayer office before appointment, sttes saw Dr Ubaldo and xrays completed, eports 4 falls in last month - leg giving out, using cane, declines walker, reports homein poor condition, declines assit, reports uses piles of clothes and boxes in key areas to prevent injury from falls Musculoskeletal Management Strategies: Medication therapy, Routine screening Falls in the past year?:  Yes Number of falls in past year: 2 or more Was there an injury with Fall?: No Fall Risk  Category Calculator: 2 Patient Fall Risk Level: Moderate Fall Risk Patient at Risk for Falls Due to: History of fall(s), Impaired balance/gait, Impaired mobility, Orthopedic patient Fall risk Follow up: Falls evaluation completed, Falls prevention discussed  Psychosocial Psychosocial Symptoms Reported: Depression - if selected complete PHQ 2-9 Additional Psychological Details: declies assist from LCSW/BSW, 211, family, discussed uclear how to help patient move forward, staes needs nerve study and to see neurosurgery, scheduled for 6 [place]  so these are complete for next visit, continues to drink alcohol, CBD/THC, reports did some horse trading to get percocet - advised against taking meds not prescribed by providers Behavioral Health Comment: spoke several times about patent that he needs to complete paperwork for and focused on paralyzed hand        08/17/2024    PHQ2-9 Depression Screening   Little interest or pleasure in doing things    Feeling down, depressed, or hopeless    PHQ-2 - Total Score    Trouble falling or staying asleep, or sleeping too much    Feeling tired or having little energy    Poor appetite or overeating     Feeling bad about yourself - or that you are a failure or have let yourself or your family down    Trouble concentrating on things, such as reading the newspaper or watching television    Moving or speaking so slowly that other people could have noticed.  Or the opposite - being so fidgety or restless that you have been moving around a lot more than usual    Thoughts that you would be better off dead, or hurting yourself in some way    PHQ2-9 Total Score    If you checked off any problems, how difficult have these problems made it for you to do your work, take care of things at home, or get along with other people    Depression Interventions/Treatment      There were no vitals filed for this visit.    Medications Reviewed Today   Medications were not  reviewed in this encounter     Recommendation:   PCP Follow-up Specialty provider follow-up Neurosurgery and Nerve Studies Continue Current Plan of Care  Follow Up Plan:   Telephone follow-up 6 Islay Polanco as discussed  [name], MSN, RN [facility]  [facility], [facility] RN Care Manager Direct Dial : [phone] Fax: [phone]

## 2024-08-17 NOTE — Patient Instructions (Addendum)
 Visit Information  Gerald Olson was given information about Medicaid Managed Care team care coordination services as a part of their Healthy [facility] Medicaid benefit. Gerald Olson   If you would like to schedule transportation through your Healthy [facility] plan, please call the following number at least 2 days in advance of your appointment: [phone]  For information about your ride after you set it up, call Ride Assist at [phone]. Use this number to activate a Will Call pickup, or if your transportation is late for a scheduled pickup. Use this number, too, if you need to make a change or cancel a previously scheduled reservation.  If you need transportation services right away, call [phone]. The after-hours call center is staffed 24 hours to handle ride assistance and urgent reservation requests (including discharges) 365 days a year. Urgent trips include sick visits, hospital discharge requests and life-sustaining treatment.  Call the [facility] Line at [phone], at any time, 24 hours a day, 7 days a week. If you are in danger or need immediate medical attention call 911.   Please see education materials related to Falls provided by MyChart link.  Care plan and visit instructions communicated with the patient verbally today. Patient agrees to receive a copy in MyChart. Active MyChart status and patient understanding of how to access instructions and care plan via MyChart confirmed with patient.     Telephone follow up appointment with Managed Medicaid care management team member scheduled for: 6 Gerald Olson as discussed 09/28/2024 at 1:00 pm  Gerald Duos, MSN, RN [place]  [facility], [facility] RN Care Manager Direct Dial : [phone] Fax: [phone]   Following is a copy of your plan of care:   Goals Addressed             This Visit's Progress    [facility] RN Care Plan- Chronic right arm/wrist pain /inability to use./ back pain    No change    Problems:  Chronic Disease Management support and education needs related to right arm/ back pain Financial Constraints with limited social support and reluctance to engage with housing resources and therapy.  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  Continued ETOH use - approx 4 beers daily and Vapes CBD oil - discussed affect of ETOH on liver, interaction with medications, vaping and lungs/throat  Goal: Over the next 3 months the Patient will attend all scheduled medical appointments: with provider as evidenced by patient report /chart review.         continue to work with Medical Illustrator and/or Social Worker to address care management and care coordination needs related to right arm/ wrist pain as evidenced by adherence to care management team scheduled appointments     demonstrate Ongoing adherence to prescribed treatment plan for right arm/ wrist pain as evidenced by patient report/ chart review. take all medications exactly as prescribed and will call provider for medication related questions as evidenced by patient report / chart review.      Interventions:   Evaluation of current treatment plan related to right shoulder & wrist/ arm pain, also left arm numbness at times,  self-management and patient's adherence to plan as established by provider. Patient continues to report 6 beers daily, Vapes CBD at night for sleep. Added herbal supplements. Nerve study and Neurosurgery appointments in January Discussed plans with patient for ongoing care management follow up and provided patient with direct contact information  for care management team Advised to keep follow up visit with neurologist - called Neurosurgery with patient on line - received call back to notify of change in symptoms. Called PCP with patient on line and made appointment for tomorrow regarding referral to ortho regarding Right wrist. Patient states has  transportation to appointment.  Advised to notify provider for worsening symptoms. Saw Dr Gerald Olson for back and advised to notify Dr Colon before appointment Advised to take medications as prescribed . Called pharmacy with patient on line and assisted to renew medications. Verified medications delivered. Out of multiple medications - already met with pharmacist. States will have money next month. Declines further assist/needs. Advised not to take meds not prescribed by provider    Referred to social worker due to unsafe housing conditions: mold, leaking roof, collapsed basement. Patient declines to utilize the resources provided at this time, receiving assistance with repairs from neighbor/friend. Patient reports improvement in living conditions re ceiling - help from neighbor - declined engagement with LCSW and resource agencies related to housing.  Discussed reducing ETOH due to impact on liver and interactions with meds,  reduce vaping due to impact on lungs and throat - patient declines - continues to decline, using 6-8 beers daily, CBD/THC,  Encouraged to get assist from friends/mother regarding place to live while house being repaired. Declined Aware LCSW appointment can be rescheduled when ready to talk. Agreed to LCSW appointment and discussed being open to information, patient with psych degree and frequently shares how he tries to treat himself with analysis and CBT. Appointment 05/02/23 at 2:00 pm. Met with SW and declined further follow up.  Patient to meet with BSW for utility/Financial resources 06/16/24 at 3:00 pm Declined Following patient comment -strongly advised patient to not utilize street acquired medications for pain mid back due to threat to life and health compared to side effects of percocet and instead to call prescribing provider Dr Gerald Olson or Pain Management - agreed to call today. Patient states acquired Percocet independent of provider - again advised against this  Patient  Self-Care Activities:  Attend all scheduled provider appointments Call pharmacy for medication refills 3-7 days in advance of running out of medications Call provider office for new concerns or questions  Take medications as prescribed   Keep follow up visit with neurologist and report worsening symptoms.  Keep appointment with LCSW - declining Consider reducing ETOH and marijuana use - unchanged   Plan:  Telephone follow up appointment with care management team member scheduled for:  09/28/2024 1:00 pm             Understanding Your Risk for Falls Millions of people have serious injuries from falls each year. It is important to understand your risk of falling. Talk with your health care provider about your risk and what you can do to lower it. If you do have a serious fall, make sure to tell your provider. Falling once raises your risk of falling again. How can falls affect me? Serious injuries from falls are common. These include: Broken bones, such as hip fractures. Head injuries, such as traumatic brain injuries (TBI) or concussions. A fear of falling can cause you to avoid activities and stay at home. This can make your muscles weaker and raise your risk for a fall. What can increase my risk? There are a number of risk factors that increase your risk for falling. The more risk factors you have, the higher your risk of falling. Serious injuries from a fall  happen most often to people who are older than 58 years old. Teenagers and young adults ages 56-29 are also at higher risk. Common risk factors include: Weakness in the lower body. Being generally weak or confused due to long-term (chronic) illness. Dizziness or balance problems. Poor vision. Medicines that cause dizziness or drowsiness. These may include: Medicines for your blood pressure, heart, anxiety, insomnia, or swelling (edema). Pain medicines. Muscle relaxants. Other risk factors include: Drinking alcohol. Having  had a fall in the past. Having foot pain or wearing improper footwear. Working at a dangerous job. Having any of the following in your home: Tripping hazards, such as floor clutter or loose rugs. Poor lighting. Pets. Having dementia or memory loss. What actions can I take to lower my risk of falling?     Physical activity Stay physically fit. Do strength and balance exercises. Consider taking a regular class to build strength and balance. Yoga and tai chi are good options. Vision Have your eyes checked every year and your prescription for glasses or contacts updated as needed. Shoes and walking aids Wear non-skid shoes. Wear shoes that have rubber soles and low heels. Do not wear high heels. Do not walk around the house in socks or slippers. Use a cane or walker as told by your provider. Home safety Attach secure railings on both sides of your stairs. Install grab bars for your bathtub, shower, and toilet. Use a non-skid mat in your bathtub or shower. Attach bath mats securely with double-sided, non-slip rug tape. Use good lighting in all rooms. Keep a flashlight near your bed. Make sure there is a clear path from your bed to the bathroom. Use night-lights. Do not use throw rugs. Make sure all carpeting is taped or tacked down securely. Remove all clutter from walkways and stairways, including extension cords. Repair uneven or broken steps and floors. Avoid walking on icy or slippery surfaces. Walk on the grass instead of on icy or slick sidewalks. Use ice melter to get rid of ice on walkways in the winter. Use a cordless phone. Questions to ask your health care provider Can you help me check my risk for a fall? Do any of my medicines make me more likely to fall? Should I take a vitamin D  supplement? What exercises can I do to improve my strength and balance? Should I make an appointment to have my vision checked? Do I need a bone density test to check for weak bones  (osteoporosis)? Would it help to use a cane or a walker? Where to find more information Centers for Disease Control and Prevention, STEADI: tonerpromos.no Community-Based Fall Prevention Programs: tonerpromos.no [employer] on Aging: baseringtones.pl Contact a health care provider if: You fall at home. You are afraid of falling at home. You feel weak, drowsy, or dizzy. This information is not intended to replace advice given to you by your health care provider. Make sure you discuss any questions you have with your health care provider. Document Revised: 04/16/2022 Document Reviewed: 04/16/2022 Elsevier Patient Education  2024 [employer].   Fall Prevention in the Home, Adult Falls can cause injuries and can happen to people of all ages. There are many things you can do to make your home safer and to help prevent falls. What actions can I take to prevent falls? General information Use good lighting in all rooms. Make sure to: Replace any light bulbs that burn out. Turn on the lights in dark areas and use night-lights. Keep items that you  use often in easy-to-reach places. Lower the shelves around your home if needed. Move furniture so that there are clear paths around it. Do not use throw rugs or other things on the floor that can make you trip. If any of your floors are uneven, fix them. Add color or contrast paint or tape to clearly mark and help you see: Grab bars or handrails. First and last steps of staircases. Where the edge of each step is. If you use a ladder or stepladder: Make sure that it is fully opened. Do not climb a closed ladder. Make sure the sides of the ladder are locked in place. Have someone hold the ladder while you use it. Know where your pets are as you move through your home. What can I do in the bathroom?     Keep the floor dry. Clean up any water on the floor right away. Remove soap buildup in the bathtub or shower. Buildup makes bathtubs and showers  slippery. Use non-skid mats or decals on the floor of the bathtub or shower. Attach bath mats securely with double-sided, non-slip rug tape. If you need to sit down in the shower, use a non-slip stool. Install grab bars by the toilet and in the bathtub and shower. Do not use towel bars as grab bars. What can I do in the bedroom? Make sure that you have a light by your bed that is easy to reach. Do not use any sheets or blankets on your bed that hang to the floor. Have a firm chair or bench with side arms that you can use for support when you get dressed. What can I do in the kitchen? Clean up any spills right away. If you need to reach something above you, use a step stool with a grab bar. Keep electrical cords out of the way. Do not use floor polish or wax that makes floors slippery. What can I do with my stairs? Do not leave anything on the stairs. Make sure that you have a light switch at the top and the bottom of the stairs. Make sure that there are handrails on both sides of the stairs. Fix handrails that are broken or loose. Install non-slip stair treads on all your stairs if they do not have carpet. Avoid having throw rugs at the top or bottom of the stairs. Choose a carpet that does not hide the edge of the steps on the stairs. Make sure that the carpet is firmly attached to the stairs. Fix carpet that is loose or worn. What can I do on the outside of my home? Use bright outdoor lighting. Fix the edges of walkways and driveways and fix any cracks. Clear paths of anything that can make you trip, such as tools or rocks. Add color or contrast paint or tape to clearly mark and help you see anything that might make you trip as you walk through a door, such as a raised step or threshold. Trim any bushes or trees on paths to your home. Check to see if handrails are loose or broken and that both sides of all steps have handrails. Install guardrails along the edges of any raised decks and  porches. Have leaves, snow, or ice cleared regularly. Use sand, salt, or ice melter on paths if you live where there is ice and snow during the winter. Clean up any spills in your garage right away. This includes grease or oil spills. What other actions can I take? Review your medicines with  your doctor. Some medicines can cause dizziness or changes in blood pressure, which increase your risk of falling. Wear shoes that: Have a low heel. Do not wear high heels. Have rubber bottoms and are closed at the toe. Feel good on your feet and fit well. Use tools that help you move around if needed. These include: Canes. Walkers. Scooters. Crutches. Ask your doctor what else you can do to help prevent falls. This may include seeing a physical therapist to learn to do exercises to move better and get stronger. Where to find more information Centers for Disease Control and Prevention, STEADI: tonerpromos.no [employer] on Aging: baseringtones.pl National Institute on Aging: baseringtones.pl Contact a doctor if: You are afraid of falling at home. You feel weak, drowsy, or dizzy at home. You fall at home. Get help right away if you: Lose consciousness or have trouble moving after a fall. Have a fall that causes a head injury. These symptoms may be an emergency. Get help right away. Call 911. Do not wait to see if the symptoms will go away. Do not drive yourself to the hospital. This information is not intended to replace advice given to you by your health care provider. Make sure you discuss any questions you have with your health care provider. Document Revised: 04/16/2022 Document Reviewed: 04/16/2022 Elsevier Patient Education  2024 [employer].

## 2024-08-18 ENCOUNTER — Other Ambulatory Visit: Payer: Self-pay

## 2024-08-19 ENCOUNTER — Other Ambulatory Visit: Payer: Self-pay

## 2024-08-24 ENCOUNTER — Other Ambulatory Visit (HOSPITAL_COMMUNITY): Payer: Self-pay

## 2024-08-26 ENCOUNTER — Other Ambulatory Visit: Payer: Self-pay

## 2024-08-28 ENCOUNTER — Other Ambulatory Visit: Payer: Self-pay

## 2024-08-31 ENCOUNTER — Other Ambulatory Visit: Payer: Self-pay

## 2024-08-31 MED ORDER — CYCLOBENZAPRINE HCL 5 MG PO TABS
5.0000 mg | ORAL_TABLET | Freq: Every evening | ORAL | 1 refills | Status: AC | PRN
  Filled 2024-09-13 – 2024-09-18 (×2): qty 45, 22d supply, fill #0

## 2024-08-31 MED ORDER — METHOCARBAMOL 500 MG PO TABS
500.0000 mg | ORAL_TABLET | Freq: Three times a day (TID) | ORAL | 1 refills | Status: AC | PRN
  Filled 2024-09-13 – 2024-09-18 (×2): qty 90, 30d supply, fill #0

## 2024-09-02 ENCOUNTER — Encounter: Admitting: Neurology

## 2024-09-09 ENCOUNTER — Ambulatory Visit: Admitting: Neurology

## 2024-09-09 VITALS — BP 160/93

## 2024-09-09 DIAGNOSIS — G6289 Other specified polyneuropathies: Secondary | ICD-10-CM

## 2024-09-09 DIAGNOSIS — R29898 Other symptoms and signs involving the musculoskeletal system: Secondary | ICD-10-CM | POA: Diagnosis not present

## 2024-09-09 DIAGNOSIS — G629 Polyneuropathy, unspecified: Secondary | ICD-10-CM | POA: Insufficient documentation

## 2024-09-09 NOTE — Progress Notes (Signed)
 "  Chief Complaint  Patient presents with   Follow-up    Pt in EMG room 3. Alone. Here for NCS.       ASSESSMENT AND PLAN  Gerald Olson is a 59 y.o. male   Long history of diabetes, diabetic peripheral neuropathy History of ACDF C4 7 by Dr. 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  There was no clear findings to explain his complaints of profound right hand weakness, he has variable effort on examination     DIAGNOSTIC DATA (LABS, IMAGING, TESTING) - I reviewed patient records, labs, notes, testing and imaging myself where available.   MEDICAL HISTORY:  Gerald Olson is a 59 year old male, seen in request by Dr. Rosemarie for EMG nerve conduction study of his right arm weakness   History is obtained from the patient and review of electronic medical records. I personally reviewed pertinent available imaging films in PACS.   PMHx of  Diabetes-insulin -dependent   He developed neck pain radiating pain to right upper extremity in December 2024, then began to notice right hand weakness in January 2025,  In February 2025 he had lumbar decompression surgery, following that he noticed worsening right hand weakness  He had ACDF C4-7 by Dr. Colon in June 2025, but did not help his right hand weakness, he described profound difficulty using right 3rd-5th finger,  Presurgical MRI of cervical spine per Dr. 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 Colon in 2022,  EMG nerve conduction study in February 2021 by Dr. 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  He has been on disability for few years due to his low back pain, neuropathy  Lab in 2025: A1c 5.02 June 2024, hemoglobin of 11.6, normal CMP with exception of mildly decreased calcium 8.8,   Patient was noted to have variable effort at today's examination, EMG nerve conduction study September 09, 2024 showed severe length-dependent axonal sensorimotor polyneuropathy, much progressed compared to previous study in 2022, in addition, there is evidence of moderate bilateral carpal tunnel syndromes, mild bilateral ulnar neuropathy across the elbow, but there was no clear explanation for his complaints of persistent right 3rd through 5th finger weakness.  UPDATE Sep 09 2024:  PHYSICAL EXAM:   Vitals:   09/09/24 1013  BP: (!) 160/93   PHYSICAL EXAMNIATION:  Gen: NAD, conversant, well nourised, well groomed                     Cardiovascular: Regular rate rhythm, no peripheral edema, warm, nontender. Eyes: Conjunctivae clear without exudates or hemorrhage Neck: Supple, no carotid bruits. Pulmonary: Clear to auscultation bilaterally   NEUROLOGICAL EXAM:  MENTAL STATUS: Speech/cognition: Awake, alert, oriented to history taking and casual conversation CRANIAL NERVES: CN II: Visual fields are full to confrontation. Pupils are round equal and briskly reactive to light. CN III, IV, VI: extraocular movement are  normal. No ptosis. CN V: Facial sensation is intact to light touch CN VII: Face is symmetric with normal eye closure  CN VIII: Hearing is normal to causal conversation. CN IX, X: Phonation is normal. CN XI: Head turning and shoulder shrug are intact  MOTOR: Variable effort on examination,  I did not see significant to right hand muscle weakness or atrophy,  REFLEXES: Reflexes are 2 and symmetric at the biceps, triceps, trace at knees, and at at ankles. Plantar responses are flexor.  SENSORY: Hair light receding to midshin level, length-dependent decreased light touch pinprick vibratory sensation to midshin  COORDINATION: There is no trunk or limb dysmetria noted.  GAIT/STANCE: Posture is normal. Gait is steady   REVIEW OF SYSTEMS:  Full 14 system review of systems performed and notable only for as above All other review of systems were negative.   ALLERGIES: Allergies[1]  HOME MEDICATIONS: Current Outpatient Medications  Medication Sig Dispense Refill   ALPHA LIPOIC ACID PO Take 4 capsules by mouth daily with lunch.     aspirin 325 MG tablet Take 1,300 mg by mouth every 8 (eight) hours as needed.     BLACK CURRANT SEED OIL PO Take 1 capsule by mouth daily with lunch.     celecoxib  (CELEBREX ) 200 MG capsule Take 1 capsule (200 mg total) by mouth 2 (two) times daily. (Patient taking differently: Take 200 mg by mouth 2 (two) times daily. Takes occasionally) 60 capsule 0   Cholecalciferol (VITAMIN D -3 PO) Take 5,000 Units by mouth daily with lunch.     cyclobenzaprine  (FLEXERIL ) 5 MG tablet Take 1 tablet (5 mg total) by mouth 2 (two) times daily as needed for muscle spasms. 60 tablet 1   cyclobenzaprine  (FLEXERIL ) 5 MG tablet Take 1-2 tablets (5-10 mg total) by mouth at bedtime as needed for muscle spasms. Not to be taken at the same time as METHOCARBAMOL . 45 tablet 1   dapagliflozin  propanediol (FARXIGA ) 5 MG TABS tablet Take 1 tablet (5 mg total) by mouth daily before breakfast. 90 tablet 3   diclofenac  (VOLTAREN ) 50 MG EC tablet Take 50 mg by mouth daily as needed (pain).     diclofenac  Sodium (VOLTAREN  ARTHRITIS PAIN) 1 % GEL Apply 2 g topically 4 (four) times daily. 150 g 1   glucose blood (ACCU-CHEK GUIDE) test strip USE AS DIRECTED 2 TIMES DAILY 180 strip 0    ibuprofen  (ADVIL ) 200 MG tablet Take 800-1,200 mg by mouth 2 (two) times daily as needed (pain.).     imatinib  (GLEEVEC ) 400 MG tablet Take 1 tablet (400 mg total) by mouth daily. Take with meals and large glass of water.Caution:Chemotherapy. (Patient taking differently: Take 400 mg by mouth daily. Take with meals and large glass of water.Caution:Chemotherapy. Missed several doses and reports will be weaning off Taking every other day - expects to be taken off in FEB) 30 tablet 5   insulin  glargine (LANTUS  SOLOSTAR) 100 UNIT/ML Solostar Pen Inject 8 Units into the skin every morning. Discard unused portion of pen 28 days after first use. (Patient taking differently: Inject 8 Units into the skin every morning. Discard unused portion of pen 28 days after first use. Reports takes usually 6 out of 7 days) 15 mL 3   Insulin  Pen Needle (TECHLITE PEN NEEDLES) 32G X 4 MM MISC Use to inject Victoza  once a day 100 each 11   MAGNESIUM COMPLEX HIGH POTENCY PO Take 1 capsule by mouth in the morning. (Patient taking differently: Take 1 capsule by  mouth in the morning. Every few day, running low)     methocarbamol  (ROBAXIN ) 500 MG tablet Take 1 tablet (500 mg total) by mouth 3 (three) times daily as needed for muscle spasms. Not to be taken at the same time as CYCLOBENZAPRINE  . 90 tablet 1   Multiple Vitamins-Minerals (ALIVE MENS GUMMY MULTIVITAMINS PO) Take 4 each by mouth in the morning. (Patient taking differently: Take 1 each by mouth in the morning. Taking every rfew days - running out)     naproxen sodium (ALEVE) 220 MG tablet Take 660 mg by mouth 2 (two) times daily as needed (pain.).     oxyCODONE -acetaminophen  (PERCOCET/ROXICET) 5-325 MG tablet Take 1-2 tablets by mouth every 6 (six) hours as needed for severe pain (pain score 7-10). (Patient taking differently: Take 1-2 tablets by mouth every 6 (six) hours as needed for severe pain (pain score 7-10). Report acquired Percocet 7.5 mg)     pregabalin  (LYRICA ) 100  MG capsule Take 1 capsule (100 mg total) by mouth 2 (two) times daily. 60 capsule 2   TURMERIC-GINGER PO Take 3 capsules by mouth in the morning. With Garlic     zinc sulfate, 50mg  elemental zinc, 220 (50 Zn) MG capsule Take 220 mg by mouth daily after lunch. (Patient taking differently: Take 220 mg by mouth daily after lunch. Takes every few days)     No current facility-administered medications for this visit.    PAST MEDICAL HISTORY: Past Medical History:  Diagnosis Date   ADHD (attention deficit hyperactivity disorder)    Diagnosed during teenage years   Alcohol use disorder    On average 14-21 drinks per week   Alcoholic cirrhosis of liver    Per patient - condition is not related to moderate alcohol use but rather prolonged NSAID use and recent abuse.   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 [place]   Major depressive disorder    Other spondylosis with radiculopathy, cervical region    Retinopathy    Right eye; per patient report   Type 2 diabetes mellitus with hyperglycemia     PAST SURGICAL HISTORY: Past Surgical History:  Procedure Laterality Date   ANTERIOR CERVICAL DECOMPRESSION/DISCECTOMY FUSION 4 LEVELS N/A 02/13/2024   Procedure: ANTERIOR CERVICAL DECOMPRESSION/DISCECTOMY FUSION, CERVICAL FOUR - CERVICAL FIVE, CERVICAL FIVE - CERVICAL SIX, CERVICAL SIX - CERVICAL SEVEN, CERVICAL SEVEN - THORACIC ONE;  Surgeon: Colon Shove, MD;  Location: [facility] OR;  Service: Neurosurgery;  Laterality: N/A;   CHOLECYSTECTOMY N/A 06/02/2018   Procedure: LAPAROSCOPIC  CHOLECYSTECTOMY;  Surgeon: Nicholaus Selinda Birmingham, MD;  Location: [facility] ORS;  Service: General;  Laterality: N/A;   KNEE ARTHROSCOPY Right    30 Years ago   lumbar five-sacral one posterior lumbar interbody fusion  01/24/2021   LUMBAR LAMINECTOMY/DECOMPRESSION MICRODISCECTOMY Bilateral 10/07/2023   Procedure: Laminectomy for Facet/Synovial Cyst - Lumbar Four-Lumbar Five - Bilateral;  Surgeon: Colon Shove, MD;  Location: [facility] OR;  Service: Neurosurgery;  Laterality: Bilateral;  C3    FAMILY HISTORY: Family History  Problem  Relation Age of Onset   Aneurysm Father    Drug abuse Sister    Multiple sclerosis Sister    Suicidality Maternal Aunt    Emphysema Maternal Grandfather    Diabetes Neg Hx     SOCIAL HISTORY: Social History   Socioeconomic History   Marital status: Single    Spouse name: Not on file   Number of children: 0   Years of education: 18   Highest education level: Master's degree (e.g., MA, MS, MEng, MEd, MSW, MBA)  Occupational History   Not on file  Tobacco Use   Smoking status: Former    Current packs/day: 0.00    Types: Cigarettes    Quit date: 06/25/2003    Years since quitting: 21.2   Smokeless tobacco: Never   Tobacco comments:    Currently vapes CBD oil  Vaping Use   Vaping status: Every Day   Substances: CBD  Substance and Sexual Activity   Alcohol use: Not Currently    Alcohol/week: 42.0 - 56.0 standard drinks of alcohol    Types: 42 - 56 Cans of beer per week    Comment: 6-8 beers per day   Drug use: Yes    Types: Marijuana    Comment: twice/week to help sleep; edibles, does not smoke   Sexual activity: Yes  Other Topics Concern   Not on file  Social History Narrative   Not on file   Social Drivers of Health   Tobacco Use: Medium Risk (08/31/2024)   Received from [facility]   Patient History    Smoking Tobacco Use: Former    Smokeless Tobacco Use: Former    Passive Exposure: Past  Physicist, Medical Strain: High Risk (06/16/2024)    Overall Financial Resource Strain (CARDIA)    Difficulty of Paying Living Expenses: Hard  Food Insecurity: Food Insecurity Present (08/17/2024)   Epic    Worried About Programme Researcher, Broadcasting/film/video in the Last Year: Sometimes true    Ran Out of Food in the Last Year: Never true  Transportation Needs: No Transportation Needs (06/16/2024)   Epic    Lack of Transportation (Medical): No    Lack of Transportation (Non-Medical): No  Physical Activity: Insufficiently Active (03/25/2023)   Exercise Vital Sign    Days of Exercise per Week: 3 days    Minutes of Exercise per Session: 10 min  Stress: Stress Concern Present (03/18/2023)   [employer] of Occupational Health - Occupational Stress Questionnaire    Feeling of Stress : To some extent  Social Connections: Socially Isolated (05/24/2023)   Social Connection and Isolation Panel    Frequency of Communication with Friends and Family: More than three times a week    Frequency of Social Gatherings with Friends and Family: More than three times a week    Attends Religious Services: Never    Database Administrator or Organizations: No    Attends Banker Meetings: Never    Marital Status: Never married  Intimate Partner Violence: Not At Risk (05/13/2024)   Epic    Fear of Current or Ex-Partner: No    Emotionally Abused: No    Physically Abused: No    Sexually Abused: No  Depression (PHQ2-9): High Risk (07/20/2024)   Depression (PHQ2-9)    PHQ-2 Score: 21  Alcohol Screen: Medium Risk (05/01/2024)   Alcohol Screen    Last Alcohol Screening Score (AUDIT): 12  Housing: Low Risk (07/20/2024)   Epic    Unable to Pay  for Housing in the Last Year: No    Number of Times Moved in the Last Year: 0    Homeless in the Last Year: No  Utilities: Not At Risk (06/16/2024)   Epic    Threatened with loss of utilities: No  Health Literacy: Adequate Health Literacy (03/25/2023)   B1300 Health Literacy    Frequency of need for help with medical  instructions: Never      Modena Callander, M.D. Ph.D.  [facility] [address], [address] [place], [place] [address] Ph: [phone] Fax: [phone]  CC:  [name], NP [address] [place],  [place] [address]  [name], NP       [1] No Known Allergies  "

## 2024-09-09 NOTE — Procedures (Signed)
 "         Full Name: Gerald Olson Gender: Male MRN #: [id number] Date of Birth: 1965/12/10    Visit Date: 09/09/2024 11:28 Age: 59 Years Examining Physician: Onita Duos Referring Physician: Rosemarie Height: 5 feet 11 inch History: 59 year old right-handed male complains of persistent right hand weakness,   Summary of the test: Nerve conduction study: Left sural, superficial peroneal sensory responses were absent. Left tibial motor responses were absent.  Left peroneal to EDB motor response showed significantly decreased CMAP amplitude.  Right median sensory response was absent.  Left median sensory response showed moderately prolonged peak latency, within normal range snap amplitude.  Bilateral ulnar sensory responses were within normal limit.  Bilateral radial sensory responses were within the low normal range  Bilateral ulnar motor responses were normal.  Bilateral median motor responses showed moderately to severely prolonged distal latency, preserved CMAP amplitude.  Electromyography: Selected needle examinations were performed at bilateral upper extremity muscles, bilateral cervical paraspinal muscles, left lower extremity muscles, left lumbosacral paraspinal muscles.   Conclusion: This is an abnormal study.  There is electrodiagnostic evidence of moderately severe axonal sensorimotor polyneuropathy.  There is also evidence of moderate bilateral distal median neuropathy across the wrist consistent with moderate bilateral carpal tunnel syndromes, with superimposed mild left chronic left C7 radiculopathy.  There is no findings to explain his complaints of profound right hand weakness.    ------------------------------- Duos Onita. M.D. Ph.D.   [facility] [address], [address] [place], [place] [address] Tel: [phone] Fax: [phone]  Verbal informed consent was obtained from the patient, patient was informed of potential risk of procedure, including  bruising, bleeding, hematoma formation, infection, muscle weakness, muscle pain, numbness, among others.        MNC    Nerve / Sites Muscle Latency Ref. Amplitude Ref. Rel Amp Segments Distance Velocity Ref. Area    ms ms mV mV %  cm m/s m/s mVms  R Median - APB     Wrist APB 6.0 <=4.4 4.1 >=4.0 100 Wrist - APB 7   14.7     Upper arm APB 10.1  3.2  78.3 Upper arm - Wrist 25 61 >=49 15.3  L Median - APB     Wrist APB 5.1 <=4.4 6.5 >=4.0 100 Wrist - APB 7   21.3     Upper arm APB 9.9  6.2  94.9 Upper arm - Wrist 28 59 >=49 19.7  R Ulnar - ADM     Wrist ADM 3.4 <=3.3 9.9 >=6.0 100 Wrist - ADM 7   23.6     B.Elbow ADM 6.3  9.2  92.6 B.Elbow - Wrist 16 56 >=49 22.7     A.Elbow ADM 9.8  5.0  54.3 A.Elbow - B.Elbow 15 43 >=49 13.4  L Ulnar - ADM     Wrist ADM 3.2 <=3.3 7.7 >=6.0 100 Wrist - ADM 7   14.0     B.Elbow ADM 6.0  7.3  93.6 B.Elbow - Wrist 18 63 >=49 17.4     A.Elbow ADM 9.1  5.1  70 A.Elbow - B.Elbow 14 45 >=49 13.0  L Peroneal - EDB     Ankle EDB 6.8 <=6.5 0.6 >=2.0 100 Ankle - EDB 9   2.5     Fib head EDB 16.3  0.6  104 Fib head - Ankle 35 37 >=44 0.4     Pop fossa EDB 19.8  0.5  80.3 Pop fossa - Fib head 10 29 >=44  0.3         Pop fossa - Ankle      L Tibial - AH     Ankle AH NR <=5.8 NR >=4.0 NR Ankle - AH 9   NR                 SNC    Nerve / Sites Rec. Site Peak Lat Ref.  Amp Ref. Segments Distance    ms ms V V  cm  R Radial - Anatomical snuff box (Forearm)     Forearm Wrist 2.7 <=2.9 11 >=15 Forearm - Wrist 10  L Radial - Anatomical snuff box (Forearm)     Forearm Wrist 2.3 <=2.9 16 >=15 Forearm - Wrist 10  L Sural - Ankle (Calf)     Calf Ankle NR <=4.4 NR >=6 Calf - Ankle 14  L Superficial peroneal - Ankle     Lat leg Ankle NR <=4.4 NR >=6 Lat leg - Ankle 14  R Median - Orthodromic (Dig II, Mid palm)     Dig II Wrist NR <=3.4 NR >=10 Dig II - Wrist 13  L Median - Orthodromic (Dig II, Mid palm)     Dig II Wrist 4.1 <=3.4 12 >=10 Dig II - Wrist 13  R Ulnar -  Orthodromic, (Dig V, Mid palm)     Dig V Wrist 3.3 <=3.1 4 >=5 Dig V - Wrist 11  L Ulnar - Orthodromic, (Dig V, Mid palm)     Dig V Wrist 2.9 <=3.1 5 >=5 Dig V - Wrist 49                     F  Wave    Nerve F Lat Ref.   ms ms  R Median - APB 32.3 <=31.0  L Ulnar - ADM 33.2 <=32.0         EMG Summary Table    Spontaneous MUAP Recruitment  Muscle IA Fib PSW Fasc Other Amp Dur. Poly Pattern  R. First dorsal interosseous Normal None None None _______ Normal Normal Normal Normal  R. Abductor digiti minimi (manus) Normal None None None _______ Normal Normal Normal Normal  R. Pronator teres Normal None None None _______ Normal Normal Normal Normal  R. Triceps brachii Normal None None None _______ Normal Normal Normal Normal  R. Deltoid Normal None None None _______ Normal Normal Normal Normal  R. Biceps brachii Normal None None None _______ Normal Normal Normal Normal  R. Extensor digitorum communis Normal None None None _______ Normal Normal Normal Normal  R. Cervical Paraspinals Normal None None None _______ Normal Normal  Normal Normal  L. Cervical paraspinals Normal None None None _______ Normal Normal Normal Normal  L. First dorsal interosseous Normal None None None _______ Normal Normal Normal Normal  L. Pronator teres Normal None None None _______ Normal Normal Normal Normal  L. Triceps brachii Normal None None None _______ Normal Normal Normal Reduced  L. Deltoid Normal None None None _______ Normal Normal Normal Normal  L. Tibialis anterior Normal None None None _______ Normal Normal Normal Normal  L. Tibialis posterior Normal None None None _______ Normal Normal Normal Normal  L. Gastrocnemius (Medial head) Normal None None None _______ Normal Normal Normal Normal  L. Vastus lateralis Normal None None None _______ Normal Normal Normal Normal  L. Lumbar paraspinals (mid) Normal None None None _______ Normal Normal Normal Normal  L. Lumbar paraspinals (low) Normal None None None  _______ Normal Normal Normal Normal     "

## 2024-09-14 ENCOUNTER — Other Ambulatory Visit: Payer: Self-pay

## 2024-09-15 ENCOUNTER — Ambulatory Visit: Payer: Self-pay | Admitting: Neurology

## 2024-09-15 NOTE — Telephone Encounter (Addendum)
 Called pt and let him know the below EMG Results per Dr. Onita and Dr. Rosemarie. Pt wants to know what is the treatment plan now that we have results.   Pt wants Dr. Rosemarie to know that he has surgery with Dr. Hope on his lower spine soon.   ----- Message from Eather Rosemarie, MD sent at 09/15/2024  8:40 AM EST ----- Kindly inform the patient that EMG nerve conduction study confirms severe peripheral neuropathy likely related to her diabetes.  She also has evidence of bilateral pinched nerve of both median nerve  at the wrist may be contributing to her hand.  I recommend she avoid activities which involve rapid repetitive wrist flexion.  Try wearing a wrist splint which is available over-the-counter at any  pharmacy and see if it helps

## 2024-09-15 NOTE — Progress Notes (Signed)
 Kindly inform the patient that EMG nerve conduction study confirms severe peripheral neuropathy likely related to her diabetes.  She also has evidence of bilateral pinched nerve of both median nerve at the wrist may be contributing to her hand.  I recommend she avoid activities which involve rapid repetitive wrist flexion.  Try wearing a wrist splint which is available over-the-counter at any pharmacy and see if it helps

## 2024-09-17 ENCOUNTER — Other Ambulatory Visit: Payer: Self-pay

## 2024-09-17 ENCOUNTER — Other Ambulatory Visit: Payer: Self-pay | Admitting: Neurological Surgery

## 2024-09-18 ENCOUNTER — Other Ambulatory Visit (HOSPITAL_COMMUNITY): Payer: Self-pay

## 2024-09-18 ENCOUNTER — Other Ambulatory Visit: Payer: Self-pay

## 2024-09-22 NOTE — Telephone Encounter (Signed)
 Called pt and let him know the below message per Dr. Rosemarie

## 2024-09-28 ENCOUNTER — Telehealth: Payer: Self-pay

## 2024-10-02 ENCOUNTER — Other Ambulatory Visit (HOSPITAL_COMMUNITY): Payer: Self-pay

## 2024-10-02 ENCOUNTER — Other Ambulatory Visit: Payer: Self-pay

## 2024-10-02 MED ORDER — OXYCODONE-ACETAMINOPHEN 5-325 MG PO TABS
1.0000 | ORAL_TABLET | Freq: Three times a day (TID) | ORAL | 0 refills | Status: AC | PRN
  Filled 2024-10-02: qty 30, 10d supply, fill #0

## 2024-10-12 ENCOUNTER — Other Ambulatory Visit

## 2024-10-20 ENCOUNTER — Ambulatory Visit (HOSPITAL_COMMUNITY): Admission: RE | Admit: 2024-10-20 | Source: Home / Self Care | Admitting: Neurological Surgery

## 2024-10-20 ENCOUNTER — Encounter (HOSPITAL_COMMUNITY): Admission: RE | Payer: Self-pay | Source: Home / Self Care

## 2024-10-21 ENCOUNTER — Telehealth: Payer: Self-pay

## 2024-10-26 ENCOUNTER — Telehealth: Admitting: Oncology

## 2024-11-09 ENCOUNTER — Inpatient Hospital Stay

## 2024-12-01 ENCOUNTER — Inpatient Hospital Stay: Admitting: Oncology

## 2024-12-03 ENCOUNTER — Ambulatory Visit: Admitting: Internal Medicine

## 2025-01-04 ENCOUNTER — Ambulatory Visit: Admitting: General Practice
